# Patient Record
Sex: Female | Born: 1965 | Race: Black or African American | Hispanic: No | Marital: Married | Smoking: Current every day smoker
Health system: Southern US, Community
[De-identification: ages and names within clinical notes are randomized; demographics above are authoritative.]

## PROBLEM LIST (undated history)

## (undated) DIAGNOSIS — N189 Chronic kidney disease, unspecified: Secondary | ICD-10-CM

## (undated) DIAGNOSIS — I639 Cerebral infarction, unspecified: Secondary | ICD-10-CM

## (undated) DIAGNOSIS — E7800 Pure hypercholesterolemia, unspecified: Secondary | ICD-10-CM

## (undated) DIAGNOSIS — D219 Benign neoplasm of connective and other soft tissue, unspecified: Secondary | ICD-10-CM

## (undated) DIAGNOSIS — K219 Gastro-esophageal reflux disease without esophagitis: Secondary | ICD-10-CM

## (undated) DIAGNOSIS — Z87442 Personal history of urinary calculi: Secondary | ICD-10-CM

## (undated) DIAGNOSIS — R569 Unspecified convulsions: Secondary | ICD-10-CM

## (undated) DIAGNOSIS — D649 Anemia, unspecified: Secondary | ICD-10-CM

## (undated) DIAGNOSIS — R112 Nausea with vomiting, unspecified: Secondary | ICD-10-CM

## (undated) DIAGNOSIS — R011 Cardiac murmur, unspecified: Secondary | ICD-10-CM

## (undated) DIAGNOSIS — I82409 Acute embolism and thrombosis of unspecified deep veins of unspecified lower extremity: Secondary | ICD-10-CM

## (undated) DIAGNOSIS — Z9889 Other specified postprocedural states: Secondary | ICD-10-CM

## (undated) DIAGNOSIS — G709 Myoneural disorder, unspecified: Secondary | ICD-10-CM

## (undated) DIAGNOSIS — M1990 Unspecified osteoarthritis, unspecified site: Secondary | ICD-10-CM

## (undated) DIAGNOSIS — F419 Anxiety disorder, unspecified: Secondary | ICD-10-CM

## (undated) DIAGNOSIS — Z9289 Personal history of other medical treatment: Secondary | ICD-10-CM

## (undated) DIAGNOSIS — M549 Dorsalgia, unspecified: Secondary | ICD-10-CM

## (undated) DIAGNOSIS — G4730 Sleep apnea, unspecified: Secondary | ICD-10-CM

## (undated) DIAGNOSIS — I10 Essential (primary) hypertension: Secondary | ICD-10-CM

## (undated) HISTORY — PX: TUBAL LIGATION: SHX77

## (undated) HISTORY — PX: CARDIAC CATHETERIZATION: SHX172

## (undated) HISTORY — PX: INTERVENTIONAL RADIOLOGY PROCEDURE: SHX1837

## (undated) HISTORY — PX: CHOLECYSTECTOMY: SHX55

## (undated) HISTORY — PX: APPENDECTOMY: SHX54

---

## 2001-07-22 ENCOUNTER — Ambulatory Visit (HOSPITAL_COMMUNITY): Admission: RE | Admit: 2001-07-22 | Discharge: 2001-07-22 | Payer: Self-pay | Admitting: Internal Medicine

## 2001-07-22 ENCOUNTER — Encounter: Payer: Self-pay | Admitting: Internal Medicine

## 2003-11-01 ENCOUNTER — Emergency Department (HOSPITAL_COMMUNITY): Admission: EM | Admit: 2003-11-01 | Discharge: 2003-11-01 | Payer: Self-pay | Admitting: Emergency Medicine

## 2004-07-21 ENCOUNTER — Ambulatory Visit (HOSPITAL_COMMUNITY): Admission: RE | Admit: 2004-07-21 | Discharge: 2004-07-21 | Payer: Self-pay | Admitting: Obstetrics and Gynecology

## 2006-02-06 ENCOUNTER — Other Ambulatory Visit: Admission: RE | Admit: 2006-02-06 | Discharge: 2006-02-06 | Payer: Self-pay | Admitting: Obstetrics and Gynecology

## 2006-02-22 ENCOUNTER — Ambulatory Visit (HOSPITAL_COMMUNITY): Admission: RE | Admit: 2006-02-22 | Discharge: 2006-02-22 | Payer: Self-pay | Admitting: Obstetrics and Gynecology

## 2008-04-30 ENCOUNTER — Emergency Department (HOSPITAL_COMMUNITY): Admission: EM | Admit: 2008-04-30 | Discharge: 2008-04-30 | Payer: Self-pay | Admitting: Emergency Medicine

## 2008-05-21 ENCOUNTER — Inpatient Hospital Stay (HOSPITAL_COMMUNITY): Admission: EM | Admit: 2008-05-21 | Discharge: 2008-05-28 | Payer: Self-pay | Admitting: Internal Medicine

## 2008-05-21 ENCOUNTER — Encounter (INDEPENDENT_AMBULATORY_CARE_PROVIDER_SITE_OTHER): Payer: Self-pay | Admitting: Internal Medicine

## 2008-05-22 ENCOUNTER — Ambulatory Visit: Payer: Self-pay | Admitting: Cardiology

## 2008-05-22 ENCOUNTER — Encounter (INDEPENDENT_AMBULATORY_CARE_PROVIDER_SITE_OTHER): Payer: Self-pay | Admitting: Internal Medicine

## 2008-05-25 ENCOUNTER — Encounter (INDEPENDENT_AMBULATORY_CARE_PROVIDER_SITE_OTHER): Payer: Self-pay | Admitting: Internal Medicine

## 2008-05-26 ENCOUNTER — Encounter (INDEPENDENT_AMBULATORY_CARE_PROVIDER_SITE_OTHER): Payer: Self-pay | Admitting: Neurology

## 2008-07-30 ENCOUNTER — Ambulatory Visit (HOSPITAL_COMMUNITY): Admission: RE | Admit: 2008-07-30 | Discharge: 2008-07-30 | Payer: Self-pay | Admitting: Family Medicine

## 2008-08-17 ENCOUNTER — Ambulatory Visit (HOSPITAL_COMMUNITY): Admission: RE | Admit: 2008-08-17 | Discharge: 2008-08-18 | Payer: Self-pay | Admitting: Cardiology

## 2009-05-18 ENCOUNTER — Emergency Department (HOSPITAL_COMMUNITY): Admission: EM | Admit: 2009-05-18 | Discharge: 2009-05-18 | Payer: Self-pay | Admitting: Emergency Medicine

## 2009-05-27 ENCOUNTER — Inpatient Hospital Stay (HOSPITAL_COMMUNITY): Admission: AD | Admit: 2009-05-27 | Discharge: 2009-05-28 | Payer: Self-pay | Admitting: Psychiatry

## 2009-05-27 ENCOUNTER — Ambulatory Visit: Payer: Self-pay | Admitting: Psychiatry

## 2009-05-27 ENCOUNTER — Emergency Department (HOSPITAL_COMMUNITY): Admission: EM | Admit: 2009-05-27 | Discharge: 2009-05-27 | Payer: Self-pay | Admitting: Emergency Medicine

## 2009-05-31 ENCOUNTER — Other Ambulatory Visit (HOSPITAL_COMMUNITY): Admission: RE | Admit: 2009-05-31 | Discharge: 2009-06-15 | Payer: Self-pay | Admitting: Psychiatry

## 2009-06-01 ENCOUNTER — Ambulatory Visit: Payer: Self-pay | Admitting: Psychiatry

## 2009-07-29 ENCOUNTER — Ambulatory Visit: Payer: Self-pay | Admitting: Internal Medicine

## 2009-07-29 ENCOUNTER — Ambulatory Visit: Payer: Self-pay | Admitting: Vascular Surgery

## 2009-07-29 ENCOUNTER — Inpatient Hospital Stay (HOSPITAL_COMMUNITY): Admission: EM | Admit: 2009-07-29 | Discharge: 2009-07-30 | Payer: Self-pay | Admitting: Emergency Medicine

## 2009-07-29 ENCOUNTER — Encounter (INDEPENDENT_AMBULATORY_CARE_PROVIDER_SITE_OTHER): Payer: Self-pay | Admitting: Internal Medicine

## 2009-08-17 ENCOUNTER — Ambulatory Visit (HOSPITAL_COMMUNITY): Admission: RE | Admit: 2009-08-17 | Discharge: 2009-08-17 | Payer: Self-pay | Admitting: Family Medicine

## 2009-10-07 ENCOUNTER — Encounter (HOSPITAL_COMMUNITY): Admission: RE | Admit: 2009-10-07 | Discharge: 2009-11-06 | Payer: Self-pay | Admitting: Family Medicine

## 2009-11-09 ENCOUNTER — Encounter (HOSPITAL_COMMUNITY): Admission: RE | Admit: 2009-11-09 | Discharge: 2009-12-09 | Payer: Self-pay | Admitting: Family Medicine

## 2009-11-11 ENCOUNTER — Ambulatory Visit (HOSPITAL_COMMUNITY): Admission: RE | Admit: 2009-11-11 | Discharge: 2009-11-11 | Payer: Self-pay | Admitting: Family Medicine

## 2009-12-10 ENCOUNTER — Encounter (HOSPITAL_COMMUNITY)
Admission: RE | Admit: 2009-12-10 | Discharge: 2010-01-09 | Payer: Self-pay | Source: Home / Self Care | Admitting: Family Medicine

## 2010-08-14 ENCOUNTER — Encounter: Payer: Self-pay | Admitting: Neurology

## 2010-08-14 ENCOUNTER — Encounter: Payer: Self-pay | Admitting: Internal Medicine

## 2010-08-14 ENCOUNTER — Encounter: Payer: Self-pay | Admitting: Emergency Medicine

## 2010-10-09 LAB — HEMOGLOBIN A1C: Hgb A1c MFr Bld: 7.4 % — ABNORMAL HIGH (ref 4.6–6.1)

## 2010-10-09 LAB — CK TOTAL AND CKMB (NOT AT ARMC)
CK, MB: 1.2 ng/mL (ref 0.3–4.0)
Relative Index: INVALID (ref 0.0–2.5)
Total CK: 45 U/L (ref 7–177)

## 2010-10-09 LAB — DIFFERENTIAL
Basophils Absolute: 0 10*3/uL (ref 0.0–0.1)
Eosinophils Relative: 3 % (ref 0–5)
Lymphs Abs: 1.6 10*3/uL (ref 0.7–4.0)
Monocytes Relative: 7 % (ref 3–12)

## 2010-10-09 LAB — HOMOCYSTEINE: Homocysteine: 6.1 umol/L (ref 4.0–15.4)

## 2010-10-09 LAB — COMPREHENSIVE METABOLIC PANEL
Calcium: 9.3 mg/dL (ref 8.4–10.5)
Creatinine, Ser: 0.55 mg/dL (ref 0.4–1.2)
GFR calc non Af Amer: >60 mL/min (ref >60)
Glucose, Bld: 239 mg/dL — ABNORMAL HIGH (ref 70–99)
Potassium: 3.9 mEq/L (ref 3.5–5.1)
Sodium: 136 mEq/L (ref 135–145)
Total Bilirubin: 0.3 mg/dL (ref 0.3–1.2)
Total Protein: 6.9 g/dL (ref 6.0–8.3)

## 2010-10-09 LAB — TROPONIN I: Troponin I: 0.02 ng/mL (ref 0.00–0.06)

## 2010-10-09 LAB — PROTIME-INR: INR: 1.05 (ref 0.00–1.49)

## 2010-10-09 LAB — GLUCOSE, CAPILLARY
Glucose-Capillary: 125 mg/dL — ABNORMAL HIGH (ref 70–99)
Glucose-Capillary: 136 mg/dL — ABNORMAL HIGH (ref 70–99)
Glucose-Capillary: 178 mg/dL — ABNORMAL HIGH (ref 70–99)

## 2010-10-09 LAB — CBC
Hemoglobin: 11.1 g/dL — ABNORMAL LOW (ref 12.0–15.0)
Platelets: 244 10*3/uL (ref 150–400)

## 2010-10-09 LAB — LIPID PANEL
Triglycerides: 104 mg/dL (ref <150)
VLDL: 21 mg/dL (ref 0–40)

## 2010-10-26 LAB — URINE MICROSCOPIC-ADD ON

## 2010-10-26 LAB — DIFFERENTIAL
Basophils Absolute: 0 10*3/uL (ref 0.0–0.1)
Basophils Relative: 0 % (ref 0–1)
Eosinophils Absolute: 0.2 10*3/uL (ref 0.0–0.7)
Eosinophils Relative: 2 % (ref 0–5)
Lymphs Abs: 1.9 10*3/uL (ref 0.7–4.0)

## 2010-10-26 LAB — BASIC METABOLIC PANEL
BUN: 11 mg/dL (ref 6–23)
CO2: 26 mEq/L (ref 19–32)
Chloride: 108 mEq/L (ref 96–112)
Creatinine, Ser: 0.46 mg/dL (ref 0.4–1.2)
Glucose, Bld: 170 mg/dL — ABNORMAL HIGH (ref 70–99)
Potassium: 4 mEq/L (ref 3.5–5.1)

## 2010-10-26 LAB — CBC
HCT: 33.2 % — ABNORMAL LOW (ref 36.0–46.0)
MCHC: 32.6 g/dL (ref 30.0–36.0)
MCV: 81.3 fL (ref 78.0–100.0)
Platelets: 274 10*3/uL (ref 150–400)
RDW: 15.9 % — ABNORMAL HIGH (ref 11.5–15.5)

## 2010-10-26 LAB — RAPID URINE DRUG SCREEN, HOSP PERFORMED
Amphetamines: NOT DETECTED
Barbiturates: NOT DETECTED
Opiates: NOT DETECTED
Tetrahydrocannabinol: NOT DETECTED

## 2010-10-26 LAB — URINALYSIS, ROUTINE W REFLEX MICROSCOPIC
Nitrite: NEGATIVE
Specific Gravity, Urine: 1.025 (ref 1.005–1.030)
Urobilinogen, UA: 1 mg/dL (ref 0.0–1.0)
pH: 6 (ref 5.0–8.0)

## 2010-10-26 LAB — POCT PREGNANCY, URINE: Preg Test, Ur: NEGATIVE

## 2010-10-26 LAB — GLUCOSE, CAPILLARY

## 2010-10-27 LAB — CBC
HCT: 33.5 % — ABNORMAL LOW (ref 36.0–46.0)
Hemoglobin: 11 g/dL — ABNORMAL LOW (ref 12.0–15.0)
MCHC: 32.9 g/dL (ref 30.0–36.0)
RBC: 4.12 MIL/uL (ref 3.87–5.11)

## 2010-10-27 LAB — COMPREHENSIVE METABOLIC PANEL
ALT: 18 U/L (ref 0–35)
Alkaline Phosphatase: 63 U/L (ref 39–117)
CO2: 24 mEq/L (ref 19–32)
Calcium: 9 mg/dL (ref 8.4–10.5)
GFR calc non Af Amer: >60 mL/min (ref >60)
Glucose, Bld: 142 mg/dL — ABNORMAL HIGH (ref 70–99)
Sodium: 136 mEq/L (ref 135–145)

## 2010-10-27 LAB — DIFFERENTIAL
Basophils Relative: 0 % (ref 0–1)
Eosinophils Absolute: 0.3 10*3/uL (ref 0.0–0.7)
Lymphs Abs: 1.7 10*3/uL (ref 0.7–4.0)
Neutrophils Relative %: 72 % (ref 43–77)

## 2010-10-27 LAB — GLUCOSE, CAPILLARY: Glucose-Capillary: 152 mg/dL — ABNORMAL HIGH (ref 70–99)

## 2010-11-07 LAB — URINALYSIS, ROUTINE W REFLEX MICROSCOPIC
Glucose, UA: NEGATIVE mg/dL
Specific Gravity, Urine: >1.046 — ABNORMAL HIGH (ref 1.005–1.030)
pH: 6.5 (ref 5.0–8.0)

## 2010-11-07 LAB — COMPREHENSIVE METABOLIC PANEL
AST: 19 U/L (ref 0–37)
Albumin: 3.5 g/dL (ref 3.5–5.2)
Calcium: 9.6 mg/dL (ref 8.4–10.5)
Creatinine, Ser: 0.47 mg/dL (ref 0.4–1.2)
GFR calc Af Amer: >60 mL/min (ref >60)
Total Protein: 7 g/dL (ref 6.0–8.3)

## 2010-11-07 LAB — CBC
MCHC: 32.9 g/dL (ref 30.0–36.0)
RBC: 3.46 MIL/uL — ABNORMAL LOW (ref 3.87–5.11)
WBC: 7.1 10*3/uL (ref 4.0–10.5)

## 2010-11-07 LAB — GLUCOSE, CAPILLARY
Glucose-Capillary: 124 mg/dL — ABNORMAL HIGH (ref 70–99)
Glucose-Capillary: 127 mg/dL — ABNORMAL HIGH (ref 70–99)
Glucose-Capillary: 70 mg/dL (ref 70–99)
Glucose-Capillary: 87 mg/dL (ref 70–99)

## 2010-11-07 LAB — URINE CULTURE

## 2010-11-07 LAB — URINE MICROSCOPIC-ADD ON

## 2010-11-07 LAB — BASIC METABOLIC PANEL
CO2: 23 mEq/L (ref 19–32)
Calcium: 8.9 mg/dL (ref 8.4–10.5)
Creatinine, Ser: 0.62 mg/dL (ref 0.4–1.2)
GFR calc Af Amer: >60 mL/min (ref >60)
GFR calc non Af Amer: >60 mL/min (ref >60)
Sodium: 136 mEq/L (ref 135–145)

## 2010-12-06 NOTE — Discharge Summary (Signed)
Jill [name], CASTELLANOS NO.:  [id number]   MEDICAL RECORD NO.:  [id number]          PATIENT TYPE:  INP   LOCATION:  3712                         FACILITY:  [facility]   PHYSICIAN:  Lonia Blood, M.D.      DATE OF BIRTH:  02-May-1966   DATE OF ADMISSION:  05/21/2008  DATE OF DISCHARGE:  05/28/2008                               DISCHARGE SUMMARY   PRIMARY CARE PHYSICIAN:  [name], MD   DISCHARGE DIAGNOSES:  1. Acute cerebrovascular accident with a shower cerebrovascular      accident involving the right middle cerebral artery.  2. Bilateral internal carotid stenosis, right more than left.  The      patient had 85% stenosis on the right and 50-60% on the left.  3. Right to left shunt most likely patent foramen ovale.  4. Uncontrolled diabetes.  5. Hypertension.  6. Dyslipidemia.  7. Obesity.  8. Tobacco abuse.  9. Transient bronchitis.   DISCHARGE MEDICATIONS:  1. Lantus insulin 32 units subcutaneously nightly.  2. NovoLog insulin 5 units subcutaneously t.i.d. at mealtimes.  3. Zocor 40 mg p.o. nightly.  4. Hydrochlorothiazide 25 mg p.o. daily.  5. Benicar 40 mg daily.  6. Metformin is discontinued.  7. Aspirin 325 mg daily.  8. Actos 45 mg daily.  9. Nitroglycerin sublingual as needed.   DISPOSITION:  The patient will be discharged today.  She will have a  followup with Dr. Corliss Skains and will likely participate in study  comparing stenting versus medical treatment for her carotid stenosis.  The patient agrees to participate in the study.  She will also follow  with Dr. [name] for continued adjustment of her insulin.  She has  had extensive counseling on diabetic control as well as tobacco control.   Procedures performed this admission include:  A head CT without contrast on May 21, 2008, that showed newly  scattered foci of abnormal low density in the hemispheric white matter.  They are more numerous on the right than the left.  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  A followup venous Doppler of the lower extremity  showed no evidence of DVT in the left lower extremity.  MRI and MRA of  the brain on May 22, 2008, showed chain of discrete acute  infarctions affecting the right hemisphere in a watershed territory  running from the front to the back.  Acute infarction was also affecting  the midportion of the corpus callosum, but no acute infarction involving  the brainstem, cerebellum, for left cerebral hemisphere.  There was few  old small vessel insults affecting the left hemisphere.  A chest x-ray  on May 24, 2008, showed no acute disease.  CT angiogram of the head  as well as the neck showed severe bilateral supraglenoid ICA stenosis,  75-90% on the right and 50-75% on the left, but no extension to the  right.  A TEE performed by Dr. 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  In addition, it also  showed trace TR, but no clots in the  left atrium or left ventricle.  There was some Valsalva strain also associated with the right to left  shunting, but strongly positive.   Interventional Radiology:  Four-vessel cerebral arteriogram performed by  Dr. Corliss Skains on May 27, 2008, showed 85% stenosis to the right ICA  distal to cavernosus at supraglenoid segment.  There was 50% stenosis of  the left ACA A1 segment.  There was 50-60% stenosis of the left ICA  distal to the cavernosus at supraglenoid segment, but no acute  complications after the procedure.   CONSULTATIONS:  1. Stroke team, Dr. Delia Heady.  2. Dr. Cristy Hilts. Jacinto Halim, [facility].  3. Dr. Corliss Skains, Interventional Radiology.   BRIEF HISTORY AND PHYSICAL:  Please refer to dictated history and  physical on admission by Dr. Karren Burly.  In short however, the  patient is a 45 year old African American female with history of  uncontrolled diabetes, hypertension, and tobacco abuse  who presented  with 2-week history of lower extremity weakness and mild left upper  extremity spasm.  The patient has been declining ability to walk and was  in need of increasing effort.  She has had some tripping, but not  actually falling.  She has had one episode of diarrhea prior to arrival.  Her exam then was unremarkable, but her CT scan showed the findings  suggestive of possible MS, hence she was admitted.   HOSPITAL COURSE:  1. Acute watershed infarct.  The patient was admitted for neurologic      evaluation.  Initial thinking was this may be MS however, after the      MRI confirmed that she had a watershed infarct, this was worked      effectively.  At this point, the patient will have PT/OT.  She has      received counseling and the findings above indicate that she must      have had this shower infarct from her carotid stenosis.  She is      going to have that taken care through the study either medical      therapy or stenting.  2. Diabetes.  The patient has been noncompliant reportedly with her      medications.  In the hospital, however, she received a combination      of long-acting and short-acting insulin.  She is currently more      acutely aware and willing to comply with all treatment and we will      discharge her on both long-acting and short-acting insulin as well      as Actos.  3. Dyslipidemia.  The patient's blood fasting lipid panel in the      hospital showed dyslipidemia.  Her total cholesterol was 225,      triglyceride 211, HDL is only 23, LDL 160.  In the setting of her      CVA and carotid stenosis, she was started on statin.  She is      currently on Zocor 40 mg.  4. Tobacco abuse.  She was counseled in the hospital as well as given      nicotine patch.  The patient is willing to quit.  Otherwise, the      patient is currently stable and ready to be discharged.      Lonia Blood, M.D.  Electronically Signed     LG/MEDQ  D:  05/28/2008  T:   05/28/2008  Job:  [id number]   cc:   [name], M.D.

## 2010-12-06 NOTE — Cardiovascular Report (Signed)
NAMETABATHIA, KNOCHE NO.:  [id number]   MEDICAL RECORD NO.:  [id number]          PATIENT TYPE:  INP   LOCATION:  2507                         FACILITY:  [facility]   PHYSICIAN:  Cristy Hilts. Jacinto Halim, MD       DATE OF BIRTH:  [birth year]   DATE OF PROCEDURE:  DATE OF DISCHARGE:                            CARDIAC CATHETERIZATION   PROCEDURES PERFORMED:  1. Abdominal aortogram.  2. Crossover from the right into the left common iliac artery and      placement of the catheter tip into the left common iliac artery.  3. Crossover from the right femoral artery into the left and placement      of the catheter tip in the left external iliac artery.  4. Access of the left femoral artery.  5. Crossover into the right iliac artery and placement of the catheter      tip into the right common femoral artery.  6. Percutaneous transluminal angioplasty and balloon angioplasty and      stenting of the right superficial femoral artery.  7. Balloon angioplasty and stenting of the right common iliac artery.   INDICATION:  Ms. 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  Given this, she had  undergone outpatient lower extremity arterial Dopplers, which had  revealed high-grade stenosis of the right common iliac artery.  Given  that, she is now brought to the catheterization lab to reevaluate her  peripheral anatomy.   Abdominal aortogram done.  Aortogram revealed presence of 2 renal  arteries, one on either side.  They are widely patent.  The aortoiliac  bifurcation was widely patent.   The right iliac artery with femoral runoff:  Right iliac artery with  femoral runoff revealed an 80% focal stenosis in the right common iliac  artery.   The right superficial femoral artery:  Right superficial femoral  artery  just below the right hip also had a 90% stenosis followed by a tandem 40-  50% stenosis.  There was 3-vessel runoff noted below the right knee.   Left femoral arteriogram with distal runoff:  Left femoral arteriogram  with distal runoff revealed minimal disease in the left superficial  femoral artery followed by 3-vessel runoff.   INTERVENTION DATA:  Successful PTA and stenting of the right superficial  femoral artery with implantation of a 7.0 x 14 mm SMART self-expanding  stent.  Stenosis was reduced from 90% to 0% with brisk flow.   Successful PTA and stenting of the right common iliac artery with  implantation of a 10.0 x 40 mm self-expanding SMART stent.  The stenosis  was reduced from 80% to 0% with brisk flow.   RECOMMENDATIONS:  The patient will need continued aggressive risk  modification.  I expect significant improvement in her symptoms of  claudication.  She will follow up on an outpatient basis.   A total of 250 mL of contrast was utilized for diagnostic and  interventional procedure.   TECHNIQUE OF THE PROCEDURE:  Under usual sterile precautions, using a 5-  French right femoral arterial access, a 5-French OMNI flush catheter was  advanced into abdominal aorta, and abdominal aortogram was performed.  Abdominal aortogram with bifemoral runoff was also performed.   Because of difficulty in accessing, the left femoral artery, as there  was a right iliac artery stenosis and also right superficial femoral  artery stenosis, which needed contralateral approach, I engaged the OMNI  flush catheter into the left external iliac artery placed over the  Versacore wire, and angiography of the left iliac artery and femoral  artery was performed.  Under fluoroscopic guidance, I was able to obtain  the access of the left femoral artery.  A Terumo crossover sheath was  advanced into the left femoral artery over the Versacore wire.  Having  performed this, using the same OMNI  flush catheter coming from the left  groin and entering into the right groin, I was able to crossover into  the right iliac artery and the Terumo catheter was advanced into the  right common iliac artery and right iliac arteriogram with distal runoff  was performed.  Then intervention was performed using heparin for  anticoagulation.  Since the ACT never went up beyond 200 in spite of  giving a total of 8000 units of heparin.  However, I was able to quickly  perform the angioplasty.  A 6.0 x 20 mm Power balloon was utilized and  right common iliac artery angioplasty was performed at 10 atmospheric  pressure and the right superficial femoral artery angioplasty was  performed at 4 atmospheric pressure.  There was dissection noted in the  right superficial femoral artery and residual stenosis in the right  common iliac artery.  Hence, we decided to stent this.  The superficial  femoral artery was stented with a 7.0 x 14 mm SMART stent followed by  postdilatation with the same 6.0 x 20 mm PowerSail balloon was utilized  and angioplasty at 10 atmospheric pressure of right superficial femoral  artery was performed.  Excellent results were noted and then attention  was directed towards the right common iliac artery, in which a 10.0 x 40  mm SMART self-expanding stent was introduced and placed into the  position avoiding the internal artery and deployed.  This stent was  postdilated with a 10.0 x 20 mm power balloon at 10 atmospheric pressure  x2 and angiography was repeated.  The wire was withdrawn, and the sheath  was pulled back into the abdominal aorta and a OMNI flush catheter was  re-advanced back into the distal abdominal aorta and abdominal aortogram  was performed to evaluate the iliac stenosis.  Excellent result with  brisk flow was noted.  Having performed this, the wire was withdrawn  after after exchanging the long sheath to a short left 7-French sheath.  The patient tolerated the  procedure well.  No immediate complications  were noted.      Cristy Hilts. Jacinto Halim, MD  Electronically Signed     JRG/MEDQ  D:  08/17/2008  T:  08/18/2008  Job:  [id number]   cc:   [name], M.D.

## 2010-12-06 NOTE — Consult Note (Signed)
NAMEJOETTA, [name] NO.:  [id number]   MEDICAL RECORD NO.:  [id number]          PATIENT TYPE:  OBV   LOCATION:  3712                         FACILITY:  [facility]   PHYSICIAN:  Noel Christmas, MD    DATE OF BIRTH:  28-Jan-1966   DATE OF CONSULTATION:  05/24/2008  DATE OF DISCHARGE:                                 CONSULTATION   REFERRING PHYSICIAN:  Demetria Pore. Belford, MD   REASON FOR CONSULTATION:  Multiple watershed cerebral infarctions.   HISTORY OF PRESENT ILLNESS:  This 45 year old lady who presented with a  history of progressive lower extremity weakness for about 2 weeks  involving left lower extremity more so than the right.  Symptoms were  somewhat stepwise in progression.  Her most recent exacerbation was on  May 18, 2008.  She has noticed difficultly with walking and has had  to make a concerted effort to look at her legs and feet when she walks  to avoid falling.  Others around her including family members noticed  changes in her walking.  She has had no associated symptoms involving  upper extremities, although she has had some intermittent feeling of  weakness in left upper extremity occasionally, but not simultaneously  with lower extremity symptoms.  MRI of her brain on May 22, 2008,  showed watershed distribution of a chain or small infarctions of the  junction of the right middle and anterior cerebral vascular territories  in addition to small acute infarction involving the middle of the corpus  callosum.  Older subcortical small infarcts were noted involving the  left hemisphere.  The patient had a 2-D echocardiogram on May 22, 2008, which was unremarkable.  She was on aspirin prior to  hospitalization.  Lovenox has been added.  There has been no progression  of symptoms since admission.   PAST MEDICAL HISTORY:  Remarkable for hypertension, hyperlipidemia, and  diabetes mellitus.   CURRENT MEDICATIONS:  1. Actos.  2. Glucophage.  3. Benicar.  4. Hydrochlorothiazide.  5. Aspirin.  6. Lovenox subcu.  7. Protonix 40 mg per day.  8. Benicar 20 mg per day.  9. Senokot-S 1 per day.  10.Insulin per sliding scale.  11.Nicotine patch 7 mg per day.  12.Insulin Lantus 10 units per day.   FAMILY HISTORY:  Positive for heart disease involving the father as well  as stroke involving paternal grandmother.  Her mother has had problems  with kidney stones and has renal failure as a result.   PHYSICAL EXAMINATION:  GENERAL:  She is an obese, middle-aged appearing  lady who is alert and cooperative and in no acute distress.  She is well  oriented to time as well as place.  Short-term and long-term memory were  normal.  Affect was appropriate.  HEENT:  Pupils were equal and reactive normally to light.  Extraocular  movements were full and conjugate.  Visual fields were intact and  normal.  There was no facial numbness and no facial weakness.  Hearing  was normal.  Speech and mouth movement were normal.  Coordination of  extremities was normal.  Motor exam showed moderate hip flexor weakness  on the left.  Strength in hamstrings and quadriceps was normal.  Distal  left lower extremity strength was normal as well.  Strength of the right  lower extremity and both upper extremities was normal.  Muscle tone was  normal throughout.  Deep tendon reflexes were 1+ in the upper  extremities and absent at the knees and ankles.  Plantar responses were  flexor.  Sensory examination was normal.  Carotid auscultation was  normal.   CLINICAL IMPRESSION:  1. Multiple watershed (middle cerebral artery/anterior cerebral      artery) small acute cerebral infarctions as well as a small corpus      callosum and acute cerebral infarction.  Embolus source is      suspected.  As well, a hemodynamically significant proximal carotid      artery lesion will also need to be ruled out.  2. Older small left subcortical cerebral infarctions (most likely       secondary to small vessel disease from diabetes and hypertension).      The embolic phenomenon cannot be ruled out as well, however.  3. Rule out patent foramen ovale.   RECOMMENDATIONS:  1. Carotid duplex ultrasound study, if not done on May 22, 2008,      (report unavailable).  2. TEE as planned.  3. Continue Lovenox and aspirin for now.  4. Physical therapy consult for evaluation of the patient's gait and      recommendations for ambulatory safety.   Thank you for asking me to evaluate [name].      Noel Christmas, MD  Electronically Signed     CS/MEDQ  D:  05/24/2008  T:  05/25/2008  Job:  [id number]

## 2010-12-06 NOTE — H&P (Signed)
NAMEZAYLAH, BLECHA NO.:  [id number]   MEDICAL RECORD NO.:  [id number]          PATIENT TYPE:  OBV   LOCATION:  3712                         FACILITY:  [facility]   PHYSICIAN:  Vinnie Level, MD    DATE OF BIRTH:  Apr 09, 1966   DATE OF ADMISSION:  05/21/2008  DATE OF DISCHARGE:                              HISTORY & PHYSICAL   CHIEF COMPLAINT:  Extremity weakness.   HISTORY OF PRESENT ILLNESS:  A 45 year old African American female with  history of diabetes, hypertension, tobacco use presents with  approximately 2-week history of lower extremity weakness and mild left  upper extremity spasm.  She notes she has been able to walk, but has  required increased effort and has occasionally tripped, but not actually  fallen.  She notes that it is worse in her left greater the right knee,  primarily started weakness with a sense that her knees are about to give  out.  She first noticed this about 2 weeks ago which correlates with her  husband and her daughter who were present at the bedside and notes it  slightly improved and then recurred again yesterday.  She notes it is  not necessarily in association of her hand symptoms with her lower  extremity symptoms.  She does note she has had an onset of diarrhea  since early September, but is otherwise without complaint, has been in  her usual state of health.  She denies any chest pain, shortness of  breath, or diaphoresis.   PAST MEDICAL HISTORY:  Notable for hypertension, diabetes, cesarean  section, remote cholecystectomy, and endometriosis.   MEDICATIONS:  She is unclear for doses of her home meds, but they  include:  1. Actos.  2. Glucophage.  3. Benicar.  4. Hydrochlorothiazide.  5. Aspirin.   ALLERGIES:  No known drug allergies.   FAMILY HISTORY:  She notes both her mother and father had heart attacks  in their 11s.   SOCIAL HISTORY:  She notes she smokes at least one pack per day.  Denies  alcohol or drug  use.  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  She is  asking to leave at the present time to go smoke.   REVIEW OF SYSTEMS:  As per HPI.  Full 14-point review of systems was  completed, otherwise within normal limits.  Notably, she notes no recent weight loss, weight gain, and no other  neurologic symptoms.   PHYSICAL EXAMINATION:  VITAL SIGNS: Temperature 98.3, pulse 97, blood  pressure 137/81, and respiratory rate 18.  GENERAL:  No acute distress, alert and oriented.  HEENT:  Normocephalic and atraumatic.  Pupils are equal, round, and  reactive to light and  accommodation.  Extraocular muscles intact.  Mucous membranes moist.  NECK:  No JVD, no bruit, no thyromegaly.  LYMPHATIC:  No cervical lymphadenopathy.  CARDIOVASCULAR:  Regular rate rhythm.  Dorsalis pedis 2+ posterior  tibial pulses.  Radial pulses were 2+ bilaterally.  CHEST:  Clear to auscultation bilaterally.  ABDOMEN:  Markedly obese, nontender, and nondistended.  Normoactive  bowel sounds.  EXTREMITIES:  No clubbing, cyanosis, or edema.  MUSCULOSKELETAL:  Full range of motion.  No deformities.  Normal rapid  alternating movements.  NEUROLOGIC:  She is alert and oriented x4.  Cranial nerves II through  XII intact. She has got 5/5 strength x4.  SKIN:  Clean, dry, and intact.  No rash.   EKG reveals normal sinus rhythm at a rate of 82, no ST or T wave  changes.   LABORATORY DATA:  White count 10.0, hemoglobin 13.1, and platelet count  332.  Sodium is 136, potassium 3.9, chloride 99, bicarb 26, BUN 14,  creatinine 0.5, and glucose 261.  Urinalysis reveals greater than a gram  of glucose in her urine.  Head CT reveals multiple low-density foci in  the right greater than left, white matter most concerning for a mass.   ASSESSMENT AND PLAN:  A 45 year old white female with diabetes,  hypertension, tobacco use presents with lower extremity weakness, worse  on the left than her right and changes on head CT concerning  for  multiple sclerosis.  Differential includes embolic for his metastatic  phenomena.  1. Weakness.  Concern for multiple sclerosis.  Neurologic consult in      the morning.  MRI with and without contrast.  We will rule out      other causes of this including embolic phenomenon with the      aforementioned MRI as well as a surface echocardiogram and carotid      transcranial Dopplers of her neck.  2. We will continue her on the aspirin that she is on.  3. Diabetes.  Actos and sliding scale.  We will hold her metformin.      We will check hemoglobin A1c.  4. Hypertension.  Continue with her Benicar and hydrochlorothiazide.  5. Check a fasting lipid profile.  6. Tobacco use.  Encouraged cessation.  I talked with her at length      about this.  7. Prophylaxis.  Lovenox.      Vinnie Level, MD  Electronically Signed     PMB/MEDQ  D:  05/22/2008  T:  05/22/2008  Job:  [id number]

## 2010-12-09 NOTE — Discharge Summary (Signed)
Jill [name], Jill [name] NO.:  [id number]   MEDICAL RECORD NO.:  [id number]          PATIENT TYPE:  INP   LOCATION:  2507                         FACILITY:  [facility]   PHYSICIAN:  Cristy Hilts. Jacinto Halim, MD       DATE OF BIRTH:  June 16, 1966   DATE OF ADMISSION:  08/17/2008  DATE OF DISCHARGE:  08/18/2008                               DISCHARGE SUMMARY   DISCHARGE DIAGNOSES:  1. Right hip claudication.  2. Peripheral vascular disease status post pulmonary vein angiogram      revealing 80% iliac artery stenosis on the right and right      superficial femoral artery stenosis with subsequent stenting of      both lesions by Dr.  Yates Decamp on August 17, 2008.  3. Carotid artery disease status post four-vessel cerebral angiography      showing a 85% stenosis of the right petrous part of the internal      carotid artery, stenosis 50-60% on the left.  4. History of cerebrovascular accident with shower of cerebral emboli      involving the right middle cerebral artery and also the left      hemisphere.  5. Patent foramen ovale with a positive right to left shunting by      contrast injections by transesophageal echocardiography.  6. Insulin-dependent diabetes mellitus.  7. Hyperlipidemia.  8. Obesity.   HOSPITAL COURSE:  Mr. Coralyn Roselli was seen in the office by Dr. Jacinto Halim  where she was complaining about claudication.  She was sent for Dopplers  which showed a marked reduction in the ABI on the right, thus he  recommended for her to undergo PV angiogram.  She came in on August 17, 2008, was found to have high-grade stenosis in the right internal iliac  and the right SFA.  She underwent stenting to both these lesions.  The  following day, August 18, 2008, postprocedure, she was doing well.  Her  blood pressure was 101/37.  Labs were all normal.  Hemoglobin 10.2,  hematocrit 31.1, WBC 7.1, and platelets 203.  Her sodium was 136,  potassium 3.6, BUN 17, creatinine 0.62, glucose  143.  She was seen by  Dr. Jacinto Halim, considered stable for discharge home.   DISCHARGE MEDICATIONS:  1. Hydrochlorothiazide 25 mg a day.  2. Benicar 40 mg a day.  3. Aspirin 325 mg a day.  4. Actos 45 mg a day.  5. Crestor 20 mg in the evening.  6. Lantus 50 units in the evening.  7. Plavix 75 mg a day.  8. Lasix 20 mg a day.  9. Insulin sliding scale as prior to hospitalization.   DISCHARGE INSTRUCTIONS:  She will follow up in the office for lower  extremity Dopplers and then followup with Dr. Jacinto Halim.      Lezlie Octave, N.P.      Cristy Hilts. Jacinto Halim, MD  Electronically Signed    BB/MEDQ  D:  09/16/2008  T:  09/17/2008  Job:  [id number]   cc:   Pramod P. Pearlean Brownie, MD  [name], M.D.

## 2010-12-09 NOTE — H&P (Signed)
Jill [name], Jill [name] NO.:  [id number]   MEDICAL RECORD NO.:  [id number]          PATIENT TYPE:  AMB   LOCATION:  DAY                           FACILITY:  [facility]   PHYSICIAN:  Tilda Burrow, M.D. DATE OF BIRTH:  1966-01-02   DATE OF ADMISSION:  DATE OF DISCHARGE:  LH                                HISTORY & PHYSICAL   ADMITTING DIAGNOSES:  1.  Menorrhagia.  2.  Uterine fibroid, 14-16 weeks size, requiring endometrial ablation.  3.  Type 2 diabetes mellitus, suboptimal control.   HISTORY OF PRESENT ILLNESS:  This 45 year old obese female is admitted at  this time for hysteroscopy, D&C, endometrial ablation.  Jill [name] has been  a patient of ours and has been seen several times over the past 2 years for  gyn concerns, primarily addressing uterine fibroids.  She has described her  periods as very heavy, requiring both pads and tampons with sometimes hourly  changes during her days of peak flow.   Menses are described as very heavy with great size clots, lasting 4-5 days  per cycle.  Her LMP was July 10, 2004.  She is requesting endometrial  ablation at this time.  We recently did a vaginal ultrasound to assess the  endometrial stripe.  She has an enlarged uterus, 13.8 cm in length, 9.8 cm  EP depth with 9.8 cm transverse uterine width, estimating weight of  approximately 614 g based on ultrasound calculations today.  Previous  estimate was 472 g last year.  I have made it clear to Jill [name] that the  excess uterine size reduces the potential for successful endometrial  ablation; however, Jill [name] is strongly desirous of attempting  endometrial ablation before considering heading toward hysterectomy as a  solution for heavy periods.  We have discussed this on multiple episodes,  have gone over considerations of hysterectomy, and she is aware that if  there is a failure within the year, we will realize it and need to proceed  toward hysterectomy.   Technical complications of the procedure including  uterine perforation, inadequate successful endometrial ablation, and usual  surgical risks of someone her weight, age, and diabetic tendencies have been  discussed with the patient.  After a thorough discussion of risks and  benefits, she desires to proceed with endometrial ablation to attempt to  control menses.   PAST MEDICAL HISTORY:  1.  Type 2 diabetes mellitus, under the care of Dr. [name] and Dr.      Reather Littler, endocrinologists in [place].  Most recently she has lost      some weight and taken herself off all her antidiabetic agents.  Over the      Christmas holiday, she has done a poor job of self-discipline and has      noted her blood sugars going up.  Hemoglobin A1c was 7.3 when checked      during preop labs.  Glucose was 177.  Additional labs include BUN 5,      creatinine 0.4.  Prior evaluation by Dr. Lucianne Muss in 2004, showed  microalbumin in the urine, though there was some question of menstrual      contamination in the sample.  2.  Positive for hypercholesterolemia, previously treated, though not      currently being taken.  3.  History of dependent edema, not currently active.   SURGICAL HISTORY:  1.  Tubal ligation years ago.  2.  C-sections in '86 and '88.  3.  Cholecystectomy in 1977.  4.  Endometrioma removed in 1997.   MEDICATIONS DAY SURGERY LIST:  She has recently been treated for inguinal  folliculitis and had incision and drainage of an inguinal abscess.  She has  discontinued her anticholesterol and diabetes medications.   PHYSICAL EXAMINATION:  VITAL SIGNS:  Weight 254, blood pressure 126/78,  pulse 78 and regular.  GENERAL EXAM:  Healthy, overweight, African-American female, alert and  oriented x 3.  HEENT:  Pupils equal, round, and reactive.  NECK:  Supple.  Trachea midline.  CHEST:  Clear to auscultation.  ABDOMEN:  Nontender.  EXTERNAL GENITALIA:  Multiparous.  Menstrual flow  present.  Uterus anterior,  upper limits of normal size, Pap smear due to be repeated AICUS, last Pap.  The uterus is well supported.  EXTREMITIES:  Grossly normal without cyanosis, clubbing, or edema at this  time.   IMPRESSION:  1.  Menorrhagia.  2.  Uterine fibroids.  3.  Mild anemia secondary to menometrorrhagia.  4.  Type 2 diabetes, suboptimal control.   PLAN:  Hysteroscopy, D&C, endometrial ablation on July 21, 2004.     John   JVF/MEDQ  D:  07/20/2004  T:  07/20/2004  Job:  [id number]   cc:   [name], M.D.  [address]  [place]  [place] [address]  Fax: [phone]   Reather Littler, M.D.  [address], [address]  [place]  [place] [address]  Fax: [phone]

## 2010-12-09 NOTE — Op Note (Signed)
Jill [name], ARKIN NO.:  [id number]   MEDICAL RECORD NO.:  [id number]          PATIENT TYPE:  AMB   LOCATION:  DAY                           FACILITY:  [facility]   PHYSICIAN:  Tilda Burrow, M.D. DATE OF BIRTH:  [birth year]   DATE OF PROCEDURE:  07/21/2004  DATE OF DISCHARGE:                                 OPERATIVE REPORT   PREOPERATIVE DIAGNOSES:  1.  Menorrhagia.  2.  Uterine fibroids.   POSTOPERATIVE DIAGNOSES:  1.  Menorrhagia.  2.  Uterine fibroids.   PROCEDURE:  Hysteroscopy, dilation and curettage, endometrial ablation.   SURGEON:  Tilda Burrow, M.D.   ASSISTANTAmie Critchley, C.S.T.   ANESTHESIA:  General.   COMPLICATIONS:  None.   FINDINGS:  Uterus sounds to 12 cm.   DETAILS OF PROCEDURE:  The patient was taken to the operating room and  prepped and draped for a vaginal procedure.  With cervix grasped with a  single-tooth tenaculum, the uterus sounded to 12 cm.  Dilated to 61 [place],  allowing introduction of the HTA direct rigid hysteroscope.  The uterus was  visualized and a generous amount of endometrial tissue was present.  Curettage was then performed, obtaining generous endometrial tissue and  blood.  The patient then had reintroduction of the HTA endometrial  hysteroscope and we visualized the uterine fundus, which had a fundal  fibroid on the patient's left side near the top of the uterus.  We were able  to visualize the endometrial cavity and saw no abnormalities.  The heat  cycle was initiated and the 10-minute HTA endometrial ablation thermal  sequence initiated.  Fluid pressures remained stable.  There was no evidence  of fluid loss.  The procedure was completed with good visual thermal changes  of the interior surfaces of the uterine cavity.  The procedure was  considered a success.  Paracervical block with Marcaine with epinephrine 10  mL total dose was applied and the patient allowed to go to the recovery room  in good  condition.   ADDENDUM:  The patient's blood sugar was 222 fasting this morning.  Hemoglobin A1C was 7.9 in preop laboratory work.  The patient will be  referred to [name], M.D., to resume her prior diabetic medications  and begin the weight loss process.     John   JVF/MEDQ  D:  07/21/2004  T:  07/21/2004  Job:  [id number]   cc:   [name], M.D.  [address]  [place]  [place] [address]  Fax: [phone]

## 2011-04-25 LAB — CBC
HCT: 39.3
HCT: 37.5
Hemoglobin: 13.1
Hemoglobin: 12.1
MCHC: 33.2
MCHC: 32.4
MCV: 83.7
MCV: 84.5
Platelets: 332
RBC: 4.7
RBC: 4.43
RDW: 13.5
RDW: 14.4
WBC: 10

## 2011-04-25 LAB — COMPREHENSIVE METABOLIC PANEL
ALT: 9
AST: 22
Albumin: 4
Alkaline Phosphatase: 88
BUN: 14
CO2: 26
Calcium: 9.8
Chloride: 99
Creatinine, Ser: 0.5
GFR calc Af Amer: >60
GFR calc non Af Amer: >60
Glucose, Bld: 261 — ABNORMAL HIGH
Potassium: 3.9
Sodium: 136
Total Bilirubin: 0.4
Total Protein: 7.4

## 2011-04-25 LAB — GLUCOSE, CAPILLARY
Glucose-Capillary: 253 — ABNORMAL HIGH
Glucose-Capillary: 284 — ABNORMAL HIGH
Glucose-Capillary: 297 — ABNORMAL HIGH
Glucose-Capillary: 361 — ABNORMAL HIGH
Glucose-Capillary: 178 — ABNORMAL HIGH
Glucose-Capillary: 196 — ABNORMAL HIGH
Glucose-Capillary: 196 — ABNORMAL HIGH
Glucose-Capillary: 198 — ABNORMAL HIGH
Glucose-Capillary: 233 — ABNORMAL HIGH
Glucose-Capillary: 242 — ABNORMAL HIGH
Glucose-Capillary: 274 — ABNORMAL HIGH
Glucose-Capillary: 277 — ABNORMAL HIGH
Glucose-Capillary: 321 — ABNORMAL HIGH

## 2011-04-25 LAB — CARDIAC PANEL(CRET KIN+CKTOT+MB+TROPI)
CK, MB: 0.3
CK, MB: 0.5
Relative Index: INVALID
Relative Index: INVALID
Total CK: 33
Total CK: 39
Troponin I: <0.01
Troponin I: <0.01

## 2011-04-25 LAB — URINALYSIS, ROUTINE W REFLEX MICROSCOPIC
Bilirubin Urine: NEGATIVE
Glucose, UA: >1000 — AB
Hgb urine dipstick: NEGATIVE
Ketones, ur: NEGATIVE
Leukocytes, UA: NEGATIVE
Nitrite: NEGATIVE
Protein, ur: 30 — AB
Specific Gravity, Urine: 1.038 — ABNORMAL HIGH
Urobilinogen, UA: 0.2
pH: 5.5

## 2011-04-25 LAB — HEMOGLOBIN A1C: Hgb A1c MFr Bld: 9.7 — ABNORMAL HIGH

## 2011-04-25 LAB — DIFFERENTIAL
Basophils Absolute: 0.1
Basophils Relative: 1
Eosinophils Relative: 4
Monocytes Absolute: 1

## 2011-04-25 LAB — TSH: TSH: 1.352

## 2011-04-25 LAB — PROTIME-INR
INR: 1
Prothrombin Time: 13.1

## 2011-04-25 LAB — LIPID PANEL
Cholesterol: 225 — ABNORMAL HIGH
Total CHOL/HDL Ratio: 9.8

## 2011-04-25 LAB — HOMOCYSTEINE: Homocysteine: 9.1

## 2011-04-25 LAB — APTT: aPTT: 40 — ABNORMAL HIGH

## 2011-05-19 ENCOUNTER — Inpatient Hospital Stay (HOSPITAL_COMMUNITY)
Admission: EM | Admit: 2011-05-19 | Discharge: 2011-05-21 | DRG: 065 | Disposition: A | Discharge status: Home / Self Care | Payer: Medicare Other | Source: Ambulatory Visit | Attending: Internal Medicine | Admitting: Internal Medicine

## 2011-05-19 ENCOUNTER — Encounter (HOSPITAL_COMMUNITY): Payer: Self-pay | Admitting: Radiology

## 2011-05-19 ENCOUNTER — Emergency Department (HOSPITAL_COMMUNITY): Payer: Medicare Other

## 2011-05-19 DIAGNOSIS — I10 Essential (primary) hypertension: Secondary | ICD-10-CM | POA: Diagnosis present

## 2011-05-19 DIAGNOSIS — N390 Urinary tract infection, site not specified: Secondary | ICD-10-CM | POA: Diagnosis not present

## 2011-05-19 DIAGNOSIS — IMO0001 Reserved for inherently not codable concepts without codable children: Secondary | ICD-10-CM | POA: Diagnosis present

## 2011-05-19 DIAGNOSIS — I63239 Cerebral infarction due to unspecified occlusion or stenosis of unspecified carotid arteries: Principal | ICD-10-CM | POA: Diagnosis present

## 2011-05-19 DIAGNOSIS — F17200 Nicotine dependence, unspecified, uncomplicated: Secondary | ICD-10-CM | POA: Diagnosis present

## 2011-05-19 DIAGNOSIS — I658 Occlusion and stenosis of other precerebral arteries: Secondary | ICD-10-CM | POA: Diagnosis present

## 2011-05-19 DIAGNOSIS — Z8673 Personal history of transient ischemic attack (TIA), and cerebral infarction without residual deficits: Secondary | ICD-10-CM

## 2011-05-19 DIAGNOSIS — E876 Hypokalemia: Secondary | ICD-10-CM | POA: Diagnosis present

## 2011-05-19 DIAGNOSIS — E785 Hyperlipidemia, unspecified: Secondary | ICD-10-CM | POA: Diagnosis present

## 2011-05-19 DIAGNOSIS — G40909 Epilepsy, unspecified, not intractable, without status epilepticus: Secondary | ICD-10-CM | POA: Diagnosis present

## 2011-05-19 DIAGNOSIS — I739 Peripheral vascular disease, unspecified: Secondary | ICD-10-CM | POA: Diagnosis present

## 2011-05-19 DIAGNOSIS — Z79899 Other long term (current) drug therapy: Secondary | ICD-10-CM

## 2011-05-19 HISTORY — DX: Essential (primary) hypertension: I10

## 2011-05-19 LAB — DIFFERENTIAL
Basophils Relative: 1 % (ref 0–1)
Eosinophils Absolute: 0.6 10*3/uL (ref 0.0–0.7)
Lymphs Abs: 3.1 10*3/uL (ref 0.7–4.0)
Monocytes Relative: 7 % (ref 3–12)
Neutro Abs: 8.4 10*3/uL — ABNORMAL HIGH (ref 1.7–7.7)
Neutrophils Relative %: 65 % (ref 43–77)

## 2011-05-19 LAB — POCT I-STAT, CHEM 8
BUN: 9 mg/dL (ref 6–23)
Chloride: 104 mEq/L (ref 96–112)
Creatinine, Ser: 0.4 mg/dL — ABNORMAL LOW (ref 0.50–1.10)
Sodium: 138 mEq/L (ref 135–145)

## 2011-05-19 LAB — GLUCOSE, CAPILLARY: Glucose-Capillary: 209 mg/dL — ABNORMAL HIGH (ref 70–99)

## 2011-05-19 LAB — PROTIME-INR
INR: 1.01 (ref 0.00–1.49)
Prothrombin Time: 13.5 seconds (ref 11.6–15.2)

## 2011-05-19 LAB — CBC
Hemoglobin: 11.6 g/dL — ABNORMAL LOW (ref 12.0–15.0)
Platelets: 301 10*3/uL (ref 150–400)
RBC: 4.33 MIL/uL (ref 3.87–5.11)
WBC: 12.9 10*3/uL — ABNORMAL HIGH (ref 4.0–10.5)

## 2011-05-19 LAB — APTT: aPTT: 31 seconds (ref 24–37)

## 2011-05-20 ENCOUNTER — Inpatient Hospital Stay (HOSPITAL_COMMUNITY): Payer: Medicare Other

## 2011-05-20 LAB — URINALYSIS, ROUTINE W REFLEX MICROSCOPIC
Bilirubin Urine: NEGATIVE
Leukocytes, UA: NEGATIVE
Nitrite: NEGATIVE
Specific Gravity, Urine: 1.027 (ref 1.005–1.030)
Urobilinogen, UA: 1 mg/dL (ref 0.0–1.0)

## 2011-05-20 LAB — COMPREHENSIVE METABOLIC PANEL
Albumin: 3.4 g/dL — ABNORMAL LOW (ref 3.5–5.2)
BUN: 10 mg/dL (ref 6–23)
Calcium: 9.2 mg/dL (ref 8.4–10.5)
Creatinine, Ser: 0.4 mg/dL — ABNORMAL LOW (ref 0.50–1.10)
Total Bilirubin: 0.3 mg/dL (ref 0.3–1.2)
Total Protein: 6.9 g/dL (ref 6.0–8.3)

## 2011-05-20 LAB — PREGNANCY, URINE: Preg Test, Ur: NEGATIVE

## 2011-05-20 LAB — TROPONIN I
Troponin I: <0.3 ng/mL (ref <0.30)
Troponin I: <0.3 ng/mL (ref <0.30)

## 2011-05-20 LAB — CK TOTAL AND CKMB (NOT AT ARMC)
CK, MB: 2 ng/mL (ref 0.3–4.0)
Relative Index: INVALID (ref 0.0–2.5)
Relative Index: INVALID (ref 0.0–2.5)
Total CK: 48 U/L (ref 7–177)

## 2011-05-20 LAB — CARDIAC PANEL(CRET KIN+CKTOT+MB+TROPI): Relative Index: INVALID (ref 0.0–2.5)

## 2011-05-20 LAB — URINE MICROSCOPIC-ADD ON

## 2011-05-20 LAB — LIPID PANEL
Cholesterol: 129 mg/dL (ref 0–200)
HDL: 40 mg/dL (ref >39)
Total CHOL/HDL Ratio: 3.2 RATIO

## 2011-05-20 LAB — GLUCOSE, CAPILLARY

## 2011-05-21 LAB — GLUCOSE, CAPILLARY
Glucose-Capillary: 233 mg/dL — ABNORMAL HIGH (ref 70–99)
Glucose-Capillary: 164 mg/dL — ABNORMAL HIGH (ref 70–99)

## 2011-05-21 LAB — BASIC METABOLIC PANEL
BUN: 9 mg/dL (ref 6–23)
CO2: 21 mEq/L (ref 19–32)
Chloride: 104 mEq/L (ref 96–112)
Creatinine, Ser: 0.38 mg/dL — ABNORMAL LOW (ref 0.50–1.10)

## 2011-05-21 NOTE — Consult Note (Signed)
  Jill [name], CAVENDISH NO.:  [id number]  MEDICAL RECORD NO.:  [id number]  LOCATION:  3038                         FACILITY:  [facility]  PHYSICIAN:  Levert Feinstein, MD          DATE OF BIRTH:  Jul 15, 1966  DATE OF CONSULTATION: DATE OF DISCHARGE:                                CONSULTATION   CHIEF COMPLAINT:  New onset seizure, and subacute stroke on CAT scan.  HISTORY OF PRESENT ILLNESS:  The patient is a 45 year old right-handed African American female presenting with sudden onset seizure yesterday, generalized tonic-clonic activity, preceding by not feeling well, hungry sensation, presenting to the emergency room, as code stroke CAT scan showed subacute stroke involving the right frontal region.  History reviewed, previous right frontal stroke, angiogram in 2009 showed 85% stenosis in distal cavenous segment, proximal right IC were patent. MRA in Jan, 2011, probable occlusion of right IC supclinoid segement.  PAST MEDICAL HISTORY:  Obesity, hypertension and hyperlipidemia, type 2 diabetes, peripheral vascular disease, previous stroke, scattered bilateral internal carotid artery stenosis.  REVIEW OF SYSTEMS:  Pertinent as above.  SURGICAL HISTORY:  C-section x2, cholecystectomy, peripheral vascular disease.  ALLERGIES:  No known drug allergy.  OUTPATIENT MEDICATION:  Including Crestor, Benicar, Lyrica, potassium, Lasix, Actos, metformin and Plavix.  FAMILY HISTORY:  Mother has chronic kidney disease, been on dialysis for 26 years.  Father died at age 29 from heart failure.  SOCIAL HISTORY:  She is married.  Lives with her spouse and daughter. She is an occupational therapist.  Smokes 10 cigarettes a day.  No history of alcohol or drug abuse.  PHYSICAL EXAMINATION:  VITAL SIGNS:  Temperature 99, blood pressure 152/87, heart rate of 85, respirations of 20. CARDIAC:  Regular rate and rhythm. NEUROLOGIC:  She is awake, alert, follow commands.  Cranial nerves  II through XII are normal.  Pupils equal, round, reactive to light. Extraocular movements were full.  Facial sensation strength was normal. Uvula tongue midline.  Head turning, shoulder shrug in normal. Symmetric tongue protrusion into cheek strength was normal. Sensory was intact to light touch.  No extinction.  Deep tendon reflex present, symmetric.  Plantar responses were flexor.  There was no dysmetria.  ASSESSMENT:  A 45 year old female with multiple vascular risk factor of hypertension, diabetes, hyperlipidemia, obesity, peripheral vascular disease, presenting with new onset seizures, subacute stroke on the right frontal region.  PLAN: 1. Load with Keppra 500 t.i.d. 2. Complete evaluation MRI, MRA of the brain, echocardiogram and     ultrasound of carotid artery. 3. Plavix 75 mg+ asa 81mg  po qday     Levert Feinstein, MD     YY/MEDQ  D:  05/20/2011  T:  05/20/2011  Job:  [id number]  Electronically Signed by Levert Feinstein MD on 05/21/2011 12:20:34 PM

## 2011-05-22 ENCOUNTER — Ambulatory Visit (HOSPITAL_COMMUNITY): Payer: Medicare Other

## 2011-05-22 NOTE — Discharge Summary (Signed)
Jill [name], [name] NO.:  [id number]  MEDICAL RECORD NO.:  [id number]  LOCATION:  3038                         FACILITY:  [facility]  PHYSICIAN:  Isidor Holts, M.D.  DATE OF BIRTH:  June 19, 1966  DATE OF ADMISSION:  05/19/2011 DATE OF DISCHARGE:  05/21/2011                              DISCHARGE SUMMARY   PRIMARY PHYSICIAN:  Annia Friendly. Hill, MD  DISCHARGE DIAGNOSES: 1. 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 2. Chronic/progressive right internal carotid artery occlusion causing     acute right middle cerebral artery cerebrovascular accident. 3. Seizure disorder secondary to acute right middle cerebral artery     cerebrovascular accident. 4. Morbid obesity. 5. Type 2 diabetes mellitus. 6. Dyslipidemia. 7. Hypertension. 8. History of bilateral carotid artery stenosis. 9. Peripheral vascular disease status post right lower extremity     stent. 10.Smoking history. 11.Urinary tract infection.  DISCHARGE MEDICATIONS: 1. Aspirin enteric coated 81 mg p.o. daily OTC. 2. Ciprofloxacin 500 mg p.o. b.i.d. for 5 days from May 22, 2011. 3. Plavix 75 mg p.o. daily with meal. 4. Keppra 500 mg p.o. b.i.d. 5. Actos 45 mg half a tablet p.o. daily. 6. Benicar 40 mg p.o. daily. 7. Crestor 20 mg p.o. daily. 8. Furosemide 20 mg p.o. daily. 9. Lyrica 75 mg p.o. daily. 10.Metformin 1000 mg p.o. daily. 11.Potassium chloride oral liquid (40 mg/15 mL), 40 mg p.o. daily.  PROCEDURES: 1. Head CT scan on May 19, 2011.  This showed old strokes     involving the deep white matter of the right frontal lobe and the     inferior right basal ganglia.  These were present on prior     examinations from January 2011.  There is a new cortical stroke     involving the right frontal lobe, age indeterminate,     nonhemorrhagic.  No acute hemorrhage or hematoma. 2. Brain MRI on May 20, 2011.  This showed small acute right MCA     white matter infarct.  No  mass effect or hemorrhage.  There is     underlying chronic right MCA territory ischemia with     encephalomalacia corresponding to recent CT findings.  Chronic poor     flow or occlusion of the right ICA. 3. Brain MRA on May 20, 2011.  This showed flow in the right ICA     has further declined since 2011, not extremely poor, subsequent     little antegrade flow in the right ACA and MCA territories     superimposed hemodynamically significant chronic left ICA     supraclinoid stenosis.  This is stable.  There is negative     posterior circulation. 4. A 2-D echocardiogram done on May 21, 2011.  Report is still     pending at the time of this dictation. 5. Bilateral carotid artery duplex scan on May 20, 2011.  This     showed right ICA, cannot exclude a distal occlusion of the left ICA     stenosis.  Vertebral artery flow is antegrade bilaterally.  CONSULTATIONS:  Levert Feinstein, MD, neurologist.  ADMISSION HISTORY:  As in H and P notes of May 19, 2011, dictated  by Dr. Marcellus Scott.  However, in brief, this is a 45 year old female, with known history of type 2 diabetes mellitus, morbid obesity, hypertension, dyslipidemia, history of bilateral internal carotid artery stenosis, peripheral vascular disease status post stent, right lower extremity, history of cesarean section x2 status post cholecystectomy, status post previous CVAs, presenting following a seizure episode which occurred while she was sitting at a video game, watching her friend play. She had no antecedent history of seizure disorder.  She was brought via EMS to the emergency department and was subsequently admitted for further evaluation, investigation, and management.  CLINICAL COURSE: 1. Acute CVA.  The patient presented as described above.  Imaging     studies confirmed acute CVA in the right MCA territory.  She was     managed with antiplatelet medication, which was escalated by adding     low-dose  aspirin to her preexistent Plavix treatment.  Risk factor     modification was pursued.  Neurology consultation was kindly     provided by Dr. Levert Feinstein, who has concurred with above-mentioned     management measures.  The patient was evaluated by PT/OT, and no     acute PT/OT needs identified.  2. Progressive right MCA territory ischemia.  Brain MRA confirmed     progression of chronic poor flow/occlusion of right ICA and     incidentally, showed superimposed hemodynamically significant left     ICA supraclinoid stenosis.  These findings have been discussed with     Dr. Levert Feinstein and she has opined, that given the location of the     lesions, no intervention is feasible at this time.  This had been     communicated to the patient.  3. Seizure disorder.  This was likely secondary to the patient's acute     CVA.  She was managed on recommendation of the neurologist, with     Keppra and had no further episodes recorded during this     hospitalization.  4. Type 2 diabetes mellitus.  This was managed with carbohydrate     modified diet, oral hypoglycemics, and CBGs remained suboptimal to     reasonable, during the course of this hospitalization.  We shall     defer further titration of the patient's diabetic medication to her     primary MD on followup.  5. Dyslipidemia.  The patient's lipid profile was as follows:  Total     cholesterol 129, triglyceride 97, HDL 40, LDL 70.  This is     reasonable for a patient with known macrovascular disease.  The     patient will continue on preadmission dose of statin.  6. Hypertension.  The patient was normotensive during the course of     this hospitalization, on preadmission antihypertensive medications.  7. History of peripheral vascular disease, status post right lower     extremity stent.  The patient had no symptoms of claudication or     indeed rest pain, during this hospitalization.  8. UTI.  This was confirmed by positive urinary  sediment.  The patient     has been placed on a 7-day course of ciprofloxacin.  9. Smoking history.  The patient unfortunately, continues to smoke     rather heavily.  She was counseled appropriately during the course     of this hospitalization and managed with Nicoderm CQ patch.  She     appears inclined to consider quitting.  DISPOSITION:  The patient was on  May 21, 2011, clinically stable. There were no new focal neurologic deficits and no further seizure episodes were recorded.  She was seen by the neurologist and cleared for discharge home.  She was therefore, discharged accordingly.  ACTIVITY:  As tolerated.  Recommended to increase activity slowly.  DIET:  Heart healthy/carbohydrate modified.  SPECIAL INSTRUCTIONS: Patient may return to regular duties on May 29, 2011.  FOLLOWUP INSTRUCTIONS:  The patient is instructed to follow up with her primary MD, Dr. Mirna Mires in the coming week.  In addition, she is to follow up with Dr. Delia Heady, stroke neurologist in 1-2 weeks.  She has been supplied with appropriate information, i.e., telephone [phone] and instructed to call for an appointment and has verbalized understanding.     Isidor Holts, M.D.     CO/MEDQ  D:  05/21/2011  T:  05/21/2011  Job:  [id number]  Electronically Signed by Isidor Holts M.D. on 05/22/2011 06:33:59 PM

## 2011-05-23 NOTE — H&P (Signed)
[name], [name] NO.:  [id number]  MEDICAL RECORD NO.:  [id number]  LOCATION:  [facility]                         FACILITY:  [facility]  PHYSICIAN:  Marcellus Scott, MD     DATE OF BIRTH:  Apr 12, 1966  DATE OF ADMISSION:  05/19/2011 DATE OF DISCHARGE:                             HISTORY & PHYSICAL   PRIMARY CARE PHYSICIAN:  Annia Friendly. Hill, MD  CHIEF COMPLAINT:  Altered mental status and seizure-like activity.  HISTORY OF PRESENTING ILLNESS:  Jill [name] is a 45 year old African American female patient with history of type 2 diabetes mellitus, hypertension, hyperlipidemia, prior CVAs, carotid artery stenosis, peripheral vascular disease, who is an occupational therapist.  She was in her usual state of health until this evening.  She went to a video game store and was playing video games and says that she was not doing too well at the games.  She decided to watch her friend play.  At approximately 10 p.m., she felt as though she needed something to eat, suggesting to her that her blood sugars may be low.  The next thing she remembers is sitting on a chair near the door of the video game store and EMS coming to pick her up to bring her to the hospital.  She has no recollection of events in between.  Per discussion with the emergency room physician, the EMS told him that bystanders noted seizure-like activity during the event.  The patient denies any personal history of seizures or family history of seizures.  Currently, she indicates that she feels fine with no complaints.  She denies headache or dizziness or slurred speech or facial asymmetry or asymmetric weakness or sensory problems of her limbs.  Her blood sugars were apparently 176 mg/dL. According to the ED physician, the neurologists have consulted and do not think that she has a stroke although the CT of the head is reported as new cortical stroke involving the right frontal lobe of indeterminate age.  The  patient has been loaded with 1 g of Keppra intravenously.  [facility] have been requested to admit her for further evaluation and management.  PAST MEDICAL HISTORY: 1. Type 2 diabetes mellitus. 2. Hypertension. 3. Hyperlipidemia. 4. Scattered bilateral internal carotid artery stenosis. 5. Peripheral vascular disease status post stent in the right lower     extremity.  PAST SURGICAL HISTORY: 1. Cesarean sections x2. 2. Cholecystectomy. 3. Peripheral artery disease status post stent in the right lower     extremity.  ALLERGIES:  No known drug allergies.  MEDICATIONS: 1. Crestor 20 mg p.o. daily. 2. Benicar 40 mg p.o. daily. 3. Lyrica 75 mg p.o. daily. 4. Potassium chloride 40 mEq p.o. daily. 5. Furosemide 20 mg p.o. daily. 6. Actos 45 mg tablet, half tablet p.o. daily. 7. Metformin 1000 mg p.o. daily.  FAMILY HISTORY:  The patient's mother has chronic kidney disease and has been on dialysis for 26 years.  The patient's father died at age 89 from heart failure.  SOCIAL HISTORY:  The patient is married and lives with her spouse and daughter.  She is independent of activities of daily living.  She is an occupational therapist.  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  There is no history of alcohol or drug abuse.  ADVANCED DIRECTIVES: Full code.  REVIEW OF SYSTEMS:  All systems reviewed and apart from history of presenting illness is negative.  PHYSICAL EXAMINATION:  GENERAL:  Jill [name] is a moderately built and obese female patient who is in no obvious distress. VITAL SIGNS:  Temperature 98.6 degrees Fahrenheit, blood pressure 138/68 mmHg, pulse 87 per minute and regular, respirations 18 per minute, and saturating at 100% on room air.  HEAD, EYE, ENT:  Nontraumatic, normocephalic.  Pupils equally reacting to light and accommodation. There are bite marks on the right lateral aspect of the tongue.  Oral mucosa is moist. NECK:  Supple.  No JVD or carotid  bruit. LYMPHATICS:  No lymphadenopathy. RESPIRATORY SYSTEM:  Clear to auscultation and no increased work of breathing. CARDIOVASCULAR SYSTEM:  First and second heart sounds heard.  Regular. No JVD or murmurs. ABDOMEN:  Nondistended, soft, and normal bowel sounds heard.  No organomegaly or mass appreciated. CENTRAL NERVOUS SYSTEM:  The patient is awake, alert, oriented x3 with no focal neurological deficits. EXTREMITIES:  With grade 5/5 power. MUSCULOSKELETAL SYSTEM:  Negative.  LABORATORY DATA:  Stroke panel significant for white blood cell count of 12.9, hemoglobin 11.6, potassium 3.3, glucose 221, albumin 3.4.  CT of the head without contrast.  Impression: A.  Old strokes involving the deep white matter of the right frontal lobe and the inferior right basal ganglia.  These were present on prior exams from January 2011. B.  New cortical stroke involving the right frontal lobe, age indeterminate, nonhemorrhagic. C.  No acute hemorrhage or hematoma.  EKG shows a normal sinus rhythm at 95 beats per minute with normal axis. No ST-T changes.  ASSESSMENT AND PLAN: 1. Right brain stroke.  Question new versus old complicating previous     cerebrovascular accidents.  The patient will be admitted to     telemetry.  Neurology has apparently consulted and do not believe     that she has a new stroke.  We will obtain MRI, MRA of the head and     other stroke workup including 2-D echocardiogram and carotid     Dopplers.  The patient will be placed on full-dose aspirin.  The     patient has passed bedside swallow screen.  We will get physical     therapy and occupational therapy to evaluate. 2. Possible seizure x1.  We will place on seizure precautions and     obtain EEG.  The patient has been loaded with intravenous Keppra     and will continue with p.o. Keppra.  Will await Neurology     recommendations regarding long-term antiepileptic medications. 3. Hypokalemia.  We will replete and  follow BMET. 4. Uncontrolled type 2 diabetes mellitus.  We will check hemoglobin     A1c and place on sliding scale insulin and hold oral medications. 5. Hypertension.  Continue home medications. 6. Tobacco abuse.  Cessation counseling done.  Time taken in coordinating this history and physical note is 45 minutes.     Marcellus Scott, MD     AH/MEDQ  D:  05/20/2011  T:  05/20/2011  Job:  [id number]  cc:   Annia Friendly. Loleta Chance, MD  Electronically Signed by Marcellus Scott MD on 05/23/2011 11:30:12 AM

## 2011-06-30 ENCOUNTER — Other Ambulatory Visit: Payer: Self-pay | Admitting: Neurology

## 2011-06-30 DIAGNOSIS — I639 Cerebral infarction, unspecified: Secondary | ICD-10-CM

## 2011-07-04 ENCOUNTER — Ambulatory Visit (HOSPITAL_COMMUNITY): Payer: Self-pay

## 2011-08-14 ENCOUNTER — Emergency Department (HOSPITAL_COMMUNITY)
Admission: EM | Admit: 2011-08-14 | Discharge: 2011-08-14 | Disposition: A | Discharge status: Home / Self Care | Payer: 59 | Attending: Emergency Medicine | Admitting: Emergency Medicine

## 2011-08-14 ENCOUNTER — Emergency Department (HOSPITAL_COMMUNITY): Payer: 59

## 2011-08-14 ENCOUNTER — Encounter (HOSPITAL_COMMUNITY): Payer: Self-pay

## 2011-08-14 ENCOUNTER — Other Ambulatory Visit: Payer: Self-pay

## 2011-08-14 DIAGNOSIS — I10 Essential (primary) hypertension: Secondary | ICD-10-CM | POA: Insufficient documentation

## 2011-08-14 DIAGNOSIS — G319 Degenerative disease of nervous system, unspecified: Secondary | ICD-10-CM | POA: Insufficient documentation

## 2011-08-14 DIAGNOSIS — F29 Unspecified psychosis not due to a substance or known physiological condition: Secondary | ICD-10-CM | POA: Insufficient documentation

## 2011-08-14 DIAGNOSIS — R569 Unspecified convulsions: Secondary | ICD-10-CM | POA: Insufficient documentation

## 2011-08-14 DIAGNOSIS — E119 Type 2 diabetes mellitus without complications: Secondary | ICD-10-CM | POA: Insufficient documentation

## 2011-08-14 DIAGNOSIS — Z136 Encounter for screening for cardiovascular disorders: Secondary | ICD-10-CM | POA: Insufficient documentation

## 2011-08-14 DIAGNOSIS — Z87891 Personal history of nicotine dependence: Secondary | ICD-10-CM | POA: Insufficient documentation

## 2011-08-14 DIAGNOSIS — R5381 Other malaise: Secondary | ICD-10-CM | POA: Diagnosis not present

## 2011-08-14 DIAGNOSIS — R531 Weakness: Secondary | ICD-10-CM

## 2011-08-14 DIAGNOSIS — I6789 Other cerebrovascular disease: Secondary | ICD-10-CM | POA: Diagnosis not present

## 2011-08-14 DIAGNOSIS — R5383 Other fatigue: Secondary | ICD-10-CM | POA: Diagnosis not present

## 2011-08-14 DIAGNOSIS — R279 Unspecified lack of coordination: Secondary | ICD-10-CM | POA: Insufficient documentation

## 2011-08-14 DIAGNOSIS — Z8673 Personal history of transient ischemic attack (TIA), and cerebral infarction without residual deficits: Secondary | ICD-10-CM | POA: Diagnosis not present

## 2011-08-14 HISTORY — DX: Unspecified convulsions: R56.9

## 2011-08-14 HISTORY — DX: Cerebral infarction, unspecified: I63.9

## 2011-08-14 LAB — CBC
Hemoglobin: 11 g/dL — ABNORMAL LOW (ref 12.0–15.0)
MCH: 26.1 pg (ref 26.0–34.0)
MCHC: 32.7 g/dL (ref 30.0–36.0)
MCV: 79.8 fL (ref 78.0–100.0)

## 2011-08-14 LAB — COMPREHENSIVE METABOLIC PANEL
ALT: 16 U/L (ref 0–35)
AST: 15 U/L (ref 0–37)
Albumin: 3.5 g/dL (ref 3.5–5.2)
CO2: 24 mEq/L (ref 19–32)
Calcium: 9.6 mg/dL (ref 8.4–10.5)
Chloride: 105 mEq/L (ref 96–112)
GFR calc non Af Amer: >90 mL/min (ref >90)
Sodium: 136 mEq/L (ref 135–145)

## 2011-08-14 LAB — DIFFERENTIAL
Basophils Relative: 1 % (ref 0–1)
Eosinophils Absolute: 0.4 10*3/uL (ref 0.0–0.7)
Eosinophils Relative: 5 % (ref 0–5)
Monocytes Absolute: 0.6 10*3/uL (ref 0.1–1.0)
Monocytes Relative: 7 % (ref 3–12)
Neutrophils Relative %: 65 % (ref 43–77)

## 2011-08-14 LAB — URINALYSIS, ROUTINE W REFLEX MICROSCOPIC
Bilirubin Urine: NEGATIVE
Glucose, UA: 500 mg/dL — AB
Hgb urine dipstick: NEGATIVE
Protein, ur: 100 mg/dL — AB
Urobilinogen, UA: 1 mg/dL (ref 0.0–1.0)

## 2011-08-14 LAB — BLOOD GAS, VENOUS
Acid-base deficit: 3.2 mmol/L — ABNORMAL HIGH (ref 0.0–2.0)
Bicarbonate: 21.4 mEq/L (ref 20.0–24.0)
O2 Saturation: 94.1 %
TCO2: 20 mmol/L (ref 0–100)
pO2, Ven: 63.7 mmHg — ABNORMAL HIGH (ref 30.0–45.0)

## 2011-08-14 MED ORDER — SODIUM CHLORIDE 0.9 % IV SOLN
Freq: Once | INTRAVENOUS | Status: AC
  Administered 2011-08-14: 1000 mL via INTRAVENOUS

## 2011-08-14 NOTE — ED Provider Notes (Signed)
This chart was scribed for Gerhard Munch, MD by Williemae Natter. The patient was seen in room APA17/AP[facility]17 at 1:49 PM.  CSN: [id number]  Arrival date & time 08/14/11  1311   First MD Initiated Contact with Patient 08/14/11 1339      Chief Complaint  Patient presents with  . Stroke Symptoms     HPI Jill [name] is a 46 y.o. female with a history of stroke who presents to the Emergency Department complaining of gradual onset of symptoms that are similar to prior strokes. Pt reports having a stroke last October. Pt reported having constant left sided dragging, poor coordination, drooling, some confusion and weakness that had become more pronounced within the last week. Pt denies any facial asymmetry, vomiting, diarrhea, disorientation, chest pain or abdominal pain.  Past Medical History  Diagnosis Date  . Hypertension   . Stroke   . Diabetes mellitus   . Seizures     Past Surgical History  Procedure Date  . Cholecystectomy   . Cesarean section     No family history on file.  History  Substance Use Topics  . Smoking status: Former Games developer  . Smokeless tobacco: Not on file  . Alcohol Use: No    OB History    Grav Para Term Preterm Abortions TAB SAB Ect Mult Living                  Review of Systems  Constitutional: Positive for fatigue.  HENT: Positive for drooling.   Gastrointestinal: Negative for nausea, vomiting and diarrhea.  Neurological: Positive for weakness (left side).  Psychiatric/Behavioral: Positive for confusion.  All other systems reviewed and are negative.    Allergies  Review of patient's allergies indicates no known allergies.  Home Medications  No current outpatient prescriptions on file. Cardiac monitor   Rate: 78 bpm   Rhythm:sr -normal  Pulse oximetry on room air is 96%. Normal by my interpretation.   BP 149/78  Pulse 78  Temp(Src) 98.7 F (37.1 C) (Oral)  Resp 18  Ht 5\' 6"  (1.676 m)  Wt 315 lb (142.883 kg)  BMI 50.84 kg/m2   SpO2 96%  LMP 07/31/2011  Physical Exam  Nursing note and vitals reviewed. Constitutional: She is oriented to person, place, and time. She appears well-developed and well-nourished.  HENT:  Head: Normocephalic and atraumatic.  Eyes: Conjunctivae are normal. Pupils are equal, round, and reactive to light.  Neck: Normal range of motion. Neck supple.  Cardiovascular: Normal rate and normal heart sounds.   Pulmonary/Chest: Effort normal and breath sounds normal.  Neurological: She is alert and oriented to person, place, and time. She displays no atrophy and no tremor. No cranial nerve deficit or sensory deficit. She exhibits normal muscle tone. She displays no seizure activity. Coordination and gait normal.       No focal deficits on exam  Skin: Skin is warm and dry.  Psychiatric: She has a normal mood and affect. Her behavior is normal.    ED Course  Procedures (including critical care time)  Labs Reviewed - No data to display No results found.   No diagnosis found.  CT (reviewed by me) no acute changes  MDM  I personally performed the services described in this documentation, which was scribed in my presence. The recorded information has been reviewed and considered.   This well-appearing 46 year old female with prior strokes now presents with concerns over gradually increasing symptoms that are similar to prior episodes.  On exam the  patient is in no distress, with no discernible neurologic deficits.  The patient's vital signs are stable, and her labs are essentially reassuring.  She does have mild glucosuria, given the gradual progression of points.  The patient's diabetes and findings consistent with hyperglycemia.  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  The absence of acute findings in the patient's CAT scan are additionally reassuring.  The patient was d/c in stable condition (to continue home meds) to F/U w neuro.  Gerhard Munch, MD 08/14/11 [phone]

## 2011-08-14 NOTE — ED Notes (Signed)
Pt reports has been dragging her left leg since last Wednesday, drooling from left side of mouth, both upper extremities aching, fatigue, and mixed emotions.  Says called Dr. Ronal Fear office for appt today and was sent here.  Pt reports history of CVA in the past.

## 2011-08-14 NOTE — ED Notes (Signed)
Patient with no complaints at this time. Respirations even and unlabored. Skin warm/dry. Discharge instructions reviewed with patient at this time. Patient given opportunity to voice concerns/ask questions. IV removed per policy and band-aid applied to site. Patient discharged at this time and left Emergency Department with steady gait.  

## 2011-08-16 ENCOUNTER — Other Ambulatory Visit: Payer: Self-pay | Admitting: Neurology

## 2011-08-16 ENCOUNTER — Other Ambulatory Visit (HOSPITAL_BASED_OUTPATIENT_CLINIC_OR_DEPARTMENT_OTHER): Payer: Self-pay | Admitting: Internal Medicine

## 2011-08-16 DIAGNOSIS — R531 Weakness: Secondary | ICD-10-CM

## 2011-08-17 ENCOUNTER — Other Ambulatory Visit: Payer: Self-pay | Admitting: Neurology

## 2011-08-17 DIAGNOSIS — R531 Weakness: Secondary | ICD-10-CM

## 2011-08-24 ENCOUNTER — Other Ambulatory Visit: Payer: Self-pay | Admitting: Neurology

## 2011-08-24 DIAGNOSIS — Z139 Encounter for screening, unspecified: Secondary | ICD-10-CM

## 2011-08-25 ENCOUNTER — Ambulatory Visit: Payer: Medicare Other

## 2011-08-25 ENCOUNTER — Ambulatory Visit
Admission: RE | Admit: 2011-08-25 | Discharge: 2011-08-25 | Disposition: A | Discharge status: Home / Self Care | Payer: 59 | Source: Ambulatory Visit | Attending: Neurology | Admitting: Neurology

## 2011-08-25 DIAGNOSIS — R531 Weakness: Secondary | ICD-10-CM

## 2012-05-14 ENCOUNTER — Other Ambulatory Visit (HOSPITAL_COMMUNITY): Payer: Self-pay | Admitting: Family Medicine

## 2012-05-14 DIAGNOSIS — Z139 Encounter for screening, unspecified: Secondary | ICD-10-CM

## 2012-05-20 ENCOUNTER — Ambulatory Visit (HOSPITAL_COMMUNITY): Payer: 59

## 2012-05-27 ENCOUNTER — Ambulatory Visit (HOSPITAL_COMMUNITY)
Admission: RE | Admit: 2012-05-27 | Discharge: 2012-05-27 | Disposition: A | Discharge status: Home / Self Care | Payer: 59 | Source: Ambulatory Visit | Attending: Family Medicine | Admitting: Family Medicine

## 2012-05-27 DIAGNOSIS — Z1231 Encounter for screening mammogram for malignant neoplasm of breast: Secondary | ICD-10-CM | POA: Diagnosis not present

## 2012-05-27 DIAGNOSIS — Z139 Encounter for screening, unspecified: Secondary | ICD-10-CM

## 2012-06-24 ENCOUNTER — Emergency Department (HOSPITAL_COMMUNITY)
Admission: EM | Admit: 2012-06-24 | Discharge: 2012-06-24 | Disposition: A | Discharge status: Home / Self Care | Payer: 59 | Attending: Emergency Medicine | Admitting: Emergency Medicine

## 2012-06-24 ENCOUNTER — Encounter (HOSPITAL_COMMUNITY): Payer: Self-pay | Admitting: *Deleted

## 2012-06-24 DIAGNOSIS — Y939 Activity, unspecified: Secondary | ICD-10-CM | POA: Insufficient documentation

## 2012-06-24 DIAGNOSIS — T38801A Poisoning by unspecified hormones and synthetic substitutes, accidental (unintentional), initial encounter: Secondary | ICD-10-CM | POA: Insufficient documentation

## 2012-06-24 DIAGNOSIS — Z87891 Personal history of nicotine dependence: Secondary | ICD-10-CM | POA: Insufficient documentation

## 2012-06-24 DIAGNOSIS — E162 Hypoglycemia, unspecified: Secondary | ICD-10-CM | POA: Diagnosis not present

## 2012-06-24 DIAGNOSIS — I10 Essential (primary) hypertension: Secondary | ICD-10-CM | POA: Diagnosis not present

## 2012-06-24 DIAGNOSIS — Z794 Long term (current) use of insulin: Secondary | ICD-10-CM | POA: Diagnosis not present

## 2012-06-24 DIAGNOSIS — Z79899 Other long term (current) drug therapy: Secondary | ICD-10-CM | POA: Diagnosis not present

## 2012-06-24 DIAGNOSIS — Z8673 Personal history of transient ischemic attack (TIA), and cerebral infarction without residual deficits: Secondary | ICD-10-CM | POA: Diagnosis not present

## 2012-06-24 DIAGNOSIS — E119 Type 2 diabetes mellitus without complications: Secondary | ICD-10-CM | POA: Diagnosis not present

## 2012-06-24 DIAGNOSIS — Y929 Unspecified place or not applicable: Secondary | ICD-10-CM | POA: Insufficient documentation

## 2012-06-24 LAB — GLUCOSE, CAPILLARY: Glucose-Capillary: 78 mg/dL (ref 70–99)

## 2012-06-24 NOTE — ED Notes (Signed)
Gave patient a diet ginger ale.

## 2012-06-24 NOTE — ED Notes (Signed)
Patient sitting up in bed.  Continues to deny any complaints at this time.  Remains A&O; skin w/d. Respirations even and unlabored.

## 2012-06-24 NOTE — ED Provider Notes (Signed)
History   This chart was scribed for Donnetta Hutching, MD by Sofie Rower, ED Scribe. The patient was seen in room APA19/APA19 and the patient's care was started at 8:27PM.     CSN: [id number]  Arrival date & time 06/24/12  2001   First MD Initiated Contact with Patient 06/24/12 2027      Chief Complaint  Patient presents with  . Drug Overdose    (Consider location/radiation/quality/duration/timing/severity/associated sxs/prior treatment) The history is provided by the patient. No language interpreter was used.    Jill [name] is a 46 y.o. female , with a hx of diabetes mellitus, who presents to the Emergency Department complaining of sudden, progressively worsening, drug overdose, onset today (06/24/12). The pt reports she is feeling well at present, however, she did take the wrong insulin earlier this evening.The pt informs she took 10 units of novalog, followed by mistakenly taking a second dosage of 50 units of novalog, as opposed to her regularly scheduled 50 units of lantis. The pt has additional hx of hypertension, seizures, stroke, cesarean section, and cholecystectomy.  The pt does not smoke or drink alcohol.   PCP is Dr. Loleta Chance.    Past Medical History  Diagnosis Date  . Hypertension   . Diabetes mellitus   . Seizures   . 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 Cholecystectomy   . Cesarean section     History reviewed. No pertinent family history.  History  Substance Use Topics  . Smoking status: Former Games developer  . Smokeless tobacco: Not on file  . Alcohol Use: No    OB History    Grav Para Term Preterm Abortions TAB SAB Ect Mult Living                  Review of Systems  All other systems reviewed and are negative.    Allergies  Review of patient's allergies indicates no known allergies.  Home Medications   Current Outpatient Rx  Name  Route  Sig  Dispense  Refill  . ACETAMINOPHEN 500 MG PO  TABS   Oral   Take 1,000 mg by mouth every 6 (six) hours as needed. For pain         . ASPIRIN 81 MG PO CHEW   Oral   Chew 81 mg by mouth daily.         . FUROSEMIDE 20 MG PO TABS   Oral   Take 20 mg by mouth daily.         Marland Kitchen LAMOTRIGINE 100 MG PO TABS   Oral   Take 100 mg by mouth daily.         Marland Kitchen LEVETIRACETAM 500 MG PO TABS   Oral   Take 250 mg by mouth every 12 (twelve) hours.         Marland Kitchen METFORMIN HCL 500 MG PO TABS   Oral   Take 500 mg by mouth 2 (two) times daily.         . ADULT MULTIVITAMIN W/MINERALS CH   Oral   Take 1 tablet by mouth daily.         Marland Kitchen OLMESARTAN MEDOXOMIL 40 MG PO TABS   Oral   Take 40 mg by mouth daily.         Marland Kitchen PIOGLITAZONE HCL 15 MG PO TABS   Oral   Take 15 mg by mouth daily.         Marland Kitchen  POTASSIUM CHLORIDE CRYS ER 20 MEQ PO TBCR   Oral   Take 20 mEq by mouth daily.         Marland Kitchen PREGABALIN 75 MG PO CAPS   Oral   Take 75 mg by mouth daily.         Marland Kitchen ROSUVASTATIN CALCIUM 20 MG PO TABS   Oral   Take 20 mg by mouth at bedtime.           BP 157/74  Pulse 99  Temp 98.8 F (37.1 C) (Oral)  Resp 18  Ht 5' 6.5" (1.689 m)  Wt 317 lb (143.79 kg)  BMI 50.40 kg/m2  SpO2 99%  LMP 05/27/2012  Physical Exam  Nursing note and vitals reviewed. Constitutional: She is oriented to person, place, and time. She appears well-developed and well-nourished.  HENT:  Head: Normocephalic and atraumatic.  Eyes: Conjunctivae normal and EOM are normal. Pupils are equal, round, and reactive to light.  Neck: Normal range of motion. Neck supple.  Cardiovascular: Normal rate, regular rhythm and normal heart sounds.   Pulmonary/Chest: Effort normal and breath sounds normal.  Abdominal: Soft. Bowel sounds are normal.  Musculoskeletal: Normal range of motion.  Neurological: She is alert and oriented to person, place, and time.  Skin: Skin is warm and dry.  Psychiatric: She has a normal mood and affect.    ED Course  Procedures  (including critical care time)  DIAGNOSTIC STUDIES: Oxygen Saturation is 99% on room air, normal by my interpretation.    COORDINATION OF CARE:  8:40 PM- Treatment plan concerning continued evaluation within [facility] discussed with patient. Pt agrees with treatment.      Labs Reviewed  GLUCOSE, CAPILLARY - Abnormal; Notable for the following:    Glucose-Capillary 147 (*)     All other components within normal limits   No results found.   No diagnosis found.    MDM  Physical exam normal. Patient instructed to eat excessive calories tonight and check glucose regularly      I personally performed the services described in this documentation, which was scribed in my presence. The recorded information has been reviewed and is accurate.    Donnetta Hutching, MD 06/24/12 2211

## 2012-06-24 NOTE — ED Notes (Signed)
Discharge instructions given and reviewed with patient.  Patient instructed to eat a lot of calories tonight before she goes to bed to keep her blood sugar from dropping further.  Patient verbalized understanding to eat a lot tonight and to test her blood sugar regularly.  Patient ambulatory; discharged home in good condition.

## 2012-06-24 NOTE — ED Notes (Signed)
Patient A&O; skin w/d. Respirations even and unlabored; able to speak in complete sentences without difficulty.  Patient denies any pain at all.  Patient states she wasn't paying attention and took 60 units of Novolog instead of 10 units.  Patient states she ate some candy at home and has no complaints at this time.

## 2012-06-24 NOTE — ED Notes (Addendum)
Took 60 units of novalog , and was supposed 10 of novalog and 50 of lantus.  Alert, skin warm and dry.

## 2012-09-11 DIAGNOSIS — Z01419 Encounter for gynecological examination (general) (routine) without abnormal findings: Secondary | ICD-10-CM | POA: Diagnosis not present

## 2012-09-11 DIAGNOSIS — D259 Leiomyoma of uterus, unspecified: Secondary | ICD-10-CM | POA: Diagnosis not present

## 2012-09-11 DIAGNOSIS — I10 Essential (primary) hypertension: Secondary | ICD-10-CM | POA: Diagnosis not present

## 2012-09-11 DIAGNOSIS — E119 Type 2 diabetes mellitus without complications: Secondary | ICD-10-CM | POA: Diagnosis not present

## 2012-09-11 DIAGNOSIS — I6789 Other cerebrovascular disease: Secondary | ICD-10-CM | POA: Diagnosis not present

## 2012-09-26 ENCOUNTER — Other Ambulatory Visit: Payer: Self-pay | Admitting: Obstetrics and Gynecology

## 2012-09-26 DIAGNOSIS — N920 Excessive and frequent menstruation with regular cycle: Secondary | ICD-10-CM | POA: Diagnosis not present

## 2012-09-26 DIAGNOSIS — D259 Leiomyoma of uterus, unspecified: Secondary | ICD-10-CM

## 2012-09-30 ENCOUNTER — Other Ambulatory Visit: Payer: Self-pay | Admitting: Obstetrics and Gynecology

## 2012-09-30 DIAGNOSIS — N920 Excessive and frequent menstruation with regular cycle: Secondary | ICD-10-CM

## 2012-09-30 DIAGNOSIS — D259 Leiomyoma of uterus, unspecified: Secondary | ICD-10-CM

## 2012-10-03 ENCOUNTER — Ambulatory Visit
Admission: RE | Admit: 2012-10-03 | Discharge: 2012-10-03 | Disposition: A | Discharge status: Home / Self Care | Payer: 59 | Source: Ambulatory Visit | Attending: Obstetrics and Gynecology | Admitting: Obstetrics and Gynecology

## 2012-10-03 DIAGNOSIS — N920 Excessive and frequent menstruation with regular cycle: Secondary | ICD-10-CM

## 2012-10-03 DIAGNOSIS — D259 Leiomyoma of uterus, unspecified: Secondary | ICD-10-CM

## 2012-10-03 HISTORY — DX: Cardiac murmur, unspecified: R01.1

## 2012-10-03 HISTORY — DX: Benign neoplasm of connective and other soft tissue, unspecified: D21.9

## 2012-10-03 HISTORY — DX: Pure hypercholesterolemia, unspecified: E78.00

## 2012-10-14 ENCOUNTER — Ambulatory Visit
Admission: RE | Admit: 2012-10-14 | Discharge: 2012-10-14 | Disposition: A | Discharge status: Home / Self Care | Payer: 59 | Source: Ambulatory Visit | Attending: Obstetrics and Gynecology | Admitting: Obstetrics and Gynecology

## 2012-10-14 DIAGNOSIS — D250 Submucous leiomyoma of uterus: Secondary | ICD-10-CM | POA: Diagnosis not present

## 2012-10-14 DIAGNOSIS — D259 Leiomyoma of uterus, unspecified: Secondary | ICD-10-CM

## 2012-10-14 DIAGNOSIS — N920 Excessive and frequent menstruation with regular cycle: Secondary | ICD-10-CM

## 2012-10-14 DIAGNOSIS — D252 Subserosal leiomyoma of uterus: Secondary | ICD-10-CM | POA: Diagnosis not present

## 2012-10-14 MED ORDER — GADOBENATE DIMEGLUMINE 529 MG/ML IV SOLN
20.0000 mL | Freq: Once | INTRAVENOUS | Status: AC | PRN
  Administered 2012-10-14: 20 mL via INTRAVENOUS

## 2012-10-16 ENCOUNTER — Other Ambulatory Visit (HOSPITAL_COMMUNITY): Payer: Self-pay | Admitting: Interventional Radiology

## 2012-10-16 DIAGNOSIS — D259 Leiomyoma of uterus, unspecified: Secondary | ICD-10-CM

## 2012-10-22 ENCOUNTER — Telehealth: Payer: Self-pay | Admitting: Emergency Medicine

## 2012-10-22 NOTE — Telephone Encounter (Signed)
CALLED PT TO MAKE HER AWARE THAT DR MCCULLOUGH HAS REVIEWED HER MRI AND STILL LOOKS GOOD FOR PROCEDURE. ALSO HE WANTS TO SEE HER AS SCHEDULED IN THE OFFICE ON 10-29-12 AT 1245/100PM.  PT AGREES.

## 2012-10-25 ENCOUNTER — Other Ambulatory Visit: Payer: Self-pay | Admitting: Interventional Radiology

## 2012-10-25 DIAGNOSIS — D259 Leiomyoma of uterus, unspecified: Secondary | ICD-10-CM

## 2012-10-29 ENCOUNTER — Ambulatory Visit
Admission: RE | Admit: 2012-10-29 | Discharge: 2012-10-29 | Disposition: A | Discharge status: Home / Self Care | Payer: 59 | Source: Ambulatory Visit | Attending: Interventional Radiology | Admitting: Interventional Radiology

## 2012-10-29 DIAGNOSIS — D259 Leiomyoma of uterus, unspecified: Secondary | ICD-10-CM

## 2012-10-29 NOTE — Consult Note (Signed)
Interventional Radiology New Patient Consultation  Date: 10/03/12 Referring Physician: Silas Flood, MD  Chief Complaint: Uterine fibroids  History:  Jill [name] is a 47 year old female with an extensive and complicated past medical history including patent foramen ovale, multiple prior cerebrovascular accidents, insulin dependent diabetes and peripheral arterial disease status post stenting of her common iliac artery. Additionally, she has a long history of dysmenorrhea and menorrhagia related to her uterine fibroids. Regarding her fibroid related symptoms, she has very heavy menstruation during the first 2-3 days of her period. She wears both tampons and pads and often has to change them every 2-3 hours.   Occasionally, she has accidental spillage while traveling to and from work which is very frustrating and embarrassing for her. Her periods tend to last 7-9 days and are very regular occurring every 28 days. In addition to her menorrhagia symptoms, she also describes excessive cramping with her periods as well as some general bulk related symptoms of lower back pain, urinary frequency and urgency and feelings of bloating/heaviness. She states that she has been suffering with these symptoms since at least 2008 and her bleeding and cramping have been slowing getting progressively worse. Additionally, she complains of foul odor with her menstrual cycle. She states that ideally she would prefer to never have another period. She initially desired a complete hysterectomy, however, due to her underlying medical comorbidities and multiple prior strokes, she is a very poor surgical candidate. She comes in  to discuss the possibility of uterine artery embolization for her symptomatic uterine fibroids.   Past Medical History: Patent foramen ovale, multiple prior cerebrovascular accidents between October of 2009 and January of 2010, likely from paradoxical embolization through her PFO, seizures related to  prior CVA, diabetes, hypertension, hypercholesterolemia, peripheral arterial disease.  Prior Surgeries: C-Section x 2, cholecystectomy, endometrial ablation, right common iliac artery stenting and angioplasty in 2009.   Medications:  Actoplus, aspirin, Crestor, Benicar, Plavix, Keppra, Lamictal, Lasix, felodipine, NovoLog and Lantus.   Allergies: No known drug allergies.   Social History: G2 P2. She is married. 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 She is a smoker and smokes not quite a pack of cigarettes a day. She denies use of alcohol or recreational drugs.   Family History: Fairly extensive family history including cancer, diabetes, heart problems, hypertension, kidney disease and stroke.   Review of Symptoms: Pertinent positives listed in the history of present illness. Otherwise, the fourteen point review of systems was negative.   Prior Pap smear: 09/16/12, negative.   Imaging: In office ultrasound on 09/26/2012 documenting multiple enlarging uterine fibroids.   Physical Exam:  Vital Signs: Temperature 98.5 degrees Fahrenheit, blood pressure 145/78, heart rate 92, respiratory rate 14, oxygen saturation 99 percent on room air.  General: Obese African American female in no acute distress.  Cardiac: Regular rate and rhythm.  Pulmonary: Grossly clear to auscultation bilaterally. Breath sounds are slightly difficult to auscultate secondary to body  habitus.  Abdomen: Obese, soft and nontender. Uterus is not palpable.  Extremities: Obese, difficult to palpate the femoral arteries.  Her left radial artery is patent. A Barbeau test was performed. She has a type B wave form suggesting sufficient ulnar collateral flow through the palmar arch.  Neuro: Alert and oriented x 3, appropriate affect.   Assessment and Plan:  Charene Mccallister is a 47 year old female with an extensive and complex past medical history including symptomatic uterine  fibroids. She is not a surgical candidate  for hysterectomy and is very interested in pursuing uterine artery embolization as she finds her current menorrhagia and dysmenorrhea very bothersome and negatively impacting her activities of daily living. I discussed at length with her the relatively increased risk she has related to her underlying medical problems. Given her extensive prior stroke history, she is at some risk for recurrent stroke through her PFO.   I will need to fully evaluate her images from her interventional cardiology procedure of common iliac arterial stenting. If the stent extends over the origin of the internal iliac artery, then catheterization of that uterine artery may not be possible. If there is a decreased risk of technical success, the overall risks of the procedure would likely outweigh any benefit. I will get her imaging from the cardiovascular lab. Additionally, we will proceed with an MRI scan of the pelvis to get a better feel for the anatomic distribution and configuration of her fibroids as well as to assess for any underlying adenomyosis. Once we have fully evaluated these factors I will be better able to evaluate the overall risk of this procedure for Mrs. Kathreen Cosier.   Signed,  Sterling Big, M.D.  Vascular and Interventional Radiologist  [facility]

## 2012-10-29 NOTE — Addendum Note (Signed)
Encounter addended by: Malachy Moan, MD on: 10/29/2012 11:50 AM<BR>     Documentation filed: Clinical Notes

## 2012-12-05 ENCOUNTER — Encounter (HOSPITAL_COMMUNITY): Payer: Self-pay | Admitting: Pharmacy Technician

## 2012-12-09 ENCOUNTER — Other Ambulatory Visit: Payer: Self-pay | Admitting: Radiology

## 2012-12-12 ENCOUNTER — Encounter (HOSPITAL_COMMUNITY): Payer: Self-pay

## 2012-12-12 ENCOUNTER — Other Ambulatory Visit: Payer: Self-pay | Admitting: Interventional Radiology

## 2012-12-12 ENCOUNTER — Ambulatory Visit (HOSPITAL_COMMUNITY)
Admission: RE | Admit: 2012-12-12 | Discharge: 2012-12-12 | Disposition: A | Discharge status: Home / Self Care | Payer: 59 | Source: Ambulatory Visit | Attending: Interventional Radiology | Admitting: Interventional Radiology

## 2012-12-12 ENCOUNTER — Observation Stay (HOSPITAL_COMMUNITY)
Admission: AD | Admit: 2012-12-12 | Discharge: 2012-12-13 | Disposition: A | Discharge status: Home / Self Care | Payer: 59 | Source: Ambulatory Visit | Attending: Interventional Radiology | Admitting: Interventional Radiology

## 2012-12-12 VITALS — BP 171/75 | HR 81 | Temp 97.9°F | Resp 18

## 2012-12-12 DIAGNOSIS — Z23 Encounter for immunization: Secondary | ICD-10-CM | POA: Diagnosis not present

## 2012-12-12 DIAGNOSIS — D259 Leiomyoma of uterus, unspecified: Secondary | ICD-10-CM

## 2012-12-12 DIAGNOSIS — R112 Nausea with vomiting, unspecified: Secondary | ICD-10-CM | POA: Insufficient documentation

## 2012-12-12 DIAGNOSIS — F17200 Nicotine dependence, unspecified, uncomplicated: Secondary | ICD-10-CM | POA: Diagnosis not present

## 2012-12-12 DIAGNOSIS — E7800 Pure hypercholesterolemia, unspecified: Secondary | ICD-10-CM | POA: Insufficient documentation

## 2012-12-12 DIAGNOSIS — I10 Essential (primary) hypertension: Secondary | ICD-10-CM | POA: Insufficient documentation

## 2012-12-12 DIAGNOSIS — Z8673 Personal history of transient ischemic attack (TIA), and cerebral infarction without residual deficits: Secondary | ICD-10-CM | POA: Insufficient documentation

## 2012-12-12 DIAGNOSIS — G40909 Epilepsy, unspecified, not intractable, without status epilepticus: Secondary | ICD-10-CM | POA: Diagnosis not present

## 2012-12-12 DIAGNOSIS — E119 Type 2 diabetes mellitus without complications: Secondary | ICD-10-CM | POA: Insufficient documentation

## 2012-12-12 DIAGNOSIS — R259 Unspecified abnormal involuntary movements: Secondary | ICD-10-CM | POA: Diagnosis not present

## 2012-12-12 LAB — BASIC METABOLIC PANEL
BUN: 9 mg/dL (ref 6–23)
CO2: 22 mEq/L (ref 19–32)
Chloride: 104 mEq/L (ref 96–112)
Creatinine, Ser: 0.4 mg/dL — ABNORMAL LOW (ref 0.50–1.10)
GFR calc Af Amer: >90 mL/min (ref >90)
Potassium: 3.7 mEq/L (ref 3.5–5.1)

## 2012-12-12 LAB — CBC
HCT: 37.6 % (ref 36.0–46.0)
Hemoglobin: 11.8 g/dL — ABNORMAL LOW (ref 12.0–15.0)
MCH: 23.6 pg — ABNORMAL LOW (ref 26.0–34.0)
MCV: 75 fL — ABNORMAL LOW (ref 78.0–100.0)
Platelets: 279 10*3/uL (ref 150–400)
RBC: 5.01 MIL/uL (ref 3.87–5.11)
WBC: 9.8 10*3/uL (ref 4.0–10.5)

## 2012-12-12 LAB — APTT: aPTT: 29 seconds (ref 24–37)

## 2012-12-12 LAB — GLUCOSE, CAPILLARY: Glucose-Capillary: 221 mg/dL — ABNORMAL HIGH (ref 70–99)

## 2012-12-12 MED ORDER — DEXTROSE 5 % IV SOLN
2.0000 g | INTRAVENOUS | Status: AC
  Administered 2012-12-12: 2 g via INTRAVENOUS
  Filled 2012-12-12: qty 2

## 2012-12-12 MED ORDER — KETOROLAC TROMETHAMINE 30 MG/ML IJ SOLN
30.0000 mg | Freq: Four times a day (QID) | INTRAMUSCULAR | Status: DC
  Administered 2012-12-12 – 2012-12-13 (×3): 30 mg via INTRAVENOUS
  Filled 2012-12-12 (×7): qty 1

## 2012-12-12 MED ORDER — ONDANSETRON 8 MG/NS 50 ML IVPB
8.0000 mg | INTRAVENOUS | Status: AC
  Administered 2012-12-12: 8 mg via INTRAVENOUS
  Filled 2012-12-12: qty 8

## 2012-12-12 MED ORDER — HEPARIN SODIUM (PORCINE) 1000 UNIT/ML IJ SOLN: 3000.0000 [IU] | INTRAMUSCULAR | Status: DC

## 2012-12-12 MED ORDER — ASPIRIN 81 MG PO CHEW
81.0000 mg | CHEWABLE_TABLET | Freq: Every morning | ORAL | Status: DC
  Administered 2012-12-13: 81 mg via ORAL
  Filled 2012-12-12: qty 1

## 2012-12-12 MED ORDER — NITROGLYCERIN 1 MG/10 ML FOR IR/CATH LAB: 200.0000 ug | INTRA_ARTERIAL | Status: DC

## 2012-12-12 MED ORDER — PROMETHAZINE HCL 25 MG PO TABS: 25.0000 mg | ORAL_TABLET | Freq: Three times a day (TID) | ORAL | Status: DC | PRN

## 2012-12-12 MED ORDER — SODIUM CHLORIDE 0.9 % IV SOLN
INTRAVENOUS | Status: DC
  Administered 2012-12-12: 09:00:00 via INTRAVENOUS

## 2012-12-12 MED ORDER — CLOPIDOGREL BISULFATE 75 MG PO TABS
75.0000 mg | ORAL_TABLET | Freq: Every morning | ORAL | Status: DC
  Administered 2012-12-13: 75 mg via ORAL
  Filled 2012-12-12: qty 1

## 2012-12-12 MED ORDER — HYDROMORPHONE 0.3 MG/ML IV SOLN
INTRAVENOUS | Status: DC
  Administered 2012-12-12: 12:00:00 via INTRAVENOUS
  Administered 2012-12-12: 11 mL via INTRAVENOUS
  Administered 2012-12-12: 3.3 mg via INTRAVENOUS
  Administered 2012-12-13: 1.8 mg via INTRAVENOUS
  Filled 2012-12-12: qty 25

## 2012-12-12 MED ORDER — DIPHENHYDRAMINE HCL 50 MG/ML IJ SOLN: 12.5000 mg | Freq: Four times a day (QID) | INTRAMUSCULAR | Status: DC | PRN

## 2012-12-12 MED ORDER — ENOXAPARIN SODIUM 40 MG/0.4ML ~~LOC~~ SOLN
40.0000 mg | Freq: Once | SUBCUTANEOUS | Status: AC
  Administered 2012-12-12: 40 mg via SUBCUTANEOUS
  Filled 2012-12-12: qty 0.4

## 2012-12-12 MED ORDER — SODIUM CHLORIDE 0.9 % IJ SOLN: 9.0000 mL | INTRAMUSCULAR | Status: DC | PRN

## 2012-12-12 MED ORDER — HEPARIN SODIUM (PORCINE) 1000 UNIT/ML IJ SOLN
INTRAMUSCULAR | Status: AC
  Filled 2012-12-12: qty 1

## 2012-12-12 MED ORDER — PANTOPRAZOLE SODIUM 40 MG IV SOLR
40.0000 mg | Freq: Every day | INTRAVENOUS | Status: DC
  Administered 2012-12-12: 40 mg via INTRAVENOUS
  Filled 2012-12-12 (×2): qty 40

## 2012-12-12 MED ORDER — PNEUMOCOCCAL VAC POLYVALENT 25 MCG/0.5ML IJ INJ
0.5000 mL | INJECTION | INTRAMUSCULAR | Status: AC
  Administered 2012-12-13: 0.5 mL via INTRAMUSCULAR
  Filled 2012-12-12 (×2): qty 0.5

## 2012-12-12 MED ORDER — HEPARIN BOLUS VIA INFUSION: 3000.0000 [IU] | INTRAVENOUS | Status: DC

## 2012-12-12 MED ORDER — ONDANSETRON HCL 4 MG/2ML IJ SOLN
4.0000 mg | Freq: Four times a day (QID) | INTRAMUSCULAR | Status: DC | PRN
  Administered 2012-12-12: 4 mg via INTRAVENOUS
  Filled 2012-12-12: qty 2

## 2012-12-12 MED ORDER — SODIUM CHLORIDE 0.9 % IV SOLN
INTRAVENOUS | Status: DC
  Administered 2012-12-12 (×2): via INTRAVENOUS

## 2012-12-12 MED ORDER — ONDANSETRON HCL 4 MG/2ML IJ SOLN: 4.0000 mg | Freq: Four times a day (QID) | INTRAMUSCULAR | Status: DC | PRN

## 2012-12-12 MED ORDER — KETOROLAC TROMETHAMINE 30 MG/ML IJ SOLN
INTRAMUSCULAR | Status: AC
  Filled 2012-12-12: qty 1

## 2012-12-12 MED ORDER — VERAPAMIL HCL 2.5 MG/ML IV SOLN: 2.5000 mg | INTRAVENOUS | Status: DC

## 2012-12-12 MED ORDER — INSULIN ASPART 100 UNIT/ML ~~LOC~~ SOLN: 0.0000 [IU] | Freq: Three times a day (TID) | SUBCUTANEOUS | Status: DC

## 2012-12-12 MED ORDER — DIPHENHYDRAMINE HCL 12.5 MG/5ML PO ELIX
12.5000 mg | ORAL_SOLUTION | Freq: Four times a day (QID) | ORAL | Status: DC | PRN
  Filled 2012-12-12: qty 5

## 2012-12-12 MED ORDER — ATORVASTATIN CALCIUM 40 MG PO TABS
40.0000 mg | ORAL_TABLET | Freq: Every day | ORAL | Status: DC
  Administered 2012-12-12: 40 mg via ORAL
  Filled 2012-12-12 (×2): qty 1

## 2012-12-12 MED ORDER — FUROSEMIDE 20 MG PO TABS
20.0000 mg | ORAL_TABLET | Freq: Every morning | ORAL | Status: DC
  Administered 2012-12-13: 20 mg via ORAL
  Filled 2012-12-12: qty 1

## 2012-12-12 MED ORDER — NALOXONE HCL 0.4 MG/ML IJ SOLN: 0.4000 mg | INTRAMUSCULAR | Status: DC | PRN

## 2012-12-12 MED ORDER — HYDROCODONE-ACETAMINOPHEN 5-325 MG PO TABS
1.0000 | ORAL_TABLET | ORAL | Status: DC | PRN
  Administered 2012-12-13: 2 via ORAL
  Filled 2012-12-12 (×2): qty 2

## 2012-12-12 MED ORDER — SODIUM CHLORIDE 0.9 % IJ SOLN: 3.0000 mL | Freq: Two times a day (BID) | INTRAMUSCULAR | Status: DC

## 2012-12-12 MED ORDER — LEVETIRACETAM 250 MG PO TABS
250.0000 mg | ORAL_TABLET | Freq: Two times a day (BID) | ORAL | Status: DC
  Administered 2012-12-12 – 2012-12-13 (×2): 250 mg via ORAL
  Filled 2012-12-12 (×4): qty 1

## 2012-12-12 MED ORDER — MAGNESIUM HYDROXIDE 400 MG/5ML PO SUSP
30.0000 mL | Freq: Every day | ORAL | Status: DC
  Filled 2012-12-12: qty 30

## 2012-12-12 MED ORDER — HYDROMORPHONE HCL PF 2 MG/ML IJ SOLN
INTRAMUSCULAR | Status: AC
  Filled 2012-12-12: qty 3

## 2012-12-12 MED ORDER — VERAPAMIL HCL 2.5 MG/ML IV SOLN
INTRAVENOUS | Status: AC
  Filled 2012-12-12: qty 2

## 2012-12-12 MED ORDER — KETOROLAC TROMETHAMINE 30 MG/ML IJ SOLN
30.0000 mg | INTRAMUSCULAR | Status: AC
  Administered 2012-12-12: 30 mg via INTRAVENOUS

## 2012-12-12 MED ORDER — MIDAZOLAM HCL 2 MG/2ML IJ SOLN
INTRAMUSCULAR | Status: AC
  Filled 2012-12-12: qty 6

## 2012-12-12 MED ORDER — MIDAZOLAM HCL 2 MG/2ML IJ SOLN
INTRAMUSCULAR | Status: AC | PRN
  Administered 2012-12-12: 1 mg via INTRAVENOUS
  Administered 2012-12-12: 2 mg via INTRAVENOUS
  Administered 2012-12-12 (×2): 1 mg via INTRAVENOUS

## 2012-12-12 MED ORDER — SODIUM CHLORIDE 0.9 % IJ SOLN: 3.0000 mL | INTRAMUSCULAR | Status: DC | PRN

## 2012-12-12 MED ORDER — SODIUM CHLORIDE 0.9 % IV SOLN: 250.0000 mL | INTRAVENOUS | Status: DC | PRN

## 2012-12-12 MED ORDER — HYDROMORPHONE HCL PF 2 MG/ML IJ SOLN
0.5000 mg | INTRAMUSCULAR | Status: AC
  Administered 2012-12-12: 0.5 mg via INTRAVENOUS

## 2012-12-12 MED ORDER — HYDROMORPHONE HCL PF 1 MG/ML IJ SOLN
INTRAMUSCULAR | Status: AC | PRN
  Administered 2012-12-12: 0.5 mg via INTRAVENOUS
  Administered 2012-12-12 (×3): 1 mg via INTRAVENOUS
  Administered 2012-12-12: 0.5 mg via INTRAVENOUS

## 2012-12-12 MED ORDER — ADULT MULTIVITAMIN W/MINERALS CH
1.0000 | ORAL_TABLET | Freq: Every morning | ORAL | Status: DC
  Administered 2012-12-13: 1 via ORAL
  Filled 2012-12-12: qty 1

## 2012-12-12 MED ORDER — ONDANSETRON HCL 4 MG/2ML IJ SOLN: 8.0000 mg | INTRAMUSCULAR | Status: DC

## 2012-12-12 MED ORDER — LAMOTRIGINE 100 MG PO TABS
100.0000 mg | ORAL_TABLET | Freq: Every morning | ORAL | Status: DC
  Administered 2012-12-12 – 2012-12-13 (×2): 100 mg via ORAL
  Filled 2012-12-12 (×3): qty 1

## 2012-12-12 MED ORDER — LIDOCAINE HCL 1 % IJ SOLN
INTRAMUSCULAR | Status: AC
  Filled 2012-12-12: qty 20

## 2012-12-12 MED ORDER — POTASSIUM CHLORIDE 20 MEQ/15ML (10%) PO LIQD
20.0000 meq | Freq: Every morning | ORAL | Status: DC
  Administered 2012-12-13: 20 meq via ORAL
  Filled 2012-12-12: qty 15

## 2012-12-12 MED ORDER — DOCUSATE SODIUM 100 MG PO CAPS
100.0000 mg | ORAL_CAPSULE | Freq: Two times a day (BID) | ORAL | Status: DC
  Administered 2012-12-12 – 2012-12-13 (×2): 100 mg via ORAL
  Filled 2012-12-12 (×3): qty 1

## 2012-12-12 MED ORDER — DEXTROSE 5 % IV SOLN: 3.0000 g | INTRAVENOUS | Status: DC

## 2012-12-12 MED ORDER — LORCASERIN HCL 10 MG PO TABS: 10.0000 mg | ORAL_TABLET | Freq: Every morning | ORAL | Status: DC

## 2012-12-12 MED ORDER — IOHEXOL 300 MG/ML  SOLN
80.0000 mL | Freq: Once | INTRAMUSCULAR | Status: AC | PRN
  Administered 2012-12-12: 1 mL via INTRA_ARTERIAL

## 2012-12-12 MED ORDER — INSULIN GLARGINE 100 UNIT/ML ~~LOC~~ SOLN
60.0000 [IU] | Freq: Every day | SUBCUTANEOUS | Status: DC
  Administered 2012-12-12: 60 [IU] via SUBCUTANEOUS
  Filled 2012-12-12: qty 0.6

## 2012-12-12 MED ORDER — NICOTINE 21 MG/24HR TD PT24
21.0000 mg | MEDICATED_PATCH | Freq: Every day | TRANSDERMAL | Status: DC
  Administered 2012-12-12 – 2012-12-13 (×2): 21 mg via TRANSDERMAL
  Filled 2012-12-12 (×2): qty 1

## 2012-12-12 MED ORDER — INSULIN ASPART 100 UNIT/ML ~~LOC~~ SOLN
0.0000 [IU] | Freq: Three times a day (TID) | SUBCUTANEOUS | Status: DC
  Administered 2012-12-12 – 2012-12-13 (×2): 4 [IU] via SUBCUTANEOUS

## 2012-12-12 MED ORDER — PROMETHAZINE HCL 25 MG RE SUPP
25.0000 mg | Freq: Three times a day (TID) | RECTAL | Status: DC | PRN
  Administered 2012-12-12: 25 mg via RECTAL
  Filled 2012-12-12: qty 1

## 2012-12-12 MED ORDER — IRBESARTAN 300 MG PO TABS
300.0000 mg | ORAL_TABLET | Freq: Every day | ORAL | Status: DC
  Administered 2012-12-13: 300 mg via ORAL
  Filled 2012-12-12: qty 1

## 2012-12-12 NOTE — Progress Notes (Signed)
Interventional Radiology Post Op Check  Date: 12/12/12  HPI: 47 yo female with multiple medical comorbidities POD#0 s/p uterine artery embolization for symptomatic uterine fibroids.  She is currently awake and alert.  She reports expected abdominal pain and cramping.  She has been tolerating her clear liquid diet.  Complains of some twitching around her eyes and requests her home anti-seizure medications which are ordered.    Objective: Filed Vitals:   12/12/12 1449  BP: 166/16  Pulse: 66  Temp: 97.6 F (36.4 C)  Resp: 18   Gen: Obese female in NAD Abd: Obese, soft, NT Groin: Soft, NT, no hematoma  A/P: Doing well POD#0 s/p [place].  Will double check with nursing to make sure she gets her lamictal and keppra now.   - May now AAT - ADAT - DC foley    Signed,  Sterling Big, MD Vascular & Interventional Radiologist [facility]

## 2012-12-12 NOTE — Care Management Note (Signed)
CARE MANAGEMENT NOTE 12/12/2012  Patient:  Jill [name], Jill [name]   Account Number:  [id number]  Date Initiated:  12/12/2012  Documentation initiated by:  Davanna He  Subjective/Objective Assessment:   47 yo female admitted s/p Uterine artery embolization.     Action/Plan:   Home when stable   Anticipated DC Date:     Anticipated DC Plan:  HOME/SELF CARE      DC Planning Services  CM consult      PAC Choice  NA   Choice offered to / List presented to:  NA      DME agency  NA     HH arranged  NA      HH agency  NA   Status of service:  Completed, signed off Medicare Important Message given?   (If response is "NO", the following Medicare IM given date fields will be blank) Date Medicare IM given:   Date Additional Medicare IM given:    Discharge Disposition:    Per UR Regulation:  Reviewed for med. necessity/level of care/duration of stay  If discussed at Long Length of Stay Meetings, dates discussed:    Comments:  12/12/12 1522 Ammie Warrick,RN,BSN [phone] Cm to sign off. Pt oOBS status. Insured, no needs identified.

## 2012-12-12 NOTE — H&P (Signed)
Chief Complaint: "I'm here for my fibroids" HPI: Jill [name] is an 47 y.o. female with symptomatic uterine fibroids. She was seen by Dr. Archer Asa in office to discuss poss [place] as treatment. She had MRI and and is now scheduled to have uterine artery embo. It was discussed to attempt this procedure via radial artery approach. See PACS IR rad eval for details. PMHx and meds reviewed. Pt feels well otherwise, no recent illness or fevers.  Past Medical History:  Past Medical History  Diagnosis Date  . Hypertension   . Diabetes mellitus   . Seizures   . 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 Hypercholesterolemia   . Fibroids   . Heart murmur     Past Surgical History:  Past Surgical History  Procedure Laterality Date  . Cholecystectomy    . Cesarean section      Family History: History reviewed. No pertinent family history.  Social History:  reports that she has been smoking Cigarettes.  She has been smoking about 0.25 packs per day. She does not have any smokeless tobacco history on file. She reports that she does not drink alcohol or use illicit drugs.  Allergies: No Known Allergies  Medications: aspirin 81 MG chewable tablet (Taking) Sig - Route: Chew 81 mg by mouth every morning. - Oral Class: Historical Med Number of times this order has been changed since signing: 2 [address] furosemide (LASIX) 20 MG tablet (Taking) Sig - Route: Take 20 mg by mouth every morning. - Oral Class: Historical Med Number of times this order has been changed since signing: 2 [address] lamoTRIgine (LAMICTAL) 100 MG tablet (Taking) Sig - Route: Take 100 mg by mouth every morning. - Oral Class: Historical Med Number of times this order has been changed since signing: 2 [address] levETIRAcetam (KEPPRA) 500 MG tablet (Taking) Sig - Route: Take 250 mg by mouth every 12 (twelve) hours. - Oral Class: Historical Med Multiple Vitamin (MULITIVITAMIN WITH MINERALS) TABS  (Taking) Sig - Route: Take 1 tablet by mouth every morning. - Oral Class: Historical Med Number of times this order has been changed since signing: 2 Order [address] olmesartan (BENICAR) 40 MG tablet (Taking) Sig - Route: Take 40 mg by mouth at bedtime. - Oral Class: Historical Med Number of times this order has been changed since signing: 2 Order [address] potassium chloride 20 MEQ/15ML (10%) solution (Taking) Sig - Route: Take 20 mEq by mouth every morning. - Oral Class: Historical Med rosuvastatin (CRESTOR) 20 MG tablet (Taking) Sig - Route: Take 20 mg by mouth every morning. - Oral Class: Historical Med Number of times this order has been changed since signing: 2 Order [address] acetaminophen (TYLENOL) 500 MG tablet Sig - Route: Take 1,000 mg by mouth every 6 (six) hours as needed. For pain     Please HPI for pertinent positives, otherwise complete 10 system ROS negative.  Physical Exam: BP 135/77  Pulse 89  Temp(Src) 98.4 F (36.9 C) (Oral)  Resp 16  SpO2 100%  LMP 11/21/2012 There is no weight on file to calculate BMI.   General Appearance:  Alert, cooperative, no distress, appears stated age  Head:  Normocephalic, without obvious abnormality, atraumatic  ENT: Unremarkable  Neck: Supple, symmetrical, trachea midline  Lungs:   Clear to auscultation bilaterally, no w/r/r, respirations unlabored without use of accessory muscles.  Heart:  Regular rate and rhythm, S1, S2 normal, no murmur,  rub or gallop.  Abdomen:   Soft, non-tender, non distended.  Extremities: Extremities normal, atraumatic, no cyanosis or edema  Pulses: 2+ and symmetric radial. Barbeau test done shows prob Type B waveform with excellent O2 sats >97%  Neurologic: Normal affect, no gross deficits.   Results for orders placed during the hospital encounter of 12/12/12 (from the past 48 hour(s))  CBC     Status: Abnormal   Collection Time    12/12/12  8:00 AM      Result Value Range   WBC 9.8  4.0 - 10.5 K/uL    RBC 5.01  3.87 - 5.11 MIL/uL   Hemoglobin 11.8 (*) 12.0 - 15.0 g/dL   HCT 16.1  09.6 - 04.5 %   MCV 75.0 (*) 78.0 - 100.0 fL   MCH 23.6 (*) 26.0 - 34.0 pg   MCHC 31.4  30.0 - 36.0 g/dL   RDW 40.9 (*) 81.1 - 91.4 %   Platelets 279  150 - 400 K/uL  HCG, SERUM, QUALITATIVE     Status: None   Collection Time    12/12/12  8:00 AM      Result Value Range   Preg, Serum NEGATIVE  NEGATIVE   Comment:            THE SENSITIVITY OF THIS     METHODOLOGY IS >10 mIU/mL.   No results found.  Assessment/Plan Symptomatic uterine fibroids Discussed [place] procedure, risks, complications, use of sedation.  Discussed expected post-op course and plan for overnight admission for observation. Labs pending Consent signed in chart  Brayton El PA-C 12/12/2012, 9:06 AM

## 2012-12-12 NOTE — Procedures (Signed)
Interventional Radiology Procedure Note  Procedure: Uterine artery embolization Complications: None Recommendations: - Bedrest x 4 hrs, first 2 hours flat - Hydrate until tolerating good PO intake - ADAT after able to ambulate - DC foley after able to ambulate  Signed,  Sterling Big, MD Vascular & Interventional Radiologist [facility]

## 2012-12-12 NOTE — Progress Notes (Signed)
Patient nauseous and vomitted 2x..antiemetic given, seizure meds dose repeated due to vomiting.right groin had small amount of serous sanguinous drainage but no further drainage noted,no hematoma on right groin , right pedal pulses strong.patient complied with bedrest as ordered.

## 2012-12-12 NOTE — H&P (Signed)
Agree with PA note.    Signed,  Cliffard Hair K. Octa Uplinger, MD Vascular & Interventional Radiologist [facility]  

## 2012-12-13 ENCOUNTER — Other Ambulatory Visit: Payer: Self-pay | Admitting: Radiology

## 2012-12-13 DIAGNOSIS — D259 Leiomyoma of uterus, unspecified: Secondary | ICD-10-CM

## 2012-12-13 DIAGNOSIS — E119 Type 2 diabetes mellitus without complications: Secondary | ICD-10-CM | POA: Diagnosis not present

## 2012-12-13 DIAGNOSIS — E7800 Pure hypercholesterolemia, unspecified: Secondary | ICD-10-CM | POA: Diagnosis not present

## 2012-12-13 DIAGNOSIS — R259 Unspecified abnormal involuntary movements: Secondary | ICD-10-CM | POA: Diagnosis not present

## 2012-12-13 DIAGNOSIS — G40909 Epilepsy, unspecified, not intractable, without status epilepticus: Secondary | ICD-10-CM | POA: Diagnosis not present

## 2012-12-13 DIAGNOSIS — I10 Essential (primary) hypertension: Secondary | ICD-10-CM | POA: Diagnosis not present

## 2012-12-13 MED ORDER — PANTOPRAZOLE SODIUM 40 MG PO TBEC
40.0000 mg | DELAYED_RELEASE_TABLET | Freq: Every day | ORAL | Status: DC
  Administered 2012-12-13: 40 mg via ORAL

## 2012-12-13 MED ORDER — ALUM & MAG HYDROXIDE-SIMETH 200-200-20 MG/5ML PO SUSP
30.0000 mL | Freq: Once | ORAL | Status: AC
  Administered 2012-12-13: 30 mL via ORAL

## 2012-12-13 MED ORDER — ALUM & MAG HYDROXIDE-SIMETH 200-200-20 MG/5ML PO SUSP
30.0000 mL | ORAL | Status: DC | PRN
  Administered 2012-12-13: 30 mL via ORAL
  Filled 2012-12-13 (×2): qty 30

## 2012-12-13 NOTE — Progress Notes (Signed)
This patient is receiving IV Protonix. Based on criteria approved by the Pharmacy and Therapeutics Committee, this medication is being converted to the equivalent oral dose form. These criteria include:   . The patient is eating (either orally or per tube) and/or has been taking other orally administered medications for at least 24 hours.  . This patient has no evidence of active gastrointestinal bleeding or impaired GI absorption (gastrectomy, short bowel, patient on TNA or NPO).   If you have questions about this conversion, please contact the pharmacy department.  Berkley Harvey, [facility] 12/13/2012 8:53 AM

## 2012-12-13 NOTE — Progress Notes (Signed)
12/13/12 1015 Reviewed discharge instructions with patient. Patient verbalized understanding of discharge instructions. Pnuemo Vaccine 23 given prior to discharge. Patient called PCP there was no record of a prior Pnuemo Vaccine. Copy of discharge instructions given to patient.

## 2012-12-13 NOTE — Discharge Summary (Signed)
Agree with PA discharge note.  Pt did exceptionally well and is going home in good condition.  Signed,  Sterling Big, MD Vascular & Interventional Radiologist [facility]

## 2012-12-13 NOTE — Progress Notes (Signed)
Interventional Radiology Progress Note  Date: 12/13/12  HPI: 47 yo female doing very well POD#1 s/p uterine artery embolzation.  She has progressed nicely overnight and her symptoms are improved this morning.  She has already converted from IV to oral analgesia.  Had 1 episode of emesis last night around 7 pm.  Nausea improved subsequently and she is tolerating PO.  Feeling of facial ticks from last evening have now subsided.  She states she is ready to go home.  Objective: Filed Vitals:   12/13/12 0000  BP:   Pulse:   Temp:   Resp: 18   Gen: Obese female in NAD.  Neuro: A&O x 3, no focal deficits Abd: Obese, soft, NT Right groin: Soft, NT, no hematoma.  Dressing C/D/I  A/P:  Ready for DC home.  - F/U in IR clinic in 4-6 weeks - Home with colace, milk of magnesia, prilosec OTC, pain control and zofran for nausea  Signed,  Sterling Big, MD Vascular & Interventional Radiologist [facility]

## 2012-12-13 NOTE — Progress Notes (Signed)
Pt stated she did not like the i.v. diluadid for pain and would rather do the p.o. Pain med.

## 2012-12-13 NOTE — Discharge Summary (Signed)
Physician Discharge Summary  Patient ID: Jill [name] MRN: [id number] DOB/AGE: [birth year] 47 y.o.  Admit date: 12/12/2012 Discharge date: 12/13/2012  Admission Diagnoses: Symptomatic uterine fibroids  Discharge Diagnoses: Symptomatic uterine fibroids, status post successful bilateral uterine artery embolization on 12/12/2012 Past Medical History  Diagnosis Date  . Hypertension   . Diabetes mellitus   . Seizures   . 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 Hypercholesterolemia   . Fibroids   . Heart murmur      Discharged Condition: good  Hospital Course: Jill [name] is a 47 year old black female, patient of Dr. Silas Flood, who was referred to the interventional radiology service for further evaluation and treatment of symptomatic uterine fibroids . On 12/12/2012 the patient underwent successful bilateral uterine artery embolization by Dr. Malachy Moan via IV conscious sedation . The patient tolerated the procedure well and was admitted for overnight observation post procedure. During her hospital stay the patient did complain of intermittent pelvic cramping, nausea and mild vomiting. She was stable neurologically with the exception of some mild facial ticks which resolved on the day of discharge. The patient was placed on a dilaudid PCA pump and subsequently transitioned to oral narcotic medication. She was given anti-emetics for nausea and vomiting and Mylanta for reflux symptoms. On the morning of discharge the patient was stable with exception of some mild intermittent pelvic cramps and intermittent heartburn. She was able to void , ambulate and tolerate her diet without difficulty. She denied hematuria or dysuria. She was seen and evaluated by Dr. Archer Asa and deemed stable for discharge. The patient will followup with Dr. Archer Asa in the interventional radiology clinic in approximately 4-6 weeks. She was told to contact our service in the interim  with any  further questions. She will continue current gynecologic care with Dr. Donzetta Matters and resume her usual home medications. She was given prescriptions for Phenergan 25 mg 1 tablet every 6 hours as needed for nausea (#12), ibuprofen 600 mg one tablet every 6 hours for the next 48 hours, and oxycodone 5 mg 1-2 tablets every 4 hours as needed for pain( #30).  Consults: none  Significant Diagnostic Studies:  Results for orders placed during the hospital encounter of 12/12/12  CBC      Result Value Range   WBC 9.8  4.0 - 10.5 K/uL   RBC 5.01  3.87 - 5.11 MIL/uL   Hemoglobin 11.8 (*) 12.0 - 15.0 g/dL   HCT 56.2  13.0 - 86.5 %   MCV 75.0 (*) 78.0 - 100.0 fL   MCH 23.6 (*) 26.0 - 34.0 pg   MCHC 31.4  30.0 - 36.0 g/dL   RDW 78.4 (*) 69.6 - 29.5 %   Platelets 279  150 - 400 K/uL  HCG, SERUM, QUALITATIVE      Result Value Range   Preg, Serum NEGATIVE  NEGATIVE  BASIC METABOLIC PANEL      Result Value Range   Sodium 136  135 - 145 mEq/L   Potassium 3.7  3.5 - 5.1 mEq/L   Chloride 104  96 - 112 mEq/L   CO2 22  19 - 32 mEq/L   Glucose, Bld 122 (*) 70 - 99 mg/dL   BUN 9  6 - 23 mg/dL   Creatinine, Ser 2.84 (*) 0.50 - 1.10 mg/dL   Calcium 9.7  8.4 - 13.2 mg/dL   GFR calc non Af Amer >90  >90 mL/min   GFR calc Af  Amer >90  >90 mL/min  PROTIME-INR      Result Value Range   Prothrombin Time 12.1  11.6 - 15.2 seconds   INR 0.90  0.00 - 1.49  APTT      Result Value Range   aPTT 29  24 - 37 seconds  GLUCOSE, CAPILLARY      Result Value Range   Glucose-Capillary 187 (*) 70 - 99 mg/dL   Comment 1 Notify RN    GLUCOSE, CAPILLARY      Result Value Range   Glucose-Capillary 221 (*) 70 - 99 mg/dL   Comment 1 Notify RN       Treatments: Ir Angiogram Pelvis Selective Or Supraselective  12/12/2012   *RADIOLOGY REPORT*  UTERINE ARTERY EMBOLIZATION  Date: 12/12/2012  Clinical History: 47 year old female with symptomatic uterine fibroids and multiple additional medical problems.  She presents today for  uterine artery embolization.  Procedures Performed: 1. Ultrasound interrogation of the left radial artery 2.  Ultrasound-guided puncture of the right common femoral artery 3.  Right external and common iliac arteriogram 4.  Catheterization of the left common iliac artery with arteriogram 4.  Catheterization of the left internal iliac artery with arteriogram 5.  Superselective catheterization of the horizontal segment of the left uterine artery with arteriogram 6.  Particle embolization of the left uterine artery to near stasis 7.  Catheterization of the right common iliac artery with arteriogram 8.  Catheterization of the right internal artery iliac artery with arteriogram 9.  Catheterization of the horizontal segment of the right uterine artery with arteriogram 10.  Particle embolization of the right uterine artery to near stasis 11.  Arterial closure with Mynx device  Interventional Radiologist:  Sterling Big, MD  Sedation: Moderate (conscious) sedation was used.  Five mg Versed, 3 mg Dilaudid were administered intravenously.  The patient's vital signs were monitored continuously by radiology nursing throughout the procedure.  Sedation Time: 132 minutes  Fluoroscopy time: 29 minutes 36 seconds  Contrast volume: 80 ml Omnipaque-300  Antibiotics:  2 grams Rocephin was administered intravenously within 1 hour of skin incision.  PROCEDURE/FINDINGS:   Informed consent was obtained from the patient following explanation of the procedure, risks, benefits and alternatives. The patient understands, agrees and consents for the procedure. All questions were addressed. A time out was performed.  The left radial artery was interrogated with ultrasound.  The artery varies in size from 0.22 mm to 0.28 mm.  Overall, the artery is of borderline caliber for catheterization.  The right groin was interrogated with ultrasound.  The common femoral artery is widely patent and free from significant atherosclerotic disease.  The  decision was made to proceed with a right femoral catheterization.  Maximal barrier sterile technique utilized including caps, mask, sterile gowns, sterile gloves, large sterile drape, hand hygiene, and betadine skin prep.  The right groin was again interrogated with ultrasound and the common femoral artery found to be patent and free from disease. Local anesthesia was achieved by infiltration of 1% lidocaine. Under direct sonographic guidance, the right common femoral artery was punctured with a 21-gauge micropuncture needle.  A micro wire was advanced into the common iliac artery and a 5-French transitional micro sheath advanced over the wire.  A gentle hand injection of contrast material through the micro sheath confirmed placement within the mid common femoral artery.  A Benson wire was advanced into the abdominal aorta and the transitional sheath exchanged for a 5-French working sheath.  The Cobra catheter was  then advanced opened over the bifurcation into the left common iliac artery.  An oblique arteriogram was performed to demonstrate the origin of the internal iliac artery. The internal iliac artery was then catheterized and an arteriogram performed demonstrating the origin of the uterine artery.  The uterine artery was then selectively catheterized with a micro catheter and the catheter superselective we advanced the horizontal segment of the uterine artery.  A final contrast injection confirmed placement in the uterine artery with multiple hypertrophied fibroid branches.  Particle embolization was then performed with the 1.2 vials of 500 - 700 micron embospheres and one vial of 700 - 900 micron embospheres. Embolization was carried to near stasis.  Post embolization arteriogram demonstrated successful pruning of the vasculature with reflux of contrast material.  The micro catheter was then removed. The Cobra catheter was then exchanged over a wire for a RIM catheter.  The RIM catheter was used to select  the right internal iliac artery.  An arteriogram was performed demonstrating origin the uterine artery.  The micro catheter was reintroduced and used to select the right uterine artery.  The initial arteriogram demonstrates a prominent vesicovaginal branch.  The micro catheter was carefully advanced beyond the origin of this branch.  Embolization was then performed using a 0.8 vials of 500 - 700 micron embospheres and one vial of 700 - 900 micron embospheres.  Embolization was carried to near stasis.  Post embolization arteriogram demonstrated successful pruning of the vasculature with reflux of contrast material. The catheters were then removed.  The arterial access site was secured using the Mynx Ace closure device. The patient tolerated the procedure well.  There is no immediate complication.  IMPRESSION:  Technically successful uterine artery embolization.  Signed,  Sterling Big, MD Vascular & Interventional Radiologist [facility]   Original Report Authenticated By: Malachy Moan, M.D.   Ir Angiogram Pelvis Selective Or Supraselective  12/12/2012   *RADIOLOGY REPORT*  UTERINE ARTERY EMBOLIZATION  Date: 12/12/2012  Clinical History: 47 year old female with symptomatic uterine fibroids and multiple additional medical problems.  She presents today for uterine artery embolization.  Procedures Performed: 1. Ultrasound interrogation of the left radial artery 2.  Ultrasound-guided puncture of the right common femoral artery 3.  Right external and common iliac arteriogram 4.  Catheterization of the left common iliac artery with arteriogram 4.  Catheterization of the left internal iliac artery with arteriogram 5.  Superselective catheterization of the horizontal segment of the left uterine artery with arteriogram 6.  Particle embolization of the left uterine artery to near stasis 7.  Catheterization of the right common iliac artery with arteriogram 8.  Catheterization of the right internal artery  iliac artery with arteriogram 9.  Catheterization of the horizontal segment of the right uterine artery with arteriogram 10.  Particle embolization of the right uterine artery to near stasis 11.  Arterial closure with Mynx device  Interventional Radiologist:  Sterling Big, MD  Sedation: Moderate (conscious) sedation was used.  Five mg Versed, 3 mg Dilaudid were administered intravenously.  The patient's vital signs were monitored continuously by radiology nursing throughout the procedure.  Sedation Time: 132 minutes  Fluoroscopy time: 29 minutes 36 seconds  Contrast volume: 80 ml Omnipaque-300  Antibiotics:  2 grams Rocephin was administered intravenously within 1 hour of skin incision.  PROCEDURE/FINDINGS:   Informed consent was obtained from the patient following explanation of the procedure, risks, benefits and alternatives. The patient understands, agrees and consents for the procedure. All questions were  addressed. A time out was performed.  The left radial artery was interrogated with ultrasound.  The artery varies in size from 0.22 mm to 0.28 mm.  Overall, the artery is of borderline caliber for catheterization.  The right groin was interrogated with ultrasound.  The common femoral artery is widely patent and free from significant atherosclerotic disease.  The decision was made to proceed with a right femoral catheterization.  Maximal barrier sterile technique utilized including caps, mask, sterile gowns, sterile gloves, large sterile drape, hand hygiene, and betadine skin prep.  The right groin was again interrogated with ultrasound and the common femoral artery found to be patent and free from disease. Local anesthesia was achieved by infiltration of 1% lidocaine. Under direct sonographic guidance, the right common femoral artery was punctured with a 21-gauge micropuncture needle.  A micro wire was advanced into the common iliac artery and a 5-French transitional micro sheath advanced over the wire.   A gentle hand injection of contrast material through the micro sheath confirmed placement within the mid common femoral artery.  A Benson wire was advanced into the abdominal aorta and the transitional sheath exchanged for a 5-French working sheath.  The Cobra catheter was then advanced opened over the bifurcation into the left common iliac artery.  An oblique arteriogram was performed to demonstrate the origin of the internal iliac artery. The internal iliac artery was then catheterized and an arteriogram performed demonstrating the origin of the uterine artery.  The uterine artery was then selectively catheterized with a micro catheter and the catheter superselective we advanced the horizontal segment of the uterine artery.  A final contrast injection confirmed placement in the uterine artery with multiple hypertrophied fibroid branches.  Particle embolization was then performed with the 1.2 vials of 500 - 700 micron embospheres and one vial of 700 - 900 micron embospheres. Embolization was carried to near stasis.  Post embolization arteriogram demonstrated successful pruning of the vasculature with reflux of contrast material.  The micro catheter was then removed. The Cobra catheter was then exchanged over a wire for a RIM catheter.  The RIM catheter was used to select the right internal iliac artery.  An arteriogram was performed demonstrating origin the uterine artery.  The micro catheter was reintroduced and used to select the right uterine artery.  The initial arteriogram demonstrates a prominent vesicovaginal branch.  The micro catheter was carefully advanced beyond the origin of this branch.  Embolization was then performed using a 0.8 vials of 500 - 700 micron embospheres and one vial of 700 - 900 micron embospheres.  Embolization was carried to near stasis.  Post embolization arteriogram demonstrated successful pruning of the vasculature with reflux of contrast material. The catheters were then removed.   The arterial access site was secured using the Mynx Ace closure device. The patient tolerated the procedure well.  There is no immediate complication.  IMPRESSION:  Technically successful uterine artery embolization.  Signed,  Sterling Big, MD Vascular & Interventional Radiologist [facility]   Original Report Authenticated By: Malachy Moan, M.D.   Ir Angiogram Selective Each Additional Vessel  12/12/2012   *RADIOLOGY REPORT*  UTERINE ARTERY EMBOLIZATION  Date: 12/12/2012  Clinical History: 47 year old female with symptomatic uterine fibroids and multiple additional medical problems.  She presents today for uterine artery embolization.  Procedures Performed: 1. Ultrasound interrogation of the left radial artery 2.  Ultrasound-guided puncture of the right common femoral artery 3.  Right external and common iliac arteriogram 4.  Catheterization  of the left common iliac artery with arteriogram 4.  Catheterization of the left internal iliac artery with arteriogram 5.  Superselective catheterization of the horizontal segment of the left uterine artery with arteriogram 6.  Particle embolization of the left uterine artery to near stasis 7.  Catheterization of the right common iliac artery with arteriogram 8.  Catheterization of the right internal artery iliac artery with arteriogram 9.  Catheterization of the horizontal segment of the right uterine artery with arteriogram 10.  Particle embolization of the right uterine artery to near stasis 11.  Arterial closure with Mynx device  Interventional Radiologist:  Sterling Big, MD  Sedation: Moderate (conscious) sedation was used.  Five mg Versed, 3 mg Dilaudid were administered intravenously.  The patient's vital signs were monitored continuously by radiology nursing throughout the procedure.  Sedation Time: 132 minutes  Fluoroscopy time: 29 minutes 36 seconds  Contrast volume: 80 ml Omnipaque-300  Antibiotics:  2 grams Rocephin was administered  intravenously within 1 hour of skin incision.  PROCEDURE/FINDINGS:   Informed consent was obtained from the patient following explanation of the procedure, risks, benefits and alternatives. The patient understands, agrees and consents for the procedure. All questions were addressed. A time out was performed.  The left radial artery was interrogated with ultrasound.  The artery varies in size from 0.22 mm to 0.28 mm.  Overall, the artery is of borderline caliber for catheterization.  The right groin was interrogated with ultrasound.  The common femoral artery is widely patent and free from significant atherosclerotic disease.  The decision was made to proceed with a right femoral catheterization.  Maximal barrier sterile technique utilized including caps, mask, sterile gowns, sterile gloves, large sterile drape, hand hygiene, and betadine skin prep.  The right groin was again interrogated with ultrasound and the common femoral artery found to be patent and free from disease. Local anesthesia was achieved by infiltration of 1% lidocaine. Under direct sonographic guidance, the right common femoral artery was punctured with a 21-gauge micropuncture needle.  A micro wire was advanced into the common iliac artery and a 5-French transitional micro sheath advanced over the wire.  A gentle hand injection of contrast material through the micro sheath confirmed placement within the mid common femoral artery.  A Benson wire was advanced into the abdominal aorta and the transitional sheath exchanged for a 5-French working sheath.  The Cobra catheter was then advanced opened over the bifurcation into the left common iliac artery.  An oblique arteriogram was performed to demonstrate the origin of the internal iliac artery. The internal iliac artery was then catheterized and an arteriogram performed demonstrating the origin of the uterine artery.  The uterine artery was then selectively catheterized with a micro catheter and the  catheter superselective we advanced the horizontal segment of the uterine artery.  A final contrast injection confirmed placement in the uterine artery with multiple hypertrophied fibroid branches.  Particle embolization was then performed with the 1.2 vials of 500 - 700 micron embospheres and one vial of 700 - 900 micron embospheres. Embolization was carried to near stasis.  Post embolization arteriogram demonstrated successful pruning of the vasculature with reflux of contrast material.  The micro catheter was then removed. The Cobra catheter was then exchanged over a wire for a RIM catheter.  The RIM catheter was used to select the right internal iliac artery.  An arteriogram was performed demonstrating origin the uterine artery.  The micro catheter was reintroduced and used to select the  right uterine artery.  The initial arteriogram demonstrates a prominent vesicovaginal branch.  The micro catheter was carefully advanced beyond the origin of this branch.  Embolization was then performed using a 0.8 vials of 500 - 700 micron embospheres and one vial of 700 - 900 micron embospheres.  Embolization was carried to near stasis.  Post embolization arteriogram demonstrated successful pruning of the vasculature with reflux of contrast material. The catheters were then removed.  The arterial access site was secured using the Mynx Ace closure device. The patient tolerated the procedure well.  There is no immediate complication.  IMPRESSION:  Technically successful uterine artery embolization.  Signed,  Sterling Big, MD Vascular & Interventional Radiologist [facility]   Original Report Authenticated By: Malachy Moan, M.D.   Ir Angiogram Selective Each Additional Vessel  12/12/2012   *RADIOLOGY REPORT*  UTERINE ARTERY EMBOLIZATION  Date: 12/12/2012  Clinical History: 47 year old female with symptomatic uterine fibroids and multiple additional medical problems.  She presents today for uterine artery  embolization.  Procedures Performed: 1. Ultrasound interrogation of the left radial artery 2.  Ultrasound-guided puncture of the right common femoral artery 3.  Right external and common iliac arteriogram 4.  Catheterization of the left common iliac artery with arteriogram 4.  Catheterization of the left internal iliac artery with arteriogram 5.  Superselective catheterization of the horizontal segment of the left uterine artery with arteriogram 6.  Particle embolization of the left uterine artery to near stasis 7.  Catheterization of the right common iliac artery with arteriogram 8.  Catheterization of the right internal artery iliac artery with arteriogram 9.  Catheterization of the horizontal segment of the right uterine artery with arteriogram 10.  Particle embolization of the right uterine artery to near stasis 11.  Arterial closure with Mynx device  Interventional Radiologist:  Sterling Big, MD  Sedation: Moderate (conscious) sedation was used.  Five mg Versed, 3 mg Dilaudid were administered intravenously.  The patient's vital signs were monitored continuously by radiology nursing throughout the procedure.  Sedation Time: 132 minutes  Fluoroscopy time: 29 minutes 36 seconds  Contrast volume: 80 ml Omnipaque-300  Antibiotics:  2 grams Rocephin was administered intravenously within 1 hour of skin incision.  PROCEDURE/FINDINGS:   Informed consent was obtained from the patient following explanation of the procedure, risks, benefits and alternatives. The patient understands, agrees and consents for the procedure. All questions were addressed. A time out was performed.  The left radial artery was interrogated with ultrasound.  The artery varies in size from 0.22 mm to 0.28 mm.  Overall, the artery is of borderline caliber for catheterization.  The right groin was interrogated with ultrasound.  The common femoral artery is widely patent and free from significant atherosclerotic disease.  The decision was made  to proceed with a right femoral catheterization.  Maximal barrier sterile technique utilized including caps, mask, sterile gowns, sterile gloves, large sterile drape, hand hygiene, and betadine skin prep.  The right groin was again interrogated with ultrasound and the common femoral artery found to be patent and free from disease. Local anesthesia was achieved by infiltration of 1% lidocaine. Under direct sonographic guidance, the right common femoral artery was punctured with a 21-gauge micropuncture needle.  A micro wire was advanced into the common iliac artery and a 5-French transitional micro sheath advanced over the wire.  A gentle hand injection of contrast material through the micro sheath confirmed placement within the mid common femoral artery.  A Benson wire was  advanced into the abdominal aorta and the transitional sheath exchanged for a 5-French working sheath.  The Cobra catheter was then advanced opened over the bifurcation into the left common iliac artery.  An oblique arteriogram was performed to demonstrate the origin of the internal iliac artery. The internal iliac artery was then catheterized and an arteriogram performed demonstrating the origin of the uterine artery.  The uterine artery was then selectively catheterized with a micro catheter and the catheter superselective we advanced the horizontal segment of the uterine artery.  A final contrast injection confirmed placement in the uterine artery with multiple hypertrophied fibroid branches.  Particle embolization was then performed with the 1.2 vials of 500 - 700 micron embospheres and one vial of 700 - 900 micron embospheres. Embolization was carried to near stasis.  Post embolization arteriogram demonstrated successful pruning of the vasculature with reflux of contrast material.  The micro catheter was then removed. The Cobra catheter was then exchanged over a wire for a RIM catheter.  The RIM catheter was used to select the right internal  iliac artery.  An arteriogram was performed demonstrating origin the uterine artery.  The micro catheter was reintroduced and used to select the right uterine artery.  The initial arteriogram demonstrates a prominent vesicovaginal branch.  The micro catheter was carefully advanced beyond the origin of this branch.  Embolization was then performed using a 0.8 vials of 500 - 700 micron embospheres and one vial of 700 - 900 micron embospheres.  Embolization was carried to near stasis.  Post embolization arteriogram demonstrated successful pruning of the vasculature with reflux of contrast material. The catheters were then removed.  The arterial access site was secured using the Mynx Ace closure device. The patient tolerated the procedure well.  There is no immediate complication.  IMPRESSION:  Technically successful uterine artery embolization.  Signed,  Sterling Big, MD Vascular & Interventional Radiologist [facility]iology   Original Report Authenticated By: Malachy Moan, M.D.   Ir US Guide Vasc Access Right  12/12/2012   *RADIOLOGY REPORT*  UTERINE ARTERY EMBOLIZATION  Date: 12/12/2012  Clinical History: 47 year old female with symptomatic uterine fibroids and multiple additional medical problems.  She presents today for uterine artery embolization.  Procedures Performed: 1. Ultrasound interrogation of the left radial artery 2.  Ultrasound-guided puncture of the right common femoral artery 3.  Right external and common iliac arteriogram 4.  Catheterization of the left common iliac artery with arteriogram 4.  Catheterization of the left internal iliac artery with arteriogram 5.  Superselective catheterization of the horizontal segment of the left uterine artery with arteriogram 6.  Particle embolization of the left uterine artery to near stasis 7.  Catheterization of the right common iliac artery with arteriogram 8.  Catheterization of the right internal artery iliac artery with arteriogram 9.   Catheterization of the horizontal segment of the right uterine artery with arteriogram 10.  Particle embolization of the right uterine artery to near stasis 11.  Arterial closure with Mynx device  Interventional Radiologist:  Sterling Big, MD  Sedation: Moderate (conscious) sedation was used.  Five mg Versed, 3 mg Dilaudid were administered intravenously.  The patient's vital signs were monitored continuously by radiology nursing throughout the procedure.  Sedation Time: 132 minutes  Fluoroscopy time: 29 minutes 36 seconds  Contrast volume: 80 ml Omnipaque-300  Antibiotics:  2 grams Rocephin was administered intravenously within 1 hour of skin incision.  PROCEDURE/FINDINGS:   Informed consent was obtained from the patient following  explanation of the procedure, risks, benefits and alternatives. The patient understands, agrees and consents for the procedure. All questions were addressed. A time out was performed.  The left radial artery was interrogated with ultrasound.  The artery varies in size from 0.22 mm to 0.28 mm.  Overall, the artery is of borderline caliber for catheterization.  The right groin was interrogated with ultrasound.  The common femoral artery is widely patent and free from significant atherosclerotic disease.  The decision was made to proceed with a right femoral catheterization.  Maximal barrier sterile technique utilized including caps, mask, sterile gowns, sterile gloves, large sterile drape, hand hygiene, and betadine skin prep.  The right groin was again interrogated with ultrasound and the common femoral artery found to be patent and free from disease. Local anesthesia was achieved by infiltration of 1% lidocaine. Under direct sonographic guidance, the right common femoral artery was punctured with a 21-gauge micropuncture needle.  A micro wire was advanced into the common iliac artery and a 5-French transitional micro sheath advanced over the wire.  A gentle hand injection of  contrast material through the micro sheath confirmed placement within the mid common femoral artery.  A Benson wire was advanced into the abdominal aorta and the transitional sheath exchanged for a 5-French working sheath.  The Cobra catheter was then advanced opened over the bifurcation into the left common iliac artery.  An oblique arteriogram was performed to demonstrate the origin of the internal iliac artery. The internal iliac artery was then catheterized and an arteriogram performed demonstrating the origin of the uterine artery.  The uterine artery was then selectively catheterized with a micro catheter and the catheter superselective we advanced the horizontal segment of the uterine artery.  A final contrast injection confirmed placement in the uterine artery with multiple hypertrophied fibroid branches.  Particle embolization was then performed with the 1.2 vials of 500 - 700 micron embospheres and one vial of 700 - 900 micron embospheres. Embolization was carried to near stasis.  Post embolization arteriogram demonstrated successful pruning of the vasculature with reflux of contrast material.  The micro catheter was then removed. The Cobra catheter was then exchanged over a wire for a RIM catheter.  The RIM catheter was used to select the right internal iliac artery.  An arteriogram was performed demonstrating origin the uterine artery.  The micro catheter was reintroduced and used to select the right uterine artery.  The initial arteriogram demonstrates a prominent vesicovaginal branch.  The micro catheter was carefully advanced beyond the origin of this branch.  Embolization was then performed using a 0.8 vials of 500 - 700 micron embospheres and one vial of 700 - 900 micron embospheres.  Embolization was carried to near stasis.  Post embolization arteriogram demonstrated successful pruning of the vasculature with reflux of contrast material. The catheters were then removed.  The arterial access site  was secured using the Mynx Ace closure device. The patient tolerated the procedure well.  There is no immediate complication.  IMPRESSION:  Technically successful uterine artery embolization.  Signed,  Sterling Big, MD Vascular & Interventional Radiologist [facility] Radiology   Original Report Authenticated By: Malachy Moan, M.D.   Ir Embo Tumor Organ Ischemia Infarct Inc Guide Roadmapping  12/12/2012   *RADIOLOGY REPORT*  UTERINE ARTERY EMBOLIZATION  Date: 12/12/2012  Clinical History: 47 year old female with symptomatic uterine fibroids and multiple additional medical problems.  She presents today for uterine artery embolization.  Procedures Performed: 1. Ultrasound interrogation of the left  radial artery 2.  Ultrasound-guided puncture of the right common femoral artery 3.  Right external and common iliac arteriogram 4.  Catheterization of the left common iliac artery with arteriogram 4.  Catheterization of the left internal iliac artery with arteriogram 5.  Superselective catheterization of the horizontal segment of the left uterine artery with arteriogram 6.  Particle embolization of the left uterine artery to near stasis 7.  Catheterization of the right common iliac artery with arteriogram 8.  Catheterization of the right internal artery iliac artery with arteriogram 9.  Catheterization of the horizontal segment of the right uterine artery with arteriogram 10.  Particle embolization of the right uterine artery to near stasis 11.  Arterial closure with Mynx device  Interventional Radiologist:  Sterling Big, MD  Sedation: Moderate (conscious) sedation was used.  Five mg Versed, 3 mg Dilaudid were administered intravenously.  The patient's vital signs were monitored continuously by radiology nursing throughout the procedure.  Sedation Time: 132 minutes  Fluoroscopy time: 29 minutes 36 seconds  Contrast volume: 80 ml Omnipaque-300  Antibiotics:  2 grams Rocephin was administered intravenously  within 1 hour of skin incision.  PROCEDURE/FINDINGS:   Informed consent was obtained from the patient following explanation of the procedure, risks, benefits and alternatives. The patient understands, agrees and consents for the procedure. All questions were addressed. A time out was performed.  The left radial artery was interrogated with ultrasound.  The artery varies in size from 0.22 mm to 0.28 mm.  Overall, the artery is of borderline caliber for catheterization.  The right groin was interrogated with ultrasound.  The common femoral artery is widely patent and free from significant atherosclerotic disease.  The decision was made to proceed with a right femoral catheterization.  Maximal barrier sterile technique utilized including caps, mask, sterile gowns, sterile gloves, large sterile drape, hand hygiene, and betadine skin prep.  The right groin was again interrogated with ultrasound and the common femoral artery found to be patent and free from disease. Local anesthesia was achieved by infiltration of 1% lidocaine. Under direct sonographic guidance, the right common femoral artery was punctured with a 21-gauge micropuncture needle.  A micro wire was advanced into the common iliac artery and a 5-French transitional micro sheath advanced over the wire.  A gentle hand injection of contrast material through the micro sheath confirmed placement within the mid common femoral artery.  A Benson wire was advanced into the abdominal aorta and the transitional sheath exchanged for a 5-French working sheath.  The Cobra catheter was then advanced opened over the bifurcation into the left common iliac artery.  An oblique arteriogram was performed to demonstrate the origin of the internal iliac artery. The internal iliac artery was then catheterized and an arteriogram performed demonstrating the origin of the uterine artery.  The uterine artery was then selectively catheterized with a micro catheter and the catheter  superselective we advanced the horizontal segment of the uterine artery.  A final contrast injection confirmed placement in the uterine artery with multiple hypertrophied fibroid branches.  Particle embolization was then performed with the 1.2 vials of 500 - 700 micron embospheres and one vial of 700 - 900 micron embospheres. Embolization was carried to near stasis.  Post embolization arteriogram demonstrated successful pruning of the vasculature with reflux of contrast material.  The micro catheter was then removed. The Cobra catheter was then exchanged over a wire for a RIM catheter.  The RIM catheter was used to select the right internal  iliac artery.  An arteriogram was performed demonstrating origin the uterine artery.  The micro catheter was reintroduced and used to select the right uterine artery.  The initial arteriogram demonstrates a prominent vesicovaginal branch.  The micro catheter was carefully advanced beyond the origin of this branch.  Embolization was then performed using a 0.8 vials of 500 - 700 micron embospheres and one vial of 700 - 900 micron embospheres.  Embolization was carried to near stasis.  Post embolization arteriogram demonstrated successful pruning of the vasculature with reflux of contrast material. The catheters were then removed.  The arterial access site was secured using the Mynx Ace closure device. The patient tolerated the procedure well.  There is no immediate complication.  IMPRESSION:  Technically successful uterine artery embolization.  Signed,  Sterling Big, MD Vascular & Interventional Radiologist [facility] Radiology   Original Report Authenticated By: Malachy Moan, M.D.    Discharge Exam: Blood pressure 171/75, pulse 81, temperature 97.9 F (36.6 C), temperature source Oral, resp. rate 18, last menstrual period 11/21/2012, SpO2 100.00%. Patient is awake, alert and oriented x3. Chest is clear to auscultation bilaterally. Heart with regular rate and  rhythm. Abdomen soft, obese, mild pelvic tenderness to palpation, positive bowel sounds. Extremities with full range of motion and no significant edema. Strength and sensation in all 4 extremities equal and normal. Puncture site right common femoral artery clean, dry, nontender, no hematoma. Intact distal pulses.  Disposition: Home  Discharge Orders   Future Orders Complete By Expires     Call MD for:  difficulty breathing, headache or visual disturbances  As directed     Call MD for:  extreme fatigue  As directed     Call MD for:  hives  As directed     Call MD for:  persistant dizziness or light-headedness  As directed     Call MD for:  persistant nausea and vomiting  As directed     Call MD for:  redness, tenderness, or signs of infection (pain, swelling, redness, odor or green/yellow discharge around incision site)  As directed     Call MD for:  severe uncontrolled pain  As directed     Call MD for:  temperature >100.4  As directed     Diet - low sodium heart healthy  As directed     Scheduling Instructions:      Diabetic diet    Discharge instructions  As directed     Comments:      Radiology dept will call you with follow up appt with Dr. Archer Asa in 4-6 weeks; resume usual home medications; may take over the counter prilosec for heartburn and milk of magnesia for constipation as needed.    Discharge patient  As directed     Discharge wound care:  As directed     Comments:      May wash puncture site rt groin ; may change bandaid daily for next 2-3 days    Driving Restrictions  As directed     Comments:      No driving for 24 hours    Increase activity slowly  As directed     Lifting restrictions  As directed     Comments:      No heavy lifting for 3-4  days    May shower / Bathe  As directed     May walk up steps  As directed     Sexual Activity Restrictions  As directed  Comments:      No sexual intercourse for 1 week        Medication List    TAKE these  medications       acetaminophen 500 MG tablet  Commonly known as:  TYLENOL  Take 1,000 mg by mouth every 6 (six) hours as needed. For pain     aspirin 81 MG chewable tablet  Chew 81 mg by mouth every morning.     BELVIQ 10 MG Tabs  Generic drug:  Lorcaserin HCl  Take 10 mg by mouth every morning.     clopidogrel 75 MG tablet  Commonly known as:  PLAVIX  Take 75 mg by mouth every morning.     docusate sodium 100 MG capsule  Commonly known as:  COLACE  Take 100 mg by mouth daily.     furosemide 20 MG tablet  Commonly known as:  LASIX  Take 20 mg by mouth every morning.     insulin aspart 100 UNIT/ML injection  Commonly known as:  novoLOG  Inject 5-10 Units into the skin See admin instructions. Take 5 units after each meal and 10 units at night as needed.     insulin glargine 100 UNIT/ML injection  Commonly known as:  LANTUS  Inject 60 Units into the skin at bedtime.     lamoTRIgine 100 MG tablet  Commonly known as:  LAMICTAL  Take 100 mg by mouth every morning.     levETIRAcetam 500 MG tablet  Commonly known as:  KEPPRA  Take 250 mg by mouth every 12 (twelve) hours.     multivitamin with minerals Tabs  Take 1 tablet by mouth every morning.     nicotine 21 mg/24hr patch  Commonly known as:  NICODERM CQ - dosed in mg/24 hours  Place 1 patch onto the skin daily as needed.     olmesartan 40 MG tablet  Commonly known as:  BENICAR  Take 40 mg by mouth at bedtime.     potassium chloride 20 MEQ/15ML (10%) solution  Take 20 mEq by mouth every morning.     rosuvastatin 20 MG tablet  Commonly known as:  CRESTOR  Take 20 mg by mouth every morning.           Follow-up Information   Follow up with [facility], Kandis Cocking, MD. (radiology dept will call you with follow up appt with Dr. Archer Asa in 4-6 weeks; call [phone] or [phone] with any questions)    Contact information:   [address] [facility] [place]  [address] [phone]       Signed: Chinita Pester 12/13/2012, 9:01 AM

## 2012-12-17 ENCOUNTER — Telehealth: Payer: Self-pay | Admitting: Radiology

## 2012-12-17 ENCOUNTER — Other Ambulatory Visit: Payer: Self-pay | Admitting: Radiology

## 2012-12-17 DIAGNOSIS — D219 Benign neoplasm of connective and other soft tissue, unspecified: Secondary | ICD-10-CM

## 2012-12-17 LAB — GLUCOSE, CAPILLARY: Glucose-Capillary: 192 mg/dL — ABNORMAL HIGH (ref 70–99)

## 2012-12-17 MED ORDER — HYDROCODONE-ACETAMINOPHEN 5-325 MG PO TABS: 2.0000 | ORAL_TABLET | Freq: Four times a day (QID) | ORAL | Status: DC | PRN

## 2012-12-17 MED ORDER — IBUPROFEN 800 MG PO TABS: 800.0000 mg | ORAL_TABLET | Freq: Four times a day (QID) | ORAL | Status: DC | PRN

## 2012-12-17 NOTE — Telephone Encounter (Signed)
Called to schedule 2-4 wk follow up [place].    Patient c/o intense cramping & nausea since Saturday.  Having menstrual flow/spotting since Sunday.  No foul odor. Moderate relief using Phenergan supp.   Oxycontin has not helped to manage pain.   Afebrile.   Appetite:  Fair.  Dr Archer Asa.  Rx called to patient's pharmacy of choice.  Patient received instructions.

## 2012-12-19 ENCOUNTER — Telehealth: Payer: Self-pay | Admitting: Radiology

## 2012-12-19 ENCOUNTER — Telehealth: Payer: Self-pay | Admitting: Emergency Medicine

## 2012-12-19 NOTE — Progress Notes (Signed)
Left message requesting patient call with status update.  Vernella Niznik Carmell [place], RN 12/19/2012 2:22 PM

## 2012-12-19 NOTE — Telephone Encounter (Signed)
Pt returned  Call , just checking status of her symptoms.  Nausea is better, cycle stopped in the middle of the night, but still having sever cramping. Pt denies odor, fever, or abn discharge.   instructed pt to keep 01-01-13 appt, continue IBU for the cramping, and to call if it becomes worse.

## 2012-12-23 DIAGNOSIS — I6789 Other cerebrovascular disease: Secondary | ICD-10-CM | POA: Diagnosis not present

## 2012-12-23 DIAGNOSIS — E669 Obesity, unspecified: Secondary | ICD-10-CM | POA: Diagnosis not present

## 2012-12-23 DIAGNOSIS — E109 Type 1 diabetes mellitus without complications: Secondary | ICD-10-CM | POA: Diagnosis not present

## 2012-12-23 DIAGNOSIS — G4730 Sleep apnea, unspecified: Secondary | ICD-10-CM | POA: Diagnosis not present

## 2013-01-01 ENCOUNTER — Ambulatory Visit
Admission: RE | Admit: 2013-01-01 | Discharge: 2013-01-01 | Disposition: A | Discharge status: Home / Self Care | Payer: 59 | Source: Ambulatory Visit | Attending: Radiology | Admitting: Radiology

## 2013-01-01 DIAGNOSIS — Z09 Encounter for follow-up examination after completed treatment for conditions other than malignant neoplasm: Secondary | ICD-10-CM | POA: Diagnosis not present

## 2013-01-01 DIAGNOSIS — D259 Leiomyoma of uterus, unspecified: Secondary | ICD-10-CM | POA: Diagnosis not present

## 2013-01-01 NOTE — Progress Notes (Signed)
LMP:  12/15/2012 x 5 days.  No foul odor.  Afebrile.    Cramping x 1-2 weeks post [place] which has resolved. Amiri Riechers Carmell [place], RN 01/01/2013 3:06 PM  Returned to part time work on Saturday, December 28, 2012.

## 2013-02-04 DIAGNOSIS — E119 Type 2 diabetes mellitus without complications: Secondary | ICD-10-CM | POA: Diagnosis not present

## 2013-02-04 DIAGNOSIS — G44209 Tension-type headache, unspecified, not intractable: Secondary | ICD-10-CM | POA: Diagnosis not present

## 2013-03-18 ENCOUNTER — Telehealth: Payer: Self-pay | Admitting: Radiology

## 2013-03-18 NOTE — Telephone Encounter (Signed)
Called patient for 3 month followup [place].  LMP:  03/01/2013.  Patient states that menstrual flow has decreased, normal flow x 4 days, spotting x 3 days.  Minimal to moderate cramping with menses.    States that overall she is doing well.  Will schedule 6 month followup approximately late November 2014.  Ayelet Gruenewald Carmell [place], RN 03/18/2013 11:52 AM

## 2013-05-07 ENCOUNTER — Other Ambulatory Visit (HOSPITAL_COMMUNITY): Payer: Self-pay | Admitting: Interventional Radiology

## 2013-05-07 ENCOUNTER — Telehealth: Payer: Self-pay | Admitting: Radiology

## 2013-05-07 DIAGNOSIS — D219 Benign neoplasm of connective and other soft tissue, unspecified: Secondary | ICD-10-CM

## 2013-05-07 NOTE — Telephone Encounter (Signed)
Patient prefers to schedule 6 mo f/u [place] during December 2014.  Will call patient to schedule when radiologists December schedule is available.   Ladajah Soltys Carmell [place], RN 05/07/2013 2:24 PM

## 2013-05-26 ENCOUNTER — Other Ambulatory Visit (HOSPITAL_COMMUNITY): Payer: Self-pay | Admitting: Family Medicine

## 2013-05-26 DIAGNOSIS — Z139 Encounter for screening, unspecified: Secondary | ICD-10-CM

## 2013-05-29 ENCOUNTER — Ambulatory Visit (HOSPITAL_COMMUNITY)
Admission: RE | Admit: 2013-05-29 | Discharge: 2013-05-29 | Disposition: A | Discharge status: Home / Self Care | Payer: 59 | Source: Ambulatory Visit | Attending: Family Medicine | Admitting: Family Medicine

## 2013-05-29 DIAGNOSIS — Z1231 Encounter for screening mammogram for malignant neoplasm of breast: Secondary | ICD-10-CM | POA: Diagnosis not present

## 2013-05-29 DIAGNOSIS — Z139 Encounter for screening, unspecified: Secondary | ICD-10-CM

## 2013-06-12 ENCOUNTER — Encounter: Payer: Self-pay | Admitting: Cardiology

## 2013-06-12 ENCOUNTER — Ambulatory Visit (INDEPENDENT_AMBULATORY_CARE_PROVIDER_SITE_OTHER): Payer: 59 | Admitting: Cardiology

## 2013-06-12 VITALS — BP 126/84 | HR 94 | Ht 66.5 in | Wt 321.0 lb

## 2013-06-12 DIAGNOSIS — E781 Pure hyperglyceridemia: Secondary | ICD-10-CM | POA: Insufficient documentation

## 2013-06-12 DIAGNOSIS — Q211 Atrial septal defect: Secondary | ICD-10-CM

## 2013-06-12 DIAGNOSIS — I10 Essential (primary) hypertension: Secondary | ICD-10-CM | POA: Insufficient documentation

## 2013-06-12 DIAGNOSIS — I6350 Cerebral infarction due to unspecified occlusion or stenosis of unspecified cerebral artery: Secondary | ICD-10-CM

## 2013-06-12 DIAGNOSIS — I639 Cerebral infarction, unspecified: Secondary | ICD-10-CM | POA: Insufficient documentation

## 2013-06-12 DIAGNOSIS — E785 Hyperlipidemia, unspecified: Secondary | ICD-10-CM

## 2013-06-12 DIAGNOSIS — Q2112 Patent foramen ovale: Secondary | ICD-10-CM | POA: Insufficient documentation

## 2013-06-12 NOTE — Progress Notes (Addendum)
Clinical Summary Ms. Vannest is a 47 y.o.female seen today as a new patient for the following problems. She reports she asked for referral because she was told she had a "hole in her heart" and wanted to follow up if it should be closed or not.    1. 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 Reports history of multiple strokes, up to 5 episodes. Last stroke she reports was Feb 2013 where she had left leg weakness but did seek evaluation. - prior evaluation and management mainly at [facility], including significant bilateral carotid artery stenosis from prior angiogram and evidence of a PFO by TEE in 2009, repeat echoes have not show evidence of PFO.  - had been followed previously by [facility] cardiology and a neurologist in [place], those records are not currently available. She is current on ASA, plavix, and statin for secondary prevention - she has a complex history, but appears they have attributed her CVAs in the past to right internal carotid artery disease as her CVAs have involved the right MCA territory. Per notes given the location of the right internal carotid disease it is not amenable to intervention   2. Hyperlipidemia - followed by PCP, on crestor 20mg  daily.  3. HTN - compliant with meds - checks twice a week, typically 120s/70s Past Medical History  Diagnosis Date  . Hypertension   . Diabetes mellitus   . Seizures   . 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 Hypercholesterolemia   . Fibroids   . Heart murmur      Allergies  Allergen Reactions  . Crab [Shellfish Allergy] Swelling     Current Outpatient Prescriptions  Medication Sig Dispense Refill  . acetaminophen (TYLENOL) 500 MG tablet Take 1,000 mg by mouth every 6 (six) hours as needed. For pain      . aspirin 81 MG chewable tablet Chew 81 mg by mouth every morning.       . clopidogrel (PLAVIX) 75 MG tablet Take 75 mg by mouth every morning.      . docusate sodium (COLACE) 100 MG capsule  Take 100 mg by mouth daily.      . furosemide (LASIX) 20 MG tablet Take 20 mg by mouth every morning.       Marland Kitchen ibuprofen (ADVIL,MOTRIN) 800 MG tablet Take 1 tablet (800 mg total) by mouth every 6 (six) hours as needed for pain.  20 tablet  0  . insulin aspart (NOVOLOG) 100 UNIT/ML injection Inject 5-10 Units into the skin See admin instructions. Take 5 units after each meal and 10 units at night as needed.      . insulin glargine (LANTUS) 100 UNIT/ML injection Inject 60 Units into the skin at bedtime.      . lamoTRIgine (LAMICTAL) 100 MG tablet Take 100 mg by mouth every morning.       . levETIRAcetam (KEPPRA) 500 MG tablet Take 250 mg by mouth every 12 (twelve) hours.      . Lorcaserin HCl (BELVIQ) 10 MG TABS Take 10 mg by mouth every morning.      . Multiple Vitamin (MULITIVITAMIN WITH MINERALS) TABS Take 1 tablet by mouth every morning.       . nicotine (NICODERM CQ - DOSED IN MG/24 HOURS) 21 mg/24hr patch Place 1 patch onto the skin daily as needed.      Marland Kitchen olmesartan (BENICAR) 40 MG tablet Take 40 mg by mouth at bedtime.       Marland Kitchen  potassium chloride 20 MEQ/15ML (10%) solution Take 20 mEq by mouth every morning.      . rosuvastatin (CRESTOR) 20 MG tablet Take 20 mg by mouth every morning.        No current facility-administered medications for this visit.     Past Surgical History  Procedure Laterality Date  . Cholecystectomy    . Cesarean section       Allergies  Allergen Reactions  . Crab [Shellfish Allergy] Swelling      No family history on file.   Social History Ms. Barber reports that she has been smoking Cigarettes.  She has been smoking about 0.25 packs per day. She has never used smokeless tobacco. Ms. Duran reports that she does not drink alcohol.   Review of Systems CONSTITUTIONAL: No weight loss, fever, chills, weakness or fatigue.  HEENT: Eyes: No visual loss, blurred vision, double vision or yellow sclerae.No hearing loss, sneezing, congestion, runny nose or  sore throat.  SKIN: No rash or itching.  CARDIOVASCULAR: no chest pain, no palpitations RESPIRATORY: No shortness of breath, cough or sputum.  GASTROINTESTINAL: No anorexia, nausea, vomiting or diarrhea. No abdominal pain or blood.  GENITOURINARY: No burning on urination, no polyuria MUSCULOSKELETAL: No muscle, back pain, joint pain or stiffness.  LYMPHATICS: No enlarged nodes. No history of splenectomy.  PSYCHIATRIC: No history of depression or anxiety.  ENDOCRINOLOGIC: No reports of sweating, cold or heat intolerance. No polyuria or polydipsia.  Marland Kitchen   Physical Examination p 94 bp 126/84 Wt 321 lbs BMI 51 Gen: resting comfortably, no acute distress HEENT: no scleral icterus, pupils equal round and reactive, no palptable cervical adenopathy,  CV: RRR, no m/r/g, no JVD, no carotid bruits Resp: Clear to auscultation bilaterally GI: abdomen is soft, non-tender, non-distended, normal bowel sounds, no hepatosplenomegaly MSK: extremities are warm, no edema.  Skin: warm, no rash Neuro:  no focal deficits Psych: appropriate affect   Diagnostic Studies 04/2011 Echo: mild LVH, LVEF 60-65%, no WMA,   06/2011 Carotid:  Summary: No significant extracranial carotid artery stenosis demonstrated. Vertebrals are patent with antegrade flow.  April 2009 A TEE performed by Dr. 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 In addition, it also  showed trace TR, but no clots in the left atrium or left ventricle.  There was some Valsalva strain also associated with the right to left  shunting, but strongly positive.   06/12/13 Clinic EKG: sinus rhythm, normal axis, no ischemic changes  Assessment and Plan  1. CVA - patient presents today to follow up on a "hole in her heart" she was told about several years ago. Her medical history is difficult to track through [facility], but appears she had a TEE in 2009 with evidence of a PFO. Follow up TTEs did not show any  evidence.  - from the limited information I have, it appears her strokes have been attributed to severe carotid disease that has been deemed not a candidate for revasculatization because of location. She is on ASA, plavix, and statin. We do not have access to her outpatient neurology notes at this time, nor her outpatient cardiology notes - at this point there is no indication to consider closing her PFO, she has a clear alternative source for her CVAs. - patient will follow up with her new neurologist next month, will follow up on there evaluation of her.  2. Hyperlipidemia - continue management by her PCP  3. HTN: at goal, continued management  by her PCP   F/u 6 months   Antoine Poche, M.D., F.A.C.C.

## 2013-06-12 NOTE — Patient Instructions (Signed)
Continue all current medications. Your physician wants you to follow up in: 6 months.  You will receive a reminder letter in the mail one-two months in advance.  If you don't receive a letter, please call our office to schedule the follow up appointment   

## 2013-06-16 ENCOUNTER — Ambulatory Visit: Payer: 59 | Admitting: Cardiology

## 2013-06-16 ENCOUNTER — Encounter: Payer: Self-pay | Admitting: Cardiology

## 2013-07-22 ENCOUNTER — Other Ambulatory Visit (HOSPITAL_COMMUNITY): Payer: Self-pay | Admitting: Neurology

## 2013-07-22 DIAGNOSIS — I6789 Other cerebrovascular disease: Secondary | ICD-10-CM | POA: Diagnosis not present

## 2013-07-22 DIAGNOSIS — R569 Unspecified convulsions: Secondary | ICD-10-CM | POA: Diagnosis not present

## 2013-07-30 DIAGNOSIS — M6281 Muscle weakness (generalized): Secondary | ICD-10-CM | POA: Diagnosis not present

## 2013-07-30 DIAGNOSIS — R42 Dizziness and giddiness: Secondary | ICD-10-CM | POA: Diagnosis not present

## 2013-07-30 DIAGNOSIS — R269 Unspecified abnormalities of gait and mobility: Secondary | ICD-10-CM | POA: Diagnosis not present

## 2013-08-06 ENCOUNTER — Inpatient Hospital Stay (HOSPITAL_COMMUNITY): Admission: RE | Admit: 2013-08-06 | Payer: 59 | Source: Ambulatory Visit

## 2013-08-13 ENCOUNTER — Ambulatory Visit (HOSPITAL_COMMUNITY)
Admission: RE | Admit: 2013-08-13 | Discharge: 2013-08-13 | Disposition: A | Discharge status: Home / Self Care | Payer: 59 | Source: Ambulatory Visit | Attending: Neurology | Admitting: Neurology

## 2013-08-13 DIAGNOSIS — Z7982 Long term (current) use of aspirin: Secondary | ICD-10-CM | POA: Insufficient documentation

## 2013-08-13 DIAGNOSIS — R109 Unspecified abdominal pain: Secondary | ICD-10-CM | POA: Diagnosis not present

## 2013-08-13 DIAGNOSIS — Z8673 Personal history of transient ischemic attack (TIA), and cerebral infarction without residual deficits: Secondary | ICD-10-CM | POA: Insufficient documentation

## 2013-08-13 DIAGNOSIS — R209 Unspecified disturbances of skin sensation: Secondary | ICD-10-CM | POA: Insufficient documentation

## 2013-08-13 DIAGNOSIS — Z7902 Long term (current) use of antithrombotics/antiplatelets: Secondary | ICD-10-CM | POA: Diagnosis not present

## 2013-08-13 DIAGNOSIS — R000 Tachycardia, unspecified: Secondary | ICD-10-CM | POA: Insufficient documentation

## 2013-08-13 DIAGNOSIS — R569 Unspecified convulsions: Secondary | ICD-10-CM | POA: Diagnosis not present

## 2013-08-13 DIAGNOSIS — Z794 Long term (current) use of insulin: Secondary | ICD-10-CM | POA: Insufficient documentation

## 2013-08-13 DIAGNOSIS — Z79899 Other long term (current) drug therapy: Secondary | ICD-10-CM | POA: Insufficient documentation

## 2013-08-13 NOTE — Progress Notes (Signed)
EEG Completed; Results Pending  

## 2013-09-01 DIAGNOSIS — R269 Unspecified abnormalities of gait and mobility: Secondary | ICD-10-CM | POA: Diagnosis not present

## 2013-09-01 DIAGNOSIS — R42 Dizziness and giddiness: Secondary | ICD-10-CM | POA: Diagnosis not present

## 2013-09-01 DIAGNOSIS — M6281 Muscle weakness (generalized): Secondary | ICD-10-CM | POA: Diagnosis not present

## 2013-09-03 NOTE — Procedures (Signed)
[place] A. Merlene Laughter, MD     [email or web address]          NAMEMARIN, [name]               ACCOUNT NO.:  [id number]  MEDICAL RECORD NO.:  [id number]  LOCATION:                                 FACILITY:  PHYSICIAN:  Kedra Mcglade A. Merlene Laughter, M.D. DATE OF BIRTH:  Oct 08, 1965  DATE OF PROCEDURE: DATE OF DISCHARGE:                             EEG INTERPRETATION   INDICATION:  This is a 48 year old lady who presents with recurrent spells suspicious for seizure activity characterized by abdominal discomfort, raised in heart rate, and tingling.  There is a history of strokes in the past.  MEDICATIONS:  Aspirin, Tylenol, Plavix, Colace, felodipine, Lasix, ibuprofen, insulin, Lamictal, Keppra, metformin, multivitamin, nicotine, Benicar, Actos, Crestor.  ANALYSIS:  A 16-channel recording using standard 10/20 measurement is carried out for 20 minutes.  There is a well-formed posterior dominant rhythm of 10 hertz, which attenuates with eye opening.  There is beta activity observed in the frontal areas.  Awake and drowsy activities are recorded.  Photic stimulation is carried out without abnormal changes in the background activity.  The patient has occasional frontal intermittent rhythmic delta activity.  There is no focal or lateralized slowing.  There is no epileptiform discharges observed.  IMPRESSION:  Occasional frontal intermittent rhythmic delta activity and typically seen in mild metabolic encephalopathies.  Otherwise, recording is unremarkable.  There is no epileptiform discharges observed.     Layton Tappan A. Merlene Laughter, M.D.     KAD/MEDQ  D:  08/15/2013  T:  08/16/2013  Job:  [id number]

## 2013-09-15 DIAGNOSIS — E119 Type 2 diabetes mellitus without complications: Secondary | ICD-10-CM | POA: Diagnosis not present

## 2013-09-19 DIAGNOSIS — R413 Other amnesia: Secondary | ICD-10-CM

## 2013-09-19 DIAGNOSIS — I6350 Cerebral infarction due to unspecified occlusion or stenosis of unspecified cerebral artery: Secondary | ICD-10-CM

## 2013-09-24 DIAGNOSIS — I6350 Cerebral infarction due to unspecified occlusion or stenosis of unspecified cerebral artery: Secondary | ICD-10-CM

## 2013-09-24 DIAGNOSIS — R413 Other amnesia: Secondary | ICD-10-CM

## 2013-11-24 ENCOUNTER — Ambulatory Visit (INDEPENDENT_AMBULATORY_CARE_PROVIDER_SITE_OTHER): Payer: 59 | Admitting: Cardiology

## 2013-11-24 ENCOUNTER — Encounter: Payer: Self-pay | Admitting: Cardiology

## 2013-11-24 VITALS — BP 133/81 | HR 82 | Ht 66.0 in | Wt 321.8 lb

## 2013-11-24 DIAGNOSIS — I6350 Cerebral infarction due to unspecified occlusion or stenosis of unspecified cerebral artery: Secondary | ICD-10-CM

## 2013-11-24 DIAGNOSIS — I639 Cerebral infarction, unspecified: Secondary | ICD-10-CM

## 2013-11-24 DIAGNOSIS — E785 Hyperlipidemia, unspecified: Secondary | ICD-10-CM

## 2013-11-24 DIAGNOSIS — I10 Essential (primary) hypertension: Secondary | ICD-10-CM | POA: Diagnosis not present

## 2013-11-24 MED ORDER — RANITIDINE HCL 150 MG PO TABS: 150.0000 mg | ORAL_TABLET | Freq: Two times a day (BID) | ORAL | Status: DC

## 2013-11-24 NOTE — Patient Instructions (Signed)
   Begin Zantac 150mg  twice a day  - OTC Continue all other medications.   Your physician wants you to follow up in:  1 year.  You will receive a reminder letter in the mail one-two months in advance.  If you don't receive a letter, please call our office to schedule the follow up appointment

## 2013-11-24 NOTE — Progress Notes (Signed)
Clinical Summary Jill [name] is a 48 y.o.female seen today for follow up of the following medical problems.   1. CVA  - first stoke 04/2008. Reports history of multiple strokes, up to 5 episodes. Last stroke she reports was Feb 2013 where she had left leg weakness but did not seek evaluation.  - prior evaluation and management mainly at [facility], including significant bilateral carotid artery stenosis from prior angiogram and evidence of a PFO by TEE in 2009, repeat echoes have not show evidence of PFO.  - had been followed previously by [facility] cardiology and a neurologist in [place]. She is currently on ASA, plavix, and statin for secondary prevention  - she has a complex history, but appears they have attributed her CVAs in the past to right internal carotid artery disease as her CVAs have involved the right MCA territory. Per notes given the location of the right internal carotid disease it is not amenable to intervention   - no recent symptoms or events since last visit.   2. Hyperlipidemia  - followed by PCP, on crestor 20mg  daily.  - reports recent panel with pcp a few weeks ago  3. HTN  - compliant with meds  - checks twice a week, typically 120s/80s  4. PAD - prior right lower intervention with PTA and stenting of right common iliac and right SFA in Jan 2010 - denies any claudication  5. OSA - compliant with CPAP  6. Chest pain - burning feeling midchest, 7/10. No other symptoms. Nothing makes worst. Lasts until she takes alka-seltzer and helps instantly. Occurs 4 times a week. Often after meals, spicy foods.   7. Tobacco - <1 ppd. Has patches but has not used them.  - she is not ready to quit   Past Medical History  Diagnosis Date  . Hypertension   . Diabetes mellitus   . Seizures   . 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 Hypercholesterolemia   . Fibroids   . Heart murmur      Allergies  Allergen Reactions  . Crab  [Shellfish Allergy] Swelling     Current Outpatient Prescriptions  Medication Sig Dispense Refill  . acetaminophen (TYLENOL) 500 MG tablet Take 1,000 mg by mouth every 6 (six) hours as needed. For pain      . aspirin 81 MG chewable tablet Chew 81 mg by mouth every morning.       . clopidogrel (PLAVIX) 75 MG tablet Take 75 mg by mouth every morning.      . docusate sodium (COLACE) 100 MG capsule Take 100 mg by mouth daily.      . felodipine (PLENDIL) 5 MG 24 hr tablet Take 5 mg by mouth daily.      . furosemide (LASIX) 20 MG tablet Take 20 mg by mouth every morning.       Marland Kitchen ibuprofen (ADVIL,MOTRIN) 800 MG tablet Take 1 tablet (800 mg total) by mouth every 6 (six) hours as needed for pain.  20 tablet  0  . insulin aspart (NOVOLOG) 100 UNIT/ML injection Inject 10-15 Units into the skin See admin instructions.       . insulin detemir (LEVEMIR) 100 UNIT/ML injection Inject 60 Units into the skin at bedtime.      . lamoTRIgine (LAMICTAL) 100 MG tablet Take 100 mg by mouth every morning.       . levETIRAcetam (KEPPRA) 500 MG tablet Take 250 mg by mouth  every 12 (twelve) hours.      . metFORMIN (GLUCOPHAGE) 1000 MG tablet Take 1,000 mg by mouth 2 (two) times daily with a meal.      . Multiple Vitamin (MULITIVITAMIN WITH MINERALS) TABS Take 1 tablet by mouth every morning.       . nicotine (NICODERM CQ - DOSED IN MG/24 HOURS) 21 mg/24hr patch Place 1 patch onto the skin daily as needed.      Marland Kitchen olmesartan (BENICAR) 40 MG tablet Take 40 mg by mouth at bedtime.       . pioglitazone (ACTOS) 15 MG tablet Take 15 mg by mouth daily.      . potassium chloride SA (K-DUR,KLOR-CON) 20 MEQ tablet Take 20 mEq by mouth daily.      . rosuvastatin (CRESTOR) 20 MG tablet Take 20 mg by mouth every morning.        No current facility-administered medications for this visit.     Past Surgical History  Procedure Laterality Date  . Cholecystectomy    . Cesarean section       Allergies  Allergen Reactions  .  Crab [Shellfish Allergy] Swelling      No family history on file.   Social History [name] reports that she has been smoking Cigarettes.  She started smoking about 38 years ago. She has a 5 pack-year smoking history. She has never used smokeless tobacco. [name] reports that she does not drink alcohol.   Review of Systems CONSTITUTIONAL: No weight loss, fever, chills, weakness or fatigue.  HEENT: Eyes: No visual loss, blurred vision, double vision or yellow sclerae.No hearing loss, sneezing, congestion, runny nose or sore throat.  SKIN: No rash or itching.  CARDIOVASCULAR: per HPI RESPIRATORY: No shortness of breath, cough or sputum.  GASTROINTESTINAL: No anorexia, nausea, vomiting or diarrhea. No abdominal pain or blood.  GENITOURINARY: No burning on urination, no polyuria NEUROLOGICAL: No headache, dizziness, syncope, paralysis, ataxia, numbness or tingling in the extremities. No change in bowel or bladder control.  MUSCULOSKELETAL: No muscle, back pain, joint pain or stiffness.  LYMPHATICS: No enlarged nodes. No history of splenectomy.  PSYCHIATRIC: No history of depression or anxiety.  ENDOCRINOLOGIC: No reports of sweating, cold or heat intolerance. No polyuria or polydipsia.  Marland Kitchen   Physical Examination p 82 bp 133/81 Wt 321 lbs BMI 52 Gen: resting comfortably, no acute distress HEENT: no scleral icterus, pupils equal round and reactive, no palptable cervical adenopathy,  CV: RRR, no m/r/g, no JVD, no carotid bruits Resp: Clear to auscultation bilaterally GI: abdomen is soft, non-tender, non-distended, normal bowel sounds, no hepatosplenomegaly MSK: extremities are warm, no edema.  Skin: warm, no rash Neuro:  no focal deficits Psych: appropriate affect   Diagnostic Studies 04/2011 Echo: mild LVH, LVEF 60-65%, no WMA,   06/2011 Carotid:  Summary:  No significant extracranial carotid artery stenosis  demonstrated. Vertebrals are patent with antegrade flow.    April 2009  A TEE performed by Dr. 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 In addition, it also  showed trace TR, but no clots in the left atrium or left ventricle.  There was some Valsalva strain also associated with the right to left  shunting, but strongly positive.   06/12/13 Clinic EKG: sinus rhythm, normal axis, no ischemic changes    Assessment and Plan  1. CVA  - patient presents today to follow up on a "hole in her heart" she was told about several  years ago. Her medical history is difficult to track through [facility], but appears she had a TEE in 2009 with evidence of a PFO. Follow up TTEs did not show any evidence.  - from the limited information I have, it appears her strokes have been attributed to severe carotid disease that has been deemed not a candidate for revasculatization because of location. She is on ASA, plavix, and statin with no recent recurrence - at this point there is no indication to consider closing her PFO, she has a clear alternative source for her CVAs. Continue current medical therapy.    2. Hyperlipidemia  - continue management by her PCP   3. HTN: at goal, continued management by her PCP   F/u 1 year  [name], M.D., F.A.C.C.

## 2013-12-29 DIAGNOSIS — E119 Type 2 diabetes mellitus without complications: Secondary | ICD-10-CM | POA: Diagnosis not present

## 2013-12-29 DIAGNOSIS — G44209 Tension-type headache, unspecified, not intractable: Secondary | ICD-10-CM | POA: Diagnosis not present

## 2014-01-15 DIAGNOSIS — E669 Obesity, unspecified: Secondary | ICD-10-CM | POA: Diagnosis not present

## 2014-01-15 DIAGNOSIS — R569 Unspecified convulsions: Secondary | ICD-10-CM | POA: Diagnosis not present

## 2014-01-15 DIAGNOSIS — I6789 Other cerebrovascular disease: Secondary | ICD-10-CM | POA: Diagnosis not present

## 2014-04-07 DIAGNOSIS — E119 Type 2 diabetes mellitus without complications: Secondary | ICD-10-CM | POA: Diagnosis not present

## 2014-04-22 DIAGNOSIS — F09 Unspecified mental disorder due to known physiological condition: Secondary | ICD-10-CM | POA: Diagnosis not present

## 2014-04-22 DIAGNOSIS — R569 Unspecified convulsions: Secondary | ICD-10-CM | POA: Diagnosis not present

## 2014-04-22 DIAGNOSIS — R269 Unspecified abnormalities of gait and mobility: Secondary | ICD-10-CM | POA: Diagnosis not present

## 2014-04-24 ENCOUNTER — Other Ambulatory Visit (HOSPITAL_COMMUNITY): Payer: Self-pay | Admitting: Family Medicine

## 2014-04-24 DIAGNOSIS — Z1231 Encounter for screening mammogram for malignant neoplasm of breast: Secondary | ICD-10-CM

## 2014-04-26 DIAGNOSIS — Z23 Encounter for immunization: Secondary | ICD-10-CM | POA: Diagnosis not present

## 2014-06-01 ENCOUNTER — Ambulatory Visit (HOSPITAL_COMMUNITY)
Admission: RE | Admit: 2014-06-01 | Discharge: 2014-06-01 | Disposition: A | Discharge status: Home / Self Care | Payer: BC Managed Care – PPO | Source: Ambulatory Visit | Attending: Family Medicine | Admitting: Family Medicine

## 2014-06-01 DIAGNOSIS — Z1231 Encounter for screening mammogram for malignant neoplasm of breast: Secondary | ICD-10-CM | POA: Insufficient documentation

## 2014-08-03 ENCOUNTER — Other Ambulatory Visit (HOSPITAL_COMMUNITY): Payer: Self-pay | Admitting: Family Medicine

## 2014-08-03 DIAGNOSIS — R809 Proteinuria, unspecified: Secondary | ICD-10-CM

## 2014-08-05 ENCOUNTER — Ambulatory Visit (HOSPITAL_COMMUNITY)
Admission: RE | Admit: 2014-08-05 | Discharge: 2014-08-05 | Disposition: A | Discharge status: Home / Self Care | Payer: BLUE CROSS/BLUE SHIELD | Source: Ambulatory Visit | Attending: Family Medicine | Admitting: Family Medicine

## 2014-08-05 DIAGNOSIS — I129 Hypertensive chronic kidney disease with stage 1 through stage 4 chronic kidney disease, or unspecified chronic kidney disease: Secondary | ICD-10-CM | POA: Diagnosis not present

## 2014-08-05 DIAGNOSIS — I10 Essential (primary) hypertension: Secondary | ICD-10-CM | POA: Diagnosis not present

## 2014-08-05 DIAGNOSIS — E1122 Type 2 diabetes mellitus with diabetic chronic kidney disease: Secondary | ICD-10-CM | POA: Diagnosis not present

## 2014-08-05 DIAGNOSIS — R809 Proteinuria, unspecified: Secondary | ICD-10-CM | POA: Insufficient documentation

## 2014-08-05 DIAGNOSIS — E119 Type 2 diabetes mellitus without complications: Secondary | ICD-10-CM | POA: Diagnosis not present

## 2014-08-24 DIAGNOSIS — K219 Gastro-esophageal reflux disease without esophagitis: Secondary | ICD-10-CM | POA: Diagnosis not present

## 2014-08-24 DIAGNOSIS — Z6841 Body Mass Index (BMI) 40.0 and over, adult: Secondary | ICD-10-CM | POA: Diagnosis not present

## 2014-08-24 DIAGNOSIS — I679 Cerebrovascular disease, unspecified: Secondary | ICD-10-CM | POA: Diagnosis not present

## 2014-08-24 DIAGNOSIS — M2550 Pain in unspecified joint: Secondary | ICD-10-CM | POA: Diagnosis not present

## 2014-08-24 DIAGNOSIS — Z720 Tobacco use: Secondary | ICD-10-CM | POA: Diagnosis not present

## 2014-08-24 DIAGNOSIS — Z8673 Personal history of transient ischemic attack (TIA), and cerebral infarction without residual deficits: Secondary | ICD-10-CM | POA: Diagnosis not present

## 2014-08-24 DIAGNOSIS — E119 Type 2 diabetes mellitus without complications: Secondary | ICD-10-CM | POA: Diagnosis not present

## 2014-08-24 DIAGNOSIS — I10 Essential (primary) hypertension: Secondary | ICD-10-CM | POA: Diagnosis not present

## 2014-08-24 DIAGNOSIS — E785 Hyperlipidemia, unspecified: Secondary | ICD-10-CM | POA: Diagnosis not present

## 2014-08-24 DIAGNOSIS — I6789 Other cerebrovascular disease: Secondary | ICD-10-CM | POA: Diagnosis not present

## 2014-08-27 ENCOUNTER — Telehealth: Payer: Self-pay

## 2014-08-27 ENCOUNTER — Other Ambulatory Visit: Payer: Self-pay | Admitting: Cardiology

## 2014-08-27 ENCOUNTER — Telehealth: Payer: Self-pay | Admitting: Cardiology

## 2014-08-27 DIAGNOSIS — Z01818 Encounter for other preprocedural examination: Secondary | ICD-10-CM

## 2014-08-27 DIAGNOSIS — R072 Precordial pain: Secondary | ICD-10-CM

## 2014-08-27 NOTE — Telephone Encounter (Signed)
Per Dr branch at request of [facility], schedule 2 day Lexiscan for pre-op eval Pt to HOLD Felodipine day of test Kerin Ransom to schedule test and inform patient Mailed instructions to patient

## 2014-08-27 NOTE — Telephone Encounter (Signed)
Patient being worked for possible gastric bypass, as part of preop protocol they ordered a 2 day Lexiscan MPI. Patient unable to get to [facility] for study, more convenient to have study done here. Will order Lexiscan MPI 2 day protocol.     Zandra Abts MD

## 2014-09-03 ENCOUNTER — Encounter: Payer: Self-pay | Admitting: *Deleted

## 2014-09-03 DIAGNOSIS — Z6841 Body Mass Index (BMI) 40.0 and over, adult: Secondary | ICD-10-CM | POA: Diagnosis not present

## 2014-09-07 ENCOUNTER — Inpatient Hospital Stay (HOSPITAL_COMMUNITY): Admission: RE | Admit: 2014-09-07 | Payer: BLUE CROSS/BLUE SHIELD | Source: Ambulatory Visit

## 2014-09-07 ENCOUNTER — Encounter (HOSPITAL_COMMUNITY): Payer: BLUE CROSS/BLUE SHIELD

## 2014-09-08 ENCOUNTER — Encounter (HOSPITAL_COMMUNITY): Payer: BLUE CROSS/BLUE SHIELD

## 2014-09-08 ENCOUNTER — Ambulatory Visit: Payer: BLUE CROSS/BLUE SHIELD | Admitting: Urology

## 2014-09-08 ENCOUNTER — Other Ambulatory Visit: Payer: Self-pay | Admitting: General Surgery

## 2014-09-08 DIAGNOSIS — I679 Cerebrovascular disease, unspecified: Secondary | ICD-10-CM

## 2014-09-08 DIAGNOSIS — Z8673 Personal history of transient ischemic attack (TIA), and cerebral infarction without residual deficits: Secondary | ICD-10-CM

## 2014-09-08 DIAGNOSIS — E785 Hyperlipidemia, unspecified: Secondary | ICD-10-CM

## 2014-09-11 ENCOUNTER — Encounter (INDEPENDENT_AMBULATORY_CARE_PROVIDER_SITE_OTHER): Payer: Self-pay

## 2014-09-11 ENCOUNTER — Ambulatory Visit
Admission: RE | Admit: 2014-09-11 | Discharge: 2014-09-11 | Disposition: A | Discharge status: Home / Self Care | Payer: BLUE CROSS/BLUE SHIELD | Source: Ambulatory Visit | Attending: General Surgery | Admitting: General Surgery

## 2014-09-11 DIAGNOSIS — I6521 Occlusion and stenosis of right carotid artery: Secondary | ICD-10-CM | POA: Diagnosis not present

## 2014-09-11 DIAGNOSIS — Z8673 Personal history of transient ischemic attack (TIA), and cerebral infarction without residual deficits: Secondary | ICD-10-CM

## 2014-09-11 DIAGNOSIS — E785 Hyperlipidemia, unspecified: Secondary | ICD-10-CM

## 2014-09-11 DIAGNOSIS — I679 Cerebrovascular disease, unspecified: Secondary | ICD-10-CM

## 2014-09-14 ENCOUNTER — Telehealth (HOSPITAL_COMMUNITY): Payer: Self-pay | Admitting: Interventional Radiology

## 2014-09-14 NOTE — Telephone Encounter (Signed)
Called pt, left VM for her to call me back to schedule cath angio JM

## 2014-09-16 ENCOUNTER — Encounter (HOSPITAL_COMMUNITY)
Admission: RE | Admit: 2014-09-16 | Discharge: 2014-09-16 | Disposition: A | Discharge status: Home / Self Care | Payer: BLUE CROSS/BLUE SHIELD | Source: Ambulatory Visit | Attending: Cardiology | Admitting: Cardiology

## 2014-09-16 ENCOUNTER — Encounter (HOSPITAL_COMMUNITY): Payer: Self-pay

## 2014-09-16 ENCOUNTER — Ambulatory Visit (HOSPITAL_COMMUNITY)
Admission: RE | Admit: 2014-09-16 | Discharge: 2014-09-16 | Disposition: A | Discharge status: Home / Self Care | Payer: BLUE CROSS/BLUE SHIELD | Source: Ambulatory Visit | Attending: Cardiology | Admitting: Cardiology

## 2014-09-16 DIAGNOSIS — Z01818 Encounter for other preprocedural examination: Secondary | ICD-10-CM

## 2014-09-16 DIAGNOSIS — R079 Chest pain, unspecified: Secondary | ICD-10-CM | POA: Insufficient documentation

## 2014-09-16 DIAGNOSIS — R072 Precordial pain: Secondary | ICD-10-CM

## 2014-09-16 DIAGNOSIS — I259 Chronic ischemic heart disease, unspecified: Secondary | ICD-10-CM | POA: Insufficient documentation

## 2014-09-16 MED ORDER — REGADENOSON 0.4 MG/5ML IV SOLN
INTRAVENOUS | Status: AC
  Administered 2014-09-16: 0.4 mg via INTRAVENOUS
  Filled 2014-09-16: qty 5

## 2014-09-16 MED ORDER — TECHNETIUM TC 99M SESTAMIBI - CARDIOLITE
30.0000 | Freq: Once | INTRAVENOUS | Status: AC | PRN
  Administered 2014-09-16: 12:00:00 30 via INTRAVENOUS

## 2014-09-16 MED ORDER — SODIUM CHLORIDE 0.9 % IJ SOLN
10.0000 mL | INTRAMUSCULAR | Status: DC | PRN
  Administered 2014-09-16: 10 mL via INTRAVENOUS
  Filled 2014-09-16: qty 10

## 2014-09-16 MED ORDER — REGADENOSON 0.4 MG/5ML IV SOLN
0.4000 mg | Freq: Once | INTRAVENOUS | Status: AC | PRN
  Administered 2014-09-16: 0.4 mg via INTRAVENOUS

## 2014-09-16 MED ORDER — SODIUM CHLORIDE 0.9 % IJ SOLN
INTRAMUSCULAR | Status: AC
  Administered 2014-09-16: 10 mL via INTRAVENOUS
  Filled 2014-09-16: qty 3

## 2014-09-16 NOTE — Progress Notes (Signed)
Stress Lab Nurses Notes - Jill [name]  Jill [name] 09/16/2014 Reason for doing test: Surgical Clearance Type of test: Jill [name] Nurse performing test: Gerrit Halls, RN Nuclear Medicine Tech: Dyanne Carrel Echo Tech: Not Applicable MD performing test: Branch/M.Lenze PA Family MD: Dr. Hill/ Dr. Florene Glen Test explained and consent signed: Yes.   IV started: Saline lock flushed, No redness or edema and Saline lock started in radiology Symptoms: Flushed "Feeling a rush" nt/Intervention: None Reason test stopped: protocol completed After recovery IV was: Discontinued via X-ray tech and No redness or edema Patient to return to Nuc. Med at : 12:45 Patient discharged: Home Patient's Condition upon discharge was: stable Comments: During test peak BP 124/51 & HR 88.  Recovery BP 125/58 & HR 73.  Symptoms resolved in recovery. [name] T

## 2014-09-17 ENCOUNTER — Encounter (HOSPITAL_COMMUNITY)
Admission: RE | Admit: 2014-09-17 | Discharge: 2014-09-17 | Disposition: A | Discharge status: Home / Self Care | Payer: BLUE CROSS/BLUE SHIELD | Source: Ambulatory Visit | Attending: Cardiology | Admitting: Cardiology

## 2014-09-17 ENCOUNTER — Encounter (HOSPITAL_COMMUNITY): Payer: Self-pay

## 2014-09-17 DIAGNOSIS — I259 Chronic ischemic heart disease, unspecified: Secondary | ICD-10-CM | POA: Diagnosis not present

## 2014-09-17 DIAGNOSIS — R079 Chest pain, unspecified: Secondary | ICD-10-CM | POA: Diagnosis present

## 2014-09-17 MED ORDER — TECHNETIUM TC 99M SESTAMIBI GENERIC - CARDIOLITE
25.0000 | Freq: Once | INTRAVENOUS | Status: AC | PRN
  Administered 2014-09-17: 25 via INTRAVENOUS

## 2014-09-18 ENCOUNTER — Encounter: Payer: Self-pay | Admitting: *Deleted

## 2014-09-18 ENCOUNTER — Telehealth: Payer: Self-pay

## 2014-09-18 NOTE — Telephone Encounter (Signed)
Notes Recorded by [name], MD on 09/18/2014 at 3:02 PM Stress test suggests there is a blockage likely in one of the arteries of her heart. The test was done as part of workup for possible gastric bypass. She likely will need a cath. We have not seen her since 11/2013, can she come see me in [place] next week or Curt Bears to discuss in further detail and evaluate any potential symptoms further.  Zandra Abts MD    I spoke with patient,she will see Dr Harl Bowie next week in [place] office, Lavella Lemons to schedule apt

## 2014-09-21 ENCOUNTER — Encounter: Payer: Self-pay | Admitting: Cardiology

## 2014-09-21 ENCOUNTER — Other Ambulatory Visit (HOSPITAL_COMMUNITY): Payer: Self-pay | Admitting: Interventional Radiology

## 2014-09-21 ENCOUNTER — Encounter: Payer: Self-pay | Admitting: *Deleted

## 2014-09-21 ENCOUNTER — Telehealth: Payer: Self-pay | Admitting: Cardiology

## 2014-09-21 ENCOUNTER — Ambulatory Visit (INDEPENDENT_AMBULATORY_CARE_PROVIDER_SITE_OTHER): Payer: BLUE CROSS/BLUE SHIELD | Admitting: Cardiology

## 2014-09-21 VITALS — BP 137/81 | HR 80 | Ht 66.0 in | Wt 313.0 lb

## 2014-09-21 DIAGNOSIS — Z01818 Encounter for other preprocedural examination: Secondary | ICD-10-CM

## 2014-09-21 DIAGNOSIS — E785 Hyperlipidemia, unspecified: Secondary | ICD-10-CM | POA: Diagnosis not present

## 2014-09-21 DIAGNOSIS — R9439 Abnormal result of other cardiovascular function study: Secondary | ICD-10-CM

## 2014-09-21 DIAGNOSIS — I10 Essential (primary) hypertension: Secondary | ICD-10-CM

## 2014-09-21 DIAGNOSIS — I771 Stricture of artery: Secondary | ICD-10-CM

## 2014-09-21 NOTE — Progress Notes (Addendum)
Clinical Summary Ms. Romick is a 49 y.o.female  Seen today for follow up of the following medical problems.  1. Abnormal stress test/Chest pain - last visit described burning feeling midchest, 7/10. No other symptoms. Nothing makes worst. Lasts until she takes alka-seltzer and helps instantly. Occurs 4 times a week. Often after meals, spicy foods.  - symptoms were thought to be GI related and were followed clinically - she is being worked up for bariatric surgery at [facility], as part of preop eval was sent for nuclear stress test.  - Lexiscan MPI which came back high risk with large area of ischemia in the anteroseptal wall. - despite her age strong history of vascular disease including cerebral vascular disease and PAD.  - denies any recentchest pain, seems to have improved since starting zantac. No SOB or DOE. Can walk up flight of stairs without stopping. Building up endurance on treadmill, up to 8 minutes.    2. Hyperlipidemia  - followed by PCP, on crestor 20mg  daily.    3. HTN  - compliant with meds   4. PAD - prior right lower intervention with PTA and stenting of right common iliac and right SFA in Jan 2010 - denies any claudication  5. OSA - compliant with CPAP  6. Tobacco - continues to smoke      Past Medical History  Diagnosis Date  . Hypertension   . Diabetes mellitus   . Seizures   . 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 Hypercholesterolemia   . Fibroids   . Heart murmur      Allergies  Allergen Reactions  . Crab [Shellfish Allergy] Swelling     Current Outpatient Prescriptions  Medication Sig Dispense Refill  . acetaminophen (TYLENOL) 500 MG tablet Take 1,000 mg by mouth every 6 (six) hours as needed. For pain    . aspirin 81 MG tablet Take 81 mg by mouth daily.    . clopidogrel (PLAVIX) 75 MG tablet Take 75 mg by mouth every morning.    . docusate sodium (COLACE) 100 MG capsule Take 100 mg by mouth daily.      . felodipine (PLENDIL) 5 MG 24 hr tablet Take 5 mg by mouth daily.    . furosemide (LASIX) 80 MG tablet Take 40 mg by mouth daily.    Marland Kitchen ibuprofen (ADVIL,MOTRIN) 800 MG tablet Take 1 tablet (800 mg total) by mouth every 6 (six) hours as needed for pain. 20 tablet 0  . insulin aspart (NOVOLOG) 100 UNIT/ML injection Inject 10-15 Units into the skin at bedtime.     . insulin detemir (LEVEMIR) 100 UNIT/ML injection Inject 60 Units into the skin at bedtime.    . lamoTRIgine (LAMICTAL) 100 MG tablet Take 100 mg by mouth every morning.     . levETIRAcetam (KEPPRA) 500 MG tablet Take 250 mg by mouth every 12 (twelve) hours.    . metFORMIN (GLUCOPHAGE) 1000 MG tablet Take 1,000 mg by mouth 2 (two) times daily with a meal.    . Multiple Vitamin (MULITIVITAMIN WITH MINERALS) TABS Take 1 tablet by mouth every morning.     . nicotine (NICODERM CQ - DOSED IN MG/24 HOURS) 21 mg/24hr patch Place 1 patch onto the skin daily as needed.    Marland Kitchen olmesartan (BENICAR) 40 MG tablet Take 40 mg by mouth every morning.     . pioglitazone (ACTOS) 15 MG tablet Take 15 mg by mouth every morning.     Marland Kitchen  potassium chloride SA (K-DUR,KLOR-CON) 20 MEQ tablet Take 40 mEq by mouth daily.     . ranitidine (ZANTAC) 150 MG tablet Take 1 tablet (150 mg total) by mouth 2 (two) times daily.    . rosuvastatin (CRESTOR) 20 MG tablet Take 20 mg by mouth every morning.     . Sodium Bicarbonate-Citric Acid (ALKA-SELTZER HEARTBURN PO) Take 1 tablet by mouth as needed (2-3 x per day).     No current facility-administered medications for this visit.     Past Surgical History  Procedure Laterality Date  . Cholecystectomy    . Cesarean section       Allergies  Allergen Reactions  . Crab [Shellfish Allergy] Swelling      No family history on file.   Social History Ms. Kimberley reports that she has been smoking Cigarettes.  She started smoking about 39 years ago. She has a 5 pack-year smoking history. She has never used smokeless  tobacco. Ms. Dettloff reports that she does not drink alcohol.   Review of Systems CONSTITUTIONAL: No weight loss, fever, chills, weakness or fatigue.  HEENT: Eyes: No visual loss, blurred vision, double vision or yellow sclerae.No hearing loss, sneezing, congestion, runny nose or sore throat.  SKIN: No rash or itching.  CARDIOVASCULAR: per HPI RESPIRATORY: No shortness of breath, cough or sputum.  GASTROINTESTINAL: No anorexia, nausea, vomiting or diarrhea. No abdominal pain or blood.  GENITOURINARY: No burning on urination, no polyuria NEUROLOGICAL: No headache, dizziness, syncope, paralysis, ataxia, numbness or tingling in the extremities. No change in bowel or bladder control.  MUSCULOSKELETAL: No muscle, back pain, joint pain or stiffness.  LYMPHATICS: No enlarged nodes. No history of splenectomy.  PSYCHIATRIC: No history of depression or anxiety.  ENDOCRINOLOGIC: No reports of sweating, cold or heat intolerance. No polyuria or polydipsia.  Marland Kitchen   Physical Examination p 80 bp 137/81 Wt 313 lbs BMI 51 Gen: resting comfortably, no acute distress HEENT: no scleral icterus, pupils equal round and reactive, no palptable cervical adenopathy,  CV: RRR, no m/r/g, no JVD, no carotid bruits Resp: Clear to auscultation bilaterally GI: abdomen is soft, non-tender, non-distended, normal bowel sounds, no hepatosplenomegaly MSK: extremities are warm, no edema.  Skin: warm, no rash Neuro:  no focal deficits Psych: appropriate affect   Diagnostic Studies 08/2014 Lexiscan MPI IMPRESSION: 1. Large area of ischemia extending from the basal to distal anteroseptal wall.  2. Normal left ventricular wall motion.  3. Left ventricular ejection fraction 60 for%  4. High-risk stress test findings*.    Assessment and Plan  1. Abnormal stress test/Chest pain - chest pain symptoms most consistent with GI source, better with H2 blocker. No other chest pain symptoms - stress test as part of  bariatric surgery workup with large area of anteroseptal ischemia - will refer for cath to better evaluate coronary anatomy  2. Hyperlipidemia - continue high dose statin  3. HTN - at goal, continue current meds  4. PAD - prior intervention, denies any recent symptoms  5. OSA - continue CPAP  6. Tobacco abuse - advised to quit. She states she has been advised against wellbutin and chantix due to being on seizure meds - advised compliance with nicotine patches  7. Hx of CVA - known cerebral vascular disease, she has an upcoming cerebral angiogram. Followed by neuro     [name], M.D.   09/29/14 Addendum As part of bariatric surgery workup patient referred for stress test that came back abnormal. Follow up cath shows  patent coronaries, stress test is a false positive. From cardiac standpoint no contraindication for the planned surgery.   Zandra Abts MD

## 2014-09-21 NOTE — Patient Instructions (Signed)
Your physician has requested that you have a cardiac catheterization. Cardiac catheterization is used to diagnose and/or treat various heart conditions. Doctors may recommend this procedure for a number of different reasons. The most common reason is to evaluate chest pain. Chest pain can be a symptom of coronary artery disease (CAD), and cardiac catheterization can show whether plaque is narrowing or blocking your heart's arteries. This procedure is also used to evaluate the valves, as well as measure the blood flow and oxygen levels in different parts of your heart. For further information please visit www.cardiosmart.org. Please follow instruction sheet, as given. Continue all current medications. Follow up will be given at time of discharge from procedure above.   

## 2014-09-21 NOTE — Telephone Encounter (Signed)
Left heart cath - Monday, 3/7 - 9:00 - Irish Lack

## 2014-09-22 ENCOUNTER — Other Ambulatory Visit: Payer: Self-pay | Admitting: Cardiology

## 2014-09-22 DIAGNOSIS — R9439 Abnormal result of other cardiovascular function study: Secondary | ICD-10-CM

## 2014-09-23 ENCOUNTER — Telehealth (HOSPITAL_COMMUNITY): Payer: Self-pay | Admitting: Interventional Radiology

## 2014-09-23 ENCOUNTER — Other Ambulatory Visit: Payer: Self-pay | Admitting: Radiology

## 2014-09-23 NOTE — Telephone Encounter (Signed)
Called pt, left VM for her to call me to let me know if she has a true contrast allergy so that we can get her pre-meds called in prior to her procedure scheduled for 09/25/14. JM

## 2014-09-24 ENCOUNTER — Other Ambulatory Visit: Payer: Self-pay | Admitting: Radiology

## 2014-09-25 ENCOUNTER — Other Ambulatory Visit (HOSPITAL_COMMUNITY): Payer: Self-pay | Admitting: Interventional Radiology

## 2014-09-25 ENCOUNTER — Ambulatory Visit (HOSPITAL_COMMUNITY)
Admission: RE | Admit: 2014-09-25 | Discharge: 2014-09-25 | Disposition: A | Discharge status: Home / Self Care | Payer: BLUE CROSS/BLUE SHIELD | Source: Ambulatory Visit | Attending: Interventional Radiology | Admitting: Interventional Radiology

## 2014-09-25 ENCOUNTER — Encounter (HOSPITAL_COMMUNITY): Payer: Self-pay

## 2014-09-25 DIAGNOSIS — E119 Type 2 diabetes mellitus without complications: Secondary | ICD-10-CM | POA: Diagnosis not present

## 2014-09-25 DIAGNOSIS — I771 Stricture of artery: Secondary | ICD-10-CM | POA: Insufficient documentation

## 2014-09-25 DIAGNOSIS — Z8673 Personal history of transient ischemic attack (TIA), and cerebral infarction without residual deficits: Secondary | ICD-10-CM | POA: Insufficient documentation

## 2014-09-25 DIAGNOSIS — I6523 Occlusion and stenosis of bilateral carotid arteries: Secondary | ICD-10-CM | POA: Diagnosis not present

## 2014-09-25 LAB — CBC
HCT: 41.1 % (ref 36.0–46.0)
HEMOGLOBIN: 13.8 g/dL (ref 12.0–15.0)
MCH: 28.6 pg (ref 26.0–34.0)
MCHC: 33.6 g/dL (ref 30.0–36.0)
MCV: 85.3 fL (ref 78.0–100.0)
Platelets: 209 10*3/uL (ref 150–400)
RBC: 4.82 MIL/uL (ref 3.87–5.11)
RDW: 15.1 % (ref 11.5–15.5)
WBC: 7.6 10*3/uL (ref 4.0–10.5)

## 2014-09-25 LAB — BASIC METABOLIC PANEL WITH GFR
Anion gap: 8 (ref 5–15)
BUN: 10 mg/dL (ref 6–23)
CO2: 22 mmol/L (ref 19–32)
Calcium: 9.8 mg/dL (ref 8.4–10.5)
Chloride: 105 mmol/L (ref 96–112)
Creatinine, Ser: 0.54 mg/dL (ref 0.50–1.10)
GFR calc Af Amer: >90 mL/min
GFR calc non Af Amer: >90 mL/min
Glucose, Bld: 251 mg/dL — ABNORMAL HIGH (ref 70–99)
Potassium: 4 mmol/L (ref 3.5–5.1)
Sodium: 135 mmol/L (ref 135–145)

## 2014-09-25 LAB — GLUCOSE, CAPILLARY: Glucose-Capillary: 234 mg/dL — ABNORMAL HIGH (ref 70–99)

## 2014-09-25 LAB — PROTIME-INR
INR: 0.97 (ref 0.00–1.49)
Prothrombin Time: 13 seconds (ref 11.6–15.2)

## 2014-09-25 MED ORDER — MIDAZOLAM HCL 2 MG/2ML IJ SOLN
INTRAMUSCULAR | Status: AC | PRN
  Administered 2014-09-25 (×2): 1 mg via INTRAVENOUS

## 2014-09-25 MED ORDER — LIDOCAINE HCL 1 % IJ SOLN
INTRAMUSCULAR | Status: AC
  Filled 2014-09-25: qty 20

## 2014-09-25 MED ORDER — FENTANYL CITRATE 0.05 MG/ML IJ SOLN
INTRAMUSCULAR | Status: AC
  Filled 2014-09-25: qty 6

## 2014-09-25 MED ORDER — HEPARIN SODIUM (PORCINE) 1000 UNIT/ML IJ SOLN
INTRAMUSCULAR | Status: AC | PRN
  Administered 2014-09-25: 1000 [IU] via INTRAVENOUS

## 2014-09-25 MED ORDER — MIDAZOLAM HCL 2 MG/2ML IJ SOLN
INTRAMUSCULAR | Status: AC
  Filled 2014-09-25: qty 6

## 2014-09-25 MED ORDER — FENTANYL CITRATE 0.05 MG/ML IJ SOLN
INTRAMUSCULAR | Status: AC | PRN
  Administered 2014-09-25 (×3): 25 ug via INTRAVENOUS

## 2014-09-25 MED ORDER — HYDRALAZINE HCL 20 MG/ML IJ SOLN
5.0000 mg | INTRAMUSCULAR | Status: DC
  Administered 2014-09-25: 5 mg via INTRAVENOUS

## 2014-09-25 MED ORDER — HEPARIN SOD (PORK) LOCK FLUSH 100 UNIT/ML IV SOLN
INTRAVENOUS | Status: AC
  Filled 2014-09-25: qty 20

## 2014-09-25 MED ORDER — SODIUM CHLORIDE 0.9 % IV SOLN
INTRAVENOUS | Status: DC
  Administered 2014-09-25: 10:00:00 via INTRAVENOUS

## 2014-09-25 MED ORDER — SODIUM CHLORIDE 0.9 % IV SOLN
INTRAVENOUS | Status: AC
Start: 2014-09-25 — End: 2014-09-25

## 2014-09-25 MED ORDER — HYDRALAZINE HCL 20 MG/ML IJ SOLN
INTRAMUSCULAR | Status: AC
Start: 2014-09-25 — End: 2014-09-25
  Filled 2014-09-25: qty 1

## 2014-09-25 MED ORDER — DIPHENHYDRAMINE HCL 50 MG/ML IJ SOLN
50.0000 mg | Freq: Once | INTRAMUSCULAR | Status: AC
  Administered 2014-09-25: 50 mg via INTRAVENOUS

## 2014-09-25 MED ORDER — IOHEXOL 300 MG/ML  SOLN
150.0000 mL | Freq: Once | INTRAMUSCULAR | Status: AC | PRN
  Administered 2014-09-25: 75 mL via INTRAVENOUS

## 2014-09-25 MED ORDER — DIPHENHYDRAMINE HCL 50 MG/ML IJ SOLN
INTRAMUSCULAR | Status: AC
  Administered 2014-09-25: 50 mg via INTRAVENOUS
  Filled 2014-09-25: qty 1

## 2014-09-25 NOTE — H&P (Signed)
Chief Complaint: History of strokes with abnormal recent carotid US and need to have W/U prior to gastric bypass  Referring Physician(s): Dr. Cathlean Sauer  History of Present Illness: Jill [name] is a 49 y.o. female with history of CVA, recent carotid US on 09/11/14 revealed high resistant waveform of RICA. Her last cerebral arteriogram was on 05/2008, last MRI 08/25/11 with acute infarction in the right corona radiata. She has been scheduled today for cerebral arteriogram. She denies any new neurological changes with extremity weakness, speech or vision. She denies any headaches. She does admit to intermittent LLE weakness which she states is secondary to old stroke in 2013. She denies any chest pain, shortness of breath or palpitations. She denies any active signs of bleeding or excessive bruising. She denies any recent fever or chills. The patient does have OSA and uses a CPAP. She has previously tolerated sedation without complications.     Past Medical History  Diagnosis Date  . Hypertension   . Diabetes mellitus   . Seizures   . 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 Hypercholesterolemia   . Fibroids   . Heart murmur     Past Surgical History  Procedure Laterality Date  . Cholecystectomy    . Cesarean section      Allergies: Contrast media; Crab; and Other  Medications: Prior to Admission medications   Medication Sig Start Date End Date Taking? Authorizing Provider  acetaminophen (TYLENOL) 500 MG tablet Take 1,000 mg by mouth every 8 (eight) hours as needed. For pain   Yes Historical Provider, MD  aspirin 81 MG tablet Take 81 mg by mouth daily.   Yes Historical Provider, MD  clopidogrel (PLAVIX) 75 MG tablet Take 75 mg by mouth every morning.   Yes Historical Provider, MD  docusate sodium (COLACE) 100 MG capsule Take 100 mg by mouth daily.   Yes Historical Provider, MD  felodipine (PLENDIL) 5 MG 24 hr tablet Take 5 mg by mouth daily.   Yes  Historical Provider, MD  furosemide (LASIX) 80 MG tablet Take 40 mg by mouth daily.   Yes Historical Provider, MD  insulin aspart (NOVOLOG) 100 UNIT/ML injection Inject 20 Units into the skin at bedtime.    Yes Historical Provider, MD  insulin detemir (LEVEMIR) 100 UNIT/ML injection Inject 60 Units into the skin at bedtime.   Yes Historical Provider, MD  lamoTRIgine (LAMICTAL) 100 MG tablet Take 100 mg by mouth every morning.    Yes Historical Provider, MD  levETIRAcetam (KEPPRA) 500 MG tablet Take 250 mg by mouth every 12 (twelve) hours.   Yes Historical Provider, MD  metFORMIN (GLUCOPHAGE) 1000 MG tablet Take 1,000 mg by mouth 2 (two) times daily with a meal.   Yes Historical Provider, MD  Multiple Vitamin (MULITIVITAMIN WITH MINERALS) TABS Take 1 tablet by mouth every morning.    Yes Historical Provider, MD  nicotine (NICODERM CQ - DOSED IN MG/24 HOURS) 21 mg/24hr patch Place 21 mg onto the skin daily.   Yes Historical Provider, MD  olmesartan (BENICAR) 40 MG tablet Take 40 mg by mouth every morning.    Yes Historical Provider, MD  pioglitazone (ACTOS) 15 MG tablet Take 15 mg by mouth every morning.    Yes Historical Provider, MD  potassium chloride SA (K-DUR,KLOR-CON) 20 MEQ tablet Take 40 mEq by mouth daily.    Yes Historical Provider, MD  ranitidine (ZANTAC) 150 MG tablet Take 1 tablet (150  mg total) by mouth 2 (two) times daily. 11/24/13  Yes [name], MD  rosuvastatin (CRESTOR) 20 MG tablet Take 20 mg by mouth every morning.    Yes Historical Provider, MD  ibuprofen (ADVIL,MOTRIN) 800 MG tablet Take 1 tablet (800 mg total) by mouth every 6 (six) hours as needed for pain. Patient not taking: Reported on 09/23/2014 12/17/12   Jacqulynn Cadet, MD     History reviewed. No pertinent family history.  History   Social History  . Marital Status: Married    Spouse Name: N/A  . Number of Children: N/A  . Years of Education: N/A   Social History Main Topics  . Smoking status: Current  Some Day Smoker -- 0.25 packs/day for 20 years    Types: Cigarettes    Start date: 07/25/1975  . Smokeless tobacco: Never Used  . Alcohol Use: No  . Drug Use: No  . Sexual Activity: Yes    Birth Control/ Protection: Other-see comments     Comment: Tubal Ligation   Other Topics Concern  . None   Social History Narrative   Review of Systems: A 12 point ROS discussed and pertinent positives are indicated in the HPI above.  All other systems are negative.  Review of Systems  Vital Signs: BP 157/82 mmHg  Pulse 66  Temp(Src) 98.2 F (36.8 C)  Resp 18  Ht 5\' 6"  (1.676 m)  SpO2 98%  LMP 09/14/2014  Physical Exam  Constitutional: She is oriented to person, place, and time. No distress.  HENT:  Head: Normocephalic and atraumatic.  Neck: No tracheal deviation present.  Cardiovascular: Normal rate and regular rhythm.  Exam reveals no friction rub.   No murmur heard. Pulmonary/Chest: Effort normal and breath sounds normal. No respiratory distress. She has no wheezes. She has no rales.  Abdominal: Soft. Bowel sounds are normal. She exhibits no distension. There is no tenderness.  Neurological: She is alert and oriented to person, place, and time.  Speech clear, strength equal upper and lower extremities, smile symmetrical   Skin: She is not diaphoretic.   Mallampati Score:  MD Evaluation Airway: WNL Heart: WNL Abdomen: WNL Chest/ Lungs: WNL ASA  Classification: 3 Mallampati/Airway Score: Two  Imaging: US Carotid Bilateral  09/11/2014   CLINICAL DATA:  49 year old female with a history of hyperlipidemia and history of stroke.  Cardiovascular risk factors include hypertension, prior stroke/ TIA, hyperlipidemia, diabetes, tobacco use, history of prior vascular surgery/ arterial disease.  EXAM: BILATERAL CAROTID DUPLEX ULTRASOUND  TECHNIQUE: Pearline Cables scale imaging, color Doppler and duplex ultrasound were performed of bilateral carotid and vertebral arteries in the neck.  COMPARISON:   No prior duplex  FINDINGS: Criteria: Quantification of carotid stenosis is based on velocity parameters that correlate the residual internal carotid diameter with NASCET-based stenosis levels, using the diameter of the distal internal carotid lumen as the denominator for stenosis measurement.  The following velocity measurements were obtained:  RIGHT  ICA:  Systolic 50 cm/sec, Diastolic 5 cm/sec  CCA:  097 cm/sec  SYSTOLIC ICA/CCA RATIO:  0.4  ECA:  84 cm/sec  LEFT  ICA:  Systolic 79 cm/sec, Diastolic 31 cm/sec  CCA:  353 cm/sec  SYSTOLIC ICA/CCA RATIO:  0.5  ECA:  93 cm/sec  Right Brachial SBP: 111  Left Brachial SBP: 112  RIGHT CAROTID ARTERY: No significant atherosclerotic disease of the right common carotid artery. Intermediate waveform maintained. Mild heterogeneous plaque at the right carotid bifurcation into the ICA.  High resistance waveform of the  right ICA.  RIGHT VERTEBRAL ARTERY: Low resistance antegrade waveform of the right vertebral artery with high diastolic flow.  LEFT CAROTID ARTERY: Minimal atherosclerotic disease of the left common carotid artery without significant calcifications. Intermediate waveform maintained. Low resistance waveform of the left ICA. No significant atherosclerotic calcified plaque at the left bifurcation with minimal heterogeneous plaque present.  LEFT VERTEBRAL ARTERY: Antegrade flow within the left vertebral artery with low resistance waveform.  IMPRESSION: No significant atherosclerotic disease of the left or right carotid systems, with no significant stenosis at the left or right carotid bifurcation by SRU duplex criteria.  High resistant waveform of the right ICA is suggestive of intracranial stenosis or possibly occlusion. A stenosis was previously demonstrated on the angiogram dated 05/27/2008, potentially having progressed. Repeat formal angiogram or CT angiogram may be useful.  High diastolic flow of the right vertebral artery is suggestive of flow compensation for  the probable right intracranial ICA stenosis/occlusion.  Signed,  Dulcy Fanny. Earleen Newport, DO  [facility]  [facility]   Electronically Signed   By: Corrie Mckusick D.O.   On: 09/11/2014 17:11   Nm Myocar Multi W/spect W/wall Motion / Ef  09/18/2014   CLINICAL DATA:  49 year old female with no known history of coronary artery disease referred for chest pain.  EXAM: MYOCARDIAL IMAGING WITH SPECT (REST AND PHARMACOLOGIC-STRESS - 2 DAY PROTOCOL)  GATED LEFT VENTRICULAR WALL MOTION STUDY  LEFT VENTRICULAR EJECTION FRACTION  TECHNIQUE: Standard myocardial SPECT imaging was performed after resting intravenous injection of 10 mCi Tc-68m sestamibi. Subsequently, on a second day, intravenous infusion of Lexiscan was performed under the supervision of the Cardiology staff. At peak effect of the drug, 30 mCi Tc-47m sestamibi was injected intravenously and standard myocardial SPECT imaging was performed. Quantitative gated imaging was also performed to evaluate left ventricular wall motion, and estimate left ventricular ejection fraction.  COMPARISON:  None.  FINDINGS: Pharmacological stress  Baseline EKG showed normal sinus rhythm. After injection heart rate increased from 65 beats per min up to 88 beats per min, and blood pressure increased from 116/71 up to 125/58. The test was stopped after injection was complete, the patient did not experience any chest pain. Post-injection EKG showed no specific ischemic changes and no significant arrhythmias.  Perfusion: There is a large in size moderate intensity partially reversible anteroseptal wall defect.  Wall Motion: Normal left ventricular wall motion. No left ventricular dilation.  Left Ventricular Ejection Fraction: 64 %  End diastolic volume 130 ml  End systolic volume 36 ml  IMPRESSION: 1. Large area of ischemia extending from the basal to distal anteroseptal wall.  2. Normal left ventricular wall motion.  3. Left ventricular ejection  fraction 60 for%  4. High-risk stress test findings*.  *2012 Appropriate Use Criteria for Coronary Revascularization Focused Update: J Am Coll Cardiol. 8657;84(6):962-952. http://content.airportbarriers.com.aspx?articleid=[email or web address]   Electronically Signed   By: Carlyle Dolly   On: 09/18/2014 14:53   Labs:  CBC:  Recent Labs  09/25/14 0949  WBC 7.6  HGB 13.8  HCT 41.1  PLT 209    COAGS:  Recent Labs  09/25/14 0949  INR 0.97    BMP:  Recent Labs  09/25/14 0949  NA 135  K 4.0  CL 105  CO2 22  GLUCOSE 251*  BUN 10  CALCIUM 9.8  CREATININE 0.54  GFRNONAA >90  GFRAA >90    Assessment and Plan: History of CVA Carotid US 09/11/14 with high resistant waveform of RICA Scheduled today  for cerebral arteriogram with moderate sedation Last cerebral arteriogram 05/2008, last MRI 08/25/11 with acute infarction in the right corona radiata  The patient has been NPO, on plavix, labs and vitals have been reviewed. Risks and Benefits discussed with the patient including, but not limited to bleeding, infection, vascular injury or stroke.  All of the patient's questions were answered, patient is agreeable to proceed. Consent signed and in chart. DM HTN HLP History of PAD s/p PTA and stenting of right common iliac and right SFA 07/2008 OSA, uses CPAP. Abnormal cardiac stress test, workup prior to gastric bypass will need heart catheterization 3/7-Dr. Harl Bowie.    Thank you for this interesting consult.  I greatly enjoyed meeting Jill [name] and look forward to participating in their care.  SignedHedy Jacob 09/25/2014, 11:06 AM   I spent a total of 20 Minutes in face to face in clinical consultation, greater than 50% of which was counseling/coordinating care for stenosis and abnormal carotid ultrasound.

## 2014-09-25 NOTE — Discharge Instructions (Signed)
Angiogram, Care After Refer to this sheet in the next few weeks. These instructions provide you with information on caring for yourself after your procedure. Your health care provider may also give you more specific instructions. Your treatment has been planned according to current medical practices, but problems sometimes occur. Call your health care provider if you have any problems or questions after your procedure.  WHAT TO EXPECT AFTER THE PROCEDURE After your procedure, it is typical to have the following sensations:  Minor discomfort or tenderness and a small bump at the catheter insertion site. The bump should usually decrease in size and tenderness within 1 to 2 weeks.  Any bruising will usually fade within 2 to 4 weeks. HOME CARE INSTRUCTIONS   You may need to keep taking blood thinners if they were prescribed for you. Take medicines only as directed by your health care provider.  Do not apply powder or lotion to the site.  Do not take baths, swim, or use a hot tub until your health care provider approves.  You may shower 24 hours after the procedure. Remove the bandage (dressing) and gently wash the site with plain soap and water. Gently pat the site dry.  Inspect the site at least twice daily.  Limit your activity for the first 48 hours. Do not bend, squat, or lift anything over 20 lb (9 kg) or as directed by your health care provider.  Plan to have someone take you home after the procedure. Follow instructions about when you can drive or return to work. SEEK MEDICAL CARE IF:  You get light-headed when standing up.  You have drainage (other than a small amount of blood on the dressing).  You have chills.  You have a fever.  You have redness, warmth, swelling, or pain at the insertion site. SEEK IMMEDIATE MEDICAL CARE IF:   You develop chest pain or shortness of breath, feel faint, or pass out.  You have bleeding, swelling larger than a walnut, or drainage from the  catheter insertion site.  You develop pain, discoloration, coldness, or severe bruising in the leg or arm that held the catheter.  You develop bleeding from any other place, such as the bowels. You may see bright red blood in your urine or stools, or your stools may appear black and tarry.  You have heavy bleeding from the site. If this happens, hold pressure on the site. MAKE SURE YOU:  Understand these instructions.  Will watch your condition.  Will get help right away if you are not doing well or get worse. Document Released: 01/26/2005 Document Revised: 11/24/2013 Document Reviewed: 12/02/2012 [facility] Patient Information 2015 [place], [place]. This information is not intended to replace advice given to you by your health care provider. Make sure you discuss any questions you have with your health care provider. Angiogram, Care After Refer to this sheet in the next few weeks. These instructions provide you with information on caring for yourself after your procedure. Your health care provider may also give you more specific instructions. Your treatment has been planned according to current medical practices, but problems sometimes occur. Call your health care provider if you have any problems or questions after your procedure.  WHAT TO EXPECT AFTER THE PROCEDURE After your procedure, it is typical to have the following sensations:  Minor discomfort or tenderness and a small bump at the catheter insertion site. The bump should usually decrease in size and tenderness within 1 to 2 weeks.  Any bruising will usually  fade within 2 to 4 weeks. HOME CARE INSTRUCTIONS   You may need to keep taking blood thinners if they were prescribed for you. Take medicines only as directed by your health care provider.  Do not apply powder or lotion to the site.  Do not take baths, swim, or use a hot tub until your health care provider approves.  You may shower 24 hours after the procedure. Remove the  bandage (dressing) and gently wash the site with plain soap and water. Gently pat the site dry.  Inspect the site at least twice daily.  Limit your activity for the first 48 hours. Do not bend, squat, or lift anything over 20 lb (9 kg) or as directed by your health care provider.  Plan to have someone take you home after the procedure. Follow instructions about when you can drive or return to work. SEEK MEDICAL CARE IF:  You get light-headed when standing up.  You have drainage (other than a small amount of blood on the dressing).  You have chills.  You have a fever.  You have redness, warmth, swelling, or pain at the insertion site. SEEK IMMEDIATE MEDICAL CARE IF:   You develop chest pain or shortness of breath, feel faint, or pass out.  You have bleeding, swelling larger than a walnut, or drainage from the catheter insertion site.  You develop pain, discoloration, coldness, or severe bruising in the leg or arm that held the catheter.  You develop bleeding from any other place, such as the bowels. You may see bright red blood in your urine or stools, or your stools may appear black and tarry.  You have heavy bleeding from the site. If this happens, hold pressure on the site. MAKE SURE YOU:  Understand these instructions.  Will watch your condition.  Will get help right away if you are not doing well or get worse. Document Released: 01/26/2005 Document Revised: 11/24/2013 Document Reviewed: 12/02/2012 [facility] Patient Information 2015 [place], [place]. This information is not intended to replace advice given to you by your health care provider. Make sure you discuss any questions you have with your health care provider.

## 2014-09-25 NOTE — Procedures (Signed)
S/P 4 vessel cerebral arteriogram. RT CFA approach. Findings. 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

## 2014-09-28 ENCOUNTER — Ambulatory Visit (HOSPITAL_COMMUNITY)
Admission: RE | Admit: 2014-09-28 | Discharge: 2014-09-28 | Disposition: A | Discharge status: Home / Self Care | Payer: BLUE CROSS/BLUE SHIELD | Source: Ambulatory Visit | Attending: Interventional Cardiology | Admitting: Interventional Cardiology

## 2014-09-28 ENCOUNTER — Encounter (HOSPITAL_COMMUNITY): Payer: Self-pay | Admitting: Interventional Cardiology

## 2014-09-28 ENCOUNTER — Encounter (HOSPITAL_COMMUNITY)
Admission: RE | Disposition: A | Discharge status: Home / Self Care | Payer: Self-pay | Source: Ambulatory Visit | Attending: Interventional Cardiology

## 2014-09-28 DIAGNOSIS — R9439 Abnormal result of other cardiovascular function study: Secondary | ICD-10-CM

## 2014-09-28 DIAGNOSIS — E780 Pure hypercholesterolemia: Secondary | ICD-10-CM | POA: Diagnosis not present

## 2014-09-28 DIAGNOSIS — I2510 Atherosclerotic heart disease of native coronary artery without angina pectoris: Secondary | ICD-10-CM | POA: Insufficient documentation

## 2014-09-28 DIAGNOSIS — Z91041 Radiographic dye allergy status: Secondary | ICD-10-CM | POA: Insufficient documentation

## 2014-09-28 DIAGNOSIS — I739 Peripheral vascular disease, unspecified: Secondary | ICD-10-CM | POA: Insufficient documentation

## 2014-09-28 DIAGNOSIS — D259 Leiomyoma of uterus, unspecified: Secondary | ICD-10-CM | POA: Diagnosis not present

## 2014-09-28 DIAGNOSIS — I771 Stricture of artery: Secondary | ICD-10-CM

## 2014-09-28 DIAGNOSIS — Z794 Long term (current) use of insulin: Secondary | ICD-10-CM | POA: Insufficient documentation

## 2014-09-28 DIAGNOSIS — R569 Unspecified convulsions: Secondary | ICD-10-CM | POA: Diagnosis not present

## 2014-09-28 DIAGNOSIS — E785 Hyperlipidemia, unspecified: Secondary | ICD-10-CM | POA: Diagnosis not present

## 2014-09-28 DIAGNOSIS — Z8673 Personal history of transient ischemic attack (TIA), and cerebral infarction without residual deficits: Secondary | ICD-10-CM | POA: Insufficient documentation

## 2014-09-28 DIAGNOSIS — Z7982 Long term (current) use of aspirin: Secondary | ICD-10-CM | POA: Insufficient documentation

## 2014-09-28 DIAGNOSIS — Z91013 Allergy to seafood: Secondary | ICD-10-CM | POA: Diagnosis not present

## 2014-09-28 DIAGNOSIS — I10 Essential (primary) hypertension: Secondary | ICD-10-CM | POA: Insufficient documentation

## 2014-09-28 DIAGNOSIS — G4733 Obstructive sleep apnea (adult) (pediatric): Secondary | ICD-10-CM | POA: Insufficient documentation

## 2014-09-28 DIAGNOSIS — Z9049 Acquired absence of other specified parts of digestive tract: Secondary | ICD-10-CM | POA: Diagnosis not present

## 2014-09-28 DIAGNOSIS — E119 Type 2 diabetes mellitus without complications: Secondary | ICD-10-CM | POA: Diagnosis not present

## 2014-09-28 DIAGNOSIS — F17210 Nicotine dependence, cigarettes, uncomplicated: Secondary | ICD-10-CM | POA: Diagnosis not present

## 2014-09-28 DIAGNOSIS — R079 Chest pain, unspecified: Secondary | ICD-10-CM | POA: Diagnosis present

## 2014-09-28 HISTORY — PX: LEFT HEART CATHETERIZATION WITH CORONARY ANGIOGRAM: SHX5451

## 2014-09-28 LAB — GLUCOSE, CAPILLARY
Glucose-Capillary: 298 mg/dL — ABNORMAL HIGH (ref 70–99)
Glucose-Capillary: 240 mg/dL — ABNORMAL HIGH (ref 70–99)

## 2014-09-28 SURGERY — LEFT HEART CATHETERIZATION WITH CORONARY ANGIOGRAM

## 2014-09-28 MED ORDER — HEPARIN SODIUM (PORCINE) 1000 UNIT/ML IJ SOLN
INTRAMUSCULAR | Status: AC
  Filled 2014-09-28: qty 1

## 2014-09-28 MED ORDER — SODIUM CHLORIDE 0.9 % IV SOLN
INTRAVENOUS | Status: DC
  Administered 2014-09-28: 08:00:00 via INTRAVENOUS

## 2014-09-28 MED ORDER — ASPIRIN 81 MG PO CHEW
81.0000 mg | CHEWABLE_TABLET | ORAL | Status: AC
  Administered 2014-09-28: 81 mg via ORAL

## 2014-09-28 MED ORDER — FAMOTIDINE IN NACL 20-0.9 MG/50ML-% IV SOLN
20.0000 mg | INTRAVENOUS | Status: AC
  Administered 2014-09-28: 20 mg via INTRAVENOUS

## 2014-09-28 MED ORDER — FAMOTIDINE IN NACL 20-0.9 MG/50ML-% IV SOLN
INTRAVENOUS | Status: AC
Start: 2014-09-28 — End: 2014-09-28
  Administered 2014-09-28: 20 mg via INTRAVENOUS
  Filled 2014-09-28: qty 50

## 2014-09-28 MED ORDER — ASPIRIN 81 MG PO CHEW
CHEWABLE_TABLET | ORAL | Status: AC
  Administered 2014-09-28: 81 mg via ORAL
  Filled 2014-09-28: qty 1

## 2014-09-28 MED ORDER — DIPHENHYDRAMINE HCL 50 MG/ML IJ SOLN
25.0000 mg | INTRAMUSCULAR | Status: AC
  Administered 2014-09-28: 25 mg via INTRAVENOUS

## 2014-09-28 MED ORDER — NITROGLYCERIN 1 MG/10 ML FOR IR/CATH LAB
INTRA_ARTERIAL | Status: AC
  Filled 2014-09-28: qty 10

## 2014-09-28 MED ORDER — SODIUM CHLORIDE 0.9 % IJ SOLN: 3.0000 mL | Freq: Two times a day (BID) | INTRAMUSCULAR | Status: DC

## 2014-09-28 MED ORDER — METFORMIN HCL 1000 MG PO TABS: 1000.0000 mg | ORAL_TABLET | Freq: Two times a day (BID) | ORAL | Status: DC

## 2014-09-28 MED ORDER — MIDAZOLAM HCL 2 MG/2ML IJ SOLN
INTRAMUSCULAR | Status: AC
  Filled 2014-09-28: qty 2

## 2014-09-28 MED ORDER — FENTANYL CITRATE 0.05 MG/ML IJ SOLN
INTRAMUSCULAR | Status: AC
  Filled 2014-09-28: qty 2

## 2014-09-28 MED ORDER — SODIUM CHLORIDE 0.9 % IV SOLN: 250.0000 mL | INTRAVENOUS | Status: DC | PRN

## 2014-09-28 MED ORDER — SODIUM CHLORIDE 0.9 % IV SOLN: 1.0000 mL/kg/h | INTRAVENOUS | Status: DC

## 2014-09-28 MED ORDER — SODIUM CHLORIDE 0.9 % IJ SOLN: 3.0000 mL | INTRAMUSCULAR | Status: DC | PRN

## 2014-09-28 MED ORDER — HEPARIN (PORCINE) IN NACL 2-0.9 UNIT/ML-% IJ SOLN
INTRAMUSCULAR | Status: AC
  Filled 2014-09-28: qty 500

## 2014-09-28 MED ORDER — DIPHENHYDRAMINE HCL 50 MG/ML IJ SOLN
INTRAMUSCULAR | Status: AC
  Administered 2014-09-28: 25 mg via INTRAVENOUS
  Filled 2014-09-28: qty 1

## 2014-09-28 MED ORDER — LIDOCAINE HCL (PF) 1 % IJ SOLN
INTRAMUSCULAR | Status: AC
  Filled 2014-09-28: qty 30

## 2014-09-28 MED ORDER — FUROSEMIDE 80 MG PO TABS: 40.0000 mg | ORAL_TABLET | Freq: Every day | ORAL | Status: DC

## 2014-09-28 MED ORDER — ASPIRIN 81 MG PO CHEW
CHEWABLE_TABLET | ORAL | Status: AC
  Filled 2014-09-28: qty 1

## 2014-09-28 MED ORDER — HEPARIN (PORCINE) IN NACL 2-0.9 UNIT/ML-% IJ SOLN
INTRAMUSCULAR | Status: AC
  Filled 2014-09-28: qty 1000

## 2014-09-28 MED ORDER — VERAPAMIL HCL 2.5 MG/ML IV SOLN
INTRAVENOUS | Status: AC
  Filled 2014-09-28: qty 2

## 2014-09-28 NOTE — CV Procedure (Addendum)
       PROCEDURE:  Left heart catheterization with selective coronary angiography.  INDICATIONS:  Abnormal stress test  The risks, benefits, and details of the procedure were explained to the patient.  The patient verbalized understanding and wanted to proceed.  Informed written consent was obtained.  PROCEDURE TECHNIQUE:  After Xylocaine anesthesia a 25F slender sheath was placed in the right radial artery with a single anterior needle wall stick.   IV Heparin was given.  Right coronary angiography was done using a Judkins R4 guide catheter.  Left coronary angiography was done using a Judkins L3.5 guide catheter.  Left heart cath was done using the pigtail catheter.  A TR band was used for hemostasis.   CONTRAST:  Total of 50 cc.  COMPLICATIONS:  None.    HEMODYNAMICS:  Aortic pressure was 142/66; LV pressure was 144/7; LVEDP 18.  There was no gradient between the left ventricle and aorta.    ANGIOGRAPHIC DATA:   The left main coronary artery is widely patent.  The left anterior descending artery is a large vessel which wraps around the apex. There is mild atherosclerosis in the mid vessel. There is a large diagonal vessel which arises proximally. There are several small diagonals which are patent.  The left circumflex artery is a large dominant vessel. There is a ramus vessel which is large and widely patent. There is mild disease in the circumflex system. The first obtuse marginal is small but patent.  There is a small left PDA which is patent.  The right coronary artery is a nondominant vessel. In the mid vessel, there is moderate disease.  LEFT VENTRICULOGRAM:  Left ventricular angiogram was not done due to the question of a dye allergy.  LVEDP was 18 mmHg.  IMPRESSIONS:  1. Normal left main coronary artery. 2. Mild disease in the left anterior descending artery and its branches. 3. Mild disease in the dominant left circumflex artery and its branches. 4. Moderate disease in the  nondominant right coronary artery. 5. Left ventricular systolic function not assessed.  LVEDP 18 mmHg.   6.   False positive nuclear stress test.  RECOMMENDATION:  Continue aggressive preventive therapy given her history of peripheral vascular disease and multiple risk factors for coronary atherosclerosis. She will follow-up with Dr. Harl Bowie.

## 2014-09-28 NOTE — Discharge Instructions (Signed)
°  NO METFORMIN FOR 2 DAYS  Radial Site Care Refer to this sheet in the next few weeks. These instructions provide you with information on caring for yourself after your procedure. Your caregiver may also give you more specific instructions. Your treatment has been planned according to current medical practices, but problems sometimes occur. Call your caregiver if you have any problems or questions after your procedure. HOME CARE INSTRUCTIONS  You may shower the day after the procedure.Remove the bandage (dressing) and gently wash the site with plain soap and water.Gently pat the site dry.  Do not apply powder or lotion to the site.  Do not submerge the affected site in water for 3 to 5 days.  Inspect the site at least twice daily.  Do not flex or bend the affected arm for 24 hours.  No lifting over 5 pounds (2.3 kg) for 5 days after your procedure.  Do not drive home if you are discharged the same day of the procedure. Have someone else drive you.  You may drive 24 hours after the procedure unless otherwise instructed by your caregiver.  Do not operate machinery or power tools for 24 hours.  A responsible adult should be with you for the first 24 hours after you arrive home. What to expect:  Any bruising will usually fade within 1 to 2 weeks.  Blood that collects in the tissue (hematoma) may be painful to the touch. It should usually decrease in size and tenderness within 1 to 2 weeks. SEEK IMMEDIATE MEDICAL CARE IF:  You have unusual pain at the radial site.  You have redness, warmth, swelling, or pain at the radial site.  You have drainage (other than a small amount of blood on the dressing).  You have chills.  You have a fever or persistent symptoms for more than 72 hours.  You have a fever and your symptoms suddenly get worse.  Your arm becomes pale, cool, tingly, or numb.  You have heavy bleeding from the site. Hold pressure on the site. Document Released:  08/12/2010 Document Revised: 10/02/2011 Document Reviewed: 08/12/2010 [facility] Patient Information 2015 [place], [place]. This information is not intended to replace advice given to you by your health care provider. Make sure you discuss any questions you have with your health care provider.

## 2014-09-28 NOTE — Interval H&P Note (Signed)
Cath Lab Visit (complete for each Cath Lab visit)  Clinical Evaluation Leading to the Procedure:   ACS: No.  Non-ACS:    Anginal Classification: CCS II  Anti-ischemic medical therapy: Minimal Therapy (1 class of medications)  Non-Invasive Test Results: High-risk stress test findings: cardiac mortality >3%/year  Prior CABG: No previous CABG   Ischemic Symptoms? CCS II (Slight limitation of ordinary activity) Anti-ischemic Medical Therapy? Minimal Therapy (1 class of medications) Non-invasive Test Results? High-risk stress test findings: cardiac mortality >3%/yr Prior CABG? No Previous CABG   Patient Information:   1-2V CAD, no prox LAD  A (7)  Indication: 18; Score: 7   Patient Information:   CTO of 1 vessel, no other CAD  U (5)  Indication: 28; Score: 5   Patient Information:   1V CAD with prox LAD  A (8)  Indication: 34; Score: 8   Patient Information:   2V-CAD with prox LAD  A (8)  Indication: 40; Score: 8   Patient Information:   3V-CAD without LMCA  A (8)  Indication: 46; Score: 8   Patient Information:   3V-CAD without LMCA With Abnormal LV systolic function  A (9)  Indication: 48; Score: 9   Patient Information:   LMCA-CAD  A (9)  Indication: 49; Score: 9   Patient Information:   2V-CAD with prox LAD PCI  A (7)  Indication: 62; Score: 7   Patient Information:   2V-CAD with prox LAD CABG  A (8)  Indication: 62; Score: 8   Patient Information:   3V-CAD without LMCA With Low CAD burden(i.e., 3 focal stenoses, low SYNTAX score) PCI  A (7)  Indication: 63; Score: 7   Patient Information:   3V-CAD without LMCA With Low CAD burden(i.e., 3 focal stenoses, low SYNTAX score) CABG  A (9)  Indication: 63; Score: 9   Patient Information:   3V-CAD without LMCA E06c - Intermediate-high CAD burden (i.e., multiple diffuse lesions, presence of CTO, or high SYNTAX score) PCI  U (4)  Indication: 64; Score:  4   Patient Information:   3V-CAD without LMCA E06c - Intermediate-high CAD burden (i.e., multiple diffuse lesions, presence of CTO, or high SYNTAX score) CABG  A (9)  Indication: 64; Score: 9   Patient Information:   LMCA-CAD With Isolated LMCA stenosis  PCI  U (6)  Indication: 65; Score: 6   Patient Information:   LMCA-CAD With Isolated LMCA stenosis  CABG  A (9)  Indication: 65; Score: 9   Patient Information:   LMCA-CAD Additional CAD, low CAD burden (i.e., 1- to 2-vessel additional involvement, low SYNTAX score) PCI  U (5)  Indication: 66; Score: 5   Patient Information:   LMCA-CAD Additional CAD, low CAD burden (i.e., 1- to 2-vessel additional involvement, low SYNTAX score) CABG  A (9)  Indication: 66; Score: 9   Patient Information:   LMCA-CAD Additional CAD, intermediate-high CAD burden (i.e., 3-vessel involvement, presence of CTO, or high SYNTAX score) PCI  I (3)  Indication: 67; Score: 3   Patient Information:   LMCA-CAD Additional CAD, intermediate-high CAD burden (i.e., 3-vessel involvement, presence of CTO, or high SYNTAX score) CABG  A (9)  Indication: 67; Score: 9    History and Physical Interval Note:  09/28/2014 9:11 AM  Jill [name] Signs  has presented today for surgery, with the diagnosis of abnormal stress  The various methods of treatment have been discussed with the patient and family. After consideration of risks, benefits and other options for  treatment, the patient has consented to  Procedure(s): LEFT HEART CATHETERIZATION WITH CORONARY ANGIOGRAM (N/A) as a surgical intervention .  The patient's history has been reviewed, patient examined, no change in status, stable for surgery.  I have reviewed the patient's chart and labs.  Questions were answered to the patient's satisfaction.     Jill Perl S.

## 2014-09-28 NOTE — H&P (View-Only) (Signed)
Clinical Summary Jill [name] is a 49 y.o.female  Seen today for follow up of the following medical problems.  1. Abnormal stress test/Chest pain - last visit described burning feeling midchest, 7/10. No other symptoms. Nothing makes worst. Lasts until she takes alka-seltzer and helps instantly. Occurs 4 times a week. Often after meals, spicy foods.  - symptoms were thought to be GI related and were followed clinically - she is being worked up for bariatric surgery at [facility], as part of preop eval was sent for nuclear stress test.  - Lexiscan MPI which came back high risk with large area of ischemia in the anteroseptal wall. - despite her age strong history of vascular disease including cerebral vascular disease and PAD.  - denies any recentchest pain, seems to have improved since starting zantac. No SOB or DOE. Can walk up flight of stairs without stopping. Building up endurance on treadmill, up to 8 minutes.    2. Hyperlipidemia  - followed by PCP, on crestor 20mg  daily.    3. HTN  - compliant with meds   4. PAD - prior right lower intervention with PTA and stenting of right common iliac and right SFA in Jan 2010 - denies any claudication  5. OSA - compliant with CPAP  6. Tobacco - continues to smoke      Past Medical History  Diagnosis Date  . Hypertension   . Diabetes mellitus   . Seizures   . 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 Hypercholesterolemia   . Fibroids   . Heart murmur      Allergies  Allergen Reactions  . Crab [Shellfish Allergy] Swelling     Current Outpatient Prescriptions  Medication Sig Dispense Refill  . acetaminophen (TYLENOL) 500 MG tablet Take 1,000 mg by mouth every 6 (six) hours as needed. For pain    . aspirin 81 MG tablet Take 81 mg by mouth daily.    . clopidogrel (PLAVIX) 75 MG tablet Take 75 mg by mouth every morning.    . docusate sodium (COLACE) 100 MG capsule Take 100 mg by mouth daily.      . felodipine (PLENDIL) 5 MG 24 hr tablet Take 5 mg by mouth daily.    . furosemide (LASIX) 80 MG tablet Take 40 mg by mouth daily.    Marland Kitchen ibuprofen (ADVIL,MOTRIN) 800 MG tablet Take 1 tablet (800 mg total) by mouth every 6 (six) hours as needed for pain. 20 tablet 0  . insulin aspart (NOVOLOG) 100 UNIT/ML injection Inject 10-15 Units into the skin at bedtime.     . insulin detemir (LEVEMIR) 100 UNIT/ML injection Inject 60 Units into the skin at bedtime.    . lamoTRIgine (LAMICTAL) 100 MG tablet Take 100 mg by mouth every morning.     . levETIRAcetam (KEPPRA) 500 MG tablet Take 250 mg by mouth every 12 (twelve) hours.    . metFORMIN (GLUCOPHAGE) 1000 MG tablet Take 1,000 mg by mouth 2 (two) times daily with a meal.    . Multiple Vitamin (MULITIVITAMIN WITH MINERALS) TABS Take 1 tablet by mouth every morning.     . nicotine (NICODERM CQ - DOSED IN MG/24 HOURS) 21 mg/24hr patch Place 1 patch onto the skin daily as needed.    Marland Kitchen olmesartan (BENICAR) 40 MG tablet Take 40 mg by mouth every morning.     . pioglitazone (ACTOS) 15 MG tablet Take 15 mg by mouth every morning.     Marland Kitchen  potassium chloride SA (K-DUR,KLOR-CON) 20 MEQ tablet Take 40 mEq by mouth daily.     . ranitidine (ZANTAC) 150 MG tablet Take 1 tablet (150 mg total) by mouth 2 (two) times daily.    . rosuvastatin (CRESTOR) 20 MG tablet Take 20 mg by mouth every morning.     . Sodium Bicarbonate-Citric Acid (ALKA-SELTZER HEARTBURN PO) Take 1 tablet by mouth as needed (2-3 x per day).     No current facility-administered medications for this visit.     Past Surgical History  Procedure Laterality Date  . Cholecystectomy    . Cesarean section       Allergies  Allergen Reactions  . Crab [Shellfish Allergy] Swelling      No family history on file.   Social History Ms. Roskos reports that she has been smoking Cigarettes.  She started smoking about 39 years ago. She has a 5 pack-year smoking history. She has never used smokeless  tobacco. Ms. Epler reports that she does not drink alcohol.   Review of Systems CONSTITUTIONAL: No weight loss, fever, chills, weakness or fatigue.  HEENT: Eyes: No visual loss, blurred vision, double vision or yellow sclerae.No hearing loss, sneezing, congestion, runny nose or sore throat.  SKIN: No rash or itching.  CARDIOVASCULAR: per HPI RESPIRATORY: No shortness of breath, cough or sputum.  GASTROINTESTINAL: No anorexia, nausea, vomiting or diarrhea. No abdominal pain or blood.  GENITOURINARY: No burning on urination, no polyuria NEUROLOGICAL: No headache, dizziness, syncope, paralysis, ataxia, numbness or tingling in the extremities. No change in bowel or bladder control.  MUSCULOSKELETAL: No muscle, back pain, joint pain or stiffness.  LYMPHATICS: No enlarged nodes. No history of splenectomy.  PSYCHIATRIC: No history of depression or anxiety.  ENDOCRINOLOGIC: No reports of sweating, cold or heat intolerance. No polyuria or polydipsia.  Marland Kitchen   Physical Examination p 80 bp 137/81 Wt 313 lbs BMI 51 Gen: resting comfortably, no acute distress HEENT: no scleral icterus, pupils equal round and reactive, no palptable cervical adenopathy,  CV: RRR, no m/r/g, no JVD, no carotid bruits Resp: Clear to auscultation bilaterally GI: abdomen is soft, non-tender, non-distended, normal bowel sounds, no hepatosplenomegaly MSK: extremities are warm, no edema.  Skin: warm, no rash Neuro:  no focal deficits Psych: appropriate affect   Diagnostic Studies 08/2014 Lexiscan MPI IMPRESSION: 1. Large area of ischemia extending from the basal to distal anteroseptal wall.  2. Normal left ventricular wall motion.  3. Left ventricular ejection fraction 60 for%  4. High-risk stress test findings*.    Assessment and Plan  1. Abnormal stress test/Chest pain - chest pain symptoms most consistent with GI source, better with H2 blocker. No other chest pain symptoms - stress test as part of  bariatric surgery workup with large area of anteroseptal ischemia - will refer for cath to better evaluate coronary anatomy  2. Hyperlipidemia - continue high dose statin  3. HTN - at goal, continue current meds  4. PAD - prior intervention, denies any recent symptoms  5. OSA - continue CPAP  6. Tobacco abuse - advised to quit. She states she has been advised against wellbutin and chantix due to being on seizure meds - advised compliance with nicotine patches  7. Hx of CVA - known cerebral vascular disease, she has an upcoming cerebral angiogram. Followed by neuro       [name], M.D.

## 2014-09-29 ENCOUNTER — Telehealth: Payer: Self-pay | Admitting: *Deleted

## 2014-09-29 ENCOUNTER — Other Ambulatory Visit (HOSPITAL_COMMUNITY): Payer: Self-pay | Admitting: Interventional Radiology

## 2014-09-29 DIAGNOSIS — I771 Stricture of artery: Secondary | ICD-10-CM

## 2014-09-29 NOTE — Telephone Encounter (Signed)
Pt made aware, forwarded LO note to Dr. Florene Glen

## 2014-09-29 NOTE — Telephone Encounter (Signed)
-----   Message from [name], MD sent at 09/29/2014  4:26 PM EST ----- Please let patient know that I reviewed her heart cath results and they look good. From our standpoint there is no limitation on having her bariatric surgery. Please send my addended last clinic note to her surgeon  Zandra Abts MD

## 2014-09-30 ENCOUNTER — Other Ambulatory Visit (HOSPITAL_COMMUNITY): Payer: Self-pay | Admitting: Interventional Radiology

## 2014-09-30 ENCOUNTER — Ambulatory Visit (HOSPITAL_COMMUNITY)
Admission: RE | Admit: 2014-09-30 | Discharge: 2014-09-30 | Disposition: A | Discharge status: Home / Self Care | Payer: BLUE CROSS/BLUE SHIELD | Source: Ambulatory Visit | Attending: Interventional Radiology | Admitting: Interventional Radiology

## 2014-09-30 DIAGNOSIS — I771 Stricture of artery: Secondary | ICD-10-CM

## 2014-10-05 DIAGNOSIS — E1159 Type 2 diabetes mellitus with other circulatory complications: Secondary | ICD-10-CM | POA: Diagnosis not present

## 2014-10-05 DIAGNOSIS — E785 Hyperlipidemia, unspecified: Secondary | ICD-10-CM | POA: Diagnosis not present

## 2014-10-05 DIAGNOSIS — K219 Gastro-esophageal reflux disease without esophagitis: Secondary | ICD-10-CM | POA: Diagnosis not present

## 2014-10-05 DIAGNOSIS — I679 Cerebrovascular disease, unspecified: Secondary | ICD-10-CM | POA: Diagnosis not present

## 2014-10-05 DIAGNOSIS — Z6841 Body Mass Index (BMI) 40.0 and over, adult: Secondary | ICD-10-CM | POA: Diagnosis not present

## 2014-10-05 DIAGNOSIS — Z720 Tobacco use: Secondary | ICD-10-CM | POA: Diagnosis not present

## 2014-10-05 DIAGNOSIS — I10 Essential (primary) hypertension: Secondary | ICD-10-CM | POA: Diagnosis not present

## 2014-10-05 DIAGNOSIS — Z8673 Personal history of transient ischemic attack (TIA), and cerebral infarction without residual deficits: Secondary | ICD-10-CM | POA: Diagnosis not present

## 2014-10-08 ENCOUNTER — Other Ambulatory Visit: Payer: Self-pay | Admitting: Radiology

## 2014-10-09 ENCOUNTER — Encounter (HOSPITAL_COMMUNITY): Payer: Self-pay

## 2014-10-09 ENCOUNTER — Encounter (HOSPITAL_COMMUNITY)
Admission: RE | Admit: 2014-10-09 | Discharge: 2014-10-09 | Disposition: A | Discharge status: Home / Self Care | Payer: BLUE CROSS/BLUE SHIELD | Source: Ambulatory Visit | Attending: Interventional Radiology | Admitting: Interventional Radiology

## 2014-10-09 HISTORY — DX: Dorsalgia, unspecified: M54.9

## 2014-10-09 HISTORY — DX: Anxiety disorder, unspecified: F41.9

## 2014-10-09 HISTORY — DX: Chronic kidney disease, unspecified: N18.9

## 2014-10-09 HISTORY — DX: Gastro-esophageal reflux disease without esophagitis: K21.9

## 2014-10-09 HISTORY — DX: Personal history of other medical treatment: Z92.89

## 2014-10-09 HISTORY — DX: Sleep apnea, unspecified: G47.30

## 2014-10-09 LAB — CBC WITH DIFFERENTIAL/PLATELET
BASOS PCT: 1 % (ref 0–1)
Basophils Absolute: 0.1 10*3/uL (ref 0.0–0.1)
Eosinophils Absolute: 0.4 10*3/uL (ref 0.0–0.7)
Eosinophils Relative: 5 % (ref 0–5)
HCT: 40.9 % (ref 36.0–46.0)
Hemoglobin: 13.7 g/dL (ref 12.0–15.0)
Lymphocytes Relative: 23 % (ref 12–46)
Lymphs Abs: 2.2 10*3/uL (ref 0.7–4.0)
MCH: 28.5 pg (ref 26.0–34.0)
MCHC: 33.5 g/dL (ref 30.0–36.0)
MCV: 85 fL (ref 78.0–100.0)
MONO ABS: 0.8 10*3/uL (ref 0.1–1.0)
Monocytes Relative: 8 % (ref 3–12)
NEUTROS PCT: 63 % (ref 43–77)
Neutro Abs: 6.1 10*3/uL (ref 1.7–7.7)
Platelets: 263 10*3/uL (ref 150–400)
RBC: 4.81 MIL/uL (ref 3.87–5.11)
RDW: 14.3 % (ref 11.5–15.5)
WBC: 9.6 10*3/uL (ref 4.0–10.5)

## 2014-10-09 LAB — PLATELET INHIBITION P2Y12: Platelet Function  P2Y12: 171 [PRU] — ABNORMAL LOW (ref 194–418)

## 2014-10-09 LAB — HCG, SERUM, QUALITATIVE: PREG SERUM: NEGATIVE

## 2014-10-09 LAB — COMPREHENSIVE METABOLIC PANEL
ALBUMIN: 3.9 g/dL (ref 3.5–5.2)
ALT: 23 U/L (ref 0–35)
AST: 23 U/L (ref 0–37)
Alkaline Phosphatase: 73 U/L (ref 39–117)
Anion gap: 12 (ref 5–15)
BUN: 10 mg/dL (ref 6–23)
CALCIUM: 10.1 mg/dL (ref 8.4–10.5)
CO2: 24 mmol/L (ref 19–32)
CREATININE: 0.6 mg/dL (ref 0.50–1.10)
Chloride: 103 mmol/L (ref 96–112)
GFR calc Af Amer: >90 mL/min (ref >90)
Glucose, Bld: 230 mg/dL — ABNORMAL HIGH (ref 70–99)
Potassium: 4 mmol/L (ref 3.5–5.1)
SODIUM: 139 mmol/L (ref 135–145)
Total Bilirubin: 0.8 mg/dL (ref 0.3–1.2)
Total Protein: 7.4 g/dL (ref 6.0–8.3)

## 2014-10-09 LAB — PROTIME-INR
INR: 0.98 (ref 0.00–1.49)
Prothrombin Time: 13.1 seconds (ref 11.6–15.2)

## 2014-10-09 LAB — APTT: aPTT: 31 seconds (ref 24–37)

## 2014-10-09 NOTE — Pre-Procedure Instructions (Signed)
Jill [name]  10/09/2014   Your procedure is scheduled on:  10/14/2014  Report to [facility] Admitting at 6:30 AM.  Call this number if you have problems the morning of surgery: [phone]   Remember:   Do not eat food or drink liquids after midnight.  On Tuesday NIGHT   Take these medicines the morning of surgery with A SIP OF WATER: Felodipine, aspirin, plavix, lamictal, keppra, ativan, zantac      Do not wear jewelry, make-up or nail polish.   Do not wear lotions, powders, or perfumes. You may wear deodorant.   Do not shave 48 hours prior to surgery.    Do not bring valuables to the hospital.  [facility] is not responsible                  for any belongings or valuables.               Contacts, dentures or bridgework may not be worn into surgery.   Leave suitcase in the car. After surgery it may be brought to your room.   For patients admitted to the hospital, discharge time is determined by your                treatment team.                Patients discharged the day of surgery will not be allowed to drive  home.  Name and phone number of your driver: with family  Special Instructions: Special Instructions: [name] - Preparing for Surgery  Before surgery, you can play an important role.  Because skin is not sterile, your skin needs to be as free of germs as possible.  You can reduce the number of germs on you skin by washing with CHG (chlorahexidine gluconate) soap before surgery.  CHG is an antiseptic cleaner which kills germs and bonds with the skin to continue killing germs even after washing.  Please DO NOT use if you have an allergy to CHG or antibacterial soaps.  If your skin becomes reddened/irritated stop using the CHG and inform your nurse when you arrive at Short Stay.  Do not shave (including legs and underarms) for at least 48 hours prior to the first CHG shower.  You may shave your face.  Please follow these instructions carefully:   1.   Shower with CHG Soap the night before surgery and the  morning of Surgery.  2.  If you choose to wash your hair, wash your hair first as usual with your  normal shampoo.  3.  After you shampoo, rinse your hair and body thoroughly to remove the  Shampoo.  4.  Use CHG as you would any other liquid soap.  You can apply chg directly to the skin and wash gently with scrungie or a clean washcloth.  5.  Apply the CHG Soap to your body ONLY FROM THE NECK DOWN.    Do not use on open wounds or open sores.  Avoid contact with your eyes, ears, mouth and genitals (private parts).  Wash genitals (private parts)   with your normal soap.  6.  Wash thoroughly, paying special attention to the area where your surgery will be performed.  7.  Thoroughly rinse your body with warm water from the neck down.  8.  DO NOT shower/wash with your normal soap after using and rinsing off   the CHG Soap.  9.  Pat yourself dry with  a clean towel.            10.  Wear clean pajamas.            11.  Place clean sheets on your bed the night of your first shower and do not sleep with pets.  Day of Surgery  Do not apply any lotions/deodorants the morning of surgery.  Please wear clean clothes to the hospital/surgery center.   Please read over the following fact sheets that you were given: Pain Booklet, Coughing and Deep Breathing and Surgical Site Infection Prevention

## 2014-10-12 ENCOUNTER — Other Ambulatory Visit: Payer: Self-pay | Admitting: Radiology

## 2014-10-14 ENCOUNTER — Inpatient Hospital Stay (HOSPITAL_COMMUNITY)
Admission: RE | Admit: 2014-10-14 | Discharge: 2014-10-15 | DRG: 038 | Disposition: A | Discharge status: Home / Self Care | Payer: BLUE CROSS/BLUE SHIELD | Source: Ambulatory Visit | Attending: Interventional Radiology | Admitting: Interventional Radiology

## 2014-10-14 ENCOUNTER — Encounter (HOSPITAL_COMMUNITY)
Admission: RE | Disposition: A | Discharge status: Home / Self Care | Payer: Self-pay | Source: Ambulatory Visit | Attending: Interventional Radiology

## 2014-10-14 ENCOUNTER — Ambulatory Visit (HOSPITAL_COMMUNITY): Payer: BLUE CROSS/BLUE SHIELD | Admitting: Anesthesiology

## 2014-10-14 ENCOUNTER — Ambulatory Visit (HOSPITAL_COMMUNITY)
Admission: RE | Admit: 2014-10-14 | Discharge: 2014-10-14 | Disposition: A | Discharge status: Home / Self Care | Payer: BLUE CROSS/BLUE SHIELD | Source: Ambulatory Visit | Attending: Interventional Radiology | Admitting: Interventional Radiology

## 2014-10-14 ENCOUNTER — Encounter (HOSPITAL_COMMUNITY): Payer: Self-pay | Admitting: *Deleted

## 2014-10-14 DIAGNOSIS — I6523 Occlusion and stenosis of bilateral carotid arteries: Secondary | ICD-10-CM | POA: Insufficient documentation

## 2014-10-14 DIAGNOSIS — Z8673 Personal history of transient ischemic attack (TIA), and cerebral infarction without residual deficits: Secondary | ICD-10-CM

## 2014-10-14 DIAGNOSIS — Z79899 Other long term (current) drug therapy: Secondary | ICD-10-CM

## 2014-10-14 DIAGNOSIS — I6522 Occlusion and stenosis of left carotid artery: Secondary | ICD-10-CM | POA: Diagnosis not present

## 2014-10-14 DIAGNOSIS — Z91041 Radiographic dye allergy status: Secondary | ICD-10-CM | POA: Diagnosis not present

## 2014-10-14 DIAGNOSIS — G4730 Sleep apnea, unspecified: Secondary | ICD-10-CM | POA: Diagnosis present

## 2014-10-14 DIAGNOSIS — R011 Cardiac murmur, unspecified: Secondary | ICD-10-CM | POA: Insufficient documentation

## 2014-10-14 DIAGNOSIS — I129 Hypertensive chronic kidney disease with stage 1 through stage 4 chronic kidney disease, or unspecified chronic kidney disease: Secondary | ICD-10-CM | POA: Insufficient documentation

## 2014-10-14 DIAGNOSIS — F17210 Nicotine dependence, cigarettes, uncomplicated: Secondary | ICD-10-CM | POA: Insufficient documentation

## 2014-10-14 DIAGNOSIS — Z9049 Acquired absence of other specified parts of digestive tract: Secondary | ICD-10-CM | POA: Insufficient documentation

## 2014-10-14 DIAGNOSIS — K219 Gastro-esophageal reflux disease without esophagitis: Secondary | ICD-10-CM | POA: Diagnosis present

## 2014-10-14 DIAGNOSIS — Z7902 Long term (current) use of antithrombotics/antiplatelets: Secondary | ICD-10-CM

## 2014-10-14 DIAGNOSIS — I771 Stricture of artery: Secondary | ICD-10-CM

## 2014-10-14 DIAGNOSIS — Z6841 Body Mass Index (BMI) 40.0 and over, adult: Secondary | ICD-10-CM

## 2014-10-14 DIAGNOSIS — Z7982 Long term (current) use of aspirin: Secondary | ICD-10-CM

## 2014-10-14 DIAGNOSIS — F419 Anxiety disorder, unspecified: Secondary | ICD-10-CM | POA: Insufficient documentation

## 2014-10-14 DIAGNOSIS — E780 Pure hypercholesterolemia: Secondary | ICD-10-CM | POA: Insufficient documentation

## 2014-10-14 DIAGNOSIS — N189 Chronic kidney disease, unspecified: Secondary | ICD-10-CM | POA: Insufficient documentation

## 2014-10-14 DIAGNOSIS — Z794 Long term (current) use of insulin: Secondary | ICD-10-CM | POA: Insufficient documentation

## 2014-10-14 DIAGNOSIS — I6529 Occlusion and stenosis of unspecified carotid artery: Secondary | ICD-10-CM | POA: Diagnosis present

## 2014-10-14 DIAGNOSIS — I10 Essential (primary) hypertension: Secondary | ICD-10-CM | POA: Diagnosis present

## 2014-10-14 DIAGNOSIS — F17200 Nicotine dependence, unspecified, uncomplicated: Secondary | ICD-10-CM | POA: Diagnosis present

## 2014-10-14 DIAGNOSIS — E119 Type 2 diabetes mellitus without complications: Secondary | ICD-10-CM | POA: Insufficient documentation

## 2014-10-14 DIAGNOSIS — G4733 Obstructive sleep apnea (adult) (pediatric): Secondary | ICD-10-CM | POA: Insufficient documentation

## 2014-10-14 DIAGNOSIS — M549 Dorsalgia, unspecified: Secondary | ICD-10-CM | POA: Diagnosis not present

## 2014-10-14 DIAGNOSIS — I639 Cerebral infarction, unspecified: Secondary | ICD-10-CM

## 2014-10-14 DIAGNOSIS — E785 Hyperlipidemia, unspecified: Secondary | ICD-10-CM | POA: Diagnosis present

## 2014-10-14 DIAGNOSIS — Q2112 Patent foramen ovale: Secondary | ICD-10-CM

## 2014-10-14 DIAGNOSIS — Q211 Atrial septal defect: Secondary | ICD-10-CM | POA: Diagnosis not present

## 2014-10-14 DIAGNOSIS — R9439 Abnormal result of other cardiovascular function study: Secondary | ICD-10-CM

## 2014-10-14 HISTORY — PX: RADIOLOGY WITH ANESTHESIA: SHX6223

## 2014-10-14 LAB — POCT I-STAT GLUCOSE
GLUCOSE: 203 mg/dL — AB (ref 70–99)
Glucose, Bld: 177 mg/dL — ABNORMAL HIGH (ref 70–99)
OPERATOR ID: 190282
Operator id: 190282

## 2014-10-14 LAB — GLUCOSE, CAPILLARY
Glucose-Capillary: 141 mg/dL — ABNORMAL HIGH (ref 70–99)
Glucose-Capillary: 212 mg/dL — ABNORMAL HIGH (ref 70–99)

## 2014-10-14 LAB — POCT ACTIVATED CLOTTING TIME
Activated Clotting Time: 171 seconds
Activated Clotting Time: 177 seconds
Activated Clotting Time: 171 seconds

## 2014-10-14 LAB — HEPARIN LEVEL (UNFRACTIONATED): HEPARIN UNFRACTIONATED: 0.17 [IU]/mL — AB (ref 0.30–0.70)

## 2014-10-14 SURGERY — RADIOLOGY WITH ANESTHESIA
Anesthesia: General

## 2014-10-14 MED ORDER — CLOPIDOGREL BISULFATE 75 MG PO TABS: 75.0000 mg | ORAL_TABLET | Freq: Once | ORAL | Status: DC

## 2014-10-14 MED ORDER — ASPIRIN 81 MG PO CHEW
243.0000 mg | CHEWABLE_TABLET | Freq: Once | ORAL | Status: AC
  Administered 2014-10-14: 243 mg via ORAL

## 2014-10-14 MED ORDER — ASPIRIN 81 MG PO CHEW
CHEWABLE_TABLET | ORAL | Status: AC
  Filled 2014-10-14: qty 3

## 2014-10-14 MED ORDER — INSULIN DETEMIR 100 UNIT/ML ~~LOC~~ SOLN
60.0000 [IU] | Freq: Every day | SUBCUTANEOUS | Status: DC
  Administered 2014-10-14: 60 [IU] via SUBCUTANEOUS
  Filled 2014-10-14 (×2): qty 0.6

## 2014-10-14 MED ORDER — PHENYLEPHRINE HCL 10 MG/ML IJ SOLN
20.0000 mg | INTRAMUSCULAR | Status: DC | PRN
  Administered 2014-10-14: 10 ug/min via INTRAVENOUS

## 2014-10-14 MED ORDER — PROPOFOL 10 MG/ML IV BOLUS
INTRAVENOUS | Status: DC | PRN
  Administered 2014-10-14: 150 mg via INTRAVENOUS

## 2014-10-14 MED ORDER — ASPIRIN 325 MG PO TABS
325.0000 mg | ORAL_TABLET | Freq: Every day | ORAL | Status: DC
  Administered 2014-10-15: 325 mg via ORAL
  Filled 2014-10-14 (×2): qty 1

## 2014-10-14 MED ORDER — ARTIFICIAL TEARS OP OINT
TOPICAL_OINTMENT | OPHTHALMIC | Status: DC | PRN
Start: 2014-10-14 — End: 2014-10-14
  Administered 2014-10-14: 1 via OPHTHALMIC

## 2014-10-14 MED ORDER — EPHEDRINE SULFATE 50 MG/ML IJ SOLN
INTRAMUSCULAR | Status: DC | PRN
  Administered 2014-10-14: 10 mg via INTRAVENOUS

## 2014-10-14 MED ORDER — FENTANYL CITRATE 0.05 MG/ML IJ SOLN: 25.0000 ug | INTRAMUSCULAR | Status: DC | PRN

## 2014-10-14 MED ORDER — FAMOTIDINE 20 MG PO TABS
20.0000 mg | ORAL_TABLET | Freq: Two times a day (BID) | ORAL | Status: DC
  Administered 2014-10-14 – 2014-10-15 (×2): 20 mg via ORAL
  Filled 2014-10-14 (×3): qty 1

## 2014-10-14 MED ORDER — NIMODIPINE 30 MG PO CAPS
0.0000 mg | ORAL_CAPSULE | ORAL | Status: AC
  Administered 2014-10-14: 30 mg via ORAL
  Filled 2014-10-14: qty 1

## 2014-10-14 MED ORDER — IRBESARTAN 300 MG PO TABS
300.0000 mg | ORAL_TABLET | Freq: Every day | ORAL | Status: DC
  Administered 2014-10-14 – 2014-10-15 (×2): 300 mg via ORAL
  Filled 2014-10-14 (×2): qty 1

## 2014-10-14 MED ORDER — LEVETIRACETAM 250 MG PO TABS
250.0000 mg | ORAL_TABLET | Freq: Two times a day (BID) | ORAL | Status: DC
  Administered 2014-10-14 – 2014-10-15 (×2): 250 mg via ORAL
  Filled 2014-10-14 (×3): qty 1

## 2014-10-14 MED ORDER — ACETAMINOPHEN 650 MG RE SUPP: 650.0000 mg | Freq: Four times a day (QID) | RECTAL | Status: DC | PRN

## 2014-10-14 MED ORDER — KETOROLAC TROMETHAMINE 30 MG/ML IJ SOLN
30.0000 mg | Freq: Once | INTRAMUSCULAR | Status: AC
  Administered 2014-10-14: 30 mg via INTRAVENOUS
  Filled 2014-10-14: qty 1

## 2014-10-14 MED ORDER — POTASSIUM CHLORIDE CRYS ER 20 MEQ PO TBCR
40.0000 meq | EXTENDED_RELEASE_TABLET | Freq: Every day | ORAL | Status: DC
  Administered 2014-10-14 – 2014-10-15 (×2): 40 meq via ORAL
  Filled 2014-10-14 (×2): qty 2

## 2014-10-14 MED ORDER — NICARDIPINE HCL IN NACL 20-0.86 MG/200ML-% IV SOLN: 5.0000 mg/h | INTRAVENOUS | Status: DC

## 2014-10-14 MED ORDER — SUCCINYLCHOLINE CHLORIDE 20 MG/ML IJ SOLN
INTRAMUSCULAR | Status: DC | PRN
  Administered 2014-10-14: 120 mg via INTRAVENOUS

## 2014-10-14 MED ORDER — PIOGLITAZONE HCL 15 MG PO TABS
15.0000 mg | ORAL_TABLET | Freq: Every morning | ORAL | Status: DC
  Administered 2014-10-15: 15 mg via ORAL
  Filled 2014-10-14: qty 1

## 2014-10-14 MED ORDER — HEPARIN (PORCINE) IN NACL 100-0.45 UNIT/ML-% IJ SOLN
900.0000 [IU]/h | INTRAMUSCULAR | Status: AC
  Filled 2014-10-14: qty 250

## 2014-10-14 MED ORDER — ASPIRIN EC 325 MG PO TBEC: 325.0000 mg | DELAYED_RELEASE_TABLET | ORAL | Status: DC

## 2014-10-14 MED ORDER — DEXTROSE 5 % IV SOLN
3.0000 g | Freq: Once | INTRAVENOUS | Status: AC
  Administered 2014-10-14: 3 g via INTRAVENOUS
  Filled 2014-10-14: qty 3000

## 2014-10-14 MED ORDER — GLYCOPYRROLATE 0.2 MG/ML IJ SOLN
INTRAMUSCULAR | Status: DC | PRN
  Administered 2014-10-14: 0.2 mg via INTRAVENOUS
  Administered 2014-10-14: 0.1 mg via INTRAVENOUS

## 2014-10-14 MED ORDER — LIDOCAINE HCL (CARDIAC) 20 MG/ML IV SOLN
INTRAVENOUS | Status: DC | PRN
  Administered 2014-10-14: 50 mg via INTRAVENOUS

## 2014-10-14 MED ORDER — HEPARIN (PORCINE) IN NACL 100-0.45 UNIT/ML-% IJ SOLN
INTRAMUSCULAR | Status: AC
Start: 2014-10-14 — End: 2014-10-14
  Administered 2014-10-14: 500 [IU]/h via INTRAVENOUS
  Filled 2014-10-14: qty 250

## 2014-10-14 MED ORDER — INSULIN ASPART 100 UNIT/ML ~~LOC~~ SOLN
20.0000 [IU] | Freq: Every day | SUBCUTANEOUS | Status: DC
Start: 2014-10-14 — End: 2014-10-15

## 2014-10-14 MED ORDER — ONDANSETRON HCL 4 MG/2ML IJ SOLN
INTRAMUSCULAR | Status: DC | PRN
Start: 2014-10-14 — End: 2014-10-14
  Administered 2014-10-14: 4 mg via INTRAVENOUS

## 2014-10-14 MED ORDER — ADULT MULTIVITAMIN W/MINERALS CH
1.0000 | ORAL_TABLET | Freq: Every morning | ORAL | Status: DC
  Administered 2014-10-14 – 2014-10-15 (×2): 1 via ORAL
  Filled 2014-10-14 (×2): qty 1

## 2014-10-14 MED ORDER — ACETAMINOPHEN 500 MG PO TABS: 1000.0000 mg | ORAL_TABLET | Freq: Three times a day (TID) | ORAL | Status: DC | PRN

## 2014-10-14 MED ORDER — SODIUM CHLORIDE 0.9 % IV SOLN: Freq: Once | INTRAVENOUS | Status: DC

## 2014-10-14 MED ORDER — DIPHENHYDRAMINE HCL 50 MG/ML IJ SOLN: 50.0000 mg | Freq: Once | INTRAMUSCULAR | Status: DC

## 2014-10-14 MED ORDER — HEPARIN (PORCINE) IN NACL 100-0.45 UNIT/ML-% IJ SOLN
500.0000 [IU]/h | INTRAMUSCULAR | Status: DC
  Administered 2014-10-14: 500 [IU]/h via INTRAVENOUS
  Filled 2014-10-14: qty 250

## 2014-10-14 MED ORDER — MIDAZOLAM HCL 5 MG/5ML IJ SOLN
INTRAMUSCULAR | Status: DC | PRN
  Administered 2014-10-14: 1 mg via INTRAVENOUS

## 2014-10-14 MED ORDER — INSULIN ASPART 100 UNIT/ML ~~LOC~~ SOLN: 0.0000 [IU] | Freq: Three times a day (TID) | SUBCUTANEOUS | Status: DC

## 2014-10-14 MED ORDER — LORAZEPAM 1 MG PO TABS: 1.0000 mg | ORAL_TABLET | Freq: Every day | ORAL | Status: DC | PRN

## 2014-10-14 MED ORDER — SODIUM CHLORIDE 0.9 % IV SOLN
INTRAVENOUS | Status: DC
  Administered 2014-10-14: 17:00:00 via INTRAVENOUS

## 2014-10-14 MED ORDER — DIPHENHYDRAMINE HCL 50 MG/ML IJ SOLN
INTRAMUSCULAR | Status: DC | PRN
  Administered 2014-10-14: 50 mg via INTRAVENOUS

## 2014-10-14 MED ORDER — IOHEXOL 300 MG/ML  SOLN
150.0000 mL | Freq: Once | INTRAMUSCULAR | Status: AC | PRN
  Administered 2014-10-14: 70 mL via INTRAVENOUS

## 2014-10-14 MED ORDER — HEPARIN SODIUM (PORCINE) 1000 UNIT/ML IJ SOLN
INTRAMUSCULAR | Status: DC | PRN
  Administered 2014-10-14 (×3): 500 [IU] via INTRAVENOUS
  Administered 2014-10-14: 3000 [IU] via INTRAVENOUS

## 2014-10-14 MED ORDER — FENTANYL CITRATE 0.05 MG/ML IJ SOLN
INTRAMUSCULAR | Status: DC | PRN
  Administered 2014-10-14 (×2): 50 ug via INTRAVENOUS

## 2014-10-14 MED ORDER — CLOPIDOGREL BISULFATE 75 MG PO TABS
75.0000 mg | ORAL_TABLET | Freq: Every day | ORAL | Status: DC
  Administered 2014-10-15: 75 mg via ORAL
  Filled 2014-10-14 (×2): qty 1

## 2014-10-14 MED ORDER — NITROGLYCERIN 1 MG/10 ML FOR IR/CATH LAB
INTRA_ARTERIAL | Status: AC
  Filled 2014-10-14: qty 10

## 2014-10-14 MED ORDER — FELODIPINE ER 5 MG PO TB24
5.0000 mg | ORAL_TABLET | Freq: Every day | ORAL | Status: DC
  Administered 2014-10-15: 5 mg via ORAL
  Filled 2014-10-14 (×2): qty 1

## 2014-10-14 MED ORDER — ROSUVASTATIN CALCIUM 20 MG PO TABS
20.0000 mg | ORAL_TABLET | Freq: Every day | ORAL | Status: DC
  Administered 2014-10-14: 20 mg via ORAL
  Filled 2014-10-14 (×2): qty 1

## 2014-10-14 MED ORDER — ONDANSETRON HCL 4 MG/2ML IJ SOLN: 4.0000 mg | Freq: Four times a day (QID) | INTRAMUSCULAR | Status: DC | PRN

## 2014-10-14 MED ORDER — ROCURONIUM BROMIDE 100 MG/10ML IV SOLN
INTRAVENOUS | Status: DC | PRN
  Administered 2014-10-14: 30 mg via INTRAVENOUS

## 2014-10-14 MED ORDER — ONDANSETRON HCL 4 MG/2ML IJ SOLN: 4.0000 mg | Freq: Once | INTRAMUSCULAR | Status: DC

## 2014-10-14 MED ORDER — LIDOCAINE HCL 4 % MT SOLN
OROMUCOSAL | Status: DC | PRN
  Administered 2014-10-14: 4 mL via TOPICAL

## 2014-10-14 MED ORDER — LACTATED RINGERS IV SOLN
INTRAVENOUS | Status: DC | PRN
Start: 2014-10-14 — End: 2014-10-14
  Administered 2014-10-14 (×2): via INTRAVENOUS

## 2014-10-14 MED ORDER — LAMOTRIGINE 100 MG PO TABS
100.0000 mg | ORAL_TABLET | Freq: Every morning | ORAL | Status: DC
  Administered 2014-10-15: 100 mg via ORAL
  Filled 2014-10-14: qty 1

## 2014-10-14 MED ORDER — ACETAMINOPHEN 500 MG PO TABS
1000.0000 mg | ORAL_TABLET | Freq: Four times a day (QID) | ORAL | Status: DC | PRN
  Administered 2014-10-14: 1000 mg via ORAL
  Filled 2014-10-14: qty 2

## 2014-10-14 MED ORDER — DEXAMETHASONE SODIUM PHOSPHATE 10 MG/ML IJ SOLN
INTRAMUSCULAR | Status: DC | PRN
  Administered 2014-10-14: 4 mg via INTRAVENOUS

## 2014-10-14 MED ORDER — DOCUSATE SODIUM 100 MG PO CAPS
100.0000 mg | ORAL_CAPSULE | Freq: Every day | ORAL | Status: DC
  Administered 2014-10-14 – 2014-10-15 (×2): 100 mg via ORAL
  Filled 2014-10-14 (×2): qty 1

## 2014-10-14 MED ORDER — FUROSEMIDE 40 MG PO TABS
40.0000 mg | ORAL_TABLET | Freq: Every day | ORAL | Status: DC
  Administered 2014-10-14 – 2014-10-15 (×2): 40 mg via ORAL
  Filled 2014-10-14 (×2): qty 1

## 2014-10-14 NOTE — Transfer of Care (Signed)
Immediate Anesthesia Transfer of Care Note  Patient: Jill [name]  Procedure(s) Performed: Procedure(s): ANGIOPLASTY (N/A)  Patient Location: PACU  Anesthesia Type:General  Level of Consciousness: awake and alert   Airway & Oxygen Therapy: Patient Spontanous Breathing and Patient connected to nasal cannula oxygen  Post-op Assessment: Report given to RN, Post -op Vital signs reviewed and stable and Patient moving all extremities  Post vital signs: Reviewed and stable  Last Vitals:  Filed Vitals:   10/14/14 1215  BP:   Pulse: 72  Temp:   Resp: 24    Complications: No apparent anesthesia complications

## 2014-10-14 NOTE — Anesthesia Preprocedure Evaluation (Signed)
Anesthesia Evaluation  Patient identified by MRN, date of birth, ID band Patient awake    Reviewed: Allergy & Precautions, H&P , NPO status , Patient's Chart, lab work & pertinent test results  Airway Mallampati: III  TM Distance: >3 FB Neck ROM: Full    Dental no notable dental hx. (+) Teeth Intact, Dental Advisory Given   Pulmonary sleep apnea and Continuous Positive Airway Pressure Ventilation , Current Smoker,  breath sounds clear to auscultation  Pulmonary exam normal       Cardiovascular hypertension, Pt. on medications Rhythm:Regular Rate:Normal     Neuro/Psych Seizures -, Well Controlled,  Anxiety CVA, No Residual Symptoms negative psych ROS   GI/Hepatic Neg liver ROS, GERD-  Medicated and Controlled,  Endo/Other  diabetes, Type 2, Oral Hypoglycemic AgentsMorbid obesity  Renal/GU negative Renal ROS  negative genitourinary   Musculoskeletal   Abdominal   Peds  Hematology negative hematology ROS (+)   Anesthesia Other Findings   Reproductive/Obstetrics negative OB ROS                             Anesthesia Physical Anesthesia Plan  ASA: III  Anesthesia Plan: General   Post-op Pain Management:    Induction: Intravenous  Airway Management Planned: Oral ETT  Additional Equipment: Arterial line  Intra-op Plan:   Post-operative Plan: Extubation in OR  Informed Consent: I have reviewed the patients History and Physical, chart, labs and discussed the procedure including the risks, benefits and alternatives for the proposed anesthesia with the patient or authorized representative who has indicated his/her understanding and acceptance.   Dental advisory given  Plan Discussed with: CRNA  Anesthesia Plan Comments:         Anesthesia Quick Evaluation

## 2014-10-14 NOTE — Procedures (Signed)
S/P Lt ICA angiogram followed by balloon angioplasty of supraclinoid LT ICA with less than 20 % residual stenosis

## 2014-10-14 NOTE — Progress Notes (Signed)
Referring Physician(s): Dr Merlene Laughter  Subjective:  L ICA stenosis Angioplasty with Dr Estanislado Pandy 10/14/14 Has done well Complaining of severe back pain Otherwise comfortable  Allergies: Dilaudid; Contrast media; Crab; and Other  Medications: Prior to Admission medications   Medication Sig Start Date End Date Taking? Authorizing Provider  aspirin 81 MG tablet Take 81 mg by mouth daily.   Yes Historical Provider, MD  clopidogrel (PLAVIX) 75 MG tablet Take 75 mg by mouth every morning.   Yes Historical Provider, MD  docusate sodium (COLACE) 100 MG capsule Take 100 mg by mouth daily.   Yes Historical Provider, MD  felodipine (PLENDIL) 5 MG 24 hr tablet Take 5 mg by mouth daily before breakfast.    Yes Historical Provider, MD  furosemide (LASIX) 80 MG tablet Take 0.5 tablets (40 mg total) by mouth daily. 09/29/14  Yes Jettie Booze, MD  insulin aspart (NOVOLOG) 100 UNIT/ML injection Inject 20 Units into the skin at bedtime.    Yes Historical Provider, MD  insulin detemir (LEVEMIR) 100 UNIT/ML injection Inject 60 Units into the skin at bedtime.   Yes Historical Provider, MD  lamoTRIgine (LAMICTAL) 100 MG tablet Take 100 mg by mouth every morning.    Yes Historical Provider, MD  levETIRAcetam (KEPPRA) 500 MG tablet Take 250 mg by mouth every 12 (twelve) hours.   Yes Historical Provider, MD  LORazepam (ATIVAN) 1 MG tablet Take 1 mg by mouth daily as needed for anxiety.   Yes Historical Provider, MD  metFORMIN (GLUCOPHAGE) 1000 MG tablet Take 1 tablet (1,000 mg total) by mouth 2 (two) times daily with a meal. 09/30/14  Yes Jettie Booze, MD  Multiple Vitamin (MULITIVITAMIN WITH MINERALS) TABS Take 1 tablet by mouth every morning.    Yes Historical Provider, MD  nicotine (NICODERM CQ - DOSED IN MG/24 HOURS) 21 mg/24hr patch Place 21 mg onto the skin daily.   Yes Historical Provider, MD  olmesartan (BENICAR) 40 MG tablet Take 40 mg by mouth every morning.    Yes Historical Provider, MD    pioglitazone (ACTOS) 15 MG tablet Take 15 mg by mouth every morning.    Yes Historical Provider, MD  potassium chloride SA (K-DUR,KLOR-CON) 20 MEQ tablet Take 40 mEq by mouth daily.    Yes Historical Provider, MD  ranitidine (ZANTAC) 150 MG tablet Take 1 tablet (150 mg total) by mouth 2 (two) times daily. 11/24/13  Yes [name], MD  rosuvastatin (CRESTOR) 20 MG tablet Take 20 mg by mouth every morning.    Yes Historical Provider, MD  acetaminophen (TYLENOL) 500 MG tablet Take 1,000 mg by mouth every 8 (eight) hours as needed. For pain    Historical Provider, MD     Vital Signs: BP 133/59 mmHg  Pulse 81  Temp(Src) 98.3 F (36.8 C) (Oral)  Resp 15  Ht 5\' 6"  (1.676 m)  Wt 140.161 kg (309 lb)  BMI 49.90 kg/m2  SpO2 98%  LMP 09/30/2014  Physical Exam  Constitutional: She is oriented to person, place, and time. She appears well-developed.  HENT:  Face symmetrical Puffs cheeks =  Eyes: EOM are normal.  Neck: Normal range of motion.  Cardiovascular: Normal rate and regular rhythm.   Pulmonary/Chest: Effort normal and breath sounds normal.  Abdominal: Soft. Bowel sounds are normal. There is no tenderness.  Rt groin NT; no bleeding No hematoma  Musculoskeletal: Normal range of motion.  Moving all 4s Good sensation Good strength Rt foot 2+ pulses  Neurological: She  is alert and oriented to person, place, and time.  Skin: Skin is warm and dry.  Psychiatric: She has a normal mood and affect. Her behavior is normal. Judgment and thought content normal.  Nursing note and vitals reviewed.   Imaging: No results found.  Labs:  CBC:  Recent Labs  09/25/14 0949 10/09/14 1433  WBC 7.6 9.6  HGB 13.8 13.7  HCT 41.1 40.9  PLT 209 263    COAGS:  Recent Labs  09/25/14 0949 10/09/14 1433  INR 0.97 0.98  APTT  --  31    BMP:  Recent Labs  09/25/14 0949 10/09/14 1433 10/14/14 0947 10/14/14 1111  NA 135 139  --   --   K 4.0 4.0  --   --   CL 105 103  --   --    CO2 22 24  --   --   GLUCOSE 251* 230* 177* 203*  BUN 10 10  --   --   CALCIUM 9.8 10.1  --   --   CREATININE 0.54 0.60  --   --   GFRNONAA >90 >90  --   --   GFRAA >90 >90  --   --     LIVER FUNCTION TESTS:  Recent Labs  10/09/14 1433  BILITOT 0.8  AST 23  ALT 23  ALKPHOS 73  PROT 7.4  ALBUMIN 3.9    Assessment and Plan:  L ICA stenosis pta in IR today Doing well Back pain---Toradol 30 mg IV now; 1 x in 6 hrs if needed All home meds renewed Dr Estanislado Pandy will see pt asap  Signed: Abbygale Lapid A 10/14/2014, 3:40 PM   I spent a total of 15 Minutes in face to face in clinical consultation/evaluation, greater than 50% of which was counseling/coordinating care for L ICA pta

## 2014-10-14 NOTE — Anesthesia Procedure Notes (Signed)
Procedure Name: Intubation Date/Time: 10/14/2014 9:00 AM Performed by: Suzy Bouchard Pre-anesthesia Checklist: Patient identified, Emergency Drugs available, Suction available, Patient being monitored and Timeout performed Patient Re-evaluated:Patient Re-evaluated prior to inductionOxygen Delivery Method: Circle system utilized Preoxygenation: Pre-oxygenation with 100% oxygen Intubation Type: IV induction Ventilation: Mask ventilation without difficulty Laryngoscope Size: Miller and 2 Grade View: Grade I Tube type: Oral Number of attempts: 1 Airway Equipment and Method: Stylet and LTA kit utilized Placement Confirmation: positive ETCO2,  ETT inserted through vocal cords under direct vision and CO2 detector Secured at: 22 cm Tube secured with: Tape Dental Injury: Teeth and Oropharynx as per pre-operative assessment

## 2014-10-14 NOTE — H&P (Signed)
Chief Complaint: History of strokes with abnormal recent carotid US and need to have W/U prior to gastric bypass  Referring Physician(s): Dr. Cathlean Sauer  History of Present Illness: Jill [name] is a 49 y.o. female with history of CVA, recent carotid US on 09/11/14 revealed high resistant waveform of RICA. Her last cerebral arteriogram was on 05/2008, last MRI 08/25/11 with acute infarction in the right corona radiata. She denies any new neurological changes with extremity weakness, speech or vision. She denies any headaches. She does admit to intermittent LLE weakness which she states is secondary to old stroke in 2013. She denies any chest pain, shortness of breath or palpitations. She denies any active signs of bleeding or excessive bruising. She denies any recent fever or chills. The patient does have OSA and uses a CPAP. 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 She has now nee scheduled for balloon/stent angioplasty with general anesthesia. Has been NPO this am. Taking ASA 81mg  and Plavix 75mg  daily. Questionable hx of contrast allergy causing N/V only. Pt will be given IV Zofran and Benadryl.    Past Medical History  Diagnosis Date  . Hypertension   . Diabetes mellitus   . 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 Hypercholesterolemia   . Fibroids   . Heart murmur   . Sleep apnea     CPAP q night , last study > 3 yrs. ago  . Seizures     04/2010  . Anxiety   . Chronic kidney disease     appt. /w urology 11/10/2014- for a cyst seen on [place]  . GERD (gastroesophageal reflux disease)   . Back pain     complains when laying on hard flat surface  . History of stress test     referred for cardiac cath- done here at [facility]- 09/2014    Past Surgical History  Procedure Laterality Date  . Cholecystectomy    . Cesarean section  [place]    x2  . Left heart catheterization with  coronary angiogram N/A 09/28/2014    Procedure: LEFT HEART CATHETERIZATION WITH CORONARY ANGIOGRAM;  Surgeon: Jettie Booze, MD;  Location: [facility] CATH LAB;  Service: Cardiovascular;  Laterality: N/A;  . Cardiac catheterization    . Appendectomy    . Tubal ligation      Allergies: Dilaudid; Contrast media; Crab; and Other  Medications: Prior to Admission medications   Medication Sig Start Date End Date Taking? Authorizing Provider  acetaminophen (TYLENOL) 500 MG tablet Take 1,000 mg by mouth every 8 (eight) hours as needed. For pain   Yes Historical Provider, MD  aspirin 81 MG tablet Take 81 mg by mouth daily.   Yes Historical Provider, MD  clopidogrel (PLAVIX) 75 MG tablet Take 75 mg by mouth every morning.   Yes Historical Provider, MD  docusate sodium (COLACE) 100 MG capsule Take 100 mg by mouth daily.   Yes Historical Provider, MD  felodipine (PLENDIL) 5 MG 24 hr tablet Take 5 mg by mouth daily.   Yes Historical Provider, MD  furosemide (LASIX) 80 MG tablet Take 40 mg by mouth daily.   Yes Historical Provider, MD  insulin aspart (NOVOLOG) 100 UNIT/ML injection Inject 20 Units into the skin at bedtime.    Yes Historical Provider, MD  insulin detemir (LEVEMIR) 100 UNIT/ML injection Inject 60 Units into the skin at bedtime.  Yes Historical Provider, MD  lamoTRIgine (LAMICTAL) 100 MG tablet Take 100 mg by mouth every morning.    Yes Historical Provider, MD  levETIRAcetam (KEPPRA) 500 MG tablet Take 250 mg by mouth every 12 (twelve) hours.   Yes Historical Provider, MD  metFORMIN (GLUCOPHAGE) 1000 MG tablet Take 1,000 mg by mouth 2 (two) times daily with a meal.   Yes Historical Provider, MD  Multiple Vitamin (MULITIVITAMIN WITH MINERALS) TABS Take 1 tablet by mouth every morning.    Yes Historical Provider, MD  nicotine (NICODERM CQ - DOSED IN MG/24 HOURS) 21 mg/24hr patch Place 21 mg onto the skin daily.   Yes Historical Provider, MD  olmesartan (BENICAR) 40 MG tablet Take 40 mg by mouth  every morning.    Yes Historical Provider, MD  pioglitazone (ACTOS) 15 MG tablet Take 15 mg by mouth every morning.    Yes Historical Provider, MD  potassium chloride SA (K-DUR,KLOR-CON) 20 MEQ tablet Take 40 mEq by mouth daily.    Yes Historical Provider, MD  ranitidine (ZANTAC) 150 MG tablet Take 1 tablet (150 mg total) by mouth 2 (two) times daily. 11/24/13  Yes [name], MD  rosuvastatin (CRESTOR) 20 MG tablet Take 20 mg by mouth every morning.    Yes Historical Provider, MD  ibuprofen (ADVIL,MOTRIN) 800 MG tablet Take 1 tablet (800 mg total) by mouth every 6 (six) hours as needed for pain. Patient not taking: Reported on 09/23/2014 12/17/12   Jacqulynn Cadet, MD     No family history on file.  History   Social History  . Marital Status: Married    Spouse Name: N/A  . Number of Children: N/A  . Years of Education: N/A   Social History Main Topics  . Smoking status: Current Some Day Smoker -- 0.25 packs/day for 20 years    Types: Cigarettes    Start date: 07/25/1975  . Smokeless tobacco: Never Used  . Alcohol Use: No     Comment: rare use   . Drug Use: No  . Sexual Activity: Yes    Birth Control/ Protection: Other-see comments     Comment: Tubal Ligation   Other Topics Concern  . Not on file   Social History Narrative   Review of Systems: A 12 point ROS discussed and pertinent positives are indicated in the HPI above.  All other systems are negative.  Review of Systems  Vital Signs: LMP 09/29/2014  Physical Exam  Constitutional: She is oriented to person, place, and time. No distress.  HENT:  Head: Normocephalic and atraumatic.  Neck: No tracheal deviation present.  Cardiovascular: Normal rate and regular rhythm.  Exam reveals no friction rub.   No murmur heard. Pulmonary/Chest: Effort normal and breath sounds normal. No respiratory distress. She has no wheezes. She has no rales.  Abdominal: Soft. Bowel sounds are normal. She exhibits no distension. There is  no tenderness.  Neurological: She is alert and oriented to person, place, and time.  Speech clear, strength equal upper and lower extremities, smile symmetrical   Skin: She is not diaphoretic.     Imaging: Ir Angiogram Extremity Left  09/28/2014   CLINICAL DATA:  Progressive bilateral carotid artery stenoses. Previous history of a right hemispheric ischemic stroke.  EXAM: BILATERAL COMMON CAROTID AND INNOMINATE ANGIOGRAPHY AND BILATERAL VERTEBRAL ARTERY ANGIOGRAMS  PROCEDURE: Contrast:  60 mL OMNIPAQUE IOHEXOL 300 MG/ML  SOLN  Anesthesia/Sedation:  Conscious sedation.  Medications: Versed 2 mg IV.  Fentanyl 75 mcg IV.  Following  a full explanation of the procedure along with the potential associated complications, an informed witnessed consent was obtained.  The right groin was prepped and draped in the usual sterile fashion. Thereafter using modified Seldinger technique, transfemoral access into the right common femoral artery was obtained without difficulty. Over a 0.035 inch guidewire, a 5 French Pinnacle sheath was inserted. Through this, and also over 0.035 inch guidewire, a 5 French JB1 catheter was advanced to the aortic arch region and selectively positioned in the right common carotid artery, the right vertebral artery, the left common carotid artery and the left vertebral artery, and left subclavian artery.  There were no acute complications. The patient tolerated the procedure well.  FINDINGS: The right common carotid arteriogram demonstrates the right external carotid artery and its major branches to be widely patent.  The right internal carotid artery just distal to the bulb demonstrates significantly decreased caliber to the cranial skull base. There is a tight focal stenosis at the cervical petrous junction. Slow advancement of contrast is seen to the right internal carotid artery proximal supraclinoid segment where there is complete occlusion, just distal to the origin of the right ophthalmic  artery. There is no intracranial opacification of vasculature noted.  The right vertebral artery origin is normal.  The vessel seen to opacify normally to the cranial skull base. Normal opacification is seen of the right vertebrobasilar junction and the right posterior inferior cerebellar artery.  The basilar artery, the posterior cerebral arteries, the superior cerebellar arteries and the anterior-inferior cerebellar arteries opacify normally into the capillary and venous phases.  There is prompt retrograde opacification via the posterior communicating arteries bilaterally of the right middle cerebral artery and the anterior cerebral artery distributions.  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  Retrograde opacification of the right middle cerebral artery M1 segment is also noted with subsequent opacification into the right MCA distribution. A focal area of hypoperfusion is seen involving the right subcortical frontal parietal region. The left common carotid arteriogram demonstrates the left external carotid artery and its major branches to be widely patent.  The left internal carotid artery at the bulb to the cranial skull base opacifies normally.  The petrous and the proximal cavernous segment are widely patent.  There is severe focal circumferential stenosis of the proximal supraclinoid segment of 75-80%. Mild post stenotic dilatation is seen of the supraclinoid segment. The left middle cerebral artery and the left anterior cerebral artery opacify normally into the capillary and venous phases. Cross filling via the anterior communicating artery of the right anterior cerebral artery A2 segment is also seen.  Prominent vein of Labbe is seen draining into the distal transverse sinus and subsequently drainage into the left sigmoid sinus and the left internal jugular vein.  The left vertebral artery origin is from the aortic arch between the origins of the left common carotid artery and the left subclavian artery.  The vessel is seen to opacify normally to the cranial skull base. Normal opacification is seen of the left posterior inferior cerebellar artery and the left vertebrobasilar junction.  The basilar artery, the posterior cerebral artery, the superior cerebellar arteries and the visualized anterior inferior cerebral arteries are seen to opacify normally into the capillary and venous phases. Non-opacified blood is seen in the basilar artery from the contralateral vertebral artery. The left subclavian arteriogram demonstrates a prominent internal mammary artery projecting  inferiorly and anteriorly.  The thyrocervical trunk branch appears widely patent.  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  Retrograde opacification of the right middle and the right anterior cerebral artery distribution from the vertebral artery injection retrogradely via the posterior communicating artery.  The angiographic findings were reviewed with the patient and the patient's husband. Compared to the arteriograms of 2009 there has been significant progression of the atherosclerotic disease involving the carotid arteries with occlusion on the right side and tight 75-80% narrowing on the left. She was asked to continue taking her aspirin and Plavix. She was also advised not to take her metformin for 2 days. She can resume this on Sunday.  The patient would like to discuss her options regarding further management in view of the above findings. A consultation will be arranged for next week.   Electronically Signed   By: Luanne Bras M.D.   On: 09/25/2014 12:43     Labs:  CBC:  Recent Labs  09/25/14 0949 10/09/14 1433  WBC 7.6  9.6  HGB 13.8 13.7  HCT 41.1 40.9  PLT 209 263    COAGS:  Recent Labs  09/25/14 0949 10/09/14 1433  INR 0.97 0.98  APTT  --  31    BMP:  Recent Labs  09/25/14 0949 10/09/14 1433  NA 135 139  K 4.0 4.0  CL 105 103  CO2 22 24  GLUCOSE 251* 230*  BUN 10 10  CALCIUM 9.8 10.1  CREATININE 0.54 0.60  GFRNONAA >90 >90  GFRAA >90 >90    Assessment and Plan: History of CVA Carotid US 09/11/14 with high resistant waveform of LICA Scheduled today for cerebral arteriogram with planned angioplasty of (L)ICA stenosis The patient has been NPO, on plavix, labs and vitals have been reviewed. Risks and Benefits discussed with the patient including, but not limited to bleeding, infection, vascular injury or stroke.  The patient will admitted for observation post procedure All of the patient's questions were answered, patient is agreeable to proceed.  SignedAscencion Dike 10/14/2014, 8:08 AM   I spent a total of 20 Minutes in face to face in clinical consultation, greater than 50% of which was counseling/coordinating care for stenosis and abnormal carotid ultrasound.

## 2014-10-14 NOTE — Anesthesia Postprocedure Evaluation (Signed)
  Anesthesia Post-op Note  Patient: Jill [name]  Procedure(s) Performed: Procedure(s): ANGIOPLASTY (N/A)  Patient Location: PACU  Anesthesia Type:General  Level of Consciousness: awake and alert   Airway and Oxygen Therapy: Patient Spontanous Breathing  Post-op Pain: none  Post-op Assessment: Post-op Vital signs reviewed, Patient's Cardiovascular Status Stable and Respiratory Function Stable  Post-op Vital Signs: Reviewed  Filed Vitals:   10/14/14 1330  BP: 105/41  Pulse: 81  Temp:   Resp: 23    Complications: No apparent anesthesia complications

## 2014-10-14 NOTE — Progress Notes (Addendum)
ANTICOAGULATION CONSULT NOTE - Initial Consult  Pharmacy Consult for heparin Indication: post intracranial angioplasty  Allergies  Allergen Reactions  . Dilaudid [Hydromorphone Hcl] Nausea And Vomiting  . Contrast Media [Iodinated Diagnostic Agents] Nausea And Vomiting    "contrast dye vs dilaudid gave pt N&V with last procedure"  . Crab [Shellfish Allergy] Swelling  . Other Other (See Comments)    "pt is a difficult IV stick, prefers IV team paged for IV starts"    Patient Measurements: Height: 5\' 6"  (167.6 cm) Weight: (!) 309 lb (140.161 kg) IBW/kg (Calculated) : 59.3 Heparin Dosing Weight: ~ 92 kg   Vital Signs: Temp: 98.1 F (36.7 C) (03/23 1213) Temp Source: Oral (03/23 0700) BP: 105/41 mmHg (03/23 1330) Pulse Rate: 81 (03/23 1330)  Labs: No results for input(s): HGB, HCT, PLT, APTT, LABPROT, INR, HEPARINUNFRC, CREATININE, CKTOTAL, CKMB, TROPONINI in the last 72 hours.  Estimated Creatinine Clearance: 123.1 mL/min (by C-G formula based on Cr of 0.6).   Medical History: Past Medical History  Diagnosis Date  . Hypertension   . Diabetes mellitus   . 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 Hypercholesterolemia   . Fibroids   . Heart murmur   . Sleep apnea     CPAP q night , last study > 3 yrs. ago  . Seizures     04/2010  . Anxiety   . Chronic kidney disease     appt. /w urology 11/10/2014- for a cyst seen on [place]  . GERD (gastroesophageal reflux disease)   . Back pain     complains when laying on hard flat surface  . History of stress test     referred for cardiac cath- done [facility] at [facility]- 09/2014    Medications:  Prescriptions prior to admission  Medication Sig Dispense Refill Last Dose  . aspirin 81 MG tablet Take 81 mg by mouth daily.   10/14/2014 at 0500  . clopidogrel (PLAVIX) 75 MG tablet Take 75 mg by mouth every morning.   10/14/2014 at 0500  . docusate sodium (COLACE) 100 MG capsule Take 100 mg by mouth daily.   10/13/2014 at Unknown  time  . felodipine (PLENDIL) 5 MG 24 hr tablet Take 5 mg by mouth daily before breakfast.    10/14/2014 at 0500  . furosemide (LASIX) 80 MG tablet Take 0.5 tablets (40 mg total) by mouth daily.   10/13/2014 at Unknown time  . insulin aspart (NOVOLOG) 100 UNIT/ML injection Inject 20 Units into the skin at bedtime.    10/13/2014 at Unknown time  . insulin detemir (LEVEMIR) 100 UNIT/ML injection Inject 60 Units into the skin at bedtime.   10/13/2014 at Unknown time  . lamoTRIgine (LAMICTAL) 100 MG tablet Take 100 mg by mouth every morning.    10/14/2014 at 0500  . levETIRAcetam (KEPPRA) 500 MG tablet Take 250 mg by mouth every 12 (twelve) hours.   10/14/2014 at 0500  . LORazepam (ATIVAN) 1 MG tablet Take 1 mg by mouth daily as needed for anxiety.   10/14/2014 at 0500  . metFORMIN (GLUCOPHAGE) 1000 MG tablet Take 1 tablet (1,000 mg total) by mouth 2 (two) times daily with a meal.   10/13/2014 at Unknown time  . Multiple Vitamin (MULITIVITAMIN WITH MINERALS) TABS Take 1 tablet by mouth every morning.    10/13/2014 at Unknown time  . nicotine (NICODERM CQ - DOSED IN MG/24 HOURS) 21 mg/24hr patch Place 21 mg onto the skin daily.  10/14/2014 at Unknown time  . olmesartan (BENICAR) 40 MG tablet Take 40 mg by mouth every morning.    10/13/2014 at Unknown time  . pioglitazone (ACTOS) 15 MG tablet Take 15 mg by mouth every morning.    10/13/2014 at Unknown time  . potassium chloride SA (K-DUR,KLOR-CON) 20 MEQ tablet Take 40 mEq by mouth daily.    10/13/2014 at Unknown time  . ranitidine (ZANTAC) 150 MG tablet Take 1 tablet (150 mg total) by mouth 2 (two) times daily.   10/14/2014 at 0500  . rosuvastatin (CRESTOR) 20 MG tablet Take 20 mg by mouth every morning.    10/13/2014 at Unknown time  . acetaminophen (TYLENOL) 500 MG tablet Take 1,000 mg by mouth every 8 (eight) hours as needed. For pain   More than a month at Unknown time    Assessment: 49 yo F s/p Lt ICA angiogram followed by balloon angioplasty of supraclinoid L  ICA.  Pharmacy consulted to dose heparin for a goal of aPTT of 50-60 seconds which correlates to HL of 0.1 - 0.25 units/ml. A total of 4500 units of heparin was given during the procedure and a drip was started at 500 units/hr in the NPACU.  Wt  140 kg.  Baseline coags WNL, baseline CBC WNL.  Goal of Therapy:  Heparin level 0.1-0.25 units/ml Monitor platelets by anticoagulation protocol: Yes   Plan:  -heparin drip started in NPACU at 500 units/hr at 1222 -heparin drip increased to 900 units/hr at 1345 -check HL 6 hrs after rate increased to 900 units/hr at 2000 -heparin to be turned off tomorrow morning at 0700 for sheath removal  Eudelia Bunch, Pharm.D. [phone] 10/14/2014 1:57 PM   *   *   *   *   *   ADDENDUM:    ASSESSMENT/GOALS:  Heparin infusing at 900 units/hr.  Heparin level 0.17 units/ml, within therapeutic goal range  Goal of therapy is Heparin Level 0.1 - 0.25 units/ml  PLAN:   Continue Heparin at 900 units/hr.  Heparin to be discontinued at 0700 AM tomorrow AM.  Marthenia Rolling,  Pharm.D   10/14/2014,  8:16 PM

## 2014-10-15 LAB — CBC WITH DIFFERENTIAL/PLATELET
Basophils Absolute: 0 10*3/uL (ref 0.0–0.1)
Basophils Relative: 0 % (ref 0–1)
Eosinophils Absolute: 0.2 10*3/uL (ref 0.0–0.7)
Eosinophils Relative: 2 % (ref 0–5)
HEMATOCRIT: 39.4 % (ref 36.0–46.0)
HEMOGLOBIN: 12.8 g/dL (ref 12.0–15.0)
LYMPHS PCT: 25 % (ref 12–46)
Lymphs Abs: 2.9 10*3/uL (ref 0.7–4.0)
MCH: 28.4 pg (ref 26.0–34.0)
MCHC: 32.5 g/dL (ref 30.0–36.0)
MCV: 87.6 fL (ref 78.0–100.0)
MONO ABS: 0.8 10*3/uL (ref 0.1–1.0)
Monocytes Relative: 7 % (ref 3–12)
NEUTROS PCT: 66 % (ref 43–77)
Neutro Abs: 7.9 10*3/uL — ABNORMAL HIGH (ref 1.7–7.7)
Platelets: 232 10*3/uL (ref 150–400)
RBC: 4.5 MIL/uL (ref 3.87–5.11)
RDW: 14.7 % (ref 11.5–15.5)
WBC: 11.9 10*3/uL — AB (ref 4.0–10.5)

## 2014-10-15 LAB — BASIC METABOLIC PANEL
Anion gap: 8 (ref 5–15)
BUN: 12 mg/dL (ref 6–23)
CO2: 21 mmol/L (ref 19–32)
Calcium: 9.4 mg/dL (ref 8.4–10.5)
Chloride: 107 mmol/L (ref 96–112)
Creatinine, Ser: 0.6 mg/dL (ref 0.50–1.10)
GFR calc non Af Amer: >90 mL/min (ref >90)
Glucose, Bld: 281 mg/dL — ABNORMAL HIGH (ref 70–99)
Potassium: 4 mmol/L (ref 3.5–5.1)
SODIUM: 136 mmol/L (ref 135–145)

## 2014-10-15 NOTE — Discharge Summary (Signed)
Patient ID: JAYDE MCALLISTER MRN: [id number] DOB/AGE: [birth year] 50 y.o.  Admit date: 10/14/2014 Discharge date: 10/15/2014  Admission Diagnoses: Left Internal Carotid Artery stenosis  Discharge Diagnoses: L ICA angioplasty  Active Problems:   Carotid stenosis, symptomatic w/o infarct HTN; HLD; PFO  Discharged Condition: stable; improved  Hospital Course: Pt with hx CVA. L internal carotid artery stenosis. Angioplasty performed in IR with Dr Estanislado Pandy 10/14/14. Pt tolerated procedure well. Overnight stay in Neuro ICU without issue. Eating well; No N/V. Slept well Ambulating Dr Estanislado Pandy has seen and examined pt. For discharge to home now. Continue all home meds; especially ASA 81 and Plavix 75 mg daily   Consults: None  Significant Diagnostic Studies: Cerebral arteriogram  Treatments: L internal carotid artery stenosis angioplasty  Discharge Exam: Blood pressure 142/66, pulse 62, temperature 98.3 F (36.8 C), temperature source Oral, resp. rate 17, height 5\' 6"  (1.676 m), weight 140.161 kg (309 lb), last menstrual period 09/30/2014, SpO2 97 %.  PE:  Afeb; vss A/O Face symmetrical; puffs cheeks = [place]: RRR Lungs: CTA Abd: soft; =BS; NT Extr: FROM; ambulating without assistance Rt groin NT; no bleeding; no hematoma Rt foot: 2+ pulses UOP good- yellow Labs wnl  Results for orders placed or performed during the hospital encounter of 10/14/14  Glucose, capillary  Result Value Ref Range   Glucose-Capillary 141 (H) 70 - 99 mg/dL  Heparin level (unfractionated)  Result Value Ref Range   Heparin Unfractionated 0.17 (L) 0.30 - 0.70 IU/mL  Basic metabolic panel  Result Value Ref Range   Sodium 136 135 - 145 mmol/L   Potassium 4.0 3.5 - 5.1 mmol/L   Chloride 107 96 - 112 mmol/L   CO2 21 19 - 32 mmol/L   Glucose, Bld 281 (H) 70 - 99 mg/dL   BUN 12 6 - 23 mg/dL   Creatinine, Ser 0.60 0.50 - 1.10 mg/dL   Calcium 9.4 8.4 - 10.5 mg/dL   GFR calc non Af Amer  >90 >90 mL/min   GFR calc Af Amer >90 >90 mL/min   Anion gap 8 5 - 15  CBC WITH DIFFERENTIAL  Result Value Ref Range   WBC 11.9 (H) 4.0 - 10.5 K/uL   RBC 4.50 3.87 - 5.11 MIL/uL   Hemoglobin 12.8 12.0 - 15.0 g/dL   HCT 39.4 36.0 - 46.0 %   MCV 87.6 78.0 - 100.0 fL   MCH 28.4 26.0 - 34.0 pg   MCHC 32.5 30.0 - 36.0 g/dL   RDW 14.7 11.5 - 15.5 %   Platelets 232 150 - 400 K/uL   Neutrophils Relative % 66 43 - 77 %   Neutro Abs 7.9 (H) 1.7 - 7.7 K/uL   Lymphocytes Relative 25 12 - 46 %   Lymphs Abs 2.9 0.7 - 4.0 K/uL   Monocytes Relative 7 3 - 12 %   Monocytes Absolute 0.8 0.1 - 1.0 K/uL   Eosinophils Relative 2 0 - 5 %   Eosinophils Absolute 0.2 0.0 - 0.7 K/uL   Basophils Relative 0 0 - 1 %   Basophils Absolute 0.0 0.0 - 0.1 K/uL    Disposition: L ICA stenosis angioplasty 3/23 Doing well; tolerated procedure well No complications For discharge to home now Cont all meds---ASA 81 mg and Plavix 75 mg daily Return in 2 weeks for follow up with Dr Estanislado Pandy Pt has good understanding of dc instructions   Discharge Instructions    Call MD for:  difficulty breathing, headache or visual disturbances  Complete by:  As directed      Call MD for:  extreme fatigue    Complete by:  As directed      Call MD for:  hives    Complete by:  As directed      Call MD for:  persistant dizziness or light-headedness    Complete by:  As directed      Call MD for:  persistant nausea and vomiting    Complete by:  As directed      Call MD for:  redness, tenderness, or signs of infection (pain, swelling, redness, odor or green/yellow discharge around incision site)    Complete by:  As directed      Call MD for:  severe uncontrolled pain    Complete by:  As directed      Call MD for:  temperature >100.4    Complete by:  As directed      Diet - low sodium heart healthy    Complete by:  As directed      Discharge instructions    Complete by:  As directed   Continue Asa 81 ans Plavix 75 mg daily;  cont all home meds; 2 week follow up with Dr Estanislado Pandy- pt will hear from scheduler for time and date; call [phone] if need anything     Discharge wound care:    Complete by:  As directed   May shower today; replace Rt groin band aid daily with clean band aid x 1 week     Driving Restrictions    Complete by:  As directed   No driving x 2weeks     Increase activity slowly    Complete by:  As directed      Lifting restrictions    Complete by:  As directed   No lifting over 10 lbs x 2 weeks            Medication List    TAKE these medications        acetaminophen 500 MG tablet  Commonly known as:  TYLENOL  Take 1,000 mg by mouth every 8 (eight) hours as needed. For pain     aspirin 81 MG tablet  Take 81 mg by mouth daily.     clopidogrel 75 MG tablet  Commonly known as:  PLAVIX  Take 75 mg by mouth every morning.     docusate sodium 100 MG capsule  Commonly known as:  COLACE  Take 100 mg by mouth daily.     felodipine 5 MG 24 hr tablet  Commonly known as:  PLENDIL  Take 5 mg by mouth daily before breakfast.     furosemide 80 MG tablet  Commonly known as:  LASIX  Take 0.5 tablets (40 mg total) by mouth daily.     insulin aspart 100 UNIT/ML injection  Commonly known as:  novoLOG  Inject 20 Units into the skin at bedtime.     insulin detemir 100 UNIT/ML injection  Commonly known as:  LEVEMIR  Inject 60 Units into the skin at bedtime.     lamoTRIgine 100 MG tablet  Commonly known as:  LAMICTAL  Take 100 mg by mouth every morning.     levETIRAcetam 500 MG tablet  Commonly known as:  KEPPRA  Take 250 mg by mouth every 12 (twelve) hours.     LORazepam 1 MG tablet  Commonly known as:  ATIVAN  Take 1 mg by mouth daily as needed for anxiety.  metFORMIN 1000 MG tablet  Commonly known as:  GLUCOPHAGE  Take 1 tablet (1,000 mg total) by mouth 2 (two) times daily with a meal.     multivitamin with minerals Tabs tablet  Take 1 tablet by mouth every morning.      nicotine 21 mg/24hr patch  Commonly known as:  NICODERM CQ - dosed in mg/24 hours  Place 21 mg onto the skin daily.     olmesartan 40 MG tablet  Commonly known as:  BENICAR  Take 40 mg by mouth every morning.     pioglitazone 15 MG tablet  Commonly known as:  ACTOS  Take 15 mg by mouth every morning.     potassium chloride SA 20 MEQ tablet  Commonly known as:  K-DUR,KLOR-CON  Take 40 mEq by mouth daily.     ranitidine 150 MG tablet  Commonly known as:  ZANTAC  Take 1 tablet (150 mg total) by mouth 2 (two) times daily.     rosuvastatin 20 MG tablet  Commonly known as:  CRESTOR  Take 20 mg by mouth every morning.          Signed: Sulo Janczak A 10/15/2014, 10:20 AM   I have spent Less Than 30 Minutes discharging Alexiana M Chalk.

## 2014-10-15 NOTE — Progress Notes (Signed)
Referring Physician(s): Dr Iona Beard  Subjective:  L Internal Carotid Artery stenosis Angioplasty 3/23 in IR with Dr Estanislado Pandy Has done well overnight Plan for dc this am after seen bt MD  Allergies: Dilaudid; Contrast media; Crab; and Other  Medications: Prior to Admission medications   Medication Sig Start Date End Date Taking? Authorizing Provider  aspirin 81 MG tablet Take 81 mg by mouth daily.   Yes Historical Provider, MD  clopidogrel (PLAVIX) 75 MG tablet Take 75 mg by mouth every morning.   Yes Historical Provider, MD  docusate sodium (COLACE) 100 MG capsule Take 100 mg by mouth daily.   Yes Historical Provider, MD  felodipine (PLENDIL) 5 MG 24 hr tablet Take 5 mg by mouth daily before breakfast.    Yes Historical Provider, MD  furosemide (LASIX) 80 MG tablet Take 0.5 tablets (40 mg total) by mouth daily. 09/29/14  Yes Jettie Booze, MD  insulin aspart (NOVOLOG) 100 UNIT/ML injection Inject 20 Units into the skin at bedtime.    Yes Historical Provider, MD  insulin detemir (LEVEMIR) 100 UNIT/ML injection Inject 60 Units into the skin at bedtime.   Yes Historical Provider, MD  lamoTRIgine (LAMICTAL) 100 MG tablet Take 100 mg by mouth every morning.    Yes Historical Provider, MD  levETIRAcetam (KEPPRA) 500 MG tablet Take 250 mg by mouth every 12 (twelve) hours.   Yes Historical Provider, MD  LORazepam (ATIVAN) 1 MG tablet Take 1 mg by mouth daily as needed for anxiety.   Yes Historical Provider, MD  metFORMIN (GLUCOPHAGE) 1000 MG tablet Take 1 tablet (1,000 mg total) by mouth 2 (two) times daily with a meal. 09/30/14  Yes Jettie Booze, MD  Multiple Vitamin (MULITIVITAMIN WITH MINERALS) TABS Take 1 tablet by mouth every morning.    Yes Historical Provider, MD  nicotine (NICODERM CQ - DOSED IN MG/24 HOURS) 21 mg/24hr patch Place 21 mg onto the skin daily.   Yes Historical Provider, MD  olmesartan (BENICAR) 40 MG tablet Take 40 mg by mouth every morning.    Yes  Historical Provider, MD  pioglitazone (ACTOS) 15 MG tablet Take 15 mg by mouth every morning.    Yes Historical Provider, MD  potassium chloride SA (K-DUR,KLOR-CON) 20 MEQ tablet Take 40 mEq by mouth daily.    Yes Historical Provider, MD  ranitidine (ZANTAC) 150 MG tablet Take 1 tablet (150 mg total) by mouth 2 (two) times daily. 11/24/13  Yes [name], MD  rosuvastatin (CRESTOR) 20 MG tablet Take 20 mg by mouth every morning.    Yes Historical Provider, MD  acetaminophen (TYLENOL) 500 MG tablet Take 1,000 mg by mouth every 8 (eight) hours as needed. For pain    Historical Provider, MD     Vital Signs: BP 142/66 mmHg  Pulse 62  Temp(Src) 98.3 F (36.8 C) (Oral)  Resp 24  Ht 5\' 6"  (1.676 m)  Wt 140.161 kg (309 lb)  BMI 49.90 kg/m2  SpO2 97%  LMP 09/30/2014  Physical Exam  Abdominal:  Rt groin NT Clean and dry/ NT No bleeding No hematoma Rt foot 2+ pulses    Imaging: No results found.  Labs:  CBC:  Recent Labs  09/25/14 0949 10/09/14 1433  WBC 7.6 9.6  HGB 13.8 13.7  HCT 41.1 40.9  PLT 209 263    COAGS:  Recent Labs  09/25/14 0949 10/09/14 1433  INR 0.97 0.98  APTT  --  31    BMP:  Recent  Labs  09/25/14 0949 10/09/14 1433 10/14/14 0947 10/14/14 1111  NA 135 139  --   --   K 4.0 4.0  --   --   CL 105 103  --   --   CO2 22 24  --   --   GLUCOSE 251* 230* 177* 203*  BUN 10 10  --   --   CALCIUM 9.8 10.1  --   --   CREATININE 0.54 0.60  --   --   GFRNONAA >90 >90  --   --   GFRAA >90 >90  --   --     LIVER FUNCTION TESTS:  Recent Labs  10/09/14 1433  BILITOT 0.8  AST 23  ALT 23  ALKPHOS 73  PROT 7.4  ALBUMIN 3.9    Assessment and Plan:  L ICA angioplasty 3/23 Doing well Plan for dc soon Report to Dr Estanislado Pandy  Signed: Monia Sabal A 10/15/2014, 8:00 AM   I spent a total of 15 Minutes in face to face in clinical consultation/evaluation, greater than 50% of which was counseling/coordinating care for L ICA  pta

## 2014-10-15 NOTE — Progress Notes (Signed)
RT Note:  Arterial Line will not draw back blood for a.m. Labs.  Unable to pick up waveform.  Discontinued.  Per RN, do not reinsert at this time.

## 2014-10-19 ENCOUNTER — Encounter (HOSPITAL_COMMUNITY): Payer: Self-pay | Admitting: Interventional Radiology

## 2014-10-19 NOTE — Progress Notes (Addendum)
Patient ID: Jill [name], female   DOB: 09/24/1965, 49 y.o.   MRN: [id number]   Procedure of L ICA angioplasty was performed in IR 10/14/14 with Dr Estanislado Pandy Overnight hospital stay was without event Discharged to home on ASA 81 mg and Plavix 75 mg daily 10/15/14  Pt returns today complaining of Rt upper/outer thigh numbness.  Pt walks into Rad RN station without problem Able to bear weight without pain Rt groin site soft; NT Clean and dry No sign of redness or infection No hematoma Rt foot 2+ pulses Rt upper outer thigh is NT No signs of trauma; no bruising No swelling No redness Pt states she feels numb at this same area.  Decreased sensation may be from nerve irritation secondary to procedure She did have back pain post procedure from laying on hard table Resolved with Toradol 30 mg IV (x2 doses) post procedure Decreased sensation at upper outer Rt thigh could be related to this previous back pain  Reassurance May take Ibuprofen 200 mg 2 po every 8-12 hrs for few days. If continues to be painful or worsen  She is to call IR PA desk  She leaves here with good understanding and agreement of plan

## 2014-10-29 ENCOUNTER — Ambulatory Visit (HOSPITAL_COMMUNITY)
Admission: RE | Admit: 2014-10-29 | Discharge: 2014-10-29 | Disposition: A | Discharge status: Home / Self Care | Payer: BLUE CROSS/BLUE SHIELD | Source: Ambulatory Visit | Attending: Radiology | Admitting: Radiology

## 2014-10-29 DIAGNOSIS — I639 Cerebral infarction, unspecified: Secondary | ICD-10-CM

## 2014-10-29 DIAGNOSIS — Q211 Atrial septal defect: Secondary | ICD-10-CM

## 2014-10-29 DIAGNOSIS — I10 Essential (primary) hypertension: Secondary | ICD-10-CM

## 2014-10-29 DIAGNOSIS — Q2112 Patent foramen ovale: Secondary | ICD-10-CM

## 2014-10-29 DIAGNOSIS — E785 Hyperlipidemia, unspecified: Secondary | ICD-10-CM

## 2014-10-29 DIAGNOSIS — I6522 Occlusion and stenosis of left carotid artery: Secondary | ICD-10-CM

## 2014-10-29 DIAGNOSIS — I771 Stricture of artery: Secondary | ICD-10-CM

## 2014-10-29 DIAGNOSIS — R9439 Abnormal result of other cardiovascular function study: Secondary | ICD-10-CM

## 2014-10-30 ENCOUNTER — Encounter (HOSPITAL_COMMUNITY): Payer: Self-pay | Admitting: *Deleted

## 2014-10-30 ENCOUNTER — Emergency Department (HOSPITAL_COMMUNITY): Payer: BLUE CROSS/BLUE SHIELD

## 2014-10-30 ENCOUNTER — Emergency Department (HOSPITAL_COMMUNITY)
Admission: EM | Admit: 2014-10-30 | Discharge: 2014-10-30 | Disposition: A | Discharge status: Home / Self Care | Payer: BLUE CROSS/BLUE SHIELD | Attending: Emergency Medicine | Admitting: Emergency Medicine

## 2014-10-30 DIAGNOSIS — Z720 Tobacco use: Secondary | ICD-10-CM | POA: Insufficient documentation

## 2014-10-30 DIAGNOSIS — Z8673 Personal history of transient ischemic attack (TIA), and cerebral infarction without residual deficits: Secondary | ICD-10-CM | POA: Diagnosis not present

## 2014-10-30 DIAGNOSIS — N189 Chronic kidney disease, unspecified: Secondary | ICD-10-CM | POA: Diagnosis not present

## 2014-10-30 DIAGNOSIS — Z794 Long term (current) use of insulin: Secondary | ICD-10-CM | POA: Insufficient documentation

## 2014-10-30 DIAGNOSIS — E119 Type 2 diabetes mellitus without complications: Secondary | ICD-10-CM | POA: Diagnosis not present

## 2014-10-30 DIAGNOSIS — I129 Hypertensive chronic kidney disease with stage 1 through stage 4 chronic kidney disease, or unspecified chronic kidney disease: Secondary | ICD-10-CM | POA: Diagnosis not present

## 2014-10-30 DIAGNOSIS — Z9981 Dependence on supplemental oxygen: Secondary | ICD-10-CM | POA: Insufficient documentation

## 2014-10-30 DIAGNOSIS — Z9889 Other specified postprocedural states: Secondary | ICD-10-CM | POA: Insufficient documentation

## 2014-10-30 DIAGNOSIS — M79604 Pain in right leg: Secondary | ICD-10-CM | POA: Diagnosis present

## 2014-10-30 DIAGNOSIS — Z8742 Personal history of other diseases of the female genital tract: Secondary | ICD-10-CM | POA: Insufficient documentation

## 2014-10-30 DIAGNOSIS — Z7982 Long term (current) use of aspirin: Secondary | ICD-10-CM | POA: Diagnosis not present

## 2014-10-30 DIAGNOSIS — R011 Cardiac murmur, unspecified: Secondary | ICD-10-CM | POA: Diagnosis not present

## 2014-10-30 DIAGNOSIS — E780 Pure hypercholesterolemia: Secondary | ICD-10-CM | POA: Insufficient documentation

## 2014-10-30 DIAGNOSIS — F419 Anxiety disorder, unspecified: Secondary | ICD-10-CM | POA: Insufficient documentation

## 2014-10-30 DIAGNOSIS — Z8719 Personal history of other diseases of the digestive system: Secondary | ICD-10-CM | POA: Insufficient documentation

## 2014-10-30 DIAGNOSIS — Z7902 Long term (current) use of antithrombotics/antiplatelets: Secondary | ICD-10-CM | POA: Insufficient documentation

## 2014-10-30 DIAGNOSIS — Z79899 Other long term (current) drug therapy: Secondary | ICD-10-CM | POA: Insufficient documentation

## 2014-10-30 DIAGNOSIS — G40909 Epilepsy, unspecified, not intractable, without status epilepticus: Secondary | ICD-10-CM | POA: Insufficient documentation

## 2014-10-30 LAB — BASIC METABOLIC PANEL
ANION GAP: 10 (ref 5–15)
BUN: 18 mg/dL (ref 6–23)
CALCIUM: 10.3 mg/dL (ref 8.4–10.5)
CHLORIDE: 104 mmol/L (ref 96–112)
CO2: 24 mmol/L (ref 19–32)
CREATININE: 0.68 mg/dL (ref 0.50–1.10)
GFR calc Af Amer: >90 mL/min (ref >90)
GFR calc non Af Amer: >90 mL/min (ref >90)
Glucose, Bld: 171 mg/dL — ABNORMAL HIGH (ref 70–99)
Potassium: 3.8 mmol/L (ref 3.5–5.1)
SODIUM: 138 mmol/L (ref 135–145)

## 2014-10-30 LAB — CBC WITH DIFFERENTIAL/PLATELET
BASOS ABS: 0.1 10*3/uL (ref 0.0–0.1)
Basophils Relative: 1 % (ref 0–1)
Eosinophils Absolute: 0.5 10*3/uL (ref 0.0–0.7)
Eosinophils Relative: 5 % (ref 0–5)
HCT: 39.8 % (ref 36.0–46.0)
Hemoglobin: 13.2 g/dL (ref 12.0–15.0)
Lymphocytes Relative: 24 % (ref 12–46)
Lymphs Abs: 2 10*3/uL (ref 0.7–4.0)
MCH: 29.2 pg (ref 26.0–34.0)
MCHC: 33.2 g/dL (ref 30.0–36.0)
MCV: 88.1 fL (ref 78.0–100.0)
Monocytes Absolute: 0.6 10*3/uL (ref 0.1–1.0)
Monocytes Relative: 7 % (ref 3–12)
NEUTROS ABS: 5.4 10*3/uL (ref 1.7–7.7)
NEUTROS PCT: 63 % (ref 43–77)
Platelets: 232 10*3/uL (ref 150–400)
RBC: 4.52 MIL/uL (ref 3.87–5.11)
RDW: 14.3 % (ref 11.5–15.5)
WBC: 8.5 10*3/uL (ref 4.0–10.5)

## 2014-10-30 MED ORDER — LORAZEPAM 1 MG PO TABS
2.0000 mg | ORAL_TABLET | Freq: Once | ORAL | Status: AC
  Administered 2014-10-30: 2 mg via ORAL
  Filled 2014-10-30: qty 2

## 2014-10-30 NOTE — ED Notes (Signed)
Pt states she was unable to tolerate having the MRI. EDP aware

## 2014-10-30 NOTE — ED Notes (Signed)
Pt had angioplasty 3/23  Since then has tingling and swelling rt leg.  Lt ankle swelling

## 2014-10-30 NOTE — ED Notes (Signed)
Patient with no complaints at this time. Respirations even and unlabored. Skin warm/dry. Discharge instructions reviewed with patient at this time. Patient given opportunity to voice concerns/ask questions. Patient discharged at this time and left Emergency Department with steady gait.   

## 2014-10-30 NOTE — ED Provider Notes (Signed)
CSN: [id number]     Arrival date & time 10/30/14  1400 History   First MD Initiated Contact with Patient 10/30/14 1422     Chief Complaint  Patient presents with  . Leg Pain     (Consider location/radiation/quality/duration/timing/severity/associated sxs/prior Treatment) HPI  49 year old female presents with right lateral thigh pain as well as associated swelling and numbness at that area for the past couple weeks. On 3/23 she had a cerebral angiogram. She states that the groin site has not hurt or been swollen. This is lateral to this site. Now that she thinks of it, she thinks she may of actually had the pain she is presenting for the day or 2 before her procedure. She was talking to her doctor who said she should come to the ER to get a ultrasound to rule out DVT. There is no weakness or numbness in the rest of her leg. She is able to ambulate. She has a high bed and when getting in to the bed occasionally this exacerbates the pain. She's been taking ibuprofen and Tylenol with good relief. No fevers. The patient has not noticed any redness. No known injury to this area. Rates her pain as mild at this time.  Past Medical History  Diagnosis Date  . Hypertension   . Diabetes mellitus   . 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 Hypercholesterolemia   . Fibroids   . Heart murmur   . Sleep apnea     CPAP q night , last study > 3 yrs. ago  . Seizures     04/2010  . Anxiety   . Chronic kidney disease     appt. /w urology 11/10/2014- for a cyst seen on [place]  . GERD (gastroesophageal reflux disease)   . Back pain     complains when laying on hard flat surface  . History of stress test     referred for cardiac cath- done here at [facility]- 09/2014   Past Surgical History  Procedure Laterality Date  . Cholecystectomy    . Cesarean section  [place]    x2  . Left heart catheterization with coronary angiogram N/A 09/28/2014    Procedure: LEFT HEART CATHETERIZATION WITH CORONARY  ANGIOGRAM;  Surgeon: Jettie Booze, MD;  Location: [facility] CATH LAB;  Service: Cardiovascular;  Laterality: N/A;  . Cardiac catheterization    . Appendectomy    . Tubal ligation    . Radiology with anesthesia N/A 10/14/2014    Procedure: ANGIOPLASTY;  Surgeon: Luanne Bras, MD;  Location: [place];  Service: Radiology;  Laterality: N/A;   History reviewed. No pertinent family history. History  Substance Use Topics  . Smoking status: Current Some Day Smoker -- 0.25 packs/day for 20 years    Types: Cigarettes    Start date: 07/25/1975  . Smokeless tobacco: Never Used  . Alcohol Use: No     Comment: rare use    OB History    No data available     Review of Systems  Constitutional: Negative for fever.  Musculoskeletal: Positive for arthralgias. Negative for joint swelling.  Neurological: Positive for numbness. Negative for weakness and headaches.  All other systems reviewed and are negative.     Allergies  Dilaudid; Contrast media; Crab; and Other  Home Medications   Prior to Admission medications   Medication Sig Start Date End Date Taking? Authorizing Provider  acetaminophen (TYLENOL) 500 MG tablet Take 1,000 mg by  mouth every 8 (eight) hours as needed. For pain    Historical Provider, MD  aspirin 81 MG tablet Take 81 mg by mouth daily.    Historical Provider, MD  clopidogrel (PLAVIX) 75 MG tablet Take 75 mg by mouth every morning.    Historical Provider, MD  docusate sodium (COLACE) 100 MG capsule Take 100 mg by mouth daily.    Historical Provider, MD  felodipine (PLENDIL) 5 MG 24 hr tablet Take 5 mg by mouth daily before breakfast.     Historical Provider, MD  furosemide (LASIX) 80 MG tablet Take 0.5 tablets (40 mg total) by mouth daily. 09/29/14   Jettie Booze, MD  insulin aspart (NOVOLOG) 100 UNIT/ML injection Inject 20 Units into the skin at bedtime.     Historical Provider, MD  insulin detemir (LEVEMIR) 100 UNIT/ML injection Inject 60 Units into the skin at  bedtime.    Historical Provider, MD  lamoTRIgine (LAMICTAL) 100 MG tablet Take 100 mg by mouth every morning.     Historical Provider, MD  levETIRAcetam (KEPPRA) 500 MG tablet Take 250 mg by mouth every 12 (twelve) hours.    Historical Provider, MD  LORazepam (ATIVAN) 1 MG tablet Take 1 mg by mouth daily as needed for anxiety.    Historical Provider, MD  metFORMIN (GLUCOPHAGE) 1000 MG tablet Take 1 tablet (1,000 mg total) by mouth 2 (two) times daily with a meal. 09/30/14   Jettie Booze, MD  Multiple Vitamin (MULITIVITAMIN WITH MINERALS) TABS Take 1 tablet by mouth every morning.     Historical Provider, MD  nicotine (NICODERM CQ - DOSED IN MG/24 HOURS) 21 mg/24hr patch Place 21 mg onto the skin daily.    Historical Provider, MD  olmesartan (BENICAR) 40 MG tablet Take 40 mg by mouth every morning.     Historical Provider, MD  pioglitazone (ACTOS) 15 MG tablet Take 15 mg by mouth every morning.     Historical Provider, MD  potassium chloride SA (K-DUR,KLOR-CON) 20 MEQ tablet Take 40 mEq by mouth daily.     Historical Provider, MD  ranitidine (ZANTAC) 150 MG tablet Take 1 tablet (150 mg total) by mouth 2 (two) times daily. 11/24/13   [name], MD  rosuvastatin (CRESTOR) 20 MG tablet Take 20 mg by mouth every morning.     Historical Provider, MD   BP 137/77 mmHg  Pulse 95  Temp(Src) 98.1 F (36.7 C) (Oral)  Resp 18  Ht 5\' 6"  (1.676 m)  Wt 310 lb (140.615 kg)  BMI 50.06 kg/m2  SpO2 100%  LMP 09/29/2014 Physical Exam  Constitutional: She is oriented to person, place, and time. She appears well-developed and well-nourished.  HENT:  Head: Normocephalic and atraumatic.  Right Ear: External ear normal.  Left Ear: External ear normal.  Nose: Nose normal.  Eyes: Right eye exhibits no discharge. Left eye exhibits no discharge.  Cardiovascular: Normal rate and intact distal pulses.   Pulses:      Dorsalis pedis pulses are 2+ on the right side, and 2+ on the left side.    Pulmonary/Chest: Effort normal.  Abdominal: Soft.  Musculoskeletal:       Right hip: She exhibits tenderness.       Right knee: No tenderness found.       Right upper leg: She exhibits no swelling.       Right lower leg: She exhibits no tenderness and no swelling.       Legs: Neurological: She is alert and oriented  to person, place, and time.  Skin: Skin is warm and dry.  Nursing note and vitals reviewed.   ED Course  Procedures (including critical care time) Labs Review Labs Reviewed  BASIC METABOLIC PANEL - Abnormal; Notable for the following:    Glucose, Bld 171 (*)    All other components within normal limits  CBC WITH DIFFERENTIAL/PLATELET    Imaging Review US Venous Img Lower Unilateral Right  10/30/2014   CLINICAL DATA:  Right leg pain and swelling.  EXAM: Right LOWER EXTREMITY VENOUS DOPPLER ULTRASOUND  TECHNIQUE: Gray-scale sonography with graded compression, as well as color Doppler and duplex ultrasound were performed to evaluate the lower extremity deep venous systems from the level of the common femoral vein and including the common femoral, femoral, profunda femoral, popliteal and calf veins including the posterior tibial, peroneal and gastrocnemius veins when visible. The superficial great saphenous vein was also interrogated. Spectral Doppler was utilized to evaluate flow at rest and with distal augmentation maneuvers in the common femoral, femoral and popliteal veins.  COMPARISON:  None.  FINDINGS: Contralateral Common Femoral Vein: Respiratory phasicity is normal and symmetric with the symptomatic side. No evidence of thrombus. Normal compressibility.  Common Femoral Vein: No evidence of thrombus. Normal compressibility, respiratory phasicity and response to augmentation.  Saphenofemoral Junction: No evidence of thrombus. Normal compressibility and flow on color Doppler imaging.  Profunda Femoral Vein: No evidence of thrombus. Normal compressibility and flow on color  Doppler imaging.  Femoral Vein: No evidence of thrombus. Normal compressibility, respiratory phasicity and response to augmentation.  Popliteal Vein: No evidence of thrombus. Normal compressibility, respiratory phasicity and response to augmentation.  Calf Veins: No evidence of thrombus. Normal compressibility and flow on color Doppler imaging.  Superficial Great Saphenous Vein: No evidence of thrombus. Normal compressibility and flow on color Doppler imaging.  IMPRESSION: No evidence of deep venous thrombosis.   Electronically Signed   By: Suzy Bouchard M.D.   On: 10/30/2014 16:25   Dg Hip Unilat With Pelvis 2-3 Views Right  10/30/2014   CLINICAL DATA:  Right hip pain.  Pelvic pain.  Recent angioplasty.  EXAM: RIGHT HIP (WITH PELVIS) 2-3 VIEWS  COMPARISON:  None.  FINDINGS: Right iliac artery stent. There is a mildly asymmetric sclerosis laterally in the right femoral neck compared to the contralateral side.  Articular space in both hips is preserved. No cortical discontinuity in either hip assumes identified. Vascular stent noted proximally in the right thigh.  IMPRESSION: 1. Asymmetric sclerosis along the right lateral femoral neck compared to the left. Although this is an atypical location for a stress fracture, which more commonly medial, the possibility of unusual stress reaction as a cause for this difference is raised as a possibility. Dedicated MRI of the hip could be utilized to further assess. Given the small area of involvement, entities such as Paget's disease are considered less likely.   Electronically Signed   By: Van Clines M.D.   On: 10/30/2014 15:40     EKG Interpretation None      MDM   Final diagnoses:  Right leg pain    Patient has no evidence of DVT. Patient is neurovascularly intact. Mild pain at the site of right greater trochanter. X-ray shows asymmetric sclerosis that could possibly be from a stress fracture. Patient does have some pain with ambulation but does  not run or do other strenuous activity. Tried to do an MRI after giving oral Ativan but patient could not tolerate as her head  had to be too close to the machine while leg was in the MRI. Discussed giving her more medicines but she declines MRI. Given her symptoms been going on for almost 3 weeks and she has some signs on the left on her x-ray I doubt she has a stress fracture. We discussed options and she is okay with following up with her PCP as soon as possible and getting an outpatient MRI where they can possibly do sedation. Discussed with radiology who feels a CT would not be beneficial at all.    Sherwood Gambler, MD 10/30/14 [phone]

## 2014-11-10 ENCOUNTER — Ambulatory Visit: Payer: BLUE CROSS/BLUE SHIELD | Admitting: Urology

## 2014-11-19 DIAGNOSIS — F54 Psychological and behavioral factors associated with disorders or diseases classified elsewhere: Secondary | ICD-10-CM | POA: Diagnosis not present

## 2014-11-19 DIAGNOSIS — F4310 Post-traumatic stress disorder, unspecified: Secondary | ICD-10-CM | POA: Diagnosis not present

## 2014-11-25 DIAGNOSIS — F17200 Nicotine dependence, unspecified, uncomplicated: Secondary | ICD-10-CM | POA: Diagnosis not present

## 2014-11-25 DIAGNOSIS — Z7902 Long term (current) use of antithrombotics/antiplatelets: Secondary | ICD-10-CM | POA: Diagnosis not present

## 2014-11-25 DIAGNOSIS — S93402A Sprain of unspecified ligament of left ankle, initial encounter: Secondary | ICD-10-CM | POA: Diagnosis not present

## 2014-11-25 DIAGNOSIS — M25572 Pain in left ankle and joints of left foot: Secondary | ICD-10-CM | POA: Diagnosis not present

## 2014-11-25 DIAGNOSIS — Z79899 Other long term (current) drug therapy: Secondary | ICD-10-CM | POA: Diagnosis not present

## 2014-11-25 DIAGNOSIS — Z794 Long term (current) use of insulin: Secondary | ICD-10-CM | POA: Diagnosis not present

## 2014-12-03 DIAGNOSIS — F54 Psychological and behavioral factors associated with disorders or diseases classified elsewhere: Secondary | ICD-10-CM | POA: Diagnosis not present

## 2014-12-03 DIAGNOSIS — F4310 Post-traumatic stress disorder, unspecified: Secondary | ICD-10-CM | POA: Diagnosis not present

## 2014-12-28 DIAGNOSIS — Z713 Dietary counseling and surveillance: Secondary | ICD-10-CM | POA: Diagnosis not present

## 2015-01-12 ENCOUNTER — Encounter: Payer: Self-pay | Admitting: *Deleted

## 2015-01-14 ENCOUNTER — Encounter: Payer: Self-pay | Admitting: *Deleted

## 2015-01-14 ENCOUNTER — Encounter: Payer: Self-pay | Admitting: Cardiology

## 2015-01-14 ENCOUNTER — Ambulatory Visit (INDEPENDENT_AMBULATORY_CARE_PROVIDER_SITE_OTHER): Payer: BLUE CROSS/BLUE SHIELD | Admitting: Cardiology

## 2015-01-14 VITALS — BP 136/81 | HR 73 | Ht 66.0 in | Wt 310.8 lb

## 2015-01-14 DIAGNOSIS — I639 Cerebral infarction, unspecified: Secondary | ICD-10-CM

## 2015-01-14 DIAGNOSIS — E785 Hyperlipidemia, unspecified: Secondary | ICD-10-CM

## 2015-01-14 DIAGNOSIS — R9439 Abnormal result of other cardiovascular function study: Secondary | ICD-10-CM

## 2015-01-14 DIAGNOSIS — I10 Essential (primary) hypertension: Secondary | ICD-10-CM

## 2015-01-14 NOTE — Patient Instructions (Signed)
Your physician wants you to follow-up in: [place] DR. BRANCH You will receive a reminder letter in the mail two months in advance. If you don't receive a letter, please call our office to schedule the follow-up appointment.   Your physician recommends that you continue on your current medications as directed. Please refer to the Current Medication list given to you today.  WE WILL REQUEST LABS FROM DR. HILL  Thank you for choosing [facility]!!

## 2015-01-14 NOTE — Progress Notes (Signed)
Clinical Summary Ms. Redler is a 49 y.o.female seen today for follow up of the following medical problems.   1. Abnormal stress test/Chest pain - symptoms have historically been most consistent with GI etiology. Often coming on after meals, especially after spicy foods. Better with alka seltzer, improved with zantac. - as part of a bariatric surgery preop workup she had a nuclear stress that was abnormal - follow up cath 09/2014 showed mild to moderate non-obstructive disease.   - no recent symptoms, remains symptom free since starting zantac   2. Obesity - undergoing evaluation for bariatric surgery - awaiting clearance regarding her recent carotid procedure   3. Hyperlipidemia  - followed by PCP, on crestor 20mg  daily.  - no recent panel in our system  4. HTN  - compliant with meds  - checks at home occasionally, typically around 120/60s.   5. PAD - prior right lower intervention with PTA and stenting of right common iliac and right SFA in Jan 2010 - denies any claudication  6. OSA - compliant with CPAP  7. Tobacco - continues to smoke, working to stop  8. Carotid stenosis - followed by vascular. History of left ICA angioplasty 09/2014  Past Medical History  Diagnosis Date  . Hypertension   . Diabetes mellitus   . 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 Hypercholesterolemia   . Fibroids   . Heart murmur   . Sleep apnea     CPAP q night , last study > 3 yrs. ago  . Seizures     04/2010  . Anxiety   . Chronic kidney disease     appt. /w urology 11/10/2014- for a cyst seen on [place]  . GERD (gastroesophageal reflux disease)   . Back pain     complains when laying on hard flat surface  . History of stress test     referred for cardiac cath- done here at [facility]- 09/2014     Allergies  Allergen Reactions  . Dilaudid [Hydromorphone Hcl] Nausea And Vomiting  . Contrast Media [Iodinated Diagnostic Agents] Nausea And Vomiting    "contrast  dye vs dilaudid gave pt N&V with last procedure"  . Crab [Shellfish Allergy] Swelling  . Other Other (See Comments)    "pt is a difficult IV stick, prefers IV team paged for IV starts"     Current Outpatient Prescriptions  Medication Sig Dispense Refill  . acetaminophen (TYLENOL) 500 MG tablet Take 1,000 mg by mouth every 8 (eight) hours as needed. For pain    . aspirin EC 81 MG tablet Take 81 mg by mouth every morning.    . clopidogrel (PLAVIX) 75 MG tablet Take 75 mg by mouth every morning.    . docusate sodium (COLACE) 100 MG capsule Take 100 mg by mouth daily.    . felodipine (PLENDIL) 5 MG 24 hr tablet Take 5 mg by mouth daily before breakfast.     . furosemide (LASIX) 80 MG tablet Take 0.5 tablets (40 mg total) by mouth daily.    . insulin aspart (NOVOLOG) 100 UNIT/ML injection Inject 20 Units into the skin at bedtime.     . lamoTRIgine (LAMICTAL) 100 MG tablet Take 100 mg by mouth every morning.     . levETIRAcetam (KEPPRA) 500 MG tablet Take 250 mg by mouth every 12 (twelve) hours.    Marland Kitchen LORazepam (ATIVAN) 1 MG tablet Take 1 mg by mouth daily as needed for  anxiety.    . metFORMIN (GLUCOPHAGE) 1000 MG tablet Take 1 tablet (1,000 mg total) by mouth 2 (two) times daily with a meal.    . Multiple Vitamin (MULITIVITAMIN WITH MINERALS) TABS Take 1 tablet by mouth every morning.     . nicotine (NICODERM CQ - DOSED IN MG/24 HOURS) 21 mg/24hr patch Place 21 mg onto the skin daily.    Marland Kitchen olmesartan (BENICAR) 40 MG tablet Take 40 mg by mouth every morning.     . pioglitazone (ACTOS) 15 MG tablet Take 15 mg by mouth every morning.     . potassium chloride SA (K-DUR,KLOR-CON) 20 MEQ tablet Take 40 mEq by mouth daily.     . ranitidine (ZANTAC) 150 MG tablet Take 1 tablet (150 mg total) by mouth 2 (two) times daily.    . rosuvastatin (CRESTOR) 20 MG tablet Take 20 mg by mouth every morning.     Marland Kitchen TOUJEO SOLOSTAR 300 UNIT/ML SOPN Take 60 Units by mouth at bedtime.   0   No current  facility-administered medications for this visit.     Past Surgical History  Procedure Laterality Date  . Cholecystectomy    . Cesarean section  [place]    x2  . Left heart catheterization with coronary angiogram N/A 09/28/2014    Procedure: LEFT HEART CATHETERIZATION WITH CORONARY ANGIOGRAM;  Surgeon: Jettie Booze, MD;  Location: [facility] CATH LAB;  Service: Cardiovascular;  Laterality: N/A;  . Cardiac catheterization    . Appendectomy    . Tubal ligation    . Radiology with anesthesia N/A 10/14/2014    Procedure: ANGIOPLASTY;  Surgeon: Luanne Bras, MD;  Location: [place];  Service: Radiology;  Laterality: N/A;     Allergies  Allergen Reactions  . Dilaudid [Hydromorphone Hcl] Nausea And Vomiting  . Contrast Media [Iodinated Diagnostic Agents] Nausea And Vomiting    "contrast dye vs dilaudid gave pt N&V with last procedure"  . Crab [Shellfish Allergy] Swelling  . Other Other (See Comments)    "pt is a difficult IV stick, prefers IV team paged for IV starts"      Family History  Problem Relation Age of Onset  . Kidney disease Mother     dialysis for 26 years  . Heart failure Father   . Stroke Paternal Grandmother   . Heart disease Father   . Heart attack Mother     33's  . Heart attack Father     69's     Social History Ms. Disbrow reports that she has been smoking Cigarettes.  She started smoking about 39 years ago. She has a 5 pack-year smoking history. She has never used smokeless tobacco. Ms. Baby reports that she does not drink alcohol.   Review of Systems CONSTITUTIONAL: No weight loss, fever, chills, weakness or fatigue.  HEENT: Eyes: No visual loss, blurred vision, double vision or yellow sclerae.No hearing loss, sneezing, congestion, runny nose or sore throat.  SKIN: No rash or itching.  CARDIOVASCULAR: no chest pain, no palpitations RESPIRATORY: No shortness of breath, cough or sputum.  GASTROINTESTINAL: No anorexia, nausea, vomiting or  diarrhea. No abdominal pain or blood.  GENITOURINARY: No burning on urination, no polyuria NEUROLOGICAL: No headache, dizziness, syncope, paralysis, ataxia, numbness or tingling in the extremities. No change in bowel or bladder control.  MUSCULOSKELETAL: No muscle, back pain, joint pain or stiffness.  LYMPHATICS: No enlarged nodes. No history of splenectomy.  PSYCHIATRIC: No history of depression or anxiety.  ENDOCRINOLOGIC: No reports of  sweating, cold or heat intolerance. No polyuria or polydipsia.  Marland Kitchen   Physical Examination Filed Vitals:   01/14/15 1352  BP: 136/81  Pulse: 73   Filed Vitals:   01/14/15 1352  Height: 5\' 6"  (1.676 m)  Weight: 310 lb 12.8 oz (140.978 kg)    Gen: resting comfortably, no acute distress HEENT: no scleral icterus, pupils equal round and reactive, no palptable cervical adenopathy,  CV: RRR, no m/r/g, no JVD Resp: Clear to auscultation bilaterally GI: abdomen is soft, non-tender, non-distended, normal bowel sounds, no hepatosplenomegaly MSK: extremities are warm, no edema.  Skin: warm, no rash Neuro:  no focal deficits Psych: appropriate affect   Diagnostic Studies 08/2014 Lexiscan MPI IMPRESSION: 1. Large area of ischemia extending from the basal to distal anteroseptal wall.  2. Normal left ventricular wall motion.  3. Left ventricular ejection fraction 60 for%  4. High-risk stress test findings*.  09/2014 Cath HEMODYNAMICS: Aortic pressure was 142/66; LV pressure was 144/7; LVEDP 18. There was no gradient between the left ventricle and aorta.   ANGIOGRAPHIC DATA: The left main coronary artery is widely patent.  The left anterior descending artery is a large vessel which wraps around the apex. There is mild atherosclerosis in the mid vessel. There is a large diagonal vessel which arises proximally. There are several small diagonals which are patent.  The left circumflex artery is a large dominant vessel. There is a ramus vessel  which is large and widely patent. There is mild disease in the circumflex system. The first obtuse marginal is small but patent. There is a small left PDA which is patent.  The right coronary artery is a nondominant vessel. In the mid vessel, there is moderate disease.  LEFT VENTRICULOGRAM: Left ventricular angiogram was not done due to the question of a dye allergy. LVEDP was 18 mmHg.  IMPRESSIONS:  1. Normal left main coronary artery. 2. Mild disease in the left anterior descending artery and its branches. 3. Mild disease in the dominant left circumflex artery and its branches. 4. Moderate disease in the nondominant right coronary artery. 5. Left ventricular systolic function not assessed. LVEDP 18 mmHg. 6. False positive nuclear stress test.  RECOMMENDATION: Continue aggressive preventive therapy given her history of peripheral vascular disease and multiple risk factors for coronary atherosclerosis. She will follow-up with Dr. Harl Bowie.  Assessment and Plan  1. Abnormal stress test/Chest pain - chest pain symptoms most consistent with GI source, better with H2 blocker. No other chest pain symptoms since starting zantac - recent negative cath - continue risk factor modification  2. Hyperlipidemia - continue high dose statin - request labs from pcp  3. HTN - at goal, continue current meds  4. PAD - prior intervention, denies any recent symptoms  5. OSA - continue CPAP  6. Tobacco abuse - advised to quit. She states she has been advised against wellbutin and chantix due to being on seizure meds - advised compliance with nicotine patches  7. Hx of CVA - known cerebral vascular disease, s/p carotid angiogplasty. Continue to follow with vascular and IR, she reports she is due for a reevlauation of her carotid soon.    F/u 67m onths   [name], M.D.

## 2015-01-15 ENCOUNTER — Other Ambulatory Visit (HOSPITAL_COMMUNITY): Payer: Self-pay | Admitting: Interventional Radiology

## 2015-01-15 DIAGNOSIS — I771 Stricture of artery: Secondary | ICD-10-CM

## 2015-02-06 ENCOUNTER — Other Ambulatory Visit: Payer: Self-pay | Admitting: Radiology

## 2015-02-08 ENCOUNTER — Other Ambulatory Visit: Payer: Self-pay | Admitting: Radiology

## 2015-02-09 ENCOUNTER — Other Ambulatory Visit (HOSPITAL_COMMUNITY): Payer: Self-pay | Admitting: Interventional Radiology

## 2015-02-09 ENCOUNTER — Ambulatory Visit (HOSPITAL_COMMUNITY)
Admission: RE | Admit: 2015-02-09 | Discharge: 2015-02-09 | Disposition: A | Discharge status: Home / Self Care | Payer: BLUE CROSS/BLUE SHIELD | Source: Ambulatory Visit | Attending: Interventional Radiology | Admitting: Interventional Radiology

## 2015-02-09 ENCOUNTER — Encounter (HOSPITAL_COMMUNITY): Payer: Self-pay

## 2015-02-09 DIAGNOSIS — F17210 Nicotine dependence, cigarettes, uncomplicated: Secondary | ICD-10-CM | POA: Insufficient documentation

## 2015-02-09 DIAGNOSIS — N189 Chronic kidney disease, unspecified: Secondary | ICD-10-CM | POA: Diagnosis not present

## 2015-02-09 DIAGNOSIS — E1122 Type 2 diabetes mellitus with diabetic chronic kidney disease: Secondary | ICD-10-CM | POA: Insufficient documentation

## 2015-02-09 DIAGNOSIS — Z7982 Long term (current) use of aspirin: Secondary | ICD-10-CM | POA: Diagnosis not present

## 2015-02-09 DIAGNOSIS — I69354 Hemiplegia and hemiparesis following cerebral infarction affecting left non-dominant side: Secondary | ICD-10-CM | POA: Diagnosis not present

## 2015-02-09 DIAGNOSIS — Z794 Long term (current) use of insulin: Secondary | ICD-10-CM | POA: Diagnosis not present

## 2015-02-09 DIAGNOSIS — F419 Anxiety disorder, unspecified: Secondary | ICD-10-CM | POA: Diagnosis not present

## 2015-02-09 DIAGNOSIS — Z7902 Long term (current) use of antithrombotics/antiplatelets: Secondary | ICD-10-CM | POA: Diagnosis not present

## 2015-02-09 DIAGNOSIS — R011 Cardiac murmur, unspecified: Secondary | ICD-10-CM | POA: Diagnosis not present

## 2015-02-09 DIAGNOSIS — I129 Hypertensive chronic kidney disease with stage 1 through stage 4 chronic kidney disease, or unspecified chronic kidney disease: Secondary | ICD-10-CM | POA: Insufficient documentation

## 2015-02-09 DIAGNOSIS — G4730 Sleep apnea, unspecified: Secondary | ICD-10-CM | POA: Insufficient documentation

## 2015-02-09 DIAGNOSIS — K219 Gastro-esophageal reflux disease without esophagitis: Secondary | ICD-10-CM | POA: Diagnosis not present

## 2015-02-09 DIAGNOSIS — I6523 Occlusion and stenosis of bilateral carotid arteries: Secondary | ICD-10-CM | POA: Insufficient documentation

## 2015-02-09 DIAGNOSIS — E780 Pure hypercholesterolemia: Secondary | ICD-10-CM | POA: Diagnosis not present

## 2015-02-09 DIAGNOSIS — E1159 Type 2 diabetes mellitus with other circulatory complications: Secondary | ICD-10-CM | POA: Insufficient documentation

## 2015-02-09 DIAGNOSIS — I771 Stricture of artery: Secondary | ICD-10-CM

## 2015-02-09 LAB — CBC
HCT: 40.1 % (ref 36.0–46.0)
HEMOGLOBIN: 13.7 g/dL (ref 12.0–15.0)
MCH: 29.9 pg (ref 26.0–34.0)
MCHC: 34.2 g/dL (ref 30.0–36.0)
MCV: 87.6 fL (ref 78.0–100.0)
Platelets: 228 10*3/uL (ref 150–400)
RBC: 4.58 MIL/uL (ref 3.87–5.11)
RDW: 13.7 % (ref 11.5–15.5)
WBC: 8.7 10*3/uL (ref 4.0–10.5)

## 2015-02-09 LAB — BASIC METABOLIC PANEL
Anion gap: 8 (ref 5–15)
BUN: 10 mg/dL (ref 6–20)
CO2: 25 mmol/L (ref 22–32)
CREATININE: 0.55 mg/dL (ref 0.44–1.00)
Calcium: 9.7 mg/dL (ref 8.9–10.3)
Chloride: 103 mmol/L (ref 101–111)
GFR calc non Af Amer: >60 mL/min (ref >60)
Glucose, Bld: 120 mg/dL — ABNORMAL HIGH (ref 65–99)
POTASSIUM: 3.5 mmol/L (ref 3.5–5.1)
SODIUM: 136 mmol/L (ref 135–145)

## 2015-02-09 LAB — APTT: APTT: 30 s (ref 24–37)

## 2015-02-09 LAB — GLUCOSE, CAPILLARY: Glucose-Capillary: 130 mg/dL — ABNORMAL HIGH (ref 65–99)

## 2015-02-09 LAB — PROTIME-INR
INR: 0.96 (ref 0.00–1.49)
PROTHROMBIN TIME: 13 s (ref 11.6–15.2)

## 2015-02-09 MED ORDER — FENTANYL CITRATE (PF) 100 MCG/2ML IJ SOLN
INTRAMUSCULAR | Status: AC | PRN
  Administered 2015-02-09: 25 ug via INTRAVENOUS

## 2015-02-09 MED ORDER — LIDOCAINE HCL 1 % IJ SOLN
INTRAMUSCULAR | Status: AC
  Filled 2015-02-09: qty 20

## 2015-02-09 MED ORDER — DIPHENHYDRAMINE HCL 50 MG/ML IJ SOLN
50.0000 mg | Freq: Once | INTRAMUSCULAR | Status: AC
  Administered 2015-02-09: 50 mg via INTRAVENOUS

## 2015-02-09 MED ORDER — SODIUM CHLORIDE 0.9 % IV SOLN: INTRAVENOUS | Status: AC

## 2015-02-09 MED ORDER — HEPARIN SOD (PORK) LOCK FLUSH 100 UNIT/ML IV SOLN
INTRAVENOUS | Status: AC
  Filled 2015-02-09: qty 30

## 2015-02-09 MED ORDER — IOHEXOL 300 MG/ML  SOLN
150.0000 mL | Freq: Once | INTRAMUSCULAR | Status: AC | PRN
  Administered 2015-02-09: 60 mL via INTRA_ARTERIAL

## 2015-02-09 MED ORDER — MIDAZOLAM HCL 2 MG/2ML IJ SOLN
INTRAMUSCULAR | Status: AC
  Filled 2015-02-09: qty 2

## 2015-02-09 MED ORDER — SODIUM CHLORIDE 0.9 % IV SOLN
Freq: Once | INTRAVENOUS | Status: AC
  Administered 2015-02-09: 08:00:00 via INTRAVENOUS

## 2015-02-09 MED ORDER — DIPHENHYDRAMINE HCL 50 MG/ML IJ SOLN
INTRAMUSCULAR | Status: AC
  Administered 2015-02-09: 50 mg via INTRAVENOUS
  Filled 2015-02-09: qty 1

## 2015-02-09 MED ORDER — OXYCODONE-ACETAMINOPHEN 5-325 MG PO TABS
ORAL_TABLET | ORAL | Status: DC
Start: 2015-02-09 — End: 2015-02-10
  Filled 2015-02-09: qty 2

## 2015-02-09 MED ORDER — OXYCODONE-ACETAMINOPHEN 5-325 MG PO TABS
2.0000 | ORAL_TABLET | ORAL | Status: DC | PRN
  Administered 2015-02-09: 2 via ORAL

## 2015-02-09 MED ORDER — FENTANYL CITRATE (PF) 100 MCG/2ML IJ SOLN
INTRAMUSCULAR | Status: AC
  Filled 2015-02-09: qty 2

## 2015-02-09 MED ORDER — HEPARIN SOD (PORK) LOCK FLUSH 100 UNIT/ML IV SOLN
INTRAVENOUS | Status: AC | PRN
  Administered 2015-02-09: 1000 [IU] via INTRAVENOUS

## 2015-02-09 MED ORDER — METHYLPREDNISOLONE SODIUM SUCC 125 MG IJ SOLR
INTRAMUSCULAR | Status: AC
  Filled 2015-02-09: qty 2

## 2015-02-09 NOTE — Procedures (Signed)
S/p Bilateral common carotid artery arteriograms,rt vert arteriogram. Rt CFA approach. Findings . 1.Approx 50 to 60 % stenosis at site of angioplasty of  Lt ICa supraclinoid seg. 2.Occluded Rt ICA supraclinoid seg

## 2015-02-09 NOTE — Sedation Documentation (Signed)
Patient denies pain and is resting comfortably.  

## 2015-02-09 NOTE — H&P (Signed)
Chief Complaint: Patient was seen in consultation today for L ICA stenosis at the request of Deveshwar,Sanjeev  Referring Physician(s): Nat Math MD  History of Present Illness: Jill [name] is a 49 y.o. female   L Internal carotid artery angioplasty 10/14/14 Doing well Pt has no complaints Rt thigh numbness has resolved Denies visual or speech difficulties Denies headaches Scheduled today for follow up arteriogram  Denies dye allergy Last arteriogram pt was pre treated with Benadryl---had no reactions Feels might really be an intolerance to Dilaudid  Past Medical History  Diagnosis Date  . Hypertension   . Diabetes mellitus   . 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 Hypercholesterolemia   . Fibroids   . Heart murmur   . Sleep apnea     CPAP q night , last study > 3 yrs. ago  . Seizures     04/2010  . Anxiety   . Chronic kidney disease     appt. /w urology 11/10/2014- for a cyst seen on [place]  . GERD (gastroesophageal reflux disease)   . Back pain     complains when laying on hard flat surface  . History of stress test     referred for cardiac cath- done here at [facility]- 09/2014    Past Surgical History  Procedure Laterality Date  . Cholecystectomy    . Cesarean section  [place]    x2  . Left heart catheterization with coronary angiogram N/A 09/28/2014    Procedure: LEFT HEART CATHETERIZATION WITH CORONARY ANGIOGRAM;  Surgeon: Jettie Booze, MD;  Location: [facility] CATH LAB;  Service: Cardiovascular;  Laterality: N/A;  . Cardiac catheterization    . Appendectomy    . Tubal ligation    . Radiology with anesthesia N/A 10/14/2014    Procedure: ANGIOPLASTY;  Surgeon: Luanne Bras, MD;  Location: [place];  Service: Radiology;  Laterality: N/A;    Allergies: Dilaudid; Contrast media; Crab; and Other  Medications: Prior to Admission medications   Medication Sig Start Date End Date Taking? Authorizing Provider  acetaminophen (TYLENOL)  500 MG tablet Take 1,000 mg by mouth every 8 (eight) hours as needed. For pain   Yes Historical Provider, MD  aspirin EC 81 MG tablet Take 81 mg by mouth every morning.   Yes Historical Provider, MD  clopidogrel (PLAVIX) 75 MG tablet Take 75 mg by mouth every morning.   Yes Historical Provider, MD  docusate sodium (COLACE) 100 MG capsule Take 100 mg by mouth daily.   Yes Historical Provider, MD  felodipine (PLENDIL) 5 MG 24 hr tablet Take 5 mg by mouth daily before breakfast.    Yes Historical Provider, MD  furosemide (LASIX) 80 MG tablet Take 0.5 tablets (40 mg total) by mouth daily. 09/29/14  Yes Jettie Booze, MD  insulin aspart (NOVOLOG) 100 UNIT/ML injection Inject 20 Units into the skin at bedtime.    Yes Historical Provider, MD  lamoTRIgine (LAMICTAL) 100 MG tablet Take 100 mg by mouth every morning.    Yes Historical Provider, MD  levETIRAcetam (KEPPRA) 500 MG tablet Take 250 mg by mouth every 12 (twelve) hours.   Yes Historical Provider, MD  LORazepam (ATIVAN) 1 MG tablet Take 1 mg by mouth daily as needed for anxiety.   Yes Historical Provider, MD  metFORMIN (GLUCOPHAGE) 1000 MG tablet Take 1 tablet (1,000 mg total) by mouth 2 (two) times daily with a meal. 09/30/14  Yes Charlann Lange  Irish Lack, MD  Multiple Vitamin (MULITIVITAMIN WITH MINERALS) TABS Take 1 tablet by mouth every morning.    Yes Historical Provider, MD  nicotine (NICODERM CQ - DOSED IN MG/24 HOURS) 21 mg/24hr patch Place 21 mg onto the skin daily.   Yes Historical Provider, MD  olmesartan (BENICAR) 40 MG tablet Take 40 mg by mouth every morning.    Yes Historical Provider, MD  pioglitazone (ACTOS) 15 MG tablet Take 15 mg by mouth every morning.    Yes Historical Provider, MD  potassium chloride SA (K-DUR,KLOR-CON) 20 MEQ tablet Take 40 mEq by mouth daily.    Yes Historical Provider, MD  ranitidine (ZANTAC) 150 MG tablet Take 1 tablet (150 mg total) by mouth 2 (two) times daily. 11/24/13  Yes [name], MD  rosuvastatin  (CRESTOR) 20 MG tablet Take 20 mg by mouth every morning.    Yes Historical Provider, MD  TOUJEO SOLOSTAR 300 UNIT/ML SOPN Take 60 Units by mouth at bedtime.  10/22/14  Yes Historical Provider, MD     Family History  Problem Relation Age of Onset  . Kidney disease Mother     dialysis for 26 years  . Heart failure Father   . Stroke Paternal Grandmother   . Heart disease Father   . Heart attack Mother     10's  . Heart attack Father     74's    History   Social History  . Marital Status: Married    Spouse Name: N/A  . Number of Children: N/A  . Years of Education: N/A   Social History Main Topics  . Smoking status: Current Some Day Smoker -- 0.25 packs/day for 20 years    Types: Cigarettes    Start date: 07/25/1975  . Smokeless tobacco: Never Used  . Alcohol Use: No     Comment: rare use   . Drug Use: No  . Sexual Activity: Yes    Birth Control/ Protection: Other-see comments, Surgical     Comment: Tubal Ligation   Other Topics Concern  . None   Social History Narrative     Review of Systems: A 12 point ROS discussed and pertinent positives are indicated in the HPI above.  All other systems are negative.  Review of Systems  Constitutional: Negative for fever, activity change and fatigue.  HENT: Negative for hearing loss and tinnitus.   Respiratory: Negative for cough and shortness of breath.   Cardiovascular: Negative for chest pain.  Gastrointestinal: Negative for abdominal pain.  Neurological: Negative for dizziness, tremors, seizures, syncope, facial asymmetry, speech difficulty, weakness, light-headedness, numbness and headaches.  Psychiatric/Behavioral: Negative for behavioral problems and confusion.    Vital Signs: BP 169/72 mmHg  Pulse 73  Temp(Src) 97.8 F (36.6 C)  Resp 18  Ht 5\' 6"  (1.676 m)  Wt 305 lb (138.347 kg)  BMI 49.25 kg/m2  SpO2 100%  Physical Exam  Constitutional: She is oriented to person, place, and time. She appears  well-nourished.  Eyes: EOM are normal.  Neck: Neck supple.  Cardiovascular: Normal rate, regular rhythm and normal heart sounds.   No murmur heard. Pulmonary/Chest: Effort normal and breath sounds normal. She has no wheezes.  Abdominal: Soft. Bowel sounds are normal. There is no tenderness.  Musculoskeletal: Normal range of motion.  Neurological: She is alert and oriented to person, place, and time.  Skin: Skin is warm and dry.  Psychiatric: She has a normal mood and affect. Her behavior is normal. Judgment and thought content normal.  Nursing note and vitals reviewed.   Mallampati Score:  MD Evaluation Airway: WNL Heart: WNL Abdomen: WNL Chest/ Lungs: WNL ASA  Classification: 2 Mallampati/Airway Score: Two  Imaging: No results found.  Labs:  CBC:  Recent Labs  10/09/14 1433 10/15/14 0930 10/30/14 1454 02/09/15 0739  WBC 9.6 11.9* 8.5 8.7  HGB 13.7 12.8 13.2 13.7  HCT 40.9 39.4 39.8 40.1  PLT 263 232 232 228    COAGS:  Recent Labs  09/25/14 0949 10/09/14 1433 02/09/15 0739  INR 0.97 0.98 0.96  APTT  --  31 30    BMP:  Recent Labs  10/09/14 1433  10/14/14 1111 10/15/14 0930 10/30/14 1454 02/09/15 0739  NA 139  --   --  136 138 136  K 4.0  --   --  4.0 3.8 3.5  CL 103  --   --  107 104 103  CO2 24  --   --  21 24 25   GLUCOSE 230*  < > 203* 281* 171* 120*  BUN 10  --   --  12 18 10   CALCIUM 10.1  --   --  9.4 10.3 9.7  CREATININE 0.60  --   --  0.60 0.68 0.55  GFRNONAA >90  --   --  >90 >90 >60  GFRAA >90  --   --  >90 >90 >60  < > = values in this interval not displayed.  LIVER FUNCTION TESTS:  Recent Labs  10/09/14 1433  BILITOT 0.8  AST 23  ALT 23  ALKPHOS 73  PROT 7.4  ALBUMIN 3.9    TUMOR MARKERS: No results for input(s): AFPTM, CEA, CA199, CHROMGRNA in the last 8760 hours.  Assessment and Plan:  L ICA stenosis; angioplasty 10/14/14 Doing well No complaints For follow up arteriogram today Risks and Benefits discussed  with the patient including, but not limited to bleeding, infection, vascular injury, contrast induced renal failure, stroke or even death. All of the patient's questions were answered, patient is agreeable to proceed. Consent signed and in chart.  Thank you for this interesting consult.  I greatly enjoyed meeting Jill [name] and look forward to participating in their care.  A copy of this report was sent to the requesting provider on this date.  Signed: Kerrie Latour A 02/09/2015, 8:16 AM   I spent a total of  30 Minutes   in face to face in clinical consultation, greater than 50% of which was counseling/coordinating care for cerebral arteriogram

## 2015-02-09 NOTE — Sedation Documentation (Signed)
O2 nasal cannula removed temporarily per MD for further, clearer images. Pt is sating 99% on RA. Does not appear to be in any distress at this time.

## 2015-02-09 NOTE — Progress Notes (Signed)
In and out cath with #59f catheter; 800cc clear yellow urine and tolerated well

## 2015-02-09 NOTE — Discharge Instructions (Signed)
Arteriogram Care After These instructions give you information on caring for yourself after your procedure. Your doctor may also give you more specific instructions. Call your doctor if you have any problems or questions after your procedure.  HOME CARE  Do not bathe, swim, or use a hot tub until directed by your doctor. You can shower.  Do not lift anything heavier than 10 pounds (about a gallon of milk) for 2 days.  Do not walk a lot, run, or drive for 2 days.  Return to normal activities in 2 days or as told by your doctor. Finding out the results of your test Ask when your test results will be ready. Make sure you get your test results. GET HELP RIGHT AWAY IF:   You have fever.  You have more pain in your leg.  The leg that was cut is:  Bleeding.  Puffy (swollen) or red.  Cold.  Pale or changes color.  Weak.  Tingly or numb. If you go to the Emergency Room, tell your nurse that you have had an arteriogram. Take this paper with you to show the nurse. MAKE SURE YOU:  Understand these instructions.  Will watch your condition.  Will get help right away if you are not doing well or get worse. Document Released: 10/06/2008 Document Revised: 07/15/2013 Document Reviewed: 10/06/2008 [facility] Patient Information 2015 [place], [place]. This information is not intended to replace advice given to you by your health care provider. Make sure you discuss any questions you have with your health care provider.  NO METFORMIN FOR 2 DAYS

## 2015-02-09 NOTE — Progress Notes (Signed)
Per Gretchen Short notified of client dye allergy and no new orders noted

## 2015-03-01 DIAGNOSIS — Z713 Dietary counseling and surveillance: Secondary | ICD-10-CM | POA: Diagnosis not present

## 2015-04-05 DIAGNOSIS — Z23 Encounter for immunization: Secondary | ICD-10-CM | POA: Diagnosis not present

## 2015-04-09 DIAGNOSIS — Z23 Encounter for immunization: Secondary | ICD-10-CM | POA: Diagnosis not present

## 2015-04-30 ENCOUNTER — Ambulatory Visit: Payer: BLUE CROSS/BLUE SHIELD | Admitting: Cardiology

## 2015-05-21 ENCOUNTER — Ambulatory Visit (INDEPENDENT_AMBULATORY_CARE_PROVIDER_SITE_OTHER): Payer: BLUE CROSS/BLUE SHIELD | Admitting: Cardiology

## 2015-05-21 ENCOUNTER — Encounter: Payer: Self-pay | Admitting: Cardiology

## 2015-05-21 VITALS — BP 128/79 | HR 82 | Ht 66.0 in | Wt 314.0 lb

## 2015-05-21 DIAGNOSIS — R9439 Abnormal result of other cardiovascular function study: Secondary | ICD-10-CM

## 2015-05-21 DIAGNOSIS — R072 Precordial pain: Secondary | ICD-10-CM

## 2015-05-21 DIAGNOSIS — I10 Essential (primary) hypertension: Secondary | ICD-10-CM

## 2015-05-21 DIAGNOSIS — E785 Hyperlipidemia, unspecified: Secondary | ICD-10-CM

## 2015-05-21 DIAGNOSIS — I639 Cerebral infarction, unspecified: Secondary | ICD-10-CM

## 2015-05-21 NOTE — Patient Instructions (Signed)
Continue all current medications. Your physician wants you to follow up in: 6 months.  You will receive a reminder letter in the mail one-two months in advance.  If you don't receive a letter, please call our office to schedule the follow up appointment   

## 2015-05-21 NOTE — Progress Notes (Signed)
Patient ID: Jill [name], female   DOB: Dec 17, 1965, 49 y.o.   MRN: [id number]     Clinical Summary Ms. Cina is a 49 y.o.female seen today for follow up of the following medical problems.   1. Abnormal stress test/Chest pain - symptoms have historically been most consistent with GI etiology. Often coming on after meals, especially after spicy foods. Better with alka seltzer, improved with zantac. - as part of a bariatric surgery preop workup she had a nuclear stress that was abnormal - follow up cath 09/2014 showed mild to moderate non-obstructive disease.   - no recent symptoms, remains symptom free since starting antacid   2. Obesity - undergoing evaluation for bariatric surgery   3. Hyperlipidemia  - followed by PCP, on crestor 20mg  daily.  - no recent panel in our system  4. HTN  - compliant with meds  - checks at home occasionally, typically around 120/70s.   5. PAD - prior right lower intervention with PTA and stenting of right common iliac and right SFA in Jan 2010 - denies any claudication  6. OSA - compliant with CPAP  7. Tobacco - continues to smoke, working to stop  8. Carotid stenosis - followed by vascular. History of left ICA angioplasty 09/2014 for which she remains on ASA and plavix for.    Past Medical History  Diagnosis Date  . Hypertension   . Diabetes mellitus   . 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 Hypercholesterolemia   . Fibroids   . Heart murmur   . Sleep apnea     CPAP q night , last study > 3 yrs. ago  . Seizures     04/2010  . Anxiety   . Chronic kidney disease     appt. /w urology 11/10/2014- for a cyst seen on [place]  . GERD (gastroesophageal reflux disease)   . Back pain     complains when laying on hard flat surface  . History of stress test     referred for cardiac cath- done here at [facility]- 09/2014     Allergies  Allergen Reactions  . Dilaudid [Hydromorphone Hcl] Nausea And Vomiting  . Contrast  Media [Iodinated Diagnostic Agents] Nausea And Vomiting    "contrast dye vs dilaudid gave pt N&V with last procedure"  . Crab [Shellfish Allergy] Swelling  . Other Other (See Comments)    "pt is a difficult IV stick, prefers IV team paged for IV starts"     Current Outpatient Prescriptions  Medication Sig Dispense Refill  . acetaminophen (TYLENOL) 500 MG tablet Take 1,000 mg by mouth every 8 (eight) hours as needed. For pain    . aspirin EC 81 MG tablet Take 81 mg by mouth every morning.    . clopidogrel (PLAVIX) 75 MG tablet Take 75 mg by mouth every morning.    . docusate sodium (COLACE) 100 MG capsule Take 100 mg by mouth daily.    . felodipine (PLENDIL) 5 MG 24 hr tablet Take 5 mg by mouth daily before breakfast.     . furosemide (LASIX) 80 MG tablet Take 0.5 tablets (40 mg total) by mouth daily.    . insulin aspart (NOVOLOG) 100 UNIT/ML injection Inject 20 Units into the skin at bedtime.     . lamoTRIgine (LAMICTAL) 100 MG tablet Take 100 mg by mouth every morning.     . levETIRAcetam (KEPPRA) 500 MG tablet Take 250 mg by mouth every 12 (  twelve) hours.    Marland Kitchen LORazepam (ATIVAN) 1 MG tablet Take 1 mg by mouth daily as needed for anxiety.    . metFORMIN (GLUCOPHAGE) 1000 MG tablet Take 1 tablet (1,000 mg total) by mouth 2 (two) times daily with a meal.    . Multiple Vitamin (MULITIVITAMIN WITH MINERALS) TABS Take 1 tablet by mouth every morning.     . nicotine (NICODERM CQ - DOSED IN MG/24 HOURS) 21 mg/24hr patch Place 21 mg onto the skin daily.    Marland Kitchen olmesartan (BENICAR) 40 MG tablet Take 40 mg by mouth every morning.     . pioglitazone (ACTOS) 15 MG tablet Take 15 mg by mouth every morning.     . potassium chloride SA (K-DUR,KLOR-CON) 20 MEQ tablet Take 40 mEq by mouth daily.     . ranitidine (ZANTAC) 150 MG tablet Take 1 tablet (150 mg total) by mouth 2 (two) times daily.    . rosuvastatin (CRESTOR) 20 MG tablet Take 20 mg by mouth every morning.     Marland Kitchen TOUJEO SOLOSTAR 300 UNIT/ML SOPN  Take 60 Units by mouth at bedtime.   0   No current facility-administered medications for this visit.     Past Surgical History  Procedure Laterality Date  . Cholecystectomy    . Cesarean section  [place]    x2  . Left heart catheterization with coronary angiogram N/A 09/28/2014    Procedure: LEFT HEART CATHETERIZATION WITH CORONARY ANGIOGRAM;  Surgeon: Jettie Booze, MD;  Location: [facility] CATH LAB;  Service: Cardiovascular;  Laterality: N/A;  . Cardiac catheterization    . Appendectomy    . Tubal ligation    . Radiology with anesthesia N/A 10/14/2014    Procedure: ANGIOPLASTY;  Surgeon: Luanne Bras, MD;  Location: [place];  Service: Radiology;  Laterality: N/A;     Allergies  Allergen Reactions  . Dilaudid [Hydromorphone Hcl] Nausea And Vomiting  . Contrast Media [Iodinated Diagnostic Agents] Nausea And Vomiting    "contrast dye vs dilaudid gave pt N&V with last procedure"  . Crab [Shellfish Allergy] Swelling  . Other Other (See Comments)    "pt is a difficult IV stick, prefers IV team paged for IV starts"      Family History  Problem Relation Age of Onset  . Kidney disease Mother     dialysis for 26 years  . Heart failure Father   . Stroke Paternal Grandmother   . Heart disease Father   . Heart attack Mother     61's  . Heart attack Father     51's     Social History Ms. Corporan reports that she has been smoking Cigarettes.  She started smoking about 39 years ago. She has a 5 pack-year smoking history. She has never used smokeless tobacco. Ms. Paulsen reports that she does not drink alcohol.   Review of Systems CONSTITUTIONAL: No weight loss, fever, chills, weakness or fatigue.  HEENT: Eyes: No visual loss, blurred vision, double vision or yellow sclerae.No hearing loss, sneezing, congestion, runny nose or sore throat.  SKIN: No rash or itching.  CARDIOVASCULAR: per hpi RESPIRATORY: No shortness of breath, cough or sputum.  GASTROINTESTINAL: No  anorexia, nausea, vomiting or diarrhea. No abdominal pain or blood.  GENITOURINARY: No burning on urination, no polyuria NEUROLOGICAL: No headache, dizziness, syncope, paralysis, ataxia, numbness or tingling in the extremities. No change in bowel or bladder control.  MUSCULOSKELETAL: No muscle, back pain, joint pain or stiffness.  LYMPHATICS: No enlarged nodes.  No history of splenectomy.  PSYCHIATRIC: No history of depression or anxiety.  ENDOCRINOLOGIC: No reports of sweating, cold or heat intolerance. No polyuria or polydipsia.  Marland Kitchen   Physical Examination Filed Vitals:   05/21/15 1536  BP: 128/79  Pulse: 82   Filed Vitals:   05/21/15 1536  Height: 5\' 6"  (1.676 m)  Weight: 314 lb (142.429 kg)    Gen: resting comfortably, no acute distress HEENT: no scleral icterus, pupils equal round and reactive, no palptable cervical adenopathy,  CV: RRR, no m/r/g, no jvd Resp: Clear to auscultation bilaterally GI: abdomen is soft, non-tender, non-distended, normal bowel sounds, no hepatosplenomegaly MSK: extremities are warm, no edema.  Skin: warm, no rash Neuro:  no focal deficits Psych: appropriate affect   Diagnostic Studies 08/2014 Lexiscan MPI IMPRESSION: 1. Large area of ischemia extending from the basal to distal anteroseptal wall.  2. Normal left ventricular wall motion.  3. Left ventricular ejection fraction 60 for%  4. High-risk stress test findings*.  09/2014 Cath HEMODYNAMICS: Aortic pressure was 142/66; LV pressure was 144/7; LVEDP 18. There was no gradient between the left ventricle and aorta.   ANGIOGRAPHIC DATA: The left main coronary artery is widely patent.  The left anterior descending artery is a large vessel which wraps around the apex. There is mild atherosclerosis in the mid vessel. There is a large diagonal vessel which arises proximally. There are several small diagonals which are patent.  The left circumflex artery is a large dominant vessel. There  is a ramus vessel which is large and widely patent. There is mild disease in the circumflex system. The first obtuse marginal is small but patent. There is a small left PDA which is patent.  The right coronary artery is a nondominant vessel. In the mid vessel, there is moderate disease.  LEFT VENTRICULOGRAM: Left ventricular angiogram was not done due to the question of a dye allergy. LVEDP was 18 mmHg.  IMPRESSIONS:  1. Normal left main coronary artery. 2. Mild disease in the left anterior descending artery and its branches. 3. Mild disease in the dominant left circumflex artery and its branches. 4. Moderate disease in the nondominant right coronary artery. 5. Left ventricular systolic function not assessed. LVEDP 18 mmHg. 6. False positive nuclear stress test.  RECOMMENDATION: Continue aggressive preventive therapy given her history of peripheral vascular disease and multiple risk factors for coronary atherosclerosis. She will follow-up with Dr. Harl Bowie.    Assessment and Plan   1. Abnormal stress test/Chest pain - chest pain symptoms most consistent with GI source, better with H2 blocker. No other chest pain symptoms since starting zantac - negative cath 09/2014 - continue risk factor modification  2. Hyperlipidemia - continue high dose statin - will repeat lipid panel  3. HTN - at goal, continue current meds  4. PAD - prior intervention, denies any recent symptoms  5. OSA - continue CPAP  6. Tobacco abuse - advised to quit.   7. Hx of CVA - known cerebral vascular disease, s/p carotid angiogplasty.  - if considered for bariatric surgery decision on ASA and plavix would be up to interventional neuro.    F/u 6 months  [name], M.D.

## 2015-07-21 ENCOUNTER — Other Ambulatory Visit (HOSPITAL_COMMUNITY): Payer: Self-pay | Admitting: Family Medicine

## 2015-07-21 DIAGNOSIS — Z1231 Encounter for screening mammogram for malignant neoplasm of breast: Secondary | ICD-10-CM

## 2015-07-28 ENCOUNTER — Other Ambulatory Visit (HOSPITAL_COMMUNITY): Payer: Self-pay | Admitting: Interventional Radiology

## 2015-07-28 DIAGNOSIS — I771 Stricture of artery: Secondary | ICD-10-CM

## 2015-07-29 IMAGING — XA IR PTA INTRACRANIAL
1 series · 10 of 24 positions shown · IV contrast (IODINE)
Comparison: none

CLINICAL DATA: Progressively occlusive left internal carotid artery
supraclinoid segment 75-80% stenosis compared to the previous
arteriogram. Interval occlusion of the right internal carotid
artery, with right cerebral hemispheric ischemic stroke.

[Series 300: sp pta intracranial · 10 of 220 slices shown]
[im 10/220]
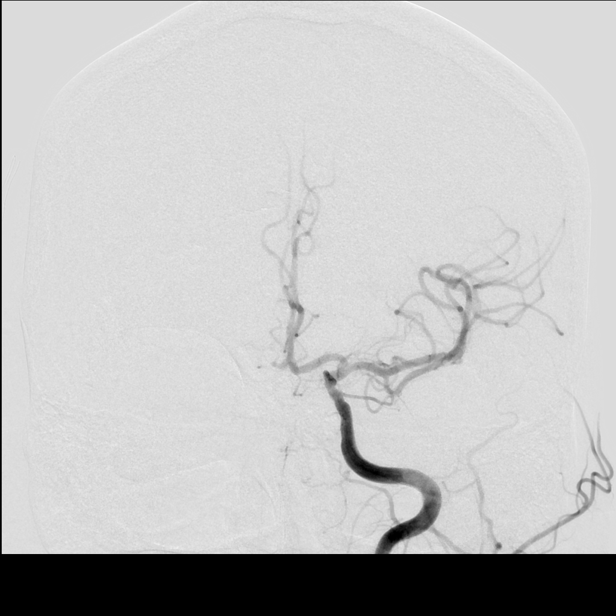
[im 29/220]
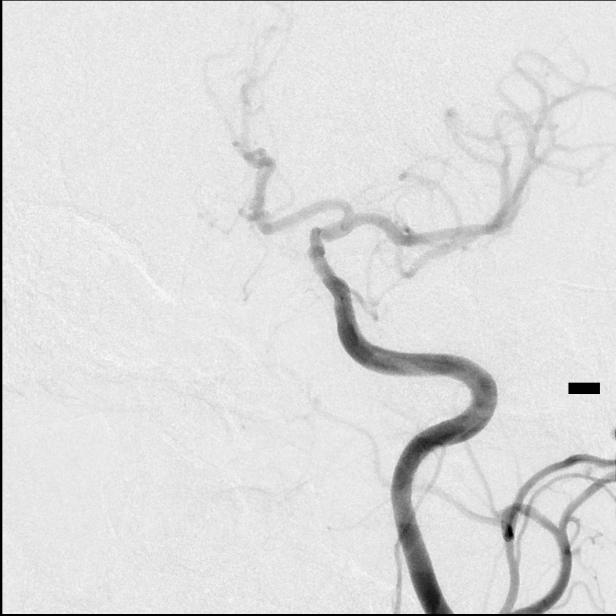
[im 58/220]
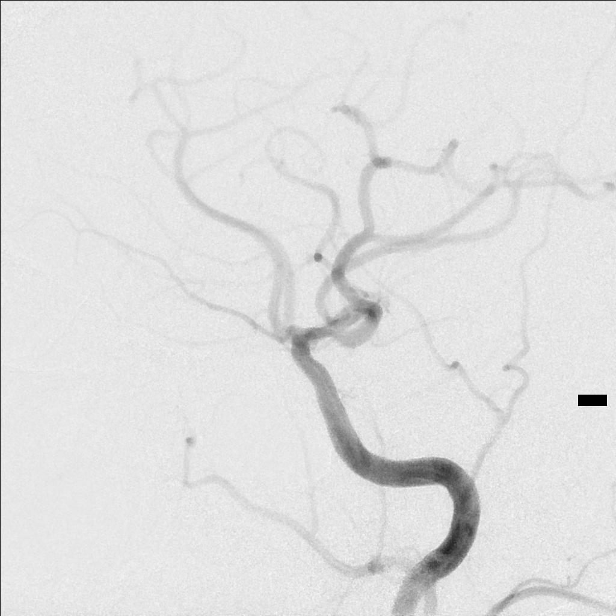
[im 77/220]
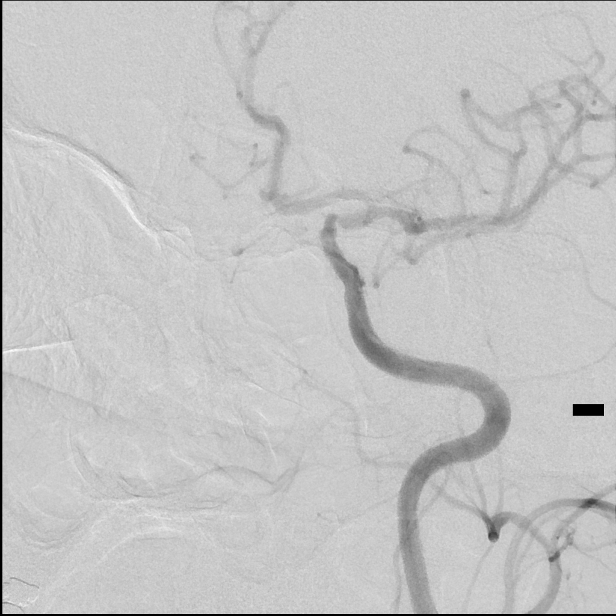
[im 96/220]
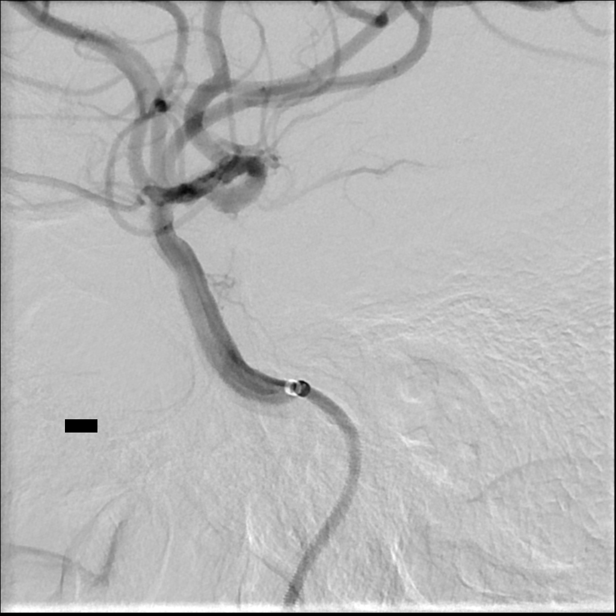
[im 124/220]
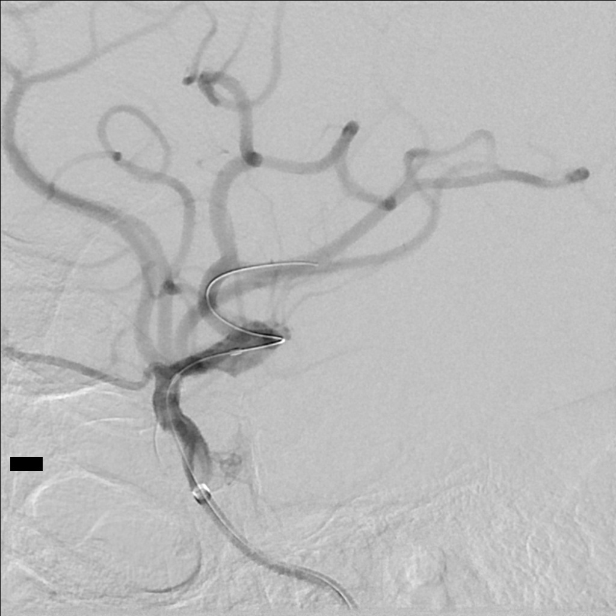
[im 143/220]
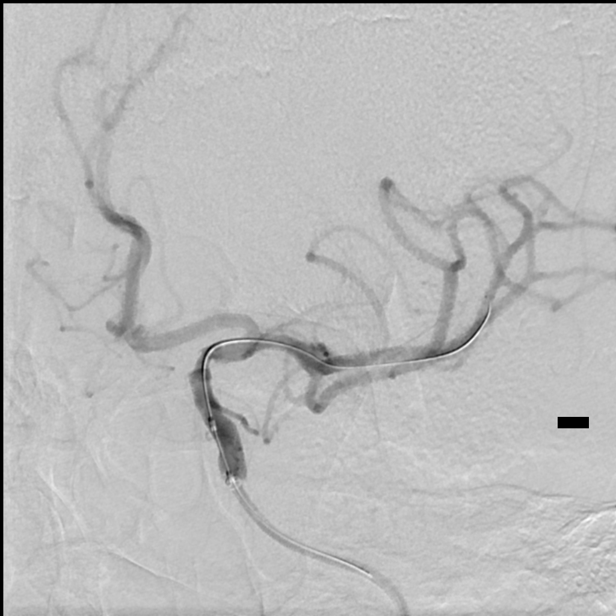
[im 172/220]
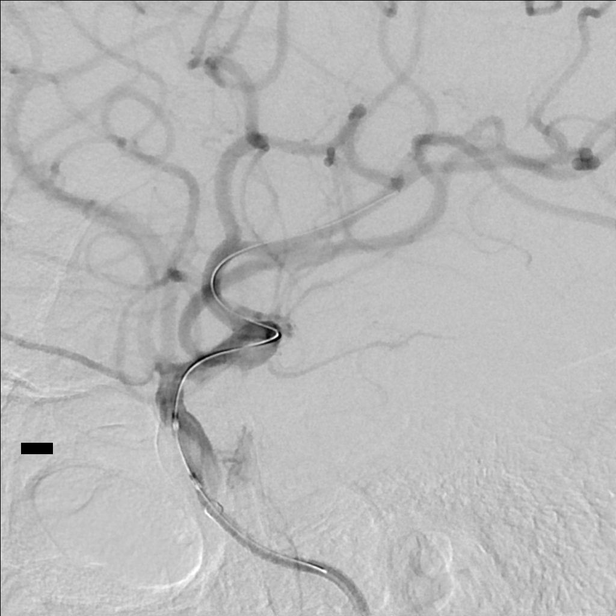
[im 191/220]
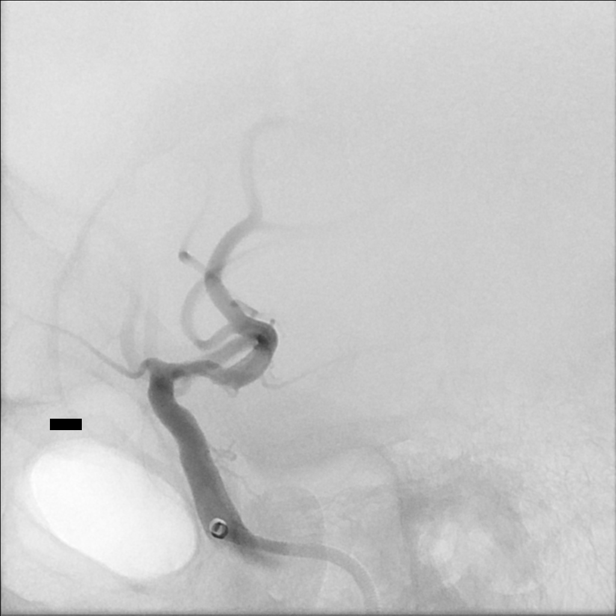
[im 210/220]
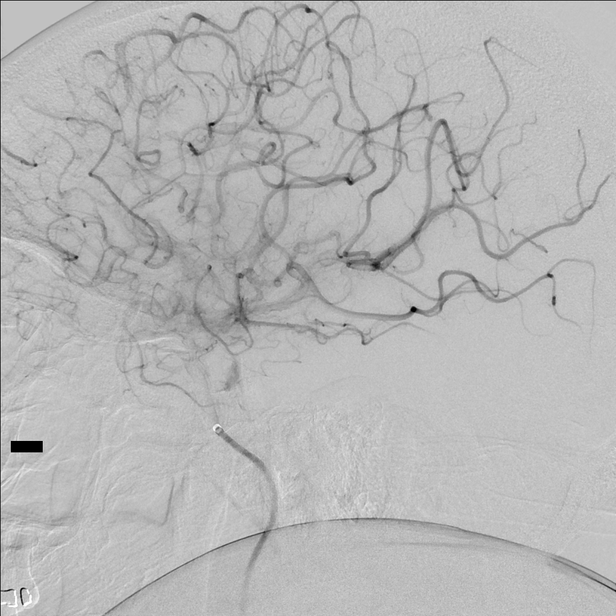

[10 of 24 positions shown; findings below may reference images not displayed]

EXAM:
PTA INTRACRANIAL

ANESTHESIA/SEDATION:
General anesthesia.

MEDICATIONS:
As per general anesthesia.

CONTRAST:  60 OMNIPAQUE IOHEXOL 300 MG/ML  SOLN

PROCEDURE:
Right internal carotid arteriogram followed by endovascular balloon
angioplasty of left internal carotid artery supraclinoid segment of
75% to 80%.

Following a full explanation of the procedure along with the
potential associated complications, an informed witnessed consent
was obtained.

The patient was put under general anesthesia by the [REDACTED] at [HOSPITAL]. The right groin was prepped
and draped in the usual sterile fashion. Thereafter using modified
Seldinger technique, transfemoral access into the right common
femoral artery was obtained without difficulty. Over a 0.035 inch
guidewire, a 5 French Pinnacle sheath was inserted. Through this,
and also over a 0.035 inch guidewire, a 5 French JB1 catheter was
advanced to the aortic arch region and selectively positioned in the
left common carotid artery.

An arteriogram was then perfor[REDACTED]ed over the carotid
bifurcation and intracranially.

COMPLICATIONS:
None immediate.
FINDINGS: The left common carotid arteriogram demonstrates the left external
carotid artery and its major branches to be normal.

The left internal carotid artery at the bulb to the cranial skull
base opacifies normally.

The petrous and the proximal cavernous segments are widely patent.

The distal cavernous segment at the supraclinoid segment
demonstrates a severe focal stenosis secondary to a circumferential
arteriosclerotic plaque.

Distal to this the supraclinoid segment is widely patent.

The left middle and the left anterior cerebral arteries opacify
normally into the capillary and venous phases.

Prompt cross opacification via the anterior communicating artery of
the right anterior cerebral artery A2 segment is seen.

ENDOVASCULAR PERCUTANEOUS TRANSLUMINAL ANGIOPLASTY OF INTRACRANIAL
LEFT ICA IN THE SUPRACLINOID SEGMENT.

The diagnostic JB1 catheter in the left common carotid artery was
exchanged over a 0.035 inch 300 cm Rosen exchange guidewire for a 80
cm 6 Martinas Nojus using biplane roadmap technique and
constant fluoroscopic guidance. Good aspiration was obtained from
the side port of the [place] at the hub of the [place] Tiger
Leo. A gentle constant injection demonstrated no evidence of
spasms, dissections or of intraluminal filling defects.

This was then connected to continuous heparinized saline infusion.

Over the Rosen exchange guidewire, a 5 French 125 cm Catalyst guide
catheter was then advanced and positioned just proximal to the left
common carotid bifurcation. The guidewire was removed. Good
aspiration was obtained from the hub of the 5 French guide catheter.
Gentle constant injection demonstrated no evidence of spasms,
dissections or of intraluminal filling defects.

Over a 0.035 inch Roadrunner guidewire, using biplane roadmap
technique and constant fluoroscopic guidance, the 5 French guide
catheter was then advanced to the petrous segment of the left
internal carotid artery. The guidewire was removed. Good aspiration
obtained from the hub of the guide catheter. Again a gentle constant
injection demonstrated no evidence of spasms, dissections or of
intraluminal filling defects.

A control arteriogram performed through the 5 French guide catheter
demonstrated no change in the intracranial circulation.

Again demonstrated was the high-grade stenosis of the supraclinoid
left ICA. Measurements were then performed of the blood vessel
distal and proximal to the tight narrowing.

It was elected to use a 2.5 mm x 9 mm angioplasty balloon catheter.
At this time in a coaxial manner and with constant heparinized
saline infusion using biplane roadmap technique and constant
fluoroscopic guidance, [REDACTED] 2.5 mm x 9 mm balloon angioplasty
micro catheter which had been prepped with 75% contrast and 25%
heparinized saline infusion was then advanced over 0.014 and Softip
Synchro micro guidewire to the distal end of the 5 French guide
catheter in the left internal carotid artery. With the micro
guidewire leading with a J-tip configuration to avoid dissections or
inducing spasm, the combination was navigated without difficulty to
the tight narrowing in the supraclinoid segment. The micro guidewire
was then gently manipulated using a torque device to advance the
combination of the micro guidewire followed by the micro catheter
balloon angioplasty micro catheter. The angioplastied balloon micro
catheter was positioned optimally proximal and just distal to the
tight focal stenosis.

This was then gradually inflated to 2.4 mm over a period of
approximately a minute where it was maintained for about 90 seconds,
using micro inflation syringe device via micro tubing. The balloon
was then deflated and retrieved proximally. A control arteriogram
performed through the 5 French guide catheter and the left internal
carotid artery demonstrated significantly improved caliber and flow
through the angioplastied segment.

A second inflation was performed again as described above with the
balloon diameter being 2.5 atmospheres. Subsequently a third
angioplasty was performed because of mild recoil whereby the balloon
was expanded to 8 atmospheres achieving 2.6 mm in diameter.

Once there it was maintained for approximately 30 seconds. Upon
deflation, and proximal retrieval of the balloon, a control
angiogram performed through the 5 French guide catheter demonstrated
significantly improved caliber and flow through the angioplastied
segment.

Significantly improved flow of contrast was seen in the left middle
and the left anterior cerebral artery distributions.

Also noted was a posterior pituitary blush, a normal variation.

Control arteriograms were then performed at [DATE] and 40 minutes
post balloon angioplasty. These continued to demonstrate excellent
flow through the angioplastied segment, with a residual narrowing of
about less than 20%. The micro guidewire was then gently retrieved
with a balloon micro catheter under constant observation and
removed. A final control arteriogram performed through the 5 French
guide catheter demonstrated excellent flow through the angioplastied
segment and intracranially. No evidence of filling defects,
occlusions, or of intimal flaps was noted.

The 5 French guide catheter and the 6 Martinas Nojus
were then retrieved into the abdominal aorta and exchanged over a
J-tip guidewire for a 6 French Pinnacle sheath. This was then
successfully removed with the application of an external closure
device.

Throughout the procedure the patient's hemodynamic and neurological
status remained stable. The patient's ACT was maintained in the
region of approximately 180 seconds.

The patient's general anesthesia was then reversed and the patient
was extubated. Upon recovery the patient demonstrated no new
neurological signs or symptoms. She did not complain of any
headaches, nausea, vomiting or worsening of her left-sided mild
weakness related to the previous ischemic stroke involving the right
cerebral hemisphere.

The patient was then transported to the neuro ICU to continue on IV
heparin overnight, and also to maintain her blood pressure between
120 and 140 mmHg with Cardene IV.

By late evening the patient was fully alert, awake and eating.
Neurologically she remained stable.

The following day the IV heparin was stopped and the patient was
switched to aspirin 81 mg a day and Plavix 75 mg a day. She was
eating independently without difficulty. She then was ambulated with
which she had no difficulty. She was then discharged under the care
of her family. She was advised to continue taking her aspirin and
Plavix and to refrain from any heavy lifting for 2 weeks. She was
also advised not to drive.

She was advised to maintain hydration. She will return for follow-up
in the clinic in 2 weeks time.
IMPRESSION: Status post percutaneous transluminal balloon angioplasty for
relatively severe stenosis due to atherosclerotic disease of the
left internal carotid artery supraclinoid segment for narrowing of
75-80%, with the right internal carotid artery being occluded.

## 2015-07-30 ENCOUNTER — Other Ambulatory Visit (HOSPITAL_COMMUNITY): Payer: Self-pay | Admitting: Interventional Radiology

## 2015-08-02 ENCOUNTER — Ambulatory Visit (HOSPITAL_COMMUNITY)
Admission: RE | Admit: 2015-08-02 | Discharge: 2015-08-02 | Disposition: A | Discharge status: Home / Self Care | Payer: BLUE CROSS/BLUE SHIELD | Source: Ambulatory Visit | Attending: Family Medicine | Admitting: Family Medicine

## 2015-08-02 ENCOUNTER — Ambulatory Visit (HOSPITAL_COMMUNITY)
Admission: RE | Admit: 2015-08-02 | Discharge: 2015-08-02 | Disposition: A | Discharge status: Home / Self Care | Payer: BLUE CROSS/BLUE SHIELD | Source: Ambulatory Visit | Attending: Interventional Radiology | Admitting: Interventional Radiology

## 2015-08-02 ENCOUNTER — Ambulatory Visit (HOSPITAL_COMMUNITY): Payer: BLUE CROSS/BLUE SHIELD

## 2015-08-02 DIAGNOSIS — I6521 Occlusion and stenosis of right carotid artery: Secondary | ICD-10-CM | POA: Diagnosis not present

## 2015-08-02 DIAGNOSIS — I6782 Cerebral ischemia: Secondary | ICD-10-CM | POA: Diagnosis not present

## 2015-08-02 DIAGNOSIS — Z1231 Encounter for screening mammogram for malignant neoplasm of breast: Secondary | ICD-10-CM | POA: Insufficient documentation

## 2015-08-02 DIAGNOSIS — I6522 Occlusion and stenosis of left carotid artery: Secondary | ICD-10-CM | POA: Insufficient documentation

## 2015-08-02 DIAGNOSIS — I771 Stricture of artery: Secondary | ICD-10-CM | POA: Diagnosis not present

## 2015-08-02 DIAGNOSIS — Z09 Encounter for follow-up examination after completed treatment for conditions other than malignant neoplasm: Secondary | ICD-10-CM | POA: Diagnosis not present

## 2015-08-02 DIAGNOSIS — I6523 Occlusion and stenosis of bilateral carotid arteries: Secondary | ICD-10-CM | POA: Diagnosis not present

## 2015-08-02 LAB — POCT I-STAT CREATININE: CREATININE: 0.5 mg/dL (ref 0.44–1.00)

## 2015-08-02 MED ORDER — IOHEXOL 350 MG/ML SOLN
100.0000 mL | Freq: Once | INTRAVENOUS | Status: AC | PRN
  Administered 2015-08-02: 100 mL via INTRAVENOUS

## 2015-08-05 ENCOUNTER — Encounter (HOSPITAL_COMMUNITY): Payer: Self-pay | Admitting: *Deleted

## 2015-08-05 ENCOUNTER — Emergency Department (HOSPITAL_COMMUNITY): Payer: BLUE CROSS/BLUE SHIELD

## 2015-08-05 ENCOUNTER — Emergency Department (HOSPITAL_COMMUNITY)
Admission: EM | Admit: 2015-08-05 | Discharge: 2015-08-05 | Disposition: A | Discharge status: Home / Self Care | Payer: BLUE CROSS/BLUE SHIELD | Attending: Emergency Medicine | Admitting: Emergency Medicine

## 2015-08-05 DIAGNOSIS — F17210 Nicotine dependence, cigarettes, uncomplicated: Secondary | ICD-10-CM | POA: Insufficient documentation

## 2015-08-05 DIAGNOSIS — Z7902 Long term (current) use of antithrombotics/antiplatelets: Secondary | ICD-10-CM | POA: Diagnosis not present

## 2015-08-05 DIAGNOSIS — I129 Hypertensive chronic kidney disease with stage 1 through stage 4 chronic kidney disease, or unspecified chronic kidney disease: Secondary | ICD-10-CM | POA: Insufficient documentation

## 2015-08-05 DIAGNOSIS — F419 Anxiety disorder, unspecified: Secondary | ICD-10-CM | POA: Insufficient documentation

## 2015-08-05 DIAGNOSIS — Z8673 Personal history of transient ischemic attack (TIA), and cerebral infarction without residual deficits: Secondary | ICD-10-CM | POA: Diagnosis not present

## 2015-08-05 DIAGNOSIS — Z794 Long term (current) use of insulin: Secondary | ICD-10-CM | POA: Insufficient documentation

## 2015-08-05 DIAGNOSIS — Z9981 Dependence on supplemental oxygen: Secondary | ICD-10-CM | POA: Diagnosis not present

## 2015-08-05 DIAGNOSIS — S92352A Displaced fracture of fifth metatarsal bone, left foot, initial encounter for closed fracture: Secondary | ICD-10-CM | POA: Diagnosis not present

## 2015-08-05 DIAGNOSIS — X501XXA Overexertion from prolonged static or awkward postures, initial encounter: Secondary | ICD-10-CM | POA: Diagnosis not present

## 2015-08-05 DIAGNOSIS — S92902A Unspecified fracture of left foot, initial encounter for closed fracture: Secondary | ICD-10-CM

## 2015-08-05 DIAGNOSIS — E7800 Pure hypercholesterolemia, unspecified: Secondary | ICD-10-CM | POA: Insufficient documentation

## 2015-08-05 DIAGNOSIS — M25572 Pain in left ankle and joints of left foot: Secondary | ICD-10-CM | POA: Diagnosis not present

## 2015-08-05 DIAGNOSIS — E119 Type 2 diabetes mellitus without complications: Secondary | ICD-10-CM | POA: Insufficient documentation

## 2015-08-05 DIAGNOSIS — Z86718 Personal history of other venous thrombosis and embolism: Secondary | ICD-10-CM | POA: Insufficient documentation

## 2015-08-05 DIAGNOSIS — Y9301 Activity, walking, marching and hiking: Secondary | ICD-10-CM | POA: Diagnosis not present

## 2015-08-05 DIAGNOSIS — Z86018 Personal history of other benign neoplasm: Secondary | ICD-10-CM | POA: Diagnosis not present

## 2015-08-05 DIAGNOSIS — Z9889 Other specified postprocedural states: Secondary | ICD-10-CM | POA: Diagnosis not present

## 2015-08-05 DIAGNOSIS — Z7982 Long term (current) use of aspirin: Secondary | ICD-10-CM | POA: Insufficient documentation

## 2015-08-05 DIAGNOSIS — N189 Chronic kidney disease, unspecified: Secondary | ICD-10-CM | POA: Insufficient documentation

## 2015-08-05 DIAGNOSIS — Z79899 Other long term (current) drug therapy: Secondary | ICD-10-CM | POA: Diagnosis not present

## 2015-08-05 DIAGNOSIS — G4730 Sleep apnea, unspecified: Secondary | ICD-10-CM | POA: Insufficient documentation

## 2015-08-05 DIAGNOSIS — Y998 Other external cause status: Secondary | ICD-10-CM | POA: Diagnosis not present

## 2015-08-05 DIAGNOSIS — K219 Gastro-esophageal reflux disease without esophagitis: Secondary | ICD-10-CM | POA: Insufficient documentation

## 2015-08-05 DIAGNOSIS — R011 Cardiac murmur, unspecified: Secondary | ICD-10-CM | POA: Insufficient documentation

## 2015-08-05 DIAGNOSIS — S99922A Unspecified injury of left foot, initial encounter: Secondary | ICD-10-CM | POA: Diagnosis present

## 2015-08-05 DIAGNOSIS — Y9289 Other specified places as the place of occurrence of the external cause: Secondary | ICD-10-CM | POA: Diagnosis not present

## 2015-08-05 HISTORY — DX: Acute embolism and thrombosis of unspecified deep veins of unspecified lower extremity: I82.409

## 2015-08-05 MED ORDER — HYDROCODONE-ACETAMINOPHEN 5-325 MG PO TABS: 1.0000 | ORAL_TABLET | ORAL | Status: DC | PRN

## 2015-08-05 NOTE — ED Provider Notes (Signed)
CSN: [id number]     Arrival date & time 08/05/15  1549 History   First MD Initiated Contact with Patient 08/05/15 1608     Chief Complaint  Patient presents with  . Foot Pain     (Consider location/radiation/quality/duration/timing/severity/associated sxs/prior Treatment) The history is provided by the patient.   Jill [name] is a 50 y.o. female presenting with left foot pain and swelling.  She was walking up steps yesterday, caught her left foot on the lip of the top step and came down on her foot in inversion fashion.  She had immediate pain yesterday but has used ice and elevation with improvement last night, but then worsened again today with weight bearing.  She denies weakness or numbness distal to the injury site.  She denies ankle pain but does have swelling laterally.  She had an acute episode of sharp onset of pain to her left mid lower leg with weight bearing today which has since resolved.    Past Medical History  Diagnosis Date  . Hypertension   . Diabetes mellitus   . 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 Hypercholesterolemia   . Fibroids   . Heart murmur   . Sleep apnea     CPAP q night , last study > 3 yrs. ago  . Seizures ([place])     04/2010  . Anxiety   . Chronic kidney disease     appt. /w urology 11/10/2014- for a cyst seen on [place]  . GERD (gastroesophageal reflux disease)   . Back pain     complains when laying on hard flat surface  . History of stress test     referred for cardiac cath- done here at [facility]- 09/2014  . DVT (deep venous thrombosis) [facility])    Past Surgical History  Procedure Laterality Date  . Cholecystectomy    . Cesarean section  [place]    x2  . Left heart catheterization with coronary angiogram N/A 09/28/2014    Procedure: LEFT HEART CATHETERIZATION WITH CORONARY ANGIOGRAM;  Surgeon: Jettie Booze, MD;  Location: [facility] CATH LAB;  Service: Cardiovascular;  Laterality: N/A;  . Cardiac catheterization    .  Appendectomy    . Tubal ligation    . Radiology with anesthesia N/A 10/14/2014    Procedure: ANGIOPLASTY;  Surgeon: Luanne Bras, MD;  Location: [place];  Service: Radiology;  Laterality: N/A;   Family History  Problem Relation Age of Onset  . Kidney disease Mother     dialysis for 26 years  . Heart failure Father   . Stroke Paternal Grandmother   . Heart disease Father   . Heart attack Mother     52's  . Heart attack Father     57's   Social History  Substance Use Topics  . Smoking status: Current Some Day Smoker -- 0.25 packs/day for 20 years    Types: Cigarettes    Start date: 07/25/1975  . Smokeless tobacco: Never Used  . Alcohol Use: No     Comment: rare use    OB History    No data available     Review of Systems  Musculoskeletal: Positive for joint swelling and arthralgias.  Skin: Negative for wound.  Neurological: Negative for weakness and numbness.      Allergies  Dilaudid; Contrast media; Crab; and Other  Home Medications   Prior to Admission medications   Medication Sig Start Date End  Date Taking? Authorizing Provider  acetaminophen (TYLENOL) 500 MG tablet Take 1,000 mg by mouth every 8 (eight) hours as needed. For pain    Historical Provider, MD  aspirin EC 81 MG tablet Take 81 mg by mouth every morning.    Historical Provider, MD  clopidogrel (PLAVIX) 75 MG tablet Take 75 mg by mouth every morning.    Historical Provider, MD  docusate sodium (COLACE) 100 MG capsule Take 100 mg by mouth daily.    Historical Provider, MD  felodipine (PLENDIL) 5 MG 24 hr tablet Take 5 mg by mouth daily before breakfast.     Historical Provider, MD  furosemide (LASIX) 80 MG tablet Take 0.5 tablets (40 mg total) by mouth daily. 09/29/14   Jettie Booze, MD  HYDROcodone-acetaminophen (NORCO/VICODIN) 5-325 MG tablet Take 1 tablet by mouth every 4 (four) hours as needed. 08/05/15   Evalee Jefferson, PA-C  insulin aspart (NOVOLOG) 100 UNIT/ML injection Inject 20 Units into the  skin at bedtime.     Historical Provider, MD  lamoTRIgine (LAMICTAL) 100 MG tablet Take 100 mg by mouth every morning.     Historical Provider, MD  levETIRAcetam (KEPPRA) 500 MG tablet Take 250 mg by mouth every 12 (twelve) hours.    Historical Provider, MD  LORazepam (ATIVAN) 1 MG tablet Take 1 mg by mouth daily as needed for anxiety.    Historical Provider, MD  metFORMIN (GLUCOPHAGE) 1000 MG tablet Take 1 tablet (1,000 mg total) by mouth 2 (two) times daily with a meal. 09/30/14   Jettie Booze, MD  Multiple Vitamin (MULITIVITAMIN WITH MINERALS) TABS Take 1 tablet by mouth every morning.     Historical Provider, MD  nicotine (NICODERM CQ - DOSED IN MG/24 HOURS) 21 mg/24hr patch Place 21 mg onto the skin daily.    Historical Provider, MD  olmesartan (BENICAR) 40 MG tablet Take 40 mg by mouth every morning.     Historical Provider, MD  pioglitazone (ACTOS) 15 MG tablet Take 15 mg by mouth every morning.     Historical Provider, MD  potassium chloride SA (K-DUR,KLOR-CON) 20 MEQ tablet Take 40 mEq by mouth daily.     Historical Provider, MD  ranitidine (ZANTAC) 150 MG tablet Take 1 tablet (150 mg total) by mouth 2 (two) times daily. 11/24/13   [name], MD  rosuvastatin (CRESTOR) 20 MG tablet Take 20 mg by mouth every morning.     Historical Provider, MD  TOUJEO SOLOSTAR 300 UNIT/ML SOPN Take 60 Units by mouth at bedtime.  10/22/14   Historical Provider, MD   BP 159/82 mmHg  Temp(Src) 98.8 F (37.1 C) (Tympanic)  Resp 18  Ht 5\' 6"  (1.676 m)  Wt 143.79 kg  BMI 51.19 kg/m2  SpO2 98%  LMP 12/10/2014 Physical Exam  Constitutional: She appears well-developed and well-nourished.  HENT:  Head: Normocephalic.  Cardiovascular: Normal rate and intact distal pulses.  Exam reveals no decreased pulses.   Pulses:      Dorsalis pedis pulses are 2+ on the right side, and 2+ on the left side.  Musculoskeletal: She exhibits edema and tenderness.  Edema left lateral malleolus without pain.   Tender left lateral midfoot, mild erythema and edema, no bruising,  Distal sensation intact,  Skin intact.  Neurological: She is alert. No sensory deficit.  Skin: Skin is warm, dry and intact.  Nursing note and vitals reviewed.   ED Course  Procedures (including critical care time) Labs Review Labs Reviewed - No data to display  Imaging Review Dg Ankle Complete Left  08/05/2015  CLINICAL DATA:  Twisting injury left foot and ankle yesterday. Pain. Initial encounter. EXAM: LEFT ANKLE COMPLETE - 3+ VIEW COMPARISON:  Plain films left ankle 11/25/2014. FINDINGS: No acute bony or joint abnormality is seen. Small plantar calcaneal spur is noted. Pes planus deformity is seen. Soft tissues are unremarkable. IMPRESSION: No acute abnormality. Electronically Signed   By: Inge Rise M.D.   On: 08/05/2015 16:47   Dg Foot Complete Left  08/05/2015  CLINICAL DATA:  Left foot pain, twisted left foot yesterday outside steps at home EXAM: LEFT FOOT - COMPLETE 3+ VIEW COMPARISON:  None. FINDINGS: Three views of the left foot submitted. There is minimal displaced oblique fracture distal aspect of fifth metatarsal. Plantar spur of calcaneus is noted. Question subtle nondisplaced fracture at the base of fourth metatarsal. IMPRESSION: Minimal displaced oblique fracture distal aspect fifth metatarsal. Plantar spur of calcaneus. Question subtle nondisplaced fracture at the base of fourth metatarsal. Electronically Signed   By: Lahoma Crocker M.D.   On: 08/05/2015 16:48   I have personally reviewed and evaluated these images and lab results as part of my medical decision-making.   EKG Interpretation None      MDM   Final diagnoses:  Foot fracture, left, closed, initial encounter    Pt with left 5th metatarsal fx, closed.  Placed in cam walker, prescription for walker given as pt with h/o cva and left sided weakness, not good candidate for crutches. Advised RICE, hydrocodone prescribed.  Referral to Dr Aline Brochure  for f/u care.  Pt to call for appt.    Evalee Jefferson, PA-C 08/05/15 [place], DO 08/09/15 2036

## 2015-08-05 NOTE — Discharge Instructions (Signed)
You may take the hydrocodone prescribed for pain relief.  This will make you drowsy - do not drive within 4 hours of taking this medication. ° °

## 2015-08-05 NOTE — ED Notes (Signed)
Pt states left foot pain after twisting the foot yesterday. Denies ankle pain. States pain is to the lateral side of left foot. States pain began radiation up her leg today.

## 2015-08-09 ENCOUNTER — Encounter: Payer: Self-pay | Admitting: Orthopedic Surgery

## 2015-08-09 ENCOUNTER — Ambulatory Visit (INDEPENDENT_AMBULATORY_CARE_PROVIDER_SITE_OTHER): Payer: BLUE CROSS/BLUE SHIELD | Admitting: Orthopedic Surgery

## 2015-08-09 VITALS — BP 140/72 | Ht 66.0 in | Wt 317.0 lb

## 2015-08-09 DIAGNOSIS — S92302A Fracture of unspecified metatarsal bone(s), left foot, initial encounter for closed fracture: Secondary | ICD-10-CM

## 2015-08-09 NOTE — Progress Notes (Signed)
Patient ID: Jill [name], female   DOB: Jun 17, 1966, 50 y.o.   MRN: [id number]  Chief Complaint  Patient presents with  . Foot Injury    er follow up Left foot fracture, DOI 08/05/15    HPI Jill [name] is a 50 y.o. female.  She tripped going up some steps and fractured her left foot  Injury location left foot. Also location of pain. Quality dull ache. Severity mild. Duration 4 days. Timing constant context as stated modified factors cane and Cam Walker seem to be controlling pain  Review of Systems Review of Systems  Constitutional: Negative for fever.  Neurological: Negative for numbness.    Past Medical History  Diagnosis Date  . Hypertension   . Diabetes mellitus   . Stroke 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 Hypercholesterolemia   . Fibroids   . Heart murmur   . Sleep apnea     CPAP q night , last study > 3 yrs. ago  . Seizures ([place])     04/2010  . Anxiety   . Chronic kidney disease     appt. /w urology 11/10/2014- for a cyst seen on [place]  . GERD (gastroesophageal reflux disease)   . Back pain     complains when laying on hard flat surface  . History of stress test     referred for cardiac cath- done here at [facility]- 09/2014  . DVT (deep venous thrombosis) [facility])     Past Surgical History  Procedure Laterality Date  . Cholecystectomy    . Cesarean section  [place]    x2  . Left heart catheterization with coronary angiogram N/A 09/28/2014    Procedure: LEFT HEART CATHETERIZATION WITH CORONARY ANGIOGRAM;  Surgeon: Jettie Booze, MD;  Location: [facility] CATH LAB;  Service: Cardiovascular;  Laterality: N/A;  . Cardiac catheterization    . Appendectomy    . Tubal ligation    . Radiology with anesthesia N/A 10/14/2014    Procedure: ANGIOPLASTY;  Surgeon: Luanne Bras, MD;  Location: [place];  Service: Radiology;  Laterality: N/A;    Family History  Problem Relation Age of Onset  . Kidney disease Mother     dialysis for 26 years  . Heart  failure Father   . Stroke Paternal Grandmother   . Heart disease Father   . Heart attack Mother     40's  . Heart attack Father     46's    Social History Social History  Substance Use Topics  . Smoking status: Current Some Day Smoker -- 0.25 packs/day for 20 years    Types: Cigarettes    Start date: 07/25/1975  . Smokeless tobacco: Never Used  . Alcohol Use: No     Comment: rare use     Allergies  Allergen Reactions  . Dilaudid [Hydromorphone Hcl] Nausea And Vomiting  . Contrast Media [Iodinated Diagnostic Agents] Nausea And Vomiting    "contrast dye vs dilaudid gave pt N&V with last procedure" PT WAS GIVEN IV CONTRAST ON 08/02/2015 WITHOUT ANY REACTION TO CONTRAST.  Otho Darner Allergy] Swelling  . Other Other (See Comments)    "pt is a difficult IV stick, prefers IV team paged for IV starts"    Current Outpatient Prescriptions  Medication Sig Dispense Refill  . acetaminophen (TYLENOL) 500 MG tablet Take 1,000 mg by mouth every 8 (eight) hours as needed. For pain    . aspirin EC 81  MG tablet Take 81 mg by mouth every morning.    . clopidogrel (PLAVIX) 75 MG tablet Take 75 mg by mouth every morning.    . docusate sodium (COLACE) 100 MG capsule Take 100 mg by mouth daily.    . felodipine (PLENDIL) 5 MG 24 hr tablet Take 5 mg by mouth daily before breakfast.     . furosemide (LASIX) 80 MG tablet Take 0.5 tablets (40 mg total) by mouth daily.    Marland Kitchen HYDROcodone-acetaminophen (NORCO/VICODIN) 5-325 MG tablet Take 1 tablet by mouth every 4 (four) hours as needed. 30 tablet 0  . insulin aspart (NOVOLOG) 100 UNIT/ML injection Inject 20 Units into the skin at bedtime.     . lamoTRIgine (LAMICTAL) 100 MG tablet Take 100 mg by mouth every morning.     . levETIRAcetam (KEPPRA) 500 MG tablet Take 250 mg by mouth every 12 (twelve) hours.    Marland Kitchen LORazepam (ATIVAN) 1 MG tablet Take 1 mg by mouth daily as needed for anxiety.    . metFORMIN (GLUCOPHAGE) 1000 MG tablet Take 1 tablet (1,000  mg total) by mouth 2 (two) times daily with a meal.    . Multiple Vitamin (MULITIVITAMIN WITH MINERALS) TABS Take 1 tablet by mouth every morning.     . nicotine (NICODERM CQ - DOSED IN MG/24 HOURS) 21 mg/24hr patch Place 21 mg onto the skin daily.    Marland Kitchen olmesartan (BENICAR) 40 MG tablet Take 40 mg by mouth every morning.     . pioglitazone (ACTOS) 15 MG tablet Take 15 mg by mouth every morning.     . potassium chloride SA (K-DUR,KLOR-CON) 20 MEQ tablet Take 40 mEq by mouth daily.     . ranitidine (ZANTAC) 150 MG tablet Take 1 tablet (150 mg total) by mouth 2 (two) times daily.    . rosuvastatin (CRESTOR) 20 MG tablet Take 20 mg by mouth every morning.     Marland Kitchen TOUJEO SOLOSTAR 300 UNIT/ML SOPN Take 60 Units by mouth at bedtime.   0   No current facility-administered medications for this visit.       Physical Exam Physical Exam  1. Blood pressure 140/72, height 5\' 6"  (1.676 m), weight 317 lb (143.79 kg), last menstrual period 12/10/2014. 2. Appearance, there are no abnormalities in terms of appearance the patient was well-developed and well-nourished. The grooming and hygiene were normal.  3. Mental status orientation, there was normal alertness and orientation 4. Mood pleasant 5. Ambulatory status ambulatory with Cam Walker and a cane  Examination of the left and right foot 6. Inspection no swelling in either foot no tenderness on the right foot but tenderness over the fifth metatarsal shaft 7. Range of motion at the ankle is normal bilaterally 8. Tests for stability ankle stability normal bilaterally 9. Motor strength  muscle tone normal both feet  10. Skin warm dry and intact without laceration or ulceration or erythema 11. Neurologic examination normal sensation 12. Vascular examination normal pulses with warm extremity and normal capillary refill     Data Reviewed I reviewed the x-rays I interpreted the x-rays as a fracture of the fifth metatarsal shaft minimal displacement no  angulation  Assessment  Optimal treatment is nonweightbearing but the patient had a stroke affected her left leg so she and I both decided to use a walker weight bear as tolerated with a Cam Walker understanding that fracture healing may take longer   Plan  X-rays in 6 weeks

## 2015-08-09 NOTE — Patient Instructions (Addendum)
Walker with weight bearing as tolerated with boot

## 2015-08-10 ENCOUNTER — Telehealth (HOSPITAL_COMMUNITY): Payer: Self-pay

## 2015-08-10 NOTE — Telephone Encounter (Signed)
Pt agreed to f/u in 3 months with a CTA per Dr. Estanislado Pandy. AW

## 2015-08-14 IMAGING — DX DG HIP (WITH OR WITHOUT PELVIS) 2-3V*R*
3 series · 3 of 3 positions shown · non-contrast
Comparison: None.

CLINICAL DATA: Right hip pain.  Pelvic pain.  Recent angioplasty.

EXAM:
RIGHT HIP (WITH PELVIS) 2-3 VIEWS

[pelvis ap]
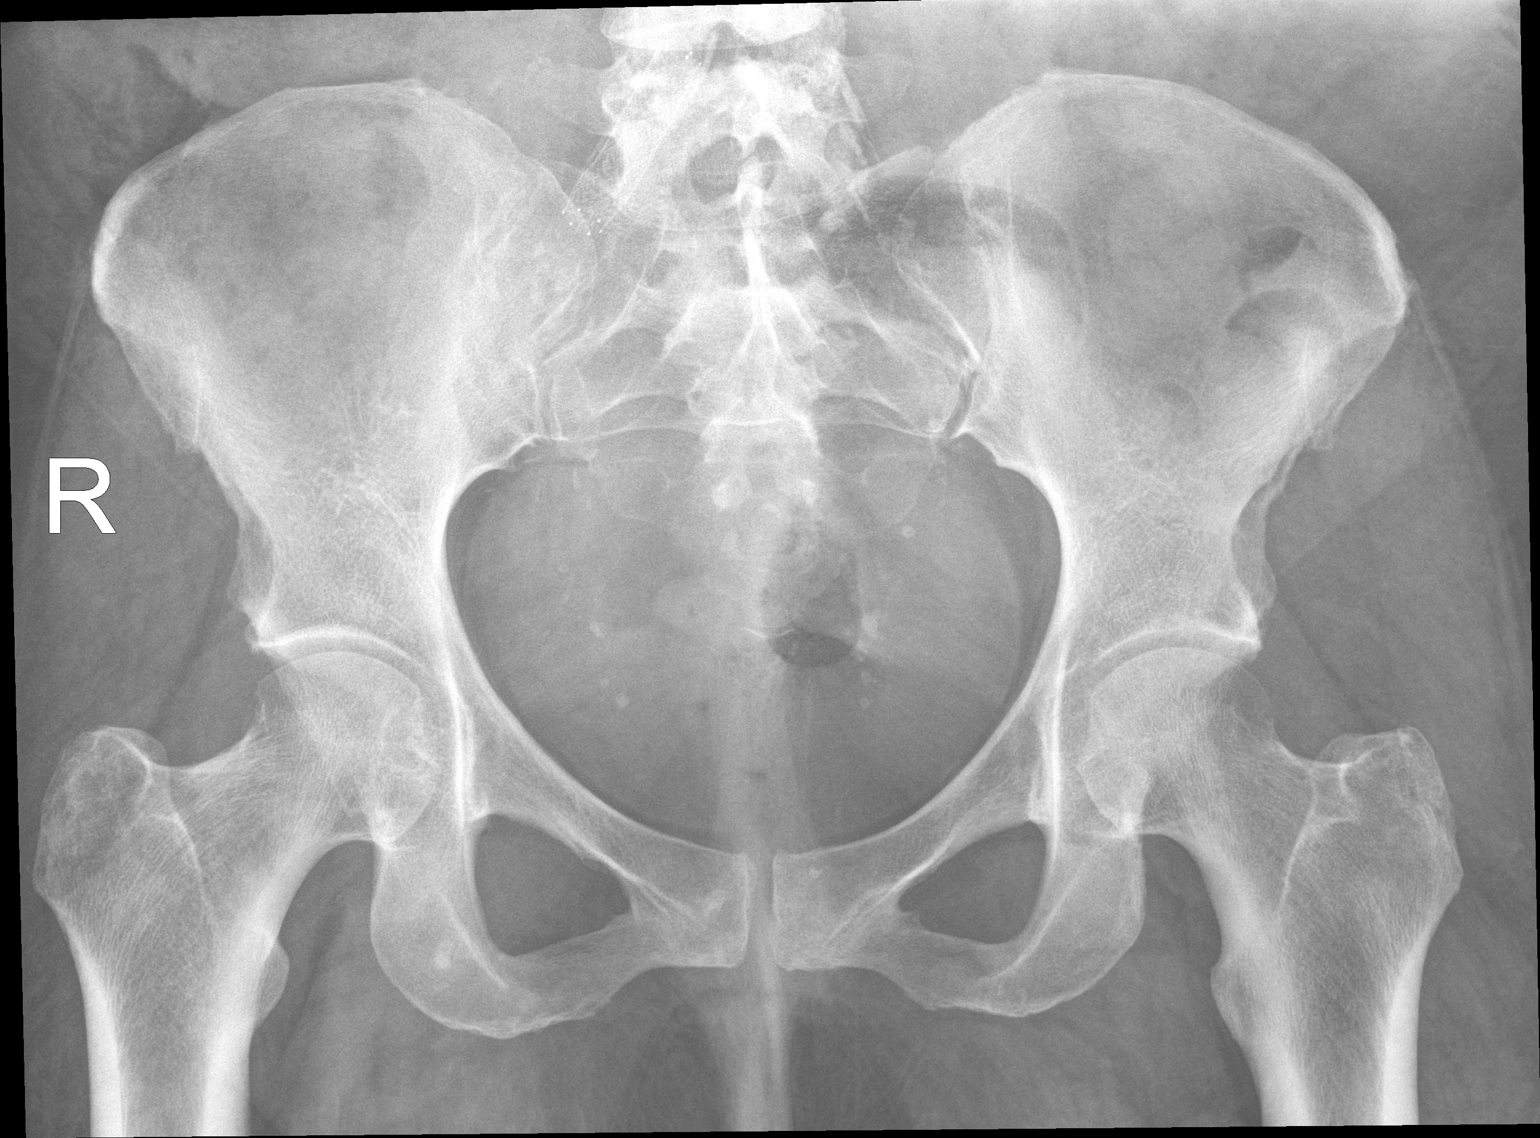

[hip ap]
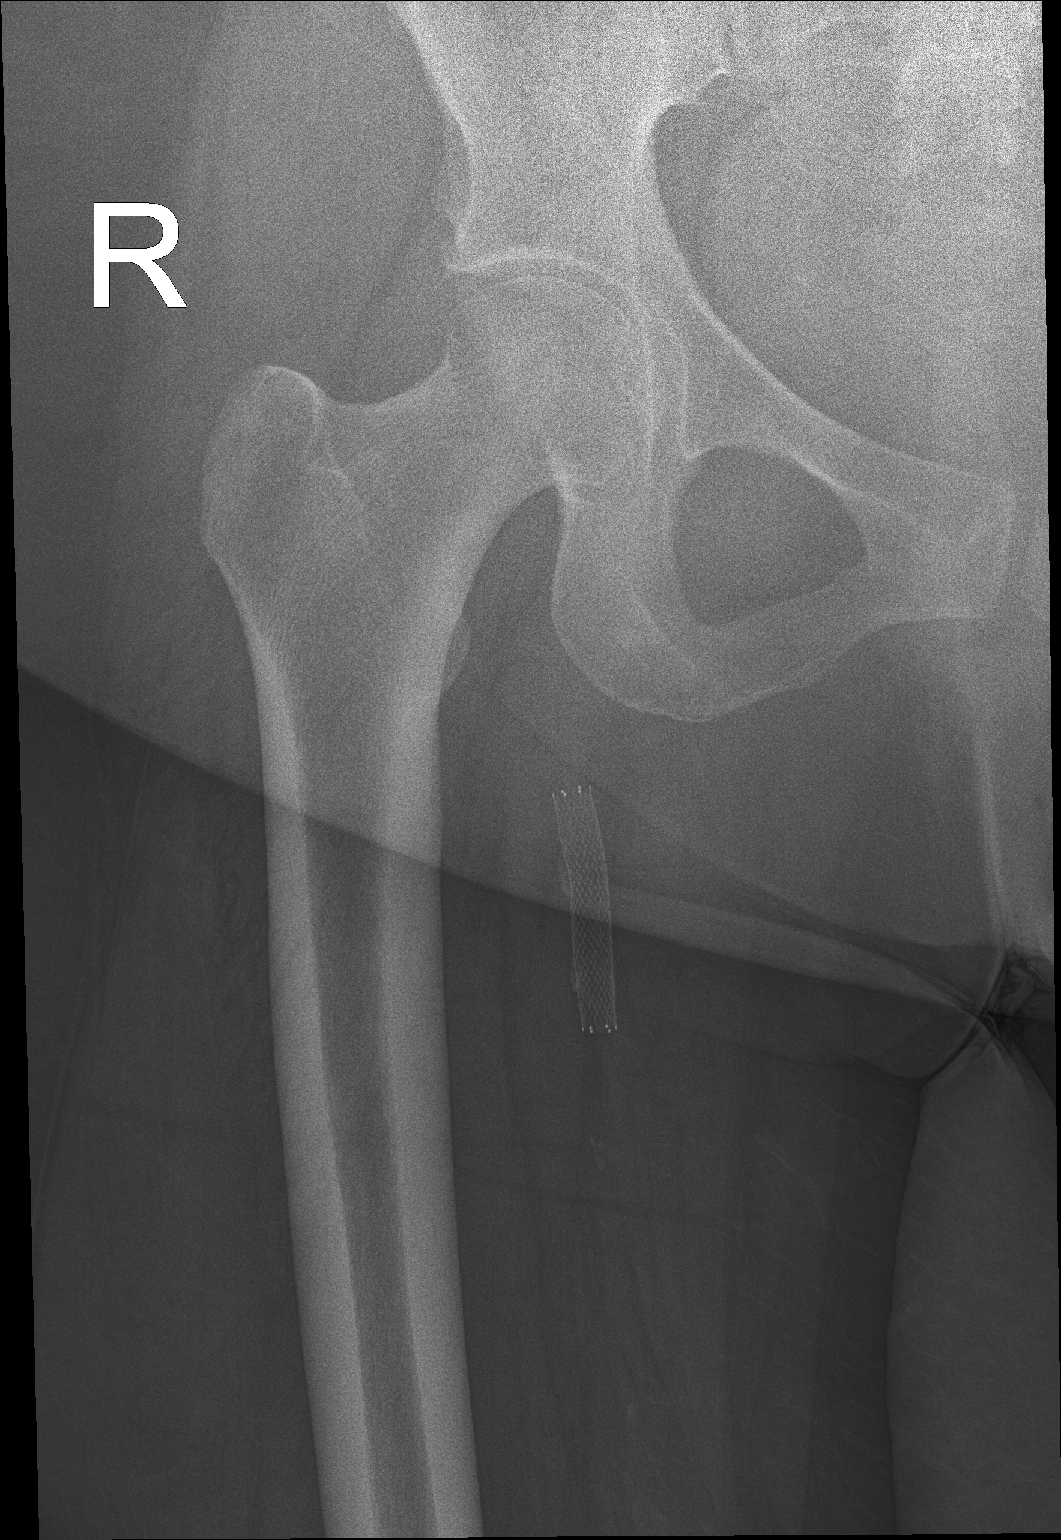

[hip lat]
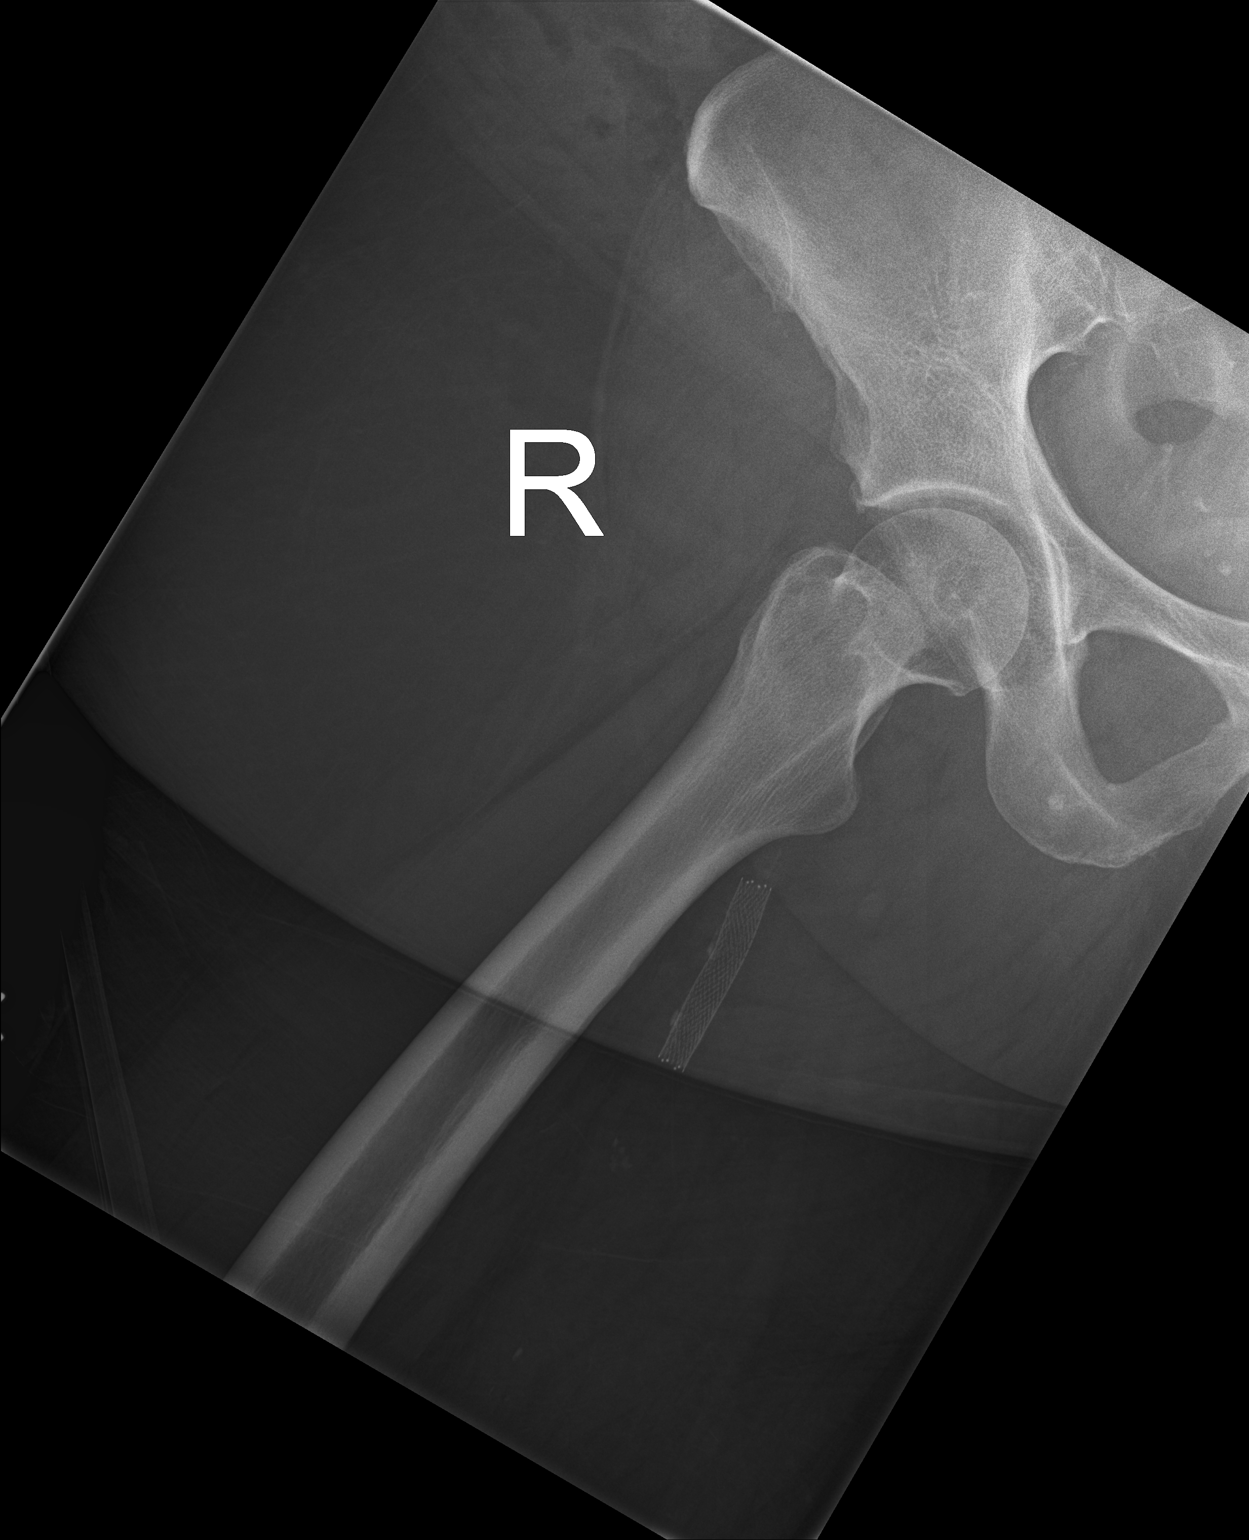

[3 of 3 positions shown; findings below may reference images not displayed]

FINDINGS: Right iliac artery stent. There is a mildly asymmetric sclerosis
laterally in the right femoral neck compared to the contralateral
side.

Articular space in both hips is preserved. No cortical discontinuity
in either hip assumes identified. Vascular stent noted proximally in
the right thigh.
IMPRESSION: 1. Asymmetric sclerosis along the right lateral femoral neck
compared to the left. Although this is an atypical location for a
stress fracture, which more commonly medial, the possibility of
unusual stress reaction as a cause for this difference is raised as
a possibility. Dedicated MRI of the hip could be utilized to further
assess. Given the small area of involvement, entities such as
Paget's disease are considered less likely.

## 2015-09-08 DIAGNOSIS — E118 Type 2 diabetes mellitus with unspecified complications: Secondary | ICD-10-CM | POA: Diagnosis not present

## 2015-09-08 DIAGNOSIS — I10 Essential (primary) hypertension: Secondary | ICD-10-CM | POA: Diagnosis not present

## 2015-09-08 DIAGNOSIS — E785 Hyperlipidemia, unspecified: Secondary | ICD-10-CM | POA: Diagnosis not present

## 2015-09-16 ENCOUNTER — Ambulatory Visit (INDEPENDENT_AMBULATORY_CARE_PROVIDER_SITE_OTHER): Payer: BLUE CROSS/BLUE SHIELD | Admitting: Psychiatry

## 2015-09-20 ENCOUNTER — Ambulatory Visit (INDEPENDENT_AMBULATORY_CARE_PROVIDER_SITE_OTHER): Payer: BLUE CROSS/BLUE SHIELD

## 2015-09-20 ENCOUNTER — Encounter: Payer: Self-pay | Admitting: Orthopedic Surgery

## 2015-09-20 ENCOUNTER — Ambulatory Visit (INDEPENDENT_AMBULATORY_CARE_PROVIDER_SITE_OTHER): Payer: Self-pay | Admitting: Orthopedic Surgery

## 2015-09-20 VITALS — BP 156/67 | Ht 66.0 in | Wt 317.0 lb

## 2015-09-20 DIAGNOSIS — S92902D Unspecified fracture of left foot, subsequent encounter for fracture with routine healing: Secondary | ICD-10-CM

## 2015-09-20 MED ORDER — GABAPENTIN 100 MG PO CAPS: 100.0000 mg | ORAL_CAPSULE | Freq: Three times a day (TID) | ORAL | Status: DC

## 2015-09-20 NOTE — Progress Notes (Signed)
Chief Complaint  Patient presents with  . Follow-up    FOLLOW UP + XRAY LEFT FOOT FX, DOI 08/05/15    xrays fracture still visible but clinically no pain to palpation so I am releasing her

## 2015-10-13 ENCOUNTER — Encounter: Payer: BLUE CROSS/BLUE SHIELD | Attending: General Surgery | Admitting: Dietician

## 2015-10-13 ENCOUNTER — Encounter: Payer: Self-pay | Admitting: Dietician

## 2015-10-13 ENCOUNTER — Ambulatory Visit: Payer: BLUE CROSS/BLUE SHIELD | Admitting: Dietician

## 2015-10-13 NOTE — Patient Instructions (Signed)
Follow Pre-Op Goals Try Protein Shakes Call [facility] at [phone] when surgery is scheduled to enroll in Pre-Op Class  Things to remember:  Please always be honest with us. We want to support you!  If you have any questions or concerns in between appointments, please call or email Liz, Leslie, or Laurie.  The diet after surgery will be high protein and low in carbohydrate.  Vitamins and calcium need to be taken for the rest of your life.  Feel free to include support people in any classes or appointments.   Supplement recommendations:  Complete" Multivitamin: Sleeve Gastrectomy and RYGB patients take a double dose of MVI. LAGB patients take single dose as it is written on the package. Vitamin must be liquid or chewable but not gummy. Examples of these include Flintstones Complete and Centrum Complete. If the vitamin is bariatric-specific, take 1 dose as it is already formulated for bariatric surgery patients. Examples of these are Bariatric Advantage, Celebrate, and Wellesse. These can be found at the [facility] and/or online.     Calcium citrate: 1500 mg/day of Calcium citrate (also chewable or liquid) is recommended for all procedures. The body is only able to absorb 500-600 mg of Calcium at one time so 3 daily doses of 500 mg are recommended. Calcium doses must be taken a minimum of 2 hours apart. Additionally, Calcium must be taken 2 hours apart from iron-containing MVI. Examples of brands include Celebrate, Bariatric Advantage, and Wellesse. These brands must be purchased online or at the [facility]. Citracal Petites is the only Calcium citrate supplement found in general grocery stores and pharmacies. This is in tablet form and may be recommended for patients who do not tolerate chewable Calcium.  Continued or added Vitamin D supplementation based on individual needs.    Vitamin B12: 300-500 mcg/day for Sleeve Gastrectomy and RYGB. Optional for  LAGB patients as stomach remains fully intact. Must be taken intramuscularly, sublingually, or inhaled nasally. Oral route is not recommended. 

## 2015-10-13 NOTE — Progress Notes (Signed)
  Pre-Op Assessment Visit:  Pre-Operative LAGB Surgery  Medical Nutrition Therapy:  Appt start time: 210   End time:  [id number]  Patient was seen on 10/13/2015 for Pre-Operative Nutrition Assessment. Assessment and letter of approval faxed to [facility] Surgery Bariatric Surgery Program coordinator on 10/13/2015.   Preferred Learning Style:   No preference indicated   Learning Readiness:   Ready  Handouts given during visit include:  Pre-Op Goals Bariatric Surgery Protein Shakes   During the appointment today the following Pre-Op Goals were reviewed with the patient: Maintain or lose weight as instructed by your surgeon Make healthy food choices Begin to limit portion sizes Limited concentrated sugars and fried foods Keep fat/sugar in the single digits per serving on   food labels Practice CHEWING your food  (aim for 30 chews per bite or until applesauce consistency) Practice not drinking 15 minutes before, during, and 30 minutes after each meal/snack Avoid all carbonated beverages  Avoid/limit caffeinated beverages  Avoid all sugar-sweetened beverages Consume 3 meals per day; eat every 3-5 hours Make a list of non-food related activities Aim for 64-100 ounces of FLUID daily  Aim for at least 60-80 grams of PROTEIN daily Look for a liquid protein source that contain ?15 g protein and ?5 g carbohydrate  (ex: shakes, drinks, shots)  Demonstrated degree of understanding via:  Teach Back  Teaching Method Utilized:  Visual Auditory Hands on  Barriers to learning/adherence to lifestyle change: none  [facility] to call the [facility] to enroll in Pre-Op and Post-Op Nutrition Education when surgery date is scheduled.

## 2015-10-22 ENCOUNTER — Other Ambulatory Visit (HOSPITAL_COMMUNITY): Payer: Self-pay | Admitting: General Surgery

## 2015-11-08 ENCOUNTER — Other Ambulatory Visit: Payer: Self-pay | Admitting: General Surgery

## 2015-11-09 ENCOUNTER — Telehealth (HOSPITAL_COMMUNITY): Payer: Self-pay

## 2015-11-09 NOTE — Telephone Encounter (Signed)
Called to schedule, left vm for pt to return call. AW

## 2015-11-16 ENCOUNTER — Other Ambulatory Visit (HOSPITAL_COMMUNITY): Payer: Self-pay | Admitting: Interventional Radiology

## 2015-11-16 DIAGNOSIS — I771 Stricture of artery: Secondary | ICD-10-CM

## 2015-11-19 ENCOUNTER — Other Ambulatory Visit (HOSPITAL_COMMUNITY): Payer: Self-pay | Admitting: General Surgery

## 2015-11-22 DIAGNOSIS — E118 Type 2 diabetes mellitus with unspecified complications: Secondary | ICD-10-CM | POA: Diagnosis not present

## 2015-11-22 DIAGNOSIS — I10 Essential (primary) hypertension: Secondary | ICD-10-CM | POA: Diagnosis not present

## 2015-11-22 DIAGNOSIS — Z6841 Body Mass Index (BMI) 40.0 and over, adult: Secondary | ICD-10-CM | POA: Diagnosis not present

## 2015-11-22 DIAGNOSIS — E669 Obesity, unspecified: Secondary | ICD-10-CM | POA: Diagnosis not present

## 2015-11-26 ENCOUNTER — Ambulatory Visit (HOSPITAL_COMMUNITY)
Admission: RE | Admit: 2015-11-26 | Discharge: 2015-11-26 | Disposition: A | Discharge status: Home / Self Care | Payer: BLUE CROSS/BLUE SHIELD | Source: Ambulatory Visit | Attending: General Surgery | Admitting: General Surgery

## 2015-11-26 ENCOUNTER — Ambulatory Visit (HOSPITAL_COMMUNITY): Payer: BLUE CROSS/BLUE SHIELD

## 2015-11-26 ENCOUNTER — Ambulatory Visit (HOSPITAL_COMMUNITY)
Admission: RE | Admit: 2015-11-26 | Discharge: 2015-11-26 | Disposition: A | Discharge status: Home / Self Care | Payer: BLUE CROSS/BLUE SHIELD | Source: Ambulatory Visit | Attending: Family Medicine | Admitting: Family Medicine

## 2015-11-26 ENCOUNTER — Other Ambulatory Visit: Payer: Self-pay

## 2015-11-26 ENCOUNTER — Ambulatory Visit (HOSPITAL_COMMUNITY)
Admission: RE | Admit: 2015-11-26 | Discharge: 2015-11-26 | Disposition: A | Discharge status: Home / Self Care | Payer: BLUE CROSS/BLUE SHIELD | Source: Ambulatory Visit | Attending: Interventional Radiology | Admitting: Interventional Radiology

## 2015-11-26 ENCOUNTER — Other Ambulatory Visit (HOSPITAL_COMMUNITY): Payer: Self-pay | Admitting: General Surgery

## 2015-11-26 DIAGNOSIS — I771 Stricture of artery: Secondary | ICD-10-CM | POA: Insufficient documentation

## 2015-11-26 DIAGNOSIS — I6521 Occlusion and stenosis of right carotid artery: Secondary | ICD-10-CM | POA: Diagnosis not present

## 2015-11-26 DIAGNOSIS — Z01818 Encounter for other preprocedural examination: Secondary | ICD-10-CM | POA: Diagnosis not present

## 2015-11-26 DIAGNOSIS — I6529 Occlusion and stenosis of unspecified carotid artery: Secondary | ICD-10-CM | POA: Insufficient documentation

## 2015-11-26 DIAGNOSIS — I6611 Occlusion and stenosis of right anterior cerebral artery: Secondary | ICD-10-CM | POA: Diagnosis not present

## 2015-11-26 DIAGNOSIS — K449 Diaphragmatic hernia without obstruction or gangrene: Secondary | ICD-10-CM | POA: Insufficient documentation

## 2015-11-26 DIAGNOSIS — J984 Other disorders of lung: Secondary | ICD-10-CM | POA: Diagnosis not present

## 2015-11-26 DIAGNOSIS — I6601 Occlusion and stenosis of right middle cerebral artery: Secondary | ICD-10-CM | POA: Diagnosis not present

## 2015-11-26 DIAGNOSIS — I6621 Occlusion and stenosis of right posterior cerebral artery: Secondary | ICD-10-CM | POA: Diagnosis not present

## 2015-11-26 MED ORDER — IOPAMIDOL (ISOVUE-370) INJECTION 76%
100.0000 mL | Freq: Once | INTRAVENOUS | Status: AC | PRN
  Administered 2015-11-26: 75 mL via INTRAVENOUS

## 2015-12-14 ENCOUNTER — Telehealth (HOSPITAL_COMMUNITY): Payer: Self-pay

## 2015-12-14 NOTE — Telephone Encounter (Signed)
Pt agreed to f/u in 6 months with a CT, not having any symptoms at this time. AW

## 2016-01-14 ENCOUNTER — Other Ambulatory Visit (HOSPITAL_COMMUNITY): Payer: Self-pay | Admitting: General Surgery

## 2016-01-14 ENCOUNTER — Ambulatory Visit (HOSPITAL_COMMUNITY)
Admission: RE | Admit: 2016-01-14 | Discharge: 2016-01-14 | Disposition: A | Discharge status: Home / Self Care | Payer: BLUE CROSS/BLUE SHIELD | Source: Ambulatory Visit | Attending: General Surgery | Admitting: General Surgery

## 2016-01-14 DIAGNOSIS — E662 Morbid (severe) obesity with alveolar hypoventilation: Secondary | ICD-10-CM | POA: Diagnosis not present

## 2016-01-31 ENCOUNTER — Encounter: Payer: BLUE CROSS/BLUE SHIELD | Attending: General Surgery

## 2016-01-31 DIAGNOSIS — Z720 Tobacco use: Secondary | ICD-10-CM | POA: Insufficient documentation

## 2016-01-31 DIAGNOSIS — G4733 Obstructive sleep apnea (adult) (pediatric): Secondary | ICD-10-CM | POA: Diagnosis not present

## 2016-01-31 DIAGNOSIS — I10 Essential (primary) hypertension: Secondary | ICD-10-CM | POA: Insufficient documentation

## 2016-01-31 DIAGNOSIS — E7800 Pure hypercholesterolemia, unspecified: Secondary | ICD-10-CM | POA: Diagnosis not present

## 2016-01-31 DIAGNOSIS — K219 Gastro-esophageal reflux disease without esophagitis: Secondary | ICD-10-CM | POA: Insufficient documentation

## 2016-01-31 DIAGNOSIS — Z8673 Personal history of transient ischemic attack (TIA), and cerebral infarction without residual deficits: Secondary | ICD-10-CM | POA: Insufficient documentation

## 2016-01-31 DIAGNOSIS — E119 Type 2 diabetes mellitus without complications: Secondary | ICD-10-CM | POA: Diagnosis not present

## 2016-02-02 NOTE — Progress Notes (Signed)
  Pre-Operative Nutrition Class:  Appt start time: 4665   End time:  1830.  Patient was seen on 01/31/2016 for Pre-Operative Bariatric Surgery Education at the [facility].   Surgery date: 02/14/2016 Surgery type: sleeve gastrectomy Start weight at [facility]: 320.5 lbs on 10/13/2015 Weight today: 319.8 lbs  TANITA  BODY COMP RESULTS  01/31/16   BMI (kg/m^2) N/A   Fat Mass (lbs)    Fat Free Mass (lbs)    Total Body Water (lbs)     Samples given per MNT protocol. Patient educated on appropriate usage: Bariatric Advantage Calcium Citrate chew (chocolate - qty 1) Lot #: [id number] Exp: 05/2016  Unjury Protein Powder (strawberry - qty 1) Lot #: [id number] Exp: 01/2017  Premier protein shake (vanilla - qty 1) Lot#: [id number] Exp: 09/2016  The following the learning objectives were met by the patient during this course:  Identify Pre-Op Dietary Goals and will begin 2 weeks pre-operatively  Identify appropriate sources of fluids and proteins   State protein recommendations and appropriate sources pre and post-operatively  Identify Post-Operative Dietary Goals and will follow for 2 weeks post-operatively  Identify appropriate multivitamin and calcium sources  Describe the need for physical activity post-operatively and will follow MD recommendations  State when to call healthcare provider regarding medication questions or post-operative complications  Handouts given during class include:  Pre-Op Bariatric Surgery Diet Handout  Protein Shake Handout  Post-Op Bariatric Surgery Nutrition Handout  BELT Program Information Flyer  Support Group Information Flyer  [facility] Bariatric Supplements Price List  Follow-Up Plan: Patient will follow-up at [facility] 2 weeks post operatively for diet advancement per MD.

## 2016-02-09 ENCOUNTER — Inpatient Hospital Stay (HOSPITAL_COMMUNITY)
Admission: RE | Admit: 2016-02-09 | Discharge: 2016-02-09 | Disposition: A | Discharge status: Home / Self Care | Payer: Self-pay | Source: Ambulatory Visit

## 2016-02-14 ENCOUNTER — Inpatient Hospital Stay (HOSPITAL_COMMUNITY)
Admission: RE | Admit: 2016-02-14 | Payer: BLUE CROSS/BLUE SHIELD | Source: Ambulatory Visit | Admitting: General Surgery

## 2016-02-14 ENCOUNTER — Encounter (HOSPITAL_COMMUNITY): Admission: RE | Payer: Self-pay | Source: Ambulatory Visit

## 2016-02-14 SURGERY — GASTRECTOMY, SLEEVE, LAPAROSCOPIC
Anesthesia: General

## 2016-02-22 ENCOUNTER — Ambulatory Visit (INDEPENDENT_AMBULATORY_CARE_PROVIDER_SITE_OTHER): Payer: BLUE CROSS/BLUE SHIELD | Admitting: Cardiology

## 2016-02-22 ENCOUNTER — Encounter: Payer: Self-pay | Admitting: Cardiology

## 2016-02-22 VITALS — BP 161/84 | HR 77 | Ht 67.0 in | Wt 321.0 lb

## 2016-02-22 DIAGNOSIS — E785 Hyperlipidemia, unspecified: Secondary | ICD-10-CM | POA: Diagnosis not present

## 2016-02-22 DIAGNOSIS — R0789 Other chest pain: Secondary | ICD-10-CM | POA: Diagnosis not present

## 2016-02-22 DIAGNOSIS — Z01810 Encounter for preprocedural cardiovascular examination: Secondary | ICD-10-CM

## 2016-02-22 DIAGNOSIS — I10 Essential (primary) hypertension: Secondary | ICD-10-CM | POA: Diagnosis not present

## 2016-02-22 NOTE — Patient Instructions (Signed)
Medication Instructions:  Continue all current medications.  Labwork: None.  Testing/Procedures: None.  Follow-Up: Your physician wants you to follow up in: 6 months.  You will receive a reminder letter in the mail one-two months in advance.  If you don't receive a letter, please call our office to schedule the follow up appointment   Any Other Special Instructions Will Be Listed Below (If Applicable).  If you need a refill on your cardiac medications before your next appointment, please call your pharmacy.  

## 2016-02-22 NOTE — Progress Notes (Signed)
Clinical Summary Jill [name] is a 50 y.o.female seen today for follow up of the following medical problems.   1. Abnormal stress test/Chest pain - as part of a bariatric surgery preop workup she had a nuclear stress that was abnormal - follow up cath 09/2014 showed mild to moderate non-obstructive disease.   - no recent symptoms, remains symptom free since starting antacid  2. Obesity - undergoing evaluation for bariatric surgery. Potentially to have procedure later this month.    3. Hyperlipidemia  - followed by PCP, on crestor 20mg  daily.   4. HTN  - checks at home occasionally, typically around 120-140s/60-70s.   5. PAD - prior right lower intervention with PTA and stenting of right common iliac and right SFA in Jan 2010 - denies any claudication since last visit  6. OSA - compliant with CPAP  7. Tobacco - continues to smoke, working to stop - wearing nicotine patches.  8. Carotid stenosis - followed by vascular. History of left ICA angioplasty 09/2014 for which she remains on ASA and plavix for.      Past Medical History:  Diagnosis Date  . Anxiety   . Back pain    complains when laying on hard flat surface  . Chronic kidney disease    appt. /w urology 11/10/2014- for a cyst seen on [place]  . Diabetes mellitus   . DVT (deep venous thrombosis) ([place])   . Fibroids   . GERD (gastroesophageal reflux disease)   . Heart murmur   . History of stress test    referred for cardiac cath- done here at [facility]- 09/2014  . Hypercholesterolemia   . Hypertension   . Seizures ([place])    04/2010  . Sleep apnea    CPAP q night , last study > 3 yrs. ago  . 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 Dilaudid [Hydromorphone Hcl] Nausea And Vomiting  . Contrast Media [Iodinated Diagnostic Agents] Nausea And Vomiting    "contrast dye vs dilaudid gave pt N&V with last procedure" PT WAS GIVEN IV CONTRAST ON 08/02/2015  WITHOUT ANY REACTION TO CONTRAST.  Otho Darner Allergy] Swelling  . Other Other (See Comments)    "pt is a difficult IV stick, prefers IV team paged for IV starts"     Current Outpatient Prescriptions  Medication Sig Dispense Refill  . acetaminophen (TYLENOL) 500 MG tablet Take 1,000 mg by mouth every 8 (eight) hours as needed. For pain    . aspirin EC 81 MG tablet Take 81 mg by mouth every morning.    . clopidogrel (PLAVIX) 75 MG tablet Take 75 mg by mouth every morning.    . docusate sodium (COLACE) 100 MG capsule Take 100 mg by mouth daily.    . felodipine (PLENDIL) 5 MG 24 hr tablet Take 5 mg by mouth daily before breakfast.     . furosemide (LASIX) 80 MG tablet Take 0.5 tablets (40 mg total) by mouth daily.    Marland Kitchen gabapentin (NEURONTIN) 300 MG capsule Take 1 capsule by mouth daily.  0  . HYDROcodone-acetaminophen (NORCO/VICODIN) 5-325 MG tablet Take 1 tablet by mouth every 4 (four) hours as needed. (Patient not taking: Reported on 01/31/2016) 30 tablet 0  . insulin aspart (NOVOLOG) 100 UNIT/ML injection Inject 20 Units into the skin at bedtime.     . lamoTRIgine (LAMICTAL) 100 MG tablet Take 100 mg by mouth  every morning.     . levETIRAcetam (KEPPRA) 500 MG tablet Take 250 mg by mouth every 12 (twelve) hours.    Marland Kitchen LORazepam (ATIVAN) 1 MG tablet Take 1 mg by mouth daily as needed for anxiety.    . metFORMIN (GLUCOPHAGE) 1000 MG tablet Take 1 tablet (1,000 mg total) by mouth 2 (two) times daily with a meal.    . Multiple Vitamin (MULITIVITAMIN WITH MINERALS) TABS Take 1 tablet by mouth every morning.     . nicotine (NICODERM CQ - DOSED IN MG/24 HOURS) 21 mg/24hr patch Place 21 mg onto the skin daily.    Marland Kitchen olmesartan (BENICAR) 40 MG tablet Take 40 mg by mouth every morning.     . pioglitazone (ACTOS) 15 MG tablet Take 15 mg by mouth every morning.     . potassium chloride (K-DUR) 10 MEQ tablet Take 20 mEq by mouth daily.  0  . ranitidine (ZANTAC) 150 MG tablet Take 1 tablet (150 mg  total) by mouth 2 (two) times daily.    . rosuvastatin (CRESTOR) 20 MG tablet Take 20 mg by mouth every morning.     Marland Kitchen TOUJEO SOLOSTAR 300 UNIT/ML SOPN Take 60 Units by mouth at bedtime.   0   No current facility-administered medications for this visit.      Past Surgical History:  Procedure Laterality Date  . APPENDECTOMY    . CARDIAC CATHETERIZATION    . Oso   x2  . CHOLECYSTECTOMY    . LEFT HEART CATHETERIZATION WITH CORONARY ANGIOGRAM N/A 09/28/2014   Procedure: LEFT HEART CATHETERIZATION WITH CORONARY ANGIOGRAM;  Surgeon: Jettie Booze, MD;  Location: [facility] CATH LAB;  Service: Cardiovascular;  Laterality: N/A;  . RADIOLOGY WITH ANESTHESIA N/A 10/14/2014   Procedure: ANGIOPLASTY;  Surgeon: Luanne Bras, MD;  Location: [place];  Service: Radiology;  Laterality: N/A;  . TUBAL LIGATION       Allergies  Allergen Reactions  . Dilaudid [Hydromorphone Hcl] Nausea And Vomiting  . Contrast Media [Iodinated Diagnostic Agents] Nausea And Vomiting    "contrast dye vs dilaudid gave pt N&V with last procedure" PT WAS GIVEN IV CONTRAST ON 08/02/2015 WITHOUT ANY REACTION TO CONTRAST.  Otho Darner Allergy] Swelling  . Other Other (See Comments)    "pt is a difficult IV stick, prefers IV team paged for IV starts"      Family History  Problem Relation Age of Onset  . Kidney disease Mother     dialysis for 26 years  . Heart failure Father   . Stroke Paternal Grandmother   . Heart disease Father   . Heart attack Mother     75's  . Heart attack Father     68's     Social History Ms. Vanhoosier reports that she has been smoking Cigarettes.  She started smoking about 40 years ago. She has a 5.00 pack-year smoking history. She has never used smokeless tobacco. Ms. Working reports that she does not drink alcohol.   Review of Systems CONSTITUTIONAL: No weight loss, fever, chills, weakness or fatigue.  HEENT: Eyes: No visual loss, blurred vision, double  vision or yellow sclerae.No hearing loss, sneezing, congestion, runny nose or sore throat.  SKIN: No rash or itching.  CARDIOVASCULAR: per HPI RESPIRATORY: No shortness of breath, cough or sputum.  GASTROINTESTINAL: No anorexia, nausea, vomiting or diarrhea. No abdominal pain or blood.  GENITOURINARY: No burning on urination, no polyuria NEUROLOGICAL: No headache, dizziness, syncope, paralysis, ataxia, numbness  or tingling in the extremities. No change in bowel or bladder control.  MUSCULOSKELETAL: No muscle, back pain, joint pain or stiffness.  LYMPHATICS: No enlarged nodes. No history of splenectomy.  PSYCHIATRIC: No history of depression or anxiety.  ENDOCRINOLOGIC: No reports of sweating, cold or heat intolerance. No polyuria or polydipsia.  Marland Kitchen   Physical Examination Vitals:   02/22/16 1603  BP: (!) 161/84  Pulse: 77   Vitals:   02/22/16 1603  Weight: (!) 321 lb (145.6 kg)  Height: 5\' 7"  (1.702 m)    Gen: resting comfortably, no acute distress HEENT: no scleral icterus, pupils equal round and reactive, no palptable cervical adenopathy,  CV: RRR, no m/r/g, no jvd Resp: Clear to auscultation bilaterally GI: abdomen is soft, non-tender, non-distended, normal bowel sounds, no hepatosplenomegaly MSK: extremities are warm, no edema.  Skin: warm, no rash Neuro:  no focal deficits Psych: appropriate affect   Diagnostic Studies 08/2014 Lexiscan MPI IMPRESSION: 1. Large area of ischemia extending from the basal to distal anteroseptal wall.  2. Normal left ventricular wall motion.  3. Left ventricular ejection fraction 60 for%  4. High-risk stress test findings*.  09/2014 Cath HEMODYNAMICS: Aortic pressure was 142/66; LV pressure was 144/7; LVEDP 18. There was no gradient between the left ventricle and aorta.   ANGIOGRAPHIC DATA: The left main coronary artery is widely patent.  The left anterior descending artery is a large vessel which wraps around the apex.  There is mild atherosclerosis in the mid vessel. There is a large diagonal vessel which arises proximally. There are several small diagonals which are patent.  The left circumflex artery is a large dominant vessel. There is a ramus vessel which is large and widely patent. There is mild disease in the circumflex system. The first obtuse marginal is small but patent. There is a small left PDA which is patent.  The right coronary artery is a nondominant vessel. In the mid vessel, there is moderate disease.  LEFT VENTRICULOGRAM: Left ventricular angiogram was not done due to the question of a dye allergy. LVEDP was 18 mmHg.  IMPRESSIONS:  1. Normal left main coronary artery. 2. Mild disease in the left anterior descending artery and its branches. 3. Mild disease in the dominant left circumflex artery and its branches. 4. Moderate disease in the nondominant right coronary artery. 5. Left ventricular systolic function not assessed. LVEDP 18 mmHg. 6. False positive nuclear stress test.  RECOMMENDATION: Continue aggressive preventive therapy given her history of peripheral vascular disease and multiple risk factors for coronary atherosclerosis. She will follow-up with Dr. Harl Bowie.      Assessment and Plan  1. Abnormal stress test/Chest pain - negative cath 09/2014, symptoms resolved with antacids - continue to monitor  2. Hyperlipidemia - continue high dose statin - request pcp labs  3. HTN - home numbers at goal, she will continue current meds  4. PAD - prior intervention, denies any recent symptoms  5. OSA - continue CPAP  6. Tobacco abuse - advised to quit, she is currently using nicotine patches  7. Hx of CVA - known cerebral vascular disease, s/p carotid angiogplasty.  - if considered for bariatric surgery decision on ASA and plavix would be up to interventional neuro.   8. Preoperative evaluvation - from cardiac standpoint ok to proceed with  planned bariatric surgery. As mentioned above I defer decisions on aspirin and plavix to neurology, she is not on these for a cardiac indication.   F/u 6 months  [name], M.D.

## 2016-02-23 ENCOUNTER — Encounter: Payer: Self-pay | Admitting: *Deleted

## 2016-03-10 DIAGNOSIS — Z23 Encounter for immunization: Secondary | ICD-10-CM | POA: Diagnosis not present

## 2016-03-23 DIAGNOSIS — Z8673 Personal history of transient ischemic attack (TIA), and cerebral infarction without residual deficits: Secondary | ICD-10-CM | POA: Diagnosis not present

## 2016-03-23 DIAGNOSIS — Z720 Tobacco use: Secondary | ICD-10-CM | POA: Diagnosis not present

## 2016-03-28 ENCOUNTER — Encounter: Payer: BLUE CROSS/BLUE SHIELD | Attending: General Surgery

## 2016-03-28 ENCOUNTER — Ambulatory Visit: Payer: Self-pay | Admitting: General Surgery

## 2016-03-28 DIAGNOSIS — Z8673 Personal history of transient ischemic attack (TIA), and cerebral infarction without residual deficits: Secondary | ICD-10-CM | POA: Insufficient documentation

## 2016-03-28 DIAGNOSIS — G4733 Obstructive sleep apnea (adult) (pediatric): Secondary | ICD-10-CM | POA: Insufficient documentation

## 2016-03-28 DIAGNOSIS — Z720 Tobacco use: Secondary | ICD-10-CM | POA: Insufficient documentation

## 2016-03-28 DIAGNOSIS — K219 Gastro-esophageal reflux disease without esophagitis: Secondary | ICD-10-CM | POA: Insufficient documentation

## 2016-03-28 DIAGNOSIS — I10 Essential (primary) hypertension: Secondary | ICD-10-CM | POA: Insufficient documentation

## 2016-03-28 DIAGNOSIS — E7800 Pure hypercholesterolemia, unspecified: Secondary | ICD-10-CM | POA: Insufficient documentation

## 2016-03-28 DIAGNOSIS — E119 Type 2 diabetes mellitus without complications: Secondary | ICD-10-CM | POA: Insufficient documentation

## 2016-03-28 NOTE — Progress Notes (Signed)
Please placed surgical orders in epic. Thanks. Pt has preop appt scheduled for 03/30/2016.

## 2016-03-29 DIAGNOSIS — Z6841 Body Mass Index (BMI) 40.0 and over, adult: Secondary | ICD-10-CM | POA: Diagnosis not present

## 2016-03-29 DIAGNOSIS — I10 Essential (primary) hypertension: Secondary | ICD-10-CM | POA: Diagnosis not present

## 2016-03-29 DIAGNOSIS — E118 Type 2 diabetes mellitus with unspecified complications: Secondary | ICD-10-CM | POA: Diagnosis not present

## 2016-03-29 DIAGNOSIS — E669 Obesity, unspecified: Secondary | ICD-10-CM | POA: Diagnosis not present

## 2016-03-30 ENCOUNTER — Encounter (HOSPITAL_COMMUNITY): Payer: Self-pay

## 2016-03-30 ENCOUNTER — Encounter (HOSPITAL_COMMUNITY)
Admission: RE | Admit: 2016-03-30 | Discharge: 2016-03-30 | Disposition: A | Discharge status: Home / Self Care | Payer: BLUE CROSS/BLUE SHIELD | Source: Ambulatory Visit | Attending: General Surgery | Admitting: General Surgery

## 2016-03-30 DIAGNOSIS — Z01818 Encounter for other preprocedural examination: Secondary | ICD-10-CM | POA: Insufficient documentation

## 2016-03-30 LAB — CBC WITH DIFFERENTIAL/PLATELET
BASOS ABS: 0.1 10*3/uL (ref 0.0–0.1)
Basophils Relative: 1 %
EOS ABS: 0.5 10*3/uL (ref 0.0–0.7)
Eosinophils Relative: 4 %
HCT: 47.9 % — ABNORMAL HIGH (ref 36.0–46.0)
HEMOGLOBIN: 16.2 g/dL — AB (ref 12.0–15.0)
LYMPHS ABS: 2.7 10*3/uL (ref 0.7–4.0)
Lymphocytes Relative: 24 %
MCH: 30.6 pg (ref 26.0–34.0)
MCHC: 33.8 g/dL (ref 30.0–36.0)
MCV: 90.4 fL (ref 78.0–100.0)
Monocytes Absolute: 1 10*3/uL (ref 0.1–1.0)
Monocytes Relative: 9 %
NEUTROS PCT: 62 %
Neutro Abs: 6.8 10*3/uL (ref 1.7–7.7)
Platelets: 243 10*3/uL (ref 150–400)
RBC: 5.3 MIL/uL — AB (ref 3.87–5.11)
RDW: 12.4 % (ref 11.5–15.5)
WBC: 10.9 10*3/uL — AB (ref 4.0–10.5)

## 2016-03-30 NOTE — Pre-Procedure Instructions (Signed)
EKG 5'17/ CXR 5'17, CT angio/ neck 5'17 Epic. Labs CMP, LP, Hgb A1C = 7.7 on 03-29-16 -results with chart.

## 2016-03-30 NOTE — Pre-Procedure Instructions (Signed)
Bari Bed requested with portable.

## 2016-03-30 NOTE — Patient Instructions (Addendum)
Jill [name]  03/30/2016   Your procedure is scheduled on: 04-10-16  Report to [facility] 2 Main  Entrance take [facility]  elevators to 3rd floor to  [place] at   [place] AM.  Call this number if you have problems the morning of surgery [phone] Bring cpap mask and tubing.  Remember: ONLY 1 PERSON MAY GO WITH YOU TO SHORT STAY TO GET  READY MORNING OF YOUR SURGERY.  Do not eat food or drink liquids :After Midnight.     Take these medicines the morning of surgery with A SIP OF WATER: Felodipine, Neurontin. Lamictal . Keppra. Ranitidine.Crestor. 1/2 usual dose of Novolog/ Toujeo night before -none AM of. ( For symptoms of low blood sugar may have 2 ounces Clear drink with honey or sugar, or glucose tablets AM of -if needed). Check blood sugar 15 minutes after. DO NOT TAKE ANY DIABETIC MEDICATIONS DAY OF YOUR SURGERY                               You may not have any metal on your body including hair pins and              piercings  Do not wear jewelry, make-up, lotions, powders or perfumes, deodorant             Do not wear nail polish.  Do not shave  48 hours prior to surgery.              Men may shave face and neck.   Do not bring valuables to the hospital. [place].  Contacts, dentures or bridgework may not be worn into surgery.  Leave suitcase in the car. After surgery it may be brought to your room.     Patients discharged the day of surgery will not be allowed to drive home.  Name and phone number of your driver:Carl -[phone] cell / Eldridge Scot, daughter [phone] cell  Special Instructions: N/A              Please read over the following fact sheets you were given: _____________________________________________________________________             [facility] - Preparing for Surgery Before surgery, you can play an important role.  Because skin is not sterile, your skin needs to be as  free of germs as possible.  You can reduce the number of germs on your skin by washing with CHG (chlorahexidine gluconate) soap before surgery.  CHG is an antiseptic cleaner which kills germs and bonds with the skin to continue killing germs even after washing. Please DO NOT use if you have an allergy to CHG or antibacterial soaps.  If your skin becomes reddened/irritated stop using the CHG and inform your nurse when you arrive at Short Stay. Do not shave (including legs and underarms) for at least 48 hours prior to the first CHG shower.  You may shave your face/neck. Please follow these instructions carefully:  1.  Shower with CHG Soap the night before surgery and the  morning of Surgery.  2.  If you choose to wash your hair, wash your hair first as usual with your  normal  shampoo.  3.  After you shampoo,  rinse your hair and body thoroughly to remove the  shampoo.                           4.  Use CHG as you would any other liquid soap.  You can apply chg directly  to the skin and wash                       Gently with a scrungie or clean washcloth.  5.  Apply the CHG Soap to your body ONLY FROM THE NECK DOWN.   Do not use on face/ open                           Wound or open sores. Avoid contact with eyes, ears mouth and genitals (private parts).                       Wash face,  Genitals (private parts) with your normal soap.             6.  Wash thoroughly, paying special attention to the area where your surgery  will be performed.  7.  Thoroughly rinse your body with warm water from the neck down.  8.  DO NOT shower/wash with your normal soap after using and rinsing off  the CHG Soap.                9.  Pat yourself dry with a clean towel.            10.  Wear clean pajamas.            11.  Place clean sheets on your bed the night of your first shower and do not  sleep with pets. Day of Surgery : Do not apply any lotions/deodorants the morning of surgery.  Please wear clean clothes to the  hospital/surgery center.  FAILURE TO FOLLOW THESE INSTRUCTIONS MAY RESULT IN THE CANCELLATION OF YOUR SURGERY PATIENT SIGNATURE_________________________________  NURSE SIGNATURE__________________________________  ________________________________________________________________________

## 2016-03-31 DIAGNOSIS — E1169 Type 2 diabetes mellitus with other specified complication: Secondary | ICD-10-CM | POA: Diagnosis not present

## 2016-03-31 DIAGNOSIS — Z6841 Body Mass Index (BMI) 40.0 and over, adult: Secondary | ICD-10-CM | POA: Diagnosis not present

## 2016-03-31 DIAGNOSIS — I10 Essential (primary) hypertension: Secondary | ICD-10-CM | POA: Diagnosis not present

## 2016-03-31 DIAGNOSIS — E7800 Pure hypercholesterolemia, unspecified: Secondary | ICD-10-CM | POA: Diagnosis not present

## 2016-03-31 DIAGNOSIS — Z9989 Dependence on other enabling machines and devices: Secondary | ICD-10-CM | POA: Diagnosis not present

## 2016-03-31 DIAGNOSIS — E669 Obesity, unspecified: Secondary | ICD-10-CM | POA: Diagnosis not present

## 2016-03-31 DIAGNOSIS — K449 Diaphragmatic hernia without obstruction or gangrene: Secondary | ICD-10-CM | POA: Diagnosis not present

## 2016-03-31 DIAGNOSIS — G4733 Obstructive sleep apnea (adult) (pediatric): Secondary | ICD-10-CM | POA: Diagnosis not present

## 2016-03-31 DIAGNOSIS — K219 Gastro-esophageal reflux disease without esophagitis: Secondary | ICD-10-CM | POA: Diagnosis not present

## 2016-04-03 DIAGNOSIS — E7800 Pure hypercholesterolemia, unspecified: Secondary | ICD-10-CM | POA: Diagnosis not present

## 2016-04-03 DIAGNOSIS — K219 Gastro-esophageal reflux disease without esophagitis: Secondary | ICD-10-CM | POA: Diagnosis not present

## 2016-04-03 NOTE — Pre-Procedure Instructions (Signed)
03-31-16 0830 Lab results- done 03-29-16 Dr. Berdine Addison -CMP, LP, Hgb A1C = 7.7, faxed to Dr. Ok Anis in basket

## 2016-04-04 DIAGNOSIS — I10 Essential (primary) hypertension: Secondary | ICD-10-CM | POA: Diagnosis not present

## 2016-04-10 ENCOUNTER — Inpatient Hospital Stay (HOSPITAL_COMMUNITY): Payer: BLUE CROSS/BLUE SHIELD | Admitting: Certified Registered Nurse Anesthetist

## 2016-04-10 ENCOUNTER — Encounter (HOSPITAL_COMMUNITY)
Admission: RE | Disposition: A | Discharge status: Home / Self Care | Payer: Self-pay | Source: Ambulatory Visit | Attending: General Surgery

## 2016-04-10 ENCOUNTER — Encounter (HOSPITAL_COMMUNITY): Payer: Self-pay | Admitting: *Deleted

## 2016-04-10 ENCOUNTER — Inpatient Hospital Stay (HOSPITAL_COMMUNITY)
Admission: RE | Admit: 2016-04-10 | Discharge: 2016-04-13 | DRG: 621 | Disposition: A | Discharge status: Home / Self Care | Payer: BLUE CROSS/BLUE SHIELD | Source: Ambulatory Visit | Attending: General Surgery | Admitting: General Surgery

## 2016-04-10 DIAGNOSIS — Z8673 Personal history of transient ischemic attack (TIA), and cerebral infarction without residual deficits: Secondary | ICD-10-CM | POA: Diagnosis not present

## 2016-04-10 DIAGNOSIS — Z8261 Family history of arthritis: Secondary | ICD-10-CM | POA: Diagnosis not present

## 2016-04-10 DIAGNOSIS — E7800 Pure hypercholesterolemia, unspecified: Secondary | ICD-10-CM | POA: Diagnosis present

## 2016-04-10 DIAGNOSIS — Z803 Family history of malignant neoplasm of breast: Secondary | ICD-10-CM

## 2016-04-10 DIAGNOSIS — K219 Gastro-esophageal reflux disease without esophagitis: Secondary | ICD-10-CM | POA: Diagnosis present

## 2016-04-10 DIAGNOSIS — Z7982 Long term (current) use of aspirin: Secondary | ICD-10-CM

## 2016-04-10 DIAGNOSIS — Z79899 Other long term (current) drug therapy: Secondary | ICD-10-CM

## 2016-04-10 DIAGNOSIS — F329 Major depressive disorder, single episode, unspecified: Secondary | ICD-10-CM | POA: Diagnosis present

## 2016-04-10 DIAGNOSIS — I739 Peripheral vascular disease, unspecified: Secondary | ICD-10-CM | POA: Diagnosis present

## 2016-04-10 DIAGNOSIS — Z8249 Family history of ischemic heart disease and other diseases of the circulatory system: Secondary | ICD-10-CM

## 2016-04-10 DIAGNOSIS — E119 Type 2 diabetes mellitus without complications: Secondary | ICD-10-CM | POA: Diagnosis present

## 2016-04-10 DIAGNOSIS — K449 Diaphragmatic hernia without obstruction or gangrene: Secondary | ICD-10-CM | POA: Diagnosis present

## 2016-04-10 DIAGNOSIS — E781 Pure hyperglyceridemia: Secondary | ICD-10-CM | POA: Diagnosis present

## 2016-04-10 DIAGNOSIS — K210 Gastro-esophageal reflux disease with esophagitis: Secondary | ICD-10-CM | POA: Diagnosis present

## 2016-04-10 DIAGNOSIS — Z6841 Body Mass Index (BMI) 40.0 and over, adult: Secondary | ICD-10-CM | POA: Diagnosis not present

## 2016-04-10 DIAGNOSIS — Z87891 Personal history of nicotine dependence: Secondary | ICD-10-CM

## 2016-04-10 DIAGNOSIS — I10 Essential (primary) hypertension: Secondary | ICD-10-CM | POA: Diagnosis present

## 2016-04-10 DIAGNOSIS — Z9884 Bariatric surgery status: Secondary | ICD-10-CM

## 2016-04-10 DIAGNOSIS — Z7902 Long term (current) use of antithrombotics/antiplatelets: Secondary | ICD-10-CM | POA: Diagnosis not present

## 2016-04-10 DIAGNOSIS — Z7984 Long term (current) use of oral hypoglycemic drugs: Secondary | ICD-10-CM

## 2016-04-10 DIAGNOSIS — F419 Anxiety disorder, unspecified: Secondary | ICD-10-CM | POA: Diagnosis present

## 2016-04-10 DIAGNOSIS — E1169 Type 2 diabetes mellitus with other specified complication: Secondary | ICD-10-CM

## 2016-04-10 DIAGNOSIS — Z9989 Dependence on other enabling machines and devices: Secondary | ICD-10-CM

## 2016-04-10 DIAGNOSIS — G4733 Obstructive sleep apnea (adult) (pediatric): Secondary | ICD-10-CM | POA: Diagnosis present

## 2016-04-10 DIAGNOSIS — E669 Obesity, unspecified: Secondary | ICD-10-CM

## 2016-04-10 HISTORY — PX: LAPAROSCOPIC GASTRIC SLEEVE RESECTION: SHX5895

## 2016-04-10 LAB — COMPREHENSIVE METABOLIC PANEL
ALK PHOS: 71 U/L (ref 38–126)
ALT: 20 U/L (ref 14–54)
AST: 21 U/L (ref 15–41)
Albumin: 3.7 g/dL (ref 3.5–5.0)
Anion gap: 7 (ref 5–15)
BILIRUBIN TOTAL: 0.5 mg/dL (ref 0.3–1.2)
BUN: 19 mg/dL (ref 6–20)
CALCIUM: 9.9 mg/dL (ref 8.9–10.3)
CO2: 22 mmol/L (ref 22–32)
CREATININE: 0.57 mg/dL (ref 0.44–1.00)
Chloride: 107 mmol/L (ref 101–111)
GFR calc Af Amer: >60 mL/min (ref >60)
Glucose, Bld: 213 mg/dL — ABNORMAL HIGH (ref 65–99)
Potassium: 4 mmol/L (ref 3.5–5.1)
Sodium: 136 mmol/L (ref 135–145)
TOTAL PROTEIN: 7 g/dL (ref 6.5–8.1)

## 2016-04-10 LAB — GLUCOSE, CAPILLARY
GLUCOSE-CAPILLARY: 219 mg/dL — AB (ref 65–99)
GLUCOSE-CAPILLARY: 236 mg/dL — AB (ref 65–99)
GLUCOSE-CAPILLARY: 161 mg/dL — AB (ref 65–99)
Glucose-Capillary: 234 mg/dL — ABNORMAL HIGH (ref 65–99)
Glucose-Capillary: 259 mg/dL — ABNORMAL HIGH (ref 65–99)
Glucose-Capillary: 218 mg/dL — ABNORMAL HIGH (ref 65–99)

## 2016-04-10 LAB — HEMOGLOBIN AND HEMATOCRIT, BLOOD
HCT: 37.3 % (ref 36.0–46.0)
HEMOGLOBIN: 13 g/dL (ref 12.0–15.0)

## 2016-04-10 SURGERY — GASTRECTOMY, SLEEVE, LAPAROSCOPIC
Anesthesia: General | Site: Abdomen

## 2016-04-10 MED ORDER — ROCURONIUM BROMIDE 100 MG/10ML IV SOLN
INTRAVENOUS | Status: AC
  Filled 2016-04-10: qty 1

## 2016-04-10 MED ORDER — INSULIN ASPART 100 UNIT/ML ~~LOC~~ SOLN
SUBCUTANEOUS | Status: AC
  Filled 2016-04-10: qty 1

## 2016-04-10 MED ORDER — ACETAMINOPHEN 10 MG/ML IV SOLN
1000.0000 mg | Freq: Once | INTRAVENOUS | Status: AC
  Administered 2016-04-10: 1000 mg via INTRAVENOUS

## 2016-04-10 MED ORDER — SUGAMMADEX SODIUM 200 MG/2ML IV SOLN
INTRAVENOUS | Status: DC | PRN
  Administered 2016-04-10: 300 mg via INTRAVENOUS

## 2016-04-10 MED ORDER — FENTANYL CITRATE (PF) 100 MCG/2ML IJ SOLN
25.0000 ug | INTRAMUSCULAR | Status: DC | PRN
  Administered 2016-04-10 (×3): 50 ug via INTRAVENOUS

## 2016-04-10 MED ORDER — SUCCINYLCHOLINE CHLORIDE 200 MG/10ML IV SOSY
PREFILLED_SYRINGE | INTRAVENOUS | Status: DC | PRN
  Administered 2016-04-10: 160 mg via INTRAVENOUS

## 2016-04-10 MED ORDER — PROMETHAZINE HCL 25 MG/ML IJ SOLN: 12.5000 mg | Freq: Four times a day (QID) | INTRAMUSCULAR | Status: DC | PRN

## 2016-04-10 MED ORDER — FENTANYL CITRATE (PF) 250 MCG/5ML IJ SOLN
INTRAMUSCULAR | Status: AC
  Filled 2016-04-10: qty 5

## 2016-04-10 MED ORDER — BUPIVACAINE LIPOSOME 1.3 % IJ SUSP
20.0000 mL | Freq: Once | INTRAMUSCULAR | Status: AC
  Administered 2016-04-10: 20 mL
  Filled 2016-04-10: qty 20

## 2016-04-10 MED ORDER — ONDANSETRON HCL 4 MG/2ML IJ SOLN: 4.0000 mg | Freq: Four times a day (QID) | INTRAMUSCULAR | Status: DC | PRN

## 2016-04-10 MED ORDER — ACETAMINOPHEN 10 MG/ML IV SOLN
1000.0000 mg | Freq: Four times a day (QID) | INTRAVENOUS | Status: AC
  Administered 2016-04-10 – 2016-04-11 (×4): 1000 mg via INTRAVENOUS
  Filled 2016-04-10 (×4): qty 100

## 2016-04-10 MED ORDER — ASPIRIN EC 81 MG PO TBEC
81.0000 mg | DELAYED_RELEASE_TABLET | Freq: Every morning | ORAL | Status: DC
  Administered 2016-04-11 – 2016-04-13 (×3): 81 mg via ORAL
  Filled 2016-04-10 (×3): qty 1

## 2016-04-10 MED ORDER — CEFOTETAN DISODIUM-DEXTROSE 2-2.08 GM-% IV SOLR
2.0000 g | INTRAVENOUS | Status: AC
  Administered 2016-04-10: 2 g via INTRAVENOUS

## 2016-04-10 MED ORDER — ONDANSETRON HCL 4 MG/2ML IJ SOLN
4.0000 mg | INTRAMUSCULAR | Status: DC | PRN
  Administered 2016-04-10 – 2016-04-11 (×3): 4 mg via INTRAVENOUS
  Filled 2016-04-10 (×3): qty 2

## 2016-04-10 MED ORDER — ROCURONIUM BROMIDE 100 MG/10ML IV SOLN
INTRAVENOUS | Status: DC | PRN
  Administered 2016-04-10: 60 mg via INTRAVENOUS

## 2016-04-10 MED ORDER — CEFOTETAN DISODIUM-DEXTROSE 2-2.08 GM-% IV SOLR
INTRAVENOUS | Status: AC
  Filled 2016-04-10: qty 50

## 2016-04-10 MED ORDER — OXYCODONE HCL 5 MG/5ML PO SOLN
5.0000 mg | ORAL | Status: DC | PRN
  Administered 2016-04-11 – 2016-04-13 (×7): 5 mg via ORAL
  Filled 2016-04-10 (×8): qty 5

## 2016-04-10 MED ORDER — SODIUM CHLORIDE 0.9 % IJ SOLN
INTRAMUSCULAR | Status: DC | PRN
Start: 2016-04-10 — End: 2016-04-10
  Administered 2016-04-10: 50 mL

## 2016-04-10 MED ORDER — POTASSIUM CHLORIDE IN NACL 20-0.45 MEQ/L-% IV SOLN
INTRAVENOUS | Status: DC
  Administered 2016-04-10: 12:00:00 via INTRAVENOUS
  Administered 2016-04-10: 1000 mL via INTRAVENOUS
  Administered 2016-04-11 – 2016-04-12 (×2): via INTRAVENOUS
  Filled 2016-04-10 (×7): qty 1000

## 2016-04-10 MED ORDER — ACETAMINOPHEN 10 MG/ML IV SOLN
INTRAVENOUS | Status: AC
  Filled 2016-04-10: qty 100

## 2016-04-10 MED ORDER — FENTANYL CITRATE (PF) 100 MCG/2ML IJ SOLN
INTRAMUSCULAR | Status: AC
  Filled 2016-04-10: qty 2

## 2016-04-10 MED ORDER — PROPOFOL 10 MG/ML IV BOLUS
INTRAVENOUS | Status: AC
  Filled 2016-04-10: qty 40

## 2016-04-10 MED ORDER — FENTANYL CITRATE (PF) 100 MCG/2ML IJ SOLN
25.0000 ug | INTRAMUSCULAR | Status: DC | PRN
  Administered 2016-04-10 (×6): 100 ug via INTRAVENOUS
  Administered 2016-04-11: 50 ug via INTRAVENOUS
  Administered 2016-04-11 – 2016-04-12 (×4): 100 ug via INTRAVENOUS
  Filled 2016-04-10 (×11): qty 2

## 2016-04-10 MED ORDER — ACETAMINOPHEN 160 MG/5ML PO SOLN
650.0000 mg | ORAL | Status: DC | PRN
  Administered 2016-04-11: 650 mg via ORAL
  Filled 2016-04-10: qty 20.3

## 2016-04-10 MED ORDER — LACTATED RINGERS IR SOLN
Status: DC | PRN
  Administered 2016-04-10: 1000 mL

## 2016-04-10 MED ORDER — ACETAMINOPHEN 160 MG/5ML PO SOLN
325.0000 mg | ORAL | Status: DC | PRN
  Filled 2016-04-10: qty 20.3

## 2016-04-10 MED ORDER — DIPHENHYDRAMINE HCL 50 MG/ML IJ SOLN: 12.5000 mg | Freq: Three times a day (TID) | INTRAMUSCULAR | Status: DC | PRN

## 2016-04-10 MED ORDER — OXYCODONE HCL 5 MG/5ML PO SOLN: 5.0000 mg | Freq: Once | ORAL | Status: DC | PRN

## 2016-04-10 MED ORDER — MIDAZOLAM HCL 2 MG/2ML IJ SOLN
INTRAMUSCULAR | Status: AC
  Filled 2016-04-10: qty 2

## 2016-04-10 MED ORDER — SUGAMMADEX SODIUM 500 MG/5ML IV SOLN
INTRAVENOUS | Status: AC
  Filled 2016-04-10: qty 5

## 2016-04-10 MED ORDER — PHENYLEPHRINE 40 MCG/ML (10ML) SYRINGE FOR IV PUSH (FOR BLOOD PRESSURE SUPPORT)
PREFILLED_SYRINGE | INTRAVENOUS | Status: DC | PRN
  Administered 2016-04-10 (×7): 80 ug via INTRAVENOUS

## 2016-04-10 MED ORDER — LIDOCAINE 2% (20 MG/ML) 5 ML SYRINGE
INTRAMUSCULAR | Status: DC | PRN
  Administered 2016-04-10: 100 mg via INTRAVENOUS

## 2016-04-10 MED ORDER — LORAZEPAM 2 MG/ML IJ SOLN: 0.5000 mg | Freq: Four times a day (QID) | INTRAMUSCULAR | Status: DC | PRN

## 2016-04-10 MED ORDER — 0.9 % SODIUM CHLORIDE (POUR BTL) OPTIME
TOPICAL | Status: DC | PRN
  Administered 2016-04-10: 1000 mL

## 2016-04-10 MED ORDER — LACTATED RINGERS IV SOLN
INTRAVENOUS | Status: DC | PRN
  Administered 2016-04-10 (×2): via INTRAVENOUS

## 2016-04-10 MED ORDER — PROPOFOL 10 MG/ML IV BOLUS
INTRAVENOUS | Status: DC | PRN
  Administered 2016-04-10: 200 mg via INTRAVENOUS

## 2016-04-10 MED ORDER — ONDANSETRON HCL 4 MG/2ML IJ SOLN
INTRAMUSCULAR | Status: AC
  Filled 2016-04-10: qty 2

## 2016-04-10 MED ORDER — HEPARIN SODIUM (PORCINE) 5000 UNIT/ML IJ SOLN
5000.0000 [IU] | Freq: Three times a day (TID) | INTRAMUSCULAR | Status: AC
  Administered 2016-04-10: 5000 [IU] via SUBCUTANEOUS
  Filled 2016-04-10: qty 1

## 2016-04-10 MED ORDER — LIDOCAINE 2% (20 MG/ML) 5 ML SYRINGE
INTRAMUSCULAR | Status: AC
  Filled 2016-04-10: qty 5

## 2016-04-10 MED ORDER — ENALAPRILAT 1.25 MG/ML IV SOLN
1.2500 mg | Freq: Four times a day (QID) | INTRAVENOUS | Status: DC | PRN
  Filled 2016-04-10: qty 1

## 2016-04-10 MED ORDER — LACTATED RINGERS IV SOLN: INTRAVENOUS | Status: DC

## 2016-04-10 MED ORDER — FENTANYL CITRATE (PF) 100 MCG/2ML IJ SOLN
INTRAMUSCULAR | Status: DC | PRN
  Administered 2016-04-10 (×4): 50 ug via INTRAVENOUS

## 2016-04-10 MED ORDER — APREPITANT 40 MG PO CAPS
40.0000 mg | ORAL_CAPSULE | ORAL | Status: AC
  Administered 2016-04-10: 40 mg via ORAL
  Filled 2016-04-10: qty 1

## 2016-04-10 MED ORDER — CHLORHEXIDINE GLUCONATE 4 % EX LIQD: 60.0000 mL | Freq: Once | CUTANEOUS | Status: DC

## 2016-04-10 MED ORDER — INSULIN ASPART 100 UNIT/ML ~~LOC~~ SOLN
0.0000 [IU] | SUBCUTANEOUS | Status: DC
  Administered 2016-04-10: 7 [IU] via SUBCUTANEOUS
  Administered 2016-04-10: 4 [IU] via SUBCUTANEOUS
  Administered 2016-04-10: 11 [IU] via SUBCUTANEOUS
  Administered 2016-04-11 (×4): 4 [IU] via SUBCUTANEOUS
  Administered 2016-04-11 – 2016-04-12 (×2): 3 [IU] via SUBCUTANEOUS
  Administered 2016-04-12 (×3): 4 [IU] via SUBCUTANEOUS
  Administered 2016-04-12: 7 [IU] via SUBCUTANEOUS
  Administered 2016-04-13: 4 [IU] via SUBCUTANEOUS
  Administered 2016-04-13: 3 [IU] via SUBCUTANEOUS
  Administered 2016-04-13: 4 [IU] via SUBCUTANEOUS
  Administered 2016-04-13: 3 [IU] via SUBCUTANEOUS
  Filled 2016-04-10 (×4): qty 1

## 2016-04-10 MED ORDER — ENOXAPARIN SODIUM 30 MG/0.3ML ~~LOC~~ SOLN
30.0000 mg | Freq: Two times a day (BID) | SUBCUTANEOUS | Status: DC
  Administered 2016-04-11 – 2016-04-13 (×5): 30 mg via SUBCUTANEOUS
  Filled 2016-04-10 (×4): qty 0.3

## 2016-04-10 MED ORDER — SODIUM CHLORIDE 0.9 % IJ SOLN
INTRAMUSCULAR | Status: AC
  Filled 2016-04-10: qty 50

## 2016-04-10 MED ORDER — PREMIER PROTEIN SHAKE
2.0000 [oz_av] | ORAL | Status: DC
  Administered 2016-04-12 – 2016-04-13 (×13): 2 [oz_av] via ORAL
  Filled 2016-04-10: qty 325.31

## 2016-04-10 MED ORDER — HEPARIN SODIUM (PORCINE) 5000 UNIT/ML IJ SOLN
5000.0000 [IU] | INTRAMUSCULAR | Status: AC
  Administered 2016-04-10: 5000 [IU] via SUBCUTANEOUS
  Filled 2016-04-10: qty 1

## 2016-04-10 MED ORDER — DIPHENHYDRAMINE HCL 50 MG/ML IJ SOLN
INTRAMUSCULAR | Status: DC | PRN
  Administered 2016-04-10: 12.5 mg via INTRAVENOUS

## 2016-04-10 MED ORDER — FAMOTIDINE IN NACL 20-0.9 MG/50ML-% IV SOLN
20.0000 mg | Freq: Two times a day (BID) | INTRAVENOUS | Status: DC
  Administered 2016-04-10 – 2016-04-13 (×6): 20 mg via INTRAVENOUS
  Filled 2016-04-10 (×8): qty 50

## 2016-04-10 MED ORDER — EPHEDRINE SULFATE-NACL 50-0.9 MG/10ML-% IV SOSY
PREFILLED_SYRINGE | INTRAVENOUS | Status: DC | PRN
  Administered 2016-04-10: 10 mg via INTRAVENOUS

## 2016-04-10 MED ORDER — PHENYLEPHRINE 40 MCG/ML (10ML) SYRINGE FOR IV PUSH (FOR BLOOD PRESSURE SUPPORT)
PREFILLED_SYRINGE | INTRAVENOUS | Status: AC
  Filled 2016-04-10: qty 20

## 2016-04-10 MED ORDER — INSULIN ASPART 100 UNIT/ML ~~LOC~~ SOLN
SUBCUTANEOUS | Status: DC | PRN
  Administered 2016-04-10: 6 [IU] via INTRAVENOUS

## 2016-04-10 MED ORDER — STERILE WATER FOR IRRIGATION IR SOLN
Status: DC | PRN
  Administered 2016-04-10: 2000 mL

## 2016-04-10 MED ORDER — MIDAZOLAM HCL 5 MG/5ML IJ SOLN
INTRAMUSCULAR | Status: DC | PRN
  Administered 2016-04-10: 2 mg via INTRAVENOUS

## 2016-04-10 MED ORDER — ONDANSETRON HCL 4 MG/2ML IJ SOLN
INTRAMUSCULAR | Status: DC | PRN
  Administered 2016-04-10: 4 mg via INTRAVENOUS

## 2016-04-10 MED ORDER — OXYCODONE HCL 5 MG PO TABS: 5.0000 mg | ORAL_TABLET | Freq: Once | ORAL | Status: DC | PRN

## 2016-04-10 SURGICAL SUPPLY — 64 items
APPLICATOR COTTON TIP 6IN STRL (MISCELLANEOUS) IMPLANT
APPLIER CLIP ROT 10 11.4 M/L (STAPLE)
BLADE SURG SZ11 CARB STEEL (BLADE) ×3 IMPLANT
CABLE HIGH FREQUENCY MONO STRZ (ELECTRODE) IMPLANT
CHLORAPREP W/TINT 26ML (MISCELLANEOUS) ×6 IMPLANT
CLIP APPLIE ROT 10 11.4 M/L (STAPLE) IMPLANT
COVER SURGICAL LIGHT HANDLE (MISCELLANEOUS) IMPLANT
DERMABOND ADVANCED (GAUZE/BANDAGES/DRESSINGS) ×2
DERMABOND ADVANCED .7 DNX12 (GAUZE/BANDAGES/DRESSINGS) ×1 IMPLANT
DEVICE SUT QUICK LOAD TK 5 (STAPLE) IMPLANT
DEVICE SUT TI-KNOT TK 5X26 (MISCELLANEOUS) IMPLANT
DEVICE SUTURE ENDOST 10MM (ENDOMECHANICALS) IMPLANT
DEVICE TI KNOT TK5 (MISCELLANEOUS)
DEVICE TROCAR PUNCTURE CLOSURE (ENDOMECHANICALS) IMPLANT
DRAPE UTILITY XL STRL (DRAPES) ×6 IMPLANT
ELECT L-HOOK LAP 45CM DISP (ELECTROSURGICAL)
ELECT PENCIL ROCKER SW 15FT (MISCELLANEOUS) IMPLANT
ELECT REM PT RETURN 9FT ADLT (ELECTROSURGICAL) ×3
ELECTRODE L-HOOK LAP 45CM DISP (ELECTROSURGICAL) IMPLANT
ELECTRODE REM PT RTRN 9FT ADLT (ELECTROSURGICAL) ×1 IMPLANT
GAUZE SPONGE 4X4 12PLY STRL (GAUZE/BANDAGES/DRESSINGS) IMPLANT
GLOVE BIO SURGEON STRL SZ7.5 (GLOVE) ×3 IMPLANT
GLOVE INDICATOR 8.0 STRL GRN (GLOVE) ×3 IMPLANT
GOWN STRL REUS W/TWL XL LVL3 (GOWN DISPOSABLE) ×12 IMPLANT
HOVERMATT SINGLE USE (MISCELLANEOUS) ×3 IMPLANT
IRRIG SUCT STRYKERFLOW 2 WTIP (MISCELLANEOUS) ×3
IRRIGATION SUCT STRKRFLW 2 WTP (MISCELLANEOUS) ×1 IMPLANT
KIT BASIN OR (CUSTOM PROCEDURE TRAY) ×3 IMPLANT
MARKER SKIN DUAL TIP RULER LAB (MISCELLANEOUS) ×3 IMPLANT
NEEDLE SPNL 22GX3.5 QUINCKE BK (NEEDLE) ×3 IMPLANT
PACK UNIVERSAL I (CUSTOM PROCEDURE TRAY) ×3 IMPLANT
QUICK LOAD TK 5 (STAPLE)
RELOAD STAPLER BLUE 60MM (STAPLE) ×3 IMPLANT
RELOAD STAPLER GOLD 60MM (STAPLE) ×1 IMPLANT
RELOAD STAPLER GREEN 60MM (STAPLE) ×2 IMPLANT
SCISSORS LAP 5X45 EPIX DISP (ENDOMECHANICALS) ×3 IMPLANT
SEALANT SURGICAL APPL DUAL CAN (MISCELLANEOUS) IMPLANT
SHEARS HARMONIC ACE PLUS 45CM (MISCELLANEOUS) ×3 IMPLANT
SLEEVE ADV FIXATION 5X100MM (TROCAR) ×6 IMPLANT
SLEEVE GASTRECTOMY 40FR VISIGI (MISCELLANEOUS) ×3 IMPLANT
SOLUTION ANTI FOG 6CC (MISCELLANEOUS) ×3 IMPLANT
SPONGE LAP 18X18 X RAY DECT (DISPOSABLE) ×3 IMPLANT
STAPLER ECHELON BIOABSB 60 FLE (MISCELLANEOUS) ×15 IMPLANT
STAPLER ECHELON LONG 60 440 (INSTRUMENTS) ×3 IMPLANT
STAPLER RELOAD BLUE 60MM (STAPLE) ×9
STAPLER RELOAD GOLD 60MM (STAPLE) ×3
STAPLER RELOAD GREEN 60MM (STAPLE) ×6
SUT MNCRL AB 4-0 PS2 18 (SUTURE) ×3 IMPLANT
SUT SURGIDAC NAB ES-9 0 48 120 (SUTURE) IMPLANT
SUT VICRYL 0 TIES 12 18 (SUTURE) ×3 IMPLANT
SYR 10ML ECCENTRIC (SYRINGE) ×3 IMPLANT
SYR 20CC LL (SYRINGE) ×3 IMPLANT
SYR 50ML LL SCALE MARK (SYRINGE) ×3 IMPLANT
TOWEL OR 17X26 10 PK STRL BLUE (TOWEL DISPOSABLE) ×3 IMPLANT
TOWEL OR NON WOVEN STRL DISP B (DISPOSABLE) ×3 IMPLANT
TRAY FOLEY W/METER SILVER 16FR (SET/KITS/TRAYS/PACK) IMPLANT
TROCAR ADV FIXATION 5X100MM (TROCAR) ×3 IMPLANT
TROCAR BLADELESS 15MM (ENDOMECHANICALS) ×3 IMPLANT
TROCAR BLADELESS OPT 5 100 (ENDOMECHANICALS) ×3 IMPLANT
TUBE CALIBRATION LAPBAND (TUBING) ×3 IMPLANT
TUBING CONNECTING 10 (TUBING) ×2 IMPLANT
TUBING CONNECTING 10' (TUBING) ×1
TUBING ENDO SMARTCAP (MISCELLANEOUS) ×3 IMPLANT
TUBING INSUF HEATED (TUBING) ×3 IMPLANT

## 2016-04-10 NOTE — Anesthesia Preprocedure Evaluation (Signed)
Anesthesia Evaluation  Patient identified by MRN, date of birth, ID band Patient awake    Reviewed: Allergy & Precautions, H&P , NPO status , Patient's Chart, lab work & pertinent test results  Airway Mallampati: II   Neck ROM: full    Dental   Pulmonary sleep apnea , Current Smoker,    breath sounds clear to auscultation       Cardiovascular hypertension, + Peripheral Vascular Disease   Rhythm:regular Rate:Normal     Neuro/Psych Seizures -,  Anxiety CVA    GI/Hepatic GERD  ,  Endo/Other  diabetes, Type 2Morbid obesity  Renal/GU      Musculoskeletal   Abdominal   Peds  Hematology   Anesthesia Other Findings   Reproductive/Obstetrics                             Anesthesia Physical Anesthesia Plan  ASA: III  Anesthesia Plan: General   Post-op Pain Management:    Induction: Intravenous  Airway Management Planned: Oral ETT  Additional Equipment:   Intra-op Plan:   Post-operative Plan: Extubation in OR  Informed Consent: I have reviewed the patients History and Physical, chart, labs and discussed the procedure including the risks, benefits and alternatives for the proposed anesthesia with the patient or authorized representative who has indicated his/her understanding and acceptance.     Plan Discussed with: CRNA, Anesthesiologist and Surgeon  Anesthesia Plan Comments:         Anesthesia Quick Evaluation

## 2016-04-10 NOTE — Anesthesia Postprocedure Evaluation (Signed)
Anesthesia Post Note  Patient: Jill [name]  Procedure(s) Performed: Procedure(s) (LRB): LAPAROSCOPIC GASTRIC SLEEVE RESECTION WITH UPPER ENDO (N/A)  Patient location during evaluation: PACU Anesthesia Type: General Level of consciousness: awake and alert Pain management: pain level controlled Vital Signs Assessment: post-procedure vital signs reviewed and stable Respiratory status: spontaneous breathing, nonlabored ventilation, respiratory function stable and patient connected to nasal cannula oxygen Cardiovascular status: blood pressure returned to baseline and stable Postop Assessment: no signs of nausea or vomiting Anesthetic complications: no    Last Vitals:  Vitals:   04/10/16 1015 04/10/16 1030  BP: 135/62 (!) 126/58  Pulse: 72 69  Resp: (!) 21 (!) 22  Temp:      Last Pain:  Vitals:   04/10/16 1030  TempSrc:   PainSc: [place]

## 2016-04-10 NOTE — Discharge Instructions (Addendum)
[facility] Discharge Instructions  °On the day of surgery you were given the medication aprepitant. This medication interacts with hormonal forms of birth control (oral contraceptives and injected or implanted birth control) and may make them ineffective. °IF YOU USE ANY HORMONAL FORM OF BIRTH CONTROL, YOU MUST USE AN ADDITIONAL BARRIER BIRTH CONTROL METHOD FOR ONE MONTH after receiving aprepitant or there is a chance you could become pregnant. ° ° ° ° °GASTRIC BYPASS/SLEEVE ° Home Care Instructions ° ° These instructions are to help you care for yourself when you go home. ° °Call: If you have any problems. °• Call [phone] and ask for the surgeon on call °• If you need immediate assistance come to the ER at [facility]. Tell the ER staff you are a new post-op gastric bypass or gastric sleeve patient  °Signs and symptoms to report: • Severe  vomiting or nausea °o If you cannot handle clear liquids for longer than 1 day, call your surgeon °• Abdominal pain which does not get better after taking your pain medication °• Fever greater than 100.4°  F and chills °• Heart rate over 100 beats a minute °• Trouble breathing °• Chest pain °• Redness,  swelling, drainage, or foul odor at incision (surgical) sites °• If your incisions open or pull apart °• Swelling or pain in calf (lower leg) °• Diarrhea (Loose bowel movements that happen often), frequent watery, uncontrolled bowel movements °• Constipation, (no bowel movements for 3 days) if this happens: °o Take Milk of Magnesia, 2 tablespoons by mouth, 3 times a day for 2 days if needed °o Stop taking Milk of Magnesia once you have had a bowel movement °o Call your doctor if constipation continues °Or °o Take Miralax  (instead of Milk of Magnesia) following the label instructions °o Stop taking Miralax once you have had a bowel movement °o Call your doctor if constipation continues °• Anything you think is “abnormal for you” °  °Normal side effects after surgery: • Unable  to sleep at night or unable to concentrate °• Irritability °• Being tearful (crying) or depressed ° °These are common complaints, possibly related to your anesthesia, stress of surgery, and change in lifestyle, that usually go away a few weeks after surgery. If these feelings continue, call your medical doctor.  °Wound Care: You may have surgical glue, steri-strips, or staples over your incisions after surgery °• Surgical glue: Looks like clear film over your incisions and will wear off a little at a time °• Steri-strips: Adhesive strips of tape over your incisions. You may notice a yellowish color on skin under the steri-strips. This is used to make the steri-strips stick better. Do not pull the steri-strips off - let them fall off °• Staples: Staples may be removed before you leave the hospital °o If you go home with staples, call [facility] for an appointment with your surgeon’s nurse to have staples removed 10 days after surgery, [phone] °• Showering: You may shower two (2) days after your surgery unless your surgeon tells you differently °o Wash gently around incisions with warm soapy water, rinse well, and gently pat dry °o If you have a drain (tube from your incision), you may need someone to hold this while you shower °o No tub baths until staples are removed and incisions are healed °  °Medications: • Medications should be liquid or crushed if larger than the size of a dime °• Extended release pills (medication that releases a little bit at   a time through the  day) should not be crushed °• Depending on the size and number of medications you take, you may need to space (take a few throughout the day)/change the time you take your medications so that you do not over-fill your pouch (smaller stomach) °• Make sure you follow-up with you primary care physician to make medication changes needed during rapid weight loss and life -style changes °• If you have diabetes, follow up with your  doctor that orders your diabetes medication(s) within one week after surgery and check your blood sugar regularly ° °• Do not drive while taking narcotics (pain medications) ° °• Do not take acetaminophen (Tylenol) and Roxicet or Lortab Elixir at the same time since these pain medications contain acetaminophen °  °Diet:  °First 2 Weeks You will see the nutritionist about two (2) weeks after your surgery. The nutritionist will increase the types of foods you can eat if you are handling liquids well: °• If you have severe vomiting or nausea and cannot handle clear liquids lasting longer than 1 day call your surgeon °Protein Shake °• Drink at least 2 ounces of shake 5-6 times per day °• Each serving of protein shakes (usually 8-12 ounces) should have a minimum of: °o 15 grams of protein °o And no more than 5 grams of carbohydrate °• Goal for protein each day: °o Men = 80 grams per day °o Women = 60 grams per day °  ° • Protein powder may be added to fluids such as non-fat milk or Lactaid milk or Soy milk (limit to 35 grams added protein powder per serving) ° °Hydration °• Slowly increase the amount of water and other clear liquids as tolerated (See Acceptable Fluids) °• Slowly increase the amount of protein shake as tolerated °• Sip fluids slowly and throughout the day °• May use sugar substitutes in small amounts (no more than 6-8 packets per day; i.e. Splenda) ° °Fluid Goal °• The first goal is to drink at least 8 ounces of protein shake/drink per day (or as directed by the nutritionist); some examples of protein shakes are Syntrax Nectar, Adkins Advantage, EAS Edge HP, and Unjury. - See handout from pre-op Bariatric Education Class: °o Slowly increase the amount of protein shake you drink as tolerated °o You may find it easier to slowly sip shakes throughout the day °o It is important to get your proteins in first °• Your fluid goal is to drink 64-100 ounces of fluid daily °o It may take a few weeks to build up to  this  °• 32 oz. (or more) should be clear liquids °And °• 32 oz. (or more) should be full liquids (see below for examples) °• Liquids should not contain sugar, caffeine, or carbonation ° °Clear Liquids: °• Water of Sugar-free flavored water (i.e. Fruit H²O, Propel) °• Decaffeinated coffee or tea (sugar-free) °• Crystal lite, Wyler’s Lite, Minute Maid Lite °• Sugar-free Jell-O °• Bouillon or broth °• Sugar-free Popsicle:    - Less than 20 calories each; Limit 1 per day ° °Full Liquids: °                  Protein Shakes/Drinks + 2 choices per day of other full liquids °• Full liquids must be: °o No More Than 12 grams of Carbs per serving °o No More Than 3 grams of Fat per serving °• Strained low-fat cream soup °• Non-Fat milk °• Fat-free Lactaid Milk °• Sugar-free yogurt (Dannon Lite & Fit, Greek   yogurt) ° °  °Vitamins and Minerals • Start 1 day after surgery unless otherwise directed by your surgeon °• 2 Chewable Multivitamin / Multimineral Supplement with iron (i.e. Centrum for Adults) °• Vitamin B-12, 350-500 micrograms sub-lingual (place tablet under the tongue) each day °• Chewable Calcium Citrate with Vitamin D-3 °(Example: 3 Chewable Calcium  Plus 600 with Vitamin D-3) °o Take 500 mg three (3) times a day for a total of 1500 mg each day °o Do not take all 3 doses of calcium at one time as it may cause constipation, and you can only absorb 500 mg at a time °o Do not mix multivitamins containing iron with calcium supplements;  take 2 hours apart °o Do not substitute Tums (calcium carbonate) for your calcium °• Menstruating women and those at risk for anemia ( a blood disease that causes weakness) may need extra iron °o Talk to your doctor to see if you need more iron °• If you need extra iron: Total daily Iron recommendation (including Vitamins) is 50 to 100 mg Iron/day °• Do not stop taking or change any vitamins or minerals until you talk to your nutritionist or surgeon °• Your nutritionist and/or surgeon must  approve all vitamin and mineral supplements °  °Activity and Exercise: It is important to continue walking at home. Limit your physical activity as instructed by your doctor. During this time, use these guidelines: °• Do not lift anything greater than ten  (10) pounds for at least two (2) weeks °• Do not go back to work or drive until your surgeon says you can °• You may have sex when you feel comfortable °o It is VERY important for female patients to use a reliable birth control method; fertility often increase after surgery °o Do not get pregnant for at least 18 months °• Start exercising as soon as your doctor tells you that you can °o Make sure your doctor approves any physical activity °• Start with a simple walking program °• Walk 5-15 minutes each day, 7 days per week °• Slowly increase until you are walking 30-45 minutes per day °• Consider joining our BELT program. [phone] or email [email or web address] °  °Special Instructions Things to remember: °• Free counseling is available for you and your family through collaboration between [facility] and [employer]. Please call [phone] and leave a message °• Use your CPAP when sleeping if this applies to you °• Consider buying a medical alert bracelet that says you had lap-band surgery °  °  You will likely have your first fill (fluid added to your band) 6 - 8 weeks after surgery °• [facility] has a free Bariatric Surgery Support Group that meets monthly, the 3rd Thursday, 6pm. [facility] Class[facility]. You can see classes online at [email or web address] °• It is very important to keep all follow up appointments with your surgeon, nutritionist, primary care physician, and behavioral health practitioner °o After the first year, please follow up with your bariatric surgeon and nutritionist at least once a year in order to maintain best weight loss results °      °             [facility]:  [phone] ° °             [facility]: [phone] ° °             Bariatric Nurse Coordinator: [phone]  °Gastric Bypass/Sleeve Home Care   Instructions  Rev. 08/2012    ° °                                                    Reviewed and Endorsed °                                                   by [facility] Patient Education Committee, Jan, 2014 ° ° ° ° ° ° ° ° ° °

## 2016-04-10 NOTE — Anesthesia Procedure Notes (Signed)
Procedure Name: Intubation Date/Time: 04/10/2016 7:44 AM Performed by: Montel Clock Pre-anesthesia Checklist: Patient identified, Emergency Drugs available, Suction available, Patient being monitored and Timeout performed Patient Re-evaluated:Patient Re-evaluated prior to inductionOxygen Delivery Method: Circle system utilized Preoxygenation: Pre-oxygenation with 100% oxygen Intubation Type: IV induction Ventilation: Mask ventilation without difficulty and Oral airway inserted - appropriate to patient size Laryngoscope Size: Mac and 3 Grade View: Grade I Tube type: Oral Tube size: 7.5 mm Number of attempts: 1 Airway Equipment and Method: Stylet Placement Confirmation: ETT inserted through vocal cords under direct vision,  positive ETCO2 and breath sounds checked- equal and bilateral Secured at: 23 cm Tube secured with: Tape Dental Injury: Teeth and Oropharynx as per pre-operative assessment

## 2016-04-10 NOTE — Op Note (Signed)
04/10/2016 Jill [name] Jill [name] September 26, 1965 [id number]   PRE-OPERATIVE DIAGNOSIS:     Morbid obesity with BMI of 51   HTN (hypertension)   Hypertriglyceridemia   GERD (gastroesophageal reflux disease)   History of CVA (cerebrovascular accident) without residual deficits   Diabetes mellitus type 2 in obese (HCC)   OSA on CPAP  POST-OPERATIVE DIAGNOSIS:  same  PROCEDURE:  Procedure(s): LAPAROSCOPIC SLEEVE GASTRECTOMY UPPER GI ENDOSCOPY  SURGEON:  Surgeon(s): [name], MD FACS FASMBS  ASSISTANTS: Johnathan Hausen MD FACS  ANESTHESIA:   general  DRAINS: none   BOUGIE: 71 fr ViSiGi  LOCAL MEDICATIONS USED:  MARCAINE + Exparel  SPECIMEN:  Source of Specimen:  Greater curvature of stomach  DISPOSITION OF SPECIMEN:  PATHOLOGY  COUNTS:  YES  INDICATION FOR PROCEDURE: This is a very pleasant 50 y.o.-year-old morbidly obese female who has had unsuccessful attempts for sustained weight loss. The patient presents today for a planned laparoscopic sleeve gastrectomy with upper endoscopy. We have discussed the risk and benefits of the procedure extensively preoperatively. Please see my separate notes.  PROCEDURE: After obtaining informed consent and receiving 5000 units of subcutaneous heparin, the patient was brought to the operating room at [facility] and placed supine on the operating room table. General endotracheal anesthesia was established. Sequential compression devices were placed. A orogastric tube was placed. The patient's abdomen was prepped and draped in the usual standard surgical fashion. The patient received preoperative IV antibiotic. A surgical timeout was performed. She was maintained on her ASA perioperatively including taking a dose this am given her history of CVA.  Access to the abdomen was achieved using a 5 mm 0 laparoscope thru a 5 mm trocar In the left upper Quadrant 2 fingerbreadths below the left subcostal margin using the Optiview technique.  Pneumoperitoneum was smoothly established up to 15 mm of mercury. The laparoscope was advanced and the abdominal cavity was surveilled. The patient was then placed in reverse Trendelenburg. There were a few omental adhesions in lower midline and RUQ.   A 5 mm trocar was placed slightly above and to the left of the umbilicus under direct visualization.  The [facility] liver retractor was placed under the left lobe of the liver through a 5 mm trocar incision site in the subxiphoid position. A 5 mm trocar was placed in the lateral right upper quadrant along with a 15 mm trocar in the mid right abdomen. A final 5 mm trocar was placed in the lateral LUQ.  All under direct visualization after local had been infiltrated.  The stomach was inspected. It was completely decompressed and the orogastric tube was removed.  There was no anterior dimple that was obviously visible. The calibration tube was placed in the oropharynx and guided down into the stomach by the CRNA. 10 mL of air was insufflated into the calibration balloon. The calibration tubing was then gently pulled back by the CRNA and it did not slide past the GE junction. It did not seem like there was a clinically significant hiatal hernia. At this point the calibration tubing was desufflated and removed.  We identified the pylorus and measured 6 cm proximal to the pylorus and identified an area of where we would start taking down the short gastric vessels. Harmonic scalpel was used to take down the short gastric vessels along the greater curvature of the stomach. We were able to enter the lesser sac. We continued to march along the greater curvature of the stomach taking down the short  gastrics. As we approached the gastrosplenic ligament we took care in this area not to injure the spleen. We were able to take down the entire gastrosplenic ligament. We then mobilized the fundus away from the left crus of diaphragm. There were not any significant posterior  gastric avascular attachments. This left the stomach completely mobilized. No vessels had been taken down along the lesser curvature of the stomach.  We then reidentified the pylorus. A 40Fr ViSiGi was then placed in the oropharynx and advanced down into the stomach and placed in the distal antrum and positioned along the lesser curvature. It was placed under suction which secured the 40Fr ViSiGi in place along the lesser curve. Then using the Ethicon echelon 60 mm stapler with a green load with Seamguard, I placed a stapler along the antrum approximately 5 cm from the pylorus. The stapler was angled so that there is ample room at the angularis incisura. I then fired the first staple load after inspecting it posteriorly to ensure adequate space both anteriorly and posteriorly. At this point I still was not completely past the angularis so with another green load with Seamguard, I placed the stapler in position just inside the prior stapleline. We then rotated the stomach to insure that there was adequate anteriorly as well as posteriorly. The stapler was then fired. At this point I started using 60 mm gold load staple cartridge with Seamguard x 1. The echelon stapler was then repositioned with a 60 mm gold load with Seamguard and we continued to march up along the [place]. My assistant was holding traction along the greater curvature stomach along the cauterized short gastric vessels ensuring that the stomach was symmetrically retracted. Prior to each firing of the staple, we rotated the stomach to ensure that there is adequate stomach left.  As we approached the fundus, I used 60 mm blue cartridge with Seamguard aiming slightly lateral to the esophageal fat pad. Although the staples on this fire had completely gone thru the last part of the stomach it had not completely cut it. Therefore 1 additional 60 blue load was used to free the remaining stomach. The sleeve was inspected. There is no evidence of cork screw.  The staple line appeared hemostatic. The CRNA inflated the ViSiGi to the green zone and the upper abdomen was flooded with saline. There were no bubbles. The sleeve was decompressed and the ViSiGi removed. My assistant scrubbed out and performed an upper endoscopy. The sleeve easily distended with air and the scope was easily advanced to the pylorus. There is no evidence of internal bleeding or cork screwing. There was no narrowing at the angularis. There is no evidence of bubbles. Please see his operative note for further details. The gastric sleeve was decompressed and the endoscope was removed.  The greater curvature the stomach was grasped with a laparoscopic grasper and removed from the 15 mm trocar site.  The liver retractor was removed. I then closed the 15 mm trocar site with 1 interrupted 0 Vicryl sutures through the fascia using the endoclose. The closure was viewed laparoscopically and it was airtight. 70 cc of Exparel was then infiltrated in the preperitoneal spaces around the trocar sites. Pneumoperitoneum was released. All trocar sites were closed with a 4-0 Monocryl in a subcuticular fashion followed by the application of Dermabond. The patient was extubated and taken to the recovery room in stable condition. All needle, instrument, and sponge counts were correct x2. There are no immediate complications  (2) 60 mm  green with Seamguard (1) 60 mm gold with seamguard (3) 60 mm blue with 2 seamguard  PLAN OF CARE: Admit to inpatient   PATIENT DISPOSITION:  PACU - hemodynamically stable.   Delay start of Pharmacological VTE agent (>24hrs) due to surgical blood loss or risk of bleeding:  no  Leighton Ruff. Redmond Pulling, MD, FACS FASMBS General, Bariatric, & Minimally Invasive Surgery [facility], [place]

## 2016-04-10 NOTE — Transfer of Care (Signed)
Immediate Anesthesia Transfer of Care Note  Patient: Jill [name]  Procedure(s) Performed: Procedure(s): LAPAROSCOPIC GASTRIC SLEEVE RESECTION WITH UPPER ENDO (N/A)  Patient Location: PACU  Anesthesia Type:General  Level of Consciousness:  sedated, patient cooperative and responds to stimulation  Airway & Oxygen Therapy:Patient Spontanous Breathing and Patient connected to face mask oxgen  Post-op Assessment:  Report given to PACU RN and Post -op Vital signs reviewed and stable  Post vital signs:  Reviewed and stable  Last Vitals:  Vitals:   04/10/16 0517 04/10/16 0923  BP: (!) 159/93 (!) 143/54  Pulse: 79 76  Resp: 18 16  Temp: [id number] C     Complications: No apparent anesthesia complications

## 2016-04-10 NOTE — H&P (Signed)
Jill [name] 03/31/2016 4:15 PM Location: [facility] Patient #: [id number] DOB: Aug 31, 1965 Married / Language: English / Race: Black or African American Female   History of Present Illness Jill [name]; 04/03/2016 12:17 AM) The patient is a 50 year old female who presents for a preoperative evaluation. She comes in today for her preoperative appointment. She has changed her mind back and wants to proceed with sleeve gastrectomy. She states that she quit smoking August 12. She denies any medical changes otherwise. She denies any chest pain, chest pressure, shortness of breath, TIAs or amaurosis fugax. She does have some GERD. She denies any abdominal pain, nausea or vomiting. She denies any melena or hematochezia. She denies any orthopnea or dyspnea on exertion.  She has comprehensive metabolic panel done on September 6 at her primary care physician's office which showed a normal basic metabolic panel. BUN 10, creatinine 0.61, liver function tests normal, albumin 3.7. Total cholesterol level 163, triglyceride level 211, HDL level 42, LDL level 79. Hemoglobin A1c level 7.7.  She had a urine nicotine test performed on September 6. Her urine nicotine level was 124 which is less than 200 for smokers. Urine cotinine level was 56 (smokers >300).  She saw cardiology in August and was cleared for surgery. Neurology has cleared her for surgery with the expectation that her Plavix can be stopped but she is to continue her aspirin perioperatively  Review of systems-comprehensive 12 point review of systems was performed and all systems are negative except for what is mentioned in the HPI   Problem List/Past Medical Jill [name]; 04/03/2016 12:34 AM) Virgina Evener OBESITY WITH BMI OF 50.0-59.9, ADULT (E66.01) I do think she is a candidate for weight loss surgery as long as she stops smoking. She will be at a little bit higher risk than typical patient and this has been shared  with her on 2 separate occasions now. We discussed the increased risk of leak and infection was smoking if she chooses to proceed with a sleeve gastrectomy. We also discussed how smoking increases her risk of cardiovascular disease. We had a very prolonged conversation about laparoscopic adjustable gastric band. We discussed its risks and benefits such as slippage, erosion, port complications, esophageal dilatation, reflux. We we discussed the typical aftercare with the lap band. We discussed the typical expected weight loss with the average patient, we discussed how that can differ for an African-American female. HIATAL HERNIA (K44.9) HYPERCHOLESTEROLEMIA (E78.00) TOBACCO USE (Z72.0) ANXIETY DISORDER (F41.9)  Other Problems Jill [name]; 04/03/2016 12:34 AM) HYPERTENSION, ESSENTIAL (I10) GASTROESOPHAGEAL REFLUX DISEASE, ESOPHAGITIS PRESENCE NOT SPECIFIED (K21.9) Anxiety Disorder Seizure Disorder Cholelithiasis Depression HISTORY OF STROKE (Z86.73) OBSTRUCTIVE SLEEP APNEA ON CPAP (G47.33) DIABETES MELLITUS TYPE 2 IN OBESE (E11.69)  Past Surgical History Jill [name]; 04/03/2016 12:34 AM) Gallbladder Surgery - Open Cesarean Section - Multiple Appendectomy  Diagnostic Studies History Jill [name]; 04/03/2016 12:34 AM) Pap Smear 1-5 years ago Mammogram 1-3 years ago Colonoscopy never  Allergies [name], [name]; 03/31/2016 3:55 PM) No Known Drug Allergies12/07/2014  Medication History Jill [name]; 04/03/2016 12:34 AM) Rosuvastatin Calcium (20MG  Tablet, Oral) Active. Benicar (40MG  Tablet, Oral) Active. Felodipine ER (5MG  Tablet ER 24HR, Oral) Active. Potassium Chloride Crys ER (10MEQ Tablet ER, Oral) Active. Actos (30MG  Tablet, Oral) Active. Lasix (40MG  Tablet, Oral) Active. MetFORMIN HCl (500MG  Tablet, Oral) Active. Medications Reconciled OxyCODONE HCl (5MG /5ML Solution, 5-10 Milliliter Oral every four hours, as needed, Taken  starting 03/31/2016) Active.  Zofran ODT (4MG  Tablet Disint, 1 (one) Tablet Disperse Oral every six hours, as needed, Taken starting 03/31/2016) Active.  Social History Jill [name]; 04/03/2016 12:34 AM) No alcohol use No caffeine use No drug use Tobacco use Current some day smoker.  Family History Jill [name]; 04/03/2016 12:34 AM) Respiratory Condition Father. Arthritis Mother. Hypertension Father, Mother. Breast Cancer Mother.  Pregnancy / Birth History Jill [name]; 04/03/2016 12:34 AM) Age at menarche 35 years. Irregular periods Gravida 2 Para 2 Maternal age 85-20 Contraceptive History Oral contraceptives.  Vitals ([name] CMA; 03/31/2016 3:55 PM) 03/31/2016 3:52 PM Weight: 323 lb Height: 67in Body Surface Area: 2.48 m Body Mass Index: 50.59 kg/m  Temp.: 61F(Temporal)  Pulse: 76 (Regular)  BP: 132/80 (Sitting, Left Arm, Standard)       Physical Exam Jill Hiss M. Deone Omahoney [name]; 04/03/2016 12:11 AM) General Mental Status-Alert. General Appearance-Consistent with stated age. Hydration-Well hydrated. Voice-Normal. Note: morbidly obese   Head and Neck Head-normocephalic, atraumatic with no lesions or palpable masses. Trachea-midline. Thyroid Gland Characteristics - normal size and consistency.  Eye Eyeball - Bilateral-Extraocular movements intact. Sclera/Conjunctiva - Bilateral-No scleral icterus.  Chest and Lung Exam Chest and lung exam reveals -quiet, even and easy respiratory effort with no use of accessory muscles and on auscultation, normal breath sounds, no adventitious sounds and normal vocal resonance. Inspection Chest Wall - Normal. Back - normal.  Breast - Did not examine.  Cardiovascular Cardiovascular examination reveals -normal heart sounds, regular rate and rhythm with no murmurs and normal pedal pulses bilaterally.  Abdomen Inspection Inspection of the abdomen reveals - No Hernias.  Skin - Scar - Note: right subcostal incision. Palpation/Percussion Palpation and Percussion of the abdomen reveal - Soft, Non Tender, No Rebound tenderness, No Rigidity (guarding) and No hepatosplenomegaly. Auscultation Auscultation of the abdomen reveals - Bowel sounds normal.  Peripheral Vascular Upper Extremity Palpation - Pulses bilaterally normal.  Neurologic Neurologic evaluation reveals -alert and oriented x 3 with no impairment of recent or remote memory. Mental Status-Normal.  Neuropsychiatric The patient's mood and affect are described as -normal. Judgment and Insight-insight is appropriate concerning matters relevant to self.  Musculoskeletal Normal Exam - Left-Upper Extremity Strength Normal and Lower Extremity Strength Normal(some decreased strength in LLE). Normal Exam - Right-Upper Extremity Strength Normal and Lower Extremity Strength Normal.  Lymphatic Head & Neck  General Head & Neck Lymphatics: Bilateral - Description - Normal. Axillary - Did not examine. Femoral & Inguinal - Did not examine.    Assessment & Plan Jill Hiss M. Briggette Najarian [name]; 04/03/2016 12:33 AM) MORBID OBESITY WITH BMI OF 50.0-59.9, ADULT (E66.01) Story: I do think she is a candidate for weight loss surgery as long as she stops smoking. She will be at a little bit higher risk than typical patient and this has been shared with her on 2 separate occasions now. We discussed the increased risk of leak and infection was smoking if she chooses to proceed with a sleeve gastrectomy. We also discussed how smoking increases her risk of cardiovascular disease. We had a very prolonged conversation about laparoscopic adjustable gastric band. We discussed its risks and benefits such as slippage, erosion, port complications, esophageal dilatation, reflux. We we discussed the typical aftercare with the lap band. We discussed the typical expected weight loss with the average patient, we discussed how that can  differ for an African-American female. Impression: More than 90% of the consult time today was spent in counseling the patient.  We discussed her preoperative workup  again. I congratulated her for stopping smoking. We discussed the results of the upper GI. We discussed that intraoperatively we would evaluate her for hiatal hernia and if present go ahead and repair it since she does have some reflux and a hiatal hernia on ugi.  We discussed the importance of her continuing to refrain from smoking. We discussed the importance of the preoperative diet plan. She was given her postoperative pain and nausea medicine prescriptions today. We rediscussed typical postoperative course. We rediscussed that she is slightly higher risk for intraoperative and perioperative complications. She states that she understands and wishes to proceed. Her CBC was abnormal on her preoperative labs the hospital. The results are suggestive of some dehydration. I will to repeat her CBC today just to make sure it is normal. If it is abnormal then obviously that will potentially affect surgery. I also want to repeat her urine nicotine and cotinine next week. if her urine is positive for tobacco surgery will be cancelled. she voiced understanding. i advised her to take her aspirin up to and including the morning of surgery. She was told to stop her plavix on Thursday (last dose on wednesday) Current Plans Pt Education - EMW_preopbariatric CBC, PLATELETS & AUT DIFF (85025) OSA ON CPAP (G47.33) DIABETES MELLITUS TYPE 2 IN OBESE (E11.69) HYPERCHOLESTEROLEMIA (E78.00) GASTROESOPHAGEAL REFLUX DISEASE, ESOPHAGITIS PRESENCE NOT SPECIFIED (K21.9) HYPERTENSION, ESSENTIAL (I10) Impression: pt has been cleared by cardiologist HIATAL HERNIA (K44.9)  Leighton Ruff. Redmond Pulling, [name], FACS General, Bariatric, & Minimally Invasive Surgery [facility], [place]

## 2016-04-10 NOTE — Interval H&P Note (Signed)
History and Physical Interval Note:  04/10/2016 7:13 AM  Jill [name]  has presented today for surgery, with the diagnosis of MORBID OBESITY  The various methods of treatment have been discussed with the patient and family. After consideration of risks, benefits and other options for treatment, the patient has consented to  Procedure(s): LAPAROSCOPIC GASTRIC SLEEVE RESECTION WITH UPPER ENDO (N/A) as a surgical intervention .  The patient's history has been reviewed, patient examined, no change in status, stable for surgery.  I have reviewed the patient's chart and labs.  Questions were answered to the patient's satisfaction.    Leighton Ruff. Redmond Pulling, MD, [place], Bariatric, & Minimally Invasive Surgery [facility], [place]   [facility] M

## 2016-04-10 NOTE — Progress Notes (Signed)
Lt nasal trumpet removed upon arrival to pacu

## 2016-04-10 NOTE — Op Note (Signed)
Jill [name] [id number] 05-30-1966 04/10/2016  Preoperative diagnosis: sleeve gastrectomy in progress  Postoperative diagnosis: Same   Procedure: Upper endoscopy   Surgeon: Catalina Antigua B. Hassell Done  M.D., FACS   Anesthesia: Gen.   Indications for procedure: This patient was undergoing a sleeve gastrectomy by Dr. Redmond Pulling and endoscopy was performed to rule out leaks and bleeding.    Description of procedure: The endoscopy was placed in the mouth and into the oropharynx and under endoscopic vision it was advanced to the esophagogastric junction.  The sleeve was insufflated and the tube passed easily into the antrum.  No bubbles were seen with insufflation.  No hiatal hernia was seen.  .   No bleeding or leaks were detected.  The scope was withdrawn without difficulty.     Matt B. Hassell Done, MD, FACS General, Bariatric, & Minimally Invasive Surgery [facility], [place]

## 2016-04-11 ENCOUNTER — Ambulatory Visit: Payer: Self-pay

## 2016-04-11 LAB — COMPREHENSIVE METABOLIC PANEL
ALBUMIN: 3.8 g/dL (ref 3.5–5.0)
ALT: 41 U/L (ref 14–54)
AST: 42 U/L — AB (ref 15–41)
Alkaline Phosphatase: 78 U/L (ref 38–126)
Anion gap: 7 (ref 5–15)
BUN: 8 mg/dL (ref 6–20)
CHLORIDE: 106 mmol/L (ref 101–111)
CO2: 24 mmol/L (ref 22–32)
Calcium: 9.6 mg/dL (ref 8.9–10.3)
Creatinine, Ser: 0.48 mg/dL (ref 0.44–1.00)
GFR calc Af Amer: >60 mL/min (ref >60)
GFR calc non Af Amer: >60 mL/min (ref >60)
GLUCOSE: 164 mg/dL — AB (ref 65–99)
POTASSIUM: 4.6 mmol/L (ref 3.5–5.1)
Sodium: 137 mmol/L (ref 135–145)
Total Bilirubin: 0.8 mg/dL (ref 0.3–1.2)
Total Protein: 7.3 g/dL (ref 6.5–8.1)

## 2016-04-11 LAB — GLUCOSE, CAPILLARY
GLUCOSE-CAPILLARY: 171 mg/dL — AB (ref 65–99)
Glucose-Capillary: 150 mg/dL — ABNORMAL HIGH (ref 65–99)
Glucose-Capillary: 153 mg/dL — ABNORMAL HIGH (ref 65–99)
Glucose-Capillary: 153 mg/dL — ABNORMAL HIGH (ref 65–99)
Glucose-Capillary: 164 mg/dL — ABNORMAL HIGH (ref 65–99)
Glucose-Capillary: 184 mg/dL — ABNORMAL HIGH (ref 65–99)

## 2016-04-11 LAB — CBC WITH DIFFERENTIAL/PLATELET
BASOS ABS: 0 10*3/uL (ref 0.0–0.1)
BASOS PCT: 0 %
EOS ABS: 0.3 10*3/uL (ref 0.0–0.7)
EOS PCT: 3 %
HCT: 41.1 % (ref 36.0–46.0)
Hemoglobin: 14.3 g/dL (ref 12.0–15.0)
Lymphocytes Relative: 16 %
Lymphs Abs: 2 10*3/uL (ref 0.7–4.0)
MCH: 30.9 pg (ref 26.0–34.0)
MCHC: 34.8 g/dL (ref 30.0–36.0)
MCV: 88.8 fL (ref 78.0–100.0)
MONO ABS: 1.1 10*3/uL — AB (ref 0.1–1.0)
Monocytes Relative: 9 %
NEUTROS ABS: 9.2 10*3/uL — AB (ref 1.7–7.7)
Neutrophils Relative %: 72 %
PLATELETS: 208 10*3/uL (ref 150–400)
RBC: 4.63 MIL/uL (ref 3.87–5.11)
RDW: 12.2 % (ref 11.5–15.5)
WBC: 12.6 10*3/uL — ABNORMAL HIGH (ref 4.0–10.5)

## 2016-04-11 LAB — HEMOGLOBIN AND HEMATOCRIT, BLOOD
HCT: 43 % (ref 36.0–46.0)
HEMOGLOBIN: 14.8 g/dL (ref 12.0–15.0)

## 2016-04-11 MED ORDER — IRBESARTAN 150 MG PO TABS
300.0000 mg | ORAL_TABLET | Freq: Every day | ORAL | Status: DC
  Administered 2016-04-11 – 2016-04-13 (×3): 300 mg via ORAL
  Filled 2016-04-11 (×3): qty 2

## 2016-04-11 MED ORDER — LEVETIRACETAM 250 MG PO TABS
250.0000 mg | ORAL_TABLET | Freq: Two times a day (BID) | ORAL | Status: DC
  Administered 2016-04-11 – 2016-04-13 (×5): 250 mg via ORAL
  Filled 2016-04-11 (×5): qty 1

## 2016-04-11 MED ORDER — SIMETHICONE 40 MG/0.6ML PO SUSP
40.0000 mg | Freq: Four times a day (QID) | ORAL | Status: DC | PRN
  Administered 2016-04-11: 40 mg via ORAL
  Filled 2016-04-11 (×3): qty 0.6

## 2016-04-11 MED ORDER — LEVETIRACETAM 100 MG/ML PO SOLN: 250.0000 mg | Freq: Two times a day (BID) | ORAL | Status: DC

## 2016-04-11 MED ORDER — LAMOTRIGINE 100 MG PO TABS
100.0000 mg | ORAL_TABLET | Freq: Every morning | ORAL | Status: DC
  Administered 2016-04-11 – 2016-04-13 (×3): 100 mg via ORAL
  Filled 2016-04-11 (×3): qty 1

## 2016-04-11 NOTE — Progress Notes (Addendum)
Patient ID: Jill [name], female   DOB: Jan 12, 1966, 50 y.o.   MRN: [id number]  Progress Note: Metabolic and Bariatric Surgery Service   Subjective: Water went well but pain meds doesn't last long enough. Not a lot of nausea but a fair amount of burping. ambulated  Objective: Vital signs in last 24 hours: Temp:  [97.9 F (36.6 C)-98.9 F (37.2 C)] 98.4 F (36.9 C) (09/19 0554) Pulse Rate:  [69-80] 74 (09/19 0554) Resp:  [15-24] 16 (09/19 0554) BP: (108-166)/(52-80) 147/64 (09/19 0554) SpO2:  [94 %-100 %] 98 % (09/19 0554) Weight:  [144.7 kg (319 lb 1.6 oz)] 144.7 kg (319 lb 1.6 oz) (09/19 0740) Last BM Date: 04/09/16  Intake/Output from previous day: 09/18 0701 - 09/19 0700 In: 4477.9 [P.O.:180; I.V.:3947.9; IV Piggyback:350] Out: 1960 [id number]; Blood:10] Intake/Output this shift: No intake/output data recorded.  Lungs: cta  Cardiovascular: reg  Abd: soft, nd, incisions c/d/i  Extremities: no edema, +scds  Neuro: oriented; smiling, sitting in chair  Lab Results: CBC   Recent Labs  04/10/16 0928 04/11/16 0456  WBC  --  12.6*  HGB 13.0 14.3  HCT 37.3 41.1  PLT  --  208   BMET  Recent Labs  04/10/16 0543 04/11/16 0456  NA 136 137  K 4.0 4.6  CL 107 106  CO2 22 24  GLUCOSE 213* 164*  BUN 19 8  CREATININE 0.57 0.48  CALCIUM 9.9 9.6   PT/INR No results for input(s): LABPROT, INR in the last 72 hours. ABG No results for input(s): PHART, HCO3 in the last 72 hours.  Invalid input(s): PCO2, PO2  Studies/Results:  Anti-infectives: Anti-infectives    Start     Dose/Rate Route Frequency Ordered Stop   04/10/16 0510  cefoTEtan in Dextrose 5% (CEFOTAN) IVPB 2 g     2 g Intravenous On call to O.R. 04/10/16 0511 04/10/16 0748      Medications: Scheduled Meds: . acetaminophen  1,000 mg Intravenous Q6H  . aspirin EC  81 mg Oral q morning - 10a  . enoxaparin (LOVENOX) injection  30 mg Subcutaneous Q12H  . famotidine (PEPCID) IV  20 mg Intravenous  Q12H  . insulin aspart  0-20 Units Subcutaneous Q4H  . irbesartan  300 mg Oral Daily  . lamoTRIgine  100 mg Oral q morning - 10a  . levETIRAcetam  250 mg Oral BID  . [START ON 04/12/2016] protein supplement shake  2 oz Oral Q2H   Continuous Infusions: . 0.45 % NaCl with KCl 20 mEq / L 125 mL/hr at 04/11/16 0554   PRN Meds:.oxyCODONE **AND** acetaminophen, acetaminophen (TYLENOL) oral liquid 160 mg/5 mL, diphenhydrAMINE, enalaprilat, fentaNYL (SUBLIMAZE) injection, LORazepam, ondansetron (ZOFRAN) IV, promethazine, simethicone  Assessment/Plan: Patient Active Problem List   Diagnosis Date Noted  . Morbid obesity with BMI of 50.0-59.9, adult ([place]) 04/10/2016  . GERD (gastroesophageal reflux disease) 04/10/2016  . History of CVA (cerebrovascular accident) without residual deficits 04/10/2016  . Diabetes mellitus type 2 in obese ([place]) 04/10/2016  . OSA on CPAP 04/10/2016  . 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 Carotid stenosis, symptomatic w/o infarct 10/14/2014  . Abnormal stress test   . Stenosis of artery ([place])   . CVA (cerebral vascular accident) ([place]) 06/12/2013  . HTN (hypertension) 06/12/2013  . Hypertriglyceridemia 06/12/2013  . PFO (patent foramen ovale) 06/12/2013   s/p Procedure(s): LAPAROSCOPIC GASTRIC SLEEVE RESECTION WITH UPPER ENDO 04/10/2016  Looks good. No fever. No tachycardia.  Adv to pod 2 diet Resume oral bp and  anti sz meds Cont chemical vte prophylaxis Cont ASA given h/o CVA per neurologist  Disposition:  LOS: 1 day  The patient does not meet criteria for discharge because she did not meet oral intake goals and is at high risk of dehydration.    Leighton Ruff. Redmond Pulling, MD, FACS General, Bariatric, & Minimally Invasive Surgery [facility], [place]   [name], MD [phone] [facility]

## 2016-04-11 NOTE — Plan of Care (Signed)
Problem: Food- and Nutrition-Related Knowledge Deficit (NB-1.1) Goal: Nutrition education Formal process to instruct or train a patient/client in a skill or to impart knowledge to help patients/clients voluntarily manage or modify food choices and eating behavior to maintain or improve health. Outcome: Completed/Met Date Met: 04/11/16 Nutrition Education Note  Received consult for diet education per DROP protocol.   Discussed 2 week post op diet with pt. Emphasized that liquids must be non carbonated, non caffeinated, and sugar free. Fluid goals discussed. Reviewed progression of diet to include soft proteins at 7-10 days post-op. Pt to follow up with outpatient bariatric RD for further diet progression after 2 weeks. Multivitamins and minerals also reviewed. Teach back method used, pt expressed understanding, expect good compliance.   Diet: First 2 Weeks  You will see the dietitian about two (2) weeks after your surgery. The dietitian will increase the types of foods you can eat if you are handling liquids well:  If you have severe vomiting or nausea and cannot handle clear liquids lasting longer than 1 day, call your surgeon  Protein Shake  Drink at least 2 ounces of shake 5-6 times per day  Each serving of protein shakes (usually 8 - 12 ounces) should have a minimum of:  15 grams of protein  And no more than 5 grams of carbohydrate  Goal for protein each day:  Men = 80 grams per day  Women = 60 grams per day  Protein powder may be added to fluids such as non-fat milk or Lactaid milk or Soy milk (limit to 35 grams added protein powder per serving)   Hydration  Slowly increase the amount of water and other clear liquids as tolerated (See Acceptable Fluids)  Slowly increase the amount of protein shake as tolerated  Sip fluids slowly and throughout the day  May use sugar substitutes in small amounts (no more than 6 - 8 packets per day; i.e. Splenda)   Fluid Goal  The first goal is to  drink at least 8 ounces of protein shake/drink per day (or as directed by the nutritionist); some examples of protein shakes are [employer], [employer], EAS Edge HP, and Unjury. See handout from pre-op Bariatric Education Class:  Slowly increase the amount of protein shake you drink as tolerated  You may find it easier to slowly sip shakes throughout the day  It is important to get your proteins in first  Your fluid goal is to drink 64 - 100 ounces of fluid daily  It may take a few weeks to build up to this  32 oz (or more) should be clear liquids  And  32 oz (or more) should be full liquids (see below for examples)  Liquids should not contain sugar, caffeine, or carbonation   Clear Liquids:  Water or Sugar-free flavored water (i.e. Fruit H2O, Propel)  Decaffeinated coffee or tea (sugar-free)  Crystal Lite, Wyler's Lite, Minute Maid Lite  Sugar-free Jell-O  Bouillon or broth  Sugar-free Popsicle: *Less than 20 calories each; Limit 1 per day   Full Liquids:  Protein Shakes/Drinks + 2 choices per day of other full liquids  Full liquids must be:  No More Than 12 grams of Carbs per serving  No More Than 3 grams of Fat per serving  Strained low-fat cream soup  Non-Fat milk  Fat-free Lactaid Milk  Sugar-free yogurt (Dannon Lite & Fit, Greek yogurt)     Clayton Bibles, MS, RD, LDN Pager: [phone] After Hours Pager: [phone]

## 2016-04-11 NOTE — Progress Notes (Signed)
RT placed patient on CPAP. Patient setting is auto titrate 5-20 cmH2O. Sterile water added to water chamber for humidification. Patient seems to be tolerating well. RT will continue to monitor.

## 2016-04-11 NOTE — Progress Notes (Signed)
Patient alert and oriented, Post op day 1.  Provided support and encouragement.  Encouraged pulmonary toilet, ambulation and small sips of liquids.  All questions answered.  Will continue to monitor. 

## 2016-04-12 LAB — CBC WITH DIFFERENTIAL/PLATELET
Basophils Absolute: 0.1 10*3/uL (ref 0.0–0.1)
Basophils Relative: 1 %
EOS ABS: 0.4 10*3/uL (ref 0.0–0.7)
EOS PCT: 4 %
HCT: 41.9 % (ref 36.0–46.0)
HEMOGLOBIN: 14.8 g/dL (ref 12.0–15.0)
LYMPHS PCT: 19 %
Lymphs Abs: 2 10*3/uL (ref 0.7–4.0)
MCH: 31.6 pg (ref 26.0–34.0)
MCHC: 35.3 g/dL (ref 30.0–36.0)
MCV: 89.3 fL (ref 78.0–100.0)
MONOS PCT: 12 %
Monocytes Absolute: 1.3 10*3/uL — ABNORMAL HIGH (ref 0.1–1.0)
Neutro Abs: 6.6 10*3/uL (ref 1.7–7.7)
Neutrophils Relative %: 64 %
PLATELETS: 188 10*3/uL (ref 150–400)
RBC: 4.69 MIL/uL (ref 3.87–5.11)
RDW: 12.5 % (ref 11.5–15.5)
WBC: 10.3 10*3/uL (ref 4.0–10.5)

## 2016-04-12 LAB — GLUCOSE, CAPILLARY
GLUCOSE-CAPILLARY: 138 mg/dL — AB (ref 65–99)
GLUCOSE-CAPILLARY: 165 mg/dL — AB (ref 65–99)
Glucose-Capillary: 186 mg/dL — ABNORMAL HIGH (ref 65–99)
Glucose-Capillary: 187 mg/dL — ABNORMAL HIGH (ref 65–99)
Glucose-Capillary: 205 mg/dL — ABNORMAL HIGH (ref 65–99)

## 2016-04-12 MED ORDER — FELODIPINE ER 5 MG PO TB24
5.0000 mg | ORAL_TABLET | Freq: Every day | ORAL | Status: DC
  Administered 2016-04-12 – 2016-04-13 (×2): 5 mg via ORAL
  Filled 2016-04-12 (×2): qty 1

## 2016-04-12 MED ORDER — CLOPIDOGREL BISULFATE 75 MG PO TABS
75.0000 mg | ORAL_TABLET | Freq: Every morning | ORAL | Status: DC
  Administered 2016-04-12 – 2016-04-13 (×2): 75 mg via ORAL
  Filled 2016-04-12 (×2): qty 1

## 2016-04-12 NOTE — Progress Notes (Addendum)
Patient ID: PHOEBEE AUTREY, female   DOB: July 19, 1966, 50 y.o.   MRN: [id number]  Progress Note: Metabolic and Bariatric Surgery Service   Subjective: Much less nausea. Last night was a lot better. Walked. Took in some chicken soup shake. Less burping  Objective: Vital signs in last 24 hours: Temp:  [98.7 F (37.1 C)-99.4 F (37.4 C)] 98.7 F (37.1 C) (09/20 0452) Pulse Rate:  [76-87] 76 (09/20 0452) Resp:  [16-19] 16 (09/20 0452) BP: (136-180)/(64-88) 136/73 (09/20 0452) SpO2:  [96 %-100 %] 96 % (09/20 0452) Last BM Date: 04/09/16  Intake/Output from previous day: 09/19 0701 - 09/20 0700 In: 2958.8 [P.O.:65; I.V.:2793.8; IV Piggyback:100] Out: 1900 [Urine:1900] Intake/Output this shift: No intake/output data recorded.  Lungs: cta  Cardiovascular: reg  Abd: obese, soft, incisions c/d/i, min TTP  Extremities: no edema  Neuro: alert, not ill appearing; sitting on bedside bench  Lab Results: CBC   Recent Labs  04/11/16 0456 04/11/16 1622 04/12/16 0502  WBC 12.6*  --  10.3  HGB 14.3 14.8 14.8  HCT 41.1 43.0 41.9  PLT 208  --  188   BMET  Recent Labs  04/10/16 0543 04/11/16 0456  NA 136 137  K 4.0 4.6  CL 107 106  CO2 22 24  GLUCOSE 213* 164*  BUN 19 8  CREATININE 0.57 0.48  CALCIUM 9.9 9.6   PT/INR No results for input(s): LABPROT, INR in the last 72 hours. ABG No results for input(s): PHART, HCO3 in the last 72 hours.  Invalid input(s): PCO2, PO2  Studies/Results:  Anti-infectives: Anti-infectives    Start     Dose/Rate Route Frequency Ordered Stop   04/10/16 0510  cefoTEtan in Dextrose 5% (CEFOTAN) IVPB 2 g     2 g Intravenous On call to O.R. 04/10/16 [id number] 04/10/16 0748      Medications: Scheduled Meds: . aspirin EC  81 mg Oral q morning - 10a  . clopidogrel  75 mg Oral q morning - 10a  . enoxaparin (LOVENOX) injection  30 mg Subcutaneous Q12H  . famotidine (PEPCID) IV  20 mg Intravenous Q12H  . felodipine  5 mg Oral QAC breakfast  .  insulin aspart  0-20 Units Subcutaneous Q4H  . irbesartan  300 mg Oral Daily  . lamoTRIgine  100 mg Oral q morning - 10a  . levETIRAcetam  250 mg Oral BID  . protein supplement shake  2 oz Oral Q2H   Continuous Infusions: . 0.45 % NaCl with KCl 20 mEq / L 125 mL/hr at 04/12/16 0143   PRN Meds:.oxyCODONE **AND** acetaminophen, acetaminophen (TYLENOL) oral liquid 160 mg/5 mL, diphenhydrAMINE, enalaprilat, fentaNYL (SUBLIMAZE) injection, LORazepam, ondansetron (ZOFRAN) IV, promethazine, simethicone  Assessment/Plan: Patient Active Problem List   Diagnosis Date Noted  . Morbid obesity with BMI of 50.0-59.9, adult ([place]) 04/10/2016  . GERD (gastroesophageal reflux disease) 04/10/2016  . History of CVA (cerebrovascular accident) without residual deficits 04/10/2016  . Diabetes mellitus type 2 in obese ([place]) 04/10/2016  . OSA on CPAP 04/10/2016  . 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 Carotid stenosis, symptomatic w/o infarct 10/14/2014  . Abnormal stress test   . Stenosis of artery ([place])   . CVA (cerebral vascular accident) ([place]) 06/12/2013  . HTN (hypertension) 06/12/2013  . Hypertriglyceridemia 06/12/2013  . PFO (patent foramen ovale) 06/12/2013   s/p Procedure(s): LAPAROSCOPIC GASTRIC SLEEVE RESECTION WITH UPPER ENDO 04/10/2016  HTN - restart felodipine, cont ARB H/o cva, intracranial atherosclerosis - cont ASA, restart plavix, hgb stable dvt  prophylaxis - cont lovenox, scds, ambulate  Will see how does with oral intake this am. Still needs to improve before safe for dc Discussed importance of ambulation at home, avoiding smoking Disposition:  LOS: 2 days  The patient does not meet criteria for discharge because she did not meet oral intake goals and is at high risk of dehydration.    [name], MD [phone] [facility]

## 2016-04-13 LAB — CBC
HCT: 37.9 % (ref 36.0–46.0)
HEMOGLOBIN: 13.1 g/dL (ref 12.0–15.0)
MCH: 30.3 pg (ref 26.0–34.0)
MCHC: 34.6 g/dL (ref 30.0–36.0)
MCV: 87.7 fL (ref 78.0–100.0)
PLATELETS: 207 10*3/uL (ref 150–400)
RBC: 4.32 MIL/uL (ref 3.87–5.11)
RDW: 12.2 % (ref 11.5–15.5)
WBC: 7.7 10*3/uL (ref 4.0–10.5)

## 2016-04-13 LAB — GLUCOSE, CAPILLARY
GLUCOSE-CAPILLARY: 135 mg/dL — AB (ref 65–99)
GLUCOSE-CAPILLARY: 178 mg/dL — AB (ref 65–99)
GLUCOSE-CAPILLARY: 188 mg/dL — AB (ref 65–99)
Glucose-Capillary: 150 mg/dL — ABNORMAL HIGH (ref 65–99)

## 2016-04-13 MED ORDER — OXYCODONE HCL 5 MG/5ML PO SOLN: 5.0000 mg | ORAL | 0 refills | Status: DC | PRN

## 2016-04-13 NOTE — Progress Notes (Signed)
Patient alert and oriented, pain is controlled. Patient is tolerating fluids, advanced to protein shake today, patient is tolerating well.  Reviewed Gastric sleeve discharge instructions with patient and patient is able to articulate understanding.  Provided information on BELT program, Support Group and [facility] outpatient pharmacy. All questions answered, will continue to monitor.  

## 2016-04-13 NOTE — Progress Notes (Signed)
Discharge instructions discussed with patient and daughter, verbalized understanding and agreement

## 2016-04-13 NOTE — Progress Notes (Signed)
Patient drinking Chicken Soup Unjury shake in place of Premier Protein shake

## 2016-04-14 NOTE — Discharge Summary (Signed)
Physician Discharge Summary  Jill [name] [id number] DOB: February 26, 1966 DOA: 04/10/2016  PCP: Maggie Font, MD  Admit date: 04/10/2016 Discharge date: 04/13/2016  Recommendations for Outpatient Follow-up:   Follow-up Information    [name], MD. Go on 04/25/2016.   Specialty:  General Surgery Why:  at 4:00 PM for post-op check Contact information: [address] [place] [address] [phone]          Discharge Diagnoses:  Principal Problem:   Morbid obesity with BMI of 50.0-59.9, adult ([place]) Active Problems:   HTN (hypertension)   Hypertriglyceridemia   GERD (gastroesophageal reflux disease)   History of CVA (cerebrovascular accident) without residual deficits   Diabetes mellitus type 2 in obese (HCC)   OSA on CPAP   S/P laparoscopic sleeve gastrectomy   Surgical Procedure: Laparoscopic Sleeve Gastrectomy, upper endoscopy  Discharge Condition: Good Disposition: Home  Diet recommendation: Postoperative sleeve gastrectomy diet (liquids only)  Filed Weights   04/10/16 0517 04/11/16 0740  Weight: (!) 143.4 kg (316 lb 2 oz) (!) 144.7 kg (319 lb 1.6 oz)     Hospital Course:  The patient was admitted for a planned laparoscopic sleeve gastrectomy. Please see operative note. Preoperatively the patient was given 5000 units of subcutaneous heparin for DVT prophylaxis. She received 1 dose of 5000 units of subcutaneous heparin on the evening of postoperative day 0. Postoperative prophylactic Lovenox dosing was started on the morning of postoperative day 1. The patient was started on ice chips and water which she had trouble tolerating.. On postoperative day 2 The patient's diet was advanced to protein shakes however her volume of oral intake was not sufficient or safe for discharge so she was kept an additional night. On postoperative day 3 her liquid intake was doing better. She had much less belching. The patient was ambulating without difficulty. Their vital  signs are stable without fever or tachycardia. Their hemoglobin had remained stable. The patient was maintained on their home settings for CPAP therapy. The patient had received discharge instructions and counseling. They were deemed stable for discharge. She was counseled extensively about not smoking upon returning home. We discussed at length the potential risk and complications. She was maintained on her aspirin throughout her hospitalization including the day of surgery. Plavix was restarted on postoperative day 2. Her hemoglobin remained stable.  BP (!) 157/65 (BP Location: Right Arm)   Pulse 71   Temp 98.7 F (37.1 C) (Oral)   Resp 16   Ht 5\' 6"  (1.676 m)   Wt (!) 144.7 kg (319 lb 1.6 oz)   LMP 12/10/2014 Comment: pt states that there is no chance or pregnancy  SpO2 100%   BMI 51.50 kg/m   Gen: alert, NAD, non-toxic appearing Pupils: equal, no scleral icterus Pulm: Lungs clear to auscultation, symmetric chest rise CV: regular rate and rhythm Abd: soft, nontender, nondistended.  No cellulitis. No incisional hernia Ext: no edema, no calf tenderness Skin: no rash, no jaundice    Discharge Instructions  Discharge Instructions    Ambulate hourly while awake    Complete by:  As directed    Call MD for:  difficulty breathing, headache or visual disturbances    Complete by:  As directed    Call MD for:  persistant dizziness or light-headedness    Complete by:  As directed    Call MD for:  persistant nausea and vomiting    Complete by:  As directed    Call MD for:  redness, tenderness, or signs of infection (pain, swelling, redness, odor or green/yellow discharge around incision site)    Complete by:  As directed    Call MD for:  severe uncontrolled pain    Complete by:  As directed    Call MD for:  temperature >101 F    Complete by:  As directed    Diet bariatric full liquid    Complete by:  As directed    Discharge instructions    Complete by:  As directed    See  bariatric discharge instructions   Incentive spirometry    Complete by:  As directed    Perform hourly while awake       Medication List    STOP taking these medications   metFORMIN 1000 MG tablet Commonly known as:  GLUCOPHAGE     TAKE these medications   acetaminophen 500 MG tablet Commonly known as:  TYLENOL Take 1,000 mg by mouth every 8 (eight) hours as needed for moderate pain. For pain   aspirin EC 81 MG tablet Take 81 mg by mouth every morning.   clopidogrel 75 MG tablet Commonly known as:  PLAVIX Take 75 mg by mouth every morning.   docusate sodium 100 MG capsule Commonly known as:  COLACE Take 100 mg by mouth daily.   felodipine 5 MG 24 hr tablet Commonly known as:  PLENDIL Take 5 mg by mouth daily before breakfast. Notes to patient:  Monitor Blood Pressure Daily and keep a log for primary care physician.  You may need to make changes to your medications with rapid weight loss.     furosemide 80 MG tablet Commonly known as:  LASIX Take 0.5 tablets (40 mg total) by mouth daily. Notes to patient:  Monitor Blood Pressure Daily and keep a log for primary care physician.  Monitor for symptoms of dehydration.  You may need to make changes to your medications with rapid weight loss.     gabapentin 300 MG capsule Commonly known as:  NEURONTIN Take 300 mg by mouth daily.   insulin aspart 100 UNIT/ML injection Commonly known as:  novoLOG Inject 20 Units into the skin at bedtime. Notes to patient:  Monitor Blood Sugar Frequently and keep a log for primary care physician, you may need to adjust medication dosage with rapid weight loss.     lamoTRIgine 100 MG tablet Commonly known as:  LAMICTAL Take 100 mg by mouth every morning.   levETIRAcetam 500 MG tablet Commonly known as:  KEPPRA Take 250 mg by mouth every 12 (twelve) hours.   LORazepam 1 MG tablet Commonly known as:  ATIVAN Take 1 mg by mouth daily as needed for anxiety.   multivitamin with minerals  Tabs tablet Take 1 tablet by mouth every morning.   olmesartan 40 MG tablet Commonly known as:  BENICAR Take 40 mg by mouth every morning. Notes to patient:  Monitor Blood Pressure Daily and keep a log for primary care physician.  You may need to make changes to your medications with rapid weight loss.     oxyCODONE 5 MG/5ML solution Commonly known as:  ROXICODONE Take 5-10 mLs (5-10 mg total) by mouth every 4 (four) hours as needed for moderate pain or severe pain.   pioglitazone 15 MG tablet Commonly known as:  ACTOS Take 15 mg by mouth every morning. Notes to patient:  Monitor Blood Sugar Frequently and keep a log for primary care physician, you may need to adjust medication dosage with rapid  weight loss.     potassium chloride 10 MEQ tablet Commonly known as:  K-DUR Take 20 mEq by mouth daily.   ranitidine 150 MG tablet Commonly known as:  ZANTAC Take 1 tablet (150 mg total) by mouth 2 (two) times daily.   rosuvastatin 20 MG tablet Commonly known as:  CRESTOR Take 20 mg by mouth every morning.   TOUJEO SOLOSTAR 300 UNIT/ML Sopn Generic drug:  Insulin Glargine Take 60 Units by mouth at bedtime. Notes to patient:  Per patient PCP discontinued at surgery      Follow-up Pierce, MD. Go on 04/25/2016.   Specialty:  General Surgery Why:  at 4:00 PM for post-op check Contact information: [place]  [address] [phone]            The results of significant diagnostics from this hospitalization (including imaging, microbiology, ancillary and laboratory) are listed below for reference.    Significant Diagnostic Studies: No results found.  Labs: Basic Metabolic Panel:  Recent Labs Lab 04/10/16 0543 04/11/16 0456  NA 136 137  K 4.0 4.6  CL 107 106  CO2 22 24  GLUCOSE 213* 164*  BUN 19 8  CREATININE 0.57 0.48  CALCIUM 9.9 9.6   Liver Function Tests:  Recent Labs Lab 04/10/16 0543 04/11/16 0456  AST  21 42*  ALT 20 41  ALKPHOS 71 78  BILITOT 0.5 0.8  PROT 7.0 7.3  ALBUMIN 3.7 3.8    CBC:  Recent Labs Lab 04/10/16 0928 04/11/16 0456 04/11/16 1622 04/12/16 0502 04/13/16 0502  WBC  --  12.6*  --  10.3 7.7  NEUTROABS  --  9.2*  --  6.6  --   HGB 13.0 14.3 14.8 14.8 13.1  HCT 37.3 41.1 43.0 41.9 37.9  MCV  --  88.8  --  89.3 87.7  PLT  --  208  --  188 207    CBG:  Recent Labs Lab 04/12/16 2000 04/13/16 0013 04/13/16 0703 04/13/16 0753 04/13/16 1132  GLUCAP 165* 135* 178* 188* 150*    Principal Problem:   Morbid obesity with BMI of 50.0-59.9, adult ([place]) Active Problems:   HTN (hypertension)   Hypertriglyceridemia   GERD (gastroesophageal reflux disease)   History of CVA (cerebrovascular accident) without residual deficits   Diabetes mellitus type 2 in obese (HCC)   OSA on CPAP   S/P laparoscopic sleeve gastrectomy   Time coordinating discharge: 20 minutes   Signed:  [name], MD [facility], [place] [phone] 04/14/2016, 8:53 AM

## 2016-04-25 ENCOUNTER — Ambulatory Visit: Payer: Self-pay

## 2016-04-28 ENCOUNTER — Ambulatory Visit (INDEPENDENT_AMBULATORY_CARE_PROVIDER_SITE_OTHER): Payer: BLUE CROSS/BLUE SHIELD | Admitting: *Deleted

## 2016-04-28 VITALS — BP 110/71 | HR 87

## 2016-04-28 DIAGNOSIS — R002 Palpitations: Secondary | ICD-10-CM | POA: Diagnosis not present

## 2016-04-28 NOTE — Progress Notes (Signed)
Patient walked into office with c/o lightheadedness, dizziness x last 3 days.  Nausea & palpitations most recently at [facility]  No chest pain.  Stated that she has lost about 25 pounds since last OV with Dr. Harl Bowie due to having gastric sleeve done recently.  Patient is diabetic, but did not feel like this was related to her sugar dropping.  EKG was done - showed NSR with HR of 83.  (will be scanned into EPIC).  Vitals also good.  Advised her to keep log of BP readings till she comes in for OV on 05/08/2016.  Also, suggested that she could take them lying, sitting, & standing if she feels this with changing positions.

## 2016-05-02 ENCOUNTER — Encounter: Payer: BLUE CROSS/BLUE SHIELD | Attending: General Surgery | Admitting: Dietician

## 2016-05-02 DIAGNOSIS — E119 Type 2 diabetes mellitus without complications: Secondary | ICD-10-CM | POA: Insufficient documentation

## 2016-05-02 DIAGNOSIS — E7800 Pure hypercholesterolemia, unspecified: Secondary | ICD-10-CM | POA: Insufficient documentation

## 2016-05-02 DIAGNOSIS — G4733 Obstructive sleep apnea (adult) (pediatric): Secondary | ICD-10-CM | POA: Insufficient documentation

## 2016-05-02 DIAGNOSIS — I10 Essential (primary) hypertension: Secondary | ICD-10-CM | POA: Diagnosis not present

## 2016-05-02 DIAGNOSIS — Z720 Tobacco use: Secondary | ICD-10-CM | POA: Diagnosis not present

## 2016-05-02 DIAGNOSIS — Z8673 Personal history of transient ischemic attack (TIA), and cerebral infarction without residual deficits: Secondary | ICD-10-CM | POA: Diagnosis not present

## 2016-05-02 DIAGNOSIS — K219 Gastro-esophageal reflux disease without esophagitis: Secondary | ICD-10-CM | POA: Insufficient documentation

## 2016-05-02 DIAGNOSIS — Z6841 Body Mass Index (BMI) 40.0 and over, adult: Secondary | ICD-10-CM

## 2016-05-03 NOTE — Progress Notes (Signed)
Bariatric Class:  Appt start time: 1530 end time:  1630.  2 Week Post-Operative Nutrition Class  Patient was seen on 05/02/2016 for Post-Operative Nutrition education at [facility].   Surgery date: 02/14/2016 Surgery type: sleeve gastrectomy Start weight at [facility]: 320.5 lbs on 10/13/2015 Weight today: 295 lbs  Weight change: 24.8 lbs  TANITA  BODY COMP RESULTS  01/31/16 05/02/16   BMI (kg/m^2) N/A 46.2   Fat Mass (lbs)  163.4   Fat Free Mass (lbs)  131.6   Total Body Water (lbs)  98.0    The following the learning objectives were met by the patient during this course:  Identifies Phase 3A (Soft, High Proteins) Dietary Goals and will begin from 2 weeks post-operatively to 2 months post-operatively  Identifies appropriate sources of fluids and proteins   States protein recommendations and appropriate sources post-operatively  Identifies the need for appropriate texture modifications, mastication, and bite sizes when consuming solids  Identifies appropriate multivitamin and calcium sources post-operatively  Describes the need for physical activity post-operatively and will follow MD recommendations  States when to call healthcare provider regarding medication questions or post-operative complications  Handouts given during class include:  Phase 3A: Soft, High Protein Diet Handout  Follow-Up Plan: Patient will follow-up at [facility] in 6 weeks for 2 month post-op nutrition visit for diet advancement per MD.

## 2016-05-03 NOTE — Progress Notes (Signed)
Left message to return call 

## 2016-05-03 NOTE — Progress Notes (Signed)
[name], MD sent to Laurine Blazer, LPN    Can we see how she did over the weekend?   Zandra Abts MD

## 2016-05-03 NOTE — Progress Notes (Signed)
Patient returned call - stated she is about the same, no worse.  Does already have OV scheduled for 05/08/2016 with Dr. Harl Bowie.

## 2016-05-08 ENCOUNTER — Ambulatory Visit (INDEPENDENT_AMBULATORY_CARE_PROVIDER_SITE_OTHER): Payer: BLUE CROSS/BLUE SHIELD | Admitting: Cardiology

## 2016-05-08 VITALS — BP 168/84 | HR 133 | Ht 67.0 in | Wt 290.0 lb

## 2016-05-08 DIAGNOSIS — R42 Dizziness and giddiness: Secondary | ICD-10-CM | POA: Diagnosis not present

## 2016-05-08 MED ORDER — FUROSEMIDE 40 MG PO TABS: 40.0000 mg | ORAL_TABLET | Freq: Every day | ORAL | 2 refills | Status: DC | PRN

## 2016-05-08 NOTE — Progress Notes (Signed)
Clinical Summary Jill [name] is a 50 y.o.female see today for follow up of the following medical problems. This is a focused visit on recent symptoms of dizziness.    1. Dizziness - only occurs with standing. Feeling of heart racing, lightheaded, dizzy. Better with sitting down or laying down. - started about 2.5 weeks ago.  - drinks 1 quart of fluids a day. Takes her lasix daily.                                                                                                                        2. Obesity - recent bariatric surgery, over 30 lbs weight loss since that time.                                                                                                                                                                                                                                                                                                                                       Past Medical History:  Diagnosis Date  . Anxiety   . Back pain    complains when laying on hard flat surface  . Chronic kidney disease    appt. /w urology 11/10/2014- for a cyst seen on [place]. evaluated was told it was nothing.  . Diabetes mellitus    Diabetes II, oral and insulin.  Marland Kitchen DVT (deep venous thrombosis) ([place])  right leg '09 or '10  . Fibroids   . GERD (gastroesophageal reflux disease)   . Heart murmur   . History of stress test    referred for cardiac cath- done here at [facility]- 09/2014  . Hypercholesterolemia   . Hypertension   . Seizures ([place])    04/2010 x1- Dr. Colan Neptune x2 yearly.  . Sleep apnea    CPAP q night , last study > 3 yrs. ago  . 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 strangle, occ left mouth droop"mild"." may have a tendency to smile or cry with no reason"     Allergies  Allergen Reactions  . Dilaudid [Hydromorphone Hcl] Nausea And Vomiting  . Contrast Media [Iodinated Diagnostic Agents]  Nausea And Vomiting    "contrast dye vs dilaudid gave pt N&V with last procedure" PT WAS GIVEN IV CONTRAST ON 08/02/2015 WITHOUT ANY REACTION TO CONTRAST.  Otho Darner Allergy] Swelling  . Other Other (See Comments)    "pt is a difficult IV stick, prefers IV team paged for IV starts"     Current Outpatient Prescriptions  Medication Sig Dispense Refill  . acetaminophen (TYLENOL) 500 MG tablet Take 1,000 mg by mouth every 8 (eight) hours as needed for moderate pain. For pain     . aspirin EC 81 MG tablet Take 81 mg by mouth every morning.    . clopidogrel (PLAVIX) 75 MG tablet Take 75 mg by mouth every morning.    . docusate sodium (COLACE) 100 MG capsule Take 100 mg by mouth daily.    . felodipine (PLENDIL) 5 MG 24 hr tablet Take 5 mg by mouth daily before breakfast.     . furosemide (LASIX) 80 MG tablet Take 0.5 tablets (40 mg total) by mouth daily.    Marland Kitchen gabapentin (NEURONTIN) 300 MG capsule Take 300 mg by mouth daily.   0  . insulin aspart (NOVOLOG) 100 UNIT/ML injection Inject 20 Units into the skin at bedtime.     . lamoTRIgine (LAMICTAL) 100 MG tablet Take 100 mg by mouth every morning.     . levETIRAcetam (KEPPRA) 500 MG tablet Take 250 mg by mouth every 12 (twelve) hours.    Marland Kitchen LORazepam (ATIVAN) 1 MG tablet Take 1 mg by mouth daily as needed for anxiety.    . Multiple Vitamin (MULITIVITAMIN WITH MINERALS) TABS Take 1 tablet by mouth every morning.     . olmesartan (BENICAR) 40 MG tablet Take 40 mg by mouth every morning.     Marland Kitchen oxyCODONE (ROXICODONE) 5 MG/5ML solution Take 5-10 mLs (5-10 mg total) by mouth every 4 (four) hours as needed for moderate pain or severe pain.  0  . pioglitazone (ACTOS) 15 MG tablet Take 15 mg by mouth every morning.     . potassium chloride (K-DUR) 10 MEQ tablet Take 20 mEq by mouth daily.  0  . ranitidine (ZANTAC) 150 MG tablet Take 1 tablet (150 mg total) by mouth 2 (two) times daily.    . rosuvastatin (CRESTOR) 20 MG tablet Take 20 mg by mouth every  morning.     Marland Kitchen TOUJEO SOLOSTAR 300 UNIT/ML SOPN Take 60 Units by mouth at bedtime.   0   No current facility-administered medications for this visit.      Past Surgical History:  Procedure Laterality Date  . APPENDECTOMY    . CARDIAC CATHETERIZATION    . [place]  x2  . CHOLECYSTECTOMY     open"gallstones"  . INTERVENTIONAL RADIOLOGY PROCEDURE     CT angiography /neck -Dr. Estanislado Pandy.  Marland Kitchen LAPAROSCOPIC GASTRIC SLEEVE RESECTION N/A 04/10/2016   Procedure: LAPAROSCOPIC GASTRIC SLEEVE RESECTION WITH UPPER ENDO;  Surgeon: Greer Pickerel, MD;  Location: [facility] ORS;  Service: General;  Laterality: N/A;  . LEFT HEART CATHETERIZATION WITH CORONARY ANGIOGRAM N/A 09/28/2014   Procedure: LEFT HEART CATHETERIZATION WITH CORONARY ANGIOGRAM;  Surgeon: Jettie Booze, MD;  Location: [facility] CATH LAB;  Service: Cardiovascular;  Laterality: N/A;  . RADIOLOGY WITH ANESTHESIA N/A 10/14/2014   Procedure: ANGIOPLASTY;  Surgeon: Luanne Bras, MD;  Location: [place];  Service: Radiology;  Laterality: N/A;  . TUBAL LIGATION       Allergies  Allergen Reactions  . Dilaudid [Hydromorphone Hcl] Nausea And Vomiting  . Contrast Media [Iodinated Diagnostic Agents] Nausea And Vomiting    "contrast dye vs dilaudid gave pt N&V with last procedure" PT WAS GIVEN IV CONTRAST ON 08/02/2015 WITHOUT ANY REACTION TO CONTRAST.  Otho Darner Allergy] Swelling  . Other Other (See Comments)    "pt is a difficult IV stick, prefers IV team paged for IV starts"      Family History  Problem Relation Age of Onset  . Heart failure Father   . Heart disease Father   . Heart attack Father     30's  . Kidney disease Mother     dialysis for 26 years  . Heart attack Mother     62's  . Stroke Paternal Grandmother      Social History Jill [name] reports that she has been smoking Cigarettes.  She started smoking about 40 years ago. She has a 5.00 pack-year smoking history. She has never used smokeless  tobacco. Jill [name] reports that she does not drink alcohol.   Review of Systems CONSTITUTIONAL: No weight loss, fever, chills, weakness or fatigue.  HEENT: Eyes: No visual loss, blurred vision, double vision or yellow sclerae.No hearing loss, sneezing, congestion, runny nose or sore throat.  SKIN: No rash or itching.  CARDIOVASCULAR: per HPI RESPIRATORY: No shortness of breath, cough or sputum.  GASTROINTESTINAL: No anorexia, nausea, vomiting or diarrhea. No abdominal pain or blood.  GENITOURINARY: No burning on urination, no polyuria NEUROLOGICAL: No headache, dizziness, syncope, paralysis, ataxia, numbness or tingling in the extremities. No change in bowel or bladder control.  MUSCULOSKELETAL: No muscle, back pain, joint pain or stiffness.  LYMPHATICS: No enlarged nodes. No history of splenectomy.  PSYCHIATRIC: No history of depression or anxiety.  ENDOCRINOLOGIC: No reports of sweating, cold or heat intolerance. No polyuria or polydipsia.  Marland Kitchen   Physical Examination  Vitals:   05/08/16 1432  Weight: 290 lb (131.5 kg)  Height: 5\' 7"  (1.702 m)    Gen: resting comfortably, no acute distress HEENT: no scleral icterus, pupils equal round and reactive, no palptable cervical adenopathy,  CV:RRR, no m/r/g, no jvd Resp: Clear to auscultation bilaterally GI: abdomen is soft, non-tender, non-distended, normal bowel sounds, no hepatosplenomegaly MSK: extremities are warm, no edema.  Skin: warm, no rash Neuro:  no focal deficits Psych: appropriate affect   Diagnostic Studies 08/2014 Lexiscan MPI IMPRESSION: 1. Large area of ischemia extending from the basal to distal anteroseptal wall.  2. Normal left ventricular wall motion.  3. Left ventricular ejection fraction 60 for%  4. High-risk stress test findings*.  09/2014 Cath HEMODYNAMICS: Aortic pressure was 142/66; LV pressure was 144/7; LVEDP 18. There was no gradient between the left ventricle  and aorta.    ANGIOGRAPHIC DATA: The left main coronary artery is widely patent.  The left anterior descending artery is a large vessel which wraps around the apex. There is mild atherosclerosis in the mid vessel. There is a large diagonal vessel which arises proximally. There are several small diagonals which are patent.  The left circumflex artery is a large dominant vessel. There is a ramus vessel which is large and widely patent. There is mild disease in the circumflex system. The first obtuse marginal is small but patent. There is a small left PDA which is patent.  The right coronary artery is a nondominant vessel. In the mid vessel, there is moderate disease.  LEFT VENTRICULOGRAM: Left ventricular angiogram was not done due to the question of a dye allergy. LVEDP was 18 mmHg.  IMPRESSIONS:  1. Normal left main coronary artery. 2. Mild disease in the left anterior descending artery and its branches. 3. Mild disease in the dominant left circumflex artery and its branches. 4. Moderate disease in the nondominant right coronary artery. 5. Left ventricular systolic function not assessed. LVEDP 18 mmHg. 6. False positive nuclear stress test.  RECOMMENDATION: Continue aggressive preventive therapy given her history of peripheral vascular disease and multiple risk factors for coronary atherosclerosis. She will follow-up with Dr. Harl Bowie.       Assessment and Plan  1. Dizziness - severe dizziness with standing, she is severely orthostatic in clinic today by pulse. HR 89 lying up to 133 with standing and she is symptomatic - suspect with rapid weight loss and decreased fluid absorption from gastric surgery, her diuretic is too potent for her now. We will change lasix to prn only and monitor symptoms. Continue oral hydation   F/u 3 months      [name], M.D., F.A.C.C.

## 2016-05-08 NOTE — Patient Instructions (Signed)
Medication Instructions:   Decrease Lasix to 40mg  daily as needed swelling.  Continue all other medications.    Labwork: none  Testing/Procedures: none  Follow-Up: 3 months   Any Other Special Instructions Will Be Listed Below (If Applicable). Call the office in 1 week with update on symptoms.   If you need a refill on your cardiac medications before your next appointment, please call your pharmacy.

## 2016-05-09 ENCOUNTER — Telehealth: Payer: Self-pay | Admitting: Cardiology

## 2016-05-09 NOTE — Telephone Encounter (Signed)
Yes, only take potassium on days she takes her lasix  J Kabrina Christiano MD

## 2016-05-09 NOTE — Telephone Encounter (Signed)
Pt aware.

## 2016-05-09 NOTE — Telephone Encounter (Signed)
Mrs. Jill [name] called stating that since she has discontinued her Lasix does she need to continue taking Potassium?

## 2016-05-09 NOTE — Telephone Encounter (Signed)
Would assume since only taking lasix prn that would only take KCl when taking lasix. Will forward to provider to confirm

## 2016-05-10 ENCOUNTER — Encounter: Payer: Self-pay | Admitting: Cardiology

## 2016-06-13 ENCOUNTER — Encounter: Payer: Self-pay | Admitting: Dietician

## 2016-06-13 ENCOUNTER — Encounter: Payer: BLUE CROSS/BLUE SHIELD | Attending: General Surgery | Admitting: Dietician

## 2016-06-13 ENCOUNTER — Other Ambulatory Visit (HOSPITAL_COMMUNITY): Payer: Self-pay | Admitting: Interventional Radiology

## 2016-06-13 DIAGNOSIS — Z8673 Personal history of transient ischemic attack (TIA), and cerebral infarction without residual deficits: Secondary | ICD-10-CM | POA: Insufficient documentation

## 2016-06-13 DIAGNOSIS — I10 Essential (primary) hypertension: Secondary | ICD-10-CM | POA: Insufficient documentation

## 2016-06-13 DIAGNOSIS — Z720 Tobacco use: Secondary | ICD-10-CM | POA: Diagnosis not present

## 2016-06-13 DIAGNOSIS — I771 Stricture of artery: Secondary | ICD-10-CM

## 2016-06-13 DIAGNOSIS — K219 Gastro-esophageal reflux disease without esophagitis: Secondary | ICD-10-CM | POA: Diagnosis not present

## 2016-06-13 DIAGNOSIS — I639 Cerebral infarction, unspecified: Secondary | ICD-10-CM

## 2016-06-13 DIAGNOSIS — G4733 Obstructive sleep apnea (adult) (pediatric): Secondary | ICD-10-CM | POA: Diagnosis not present

## 2016-06-13 DIAGNOSIS — E119 Type 2 diabetes mellitus without complications: Secondary | ICD-10-CM | POA: Diagnosis not present

## 2016-06-13 DIAGNOSIS — E7800 Pure hypercholesterolemia, unspecified: Secondary | ICD-10-CM | POA: Insufficient documentation

## 2016-06-13 NOTE — Patient Instructions (Addendum)
Goals:  Follow Phase 3B: High Protein + Non-Starchy Vegetables  Eat 3-6 small meals/snacks, every 3-5 hrs  Increase lean protein foods to meet 60g goal  Increase fluid intake to 64oz +  Avoid drinking 15 minutes before, during and 30 minutes after eating  Aim for >30 min of physical activity daily  See if a small snack before bed helps reduce your blood sugar in the morning  Phase out protein shakes and bars when you're able to eat more protein  Surgery date: 04/10/2016 Surgery type: sleeve gastrectomy Start weight at [facility]: 320.5 lbs on 10/13/2015 Weight today: 279.6 lbs Weight change: 15.4 lbs Total weight loss: 41 lbs  TANITA  BODY COMP RESULTS  01/31/16 05/02/16 06/13/16   BMI (kg/m^2) N/A 46.2 43.8   Fat Mass (lbs)  163.4 147   Fat Free Mass (lbs)  131.6 132.6   Total Body Water (lbs)  98.0 98

## 2016-06-13 NOTE — Progress Notes (Signed)
  Follow-up visit:  8 Weeks Post-Operative Sleeve gastrectomy Surgery  Medical Nutrition Therapy:  Appt start time: 330 end time:  415  Primary concerns today: Post-operative Bariatric Surgery Nutrition Management.  Jill [name] returns having lost a total of 41 pounds. She has been off of Lasix for over a month and not having any swelling! Reduced Glucophage and Toujeo. Takes 5 units of insulin when her blood glucose is high in the mornings. Struggling with heartburn; Zantac was doubled but patient states this did not help. Patient called surgeon office today to report heartburn. Tolerating all recommended foods but notes severe heartburn and acid reflux, heartburn may occur at any time but it is especially bad after eating. Rustie states that she feels like she is chewing well enough; reports that Tums relieve discomfort eventually.     Surgery date: 04/10/2016 Surgery type: sleeve gastrectomy Start weight at [facility]: 320.5 lbs on 10/13/2015 (highest weight 326 lbs) Weight today: 279.6 lbs Weight change: 15.4 lbs Total weight loss: 41 lbs  TANITA  BODY COMP RESULTS  01/31/16 05/02/16 06/13/16   BMI (kg/m^2) N/A 46.2 43.8   Fat Mass (lbs)  163.4 147   Fat Free Mass (lbs)  131.6 132.6   Total Body Water (lbs)  98.0 98    Preferred Learning Style:   No preference indicated   Learning Readiness:   Ready  24-hr recall: B (9-10 AM): Dannon Light and Fit [place] yogurt (12g) Snk (AM):   L (1 PM): 1.5-2 oz chicken, sometimes with green beans (10-14g) Snk (1:30-5 PM): Atkins bar and Atkins shake (35g)  D (PM): sometimes more chicken (10-14g) Snk (PM): protein bar or shake (15-20g)  Goes to bed around 1-4 am  Fluid intake: 34 oz water, 11-22 oz shakes, sugar free popsicle Estimated total protein intake: 82-95 g/day  Medications: see list Supplementation: taking  CBG monitoring: 1x in the morning Average CBG per patient: 150-300 mg/dL Last patient reported A1c: unknown  Using straws:  no Drinking while eating: no Hair loss: unknown Carbonated beverages: took 1 sip to relieve heartburn N/V/D/C: vomiting 1x with medication, constipation  Dumping syndrome: no  Recent physical activity:  Limited due to orthostatic hypotension  Progress Towards Goal(s):  In progress.  Handouts given during visit include:  Phase 3B lean protein + non starchy vegetables   Nutritional Diagnosis:  [place]-3.3 Overweight/obesity related to past poor dietary habits and physical inactivity as evidenced by patient w/ recent sleeve gastrectomy surgery following dietary guidelines for continued weight loss.     Intervention:  Nutrition counseling provided.  Teaching Method Utilized:  Visual Auditory Hands on  Barriers to learning/adherence to lifestyle change: none  Demonstrated degree of understanding via:  Teach Back   Monitoring/Evaluation:  Dietary intake, exercise, and body weight. Follow up in 3 months for 5 month post-op visit.

## 2016-06-22 ENCOUNTER — Ambulatory Visit (HOSPITAL_COMMUNITY)
Admission: RE | Admit: 2016-06-22 | Discharge: 2016-06-22 | Disposition: A | Discharge status: Home / Self Care | Payer: BLUE CROSS/BLUE SHIELD | Source: Ambulatory Visit | Attending: Interventional Radiology | Admitting: Interventional Radiology

## 2016-06-22 ENCOUNTER — Encounter (HOSPITAL_COMMUNITY): Payer: Self-pay

## 2016-06-22 ENCOUNTER — Other Ambulatory Visit (HOSPITAL_COMMUNITY): Payer: Self-pay | Admitting: Family Medicine

## 2016-06-22 DIAGNOSIS — I771 Stricture of artery: Secondary | ICD-10-CM

## 2016-06-22 DIAGNOSIS — I639 Cerebral infarction, unspecified: Secondary | ICD-10-CM

## 2016-06-22 DIAGNOSIS — Z1231 Encounter for screening mammogram for malignant neoplasm of breast: Secondary | ICD-10-CM

## 2016-06-22 MED ORDER — IOPAMIDOL (ISOVUE-370) INJECTION 76%: 100.0000 mL | Freq: Once | INTRAVENOUS | Status: DC | PRN

## 2016-07-03 ENCOUNTER — Ambulatory Visit (HOSPITAL_COMMUNITY)
Admission: RE | Admit: 2016-07-03 | Discharge: 2016-07-03 | Disposition: A | Discharge status: Home / Self Care | Payer: BLUE CROSS/BLUE SHIELD | Source: Ambulatory Visit | Attending: Interventional Radiology | Admitting: Interventional Radiology

## 2016-07-03 ENCOUNTER — Ambulatory Visit (HOSPITAL_COMMUNITY): Admission: RE | Admit: 2016-07-03 | Payer: BLUE CROSS/BLUE SHIELD | Source: Ambulatory Visit

## 2016-07-03 DIAGNOSIS — I771 Stricture of artery: Secondary | ICD-10-CM | POA: Insufficient documentation

## 2016-07-03 DIAGNOSIS — G9389 Other specified disorders of brain: Secondary | ICD-10-CM | POA: Insufficient documentation

## 2016-07-03 DIAGNOSIS — I639 Cerebral infarction, unspecified: Secondary | ICD-10-CM | POA: Insufficient documentation

## 2016-07-03 LAB — POCT I-STAT CREATININE: Creatinine, Ser: 0.5 mg/dL (ref 0.44–1.00)

## 2016-07-03 MED ORDER — IOPAMIDOL (ISOVUE-370) INJECTION 76%
75.0000 mL | Freq: Once | INTRAVENOUS | Status: AC | PRN
  Administered 2016-07-03: 75 mL via INTRAVENOUS

## 2016-07-19 ENCOUNTER — Ambulatory Visit (HOSPITAL_COMMUNITY): Payer: Self-pay

## 2016-07-27 ENCOUNTER — Other Ambulatory Visit (HOSPITAL_COMMUNITY): Payer: Self-pay | Admitting: Interventional Radiology

## 2016-07-27 DIAGNOSIS — I771 Stricture of artery: Secondary | ICD-10-CM

## 2016-08-03 ENCOUNTER — Ambulatory Visit (HOSPITAL_COMMUNITY)
Admission: RE | Admit: 2016-08-03 | Discharge: 2016-08-03 | Disposition: A | Discharge status: Home / Self Care | Payer: BLUE CROSS/BLUE SHIELD | Source: Ambulatory Visit | Attending: Family Medicine | Admitting: Family Medicine

## 2016-08-03 ENCOUNTER — Encounter (HOSPITAL_COMMUNITY): Payer: Self-pay | Admitting: Radiology

## 2016-08-03 ENCOUNTER — Ambulatory Visit (HOSPITAL_COMMUNITY)
Admission: RE | Admit: 2016-08-03 | Discharge: 2016-08-03 | Disposition: A | Discharge status: Home / Self Care | Payer: BLUE CROSS/BLUE SHIELD | Source: Ambulatory Visit | Attending: Interventional Radiology | Admitting: Interventional Radiology

## 2016-08-03 ENCOUNTER — Ambulatory Visit (HOSPITAL_COMMUNITY): Payer: Self-pay

## 2016-08-03 DIAGNOSIS — I771 Stricture of artery: Secondary | ICD-10-CM

## 2016-08-03 DIAGNOSIS — Z1231 Encounter for screening mammogram for malignant neoplasm of breast: Secondary | ICD-10-CM | POA: Insufficient documentation

## 2016-08-03 DIAGNOSIS — I6523 Occlusion and stenosis of bilateral carotid arteries: Secondary | ICD-10-CM | POA: Diagnosis not present

## 2016-08-03 HISTORY — PX: IR GENERIC HISTORICAL: IMG1180011

## 2016-08-07 ENCOUNTER — Encounter (HOSPITAL_COMMUNITY): Payer: Self-pay | Admitting: Interventional Radiology

## 2016-08-21 ENCOUNTER — Encounter: Payer: Self-pay | Admitting: *Deleted

## 2016-08-21 ENCOUNTER — Encounter: Payer: Self-pay | Admitting: Cardiology

## 2016-08-21 ENCOUNTER — Ambulatory Visit (INDEPENDENT_AMBULATORY_CARE_PROVIDER_SITE_OTHER): Payer: BLUE CROSS/BLUE SHIELD | Admitting: Cardiology

## 2016-08-21 VITALS — BP 136/82 | HR 77 | Ht 67.0 in | Wt 268.0 lb

## 2016-08-21 DIAGNOSIS — R42 Dizziness and giddiness: Secondary | ICD-10-CM

## 2016-08-21 DIAGNOSIS — E782 Mixed hyperlipidemia: Secondary | ICD-10-CM | POA: Diagnosis not present

## 2016-08-21 DIAGNOSIS — R0789 Other chest pain: Secondary | ICD-10-CM | POA: Diagnosis not present

## 2016-08-21 DIAGNOSIS — I10 Essential (primary) hypertension: Secondary | ICD-10-CM
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
## 2016-08-21 NOTE — Patient Instructions (Signed)

## 2016-09-11 ENCOUNTER — Ambulatory Visit: Payer: Self-pay | Admitting: Skilled Nursing Facility1

## 2016-09-21 DIAGNOSIS — E7800 Pure hypercholesterolemia, unspecified: Secondary | ICD-10-CM | POA: Diagnosis not present

## 2016-09-21 DIAGNOSIS — E669 Obesity, unspecified: Secondary | ICD-10-CM | POA: Diagnosis not present

## 2016-09-21 DIAGNOSIS — E1169 Type 2 diabetes mellitus with other specified complication: Secondary | ICD-10-CM | POA: Diagnosis not present

## 2016-09-21 DIAGNOSIS — K912 Postsurgical malabsorption, not elsewhere classified: Secondary | ICD-10-CM | POA: Diagnosis not present

## 2016-09-21 DIAGNOSIS — I10 Essential (primary) hypertension: Secondary | ICD-10-CM | POA: Diagnosis not present

## 2016-09-21 DIAGNOSIS — K219 Gastro-esophageal reflux disease without esophagitis: Secondary | ICD-10-CM | POA: Diagnosis not present

## 2016-09-21 DIAGNOSIS — Z9884 Bariatric surgery status: Secondary | ICD-10-CM | POA: Diagnosis not present

## 2016-09-21 DIAGNOSIS — Z8673 Personal history of transient ischemic attack (TIA), and cerebral infarction without residual deficits: Secondary | ICD-10-CM | POA: Diagnosis not present

## 2016-09-21 DIAGNOSIS — F17209 Nicotine dependence, unspecified, with unspecified nicotine-induced disorders: Secondary | ICD-10-CM | POA: Diagnosis not present

## 2016-10-05 ENCOUNTER — Encounter: Payer: Self-pay | Admitting: Skilled Nursing Facility1

## 2016-10-05 ENCOUNTER — Encounter: Payer: BLUE CROSS/BLUE SHIELD | Attending: General Surgery | Admitting: Skilled Nursing Facility1

## 2016-10-05 DIAGNOSIS — Z713 Dietary counseling and surveillance: Secondary | ICD-10-CM | POA: Diagnosis not present

## 2016-10-05 DIAGNOSIS — E1169 Type 2 diabetes mellitus with other specified complication: Secondary | ICD-10-CM | POA: Diagnosis not present

## 2016-10-05 DIAGNOSIS — E119 Type 2 diabetes mellitus without complications: Secondary | ICD-10-CM

## 2016-10-05 NOTE — Progress Notes (Signed)
  Follow-up visit:  8 Weeks Post-Operative Sleeve gastrectomy Surgery  Medical Nutrition Therapy:  Appt start time: 330 end time:  415  Primary concerns today: Post-operative Bariatric Surgery Nutrition Management.  Pt staets her energy could be better but it is not terrible. Pt states she is now on protonix for GERD. Pt states she has only needed to take insulin 2 times. Pt states her highest was 326 pounds and her goal was to lose 75 pounds and currently she is at a loss of 74.4 pounds. Pt states she Does not want to be under 200 pounds, whants to get to swimming. Pt states she had her Period after surgery while still in the hospital after not having her period for a year, her appointment with her GYN April 3rd and states she may need to be rushed to surgery.   Surgery date: 04/10/2016 Surgery type: sleeve gastrectomy Start weight at [facility]: 320.5 lbs on 10/13/2015 (highest weight 326 lbs) Weight today: 253.6 lbs Weight change: 26 lbs  TANITA  BODY COMP RESULTS  01/31/16 05/02/16 06/13/16 10/05/2016   BMI (kg/m^2) N/A 46.2 43.8 39.7   Fat Mass (lbs)  163.4 147 131.2   Fat Free Mass (lbs)  131.6 132.6 122.4   Total Body Water (lbs)  98.0 98 89.8    Preferred Learning Style:   No preference indicated   Learning Readiness:   Ready  24-hr recall: B (9-10 AM): oatmeal and 2 bacon (8g) Snk (AM):   L (1 PM): half frozen meal (14 g) Snk (1:30-5 PM): Atkins bar and Atkins shake (35g)  D (PM): green beans,cabbage and baked pork chop (21g) Snk (PM): shake and crackers (15-20g)  Goes to bed around 1-4 am due to strokes: leads to snacking: protein shakes, fruit cup, protein bar, peanuts  Fluid intake: 34-50.7 oz water, 22 oz shakes, sugar free popsicle, 2 diet sun drops without issue: 64+ ounces  Estimated total protein intake: 82-95 g/day  Medications: see list Supplementation: taking calcium and multi and b12  CBG monitoring: 1x in the morning Average CBG per patient: 130  mg/dL Last patient reported A1c: unknown  Using straws: no Drinking while eating: occasionally: if she feels like it needs to get down Hair loss: no N/V/D/CGAS: no, no, yes: taking colace, no Dumping syndrome: no Having you been chewing well: yes Chewing/swallowing difficulties: no Changes in vision:  no Changes to mood/headaches: no Changes to skin/Changes to nails: no Any difficulty focusing or concentrating:  no Sweating: no Dizziness/Lightheaded: Occasionally with quick movements  Palpitations: no Abdominal Pain: no  Recent physical activity:  Limited due to orthostatic hypotension: biking 3 days a week for 20 minutes   Progress Towards Goal(s):  In progress.  Handouts given during visit include:  Phase 3B lean protein + non starchy vegetables   Nutritional Diagnosis:  [place]-3.3 Overweight/obesity related to past poor dietary habits and physical inactivity as evidenced by patient w/ recent sleeve gastrectomy surgery following dietary guidelines for continued weight loss.     Intervention:  Nutrition counseling provided. Goals: -Try alkaline Water: pH of 8.8 -Try Kefir in the yogurt aisle  Teaching Method Utilized:  Visual Auditory Hands on  Barriers to learning/adherence to lifestyle change: none  Demonstrated degree of understanding via:  Teach Back   Monitoring/Evaluation:  Dietary intake, exercise, and body weight. Follow up in 3 months for 5 month post-op visit.

## 2016-10-05 NOTE — Patient Instructions (Addendum)
-  Try alkaline Water: pH of 8.8  -Try Kefir in the yogurt aisle

## 2016-10-24 DIAGNOSIS — N950 Postmenopausal bleeding: Secondary | ICD-10-CM | POA: Diagnosis not present

## 2016-10-24 DIAGNOSIS — D250 Submucous leiomyoma of uterus: Secondary | ICD-10-CM | POA: Diagnosis not present

## 2017-01-11 ENCOUNTER — Ambulatory Visit: Payer: Self-pay | Admitting: Skilled Nursing Facility1

## 2017-01-15 ENCOUNTER — Encounter: Payer: BLUE CROSS/BLUE SHIELD | Attending: General Surgery | Admitting: Skilled Nursing Facility1

## 2017-01-15 ENCOUNTER — Encounter: Payer: Self-pay | Admitting: Skilled Nursing Facility1

## 2017-01-15 DIAGNOSIS — Z6837 Body mass index (BMI) 37.0-37.9, adult: Secondary | ICD-10-CM | POA: Diagnosis not present

## 2017-01-15 DIAGNOSIS — E669 Obesity, unspecified: Secondary | ICD-10-CM | POA: Diagnosis not present

## 2017-01-15 DIAGNOSIS — Z713 Dietary counseling and surveillance: Secondary | ICD-10-CM | POA: Diagnosis not present

## 2017-01-15 DIAGNOSIS — E1169 Type 2 diabetes mellitus with other specified complication: Secondary | ICD-10-CM
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
## 2017-01-15 NOTE — Patient Instructions (Signed)
Try Kefir in the yogurt aisle

## 2017-01-29 ENCOUNTER — Telehealth: Payer: Self-pay | Admitting: Skilled Nursing Facility1

## 2017-01-29 NOTE — Telephone Encounter (Signed)
Pt called stating her Cocix bone feels like a lot of pressure on it.  Dietitian advised she call her doctor and notify him/her of the pain.

## 2017-02-14 ENCOUNTER — Other Ambulatory Visit (HOSPITAL_COMMUNITY): Payer: Self-pay | Admitting: Interventional Radiology

## 2017-02-14 DIAGNOSIS — I771 Stricture of artery: Secondary | ICD-10-CM

## 2017-02-14 DIAGNOSIS — I639 Cerebral infarction, unspecified: Secondary | ICD-10-CM

## 2017-02-22 ENCOUNTER — Ambulatory Visit (HOSPITAL_COMMUNITY)
Admission: RE | Admit: 2017-02-22 | Discharge: 2017-02-22 | Disposition: A | Discharge status: Home / Self Care | Payer: BLUE CROSS/BLUE SHIELD | Source: Ambulatory Visit | Attending: Interventional Radiology | Admitting: Interventional Radiology

## 2017-02-22 ENCOUNTER — Encounter (HOSPITAL_COMMUNITY): Payer: Self-pay

## 2017-02-22 DIAGNOSIS — I6523 Occlusion and stenosis of bilateral carotid arteries: Secondary | ICD-10-CM | POA: Diagnosis not present

## 2017-02-22 DIAGNOSIS — I639 Cerebral infarction, unspecified: Secondary | ICD-10-CM | POA: Diagnosis not present

## 2017-02-22 DIAGNOSIS — I771 Stricture of artery: Secondary | ICD-10-CM | POA: Diagnosis not present

## 2017-02-22 DIAGNOSIS — I82891 Chronic embolism and thrombosis of other specified veins: Secondary | ICD-10-CM | POA: Diagnosis not present

## 2017-02-22 DIAGNOSIS — G9389 Other specified disorders of brain: Secondary | ICD-10-CM | POA: Diagnosis not present

## 2017-02-22 LAB — POCT I-STAT CREATININE: Creatinine, Ser: 0.6 mg/dL (ref 0.44–1.00)

## 2017-02-22 MED ORDER — IOPAMIDOL (ISOVUE-370) INJECTION 76%
75.0000 mL | Freq: Once | INTRAVENOUS | Status: AC | PRN
  Administered 2017-02-22: 150 mL via INTRAVENOUS

## 2017-02-27 ENCOUNTER — Telehealth (HOSPITAL_COMMUNITY): Payer: Self-pay

## 2017-02-27 NOTE — Telephone Encounter (Signed)
Pt agreed to have angio in 3 months. AW

## 2017-03-14 ENCOUNTER — Other Ambulatory Visit: Payer: Self-pay | Admitting: Obstetrics and Gynecology

## 2017-03-29 NOTE — Patient Instructions (Addendum)
Your procedure is scheduled on:  Friday, Sept. 14, 2018  Enter through the [employer] [facility] at:  9:00 AM  Pick up the phone at the desk and dial [phone].  Call this number if you have problems the morning of surgery: [phone].  Remember: Do NOT eat food or drink after:  Midnight Thursday  Take these medicines the morning of surgery with a SIP OF WATER:  Felodipine, Gabapentin, Lamotrigine, Keprra, Pantoprazole, Rosuvastatin, Lorazapem if needed  Stop taking Aspirin and Plavix per physicians directions  Stop ALL herbal medications at this time  Bring CPAP machine day of surgery  Do NOT smoke the day of surgery.  Do NOT wear jewelry (body piercing), metal hair clips/bobby pins, make-up, artifical eyelashes or nail polish. Do NOT wear lotions, powders, or perfumes.  You may wear deodorant. Do NOT shave for 48 hours prior to surgery. Do NOT bring valuables to the hospital. Contacts, dentures, or bridgework may not be worn into surgery.  Have a responsible adult drive you home and stay with you for 24 hours after your procedure  Bring a copy of your healthcare power of attorney and living will documents.

## 2017-03-30 ENCOUNTER — Encounter (HOSPITAL_COMMUNITY): Payer: Self-pay

## 2017-03-30 ENCOUNTER — Encounter (HOSPITAL_COMMUNITY)
Admission: RE | Admit: 2017-03-30 | Discharge: 2017-03-30 | Disposition: A | Discharge status: Home / Self Care | Payer: BLUE CROSS/BLUE SHIELD | Source: Ambulatory Visit | Attending: Obstetrics and Gynecology | Admitting: Obstetrics and Gynecology

## 2017-03-30 DIAGNOSIS — N920 Excessive and frequent menstruation with regular cycle: Secondary | ICD-10-CM | POA: Insufficient documentation

## 2017-03-30 HISTORY — DX: Other specified postprocedural states: Z98.890

## 2017-03-30 HISTORY — DX: Other specified postprocedural states: R11.2

## 2017-03-30 LAB — CBC
HCT: 45.5 % (ref 36.0–46.0)
HEMOGLOBIN: 16.4 g/dL — AB (ref 12.0–15.0)
MCH: 32.2 pg (ref 26.0–34.0)
MCHC: 36 g/dL (ref 30.0–36.0)
MCV: 89.2 fL (ref 78.0–100.0)
Platelets: 193 10*3/uL (ref 150–400)
RBC: 5.1 MIL/uL (ref 3.87–5.11)
RDW: 12 % (ref 11.5–15.5)
WBC: 8.5 10*3/uL (ref 4.0–10.5)

## 2017-03-30 LAB — BASIC METABOLIC PANEL
Anion gap: 6 (ref 5–15)
BUN: 14 mg/dL (ref 6–20)
CHLORIDE: 101 mmol/L (ref 101–111)
CO2: 29 mmol/L (ref 22–32)
Calcium: 10.4 mg/dL — ABNORMAL HIGH (ref 8.9–10.3)
Creatinine, Ser: 0.66 mg/dL (ref 0.44–1.00)
GFR calc Af Amer: >60 mL/min (ref >60)
GFR calc non Af Amer: >60 mL/min (ref >60)
Glucose, Bld: 365 mg/dL — ABNORMAL HIGH (ref 65–99)
POTASSIUM: 4.2 mmol/L (ref 3.5–5.1)
SODIUM: 136 mmol/L (ref 135–145)

## 2017-03-30 LAB — PROTIME-INR
INR: 0.91
PROTHROMBIN TIME: 12.2 s (ref 11.4–15.2)

## 2017-03-30 LAB — APTT: aPTT: 30 seconds (ref 24–36)

## 2017-03-30 NOTE — Pre-Procedure Instructions (Signed)
Spoke with Dr. Marcie Bal about elevated glucose.  [name] is aware if elevated prior to surgery she may get cancelled.  [name] was also concerned about her NPO status causing her blood glucose to drop as it relates to her gastric sleeve surgery and diabetes. Dr. Meredith Mody stated she she may due clear liquids ex. Sprite, apple juice or hard peppermint candy only up until 2 hours prior to surgery.  [name] verbalized understanding of all instructions.
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
## 2017-04-06 ENCOUNTER — Ambulatory Visit (HOSPITAL_COMMUNITY)
Admission: RE | Admit: 2017-04-06 | Payer: BLUE CROSS/BLUE SHIELD | Source: Ambulatory Visit | Admitting: Obstetrics and Gynecology

## 2017-04-06 ENCOUNTER — Encounter (HOSPITAL_COMMUNITY): Admission: RE | Payer: Self-pay | Source: Ambulatory Visit

## 2017-04-06 SURGERY — DILATATION & CURETTAGE/HYSTEROSCOPY WITH MYOSURE
Anesthesia: Choice

## 2017-05-10 ENCOUNTER — Encounter (HOSPITAL_COMMUNITY)
Admission: RE | Disposition: A | Discharge status: Home / Self Care | Payer: Self-pay | Source: Ambulatory Visit | Attending: Obstetrics and Gynecology

## 2017-05-10 ENCOUNTER — Ambulatory Visit (HOSPITAL_COMMUNITY)
Admission: RE | Admit: 2017-05-10 | Discharge: 2017-05-10 | Disposition: A | Discharge status: Home / Self Care | Payer: BLUE CROSS/BLUE SHIELD | Source: Ambulatory Visit | Attending: Obstetrics and Gynecology | Admitting: Obstetrics and Gynecology

## 2017-05-10 ENCOUNTER — Ambulatory Visit (HOSPITAL_COMMUNITY): Payer: BLUE CROSS/BLUE SHIELD | Admitting: Certified Registered Nurse Anesthetist

## 2017-05-10 ENCOUNTER — Encounter (HOSPITAL_COMMUNITY): Payer: Self-pay | Admitting: Emergency Medicine

## 2017-05-10 DIAGNOSIS — Z7902 Long term (current) use of antithrombotics/antiplatelets: Secondary | ICD-10-CM | POA: Insufficient documentation

## 2017-05-10 DIAGNOSIS — Z79899 Other long term (current) drug therapy: Secondary | ICD-10-CM | POA: Insufficient documentation

## 2017-05-10 DIAGNOSIS — I129 Hypertensive chronic kidney disease with stage 1 through stage 4 chronic kidney disease, or unspecified chronic kidney disease: Secondary | ICD-10-CM | POA: Insufficient documentation

## 2017-05-10 DIAGNOSIS — Z86718 Personal history of other venous thrombosis and embolism: Secondary | ICD-10-CM | POA: Insufficient documentation

## 2017-05-10 DIAGNOSIS — Z7982 Long term (current) use of aspirin: Secondary | ICD-10-CM | POA: Diagnosis not present

## 2017-05-10 DIAGNOSIS — Z888 Allergy status to other drugs, medicaments and biological substances status: Secondary | ICD-10-CM | POA: Insufficient documentation

## 2017-05-10 DIAGNOSIS — F419 Anxiety disorder, unspecified: Secondary | ICD-10-CM | POA: Diagnosis not present

## 2017-05-10 DIAGNOSIS — N950 Postmenopausal bleeding: Secondary | ICD-10-CM | POA: Diagnosis not present

## 2017-05-10 DIAGNOSIS — F17210 Nicotine dependence, cigarettes, uncomplicated: Secondary | ICD-10-CM | POA: Diagnosis not present

## 2017-05-10 DIAGNOSIS — E7800 Pure hypercholesterolemia, unspecified: Secondary | ICD-10-CM | POA: Insufficient documentation

## 2017-05-10 DIAGNOSIS — N189 Chronic kidney disease, unspecified: Secondary | ICD-10-CM | POA: Insufficient documentation

## 2017-05-10 DIAGNOSIS — K219 Gastro-esophageal reflux disease without esophagitis: Secondary | ICD-10-CM | POA: Insufficient documentation

## 2017-05-10 DIAGNOSIS — Z791 Long term (current) use of non-steroidal anti-inflammatories (NSAID): Secondary | ICD-10-CM | POA: Diagnosis not present

## 2017-05-10 DIAGNOSIS — E1122 Type 2 diabetes mellitus with diabetic chronic kidney disease: Secondary | ICD-10-CM | POA: Diagnosis not present

## 2017-05-10 DIAGNOSIS — N840 Polyp of corpus uteri: Secondary | ICD-10-CM | POA: Diagnosis not present

## 2017-05-10 DIAGNOSIS — Z794 Long term (current) use of insulin: Secondary | ICD-10-CM | POA: Insufficient documentation

## 2017-05-10 DIAGNOSIS — G4730 Sleep apnea, unspecified: Secondary | ICD-10-CM | POA: Diagnosis not present

## 2017-05-10 HISTORY — PX: DILATATION & CURETTAGE/HYSTEROSCOPY WITH MYOSURE: SHX6511

## 2017-05-10 LAB — BASIC METABOLIC PANEL
ANION GAP: 8 (ref 5–15)
BUN: 13 mg/dL (ref 6–20)
CALCIUM: 9.5 mg/dL (ref 8.9–10.3)
CO2: 25 mmol/L (ref 22–32)
Chloride: 102 mmol/L (ref 101–111)
Creatinine, Ser: 0.5 mg/dL (ref 0.44–1.00)
GFR calc Af Amer: >60 mL/min (ref >60)
GFR calc non Af Amer: >60 mL/min (ref >60)
GLUCOSE: 197 mg/dL — AB (ref 65–99)
POTASSIUM: 3.7 mmol/L (ref 3.5–5.1)
Sodium: 135 mmol/L (ref 135–145)

## 2017-05-10 LAB — CBC
HEMATOCRIT: 43.6 % (ref 36.0–46.0)
Hemoglobin: 15.2 g/dL — ABNORMAL HIGH (ref 12.0–15.0)
MCH: 32 pg (ref 26.0–34.0)
MCHC: 34.9 g/dL (ref 30.0–36.0)
MCV: 91.8 fL (ref 78.0–100.0)
Platelets: 199 10*3/uL (ref 150–400)
RBC: 4.75 MIL/uL (ref 3.87–5.11)
RDW: 12.2 % (ref 11.5–15.5)
WBC: 8.7 10*3/uL (ref 4.0–10.5)

## 2017-05-10 LAB — GLUCOSE, CAPILLARY: Glucose-Capillary: 153 mg/dL — ABNORMAL HIGH (ref 65–99)

## 2017-05-10 SURGERY — DILATATION & CURETTAGE/HYSTEROSCOPY WITH MYOSURE
Anesthesia: General | Site: Vagina

## 2017-05-10 MED ORDER — ONDANSETRON HCL 4 MG/2ML IJ SOLN
INTRAMUSCULAR | Status: AC
  Filled 2017-05-10: qty 2

## 2017-05-10 MED ORDER — EPHEDRINE 5 MG/ML INJ
INTRAVENOUS | Status: AC
  Filled 2017-05-10: qty 10

## 2017-05-10 MED ORDER — LACTATED RINGERS IV SOLN
INTRAVENOUS | Status: DC
  Administered 2017-05-10 (×2): via INTRAVENOUS

## 2017-05-10 MED ORDER — OXYCODONE HCL 5 MG PO TABS: 5.0000 mg | ORAL_TABLET | Freq: Once | ORAL | Status: DC | PRN

## 2017-05-10 MED ORDER — SCOPOLAMINE 1 MG/3DAYS TD PT72
MEDICATED_PATCH | TRANSDERMAL | Status: AC
  Administered 2017-05-10: 1.5 mg via TRANSDERMAL
  Filled 2017-05-10: qty 1

## 2017-05-10 MED ORDER — EPHEDRINE SULFATE 50 MG/ML IJ SOLN
INTRAMUSCULAR | Status: DC | PRN
  Administered 2017-05-10: 10 mg via INTRAVENOUS

## 2017-05-10 MED ORDER — FENTANYL CITRATE (PF) 100 MCG/2ML IJ SOLN
INTRAMUSCULAR | Status: AC
  Filled 2017-05-10: qty 2

## 2017-05-10 MED ORDER — DEXAMETHASONE SODIUM PHOSPHATE 4 MG/ML IJ SOLN
INTRAMUSCULAR | Status: AC
  Filled 2017-05-10: qty 1

## 2017-05-10 MED ORDER — SCOPOLAMINE 1 MG/3DAYS TD PT72
1.0000 | MEDICATED_PATCH | Freq: Once | TRANSDERMAL | Status: DC
  Administered 2017-05-10: 1.5 mg via TRANSDERMAL

## 2017-05-10 MED ORDER — ONDANSETRON HCL 4 MG/2ML IJ SOLN: 4.0000 mg | Freq: Four times a day (QID) | INTRAMUSCULAR | Status: DC | PRN

## 2017-05-10 MED ORDER — ONDANSETRON HCL 4 MG/2ML IJ SOLN
INTRAMUSCULAR | Status: DC | PRN
  Administered 2017-05-10: 4 mg via INTRAVENOUS

## 2017-05-10 MED ORDER — LIDOCAINE HCL (CARDIAC) 20 MG/ML IV SOLN
INTRAVENOUS | Status: DC | PRN
  Administered 2017-05-10: 100 mg via INTRAVENOUS

## 2017-05-10 MED ORDER — FENTANYL CITRATE (PF) 100 MCG/2ML IJ SOLN: 25.0000 ug | INTRAMUSCULAR | Status: DC | PRN

## 2017-05-10 MED ORDER — KETOROLAC TROMETHAMINE 30 MG/ML IJ SOLN
INTRAMUSCULAR | Status: DC | PRN
  Administered 2017-05-10: 30 mg via INTRAVENOUS

## 2017-05-10 MED ORDER — LIDOCAINE HCL (CARDIAC) 20 MG/ML IV SOLN
INTRAVENOUS | Status: AC
  Filled 2017-05-10: qty 5

## 2017-05-10 MED ORDER — IBUPROFEN 800 MG PO TABS: 800.0000 mg | ORAL_TABLET | Freq: Three times a day (TID) | ORAL | 0 refills | Status: DC | PRN

## 2017-05-10 MED ORDER — MIDAZOLAM HCL 2 MG/2ML IJ SOLN
INTRAMUSCULAR | Status: AC
  Filled 2017-05-10: qty 2

## 2017-05-10 MED ORDER — MIDAZOLAM HCL 2 MG/2ML IJ SOLN
INTRAMUSCULAR | Status: DC | PRN
  Administered 2017-05-10: 2 mg via INTRAVENOUS

## 2017-05-10 MED ORDER — SODIUM CHLORIDE 0.9 % IR SOLN
Status: DC | PRN
  Administered 2017-05-10: 3000 mL

## 2017-05-10 MED ORDER — OXYCODONE HCL 5 MG/5ML PO SOLN: 5.0000 mg | Freq: Once | ORAL | Status: DC | PRN

## 2017-05-10 MED ORDER — PROPOFOL 10 MG/ML IV BOLUS
INTRAVENOUS | Status: AC
  Filled 2017-05-10: qty 20

## 2017-05-10 MED ORDER — PROPOFOL 10 MG/ML IV BOLUS
INTRAVENOUS | Status: DC | PRN
  Administered 2017-05-10: 200 mg via INTRAVENOUS

## 2017-05-10 MED ORDER — FENTANYL CITRATE (PF) 100 MCG/2ML IJ SOLN
INTRAMUSCULAR | Status: DC | PRN
  Administered 2017-05-10 (×2): 50 ug via INTRAVENOUS

## 2017-05-10 SURGICAL SUPPLY — 16 items
CANISTER SUCT 3000ML PPV (MISCELLANEOUS) ×3 IMPLANT
CATH ROBINSON RED A/P 16FR (CATHETERS) ×3 IMPLANT
CONTAINER PREFILL 10% NBF 60ML (FORM) ×6 IMPLANT
DEVICE MYOSURE LITE (MISCELLANEOUS) ×3 IMPLANT
DEVICE MYOSURE REACH (MISCELLANEOUS) IMPLANT
FILTER ARTHROSCOPY CONVERTOR (FILTER) ×3 IMPLANT
GLOVE BIOGEL PI IND STRL 7.0 (GLOVE) ×2 IMPLANT
GLOVE BIOGEL PI INDICATOR 7.0 (GLOVE) ×4
GLOVE ECLIPSE 6.5 STRL STRAW (GLOVE) ×3 IMPLANT
GOWN STRL REUS W/TWL LRG LVL3 (GOWN DISPOSABLE) ×6 IMPLANT
PACK VAGINAL MINOR WOMEN LF (CUSTOM PROCEDURE TRAY) ×3 IMPLANT
PAD OB MATERNITY 4.3X12.25 (PERSONAL CARE ITEMS) ×3 IMPLANT
SEAL ROD LENS SCOPE MYOSURE (ABLATOR) ×3 IMPLANT
TOWEL OR 17X24 6PK STRL BLUE (TOWEL DISPOSABLE) ×6 IMPLANT
TUBING AQUILEX INFLOW (TUBING) ×3 IMPLANT
TUBING AQUILEX OUTFLOW (TUBING) ×3 IMPLANT

## 2017-05-10 NOTE — H&P (Signed)
Jill [name] is an 51 y.o. female. MBF PMP here for surgical evaluation of PMB with finding of possible endometrial polyp on sonohysterogram.   Pertinent Gynecological History: Menses: post-menopausal Bleeding: post menopausal bleeding Contraception: none DES exposure: denies Blood transfusions: none Sexually transmitted diseases: no past history Previous GYN Procedures: hx endom ablation, C/S, TL, [place]  Last mammogram: normal Date: 2016 Last pap: normal Date: 05/2016 OB History: G2P2   Menstrual History: Menarche age: n/a Patient's last menstrual period was 12/10/2014.    Past Medical History:  Diagnosis Date  . Anxiety   . Back pain    complains when laying on hard flat surface  . Chronic kidney disease    appt. /w urology 11/10/2014- for a cyst seen on [place]. evaluated was told it was nothing.  . Diabetes mellitus    Diabetes II, oral and insulin.  Marland Kitchen DVT (deep venous thrombosis) (HCC)    right leg '09 or '10  . Fibroids   . GERD (gastroesophageal reflux disease)   . Heart murmur   . History of stress test    referred for cardiac cath- done here at [facility]- 09/2014  . Hypercholesterolemia   . Hypertension   . PONV (postoperative nausea and vomiting)   . Seizures ([place])    04/2010 x1- Dr. Colan Neptune x2 yearly.  . Sleep apnea    CPAP q night , last study > 3 yrs. ago  . 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 APPENDECTOMY    . CARDIAC CATHETERIZATION    . [place]   x2  . CHOLECYSTECTOMY     open"gallstones"  . INTERVENTIONAL RADIOLOGY PROCEDURE     CT angiography /neck -Dr. Estanislado Pandy.  . IR GENERIC HISTORICAL  08/03/2016   IR RADIOLOGIST EVAL & MGMT 08/03/2016 [facility]-INTERV RAD  . LAPAROSCOPIC GASTRIC  SLEEVE RESECTION N/A 04/10/2016   Procedure: LAPAROSCOPIC GASTRIC SLEEVE RESECTION WITH UPPER ENDO;  Surgeon: Greer Pickerel, MD;  Location: [facility] ORS;  Service: General;  Laterality: N/A;  . LEFT HEART CATHETERIZATION WITH CORONARY ANGIOGRAM N/A 09/28/2014   Procedure: LEFT HEART CATHETERIZATION WITH CORONARY ANGIOGRAM;  Surgeon: Jettie Booze, MD;  Location: [facility] CATH LAB;  Service: Cardiovascular;  Laterality: N/A;  . RADIOLOGY WITH ANESTHESIA N/A 10/14/2014   Procedure: ANGIOPLASTY;  Surgeon: Luanne Bras, MD;  Location: [place];  Service: Radiology;  Laterality: N/A;  . TUBAL LIGATION      Family History  Problem Relation Age of Onset  . Heart failure Father   . Heart disease Father   . Heart attack Father        46's  . Kidney disease Mother        dialysis for 26 years  . Heart attack Mother        64's  . Stroke Paternal Grandmother     Social History:  reports that she has been smoking Cigarettes.  She started smoking about 41 years ago. She has a 20.50 pack-year smoking history. She has never used smokeless tobacco. She reports that she drinks alcohol. She reports that she does not use drugs.  Allergies:  Allergies  Allergen Reactions  . Dilaudid [Hydromorphone Hcl] Nausea And Vomiting  . Crab [Shellfish Allergy] Swelling  .  Other Other (See Comments)    "pt is a difficult IV stick, prefers IV team paged for IV starts"    Prescriptions Prior to Admission  Medication Sig Dispense Refill Last Dose  . aspirin EC 81 MG tablet Take 81 mg by mouth every morning.   Past Week at Unknown time  . calcium citrate-vitamin D 500-400 MG-UNIT chewable tablet Chew 2 tablets by mouth 2 (two) times daily.   05/09/2017 at Unknown time  . clopidogrel (PLAVIX) 75 MG tablet Take 75 mg by mouth every morning.   Past Week at Unknown time  . Cyanocobalamin (VITAMIN B-12) 5000 MCG SUBL Place 5,000 mcg under the tongue every Sunday.   Past Week at Unknown time  . docusate sodium (COLACE) 100 MG  capsule Take 100 mg by mouth daily.   05/09/2017 at Unknown time  . felodipine (PLENDIL) 5 MG 24 hr tablet Take 5 mg by mouth daily.    05/10/2017 at Unknown time  . gabapentin (NEURONTIN) 300 MG capsule Take 300 mg by mouth at bedtime.   0 05/10/2017 at Unknown time  . insulin aspart (NOVOLOG) 100 UNIT/ML injection Inject 6-16 Units into the skin 3 (three) times daily with meals.    05/09/2017 at Unknown time  . lamoTRIgine (LAMICTAL) 100 MG tablet Take 100 mg by mouth every morning.    05/10/2017 at Unknown time  . levETIRAcetam (KEPPRA) 500 MG tablet Take 250 mg by mouth every 12 (twelve) hours.   05/10/2017 at Unknown time  . LORazepam (ATIVAN) 1 MG tablet Take 1 mg by mouth daily as needed for anxiety.   05/10/2017 at Unknown time  . Multiple Vitamin (MULITIVITAMIN WITH MINERALS) TABS Take 1 tablet by mouth daily at 12 noon.    05/09/2017 at Unknown time  . olmesartan (BENICAR) 40 MG tablet Take 40 mg by mouth every morning.    05/10/2017 at Unknown time  . pantoprazole (PROTONIX) 40 MG tablet Take 40 mg by mouth daily.    05/10/2017 at Unknown time  . pioglitazone (ACTOS) 15 MG tablet Take 15 mg by mouth every morning.    05/09/2017 at Unknown time  . rosuvastatin (CRESTOR) 20 MG tablet Take 20 mg by mouth at bedtime.    05/10/2017 at Unknown time  . acetaminophen (TYLENOL) 500 MG tablet Take 500-1,000 mg by mouth every 8 (eight) hours as needed for moderate pain. For pain    Unknown at Unknown time    Review of Systems  All other systems reviewed and are negative.   Blood pressure (!) 158/94, pulse 83, temperature 98.5 F (36.9 C), temperature source Oral, resp. rate 16, height 5\' 6"  (1.676 m), weight 108.9 kg (240 lb), last menstrual period 12/10/2014, SpO2 (!) 16 %. Physical Exam  Constitutional: She is oriented to person, place, and time. She appears well-developed and well-nourished.  Eyes: EOM are normal.  Neck: Neck supple.  Cardiovascular: Regular rhythm.   Genitourinary:  Vagina normal and uterus normal.  Musculoskeletal: Normal range of motion.  Neurological: She is alert and oriented to person, place, and time.  Skin: Skin is warm and dry.  Psychiatric: She has a normal mood and affect.  cervix nl Adnexal nl  Results for orders placed or performed during the hospital encounter of 05/10/17 (from the past 24 hour(s))  CBC     Status: Abnormal   Collection Time: 05/10/17 11:48 AM  Result Value Ref Range   WBC 8.7 4.0 - 10.5 K/uL   RBC 4.75 3.87 - 5.11 MIL/uL  Hemoglobin 15.2 (H) 12.0 - 15.0 g/dL   HCT 43.6 36.0 - 46.0 %   MCV 91.8 78.0 - 100.0 fL   MCH 32.0 26.0 - 34.0 pg   MCHC 34.9 30.0 - 36.0 g/dL   RDW 12.2 11.5 - 15.5 %   Platelets 199 150 - 400 K/uL  Basic metabolic panel     Status: Abnormal   Collection Time: 05/10/17 11:48 AM  Result Value Ref Range   Sodium 135 135 - 145 mmol/L   Potassium 3.7 3.5 - 5.1 mmol/L   Chloride 102 101 - 111 mmol/L   CO2 25 22 - 32 mmol/L   Glucose, Bld 197 (H) 65 - 99 mg/dL   BUN 13 6 - 20 mg/dL   Creatinine, Ser 0.50 0.44 - 1.00 mg/dL   Calcium 9.5 8.9 - 10.3 mg/dL   GFR calc non Af Amer >60 >60 mL/min   GFR calc Af Amer >60 >60 mL/min   Anion gap 8 5 - 15    No results found.  Assessment/Plan:PMB Endometrial mass P) dx hysteroscopy, D&C, hysteroscopic resection of endometrial mass. Risk of surgery  Including infection, bleeding, injury to surrounding organ structures, uterine perforation and its risk, thermal injury. All ? answered  Johnney Scarlata A 05/10/2017, 12:55 PM

## 2017-05-10 NOTE — Anesthesia Preprocedure Evaluation (Signed)
Anesthesia Evaluation  Patient identified by MRN, date of birth, ID band Patient awake    Reviewed: Allergy & Precautions, H&P , NPO status , Patient's Chart, lab work & pertinent test results  History of Anesthesia Complications (+) PONV and history of anesthetic complications  Airway Mallampati: II   Neck ROM: full    Dental   Pulmonary sleep apnea , Current Smoker,    breath sounds clear to auscultation       Cardiovascular hypertension, + Peripheral Vascular Disease   Rhythm:regular Rate:Normal     Neuro/Psych Seizures -,  PSYCHIATRIC DISORDERS Anxiety CVA, Residual Symptoms    GI/Hepatic GERD  ,  Endo/Other  diabetes, Type 2  Renal/GU      Musculoskeletal   Abdominal   Peds  Hematology   Anesthesia Other Findings   Reproductive/Obstetrics Post-menopausal bleeding                             Anesthesia Physical Anesthesia Plan  ASA: III  Anesthesia Plan: General   Post-op Pain Management:    Induction: Intravenous  PONV Risk Score and Plan: 3 and Ondansetron, Dexamethasone, Midazolam and Treatment may vary due to age or medical condition  Airway Management Planned: LMA  Additional Equipment:   Intra-op Plan:   Post-operative Plan:   Informed Consent: I have reviewed the patients History and Physical, chart, labs and discussed the procedure including the risks, benefits and alternatives for the proposed anesthesia with the patient or authorized representative who has indicated his/her understanding and acceptance.     Plan Discussed with: CRNA, Anesthesiologist and Surgeon  Anesthesia Plan Comments:         Anesthesia Quick Evaluation

## 2017-05-10 NOTE — Anesthesia Postprocedure Evaluation (Signed)
Anesthesia Post Note  Patient: Jill [name]  Procedure(s) Performed: DILATATION & CURETTAGE/HYSTEROSCOPY WITH MYOSURE (N/A Vagina )     Patient location during evaluation: PACU Anesthesia Type: General Level of consciousness: awake and alert Pain management: pain level controlled Vital Signs Assessment: post-procedure vital signs reviewed and stable Respiratory status: spontaneous breathing, nonlabored ventilation, respiratory function stable and patient connected to nasal cannula oxygen Cardiovascular status: blood pressure returned to baseline and stable Postop Assessment: no apparent nausea or vomiting Anesthetic complications: no    Last Vitals:  Vitals:   05/10/17 1445 05/10/17 1515  BP: 136/65 (!) 150/86  Pulse: 70 82  Resp: (!) 21 16  Temp: 36.8 C 36.9 C  SpO2: 97% 96%    Last Pain:  Vitals:   05/10/17 1206  TempSrc: Oral   Pain Goal: Patients Stated Pain Goal: 2 (05/10/17 1343)               Enrique Manganaro S

## 2017-05-10 NOTE — Transfer of Care (Signed)
Immediate Anesthesia Transfer of Care Note  Patient: Jill [name]  Procedure(s) Performed: DILATATION & CURETTAGE/HYSTEROSCOPY WITH MYOSURE (N/A Vagina )  Patient Location: PACU  Anesthesia Type:General  Level of Consciousness: awake, alert  and oriented  Airway & Oxygen Therapy: Patient Spontanous Breathing and Patient connected to nasal cannula oxygen  Post-op Assessment: Report given to RN, Post -op Vital signs reviewed and stable and Patient moving all extremities  Post vital signs: Reviewed and stable  Last Vitals:  Vitals:   05/10/17 1206  BP: (!) 158/94  Pulse: 83  Resp: 16  Temp: 36.9 C  SpO2: (!) 16%    Last Pain:  Vitals:   05/10/17 1206  TempSrc: Oral      Patients Stated Pain Goal: 2 (00/71/21 9758)  Complications: No apparent anesthesia complications

## 2017-05-10 NOTE — Discharge Instructions (Signed)
Post Anesthesia Home Care Instructions  NO IBUPROFEN BEFORE 7:30 TONIGHT  Activity: Get plenty of rest for the remainder of the day. A responsible individual must stay with you for 24 hours following the procedure.  For the next 24 hours, DO NOT: -Drive a car -Paediatric nurse -Drink alcoholic beverages -Take any medication unless instructed by your physician -Make any legal decisions or sign important papers.  Meals: Start with liquid foods such as gelatin or soup. Progress to regular foods as tolerated. Avoid greasy, spicy, heavy foods. If nausea and/or vomiting occur, drink only clear liquids until the nausea and/or vomiting subsides. Call your physician if vomiting continues.  Special Instructions/Symptoms: Your throat may feel dry or sore from the anesthesia or the breathing tube placed in your throat during surgery. If this causes discomfort, gargle with warm salt water. The discomfort should disappear within 24 hours.  If you had a scopolamine patch placed behind your ear for the management of post- operative nausea and/or vomiting:  1. The medication in the patch is effective for 72 hours, after which it should be removed.  Wrap patch in a tissue and discard in the trash. Wash hands thoroughly with soap and water. 2. You may remove the patch earlier than 72 hours if you experience unpleasant side effects which may include dry mouth, dizziness or visual disturbances. 3. Avoid touching the patch. Wash your hands with soap and water after contact with the patch.   DISCHARGE INSTRUCTIONS: D&C / D&E The following instructions have been prepared to help you care for yourself upon your return home.   Personal hygiene:  Use sanitary pads for vaginal drainage, not tampons.  Shower the day after your procedure.  NO tub baths, pools or Jacuzzis for 2-3 weeks.  Wipe front to back after using the bathroom.  Activity and limitations:  Do NOT drive or operate any equipment for 24  hours. The effects of anesthesia are still present and drowsiness may result.  Do NOT rest in bed all day.  Walking is encouraged.  Walk up and down stairs slowly.  You may resume your normal activity in one to two days or as indicated by your physician.  Sexual activity: NO intercourse for at least 2 weeks after the procedure, or as indicated by your physician.  Diet: Eat a light meal as desired this evening. You may resume your usual diet tomorrow.  Return to work: You may resume your work activities in one to two days or as indicated by your doctor.  What to expect after your surgery: Expect to have vaginal bleeding/discharge for 2-3 days and spotting for up to 10 days. It is not unusual to have soreness for up to 1-2 weeks. You may have a slight burning sensation when you urinate for the first day. Mild cramps may continue for a couple of days. You may have a regular period in 2-6 weeks.  Call your doctor for any of the following:  Excessive vaginal bleeding, saturating and changing one pad every hour.  Inability to urinate 6 hours after discharge from hospital.  Pain not relieved by pain medication.  Fever of 100.4 F or greater.  Unusual vaginal discharge or odor.   Call for an appointment:    Patients signature: ______________________  Nurses signature ________________________  Support person's signature_______________________   DISCHARGE INSTRUCTIONS: HYSTEROSCOPY / ENDOMETRIAL ABLATION The following instructions have been prepared to help you care for yourself upon your return home.  May Remove Scop patch on or before  May take Ibuprofen after  May take stool softner while taking narcotic pain medication to prevent constipation.  Drink plenty of water.  Personal hygiene:  Use sanitary pads for vaginal drainage, not tampons.  Shower the day after your procedure.  NO tub baths, pools or Jacuzzis for 2-3 weeks.  Wipe front to back after using the  bathroom.  Activity and limitations:  Do NOT drive or operate any equipment for 24 hours. The effects of anesthesia are still present and drowsiness may result.  Do NOT rest in bed all day.  Walking is encouraged.  Walk up and down stairs slowly.  You may resume your normal activity in one to two days or as indicated by your physician. Sexual activity: NO intercourse for at least 2 weeks after the procedure, or as indicated by your Doctor.  Diet: Eat a light meal as desired this evening. You may resume your usual diet tomorrow.  Return to Work: You may resume your work activities in one to two days or as indicated by Marine scientist.  What to expect after your surgery: Expect to have vaginal bleeding/discharge for 2-3 days and spotting for up to 10 days. It is not unusual to have soreness for up to 1-2 weeks. You may have a slight burning sensation when you urinate for the first day. Mild cramps may continue for a couple of days. You may have a regular period in 2-6 weeks.  Call your doctor for any of the following:  Excessive vaginal bleeding or clotting, saturating and changing one pad every hour.  Inability to urinate 6 hours after discharge from hospital.  Pain not relieved by pain medication.  Fever of 100.4 F or greater.  Unusual vaginal discharge or odor.  Return to office _________________Call for an appointment ___________________ Patients signature: ______________________ Nurses signature ________________________  [place] [facility] [phone]

## 2017-05-10 NOTE — Anesthesia Procedure Notes (Signed)
Procedure Name: LMA Insertion Date/Time: 05/10/2017 1:07 PM Performed by: Hewitt Blade Pre-anesthesia Checklist: Patient identified, Emergency Drugs available, Suction available and Patient being monitored Patient Re-evaluated:Patient Re-evaluated prior to induction Oxygen Delivery Method: Circle system utilized Preoxygenation: Pre-oxygenation with 100% oxygen Induction Type: IV induction LMA: LMA inserted LMA Size: 4.0 Number of attempts: 1 Placement Confirmation: positive ETCO2 and breath sounds checked- equal and bilateral Tube secured with: Tape Dental Injury: Teeth and Oropharynx as per pre-operative assessment

## 2017-05-10 NOTE — Brief Op Note (Signed)
05/10/2017  1:55 PM  PATIENT:  Lucilla Edin  51 y.o. female  PRE-OPERATIVE DIAGNOSIS:  Postmenopausal Bleeding, endometrial polyp  POST-OPERATIVE DIAGNOSIS:  Postmenopausal Bleeding  PROCEDURE:  Diagnostic hysteroscopy, dilation and curettage  SURGEON:  Surgeon(s) and Role:    * Servando Salina, MD - Primary  PHYSICIAN ASSISTANT:   ASSISTANTS: none   ANESTHESIA:   general FINDINGS; nl endometrial cavity, ostia seen EBL:  2 mL   BLOOD ADMINISTERED:none  DRAINS: none   LOCAL MEDICATIONS USED:  NONE  SPECIMEN:  Source of Specimen:  emc  DISPOSITION OF SPECIMEN:  PATHOLOGY  COUNTS:  YES  TOURNIQUET:  * No tourniquets in log *  DICTATION: .Other Dictation: Dictation Number [id number]  PLAN OF CARE: Discharge to home after PACU  PATIENT DISPOSITION:  PACU - hemodynamically stable.   Delay start of Pharmacological VTE agent (>24hrs) due to surgical blood loss or risk of bleeding: no

## 2017-05-11 ENCOUNTER — Encounter (HOSPITAL_COMMUNITY): Payer: Self-pay | Admitting: Obstetrics and Gynecology

## 2017-05-11 NOTE — Op Note (Signed)
NAME:  Jill [name], Jill [name] NO.:  MEDICAL RECORD NO.:  [id number]  LOCATION:                                 FACILITY:  PHYSICIAN:  Servando Salina, M.D.DATE OF BIRTH:  [birth year]  DATE OF PROCEDURE:  05/10/2017 DATE OF DISCHARGE:                              OPERATIVE REPORT   PREOPERATIVE DIAGNOSES: 1. Postmenopausal bleeding. 2. Endometrial polyp.  PROCEDURES: Diagnostic hysteroscopy, Dilation and curettage.  POSTOPERATIVE DIAGNOSIS:  Postmenopausal bleeding.  ANESTHESIA:  General.  SURGEON:  Servando Salina, M.D.  ASSISTANT:  None.  DESCRIPTION OF PROCEDURE:  Under adequate general anesthesia, the patient was placed in the dorsal lithotomy position.  She was sterilely prepped and draped in usual fashion.  The bladder was catheterized for moderate amount of urine.  Examination under anesthesia revealed anteverted uterus.  No adnexal masses could be appreciated.  Bivalve speculum was placed in the vagina.  Single-tooth tenaculum was placed on the anterior lip of the cervix.  The cervix was then serially dilated up to #21 [facility] dilator.  Using a MyoSure hysteroscope, uterine cavity was entered.  Both tubal ostia could be seen.  Endometrial polyp was not noted.  The endocervical canal was without a lesion.  Using the Lite resectoscope, the endometrial cavity was resected and then all instruments were then removed.  SPECIMEN:  Labeled endometrial curetting was sent to Pathology.  ESTIMATED BLOOD LOSS:  Minimal.  FLUID DEFICIT:  535 mL.  Sponge and instrument counts x2 were correct.  COMPLICATION:  None.  The patient tolerated the procedure well, was transferred to recovery in stable condition.     Servando Salina, M.D.     [place]/MEDQ  D:  05/10/2017  T:  05/10/2017  Job:  [id number]

## 2017-06-18 ENCOUNTER — Ambulatory Visit: Payer: Self-pay | Admitting: Skilled Nursing Facility1

## 2017-06-19 DIAGNOSIS — G4733 Obstructive sleep apnea (adult) (pediatric): Secondary | ICD-10-CM | POA: Diagnosis not present

## 2017-06-25 DIAGNOSIS — G4733 Obstructive sleep apnea (adult) (pediatric): Secondary | ICD-10-CM | POA: Diagnosis not present

## 2017-06-27 ENCOUNTER — Encounter: Payer: BLUE CROSS/BLUE SHIELD | Attending: General Surgery | Admitting: Skilled Nursing Facility1

## 2017-06-27 ENCOUNTER — Encounter: Payer: Self-pay | Admitting: Skilled Nursing Facility1

## 2017-06-27 DIAGNOSIS — E119 Type 2 diabetes mellitus without complications: Secondary | ICD-10-CM

## 2017-06-27 DIAGNOSIS — E669 Obesity, unspecified: Secondary | ICD-10-CM | POA: Diagnosis not present

## 2017-06-27 DIAGNOSIS — Z713 Dietary counseling and surveillance: Secondary | ICD-10-CM | POA: Diagnosis not present

## 2017-06-27 NOTE — Progress Notes (Signed)
Follow-up visit:  8 Weeks Post-Operative Sleeve gastrectomy Surgery  Medical Nutrition Therapy:  Appt start time: 330 end time:  415  Primary concerns today: Post-operative Bariatric Surgery Nutrition Management.  Pt states she is back on a sliding scale novolog. Pt states she checks her blood sugars 2 times a day running 180-220.states she has not been dragging her leg and has had a lot mor energy so she is feeling really great and thinks she has lost enough weight because she feels so good. Pt also states she has been having dizzy issues with sensory issues with colors and movement and states she has an extreme Fear of low blood sugar stating she preys before she gets into the shower. Pt states she feel show she used to feels shortly after her stroke but not as bad but feels like things are progressively getting worse.  Pt states she feels she feels like that because her blood sugar is low but she has never checked her blood sugar when having those symptoms.   Surgery date: 04/10/2016 Surgery type: sleeve gastrectomy Start weight at [facility]: 320.5 lbs on 10/13/2015 (highest weight 326 lbs) Weight today: 248.2 lbs  [place]AN[place]  BODY COMP RESULTS  01/31/16 05/02/16 06/13/16 10/05/2016 01/15/2017 06/27/2017   BMI (kg/m^2) N/A 46.2 43.8 39.7 37.8 40.1   Fat Mass (lbs)  163.4 147 131.2 106.8 128.6   Fat Free Mass (lbs)  131.6 132.6 122.4 134.4 119.6   Total Body Water (lbs)  98.0 98 89.8 97.6 87.6    Preferred Learning Style:   No preference indicated   Learning Readiness:   Ready  24-hr recall: B (9-10 AM): half oatmeal packet and 2 bacon (8g) every morning for the last 9 years Snk (AM):   L (1 PM): half frozen meal with 2-3 spoons of mac n cheese (14 g) Snk (1:30-5 PM): Atkins bar and Atkins shake (35g)  D (PM): green beans,cabbage, rice aroni and baked pork chop (21g) other half of atkins frozen meal Snk (PM): shake and crackers and popcorn or peanutbutter crackers  (15-20g)  Goes to bed  around 1-4 am due to strokes: leads to snacking: protein shakes, fruit cup, protein bar, peanuts  Fluid intake: 34-50.7 oz water, 22 oz shakes, sugar free popsicle, 2 diet sun drops without issue: 64+ ounces  Estimated total protein intake: 82-95 g/day  Medications: see list Supplementation: taking calcium and multi and b12  CBG monitoring: 2x  Average CBG per patient: 180-220 mg/dL Last patient reported A1c: unknown  Using straws: no Drinking while eating: occasionally: if she feels like it needs to get down Hair loss: no N/V/D/CGAS: no, no, no, no Dumping syndrome: no Having you been chewing well: yes Chewing/swallowing difficulties: no Changes in vision:  no Changes to mood/headaches: no Changes to skin/Changes to nails: no Any difficulty focusing or concentrating:  no Sweating: no Dizziness/Lightheaded: Occasionally with quick movements  Palpitations: no Abdominal Pain: no  Recent physical activity:  Limited due to orthostatic hypotension: biking 2 days a week for 20 minutes, walking more  Progress Towards Goal(s):  In progress.  Handouts given during visit include:  Phase 3B lean protein + non starchy vegetables   Nutritional Diagnosis:  [place]-3.3 Overweight/obesity related to past poor dietary habits and physical inactivity as evidenced by patient w/ recent sleeve gastrectomy surgery following dietary guidelines for continued weight loss.     Intervention:  Nutrition counseling provided. Goals: -Look into changing your frozen meals to better options  -Log your food and log  your blood sugars  -Have a conversation with your family about your needs -Call your naurologist -Take you blood suagr monitor every wheere you go -See counselor about phobia   Teaching Method Utilized:  Visual Auditory Hands on  Barriers to learning/adherence to lifestyle change: none  Demonstrated degree of understanding via:  Teach Back   Monitoring/Evaluation:  Dietary intake,  exercise, and body weight. Follow up in 3 months for 5 month post-op visit.

## 2017-06-27 NOTE — Patient Instructions (Addendum)
-  Look into changing your frozen meals to better options   -Log your food and log your blood sugars   -Have a conversation with your family about your needs  -Call your naurologist  -Take you blood suagr monitor every wheere you go  -See counselor about phobia

## 2017-07-19 DIAGNOSIS — G4733 Obstructive sleep apnea (adult) (pediatric): Secondary | ICD-10-CM | POA: Diagnosis not present

## 2017-08-20 DIAGNOSIS — G4733 Obstructive sleep apnea (adult) (pediatric): Secondary | ICD-10-CM | POA: Diagnosis not present

## 2017-09-05 DIAGNOSIS — I10 Essential (primary) hypertension: Secondary | ICD-10-CM | POA: Diagnosis not present

## 2017-09-05 DIAGNOSIS — E118 Type 2 diabetes mellitus with unspecified complications: Secondary | ICD-10-CM | POA: Diagnosis not present

## 2017-09-05 DIAGNOSIS — E782 Mixed hyperlipidemia: Secondary | ICD-10-CM | POA: Diagnosis not present

## 2017-09-13 ENCOUNTER — Other Ambulatory Visit (HOSPITAL_COMMUNITY): Payer: Self-pay | Admitting: Family Medicine

## 2017-09-13 DIAGNOSIS — Z1231 Encounter for screening mammogram for malignant neoplasm of breast: Secondary | ICD-10-CM

## 2017-09-19 DIAGNOSIS — G4733 Obstructive sleep apnea (adult) (pediatric): Secondary | ICD-10-CM | POA: Diagnosis not present

## 2017-09-20 DIAGNOSIS — Z8673 Personal history of transient ischemic attack (TIA), and cerebral infarction without residual deficits: Secondary | ICD-10-CM | POA: Diagnosis not present

## 2017-09-20 DIAGNOSIS — G4733 Obstructive sleep apnea (adult) (pediatric): Secondary | ICD-10-CM | POA: Diagnosis not present

## 2017-09-20 DIAGNOSIS — E669 Obesity, unspecified: Secondary | ICD-10-CM | POA: Diagnosis not present

## 2017-09-20 DIAGNOSIS — I10 Essential (primary) hypertension: Secondary | ICD-10-CM | POA: Diagnosis not present

## 2017-09-20 DIAGNOSIS — Z9884 Bariatric surgery status: Secondary | ICD-10-CM | POA: Diagnosis not present

## 2017-09-20 DIAGNOSIS — Z9989 Dependence on other enabling machines and devices: Secondary | ICD-10-CM | POA: Diagnosis not present

## 2017-09-20 DIAGNOSIS — E1169 Type 2 diabetes mellitus with other specified complication: Secondary | ICD-10-CM | POA: Diagnosis not present

## 2017-09-24 ENCOUNTER — Ambulatory Visit (HOSPITAL_COMMUNITY)
Admission: RE | Admit: 2017-09-24 | Discharge: 2017-09-24 | Disposition: A | Discharge status: Home / Self Care | Payer: BLUE CROSS/BLUE SHIELD | Source: Ambulatory Visit | Attending: Family Medicine | Admitting: Family Medicine

## 2017-09-24 ENCOUNTER — Encounter (HOSPITAL_COMMUNITY): Payer: Self-pay

## 2017-09-24 DIAGNOSIS — Z1231 Encounter for screening mammogram for malignant neoplasm of breast: Secondary | ICD-10-CM | POA: Diagnosis not present

## 2017-10-13 DIAGNOSIS — E785 Hyperlipidemia, unspecified: Secondary | ICD-10-CM | POA: Diagnosis not present

## 2017-10-13 DIAGNOSIS — I10 Essential (primary) hypertension: Secondary | ICD-10-CM | POA: Diagnosis not present

## 2017-10-13 DIAGNOSIS — E118 Type 2 diabetes mellitus with unspecified complications: Secondary | ICD-10-CM | POA: Diagnosis not present

## 2017-10-20 DIAGNOSIS — G4733 Obstructive sleep apnea (adult) (pediatric): Secondary | ICD-10-CM | POA: Diagnosis not present

## 2017-10-22 DIAGNOSIS — E1165 Type 2 diabetes mellitus with hyperglycemia: Secondary | ICD-10-CM | POA: Diagnosis not present

## 2017-10-22 DIAGNOSIS — I10 Essential (primary) hypertension: Secondary | ICD-10-CM | POA: Diagnosis not present

## 2017-11-19 DIAGNOSIS — G4733 Obstructive sleep apnea (adult) (pediatric): Secondary | ICD-10-CM | POA: Diagnosis not present

## 2017-12-19 DIAGNOSIS — G4733 Obstructive sleep apnea (adult) (pediatric): Secondary | ICD-10-CM | POA: Diagnosis not present

## 2017-12-21 DIAGNOSIS — G4733 Obstructive sleep apnea (adult) (pediatric): Secondary | ICD-10-CM | POA: Diagnosis not present

## 2018-01-16 DIAGNOSIS — E1165 Type 2 diabetes mellitus with hyperglycemia: Secondary | ICD-10-CM | POA: Diagnosis not present

## 2018-01-16 DIAGNOSIS — R569 Unspecified convulsions: Secondary | ICD-10-CM | POA: Diagnosis not present

## 2018-01-16 DIAGNOSIS — I10 Essential (primary) hypertension: Secondary | ICD-10-CM | POA: Diagnosis not present

## 2018-01-16 DIAGNOSIS — E118 Type 2 diabetes mellitus with unspecified complications: Secondary | ICD-10-CM | POA: Diagnosis not present

## 2018-01-18 DIAGNOSIS — G4733 Obstructive sleep apnea (adult) (pediatric): Secondary | ICD-10-CM | POA: Diagnosis not present

## 2018-01-30 DIAGNOSIS — I10 Essential (primary) hypertension: Secondary | ICD-10-CM | POA: Diagnosis not present

## 2018-01-30 DIAGNOSIS — I70201 Unspecified atherosclerosis of native arteries of extremities, right leg: Secondary | ICD-10-CM | POA: Diagnosis not present

## 2018-01-30 DIAGNOSIS — Z794 Long term (current) use of insulin: Secondary | ICD-10-CM | POA: Diagnosis not present

## 2018-01-30 DIAGNOSIS — E1165 Type 2 diabetes mellitus with hyperglycemia: Secondary | ICD-10-CM | POA: Diagnosis not present

## 2018-02-07 ENCOUNTER — Telehealth (HOSPITAL_COMMUNITY): Payer: Self-pay

## 2018-02-07 ENCOUNTER — Other Ambulatory Visit (HOSPITAL_COMMUNITY): Payer: Self-pay | Admitting: Interventional Radiology

## 2018-02-07 DIAGNOSIS — I771 Stricture of artery: Secondary | ICD-10-CM

## 2018-02-07 NOTE — Telephone Encounter (Signed)
Called to schedule angio, no answer, left vm. AW  

## 2018-02-17 DIAGNOSIS — G4733 Obstructive sleep apnea (adult) (pediatric): Secondary | ICD-10-CM | POA: Diagnosis not present

## 2018-02-18 DIAGNOSIS — E1165 Type 2 diabetes mellitus with hyperglycemia: Secondary | ICD-10-CM | POA: Diagnosis not present

## 2018-02-21 ENCOUNTER — Other Ambulatory Visit: Payer: Self-pay | Admitting: Student

## 2018-02-22 ENCOUNTER — Other Ambulatory Visit (HOSPITAL_COMMUNITY): Payer: Self-pay | Admitting: Interventional Radiology

## 2018-02-22 ENCOUNTER — Ambulatory Visit (HOSPITAL_COMMUNITY)
Admission: RE | Admit: 2018-02-22 | Discharge: 2018-02-22 | Disposition: A | Discharge status: Home / Self Care | Payer: BLUE CROSS/BLUE SHIELD | Source: Ambulatory Visit | Attending: Interventional Radiology | Admitting: Interventional Radiology

## 2018-02-22 DIAGNOSIS — Z86718 Personal history of other venous thrombosis and embolism: Secondary | ICD-10-CM | POA: Insufficient documentation

## 2018-02-22 DIAGNOSIS — Z91013 Allergy to seafood: Secondary | ICD-10-CM | POA: Diagnosis not present

## 2018-02-22 DIAGNOSIS — I129 Hypertensive chronic kidney disease with stage 1 through stage 4 chronic kidney disease, or unspecified chronic kidney disease: Secondary | ICD-10-CM | POA: Diagnosis not present

## 2018-02-22 DIAGNOSIS — Z9884 Bariatric surgery status: Secondary | ICD-10-CM | POA: Insufficient documentation

## 2018-02-22 DIAGNOSIS — Z955 Presence of coronary angioplasty implant and graft: Secondary | ICD-10-CM | POA: Diagnosis not present

## 2018-02-22 DIAGNOSIS — E7800 Pure hypercholesterolemia, unspecified: Secondary | ICD-10-CM | POA: Diagnosis not present

## 2018-02-22 DIAGNOSIS — Z7982 Long term (current) use of aspirin: Secondary | ICD-10-CM | POA: Insufficient documentation

## 2018-02-22 DIAGNOSIS — Z885 Allergy status to narcotic agent status: Secondary | ICD-10-CM | POA: Insufficient documentation

## 2018-02-22 DIAGNOSIS — Z7902 Long term (current) use of antithrombotics/antiplatelets: Secondary | ICD-10-CM | POA: Insufficient documentation

## 2018-02-22 DIAGNOSIS — F419 Anxiety disorder, unspecified: Secondary | ICD-10-CM | POA: Insufficient documentation

## 2018-02-22 DIAGNOSIS — Z841 Family history of disorders of kidney and ureter: Secondary | ICD-10-CM | POA: Insufficient documentation

## 2018-02-22 DIAGNOSIS — Z9889 Other specified postprocedural states: Secondary | ICD-10-CM | POA: Insufficient documentation

## 2018-02-22 DIAGNOSIS — F17210 Nicotine dependence, cigarettes, uncomplicated: Secondary | ICD-10-CM | POA: Insufficient documentation

## 2018-02-22 DIAGNOSIS — R569 Unspecified convulsions: Secondary | ICD-10-CM | POA: Diagnosis not present

## 2018-02-22 DIAGNOSIS — Z9049 Acquired absence of other specified parts of digestive tract: Secondary | ICD-10-CM | POA: Insufficient documentation

## 2018-02-22 DIAGNOSIS — Z9109 Other allergy status, other than to drugs and biological substances: Secondary | ICD-10-CM | POA: Insufficient documentation

## 2018-02-22 DIAGNOSIS — N189 Chronic kidney disease, unspecified: Secondary | ICD-10-CM | POA: Diagnosis not present

## 2018-02-22 DIAGNOSIS — I771 Stricture of artery: Secondary | ICD-10-CM | POA: Diagnosis not present

## 2018-02-22 DIAGNOSIS — G4730 Sleep apnea, unspecified: Secondary | ICD-10-CM | POA: Diagnosis not present

## 2018-02-22 DIAGNOSIS — Z8673 Personal history of transient ischemic attack (TIA), and cerebral infarction without residual deficits: Secondary | ICD-10-CM | POA: Diagnosis not present

## 2018-02-22 DIAGNOSIS — I6523 Occlusion and stenosis of bilateral carotid arteries: Secondary | ICD-10-CM | POA: Insufficient documentation

## 2018-02-22 DIAGNOSIS — Z79899 Other long term (current) drug therapy: Secondary | ICD-10-CM | POA: Diagnosis not present

## 2018-02-22 DIAGNOSIS — R51 Headache: Secondary | ICD-10-CM | POA: Diagnosis not present

## 2018-02-22 DIAGNOSIS — Z823 Family history of stroke: Secondary | ICD-10-CM | POA: Insufficient documentation

## 2018-02-22 DIAGNOSIS — K219 Gastro-esophageal reflux disease without esophagitis: Secondary | ICD-10-CM | POA: Insufficient documentation

## 2018-02-22 DIAGNOSIS — E1122 Type 2 diabetes mellitus with diabetic chronic kidney disease: Secondary | ICD-10-CM | POA: Insufficient documentation

## 2018-02-22 DIAGNOSIS — Z794 Long term (current) use of insulin: Secondary | ICD-10-CM | POA: Insufficient documentation

## 2018-02-22 DIAGNOSIS — Z8249 Family history of ischemic heart disease and other diseases of the circulatory system: Secondary | ICD-10-CM | POA: Insufficient documentation

## 2018-02-22 DIAGNOSIS — Z9851 Tubal ligation status: Secondary | ICD-10-CM | POA: Diagnosis not present

## 2018-02-22 HISTORY — PX: IR ANGIO VERTEBRAL SEL VERTEBRAL BILAT MOD SED: IMG5369

## 2018-02-22 HISTORY — PX: IR ANGIO INTRA EXTRACRAN SEL COM CAROTID INNOMINATE BILAT MOD SED: IMG5360

## 2018-02-22 LAB — CBC
HEMATOCRIT: 44.2 % (ref 36.0–46.0)
Hemoglobin: 14.7 g/dL (ref 12.0–15.0)
MCH: 30.9 pg (ref 26.0–34.0)
MCHC: 33.3 g/dL (ref 30.0–36.0)
MCV: 92.9 fL (ref 78.0–100.0)
Platelets: 194 10*3/uL (ref 150–400)
RBC: 4.76 MIL/uL (ref 3.87–5.11)
RDW: 11.4 % — AB (ref 11.5–15.5)
WBC: 8.7 10*3/uL (ref 4.0–10.5)

## 2018-02-22 LAB — BASIC METABOLIC PANEL
Anion gap: 7 (ref 5–15)
BUN: 15 mg/dL (ref 6–20)
CHLORIDE: 104 mmol/L (ref 98–111)
CO2: 28 mmol/L (ref 22–32)
Calcium: 10 mg/dL (ref 8.9–10.3)
Creatinine, Ser: 0.57 mg/dL (ref 0.44–1.00)
GFR calc Af Amer: >60 mL/min (ref >60)
GFR calc non Af Amer: >60 mL/min (ref >60)
Glucose, Bld: 243 mg/dL — ABNORMAL HIGH (ref 70–99)
POTASSIUM: 3.8 mmol/L (ref 3.5–5.1)
SODIUM: 139 mmol/L (ref 135–145)

## 2018-02-22 LAB — PROTIME-INR
INR: 0.99
Prothrombin Time: 13 seconds (ref 11.4–15.2)

## 2018-02-22 LAB — GLUCOSE, CAPILLARY: Glucose-Capillary: 229 mg/dL — ABNORMAL HIGH (ref 70–99)

## 2018-02-22 LAB — APTT: aPTT: 31 seconds (ref 24–36)

## 2018-02-22 MED ORDER — HEPARIN SODIUM (PORCINE) 1000 UNIT/ML IJ SOLN
INTRAMUSCULAR | Status: AC
  Filled 2018-02-22: qty 1

## 2018-02-22 MED ORDER — IOPAMIDOL (ISOVUE-300) INJECTION 61%
INTRAVENOUS | Status: AC
  Filled 2018-02-22: qty 50

## 2018-02-22 MED ORDER — IOHEXOL 300 MG/ML  SOLN
150.0000 mL | Freq: Once | INTRAMUSCULAR | Status: AC | PRN
  Administered 2018-02-22: 95 mL via INTRA_ARTERIAL

## 2018-02-22 MED ORDER — DIPHENHYDRAMINE HCL 50 MG/ML IJ SOLN
INTRAMUSCULAR | Status: AC | PRN
  Administered 2018-02-22: 50 mg via INTRAVENOUS

## 2018-02-22 MED ORDER — HYDRALAZINE HCL 20 MG/ML IJ SOLN
INTRAMUSCULAR | Status: AC | PRN
  Administered 2018-02-22 (×3): 5 mg via INTRAVENOUS

## 2018-02-22 MED ORDER — LIDOCAINE HCL (PF) 1 % IJ SOLN
INTRAMUSCULAR | Status: AC | PRN
  Administered 2018-02-22: 10 mL

## 2018-02-22 MED ORDER — MIDAZOLAM HCL 2 MG/2ML IJ SOLN
INTRAMUSCULAR | Status: AC
  Filled 2018-02-22: qty 2

## 2018-02-22 MED ORDER — FENTANYL CITRATE (PF) 100 MCG/2ML IJ SOLN
INTRAMUSCULAR | Status: AC | PRN
  Administered 2018-02-22: 25 ug via INTRAVENOUS

## 2018-02-22 MED ORDER — FENTANYL CITRATE (PF) 100 MCG/2ML IJ SOLN
INTRAMUSCULAR | Status: AC
  Filled 2018-02-22: qty 2

## 2018-02-22 MED ORDER — DIPHENHYDRAMINE HCL 50 MG/ML IJ SOLN
INTRAMUSCULAR | Status: AC
  Filled 2018-02-22: qty 1

## 2018-02-22 MED ORDER — HEPARIN SODIUM (PORCINE) 1000 UNIT/ML IJ SOLN
INTRAMUSCULAR | Status: AC | PRN
  Administered 2018-02-22: 1000 [IU] via INTRAVENOUS

## 2018-02-22 MED ORDER — SODIUM CHLORIDE 0.9 % IV SOLN: INTRAVENOUS | Status: AC

## 2018-02-22 MED ORDER — MIDAZOLAM HCL 2 MG/2ML IJ SOLN
INTRAMUSCULAR | Status: AC | PRN
  Administered 2018-02-22: 0.5 mg via INTRAVENOUS

## 2018-02-22 MED ORDER — LIDOCAINE HCL 1 % IJ SOLN
INTRAMUSCULAR | Status: AC
  Filled 2018-02-22: qty 20

## 2018-02-22 MED ORDER — HYDRALAZINE HCL 20 MG/ML IJ SOLN
INTRAMUSCULAR | Status: AC
  Filled 2018-02-22: qty 1

## 2018-02-22 MED ORDER — SODIUM CHLORIDE 0.9 % IV SOLN: INTRAVENOUS | Status: DC

## 2018-02-22 NOTE — Sedation Documentation (Signed)
Patient is resting comfortably. 

## 2018-02-22 NOTE — Sedation Documentation (Signed)
IR tech to hold pressure R groin

## 2018-02-22 NOTE — Procedures (Signed)
S/P 4 vessel cerebral arteriogram RT CFA approach  Findings. 1.Occluded RT ICA distal to RT ophthalmic A old. 2.Approx 50 % stenosis at previously angioplastied site Lt ICA supraclinoid seg.

## 2018-02-22 NOTE — Discharge Instructions (Signed)

## 2018-02-22 NOTE — Sedation Documentation (Signed)
Continuing to hold pressure R groin

## 2018-02-22 NOTE — Sedation Documentation (Signed)
Pressure hold R  Groin complete, dsg on- bedrest 4 hrs from now

## 2018-02-22 NOTE — H&P (Signed)
Chief Complaint: Patient was seen in consultation today for left ICA supraclinoid segment stenosis and right MCA M1 segment stenosis.  Referring Physician(s): None  Supervising Physician: Luanne Bras  Patient Status: [facility] - Out-pt  History of Present Illness: Jill [name] is a 52 y.o. female with a past medical history of hypertension, hypercholesterolemia, CVA 2009, DVT 2009/2010, seizures, GERD, hepatitis, diabetes mellitus, CKD, fibroids, sleep apnea, back pain, and anxiety. She is known to [facility] and has been followed by Dr. Estanislado Pandy and Dr. Laurence Ferrari since 05/2008. She was most recently seen in clinic 08/03/2016 with Dr. Estanislado Pandy. She has undergone multiple diagnostic cerebral angiograms and imaging scans to monitor multiple areas of intracranial stenosis.  CTA head/neck 02/22/2017: 1. 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 Stable reconstitution of the right MCA and ACA, in part resembling moyamoya. 2. Cervical left carotid atherosclerosis without stenosis, but more pronounced a left ICA siphon plaque and moderate to severe chronic left supraclinoid segment stenosis. Left ICA terminus remains patent. Proximal left MCA and ACA remain normal, although there is mild to moderate irregularity of the distal left ACA and MCA branches, likely atherosclerotic. 3. Dominant right vertebral artery with moderate right V1 segment stenosis due to calcified plaque. No other significant vertebral stenosis, and otherwise negative posterior circulation. 4. Stable non contrast CT appearance of the brain with chronic anterior right MCA territory encephalomalacia.  Patient presents today for possible image-guided cerebral angiogram. Patient awake and alert laying in bed with no complaints at this time. Accompanied by husband at bedside. Denies headache, weakness, numbness/tingling, dizziness, vision changes, hearing changes, tinnitus, or speech  difficulty.  Patient is currently taking Plavix 75 mg once daily.   Past Medical History:  Diagnosis Date  . Anxiety   . Back pain    complains when laying on hard flat surface  . Chronic kidney disease    appt. /w urology 11/10/2014- for a cyst seen on [place]. evaluated was told it was nothing.  . Diabetes mellitus    Diabetes II, oral and insulin.  Marland Kitchen DVT (deep venous thrombosis) (HCC)    right leg '09 or '10  . Fibroids   . GERD (gastroesophageal reflux disease)   . Heart murmur   . History of stress test    referred for cardiac cath- done [facility] at [facility]- 09/2014  . Hypercholesterolemia   . Hypertension   . PONV (postoperative nausea and vomiting)   . Seizures ([place])    04/2010 x1- Dr. Colan Neptune x2 yearly.  . Sleep apnea    CPAP q night , last study > 3 yrs. ago  . Stroke [facility])    '09-had blockage in brain, too deep to operate"occ left lower extremity fatique, occ. 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 APPENDECTOMY    . CARDIAC CATHETERIZATION    . [place]   x2  . CHOLECYSTECTOMY     open"gallstones"  . DILATATION & CURETTAGE/HYSTEROSCOPY WITH MYOSURE N/A 05/10/2017   Procedure: DILATATION & CURETTAGE/HYSTEROSCOPY WITH MYOSURE;  Surgeon: Servando Salina, MD;  Location: [facility];  Service: Gynecology;  Laterality: N/A;  . INTERVENTIONAL RADIOLOGY PROCEDURE     CT angiography /neck -Dr. Estanislado Pandy.  . IR GENERIC HISTORICAL  08/03/2016   IR RADIOLOGIST EVAL & MGMT 08/03/2016 [facility]-INTERV RAD  . LAPAROSCOPIC  GASTRIC SLEEVE RESECTION N/A 04/10/2016   Procedure: LAPAROSCOPIC GASTRIC SLEEVE RESECTION WITH UPPER ENDO;  Surgeon: Greer Pickerel, MD;  Location: [facility];  Service: General;  Laterality: N/A;  . LEFT HEART CATHETERIZATION WITH CORONARY ANGIOGRAM N/A 09/28/2014   Procedure: LEFT HEART CATHETERIZATION WITH CORONARY  ANGIOGRAM;  Surgeon: Jettie Booze, MD;  Location: [facility] CATH LAB;  Service: Cardiovascular;  Laterality: N/A;  . RADIOLOGY WITH ANESTHESIA N/A 10/14/2014   Procedure: ANGIOPLASTY;  Surgeon: Luanne Bras, MD;  Location: [place];  Service: Radiology;  Laterality: N/A;  . TUBAL LIGATION      Allergies: Dilaudid [hydromorphone hcl]; Crab [shellfish allergy]; and Other  Medications: Prior to Admission medications   Medication Sig Start Date End Date Taking? Authorizing Provider  acetaminophen (TYLENOL) 500 MG tablet Take 500-1,000 mg by mouth every 8 (eight) hours as needed for moderate pain or headache.    Yes [provider]  aspirin EC 81 MG tablet Take 81 mg by mouth every morning.   Yes [provider]  calcium citrate-vitamin D 500-400 MG-UNIT chewable tablet Chew 2 tablets by mouth 2 (two) times daily.   Yes [provider]  clopidogrel (PLAVIX) 75 MG tablet Take 75 mg by mouth every morning.   Yes [provider]  Cyanocobalamin (VITAMIN B-12) 5000 MCG SUBL Place 5,000 mcg under the tongue 2 (two) times a week.    Yes [provider]  docusate sodium (COLACE) 100 MG capsule Take 100 mg by mouth daily.   Yes [provider]  felodipine (PLENDIL) 5 MG 24 hr tablet Take 5 mg by mouth daily.    Yes [provider]  gabapentin (NEURONTIN) 300 MG capsule Take 300 mg by mouth at bedtime.  01/07/16  Yes [provider]  ibuprofen (ADVIL,MOTRIN) 800 MG tablet Take 1 tablet (800 mg total) by mouth every 8 (eight) hours as needed. Patient taking differently: Take 800 mg by mouth every 8 (eight) hours as needed for headache or moderate pain.  05/10/17  Yes Cousins, Alanda Slim, MD  insulin aspart (NOVOLOG) 100 UNIT/ML injection Inject 5-10 Units into the skin 3 (three) times daily with meals. Per sliding scale   Yes [provider]  lamoTRIgine (LAMICTAL) 100 MG tablet Take 100 mg by mouth every morning.    Yes  [provider]  levETIRAcetam (KEPPRA) 500 MG tablet Take 250 mg by mouth every 12 (twelve) hours.   Yes [provider]  LORazepam (ATIVAN) 1 MG tablet Take 1 mg by mouth daily as needed for anxiety.   Yes [provider]  metFORMIN (GLUCOPHAGE) 500 MG tablet Take 500 mg by mouth 2 (two) times daily with a meal.   Yes [provider]  Multiple Vitamin (MULITIVITAMIN WITH MINERALS) TABS Take 1 tablet by mouth daily.    Yes [provider]  olmesartan (BENICAR) 40 MG tablet Take 40 mg by mouth every morning.    Yes [provider]  pantoprazole (PROTONIX) 40 MG tablet Take 40 mg by mouth daily.  07/26/16  Yes [provider]  pioglitazone (ACTOS) 15 MG tablet Take 15 mg by mouth every morning.    Yes [provider]  rosuvastatin (CRESTOR) 20 MG tablet Take 20 mg by mouth at bedtime.    Yes [provider]     Family History  Problem Relation Age of Onset  . Heart failure Father   . Heart disease Father   . Heart attack Father  38's  . Kidney disease Mother        dialysis for 26 years  . Heart attack Mother        74's  . Stroke Paternal Grandmother     Social History   Socioeconomic History  . Marital status: Married    Spouse name: Not on file  . Number of children: Not on file  . Years of education: Not on file  . Highest education level: Not on file  Occupational History  . Not on file  Social Needs  . Financial resource strain: Not on file  . Food insecurity:    Worry: Not on file    Inability: Not on file  . Transportation needs:    Medical: Not on file    Non-medical: Not on file  Tobacco Use  . Smoking status: Current Some Day Smoker    Packs/day: 0.50    Years: 41.00    Pack years: 20.50    Types: Cigarettes    Start date: 07/25/1975  . Smokeless tobacco: Never Used  Substance and Sexual Activity  . Alcohol use: Yes    Alcohol/week: 0.0 oz    Comment: rare use   . Drug  use: No  . Sexual activity: Yes    Birth control/protection: Other-see comments, Surgical    Comment: Tubal Ligation  Lifestyle  . Physical activity:    Days per week: Not on file    Minutes per session: Not on file  . Stress: Not on file  Relationships  . Social connections:    Talks on phone: Not on file    Gets together: Not on file    Attends religious service: Not on file    Active member of club or organization: Not on file    Attends meetings of clubs or organizations: Not on file    Relationship status: Not on file  Other Topics Concern  . Not on file  Social History Narrative  . Not on file     Review of Systems: A 12 point ROS discussed and pertinent positives are indicated in the HPI above.  All other systems are negative.  Review of Systems  Constitutional: Negative for chills and fever.  HENT: Negative for hearing loss and tinnitus.   Eyes: Negative for visual disturbance.  Respiratory: Negative for shortness of breath and wheezing.   Cardiovascular: Negative for chest pain and palpitations.  Neurological: Negative for dizziness, speech difficulty, weakness, numbness and headaches.  Psychiatric/Behavioral: Negative for behavioral problems and confusion.    Vital Signs: BP (!) 154/74   Pulse 71   Temp 98.1 F (36.7 C) (Oral)   Ht 5\' 6"  (1.676 m)   Wt 245 lb (111.1 kg)   LMP 12/10/2014 Comment: pt states that there is no chance or pregnancy  SpO2 100%   BMI 39.54 kg/m   Physical Exam  Constitutional: She is oriented to person, place, and time. She appears well-developed and well-nourished. No distress.  Cardiovascular: Normal rate, regular rhythm and normal heart sounds.  No murmur heard. Pulmonary/Chest: Effort normal and breath sounds normal. No respiratory distress. She has no wheezes.  Neurological: She is alert and oriented to person, place, and time.  Skin: Skin is warm and dry.  Psychiatric: She has a normal mood and affect. Her behavior is  normal. Judgment and thought content normal.  Nursing note and vitals reviewed.    MD Evaluation Airway: WNL Heart: WNL Abdomen: WNL Chest/ Lungs: WNL ASA  Classification: 3 Mallampati/Airway Score:  Two   Imaging: No results found.  Labs:  CBC: Recent Labs    03/30/17 1042 05/10/17 1148  WBC 8.5 8.7  HGB 16.4* 15.2*  HCT 45.5 43.6  PLT 193 199    COAGS: Recent Labs    03/30/17 1042  INR 0.91  APTT 30    BMP: Recent Labs    02/22/17 1517 03/30/17 1042 05/10/17 1148  NA  --  136 135  K  --  4.2 3.7  CL  --  101 102  CO2  --  29 25  GLUCOSE  --  365* 197*  BUN  --  14 13  CALCIUM  --  10.4* 9.5  CREATININE 0.60 0.66 0.50  GFRNONAA  --  >60 >60  GFRAA  --  >60 >60    LIVER FUNCTION TESTS: No results for input(s): BILITOT, AST, ALT, ALKPHOS, PROT, ALBUMIN in the last 8760 hours.  TUMOR MARKERS: No results for input(s): AFPTM, CEA, CA199, CHROMGRNA in the last 8760 hours.  Assessment and Plan:  Left ICA supraclinoid segment stenosis. Right MCA M1 segment stenosis. Plan for image-guided diagnostic cerebral angiogram today with Dr. Estanislado Pandy. Patient is NPO. Denies fever. She does take Plavix 75 mg once daily- ok to proceed per Dr. Estanislado Pandy. INR pending.  Risks and benefits of cerebral angiogram were discussed with the patient including, but not limited to bleeding, infection, vascular injury or contrast induced renal failure. This interventional procedure involves the use of X-rays and because of the nature of the planned procedure, it is possible that we will have prolonged use of X-ray fluoroscopy. Potential radiation risks to you include (but are not limited to) the following: - A slightly elevated risk for cancer  several years later in life. This risk is typically less than 0.5% percent. This risk is low in comparison to the normal incidence of human cancer, which is 33% for women and 50% for men according to the [place]. -  Radiation induced injury can include skin redness, resembling a rash, tissue breakdown / ulcers and hair loss (which can be temporary or permanent).  The likelihood of either of these occurring depends on the difficulty of the procedure and whether you are sensitive to radiation due to previous procedures, disease, or genetic conditions.  IF your procedure requires a prolonged use of radiation, you will be notified and given written instructions for further action.  It is your responsibility to monitor the irradiated area for the 2 weeks following the procedure and to notify your physician if you are concerned that you have suffered a radiation induced injury.   All of the patient's questions were answered, patient is agreeable to proceed. Consent signed and in chart.   Thank you for this interesting consult.  I greatly enjoyed meeting JAIMARIE RAPOZO and look forward to participating in their care.  A copy of this report was sent to the requesting provider on this date.  Electronically Signed: Earley Abide, PA-C 02/22/2018, 9:44 AM   I spent a total of 25 Minutes in face to face in clinical consultation, greater than 50% of which was counseling/coordinating care for left ICA supraclinoid segment stenosis and right MCA M1 segment stenosis.

## 2018-02-25 ENCOUNTER — Encounter (HOSPITAL_COMMUNITY): Payer: Self-pay | Admitting: Interventional Radiology

## 2018-03-19 DIAGNOSIS — G4733 Obstructive sleep apnea (adult) (pediatric): Secondary | ICD-10-CM | POA: Diagnosis not present

## 2018-03-25 DIAGNOSIS — G4733 Obstructive sleep apnea (adult) (pediatric): Secondary | ICD-10-CM | POA: Diagnosis not present

## 2018-04-16 DIAGNOSIS — Z68.41 Body mass index (BMI) pediatric, greater than or equal to 95th percentile for age: Secondary | ICD-10-CM | POA: Diagnosis not present

## 2018-04-16 DIAGNOSIS — Z794 Long term (current) use of insulin: Secondary | ICD-10-CM | POA: Diagnosis not present

## 2018-04-16 DIAGNOSIS — E1165 Type 2 diabetes mellitus with hyperglycemia: Secondary | ICD-10-CM | POA: Diagnosis not present

## 2018-04-18 DIAGNOSIS — G4733 Obstructive sleep apnea (adult) (pediatric): Secondary | ICD-10-CM | POA: Diagnosis not present

## 2018-04-22 DIAGNOSIS — E1165 Type 2 diabetes mellitus with hyperglycemia: Secondary | ICD-10-CM | POA: Diagnosis not present

## 2018-05-08 DIAGNOSIS — Z01419 Encounter for gynecological examination (general) (routine) without abnormal findings: Secondary | ICD-10-CM | POA: Diagnosis not present

## 2018-05-08 DIAGNOSIS — Z6838 Body mass index (BMI) 38.0-38.9, adult: Secondary | ICD-10-CM | POA: Diagnosis not present

## 2018-05-18 DIAGNOSIS — G4733 Obstructive sleep apnea (adult) (pediatric): Secondary | ICD-10-CM | POA: Diagnosis not present

## 2018-05-20 DIAGNOSIS — E1165 Type 2 diabetes mellitus with hyperglycemia: Secondary | ICD-10-CM | POA: Diagnosis not present

## 2018-05-20 DIAGNOSIS — Z6841 Body Mass Index (BMI) 40.0 and over, adult: Secondary | ICD-10-CM | POA: Diagnosis not present

## 2018-06-24 DIAGNOSIS — R0981 Nasal congestion: Secondary | ICD-10-CM | POA: Diagnosis not present

## 2018-06-24 DIAGNOSIS — E1165 Type 2 diabetes mellitus with hyperglycemia: Secondary | ICD-10-CM | POA: Diagnosis not present

## 2018-06-24 DIAGNOSIS — Z6838 Body mass index (BMI) 38.0-38.9, adult: Secondary | ICD-10-CM | POA: Diagnosis not present

## 2018-08-14 ENCOUNTER — Other Ambulatory Visit (HOSPITAL_COMMUNITY): Payer: Self-pay | Admitting: Family Medicine

## 2018-08-14 DIAGNOSIS — Z1231 Encounter for screening mammogram for malignant neoplasm of breast: Secondary | ICD-10-CM

## 2018-08-19 DIAGNOSIS — Z6837 Body mass index (BMI) 37.0-37.9, adult: Secondary | ICD-10-CM | POA: Diagnosis not present

## 2018-08-19 DIAGNOSIS — I10 Essential (primary) hypertension: Secondary | ICD-10-CM | POA: Diagnosis not present

## 2018-08-19 DIAGNOSIS — E118 Type 2 diabetes mellitus with unspecified complications: Secondary | ICD-10-CM | POA: Diagnosis not present

## 2018-09-26 ENCOUNTER — Ambulatory Visit (HOSPITAL_COMMUNITY)
Admission: RE | Admit: 2018-09-26 | Discharge: 2018-09-26 | Disposition: A | Discharge status: Home / Self Care | Payer: BLUE CROSS/BLUE SHIELD | Source: Ambulatory Visit | Attending: Family Medicine | Admitting: Family Medicine

## 2018-09-26 DIAGNOSIS — Z1231 Encounter for screening mammogram for malignant neoplasm of breast: Secondary | ICD-10-CM | POA: Diagnosis not present

## 2018-10-25 ENCOUNTER — Telehealth (INDEPENDENT_AMBULATORY_CARE_PROVIDER_SITE_OTHER): Payer: BLUE CROSS/BLUE SHIELD | Admitting: Cardiology

## 2018-10-25 VITALS — BP 131/83 | HR 83 | Ht 66.5 in | Wt 216.0 lb

## 2018-10-25 DIAGNOSIS — I10 Essential (primary) hypertension: Secondary | ICD-10-CM

## 2018-10-25 DIAGNOSIS — R0789 Other chest pain: Secondary | ICD-10-CM | POA: Diagnosis not present

## 2018-10-25 DIAGNOSIS — E782 Mixed hyperlipidemia: Secondary | ICD-10-CM

## 2018-10-25 NOTE — Patient Instructions (Signed)

## 2018-10-25 NOTE — Progress Notes (Signed)
Virtual Visit via Video Note    Evaluation Performed:  Follow-up visit  This visit type was conducted due to national recommendations for restrictions regarding the COVID-19 Pandemic (e.g. social distancing).  This format is felt to be most appropriate for this patient at this time.  All issues noted in this document were discussed and addressed.  No physical exam was performed (except for noted visual exam findings with Video Visits).  Please refer to the patient's chart (MyChart message for video visits and phone note for telephone visits) for the patient's consent to telehealth for [facility].  Date:  10/25/2018   ID:  Jill [name], DOB 1966/06/27, MRN [id number]  Patient Location:  [name], [name]  Provider location:   [place], [place]  PCP:  Iona Beard, MD  Cardiologist:  Dr Carlyle Dolly Electrophysiologist:  None   Chief Complaint:  1 year follow up  History of Present Illness:    Jill [name] is a 53 y.o. female who presents via audio/video conferencing for a telehealth visit today. Seen today for follow up of the following medical problems.    1. Orthostatic dizziness - last visit reported significant symptoms of dizziness with standing. Was found to be orthostatic in clinic - since changing lasix to prn only, symptosm have resolved. Maintaining adequate hydration.    - denies any recent symptoms.   2. Obesity - history of bariatric surgery,   3. Abnormal stress test/Chest pain - as part of a bariatric surgery preop workup she had a nuclear stress that was abnormal - follow up cath 09/2014 showed mild to moderate non-obstructive disease.   - no recent ches tpain since last visit.    4. Hyperlipidemia   - compliant with meds, labs followed by pcp  5. HTN   - compliant with meds  6. PAD - prior right lower intervention with PTA and stenting of right common iliac and right SFA in Jan 2010 -  Denies any claudication symptoms  7. OSA  - remains compliant with CPAP   8. Carotid stenosis - followed by vascular. History of left ICA angioplasty 09/2014 for which she remains on ASA and plavix for.     The patient does not have symptoms concerning for COVID-19 infection (fever, chills, cough, or new shortness of breath).    Prior CV studies:   The following studies were reviewed today:  08/2014 Lexiscan MPI IMPRESSION: 1. Large area of ischemia extending from the basal to distal anteroseptal wall.  2. Normal left ventricular wall motion.  3. Left ventricular ejection fraction 60 for%  4. High-risk stress test findings*.  09/2014 Cath HEMODYNAMICS: Aortic pressure was 142/66; LV pressure was 144/7; LVEDP 18. There was no gradient between the left ventricle and aorta.   ANGIOGRAPHIC DATA: The left main coronary artery is widely patent.  The left anterior descending artery is a large vessel which wraps around the apex. There is mild atherosclerosis in the mid vessel. There is a large diagonal vessel which arises proximally. There are several small diagonals which are patent.  The left circumflex artery is a large dominant vessel. There is a ramus vessel which is large and widely patent. There is mild disease in the circumflex system. The first obtuse marginal is small but patent. There is a small left PDA which is patent.  The right coronary artery is a nondominant vessel. In the mid vessel, there is moderate disease.  LEFT VENTRICULOGRAM: Left ventricular angiogram was not done due to the question of  a dye allergy. LVEDP was 18 mmHg.  IMPRESSIONS:  1. Normal left main coronary artery. 2. Mild disease in the left anterior descending artery and its branches. 3. Mild disease in the dominant left circumflex artery and its branches. 4. Moderate disease in the nondominant right coronary artery. 5. Left ventricular systolic function not assessed. LVEDP 18 mmHg. 6. False positive nuclear stress  test.  RECOMMENDATION: Continue aggressive preventive therapy given her history of peripheral vascular disease and multiple risk factors for coronary atherosclerosis. She will follow-up with Dr. Harl Bowie.  Past Medical History:  Diagnosis Date  . Anxiety   . Back pain    complains when laying on hard flat surface  . Chronic kidney disease    appt. /w urology 11/10/2014- for a cyst seen on [place]. evaluated was told it was nothing.  . Diabetes mellitus    Diabetes II, oral and insulin.  Marland Kitchen DVT (deep venous thrombosis) (HCC)    right leg '09 or '10  . Fibroids   . GERD (gastroesophageal reflux disease)   . Heart murmur   . History of stress test    referred for cardiac cath- done here at [facility]- 09/2014  . Hypercholesterolemia   . Hypertension   . PONV (postoperative nausea and vomiting)   . Seizures ([place])    04/2010 x1- Dr. Colan Neptune x2 yearly.  . Sleep apnea    CPAP q night , last study > 3 yrs. ago  . Stroke [facility])    '09-had blockage in brain, too deep to operate"occ left lower extremity fatique, occ. 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   Past Surgical History:  Procedure Laterality Date  . APPENDECTOMY    . CARDIAC CATHETERIZATION    . [place]   x2  . CHOLECYSTECTOMY     open"gallstones"  . DILATATION & CURETTAGE/HYSTEROSCOPY WITH MYOSURE N/A 05/10/2017   Procedure: DILATATION & CURETTAGE/HYSTEROSCOPY WITH MYOSURE;  Surgeon: Servando Salina, MD;  Location: [facility];  Service: Gynecology;  Laterality: N/A;  . INTERVENTIONAL RADIOLOGY PROCEDURE     CT angiography /neck -Dr. Estanislado Pandy.  . IR ANGIO INTRA EXTRACRAN SEL COM CAROTID INNOMINATE BILAT MOD SED  02/22/2018  . IR ANGIO VERTEBRAL SEL VERTEBRAL BILAT MOD SED  02/22/2018  . IR GENERIC HISTORICAL  08/03/2016   IR RADIOLOGIST EVAL & MGMT 08/03/2016 [facility]INTER[facility] RAD  . LAPAROSCOPIC GASTRIC SLEEVE RESECTION N/A 04/10/2016    Procedure: LAPAROSCOPIC GASTRIC SLEEVE RESECTION WITH UPPER ENDO;  Surgeon: Greer Pickerel, MD;  Location: [facility];  Service: General;  Laterality: N/A;  . LEFT HEART CATHETERIZATION WITH CORONARY ANGIOGRAM N/A 09/28/2014   Procedure: LEFT HEART CATHETERIZATION WITH CORONARY ANGIOGRAM;  Surgeon: Jettie Booze, MD;  Location: [facility] CATH LAB;  Service: Cardiovascular;  Laterality: N/A;  . RADIOLOGY WITH ANESTHESIA N/A 10/14/2014   Procedure: ANGIOPLASTY;  Surgeon: Luanne Bras, MD;  Location: [place];  Service: Radiology;  Laterality: N/A;  . TUBAL LIGATION       No outpatient medications have been marked as taking for the 10/25/18 encounter (Appointment) with [name], MD.     Allergies:   Dilaudid [hydromorphone hcl]; Otho Darner allergy]; and Other   Social History   Tobacco Use  . Smoking status: Current Some Day Smoker    Packs/day: 0.50    Years: 41.00    Pack years: 20.50    Types: Cigarettes    Start  date: 07/25/1975  . Smokeless tobacco: Never Used  Substance Use Topics  . Alcohol use: Yes    Alcohol/week: 0.0 standard drinks    Comment: rare use   . Drug use: No     Family Hx: The patient's family history includes Heart attack in her father and mother; Heart disease in her father; Heart failure in her father; Kidney disease in her mother; Stroke in her paternal grandmother.  ROS:   Please see the history of present illness.    All other systems reviewed and are negative.   Labs/Other Tests and Data Reviewed:    Recent Labs: 02/22/2018: BUN 15; Creatinine, Ser 0.57; Hemoglobin 14.7; Platelets 194; Potassium 3.8; Sodium 139   Recent Lipid Panel Lab Results  Component Value Date/Time   CHOL 129 05/20/2011 08:49 AM   TRIG 97 05/20/2011 08:49 AM   HDL 40 05/20/2011 08:49 AM   CHOLHDL 3.2 05/20/2011 08:49 AM   LDLCALC 70 05/20/2011 08:49 AM    Wt Readings from Last 3 Encounters:  02/22/18 245 lb (111.1 kg)  06/27/17 248 lb 3.2 oz (112.6 kg)   05/10/17 240 lb (108.9 kg)     Objective:    Vital Signs:  LMP 12/10/2014 Comment: pt states that there is no chance or pregnancy   Well nourished, well developed female in no  acute distress. Normal affect, normal speech pattern  ASSESSMENT & PLAN:     1. Abnormal stress test/Chest pain - negative cath 09/2014, no recent symptoms - no recent symptoms, continue current meds  2. Hyperlipidemia - continue statin, request labs from pcp  3. HTN -continue current meds, home bp at goal  4. PAD - no symptoms, continue current meds  5.. Hx of CVA - known cerebral vascular disease, s/p carotid angiogplasty.  - continue ASA and plavix.     COVID-19 Education: The signs and symptoms of COVID-19 were discussed with the patient and how to seek care for testing (follow up with PCP or arrange E-visit).  The importance of social distancing was discussed today.  Patient Risk:   After full review of this patient's clinical status, I feel that they are at least moderate risk at this time.  Time:   Today, I have spent 15 minutes with the patient with telehealth technology discussing the above medical problems.     Medication Adjustments/Labs and Tests Ordered: Current medicines are reviewed at length with the patient today.  Concerns regarding medicines are outlined above.  Tests Ordered: No orders of the defined types were placed in this encounter.  Medication Changes: No orders of the defined types were placed in this encounter.   Disposition:  Follow up 1 year  Signed, Carlyle Dolly, MD  10/25/2018 11:10 AM    Trainer

## 2018-11-18 DIAGNOSIS — R785 Finding of other psychotropic drug in blood: Secondary | ICD-10-CM | POA: Diagnosis not present

## 2018-11-18 DIAGNOSIS — I10 Essential (primary) hypertension: Secondary | ICD-10-CM | POA: Diagnosis not present

## 2018-11-18 DIAGNOSIS — E1169 Type 2 diabetes mellitus with other specified complication: Secondary | ICD-10-CM | POA: Diagnosis not present

## 2019-02-11 DIAGNOSIS — I10 Essential (primary) hypertension: Secondary | ICD-10-CM | POA: Diagnosis not present

## 2019-02-11 DIAGNOSIS — E785 Hyperlipidemia, unspecified: Secondary | ICD-10-CM | POA: Diagnosis not present

## 2019-02-11 DIAGNOSIS — E1169 Type 2 diabetes mellitus with other specified complication: Secondary | ICD-10-CM | POA: Diagnosis not present

## 2019-02-27 DIAGNOSIS — G4733 Obstructive sleep apnea (adult) (pediatric): Secondary | ICD-10-CM | POA: Diagnosis not present

## 2019-02-27 DIAGNOSIS — E1169 Type 2 diabetes mellitus with other specified complication: Secondary | ICD-10-CM | POA: Diagnosis not present

## 2019-02-27 DIAGNOSIS — I10 Essential (primary) hypertension: Secondary | ICD-10-CM | POA: Diagnosis not present

## 2019-02-27 DIAGNOSIS — E669 Obesity, unspecified: Secondary | ICD-10-CM | POA: Diagnosis not present

## 2019-02-27 DIAGNOSIS — E7800 Pure hypercholesterolemia, unspecified: Secondary | ICD-10-CM | POA: Diagnosis not present

## 2019-02-27 DIAGNOSIS — Z9884 Bariatric surgery status: Secondary | ICD-10-CM | POA: Diagnosis not present

## 2019-02-27 DIAGNOSIS — Z9989 Dependence on other enabling machines and devices: Secondary | ICD-10-CM | POA: Diagnosis not present

## 2019-02-27 DIAGNOSIS — Z8673 Personal history of transient ischemic attack (TIA), and cerebral infarction without residual deficits: Secondary | ICD-10-CM | POA: Diagnosis not present

## 2019-02-27 DIAGNOSIS — K5900 Constipation, unspecified: Secondary | ICD-10-CM | POA: Diagnosis not present

## 2019-02-27 DIAGNOSIS — K219 Gastro-esophageal reflux disease without esophagitis: Secondary | ICD-10-CM | POA: Diagnosis not present

## 2019-03-14 DIAGNOSIS — G4733 Obstructive sleep apnea (adult) (pediatric): Secondary | ICD-10-CM | POA: Diagnosis not present

## 2019-07-04 ENCOUNTER — Other Ambulatory Visit: Payer: Self-pay

## 2019-07-04 DIAGNOSIS — Z20822 Contact with and (suspected) exposure to covid-19: Secondary | ICD-10-CM

## 2019-07-05 ENCOUNTER — Ambulatory Visit: Payer: Self-pay

## 2019-07-05 LAB — NOVEL CORONAVIRUS, NAA: SARS-CoV-2, NAA: NOT DETECTED

## 2019-07-05 NOTE — Telephone Encounter (Signed)
Patient would like to know if she should be quarantining while awaiting her covid results due to being around someone who has tested positive. Please advise-  Advised to to stay in quarantine.

## 2019-07-05 NOTE — Telephone Encounter (Signed)
  Reason for Disposition . General information question, no triage required and triager able to answer question  Protocols used: INFORMATION ONLY CALL-A-AH

## 2019-07-23 DIAGNOSIS — N764 Abscess of vulva: Secondary | ICD-10-CM | POA: Diagnosis not present

## 2019-07-29 DIAGNOSIS — K629 Disease of anus and rectum, unspecified: Secondary | ICD-10-CM | POA: Diagnosis not present

## 2019-08-04 DIAGNOSIS — Z9884 Bariatric surgery status: Secondary | ICD-10-CM | POA: Diagnosis not present

## 2019-08-04 DIAGNOSIS — K5909 Other constipation: Secondary | ICD-10-CM | POA: Diagnosis not present

## 2019-08-04 DIAGNOSIS — F17209 Nicotine dependence, unspecified, with unspecified nicotine-induced disorders: Secondary | ICD-10-CM | POA: Diagnosis not present

## 2019-08-04 DIAGNOSIS — K610 Anal abscess: Secondary | ICD-10-CM | POA: Diagnosis not present

## 2019-08-13 ENCOUNTER — Other Ambulatory Visit (HOSPITAL_COMMUNITY): Payer: Self-pay | Admitting: Family Medicine

## 2019-08-13 DIAGNOSIS — Z1231 Encounter for screening mammogram for malignant neoplasm of breast: Secondary | ICD-10-CM

## 2019-08-25 DIAGNOSIS — Z8673 Personal history of transient ischemic attack (TIA), and cerebral infarction without residual deficits: Secondary | ICD-10-CM | POA: Diagnosis not present

## 2019-08-25 DIAGNOSIS — Z7901 Long term (current) use of anticoagulants: Secondary | ICD-10-CM | POA: Diagnosis not present

## 2019-08-25 DIAGNOSIS — K603 Anal fistula: Secondary | ICD-10-CM | POA: Diagnosis not present

## 2019-08-25 DIAGNOSIS — Z9989 Dependence on other enabling machines and devices: Secondary | ICD-10-CM | POA: Diagnosis not present

## 2019-08-25 DIAGNOSIS — G4733 Obstructive sleep apnea (adult) (pediatric): Secondary | ICD-10-CM | POA: Diagnosis not present

## 2019-08-25 DIAGNOSIS — K5909 Other constipation: Secondary | ICD-10-CM | POA: Diagnosis not present

## 2019-08-26 ENCOUNTER — Ambulatory Visit: Payer: Self-pay | Admitting: Surgery

## 2019-08-26 NOTE — H&P (Signed)
Jill [name] Documented: 08/25/2019 3:19 PM Location: [facility] Patient #: [id number] DOB: [birth year] Married / Language: English / Race: Black or African American Female  History of Present Illness Jill Hector MD; 08/26/2019 8:26 AM) The patient is a 54 year old female who presents with anal lesions. Note for "Anal lesions": ` ` ` Patient sent for surgical consultation at the request of Avon Gully, NP / Dr Ronita Hipps  Chief Complaint: Perianal drainage. Concern for mass versus fistula ` ` The patient is has been followed by Dr. Redmond Pulling in our group after weight loss / Bariatric surgery. Noted some perianal discomfort and drainage. Saw gynecology. Placed on Bactrim. Seems improved with concern for possible abscess or fistula. Therefore Surgical consultation requested. Patient does smoke. She does have a history of skin infections/boils. He is to be in her armpits and occasionally in her groins. Not a major issue recently. She's lost about 100 pounds intentionally after the gastric sleeve surgery. She has had a negative cologuard. No colonoscopies recently. He is anticoagulated on Plavix for history of stroke. Followed by Dr. Harl Bowie with cardiology. She finished a 10 day course of Bactrim. She felt like the boils became smaller but did not go away. Strictly painful but she feels like 1 areas draining. She tends to have hard constipated stools. He is to move her bowels every day but since the gastric sleeve now goes about twice a week. She is not on any MiraLAX anymore. She is diabetic but no longer insulin requiring.  She came to me for a chronic draining sinus perianally. I suspected a fistula. We felt a reasonable compromise was to do antibiotic for a few more weeks to see if it would calm down on its own. I switched to Augmentin 875. She called back noting that things are much better. She tells me she was given a refill of 500s (not it our records).  Nonetheless, she finished 3 weeks of antibiotics as of yesterday. She notes that the areas no longer painful. She doesn't think she gets much drainage anymore. She is hoping that it is healed.  (Review of systems as stated in this history (HPI) or in the review of systems. 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 Contrast Allergy PreMed Pack *CORTICOSTEROIDS* Allergies Reconciled  Medication History (Chanel Teressa Senter, CMA; 08/25/2019 3:21 PM) Felodipine ER (5MG  Tablet ER 24HR, Oral) Active. Actos (30MG  Tablet, Oral) Active. Clopidogrel Bisulfate (75MG  Tablet, Oral) Active. Colace (100MG  Capsule, Oral) Active. LamoTRIgine (100MG  Tablet, Oral) Active. LevETIRAcetam (500MG  Tablet, Oral) Active. Benicar (40MG  Tablet, Oral) Active. Rosuvastatin Calcium (20MG  Tablet, Oral) Active. Medications Reconciled  Social History Jill Hector, MD; 08/25/2019 3:27 PM) No drug use No alcohol use Tobacco use Current some day smoker. No caffeine use  Family History Jill Hector, MD; 08/25/2019 3:27 PM) Breast Cancer  Mother. Hypertension Father, Mother. Arthritis  Mother. Respiratory Condition Father.  Pregnancy / Birth History Jill Hector, MD; 08/25/2019 3:27 PM) Maternal age 60-20 Gravida 2 Contraceptive History Oral contraceptives. Irregular periods Age at menarche 38 years. Para 2  Other Problems Jill Hector, MD; 08/25/2019 3:27 PM) Seizure Disorder Anxiety Disorder Depression Cholelithiasis HISTORY OF STROKE (Z86.73) GASTROESOPHAGEAL REFLUX DISEASE, ESOPHAGITIS PRESENCE NOT SPECIFIED (K21.9) HYPERTENSION, ESSENTIAL (I10) OBSTRUCTIVE SLEEP APNEA ON CPAP (G47.33) DIABETES MELLITUS TYPE 2 IN OBESE (E11.69) HYPERCHOLESTEREMIA (E78.00)     Review of Systems Jill Hector, MD; 08/25/2019 3:27 PM) All other systems negative  Vitals (Chanel Nolan CMA; 08/25/2019 3:21 PM) 08/25/2019 3:21 PM Weight: 215 lb Height: 66.5in Body Surface Area: 2.07 m Body Mass Index: 34.18 kg/m  Temp.: 97.91F  Pulse: 93 (Regular)  BP: 136/73 (Sitting, Left Arm, Standard)        Physical Exam Jill Hector MD; 08/26/2019 8:30 AM)  General Mental Status-Alert. General Appearance-Consistent with stated age. Hydration-Well hydrated. Voice-Normal.  Integumentary Global Assessment Upon inspection and palpation of skin surfaces of the - Axillae: non-tender, no inflammation or ulceration, no drainage. and Distribution of scalp and body hair is normal. General Characteristics Temperature - normal warmth is noted.  Head and Neck Head-normocephalic, atraumatic with no lesions or palpable masses. Trachea-midline. Thyroid Gland Characteristics - normal size and consistency.  Eye Eyeball - Bilateral-Normal. Sclera/Conjunctiva - Bilateral-No scleral icterus.  ENMT Ears Pinna - Bilateral - no bony growth in lateral aspect of ear canal, no drainage observed. Note:normal ext ears lips intact Nose and Sinuses External Inspection of the Nose - symmetric, no deformities observed.  Chest and Lung  Exam Chest and lung exam reveals -quiet, even and easy respiratory effort with no use of accessory muscles and on auscultation, normal breath sounds, no adventitious sounds and normal vocal resonance. Inspection Chest Wall - Normal. Back - normal.  Breast - Did not examine.  Cardiovascular Cardiovascular examination reveals -normal heart sounds, regular rate and rhythm with no murmurs and normal pedal pulses bilaterally.  Abdomen Inspection Inspection of the abdomen reveals - No Hernias. Skin - Scar - Note: well healed trocar scars. Palpation/Percussion Palpation and Percussion of the abdomen reveal - Soft, Non Tender, No Rebound tenderness, No Rigidity (guarding) and No hepatosplenomegaly. Auscultation Auscultation of the abdomen reveals - Bowel sounds normal.  Female Genitourinary Sexual Maturity Tanner 5 - Adult hair pattern. Note: No vaginal bleeding nor discharge  Rectal Note: Right posterior 15x 15 mm perianal subcutaneous mass with sinus opening suspicious for chronic perirectal fistula. Overall inflammation decreased compared to last visit. No fissure. No active abscess. No external hemorrhoids. Sphincter tone appears to be normal. Less pain and swelling in the right anterior aspect.  I held off on any internal exam   Peripheral Vascular Upper Extremity Palpation - Pulses bilaterally normal.  Neurologic Neurologic evaluation reveals -alert and oriented x 3 with no impairment of recent or remote memory. Mental Status-Normal.  Neuropsychiatric The patient's mood and affect are described as -normal. Judgment and Insight-insight is appropriate concerning matters relevant to self.  Musculoskeletal Normal Exam - Left-Upper Extremity Strength Normal and Lower Extremity Strength Normal. Normal Exam - Right-Upper Extremity Strength Normal and Lower Extremity Strength Normal.  Lymphatic Head & Neck General Head & Neck Lymphatics: Bilateral -  Description - Normal. Axillary - Did not examine. Femoral & Inguinal - Did not examine.    Assessment & Plan Jill Hector MD; 08/26/2019 8:28 AM)  ANAL FISTULA (K60.3) Impression: Persistent right  posterior perianal wound suspicious for fistula.  I think she would benefit from operative exploration. Most likely this will require LIFT repair as I do not feel that it is superficial. There is a small chance this could just be a chronic perianal cyst/sinus with the need to excise with an open wound. The fact that she still has a draining sinus makes me much more suspicious that this is an anal fistula.  I explained the need for surgery and differential diagnosis. She was somewhat taken back, hoping that it had healed. She wishes to discuss with her husband first but is leaning towards surgery. I explained the pathophysiology and the technique of surgeres. I did caution that she will have some perianal pain for especially the first few weeks and then a chronic wound that can take a couple months to totally heal up if a LIFT anal fistula repair is needed. She had many appropriate questions which I worked to answer her satisfaction. She wishes to discuss with her husband first.  Current Plans Pt Education - CCS Abscess/Fistula (AT): discussed with patient and provided information. The anatomy & physiology of the anorectal region was discussed. We discussed the pathophysiology of anorectal abscess and fistula. Differential diagnosis was discussed. Natural history progression was discussed. I stressed the importance of a bowel regimen to have daily soft bowel movements to minimize progression of disease.  The patient's condition is not adequately controlled. Non-operative treatment has not healed the fistula. Therefore, I recommended examination under anaesthesia to confirm the diagnosis and treat the fistula. I discussed techniques that may be required such as fistulotomy, ligation by LIFT  technique, and/or seton placement. Benefits & alternatives discussed. I noted a good likelihood this will help address the problem, but sometimes repeat operations and prolonged healing times may occur. Risks such as bleeding, pain, recurrence, reoperation, incontinence, heart attack, death, and other risks were discussed.  Educational handouts further explaining the pathology, treatment options, and bowel regimen were given. The patient expressed understanding & wishes to proceed. We will work to coordinate surgery for a mutually convenient time.  Started Amoxicillin-Pot Clavulanate 875-125 MG Oral Tablet, 1 (one) Tablet two times daily, #14, 7 days starting 08/25/2019, Ref. x2.  ANTICOAGULATED (Z79.01) Impression: On aspirin and Plavix. No recurrent strokes on her anticoagulation regimen. We had preoperatively obtain cardiac clearance and have been told it is safe to come off Plavix 5 days before surgery which is customary. Hopefully can resume postop day 1.  Current Plans I recommended obtaining preoperative cardiac clearance. I am concerned about the health of the patient and the ability to tolerate the operation. Therefore, we will request clearance by cardiology to better assess operative risk & see if a reevaluation, further workup, etc is needed. Also recommendations on how medications such as for anticoagulation and blood pressure should be managed/held/restarted after surgery. Pt Education - CCS Hold anticoagulation preoperatively  ENCOUNTER FOR PREOPERATIVE EXAMINATION FOR GENERAL SURGICAL PROCEDURE (Z01.818)  Current Plans You are being scheduled for surgery- Our schedulers will call you.  You should hear from our office's scheduling department within 5 working days about the location, date, and time of surgery. We try to make accommodations for patient's preferences in scheduling surgery, but sometimes the OR schedule or the surgeon's schedule prevents [place] from making  those accommodations.  If you have not heard from our office [phone]) in 5 working days, call the office and ask for your surgeon's nurse.  If you have other questions about your diagnosis, plan, or  surgery, call the office and ask for your surgeon's nurse.  Pt Education - CCS Rectal Prep for Anorectal outpatient/office surgery: discussed with patient and provided information. Pt Education - CCS Rectal Surgery HCI (Doranne Schmutz): discussed with patient and provided information. Pt Education - CCS Good Bowel Health (Saphyre Cillo)  CHRONIC CONSTIPATION (K59.09) Impression: I strongly recommend she do a bowel regimen to move her bowels every day as a way to prevent future anorectal problems and help her get through the surgery safely  Current Plans Pt Education - CCS Constipation (AT) Pt Education - CCS Good Bowel Health (Aneira Cavitt)  OSA ON CPAP (G47.33)   HISTORY OF STROKE (Z86.73) Impression: On aspirin and Plavix.

## 2019-09-09 DIAGNOSIS — Z7189 Other specified counseling: Secondary | ICD-10-CM | POA: Diagnosis not present

## 2019-09-09 DIAGNOSIS — E1169 Type 2 diabetes mellitus with other specified complication: Secondary | ICD-10-CM | POA: Diagnosis not present

## 2019-09-09 DIAGNOSIS — I10 Essential (primary) hypertension: Secondary | ICD-10-CM | POA: Diagnosis not present

## 2019-09-09 DIAGNOSIS — R569 Unspecified convulsions: Secondary | ICD-10-CM | POA: Diagnosis not present

## 2019-09-17 DIAGNOSIS — Z23 Encounter for immunization: Secondary | ICD-10-CM | POA: Diagnosis not present

## 2019-10-02 ENCOUNTER — Ambulatory Visit (HOSPITAL_COMMUNITY): Payer: BC Managed Care – PPO

## 2019-10-02 ENCOUNTER — Ambulatory Visit (HOSPITAL_COMMUNITY)
Admission: RE | Admit: 2019-10-02 | Discharge: 2019-10-02 | Disposition: A | Discharge status: Home / Self Care | Payer: BC Managed Care – PPO | Source: Ambulatory Visit | Attending: Family Medicine | Admitting: Family Medicine

## 2019-10-02 ENCOUNTER — Other Ambulatory Visit: Payer: Self-pay

## 2019-10-02 DIAGNOSIS — Z1231 Encounter for screening mammogram for malignant neoplasm of breast: Secondary | ICD-10-CM | POA: Diagnosis not present

## 2019-10-15 DIAGNOSIS — Z23 Encounter for immunization: Secondary | ICD-10-CM | POA: Diagnosis not present

## 2019-11-27 ENCOUNTER — Ambulatory Visit: Payer: BC Managed Care – PPO | Admitting: Family Medicine

## 2019-11-27 NOTE — Progress Notes (Deleted)
Cardiology Office Note  Date: 11/27/2019   ID: Jill, [name] 1966/04/21, MRN [id number]  PCP:  Iona Beard, MD  Cardiologist:  Carlyle Dolly, MD Electrophysiologist:  None   Chief Complaint: Follow-up orthostatic dizziness, obesity, abnormal stress test/chest pain, hyperlipidemia, hypertension, PAD, OSA, carotid artery stenosis  History of Present Illness: Jill [name] is a 54 y.o. female with a history of orthostatic dizziness, obesity, abnormal stress test/chest pain, hyperlipidemia, hypertension, PAD, OSA, carotid artery stenosis, PVD 125 because of the increased  and previous history of bariatric surgery  Last saw Dr. Harl Bowie on 10/25/2018 via telemedicine.  Her orthostatic dizziness was stable and denied any recent symptoms. Cardiac catheterization 2016 showed mild to moderate nonobstructive disease.  She was experiencing no recent chest pain.  She was compliant with her statin and blood pressure medications.  PAD was stable status post PCTA and stenting of right common iliac and right SFA in January 2010.  She denied any claudication symptoms.  She remained compliant with her CPAP for OSA.  Her carotid artery stenosis was stable and she remained aspirin and Plavix.  History of left ICA angioplasty in 2016  Past Medical History:  Diagnosis Date  . Anxiety   . Back pain    complains when laying on hard flat surface  . Chronic kidney disease    appt. /w urology 11/10/2014- for a cyst seen on [place]. evaluated was told it was nothing.  . Diabetes mellitus    Diabetes II, oral and insulin.  Marland Kitchen DVT (deep venous thrombosis) (HCC)    right leg '09 or '10  . Fibroids   . GERD (gastroesophageal reflux disease)   . Heart murmur   . History of stress test    referred for cardiac cath- done here at [facility]- 09/2014  . Hypercholesterolemia   . Hypertension   . PONV (postoperative nausea and vomiting)   . Seizures ([place])    04/2010 x1- Dr. Colan Neptune x2 yearly.  . Sleep apnea    CPAP q night , last study > 3 yrs. ago  . Stroke [facility])    '09-had blockage in brain, too deep to operate"occ left lower extremity fatique, occ. 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 APPENDECTOMY    . CARDIAC CATHETERIZATION    . [place]   x2  . CHOLECYSTECTOMY     open"gallstones"  . DILATATION & CURETTAGE/HYSTEROSCOPY WITH MYOSURE N/A 05/10/2017   Procedure: DILATATION & CURETTAGE/HYSTEROSCOPY WITH MYOSURE;  Surgeon: Servando Salina, MD;  Location: [facility];  Service: Gynecology;  Laterality: N/A;  . INTERVENTIONAL RADIOLOGY PROCEDURE     CT angiography /neck -Dr. Estanislado Pandy.  . IR ANGIO INTRA EXTRACRAN SEL COM CAROTID INNOMINATE BILAT MOD SED  02/22/2018  . IR ANGIO VERTEBRAL SEL VERTEBRAL BILAT MOD SED  02/22/2018  . IR GENERIC HISTORICAL  08/03/2016   IR RADIOLOGIST EVAL & MGMT 08/03/2016 [facility]-INTERV RAD  . LAPAROSCOPIC GASTRIC SLEEVE RESECTION N/A 04/10/2016   Procedure: LAPAROSCOPIC GASTRIC SLEEVE RESECTION WITH UPPER ENDO;  Surgeon: Greer Pickerel, MD;  Location: [facility];  Service: General;  Laterality: N/A;  . LEFT HEART CATHETERIZATION WITH CORONARY ANGIOGRAM N/A 09/28/2014   Procedure: LEFT HEART CATHETERIZATION WITH CORONARY ANGIOGRAM;  Surgeon: Jettie Booze, MD;  Location: [facility] CATH LAB;  Service: Cardiovascular;  Laterality: N/A;  . RADIOLOGY  WITH ANESTHESIA N/A 10/14/2014   Procedure: ANGIOPLASTY;  Surgeon: Luanne Bras, MD;  Location: [place];  Service: Radiology;  Laterality: N/A;  . TUBAL LIGATION      Current Outpatient Medications  Medication Sig Dispense Refill  . acetaminophen (TYLENOL) 500 MG tablet Take 500-1,000 mg by mouth every 8 (eight) hours as needed for moderate pain or headache.     Marland Kitchen aspirin EC 81 MG tablet Take 81 mg by mouth every morning.    . calcium citrate-vitamin D 500-400 MG-UNIT  chewable tablet Chew 2 tablets by mouth 2 (two) times daily.    . clopidogrel (PLAVIX) 75 MG tablet Take 75 mg by mouth every morning.    . Cyanocobalamin (VITAMIN B-12) 5000 MCG SUBL Place 5,000 mcg under the tongue 2 (two) times a week.     . docusate sodium (COLACE) 100 MG capsule Take 100 mg by mouth daily.    . felodipine (PLENDIL) 5 MG 24 hr tablet Take 5 mg by mouth daily.     Marland Kitchen gabapentin (NEURONTIN) 300 MG capsule Take 300 mg by mouth at bedtime.   0  . ibuprofen (ADVIL,MOTRIN) 800 MG tablet Take 1 tablet (800 mg total) by mouth every 8 (eight) hours as needed. (Patient taking differently: Take 800 mg by mouth every 8 (eight) hours as needed for headache or moderate pain. ) 30 tablet 0  . insulin aspart (NOVOLOG) 100 UNIT/ML injection Inject 5-10 Units into the skin 3 (three) times daily with meals. Per sliding scale    . lamoTRIgine (LAMICTAL) 100 MG tablet Take 100 mg by mouth every morning.     . levETIRAcetam (KEPPRA) 500 MG tablet Take 250 mg by mouth every 12 (twelve) hours.    Marland Kitchen LORazepam (ATIVAN) 1 MG tablet Take 1 mg by mouth daily as needed for anxiety.    . metFORMIN (GLUCOPHAGE) 500 MG tablet Take 500 mg by mouth 2 (two) times daily with a meal.    . Multiple Vitamin (MULITIVITAMIN WITH MINERALS) TABS Take 1 tablet by mouth daily.     Marland Kitchen olmesartan (BENICAR) 40 MG tablet Take 40 mg by mouth every morning.     . pantoprazole (PROTONIX) 40 MG tablet Take 40 mg by mouth daily.     . pioglitazone (ACTOS) 15 MG tablet Take 15 mg by mouth every morning.     . rosuvastatin (CRESTOR) 20 MG tablet Take 20 mg by mouth at bedtime.     . Semaglutide (OZEMPIC, 1 MG/DOSE, [place]) Inject into the skin.     No current facility-administered medications for this visit.   Allergies:  Dilaudid [hydromorphone hcl], Crab [shellfish allergy], and Other   Social History: The patient  reports that she has been smoking cigarettes. She started smoking about 44 years ago. She has a 20.50 pack-year smoking  history. She has never used smokeless tobacco. She reports current alcohol use. She reports that she does not use drugs.   Family History: The patient's family history includes Heart attack in her father and mother; Heart disease in her father; Heart failure in her father; Kidney disease in her mother; Stroke in her paternal grandmother.   ROS:  Please see the history of present illness. Otherwise, complete review of systems is positive for {NONE DEFAULTED:18576::"none"}.  All other systems are reviewed and negative.   Physical Exam: VS:  LMP 12/10/2014 Comment: pt states that there is no chance or pregnancy, BMI There is no height or weight on file to calculate BMI.  Wt Readings  from Last 3 Encounters:  10/25/18 216 lb (98 kg)  02/22/18 245 lb (111.1 kg)  06/27/17 248 lb 3.2 oz (112.6 kg)    General: Patient appears comfortable at rest. HEENT: Conjunctiva and lids normal, oropharynx clear with moist mucosa. Neck: Supple, no elevated JVP or carotid bruits, no thyromegaly. Lungs: Clear to auscultation, nonlabored breathing at rest. Cardiac: Regular rate and rhythm, no S3 or significant systolic murmur, no pericardial rub. Abdomen: Soft, nontender, no hepatomegaly, bowel sounds present, no guarding or rebound. Extremities: No pitting edema, distal pulses 2+. Skin: Warm and dry. Musculoskeletal: No kyphosis. Neuropsychiatric: Alert and oriented x3, affect grossly appropriate.  ECG:  {EKG/Telemetry Strips Reviewed:[id number]}  Recent Labwork: No results found for requested labs within last 8760 hours.     Component Value Date/Time   CHOL 129 05/20/2011 0849   TRIG 97 05/20/2011 0849   HDL 40 05/20/2011 0849   CHOLHDL 3.2 05/20/2011 0849   VLDL 19 05/20/2011 0849   LDLCALC 70 05/20/2011 0849    Other Studies Reviewed Today:  08/2014 Lexiscan MPI IMPRESSION: 1. Large area of ischemia extending from the basal to distal anteroseptal wall.  2. Normal left ventricular wall  motion.  3. Left ventricular ejection fraction 60 for%  4. High-risk stress test findings*.   09/2014 Cath HEMODYNAMICS: Aortic pressure was 142/66; LV pressure was 144/7; LVEDP 18. There was no gradient between the left ventricle and aorta.   ANGIOGRAPHIC DATA: The left main coronary artery is widely patent.  The left anterior descending artery is a large vessel which wraps around the apex. There is mild atherosclerosis in the mid vessel. There is a large diagonal vessel which arises proximally. There are several small diagonals which are patent.  The left circumflex artery is a large dominant vessel. There is a ramus vessel which is large and widely patent. There is mild disease in the circumflex system. The first obtuse marginal is small but patent. There is a small left PDA which is patent.  The right coronary artery is a nondominant vessel. In the mid vessel, there is moderate disease.  LEFT VENTRICULOGRAM: Left ventricular angiogram was not done due to the question of a dye allergy. LVEDP was 18 mmHg.  IMPRESSIONS:  1. Normal left main coronary artery. 2. Mild disease in the left anterior descending artery and its branches. 3. Mild disease in the dominant left circumflex artery and its branches. 4. Moderate disease in the nondominant right coronary artery. 5. Left ventricular systolic function not assessed. LVEDP 18 mmHg.                                6. False positive nuclear stress test.  RECOMMENDATION: Continue aggressive preventive therapy given her history of peripheral vascular disease and multiple risk factors for coronary atherosclerosis. She will follow-up with Dr. Harl Bowie.   Assessment and Plan:  1. Other chest pain   2. Mixed hyperlipidemia   3. Essential hypertension   4. PAD (peripheral artery disease) ([place])   5. History of CVA (cerebrovascular accident) without residual deficits    1. Other chest pain ***  2. Mixed  hyperlipidemia ***  3. Essential hypertension ***  4. PAD (peripheral artery disease) (HCC) ***  5. History of CVA (cerebrovascular accident) without residual deficits ***  Medication Adjustments/Labs and Tests Ordered: Current medicines are reviewed at length with the patient today.  Concerns regarding medicines are outlined above.   Disposition: Follow-up with Dr  Branch or APP  Signed, Levell July, NP 11/27/2019 7:59 AM    [facility], [place], [place] [address] Phone: [phone]; Fax: [phone]

## 2019-12-01 ENCOUNTER — Other Ambulatory Visit: Payer: Self-pay

## 2019-12-01 ENCOUNTER — Encounter: Payer: Self-pay | Admitting: Physician Assistant

## 2019-12-01 ENCOUNTER — Ambulatory Visit (INDEPENDENT_AMBULATORY_CARE_PROVIDER_SITE_OTHER): Payer: BC Managed Care – PPO | Admitting: Physician Assistant

## 2019-12-01 VITALS — BP 158/78 | HR 84 | Temp 98.9°F | Ht 66.5 in | Wt 213.0 lb

## 2019-12-01 DIAGNOSIS — I2510 Atherosclerotic heart disease of native coronary artery without angina pectoris: Secondary | ICD-10-CM | POA: Diagnosis not present

## 2019-12-01 DIAGNOSIS — R609 Edema, unspecified: Secondary | ICD-10-CM

## 2019-12-01 DIAGNOSIS — Z8673 Personal history of transient ischemic attack (TIA), and cerebral infarction without residual deficits: Secondary | ICD-10-CM | POA: Diagnosis not present

## 2019-12-01 DIAGNOSIS — I10 Essential (primary) hypertension: Secondary | ICD-10-CM | POA: Diagnosis not present

## 2019-12-01 DIAGNOSIS — I739 Peripheral vascular disease, unspecified: Secondary | ICD-10-CM | POA: Diagnosis not present

## 2019-12-01 DIAGNOSIS — E785 Hyperlipidemia, unspecified: Secondary | ICD-10-CM | POA: Diagnosis not present

## 2019-12-01 DIAGNOSIS — I5033 Acute on chronic diastolic (congestive) heart failure: Secondary | ICD-10-CM

## 2019-12-01 DIAGNOSIS — R002 Palpitations: Secondary | ICD-10-CM | POA: Diagnosis not present

## 2019-12-01 MED ORDER — FUROSEMIDE 20 MG PO TABS: ORAL_TABLET | ORAL | 1 refills | Status: DC

## 2019-12-01 MED ORDER — METOPROLOL SUCCINATE ER 25 MG PO TB24: 25.0000 mg | ORAL_TABLET | Freq: Every day | ORAL | 1 refills | Status: DC

## 2019-12-01 NOTE — Progress Notes (Signed)
Cardiology Office Note    Date:  12/01/2019   ID:  Jill [name] Feb 28, 1966, MRN [id number]  PCP:  Iona Beard, MD  Cardiologist: Carlyle Dolly, MD EPS: None  Chief Complaint  Patient presents with  . Leg Swelling    History of Present Illness:  Jill [name] is a 54 y.o. female with history of chest pain and abnormal stress test prior to bariatric surgery in 2016 led to cardiac cath that showed mild to moderate nonobstructive disease, hypertension, HLD, orthostatic dizziness improved with changing Lasix to as needed, with history of PTA and stenting of the right common iliac and right SFA 07/2008, OSA on CPAP, carotid stenosis that is post left ICA 09/2014 on aspirin and Plavix  Dr. Harl Bowie via telemedicine 10/2018 at which time she was doing well.  Patient comes in for yearly f/u. Has had right leg swelling for about a month. Not taking prn lasix. Complains of palpitations since her 2nd covid19 vaccine. Says it just quivers or skips and she gets week and whoozy. Doesn't think it goes fast.She is getting some extra salt in her diet. Sausage biscuit, olive garden, frozen dinners.Drinks diet Sun drop daily and tea.   Past Medical History:  Diagnosis Date  . Anxiety   . Back pain    complains when laying on hard flat surface  . Chronic kidney disease    appt. /w urology 11/10/2014- for a cyst seen on [place]. evaluated was told it was nothing.  . Diabetes mellitus    Diabetes II, oral and insulin.  Marland Kitchen DVT (deep venous thrombosis) (HCC)    right leg '09 or '10  . Fibroids   . GERD (gastroesophageal reflux disease)   . Heart murmur   . History of stress test    referred for cardiac cath- done here at [facility]- 09/2014  . Hypercholesterolemia   . Hypertension   . PONV (postoperative nausea and vomiting)   . Seizures ([place])    04/2010 x1- Dr. Colan Neptune x2 yearly.  . Sleep apnea    CPAP q night , last study > 3 yrs. ago  . Stroke [facility])    '09-had blockage in brain, too deep  to operate"occ left lower extremity fatique, occ. 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 APPENDECTOMY    . CARDIAC CATHETERIZATION    . [place]   x2  . CHOLECYSTECTOMY     open"gallstones"  . DILATATION & CURETTAGE/HYSTEROSCOPY WITH MYOSURE N/A 05/10/2017   Procedure: DILATATION & CURETTAGE/HYSTEROSCOPY WITH MYOSURE;  Surgeon: Servando Salina, MD;  Location: [facility];  Service: Gynecology;  Laterality: N/A;  . INTERVENTIONAL RADIOLOGY PROCEDURE     CT angiography /neck -Dr. Estanislado Pandy.  . IR ANGIO INTRA EXTRACRAN SEL COM CAROTID INNOMINATE BILAT MOD SED  02/22/2018  . IR ANGIO VERTEBRAL SEL VERTEBRAL BILAT MOD SED  02/22/2018  . IR GENERIC HISTORICAL  08/03/2016   IR RADIOLOGIST EVAL & MGMT 08/03/2016 [facility]-INTERV RAD  . LAPAROSCOPIC GASTRIC SLEEVE RESECTION N/A 04/10/2016   Procedure: LAPAROSCOPIC GASTRIC SLEEVE RESECTION WITH UPPER ENDO;  Surgeon: Greer Pickerel, MD;  Location: [facility];  Service: General;  Laterality: N/A;  . LEFT HEART CATHETERIZATION WITH CORONARY ANGIOGRAM N/A 09/28/2014   Procedure: LEFT HEART CATHETERIZATION WITH CORONARY ANGIOGRAM;  Surgeon: Jettie Booze, MD;  Location: [facility] C[facility];  Service: Cardiovascular;  Laterality: N/A;  . RADIOLOGY WITH ANESTHESIA N/A 10/14/2014   Procedure: ANGIOPLASTY;  Surgeon: Luanne Bras, MD;  Location: [facility];  Service: Radiology;  Laterality: N/A;  . TUBAL LIGATION      Current Medications: Current Meds  Medication Sig  . acetaminophen (TYLENOL) 500 MG tablet Take 500-1,000 mg by mouth every 8 (eight) hours as needed for moderate pain or headache.   Marland Kitchen aspirin EC 81 MG tablet Take 81 mg by mouth every morning.  . calcium citrate-vitamin D 500-400 MG-UNIT chewable tablet Chew 2 tablets by mouth 2 (two) times daily.  . clopidogrel (PLAVIX) 75 MG tablet Take 75 mg by mouth  every morning.  . Cyanocobalamin (VITAMIN B-12) 5000 MCG SUBL Place 5,000 mcg under the tongue 2 (two) times a week.   . docusate sodium (COLACE) 100 MG capsule Take 100 mg by mouth daily.  . felodipine (PLENDIL) 5 MG 24 hr tablet Take 5 mg by mouth daily.   Marland Kitchen gabapentin (NEURONTIN) 300 MG capsule Take 300 mg by mouth at bedtime.   Marland Kitchen ibuprofen (ADVIL,MOTRIN) 800 MG tablet Take 1 tablet (800 mg total) by mouth every 8 (eight) hours as needed. (Patient taking differently: Take 800 mg by mouth every 8 (eight) hours as needed for headache or moderate pain. )  . lamoTRIgine (LAMICTAL) 100 MG tablet Take 100 mg by mouth every morning.   . levETIRAcetam (KEPPRA) 500 MG tablet Take 250 mg by mouth every 12 (twelve) hours.  Marland Kitchen LORazepam (ATIVAN) 1 MG tablet Take 1 mg by mouth daily as needed for anxiety.  . metFORMIN (GLUCOPHAGE) 500 MG tablet Take 500 mg by mouth 2 (two) times daily with a meal.  . Multiple Vitamin (MULITIVITAMIN WITH MINERALS) TABS Take 1 tablet by mouth daily.   Marland Kitchen olmesartan (BENICAR) 40 MG tablet Take 40 mg by mouth every morning.   . pantoprazole (PROTONIX) 40 MG tablet Take 40 mg by mouth daily.   . pioglitazone (ACTOS) 15 MG tablet Take 15 mg by mouth every morning.   . rosuvastatin (CRESTOR) 20 MG tablet Take 20 mg by mouth at bedtime.   . Semaglutide (OZEMPIC, 1 MG/DOSE, [place]) Inject into the skin.     Allergies:   Dilaudid [hydromorphone hcl], Crab [shellfish allergy], and Other   Social History   Socioeconomic History  . Marital status: Married    Spouse name: Not on file  . Number of children: Not on file  . Years of education: Not on file  . Highest education level: Not on file  Occupational History  . Not on file  Tobacco Use  . Smoking status: Current Some Day Smoker    Packs/day: 0.50    Years: 41.00    Pack years: 20.50    Types: Cigarettes    Start date: 07/25/1975  . Smokeless tobacco: Never Used  Substance and Sexual Activity  . Alcohol use: Yes     Alcohol/week: 0.0 standard drinks    Comment: rare use   . Drug use: No  . Sexual activity: Yes    Birth control/protection: Other-see comments, Surgical    Comment: Tubal Ligation  Other Topics Concern  . Not on file  Social History Narrative  . Not on file   Social Determinants of Health   Financial Resource Strain:   . Difficulty of Paying Living Expenses:   Food Insecurity:   . Worried About Charity fundraiser in the Last Year:   . [place] in the  Last Year:   Transportation Needs:   . Film/video editor (Medical):   Marland Kitchen Lack of Transportation (Non-Medical):   Physical Activity:   . Days of Exercise per Week:   . Minutes of Exercise per Session:   Stress:   . Feeling of Stress :   Social Connections:   . Frequency of Communication with Friends and Family:   . Frequency of Social Gatherings with Friends and Family:   . Attends Religious Services:   . Active Member of Clubs or Organizations:   . Attends Archivist Meetings:   Marland Kitchen Marital Status:      Family History:  The patient's   family history includes Heart attack in her father and mother; Heart disease in her father; Heart failure in her father; Kidney disease in her mother; Stroke in her paternal grandmother.   ROS:   Please see the history of present illness.    ROS All other systems reviewed and are negative.   PHYSICAL EXAM:   VS:  BP (!) 158/78   Pulse 84   Temp 98.9 F (37.2 C)   Ht 5' 6.5" (1.689 m)   Wt 213 lb (96.6 kg)   LMP 12/10/2014 Comment: pt states that there is no chance or pregnancy  SpO2 97%   BMI 33.86 kg/m   Physical Exam  GEN: Well nourished, well developed, in no acute distress  Neck: increased JVD, no carotid bruits, or masses Cardiac:RRR; S4, 2/6 systolic murmur LSB Respiratory:  clear to auscultation bilaterally, normal work of breathing GI: soft, nontender, nondistended, + BS Ext: plus 1-2 edema right, trace left otherwise without cyanosis, clubbing Good  distal pulses bilaterally Neuro:  Alert and Oriented x 3 Psych: euthymic mood, full affect  Wt Readings from Last 3 Encounters:  12/01/19 213 lb (96.6 kg)  10/25/18 216 lb (98 kg)  02/22/18 245 lb (111.1 kg)      Studies/Labs Reviewed:   EKG:  EKG is ordered today.  The ekg ordered today demonstrates NSR, no acute change  Recent Labs: No results found for requested labs within last 8760 hours.   Lipid Panel    Component Value Date/Time   CHOL 129 05/20/2011 0849   TRIG 97 05/20/2011 0849   HDL 40 05/20/2011 0849   CHOLHDL 3.2 05/20/2011 0849   VLDL 19 05/20/2011 0849   LDLCALC 70 05/20/2011 0849    Additional studies/ records that were reviewed today include:  08/2014 Lexiscan MPI IMPRESSION: 1. Large area of ischemia extending from the basal to distal anteroseptal wall.   2. Normal left ventricular wall motion.   3. Left ventricular ejection fraction 60 for%   4. High-risk stress test findings*.   09/2014 Cath HEMODYNAMICS:  Aortic pressure was 142/66; LV pressure was 144/7; LVEDP 18.  There was no gradient between the left ventricle and aorta.     ANGIOGRAPHIC DATA:   The left main coronary artery is widely patent.   The left anterior descending artery is a large vessel which wraps around the apex. There is mild atherosclerosis in the mid vessel. There is a large diagonal vessel which arises proximally. There are several small diagonals which are patent.   The left circumflex artery is a large dominant vessel. There is a ramus vessel which is large and widely patent. There is mild disease in the circumflex system. The first obtuse marginal is small but patent.  There is a small left PDA which is patent.   The right coronary artery is  a nondominant vessel. In the mid vessel, there is moderate disease.   LEFT VENTRICULOGRAM:  Left ventricular angiogram was not done due to the question of a dye allergy.  LVEDP was 18 mmHg.   IMPRESSIONS:    1. Normal left main  coronary artery. 2. Mild disease in the left anterior descending artery and its branches. 3. Mild disease in the dominant left circumflex artery and its branches. 4. Moderate disease in the nondominant right coronary artery. 5. Left ventricular systolic function not assessed.  LVEDP 18 mmHg.  6.   False positive nuclear stress test.   RECOMMENDATION:  Continue aggressive preventive therapy given her history of peripheral vascular disease and multiple risk factors for coronary atherosclerosis. She will follow-up with Dr. Harl Bowie.       ASSESSMENT:    1. Acute on chronic diastolic CHF (congestive heart failure) ([place])   2. Hyperlipidemia, unspecified hyperlipidemia type   3. Coronary artery disease involving native coronary artery of native heart without angina pectoris   4. Essential hypertension   5. Palpitations   6. PAD (peripheral artery disease) ([place])   7. Edema, unspecified type   8. History of CVA (cerebrovascular accident) without residual deficits      PLAN:  In order of problems listed above:  Acute on chronic diastolic CHF-has been getting a lot of extra salt in her diet recently with fast food, frozen and processed foods and sodas.  Has not been taking as needed Lasix in a long time.  We will add Lasix 40 mg once daily, check surveillance labs including TSH A1c and fasting lipid panel CBC and C met.  2 g sodium diet.  Update 2D echo since last one was in 2012  Palpitations since she got her second Covid vaccine about a month ago.  She says it just skips and quivers.  EKG normal today.  I have asked her to decrease her caffeine.  Add metoprolol XL 25 mg once daily for blood pressure and heart rate.  If this does not help we will add a Holter monitor.  CAD nonobstructive on cardiac cath 09/2014 after abnormal stress test/chest pain-no chest pain  Essential hypertension became orthostatic on Lasix and changed to as needed but hasn't taken lasix in a long time.  Blood pressure  running high add metoprolol  Hyperlipidemia check fasting lipid panel  PAD  History of CVA with left CEA 09/2014 on aspirin and Plavix    Medication Adjustments/Labs and Tests Ordered: Current medicines are reviewed at length with the patient today.  Concerns regarding medicines are outlined above.  Medication changes, Labs and Tests ordered today are listed in the Patient Instructions below. Patient Instructions   Medication Instructions:  Take Lasix 40 mg daily for 3 days and then reduce to 20 mg daily    Take Toprol 25 mg daily   Labwork:  Fasting lipids, TSH,A1C, CMET, cbc  Procedures/Testing: Your physician has requested that you have an echocardiogram. Echocardiography is a painless test that uses sound waves to create images of your heart. It provides your doctor with information about the size and shape of your heart and how well your heart's chambers and valves are working. This procedure takes approximately one hour. There are no restrictions for this procedure.    Follow-Up: May 24 th with Estella Husk, PA-C  Any Additional Special Instructions Will Be Listed Below (If Applicable).  Follow 2 gram sodium diet provided    Reduce caffeine intake   If you need a  refill on your cardiac medications before your next appointment, please call your pharmacy.        Two Gram Sodium Diet 2000 mg  What is Sodium? Sodium is a mineral found naturally in many foods. The most significant source of sodium in the diet is table salt, which is about 40% sodium.  Processed, convenience, and preserved foods also contain a large amount of sodium.  The body needs only 500 mg of sodium daily to function,  A normal diet provides more than enough sodium even if you do not use salt.  Why Limit Sodium? A build up of sodium in the body can cause thirst, increased blood pressure, shortness of breath, and water retention.  Decreasing sodium in the diet can reduce edema and risk of  heart attack or stroke associated with high blood pressure.  Keep in mind that there are many other factors involved in these health problems.  Heredity, obesity, lack of exercise, cigarette smoking, stress and what you eat all play a role.  General Guidelines:  Do not add salt at the table or in cooking.  One teaspoon of salt contains over 2 grams of sodium.  Read food labels  Avoid processed and convenience foods  Ask your dietitian before eating any foods not dicussed in the menu planning guidelines  Consult your physician if you wish to use a salt substitute or a sodium containing medication such as antacids.  Limit milk and milk products to 16 oz (2 cups) per day.  Shopping Hints:  READ LABELS!! "Dietetic" does not necessarily mean low sodium.  Salt and other sodium ingredients are often added to foods during processing.   Menu Planning Guidelines Food Group Choose More Often Avoid  Beverages (see also the milk group All fruit juices, low-sodium, salt-free vegetables juices, low-sodium carbonated beverages Regular vegetable or tomato juices, commercially softened water used for drinking or cooking  Breads and Cereals Enriched white, wheat, rye and pumpernickel bread, hard rolls and dinner rolls; muffins, cornbread and waffles; most dry cereals, cooked cereal without added salt; unsalted crackers and breadsticks; low sodium or homemade bread crumbs Bread, rolls and crackers with salted tops; quick breads; instant hot cereals; pancakes; commercial bread stuffing; self-rising flower and biscuit mixes; regular bread crumbs or cracker crumbs  Desserts and Sweets Desserts and sweets mad with mild should be within allowance Instant pudding mixes and cake mixes  Fats Butter or margarine; vegetable oils; unsalted salad dressings, regular salad dressings limited to 1 Tbs; light, sour and heavy cream Regular salad dressings containing bacon fat, bacon bits, and salt pork; snack dips made with  instant soup mixes or processed cheese; salted nuts  Fruits Most fresh, frozen and canned fruits Fruits processed with salt or sodium-containing ingredient (some dried fruits are processed with sodium sulfites        Vegetables Fresh, frozen vegetables and low- sodium canned vegetables Regular canned vegetables, sauerkraut, pickled vegetables, and others prepared in brine; frozen vegetables in sauces; vegetables seasoned with ham, bacon or salt pork  Condiments, Sauces, Miscellaneous  Salt substitute with physician's approval; pepper, herbs, spices; vinegar, lemon or lime juice; hot pepper sauce; garlic powder, onion powder, low sodium soy sauce (1 Tbs.); low sodium condiments (ketchup, chili sauce, mustard) in limited amounts (1 tsp.) fresh ground horseradish; unsalted tortilla chips, pretzels, potato chips, popcorn, salsa (1/4 cup) Any seasoning made with salt including garlic salt, celery salt, onion salt, and seasoned salt; sea salt, rock salt, kosher salt; meat tenderizers; monosodium glutamate; mustard,  regular soy sauce, barbecue, sauce, chili sauce, teriyaki sauce, steak sauce, Worcestershire sauce, and most flavored vinegars; canned gravy and mixes; regular condiments; salted snack foods, olives, picles, relish, horseradish sauce, catsup   Food preparation: Try these seasonings Meats:    Pork Sage, onion Serve with applesauce  Chicken Poultry seasoning, thyme, parsley Serve with cranberry sauce  Lamb Curry powder, rosemary, garlic, thyme Serve with mint sauce or jelly  Veal Marjoram, basil Serve with current jelly, cranberry sauce  Beef Pepper, bay leaf Serve with dry mustard, unsalted chive butter  Fish [place] leaf, dill Serve with unsalted lemon butter, unsalted parsley butter  Vegetables:    Asparagus Lemon juice   Broccoli Lemon juice   Carrots Mustard dressing parsley, mint, nutmeg, glazed with unsalted butter and sugar   Green beans Marjoram, lemon juice, nutmeg,dill seed     Tomatoes Basil, marjoram, onion   Spice /blend for [employer]" 4 tsp ground thyme 1 tsp ground sage 3 tsp ground rosemary 4 tsp ground marjoram   Test your knowledge 1. A product that says "Salt Free" may still contain sodium. True or False 2. Garlic Powder and Hot Pepper Sauce an be used as alternative seasonings.True or False 3. Processed foods have more sodium than fresh foods.  True or False 4. Canned Vegetables have less sodium than froze True or False  WAYS TO DECREASE YOUR SODIUM INTAKE 1. Avoid the use of added salt in cooking and at the table.  Table salt (and other prepared seasonings which contain salt) is probably one of the greatest sources of sodium in the diet.  Unsalted foods can gain flavor from the sweet, sour, and butter taste sensations of herbs and spices.  Instead of using salt for seasoning, try the following seasonings with the foods listed.  Remember: how you use them to enhance natural food flavors is limited only by your creativity... Allspice-Meat, fish, eggs, fruit, peas, red and yellow vegetables Almond Extract-Fruit baked goods Anise Seed-Sweet breads, fruit, carrots, beets, cottage cheese, cookies (tastes like licorice) Basil-Meat, fish, eggs, vegetables, rice, vegetables salads, soups, sauces Bay Leaf-Meat, fish, stews, poultry Burnet-Salad, vegetables (cucumber-like flavor) Caraway Seed-Bread, cookies, cottage cheese, meat, vegetables, cheese, rice Cardamon-Baked goods, fruit, soups Celery Powder or seed-Salads, salad dressings, sauces, meatloaf, soup, bread.Do not use  celery salt Chervil-Meats, salads, fish, eggs, vegetables, cottage cheese (parsley-like flavor) Chili Power-Meatloaf, chicken cheese, corn, eggplant, egg dishes Chives-Salads cottage cheese, egg dishes, soups, vegetables, sauces Cilantro-Salsa, casseroles Cinnamon-Baked goods, fruit, pork, lamb, chicken, carrots Cloves-Fruit, baked goods, fish, pot roast, green beans, beets,  carrots Coriander-Pastry, cookies, meat, salads, cheese (lemon-orange flavor) Cumin-Meatloaf, fish,cheese, eggs, cabbage,fruit pie (caraway flavor) [employer], fruit, eggs, fish, poultry, cottage cheese, vegetables Dill Seed-Meat, cottage cheese, poultry, vegetables, fish, salads, bread Fennel Seed-Bread, cookies, apples, pork, eggs, fish, beets, cabbage, cheese, Licorice-like flavor Garlic-(buds or powder) Salads, meat, poultry, fish, bread, butter, vegetables, potatoes.Do not  use garlic salt Ginger-Fruit, vegetables, baked goods, meat, fish, poultry Horseradish Root-Meet, vegetables, butter Lemon Juice or Extract-Vegetables, fruit, tea, baked goods, fish salads Mace-Baked goods fruit, vegetables, fish, poultry (taste like nutmeg) Maple Extract-Syrups Marjoram-Meat, chicken, fish, vegetables, breads, green salads (taste like Sage) Mint-Tea, lamb, sherbet, vegetables, desserts, carrots, cabbage Mustard, Dry or Seed-Cheese, eggs, meats, vegetables, poultry Nutmeg-Baked goods, fruit, chicken, eggs, vegetables, desserts Onion Powder-Meat, fish, poultry, vegetables, cheese, eggs, bread, rice salads (Do not use   Onion salt) Orange Extract-Desserts, baked goods Oregano-Pasta, eggs, cheese, onions, pork, lamb, fish, chicken, vegetables, green salads Paprika-Meat, fish, poultry, eggs,  cheese, vegetables Parsley Flakes-Butter, vegetables, meat fish, poultry, eggs, bread, salads (certain forms may   Contain sodium Pepper-Meat fish, poultry, vegetables, eggs Peppermint Extract-Desserts, baked goods Poppy Seed-Eggs, bread, cheese, fruit dressings, baked goods, noodles, vegetables, cottage  [employer], poultry, meat, fish, cauliflower, turnips,eggs bread Saffron-Rice, bread, veal, chicken, fish, eggs Sage-Meat, fish, poultry, onions, eggplant, tomateos, pork, stews Savory-Eggs, salads, poultry, meat, rice, vegetables, soups, pork Tarragon-Meat,  poultry, fish, eggs, butter, vegetables (licorice-like flavor)  Thyme-Meat, poultry, fish, eggs, vegetables, (clover-like flavor), sauces, soups Tumeric-Salads, butter, eggs, fish, rice, vegetables (saffron-like flavor) Vanilla Extract-Baked goods, candy Vinegar-Salads, vegetables, meat marinades Walnut Extract-baked goods, candy  2. Choose your Foods Wisely   The following is a list of foods to avoid which are high in sodium:  Meats-Avoid all smoked, canned, salt cured, dried and kosher meat and fish as well as Anchovies   [employer] meats:Bologna, Liverwurst, Pastrami Canned meat or fish  Marinated herring Caviar    Pepperoni Corned Beef   Pizza Dried chipped beef  Salami Frozen breaded fish or meat Salt pork Frankfurters or hot dogs  Sardines Gefilte fish   Sausage Ham (boiled ham, Proscuitto Smoked butt    spiced ham)   Spam      TV Dinners Vegetables Canned vegetables (Regular) Relish Canned mushrooms  Sauerkraut Olives    Tomato juice Pickles  Bakery and Dessert Products Canned puddings  Cream pies Cheesecake   Decorated cakes Cookies  Beverages/Juices Tomato juice, regular  Gatorade   V-8 vegetable juice, regular  Breads and Cereals Biscuit mixes   Salted potato chips, corn chips, pretzels Bread stuffing mixes  Salted crackers and rolls Pancake and waffle mixes Self-rising flour  Seasonings Accent    Meat sauces Barbecue sauce  Meat tenderizer Catsup    Monosodium glutamate (MSG) Celery salt   Onion salt Chili sauce   Prepared mustard Garlic salt   Salt, seasoned salt, sea salt Gravy mixes   Soy sauce Horseradish   Steak sauce Ketchup   Tartar sauce Lite salt    Teriyaki sauce Marinade mixes   Worcestershire sauce  Others Baking powder   Cocoa and cocoa mixes Baking soda   Commercial casserole mixes Candy-caramels, chocolate  Dehydrated soups    Bars, fudge,nougats  Instant rice and pasta mixes Canned broth or soup  Maraschino  cherries Cheese, aged and processed cheese and cheese spreads  Learning Assessment Quiz  Indicated T (for True) or F (for False) for each of the following statements:  1. _____ Fresh fruits and vegetables and unprocessed grains are generally low in sodium 2. _____ Water may contain a considerable amount of sodium, depending on the source 3. _____ You can always tell if a food is high in sodium by tasting it 4. _____ Certain laxatives my be high in sodium and should be avoided unless prescribed   by a physician or pharmacist 5. _____ Salt substitutes may be used freely by anyone on a sodium restricted diet 6. _____ Sodium is present in table salt, food additives and as a natural component of   most foods 7. _____ Table salt is approximately 90% sodium 8. _____ Limiting sodium intake may help prevent excess fluid accumulation in the body 9. _____ On a sodium-restricted diet, seasonings such as bouillon soy sauce, and    cooking wine should be used in place of table salt 10. _____ On an ingredient list, a product which lists monosodium glutamate as the first   ingredient  is an appropriate food to include on a low sodium diet  Circle the best answer(s) to the following statements (Hint: there may be more than one correct answer)  11. On a low-sodium diet, some acceptable snack items are:    A. Olives  F. Bean dip   K. Grapefruit juice    B. Salted Pretzels G. Commercial Popcorn   L. Canned peaches    C. Carrot Sticks  H. Bouillon   M. Unsalted nuts   D. [place] fries  I. Peanut butter crackers N. Salami   E. Sweet pickles J. Tomato Juice   O. Pizza  12.  Seasonings that may be used freely on a reduced - sodium diet include   A. Lemon wedges F.Monosodium glutamate K. Celery seed    B.Soysauce   G. Pepper   L. Mustard powder   C. Sea salt  H. Cooking wine  M. Onion flakes   D. Vinegar  E. Prepared horseradish N. Salsa   E. Sage   J. Worcestershire sauce  O. [address]       Sumner Boast, PA-C  12/01/2019 2:00 PM    [facility] [place], [place], [place]  [address] Phone: [phone]; Fax: [phone]

## 2019-12-01 NOTE — Patient Instructions (Addendum)
Medication Instructions:  Take Lasix 40 mg daily for 3 days and then reduce to 20 mg daily    Take Toprol 25 mg daily   Labwork:  Fasting lipids, TSH,A1C, CMET, cbc  Procedures/Testing: Your physician has requested that you have an echocardiogram. Echocardiography is a painless test that uses sound waves to create images of your heart. It provides your doctor with information about the size and shape of your heart and how well your heart's chambers and valves are working. This procedure takes approximately one hour. There are no restrictions for this procedure.    Follow-Up: May 24 th with Estella Husk, PA-C  Any Additional Special Instructions Will Be Listed Below (If Applicable).  Follow 2 gram sodium diet provided    Reduce caffeine intake   If you need a refill on your cardiac medications before your next appointment, please call your pharmacy.        Two Gram Sodium Diet 2000 mg  What is Sodium? Sodium is a mineral found naturally in many foods. The most significant source of sodium in the diet is table salt, which is about 40% sodium.  Processed, convenience, and preserved foods also contain a large amount of sodium.  The body needs only 500 mg of sodium daily to function,  A normal diet provides more than enough sodium even if you do not use salt.  Why Limit Sodium? A build up of sodium in the body can cause thirst, increased blood pressure, shortness of breath, and water retention.  Decreasing sodium in the diet can reduce edema and risk of heart attack or stroke associated with high blood pressure.  Keep in mind that there are many other factors involved in these health problems.  Heredity, obesity, lack of exercise, cigarette smoking, stress and what you eat all play a role.  General Guidelines:  Do not add salt at the table or in cooking.  One teaspoon of salt contains over 2 grams of sodium.  Read food labels  Avoid processed and convenience foods  Ask  your dietitian before eating any foods not dicussed in the menu planning guidelines  Consult your physician if you wish to use a salt substitute or a sodium containing medication such as antacids.  Limit milk and milk products to 16 oz (2 cups) per day.  Shopping Hints:  READ LABELS!! "Dietetic" does not necessarily mean low sodium.  Salt and other sodium ingredients are often added to foods during processing.   Menu Planning Guidelines Food Group Choose More Often Avoid  Beverages (see also the milk group All fruit juices, low-sodium, salt-free vegetables juices, low-sodium carbonated beverages Regular vegetable or tomato juices, commercially softened water used for drinking or cooking  Breads and Cereals Enriched white, wheat, rye and pumpernickel bread, hard rolls and dinner rolls; muffins, cornbread and waffles; most dry cereals, cooked cereal without added salt; unsalted crackers and breadsticks; low sodium or homemade bread crumbs Bread, rolls and crackers with salted tops; quick breads; instant hot cereals; pancakes; commercial bread stuffing; self-rising flower and biscuit mixes; regular bread crumbs or cracker crumbs  Desserts and Sweets Desserts and sweets mad with mild should be within allowance Instant pudding mixes and cake mixes  Fats Butter or margarine; vegetable oils; unsalted salad dressings, regular salad dressings limited to 1 Tbs; light, sour and heavy cream Regular salad dressings containing bacon fat, bacon bits, and salt pork; snack dips made with instant soup mixes or processed cheese; salted nuts  Fruits Most fresh, frozen and  canned fruits Fruits processed with salt or sodium-containing ingredient (some dried fruits are processed with sodium sulfites        Vegetables Fresh, frozen vegetables and low- sodium canned vegetables Regular canned vegetables, sauerkraut, pickled vegetables, and others prepared in brine; frozen vegetables in sauces; vegetables seasoned with  ham, bacon or salt pork  Condiments, Sauces, Miscellaneous  Salt substitute with physician's approval; pepper, herbs, spices; vinegar, lemon or lime juice; hot pepper sauce; garlic powder, onion powder, low sodium soy sauce (1 Tbs.); low sodium condiments (ketchup, chili sauce, mustard) in limited amounts (1 tsp.) fresh ground horseradish; unsalted tortilla chips, pretzels, potato chips, popcorn, salsa (1/4 cup) Any seasoning made with salt including garlic salt, celery salt, onion salt, and seasoned salt; sea salt, rock salt, kosher salt; meat tenderizers; monosodium glutamate; mustard, regular soy sauce, barbecue, sauce, chili sauce, teriyaki sauce, steak sauce, Worcestershire sauce, and most flavored vinegars; canned gravy and mixes; regular condiments; salted snack foods, olives, picles, relish, horseradish sauce, catsup   Food preparation: Try these seasonings Meats:    Pork Sage, onion Serve with applesauce  Chicken Poultry seasoning, thyme, parsley Serve with cranberry sauce  Lamb Curry powder, rosemary, garlic, thyme Serve with mint sauce or jelly  Veal Marjoram, basil Serve with current jelly, cranberry sauce  Beef Pepper, bay leaf Serve with dry mustard, unsalted chive butter  Fish [place] leaf, dill Serve with unsalted lemon butter, unsalted parsley butter  Vegetables:    Asparagus Lemon juice   Broccoli Lemon juice   Carrots Mustard dressing parsley, mint, nutmeg, glazed with unsalted butter and sugar   Green beans Marjoram, lemon juice, nutmeg,dill seed   Tomatoes Basil, marjoram, onion   Spice /blend for [employer]" 4 tsp ground thyme 1 tsp ground sage 3 tsp ground rosemary 4 tsp ground marjoram   Test your knowledge 1. A product that says "Salt Free" may still contain sodium. True or False 2. Garlic Powder and Hot Pepper Sauce an be used as alternative seasonings.True or False 3. Processed foods have more sodium than fresh foods.  True or False 4. Canned Vegetables have less  sodium than froze True or False  WAYS TO DECREASE YOUR SODIUM INTAKE 1. Avoid the use of added salt in cooking and at the table.  Table salt (and other prepared seasonings which contain salt) is probably one of the greatest sources of sodium in the diet.  Unsalted foods can gain flavor from the sweet, sour, and butter taste sensations of herbs and spices.  Instead of using salt for seasoning, try the following seasonings with the foods listed.  Remember: how you use them to enhance natural food flavors is limited only by your creativity... Allspice-Meat, fish, eggs, fruit, peas, red and yellow vegetables Almond Extract-Fruit baked goods Anise Seed-Sweet breads, fruit, carrots, beets, cottage cheese, cookies (tastes like licorice) Basil-Meat, fish, eggs, vegetables, rice, vegetables salads, soups, sauces Bay Leaf-Meat, fish, stews, poultry Burnet-Salad, vegetables (cucumber-like flavor) Caraway Seed-Bread, cookies, cottage cheese, meat, vegetables, cheese, rice Cardamon-Baked goods, fruit, soups Celery Powder or seed-Salads, salad dressings, sauces, meatloaf, soup, bread.Do not use  celery salt Chervil-Meats, salads, fish, eggs, vegetables, cottage cheese (parsley-like flavor) Chili Power-Meatloaf, chicken cheese, corn, eggplant, egg dishes Chives-Salads cottage cheese, egg dishes, soups, vegetables, sauces Cilantro-Salsa, casseroles Cinnamon-Baked goods, fruit, pork, lamb, chicken, carrots Cloves-Fruit, baked goods, fish, pot roast, green beans, beets, carrots Coriander-Pastry, cookies, meat, salads, cheese (lemon-orange flavor) Cumin-Meatloaf, fish,cheese, eggs, cabbage,fruit pie (caraway flavor) Curry Powder-Meat, fruit, eggs, fish, poultry, cottage cheese, vegetables  Dill Seed-Meat, cottage cheese, poultry, vegetables, fish, salads, bread Fennel Seed-Bread, cookies, apples, pork, eggs, fish, beets, cabbage, cheese, Licorice-like flavor Garlic-(buds or powder) Salads, meat, poultry, fish,  bread, butter, vegetables, potatoes.Do not  use garlic salt Ginger-Fruit, vegetables, baked goods, meat, fish, poultry Horseradish Root-Meet, vegetables, butter Lemon Juice or Extract-Vegetables, fruit, tea, baked goods, fish salads Mace-Baked goods fruit, vegetables, fish, poultry (taste like nutmeg) Maple Extract-Syrups Marjoram-Meat, chicken, fish, vegetables, breads, green salads (taste like Sage) Mint-Tea, lamb, sherbet, vegetables, desserts, carrots, cabbage Mustard, Dry or Seed-Cheese, eggs, meats, vegetables, poultry Nutmeg-Baked goods, fruit, chicken, eggs, vegetables, desserts Onion Powder-Meat, fish, poultry, vegetables, cheese, eggs, bread, rice salads (Do not use   Onion salt) Orange Extract-Desserts, baked goods Oregano-Pasta, eggs, cheese, onions, pork, lamb, fish, chicken, vegetables, green salads Paprika-Meat, fish, poultry, eggs, cheese, vegetables Parsley Flakes-Butter, vegetables, meat fish, poultry, eggs, bread, salads (certain forms may   Contain sodium Pepper-Meat fish, poultry, vegetables, eggs Peppermint Extract-Desserts, baked goods Poppy Seed-Eggs, bread, cheese, fruit dressings, baked goods, noodles, vegetables, cottage  [employer], poultry, meat, fish, cauliflower, turnips,eggs bread Saffron-Rice, bread, veal, chicken, fish, eggs Sage-Meat, fish, poultry, onions, eggplant, tomateos, pork, stews Savory-Eggs, salads, poultry, meat, rice, vegetables, soups, pork Tarragon-Meat, poultry, fish, eggs, butter, vegetables (licorice-like flavor)  Thyme-Meat, poultry, fish, eggs, vegetables, (clover-like flavor), sauces, soups Tumeric-Salads, butter, eggs, fish, rice, vegetables (saffron-like flavor) Vanilla Extract-Baked goods, candy Vinegar-Salads, vegetables, meat marinades Walnut Extract-baked goods, candy  2. Choose your Foods Wisely   The following is a list of foods to avoid which are high in sodium:  Meats-Avoid  all smoked, canned, salt cured, dried and kosher meat and fish as well as Anchovies   [employer] meats:Bologna, Liverwurst, Pastrami Canned meat or fish  Marinated herring Caviar    Pepperoni Corned Beef   Pizza Dried chipped beef  Salami Frozen breaded fish or meat Salt pork Frankfurters or hot dogs  Sardines Gefilte fish   Sausage Ham (boiled ham, Proscuitto Smoked butt    spiced ham)   Spam      TV Dinners Vegetables Canned vegetables (Regular) Relish Canned mushrooms  Sauerkraut Olives    Tomato juice Pickles  Bakery and Dessert Products Canned puddings  Cream pies Cheesecake   Decorated cakes Cookies  Beverages/Juices Tomato juice, regular  Gatorade   V-8 vegetable juice, regular  Breads and Cereals Biscuit mixes   Salted potato chips, corn chips, pretzels Bread stuffing mixes  Salted crackers and rolls Pancake and waffle mixes Self-rising flour  Seasonings Accent    Meat sauces Barbecue sauce  Meat tenderizer Catsup    Monosodium glutamate (MSG) Celery salt   Onion salt Chili sauce   Prepared mustard Garlic salt   Salt, seasoned salt, sea salt Gravy mixes   Soy sauce Horseradish   Steak sauce Ketchup   Tartar sauce Lite salt    Teriyaki sauce Marinade mixes   Worcestershire sauce  Others Baking powder   Cocoa and cocoa mixes Baking soda   Commercial casserole mixes Candy-caramels, chocolate  Dehydrated soups    Bars, fudge,nougats  Instant rice and pasta mixes Canned broth or soup  Maraschino cherries Cheese, aged and processed cheese and cheese spreads  Learning Assessment Quiz  Indicated T (for True) or F (for False) for each of the following statements:  1. _____ Fresh fruits and vegetables and unprocessed grains are generally low in sodium 2. _____ Water may contain a considerable amount of sodium, depending on the source 3. _____  You can always tell if a food is high in sodium by tasting it 4. _____ Certain laxatives my be high in  sodium and should be avoided unless prescribed   by a physician or pharmacist 5. _____ Salt substitutes may be used freely by anyone on a sodium restricted diet 6. _____ Sodium is present in table salt, food additives and as a natural component of   most foods 7. _____ Table salt is approximately 90% sodium 8. _____ Limiting sodium intake may help prevent excess fluid accumulation in the body 9. _____ On a sodium-restricted diet, seasonings such as bouillon soy sauce, and    cooking wine should be used in place of table salt 10. _____ On an ingredient list, a product which lists monosodium glutamate as the first   ingredient is an appropriate food to include on a low sodium diet  Circle the best answer(s) to the following statements (Hint: there may be more than one correct answer)  11. On a low-sodium diet, some acceptable snack items are:    A. Olives  F. Bean dip   K. Grapefruit juice    B. Salted Pretzels G. Commercial Popcorn   L. Canned peaches    C. Carrot Sticks  H. Bouillon   M. Unsalted nuts   D. [place] fries  I. Peanut butter crackers N. Salami   E. Sweet pickles J. Tomato Juice   O. Pizza  12.  Seasonings that may be used freely on a reduced - sodium diet include   A. Lemon wedges F.Monosodium glutamate K. Celery seed    B.Soysauce   G. Pepper   L. Mustard powder   C. Sea salt  H. Cooking wine  M. Onion flakes   D. Vinegar  E. Prepared horseradish N. Salsa   E. Sage   J. Worcestershire sauce  O. [place]

## 2019-12-08 NOTE — Progress Notes (Signed)
Cardiology Office Note    Date:  12/15/2019   ID:  Jill, [name] 1966/05/21, MRN [id number]  PCP:  Iona Beard, MD  Cardiologist: Carlyle Dolly, MD EPS: None  Chief Complaint  Patient presents with   Leg Swelling    History of Present Illness:  Jill [name] is a 54 y.o. female with history of chest pain and abnormal stress test prior to bariatric surgery in 2016 led to cardiac cath that showed mild to moderate nonobstructive disease, hypertension, HLD, orthostatic dizziness improved with changing Lasix to as needed, with history of PTA and stenting of the right common iliac and right SFA 07/2008, OSA on CPAP, carotid stenosis that is post left ICA 09/2014 on aspirin and Plavix   Dr. Harl Bowie via telemedicine 10/2018 at which time she was doing well.  I saw the patient 12/01/2019 and was having right leg swelling for about a month but not taking her as needed Lasix because of history of orthostasis on it in the past..  Was getting extra salt in her diet.  Also complained of palpitations since her second Covid vaccine.  Added Lasix 40 mg once daily and metoprolol XL 25 mg daily.  Echo and labs scheduled 12/09/2019.  Echo showed normal LV function EF 60 to 65% with grade 1 DD.  Potassium was low at 3.0 creatinine 0.54 LDL 81 triglycerides 178 A1C-7.1.  I increased her Crestor to 40 mg daily and replaced her potassium  Patient comes in for f/u. Was on vacation last week and ate out a lot.  Was also on her feet walking around a lot.  Her legs did swell. She never listened to her messages so never picked up her potassium or increased her Crestor.  No further palpitations since metoprolol added.   Past Medical History:  Diagnosis Date   Anxiety    Back pain    complains when laying on hard flat surface   Chronic kidney disease    appt. /w urology 11/10/2014- for a cyst seen on [place]. evaluated was told it was nothing.   Diabetes mellitus    Diabetes II, oral and insulin.   DVT  (deep venous thrombosis) (HCC)    right leg '09 or '10   Fibroids    GERD (gastroesophageal reflux disease)    Heart murmur    History of stress test    referred for cardiac cath- done here at [facility]- 09/2014   Hypercholesterolemia    Hypertension    PONV (postoperative nausea and vomiting)    Seizures ([place])    04/2010 x1- Dr. Colan Neptune x2 yearly.   Sleep apnea    CPAP q night , last study > 3 yrs. ago   Stroke [facility])    '09-had blockage in brain, too deep to operate"occ left lower extremity fatique, occ. 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    Past Surgical History:  Procedure Laterality Date   [place]   x2   CHOLECYSTECTOMY     open"gallstones"   DILATATION & CURETTAGE/HYSTEROSCOPY WITH MYOSURE N/A 05/10/2017   Procedure: [place];  Surgeon: Servando Salina, MD;  Location: [place] [facility];  Service: Gynecology;  Laterality: N/A;   INTERVENTIONAL RADIOLOGY PROCEDURE     CT angiography /neck -Dr. Estanislado Pandy.   IR ANGIO INTRA EXTRACRAN SEL  COM CAROTID INNOMINATE BILAT MOD SED  02/22/2018   IR ANGIO VERTEBRAL SEL VERTEBRAL BILAT MOD SED  02/22/2018   IR GENERIC HISTORICAL  08/03/2016   IR RADIOLOGIST EVAL & MGMT 08/03/2016 [facility]-INTERV RAD   LAPAROSCOPIC GASTRIC SLEEVE RESECTION N/A 04/10/2016   Procedure: LAPAROSCOPIC GASTRIC SLEEVE RESECTION WITH UPPER ENDO;  Surgeon: Greer Pickerel, MD;  Location: [facility];  Service: General;  Laterality: N/A;   LEFT HEART CATHETERIZATION WITH CORONARY ANGIOGRAM N/A 09/28/2014   Procedure: LEFT HEART CATHETERIZATION WITH CORONARY ANGIOGRAM;  Surgeon: Jettie Booze, MD;  Location: [facility] CATH LAB;  Service: Cardiovascular;  Laterality: N/A;   RADIOLOGY WITH ANESTHESIA N/A 10/14/2014   Procedure: ANGIOPLASTY;  Surgeon: Luanne Bras, MD;   Location: [place];  Service: Radiology;  Laterality: N/A;   TUBAL LIGATION      Current Medications: Current Meds  Medication Sig   acetaminophen (TYLENOL) 500 MG tablet Take 500-1,000 mg by mouth every 8 (eight) hours as needed for moderate pain or headache.    aspirin EC 81 MG tablet Take 81 mg by mouth every morning.   calcium citrate-vitamin D 500-400 MG-UNIT chewable tablet Chew 2 tablets by mouth 2 (two) times daily.   clopidogrel (PLAVIX) 75 MG tablet Take 75 mg by mouth every morning.   Cyanocobalamin (VITAMIN B-12) 5000 MCG SUBL Place 5,000 mcg under the tongue 2 (two) times a week.    docusate sodium (COLACE) 100 MG capsule Take 100 mg by mouth daily.   felodipine (PLENDIL) 5 MG 24 hr tablet Take 5 mg by mouth daily.    furosemide (LASIX) 20 MG tablet Take 40 mg for 3 days and then decrease to 20 mg daily   gabapentin (NEURONTIN) 300 MG capsule Take 300 mg by mouth at bedtime.    ibuprofen (ADVIL,MOTRIN) 800 MG tablet Take 1 tablet (800 mg total) by mouth every 8 (eight) hours as needed.   lamoTRIgine (LAMICTAL) 100 MG tablet Take 100 mg by mouth every morning.    levETIRAcetam (KEPPRA) 500 MG tablet Take 250 mg by mouth every 12 (twelve) hours.   LORazepam (ATIVAN) 1 MG tablet Take 1 mg by mouth daily as needed for anxiety.   metFORMIN (GLUCOPHAGE) 500 MG tablet Take 500 mg by mouth 2 (two) times daily with a meal.   metoprolol succinate (TOPROL XL) 25 MG 24 hr tablet Take 1 tablet (25 mg total) by mouth daily.   Multiple Vitamin (MULITIVITAMIN WITH MINERALS) TABS Take 1 tablet by mouth daily.    olmesartan (BENICAR) 40 MG tablet Take 40 mg by mouth every morning.    pantoprazole (PROTONIX) 40 MG tablet Take 40 mg by mouth daily.    pioglitazone (ACTOS) 15 MG tablet Take 15 mg by mouth every morning.    rosuvastatin (CRESTOR) 40 MG tablet Take 1 tablet (40 mg total) by mouth at bedtime.   [DISCONTINUED] rosuvastatin (CRESTOR) 20 MG tablet Take 20 mg by mouth  at bedtime.      Allergies:   Dilaudid [hydromorphone hcl], Crab [shellfish allergy], and Other   Social History   Socioeconomic History   Marital status: Married    Spouse name: Not on file   Number of children: Not on file   Years of education: Not on file   Highest education level: Not on file  Occupational History   Not on file  Tobacco Use   Smoking status: Current Some Day Smoker    Packs/day: 0.50    Years: 41.00    Pack years: 20.50  Types: Cigarettes    Start date: 07/25/1975   Smokeless tobacco: Never Used  Substance and Sexual Activity   Alcohol use: Yes    Alcohol/week: 0.0 standard drinks    Comment: rare use    Drug use: No   Sexual activity: Yes    Birth control/protection: Other-see comments, Surgical    Comment: Tubal Ligation  Other Topics Concern   Not on file  Social History Narrative   Not on file   Social Determinants of Health   Financial Resource Strain:    Difficulty of Paying Living Expenses:   Food Insecurity:    Worried About Charity fundraiser in the Last Year:    Arboriculturist in the Last Year:   Transportation Needs:    Film/video editor (Medical):    Lack of Transportation (Non-Medical):   Physical Activity:    Days of Exercise per Week:    Minutes of Exercise per Session:   Stress:    Feeling of Stress :   Social Connections:    Frequency of Communication with Friends and Family:    Frequency of Social Gatherings with Friends and Family:    Attends Religious Services:    Active Member of Clubs or Organizations:    Attends Music therapist:    Marital Status:      Family History:  The patient's   family history includes Heart attack in her father and mother; Heart disease in her father; Heart failure in her father; Kidney disease in her mother; Stroke in her paternal grandmother.   ROS:   Please see the history of present illness.    ROS All other systems reviewed and are  negative.   PHYSICAL EXAM:   VS:  BP 132/74    Pulse 68    Ht 5' 6.5" (1.689 m)    Wt 211 lb 9.6 oz (96 kg)    LMP 12/10/2014 Comment: pt states that there is no chance or pregnancy   SpO2 96%    BMI 33.64 kg/m   Physical Exam  GEN: Obese, in no acute distress  Neck: no JVD, carotid bruits, or masses Cardiac:RRR; no murmurs, rubs, or gallops  Respiratory:  clear to auscultation bilaterally, normal work of breathing GI: soft, nontender, nondistended, + BS Ext: without cyanosis, clubbing, or edema, Good distal pulses bilaterally Neuro:  Alert and Oriented x 3, Psych: euthymic mood, full affect  Wt Readings from Last 3 Encounters:  12/15/19 211 lb 9.6 oz (96 kg)  12/01/19 213 lb (96.6 kg)  10/25/18 216 lb (98 kg)      Studies/Labs Reviewed:   EKG:  EKG is not ordered today.  Recent Labs: 12/09/2019: ALT 12; BUN 12; Creatinine, Ser 0.54; Hemoglobin 14.0; Platelets 207; Potassium 3.0; Sodium 143; TSH 1.035   Lipid Panel    Component Value Date/Time   CHOL 166 12/09/2019 0933   TRIG 178 (H) 12/09/2019 0933   HDL 49 12/09/2019 0933   CHOLHDL 3.4 12/09/2019 0933   VLDL 36 12/09/2019 0933   LDLCALC 81 12/09/2019 0933    Additional studies/ records that were reviewed today include:   2Decho 12/09/19 IMPRESSIONS     1. Left ventricular ejection fraction, by estimation, is 60 to 65%. The  left ventricle has normal function. The left ventricle has no regional  wall motion abnormalities. There is moderate left ventricular hypertrophy.  Left ventricular diastolic  parameters are consistent with Grade I diastolic dysfunction (impaired  relaxation).   2.  Right ventricular systolic function is normal. The right ventricular  size is normal.   3. The mitral valve is normal in structure. No evidence of mitral valve  regurgitation. No evidence of mitral stenosis.   4. The aortic valve is tricuspid. Aortic valve regurgitation is not  visualized. No aortic stenosis is present.   5.  The inferior vena cava is dilated in size with >50% respiratory  variability, suggesting right atrial pressure of 8 mmHg.   FINDINGS   Left Ventricle: Left ventricular ejection fraction, by estimation, is 60  to 65%. The left ventricle has normal function. The left ventricle has no  regional wall motion abnormalities. The left ventricular internal cavity  size was normal in size. There is   moderate left ventricular hypertrophy. Left ventricular diastolic  parameters are consistent with Grade I diastolic dysfunction (impaired  relaxation). Normal left ventricular filling pressure.   Right Ventricle: The right ventricular size is normal. No increase in  right ventricular wall thickness. Right ventricular systolic function is  normal.   Left Atrium: Left atrial size was normal in size.   Right Atrium: Right atrial size was normal in size.   Pericardium: There is no evidence of pericardial effusion.   Mitral Valve: The mitral valve is normal in structure. No evidence of  mitral valve regurgitation. No evidence of mitral valve stenosis.   Tricuspid Valve: The tricuspid valve is normal in structure. Tricuspid  valve regurgitation is trivial. No evidence of tricuspid stenosis.   Aortic Valve: The aortic valve is tricuspid. Aortic valve regurgitation is  not visualized. No aortic stenosis is present. Aortic valve mean gradient  measures 4.2 mmHg. Aortic valve peak gradient measures 8.5 mmHg. Aortic  valve area, by VTI measures 2.70  cm.   Pulmonic Valve: The pulmonic valve was not well visualized. Pulmonic valve  regurgitation is not visualized. No evidence of pulmonic stenosis.   Aorta: The aortic root is normal in size and structure.   Pulmonary Artery: Indeterminant PASP, inadequate TR jet.   Venous: The inferior vena cava is dilated in size with greater than 50%  respiratory variability, suggesting right atrial pressure of 8 mmHg.   IAS/Shunts: No atrial level shunt detected  by color flow Doppler.      08/2014 Lexiscan MPI IMPRESSION: 1. Large area of ischemia extending from the basal to distal anteroseptal wall.   2. Normal left ventricular wall motion.   3. Left ventricular ejection fraction 60 for%   4. High-risk stress test findings*.   09/2014 Cath HEMODYNAMICS:  Aortic pressure was 142/66; LV pressure was 144/7; LVEDP 18.  There was no gradient between the left ventricle and aorta.     ANGIOGRAPHIC DATA:   The left main coronary artery is widely patent.   The left anterior descending artery is a large vessel which wraps around the apex. There is mild atherosclerosis in the mid vessel. There is a large diagonal vessel which arises proximally. There are several small diagonals which are patent.   The left circumflex artery is a large dominant vessel. There is a ramus vessel which is large and widely patent. There is mild disease in the circumflex system. The first obtuse marginal is small but patent.  There is a small left PDA which is patent.   The right coronary artery is a nondominant vessel. In the mid vessel, there is moderate disease.   LEFT VENTRICULOGRAM:  Left ventricular angiogram was not done due to the question of a dye allergy.  LVEDP  was 18 mmHg.   IMPRESSIONS:    1. Normal left main coronary artery. 2. Mild disease in the left anterior descending artery and its branches. 3. Mild disease in the dominant left circumflex artery and its branches. 4. Moderate disease in the nondominant right coronary artery. 5. Left ventricular systolic function not assessed.  LVEDP 18 mmHg.  6.   False positive nuclear stress test.   RECOMMENDATION:  Continue aggressive preventive therapy given her history of peripheral vascular disease and multiple risk factors for coronary atherosclerosis. She will follow-up with Dr. Harl Bowie.         ASSESSMENT:    1. Chronic diastolic CHF (congestive heart failure) (HCC)   2. Palpitations   3. 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 Essential hypertension   5. PAD (peripheral artery disease) ([place])   6. History of CVA (cerebrovascular accident) without residual deficits   7. Diabetes mellitus type 2 in obese [facility])      PLAN:  In order of problems listed above:  Chronic diastolic CHF.  Patient was getting extra salt in her diet and not taking Lasix.  I  started Lasix 40 mg once daily-2Decho 12/09/19: Normal LVEF with grade 1 DD. K was low but patient never checked her messages or picked up the potassium in the pharmacy.  Will replace today and follow-up be met next week.   Palpitations since her second Covid vaccine.  I added metoprolol XL 25 mg once daily for heart rate and blood pressure and asked her to decrease caffeine intake.  She has had no further palpitations and blood pressure is better controlled  CAD nonobstructive on cardiac cath 09/2014 after abnormal stress test  Hyperlipidemia LDL 81 triglycerides 178 increase Crestor to 40 mg daily and repeat fasting lipid panel in 3 months  PAD-History of CVA with left CEA 2016 on aspirin and Plavix  Diabetes mellitus with elevated A1c of 7.1.  Follow-up with PCP.  Medication Adjustments/Labs and Tests Ordered: Current medicines are reviewed at length with the patient today.  Concerns regarding medicines are outlined above.  Medication changes, Labs and Tests ordered today are listed in the Patient Instructions below. Patient Instructions  Medication Instructions:   INCREASE Rosuvastatin (Crestor) to 40 mg daily.  START Potassium 20 mEq---take 2 tablets today and 2 tablets tomorrow (5/25); then take 1 tablet daily with your Lasix dose.  *If you need a refill on your cardiac medications before your next appointment, please call your pharmacy*   Lab Work: Your physician recommends that you return for lab work next week: BMET  Your physician recommends that you return for  a FASTING lipid profile in 3 months (end of August).   If you have labs (blood work) drawn today and your tests are completely normal, you will receive your results only by:  [place] (if you have MyChart) OR  A paper copy in the mail If you have any lab test that is abnormal or we need to change your treatment, we will call you to review the results.   Follow-Up: At [facility], you and your health needs are our priority.  As part of our continuing mission to provide you with exceptional heart care, we have created designated Provider Care Teams.  These Care Teams include your primary Cardiologist (physician) and Advanced Practice Providers (APPs -  Physician Assistants and Nurse Practitioners) who all work together to provide you with the care you need, when  you need it.  We recommend signing up for the patient portal called "MyChart".  Sign up information is provided on this After Visit Summary.  MyChart is used to connect with patients for Virtual Visits (Telemedicine).  Patients are able to view lab/test results, encounter notes, upcoming appointments, etc.  Non-urgent messages can be sent to your provider as well.   To learn more about what you can do with MyChart, go to [email or web address].    Your next appointment:   2-3 month(s)  The format for your next appointment:   In Person  Provider:   Carlyle Dolly, MD   Other Instructions  ---Please follow-up with you Primary Care Provider for your Diabetes.  Low-Sodium Eating Plan Sodium, which is an element that makes up salt, helps you maintain a healthy balance of fluids in your body. Too much sodium can increase your blood pressure and cause fluid and waste to be held in your body. Your health care provider or dietitian may recommend following this plan if you have high blood pressure (hypertension), kidney disease, liver disease, or heart failure. Eating less sodium can help lower your blood pressure, reduce  swelling, and protect your heart, liver, and kidneys. What are tips for following this plan? General guidelines  Most people on this plan should limit sodium intake to 2,000 mg (milligrams) of sodium each day. Reading food labels   The Nutrition Facts label lists the amount of sodium in one serving of the food. If you eat more than one serving, you must multiply the listed amount of sodium by the number of servings.  Choose foods with less than 140 mg of sodium per serving.  Avoid foods with 300 mg of sodium or more per serving. Shopping  Look for lower-sodium products, often labeled as "low-sodium" or "no salt added."  Always check the sodium content even if foods are labeled as "unsalted" or "no salt added".  Buy fresh foods. ? Avoid canned foods and premade or frozen meals. ? Avoid canned, cured, or processed meats  Buy breads that have less than 80 mg of sodium per slice. Cooking  Eat more home-cooked food and less restaurant, buffet, and fast food.  Avoid adding salt when cooking. Use salt-free seasonings or herbs instead of table salt or sea salt. Check with your health care provider or pharmacist before using salt substitutes.  Cook with plant-based oils, such as canola, sunflower, or olive oil. Meal planning  When eating at a restaurant, ask that your food be prepared with less salt or no salt, if possible.  Avoid foods that contain MSG (monosodium glutamate). MSG is sometimes added to [place] food, bouillon, and some canned foods. What foods are recommended? The items listed may not be a complete list. Talk with your dietitian about what dietary choices are best for you. Grains Low-sodium cereals, including oats, puffed wheat and rice, and shredded wheat. Low-sodium crackers. Unsalted rice. Unsalted pasta. Low-sodium bread. Whole-grain breads and whole-grain pasta. Vegetables Fresh or frozen vegetables. "No salt added" canned vegetables. "No salt added" tomato sauce  and paste. Low-sodium or reduced-sodium tomato and vegetable juice. Fruits Fresh, frozen, or canned fruit. Fruit juice. Meats and other protein foods Fresh or frozen (no salt added) meat, poultry, seafood, and fish. Low-sodium canned tuna and salmon. Unsalted nuts. Dried peas, beans, and lentils without added salt. Unsalted canned beans. Eggs. Unsalted nut butters. Dairy Milk. Soy milk. Cheese that is naturally low in sodium, such as ricotta cheese, fresh mozzarella, or Swiss cheese Low-sodium  or reduced-sodium cheese. Cream cheese. Yogurt. Fats and oils Unsalted butter. Unsalted margarine with no trans fat. Vegetable oils such as canola or olive oils. Seasonings and other foods Fresh and dried herbs and spices. Salt-free seasonings. Low-sodium mustard and ketchup. Sodium-free salad dressing. Sodium-free light mayonnaise. Fresh or refrigerated horseradish. Lemon juice. Vinegar. Homemade, reduced-sodium, or low-sodium soups. Unsalted popcorn and pretzels. Low-salt or salt-free chips. What foods are not recommended? The items listed may not be a complete list. Talk with your dietitian about what dietary choices are best for you. Grains Instant hot cereals. Bread stuffing, pancake, and biscuit mixes. Croutons. Seasoned rice or pasta mixes. Noodle soup cups. Boxed or frozen macaroni and cheese. Regular salted crackers. Self-rising flour. Vegetables Sauerkraut, pickled vegetables, and relishes. Olives. [place] fries. Onion rings. Regular canned vegetables (not low-sodium or reduced-sodium). Regular canned tomato sauce and paste (not low-sodium or reduced-sodium). Regular tomato and vegetable juice (not low-sodium or reduced-sodium). Frozen vegetables in sauces. Meats and other protein foods Meat or fish that is salted, canned, smoked, spiced, or pickled. Bacon, ham, sausage, hotdogs, corned beef, chipped beef, packaged lunch meats, salt pork, jerky, pickled herring, anchovies, regular canned tuna,  sardines, salted nuts. Dairy Processed cheese and cheese spreads. Cheese curds. Blue cheese. Feta cheese. String cheese. Regular cottage cheese. Buttermilk. Canned milk. Fats and oils Salted butter. Regular margarine. Ghee. Bacon fat. Seasonings and other foods Onion salt, garlic salt, seasoned salt, table salt, and sea salt. Canned and packaged gravies. Worcestershire sauce. Tartar sauce. Barbecue sauce. Teriyaki sauce. Soy sauce, including reduced-sodium. Steak sauce. Fish sauce. Oyster sauce. Cocktail sauce. Horseradish that you find on the shelf. Regular ketchup and mustard. Meat flavorings and tenderizers. Bouillon cubes. Hot sauce and Tabasco sauce. Premade or packaged marinades. Premade or packaged taco seasonings. Relishes. Regular salad dressings. Salsa. Potato and tortilla chips. Corn chips and puffs. Salted popcorn and pretzels. Canned or dried soups. Pizza. Frozen entrees and pot pies. Summary  Eating less sodium can help lower your blood pressure, reduce swelling, and protect your heart, liver, and kidneys.  Most people on this plan should limit their sodium intake to 1,500-2,000 mg (milligrams) of sodium each day.  Canned, boxed, and frozen foods are high in sodium. Restaurant foods, fast foods, and pizza are also very high in sodium. You also get sodium by adding salt to food.  Try to cook at home, eat more fresh fruits and vegetables, and eat less fast food, canned, processed, or prepared foods. This information is not intended to replace advice given to you by your health care provider. Make sure you discuss any questions you have with your health care provider. Document Revised: 06/22/2017 Document Reviewed: 07/03/2016 Elsevier Patient Education  2020 [place], Ermalinda Barrios, [place]  12/15/2019 2:37 PM    [facility] [place], [place], [place]  [address] Phone: [phone]; Fax: [phone]

## 2019-12-09 ENCOUNTER — Other Ambulatory Visit: Payer: Self-pay

## 2019-12-09 ENCOUNTER — Other Ambulatory Visit (HOSPITAL_COMMUNITY)
Admission: RE | Admit: 2019-12-09 | Discharge: 2019-12-09 | Disposition: A | Discharge status: Home / Self Care | Payer: BC Managed Care – PPO | Source: Ambulatory Visit | Attending: Physician Assistant | Admitting: Physician Assistant

## 2019-12-09 ENCOUNTER — Ambulatory Visit (HOSPITAL_COMMUNITY)
Admission: RE | Admit: 2019-12-09 | Discharge: 2019-12-09 | Disposition: A | Discharge status: Home / Self Care | Payer: BC Managed Care – PPO | Source: Ambulatory Visit | Attending: Physician Assistant | Admitting: Physician Assistant

## 2019-12-09 DIAGNOSIS — I5022 Chronic systolic (congestive) heart failure: Secondary | ICD-10-CM

## 2019-12-09 DIAGNOSIS — Z6841 Body Mass Index (BMI) 40.0 and over, adult: Secondary | ICD-10-CM | POA: Insufficient documentation

## 2019-12-09 DIAGNOSIS — R609 Edema, unspecified: Secondary | ICD-10-CM | POA: Diagnosis not present

## 2019-12-09 DIAGNOSIS — I110 Hypertensive heart disease with heart failure: Secondary | ICD-10-CM | POA: Diagnosis not present

## 2019-12-09 DIAGNOSIS — G4730 Sleep apnea, unspecified: Secondary | ICD-10-CM | POA: Insufficient documentation

## 2019-12-09 DIAGNOSIS — E119 Type 2 diabetes mellitus without complications: Secondary | ICD-10-CM | POA: Insufficient documentation

## 2019-12-09 DIAGNOSIS — E785 Hyperlipidemia, unspecified: Secondary | ICD-10-CM | POA: Diagnosis not present

## 2019-12-09 DIAGNOSIS — R600 Localized edema: Secondary | ICD-10-CM

## 2019-12-09 LAB — LIPID PANEL
Cholesterol: 166 mg/dL (ref 0–200)
HDL: 49 mg/dL (ref >40)
LDL Cholesterol: 81 mg/dL (ref 0–99)
Total CHOL/HDL Ratio: 3.4 RATIO
Triglycerides: 178 mg/dL — ABNORMAL HIGH (ref <150)
VLDL: 36 mg/dL (ref 0–40)

## 2019-12-09 LAB — CBC
HCT: 42.2 % (ref 36.0–46.0)
Hemoglobin: 14 g/dL (ref 12.0–15.0)
MCH: 30.6 pg (ref 26.0–34.0)
MCHC: 33.2 g/dL (ref 30.0–36.0)
MCV: 92.3 fL (ref 80.0–100.0)
Platelets: 207 10*3/uL (ref 150–400)
RBC: 4.57 MIL/uL (ref 3.87–5.11)
RDW: 11.8 % (ref 11.5–15.5)
WBC: 9.2 10*3/uL (ref 4.0–10.5)
nRBC: 0 % (ref 0.0–0.2)

## 2019-12-09 LAB — COMPREHENSIVE METABOLIC PANEL
ALT: 12 U/L (ref 0–44)
AST: 15 U/L (ref 15–41)
Albumin: 3.2 g/dL — ABNORMAL LOW (ref 3.5–5.0)
Alkaline Phosphatase: 75 U/L (ref 38–126)
Anion gap: 12 (ref 5–15)
BUN: 12 mg/dL (ref 6–20)
CO2: 32 mmol/L (ref 22–32)
Calcium: 9.6 mg/dL (ref 8.9–10.3)
Chloride: 99 mmol/L (ref 98–111)
Creatinine, Ser: 0.54 mg/dL (ref 0.44–1.00)
GFR calc Af Amer: >60 mL/min (ref >60)
GFR calc non Af Amer: >60 mL/min (ref >60)
Glucose, Bld: 201 mg/dL — ABNORMAL HIGH (ref 70–99)
Potassium: 3 mmol/L — ABNORMAL LOW (ref 3.5–5.1)
Sodium: 143 mmol/L (ref 135–145)
Total Bilirubin: 0.7 mg/dL (ref 0.3–1.2)
Total Protein: 6.5 g/dL (ref 6.5–8.1)

## 2019-12-09 LAB — TSH: TSH: 1.035 u[IU]/mL (ref 0.350–4.500)

## 2019-12-09 NOTE — Progress Notes (Signed)
*  PRELIMINARY RESULTS* Echocardiogram 2D Echocardiogram has been performed.  Jill [name] 12/09/2019, 9:31 AM

## 2019-12-10 LAB — HEMOGLOBIN A1C
Hgb A1c MFr Bld: 7.1 % — ABNORMAL HIGH (ref 4.8–5.6)
Mean Plasma Glucose: 157 mg/dL

## 2019-12-15 ENCOUNTER — Encounter: Payer: Self-pay | Admitting: Physician Assistant

## 2019-12-15 ENCOUNTER — Ambulatory Visit (INDEPENDENT_AMBULATORY_CARE_PROVIDER_SITE_OTHER): Payer: BC Managed Care – PPO | Admitting: Physician Assistant

## 2019-12-15 ENCOUNTER — Other Ambulatory Visit: Payer: Self-pay

## 2019-12-15 VITALS — BP 132/74 | HR 68 | Ht 66.5 in | Wt 211.6 lb

## 2019-12-15 DIAGNOSIS — I5032 Chronic diastolic (congestive) heart failure: Secondary | ICD-10-CM

## 2019-12-15 DIAGNOSIS — I10 Essential (primary) hypertension: Secondary | ICD-10-CM | POA: Diagnosis not present

## 2019-12-15 DIAGNOSIS — Z8673 Personal history of transient ischemic attack (TIA), and cerebral infarction without residual deficits: Secondary | ICD-10-CM

## 2019-12-15 DIAGNOSIS — R002 Palpitations: Secondary | ICD-10-CM | POA: Diagnosis not present

## 2019-12-15 DIAGNOSIS — E1169 Type 2 diabetes mellitus with other specified complication: Secondary | ICD-10-CM

## 2019-12-15 DIAGNOSIS — E669 Obesity, unspecified: Secondary | ICD-10-CM

## 2019-12-15 DIAGNOSIS — I739 Peripheral vascular disease, unspecified: Secondary | ICD-10-CM

## 2019-12-15 DIAGNOSIS — I2510 Atherosclerotic heart disease of native coronary artery without angina pectoris: Secondary | ICD-10-CM

## 2019-12-15 MED ORDER — POTASSIUM CHLORIDE CRYS ER 20 MEQ PO TBCR: EXTENDED_RELEASE_TABLET | ORAL | 0 refills | Status: DC

## 2019-12-15 MED ORDER — ROSUVASTATIN CALCIUM 40 MG PO TABS: 40.0000 mg | ORAL_TABLET | Freq: Every day | ORAL | 1 refills | Status: AC

## 2019-12-15 NOTE — Patient Instructions (Signed)
Medication Instructions:   INCREASE Rosuvastatin (Crestor) to 40 mg daily.  START Potassium 20 mEq---take 2 tablets today and 2 tablets tomorrow (5/25); then take 1 tablet daily with your Lasix dose.  *If you need a refill on your cardiac medications before your next appointment, please call your pharmacy*   Lab Work: Your physician recommends that you return for lab work next week: BMET  Your physician recommends that you return for a FASTING lipid profile in 3 months (end of August).   If you have labs (blood work) drawn today and your tests are completely normal, you will receive your results only by: Marland Kitchen MyChart Message (if you have MyChart) OR . A paper copy in the mail If you have any lab test that is abnormal or we need to change your treatment, we will call you to review the results.   Follow-Up: At [facility], you and your health needs are our priority.  As part of our continuing mission to provide you with exceptional heart care, we have created designated Provider Care Teams.  These Care Teams include your primary Cardiologist (physician) and Advanced Practice Providers (APPs -  Physician Assistants and Nurse Practitioners) who all work together to provide you with the care you need, when you need it.  We recommend signing up for the patient portal called "MyChart".  Sign up information is provided on this After Visit Summary.  MyChart is used to connect with patients for Virtual Visits (Telemedicine).  Patients are able to view lab/test results, encounter notes, upcoming appointments, etc.  Non-urgent messages can be sent to your provider as well.   To learn more about what you can do with MyChart, go to [email or web address].    Your next appointment:   2-3 month(s)  The format for your next appointment:   In Person  Provider:   Carlyle Dolly, MD   Other Instructions  ---Please follow-up with you Primary Care Provider for your Diabetes.  Low-Sodium Eating  Plan Sodium, which is an element that makes up salt, helps you maintain a healthy balance of fluids in your body. Too much sodium can increase your blood pressure and cause fluid and waste to be held in your body. Your health care provider or dietitian may recommend following this plan if you have high blood pressure (hypertension), kidney disease, liver disease, or heart failure. Eating less sodium can help lower your blood pressure, reduce swelling, and protect your heart, liver, and kidneys. What are tips for following this plan? General guidelines  Most people on this plan should limit sodium intake to 2,000 mg (milligrams) of sodium each day. Reading food labels   The Nutrition Facts label lists the amount of sodium in one serving of the food. If you eat more than one serving, you must multiply the listed amount of sodium by the number of servings.  Choose foods with less than 140 mg of sodium per serving.  Avoid foods with 300 mg of sodium or more per serving. Shopping  Look for lower-sodium products, often labeled as "low-sodium" or "no salt added."  Always check the sodium content even if foods are labeled as "unsalted" or "no salt added".  Buy fresh foods. ? Avoid canned foods and premade or frozen meals. ? Avoid canned, cured, or processed meats  Buy breads that have less than 80 mg of sodium per slice. Cooking  Eat more home-cooked food and less restaurant, buffet, and fast food.  Avoid adding salt when cooking. Use salt-free seasonings or  herbs instead of table salt or sea salt. Check with your health care provider or pharmacist before using salt substitutes.  Cook with plant-based oils, such as canola, sunflower, or olive oil. Meal planning  When eating at a restaurant, ask that your food be prepared with less salt or no salt, if possible.  Avoid foods that contain MSG (monosodium glutamate). MSG is sometimes added to [place] food, bouillon, and some canned  foods. What foods are recommended? The items listed may not be a complete list. Talk with your dietitian about what dietary choices are best for you. Grains Low-sodium cereals, including oats, puffed wheat and rice, and shredded wheat. Low-sodium crackers. Unsalted rice. Unsalted pasta. Low-sodium bread. Whole-grain breads and whole-grain pasta. Vegetables Fresh or frozen vegetables. "No salt added" canned vegetables. "No salt added" tomato sauce and paste. Low-sodium or reduced-sodium tomato and vegetable juice. Fruits Fresh, frozen, or canned fruit. Fruit juice. Meats and other protein foods Fresh or frozen (no salt added) meat, poultry, seafood, and fish. Low-sodium canned tuna and salmon. Unsalted nuts. Dried peas, beans, and lentils without added salt. Unsalted canned beans. Eggs. Unsalted nut butters. Dairy Milk. Soy milk. Cheese that is naturally low in sodium, such as ricotta cheese, fresh mozzarella, or Swiss cheese Low-sodium or reduced-sodium cheese. Cream cheese. Yogurt. Fats and oils Unsalted butter. Unsalted margarine with no trans fat. Vegetable oils such as canola or olive oils. Seasonings and other foods Fresh and dried herbs and spices. Salt-free seasonings. Low-sodium mustard and ketchup. Sodium-free salad dressing. Sodium-free light mayonnaise. Fresh or refrigerated horseradish. Lemon juice. Vinegar. Homemade, reduced-sodium, or low-sodium soups. Unsalted popcorn and pretzels. Low-salt or salt-free chips. What foods are not recommended? The items listed may not be a complete list. Talk with your dietitian about what dietary choices are best for you. Grains Instant hot cereals. Bread stuffing, pancake, and biscuit mixes. Croutons. Seasoned rice or pasta mixes. Noodle soup cups. Boxed or frozen macaroni and cheese. Regular salted crackers. Self-rising flour. Vegetables Sauerkraut, pickled vegetables, and relishes. Olives. [place] fries. Onion rings. Regular canned vegetables  (not low-sodium or reduced-sodium). Regular canned tomato sauce and paste (not low-sodium or reduced-sodium). Regular tomato and vegetable juice (not low-sodium or reduced-sodium). Frozen vegetables in sauces. Meats and other protein foods Meat or fish that is salted, canned, smoked, spiced, or pickled. Bacon, ham, sausage, hotdogs, corned beef, chipped beef, packaged lunch meats, salt pork, jerky, pickled herring, anchovies, regular canned tuna, sardines, salted nuts. Dairy Processed cheese and cheese spreads. Cheese curds. Blue cheese. Feta cheese. String cheese. Regular cottage cheese. Buttermilk. Canned milk. Fats and oils Salted butter. Regular margarine. Ghee. Bacon fat. Seasonings and other foods Onion salt, garlic salt, seasoned salt, table salt, and sea salt. Canned and packaged gravies. Worcestershire sauce. Tartar sauce. Barbecue sauce. Teriyaki sauce. Soy sauce, including reduced-sodium. Steak sauce. Fish sauce. Oyster sauce. Cocktail sauce. Horseradish that you find on the shelf. Regular ketchup and mustard. Meat flavorings and tenderizers. Bouillon cubes. Hot sauce and Tabasco sauce. Premade or packaged marinades. Premade or packaged taco seasonings. Relishes. Regular salad dressings. Salsa. Potato and tortilla chips. Corn chips and puffs. Salted popcorn and pretzels. Canned or dried soups. Pizza. Frozen entrees and pot pies. Summary  Eating less sodium can help lower your blood pressure, reduce swelling, and protect your heart, liver, and kidneys.  Most people on this plan should limit their sodium intake to 1,500-2,000 mg (milligrams) of sodium each day.  Canned, boxed, and frozen foods are high in sodium. Restaurant foods, fast foods,  and pizza are also very high in sodium. You also get sodium by adding salt to food.  Try to cook at home, eat more fresh fruits and vegetables, and eat less fast food, canned, processed, or prepared foods. This information is not intended to replace  advice given to you by your health care provider. Make sure you discuss any questions you have with your health care provider. Document Revised: 06/22/2017 Document Reviewed: 07/03/2016 Elsevier Patient Education  2020 [employer].

## 2019-12-17 ENCOUNTER — Telehealth: Payer: Self-pay | Admitting: *Deleted

## 2019-12-17 NOTE — Telephone Encounter (Signed)
-----   Message from Imogene Burn, PA-C sent at 12/09/2019 11:19 AM EDT ----- Thyroid normal, LDL 81 and triglycerides high-increase crestor 40 mg daily, potassium low. Take Kdur 20 meq 2 day and 2 tomorrow then one daily with lasix. Repeat bmet in 1 week. Needs better diabetes control

## 2019-12-18 ENCOUNTER — Telehealth: Payer: Self-pay | Admitting: *Deleted

## 2019-12-18 DIAGNOSIS — I10 Essential (primary) hypertension: Secondary | ICD-10-CM

## 2019-12-18 NOTE — Telephone Encounter (Signed)
-----   Message from Imogene Burn, PA-C sent at 12/09/2019 11:19 AM EDT ----- Thyroid normal, LDL 81 and triglycerides high-increase crestor 40 mg daily, potassium low. Take Kdur 20 meq 2 day and 2 tomorrow then one daily with lasix. Repeat bmet in 1 week. Needs better diabetes control

## 2019-12-24 ENCOUNTER — Other Ambulatory Visit (HOSPITAL_COMMUNITY)
Admission: RE | Admit: 2019-12-24 | Discharge: 2019-12-24 | Disposition: A | Discharge status: Home / Self Care | Payer: BC Managed Care – PPO | Source: Ambulatory Visit | Attending: Physician Assistant | Admitting: Physician Assistant

## 2019-12-24 DIAGNOSIS — R002 Palpitations: Secondary | ICD-10-CM | POA: Diagnosis not present

## 2019-12-24 DIAGNOSIS — I10 Essential (primary) hypertension: Secondary | ICD-10-CM | POA: Diagnosis not present

## 2019-12-24 DIAGNOSIS — E1169 Type 2 diabetes mellitus with other specified complication: Secondary | ICD-10-CM | POA: Diagnosis not present

## 2019-12-24 DIAGNOSIS — Z8673 Personal history of transient ischemic attack (TIA), and cerebral infarction without residual deficits: Secondary | ICD-10-CM | POA: Insufficient documentation

## 2019-12-24 DIAGNOSIS — I739 Peripheral vascular disease, unspecified: Secondary | ICD-10-CM | POA: Diagnosis not present

## 2019-12-24 DIAGNOSIS — I5032 Chronic diastolic (congestive) heart failure: Secondary | ICD-10-CM | POA: Insufficient documentation

## 2019-12-24 LAB — BASIC METABOLIC PANEL
Anion gap: 13 (ref 5–15)
BUN: 17 mg/dL (ref 6–20)
CO2: 27 mmol/L (ref 22–32)
Calcium: 10.4 mg/dL — ABNORMAL HIGH (ref 8.9–10.3)
Chloride: 100 mmol/L (ref 98–111)
Creatinine, Ser: 0.85 mg/dL (ref 0.44–1.00)
GFR calc Af Amer: >60 mL/min (ref >60)
GFR calc non Af Amer: >60 mL/min (ref >60)
Glucose, Bld: 176 mg/dL — ABNORMAL HIGH (ref 70–99)
Potassium: 3.5 mmol/L (ref 3.5–5.1)
Sodium: 140 mmol/L (ref 135–145)

## 2019-12-30 DIAGNOSIS — I70203 Unspecified atherosclerosis of native arteries of extremities, bilateral legs: Secondary | ICD-10-CM | POA: Diagnosis not present

## 2019-12-30 DIAGNOSIS — I10 Essential (primary) hypertension: Secondary | ICD-10-CM | POA: Diagnosis not present

## 2019-12-30 DIAGNOSIS — E1165 Type 2 diabetes mellitus with hyperglycemia: Secondary | ICD-10-CM | POA: Diagnosis not present

## 2019-12-30 DIAGNOSIS — R569 Unspecified convulsions: Secondary | ICD-10-CM | POA: Diagnosis not present

## 2020-02-17 ENCOUNTER — Other Ambulatory Visit: Payer: Self-pay | Admitting: Physician Assistant

## 2020-03-16 NOTE — Progress Notes (Signed)
Virtual Visit via Video Note   This visit type was conducted due to national recommendations for restrictions regarding the COVID-19 Pandemic (e.g. social distancing) in an effort to limit this patient's exposure and mitigate transmission in our community.  Due to her co-morbid illnesses, this patient is at least at moderate risk for complications without adequate follow up.  This format is felt to be most appropriate for this patient at this time.  All issues noted in this document were discussed and addressed.  A limited physical exam was performed with this format.  Please refer to the patient's chart for her consent to telehealth for [facility].  Video Connection Lost Video connection was lost at < 50% of the duration of this visit, at which time the remainder of the visit was completed via audio only.     Date:  03/22/2020   ID:  Jill [name], DOB 08/14/1965, MRN [id number] The patient was identified using 2 identifiers.  Patient Location: Home Provider Location: Office/Clinic  PCP:  Iona Beard, MD  Cardiologist:  Carlyle Dolly, MD   Electrophysiologist:  None   Evaluation Performed:  Follow-Up Visit  Chief Complaint:  Follow  History of Present Illness:    Jill [name] is a 54 y.o. female with history of chest pain and abnormal stress test prior to bariatric surgery in 2016 led to cardiac cath that showed mild to moderate nonobstructive disease, hypertension, HLD, orthostatic dizziness improved with changing Lasix to as needed, with history of PTA and stenting of the right common iliac and right SFA 07/2008, OSA on CPAP, carotid stenosis that is post left ICA 09/2014 on aspirin and Plavix     I saw the patient 12/01/2019 and was having right leg swelling for about a month but not taking her as needed Lasix because of history of orthostasis on it in the past..  Was getting extra salt in her diet.  Also complained of palpitations since her second Covid vaccine.  Added  Lasix 40 mg once daily and metoprolol XL 25 mg daily.  Echo and labs scheduled 12/09/2019.  Echo showed normal LV function EF 60 to 65% with grade 1 DD.  Potassium was low at 3.0 creatinine 0.54 LDL 81 triglycerides 178 A1C-7.1.  I increased her Crestor to 40 mg daily and replaced her potassium.  Patient is doing well. No swelling or palpitations. Walks some but no set routine. Received moderna covid 19 booster    The patient does not have symptoms concerning for COVID-19 infection (fever, chills, cough, or new shortness of breath).    Past Medical History:  Diagnosis Date  . Anxiety   . Back pain    complains when laying on hard flat surface  . Chronic kidney disease    appt. /w urology 11/10/2014- for a cyst seen on [place]. evaluated was told it was nothing.  . Diabetes mellitus    Diabetes II, oral and insulin.  Marland Kitchen DVT (deep venous thrombosis) (HCC)    right leg '09 or '10  . Fibroids   . GERD (gastroesophageal reflux disease)   . Heart murmur   . History of stress test    referred for cardiac cath- done here at [facility]- 09/2014  . Hypercholesterolemia   . Hypertension   . PONV (postoperative nausea and vomiting)   . Seizures ([place])    04/2010 x1- Dr. Colan Neptune x2 yearly.  . Sleep apnea    CPAP q night , last study > 3 yrs. ago  .  Stroke [facility])    '09-had blockage in brain, too deep to operate"occ left lower extremity fatique, occ. 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   Past Surgical History:  Procedure Laterality Date  . APPENDECTOMY    . CARDIAC CATHETERIZATION    . [place]   x2  . CHOLECYSTECTOMY     open"gallstones"  . DILATATION & CURETTAGE/HYSTEROSCOPY WITH MYOSURE N/A 05/10/2017   Procedure: DILATATION & CURETTAGE/HYSTEROSCOPY WITH MYOSURE;  Surgeon: Servando Salina, MD;  Location: [facility];  Service: Gynecology;  Laterality: N/A;  . INTERVENTIONAL RADIOLOGY  PROCEDURE     CT angiography /neck -Dr. Estanislado Pandy.  . IR ANGIO INTRA EXTRACRAN SEL COM CAROTID INNOMINATE BILAT MOD SED  02/22/2018  . IR ANGIO VERTEBRAL SEL VERTEBRAL BILAT MOD SED  02/22/2018  . IR GENERIC HISTORICAL  08/03/2016   IR RADIOLOGIST EVAL & MGMT 08/03/2016 [facility]-INTERV RAD  . LAPAROSCOPIC GASTRIC SLEEVE RESECTION N/A 04/10/2016   Procedure: LAPAROSCOPIC GASTRIC SLEEVE RESECTION WITH UPPER ENDO;  Surgeon: Greer Pickerel, MD;  Location: [facility];  Service: General;  Laterality: N/A;  . LEFT HEART CATHETERIZATION WITH CORONARY ANGIOGRAM N/A 09/28/2014   Procedure: LEFT HEART CATHETERIZATION WITH CORONARY ANGIOGRAM;  Surgeon: Jettie Booze, MD;  Location: [facility] CATH LAB;  Service: Cardiovascular;  Laterality: N/A;  . RADIOLOGY WITH ANESTHESIA N/A 10/14/2014   Procedure: ANGIOPLASTY;  Surgeon: Luanne Bras, MD;  Location: [place];  Service: Radiology;  Laterality: N/A;  . TUBAL LIGATION       Current Meds  Medication Sig  . acetaminophen (TYLENOL) 500 MG tablet Take 500-1,000 mg by mouth every 8 (eight) hours as needed for moderate pain or headache.   Marland Kitchen aspirin EC 81 MG tablet Take 81 mg by mouth every morning.  . calcium citrate-vitamin D 500-400 MG-UNIT chewable tablet Chew 2 tablets by mouth 2 (two) times daily.  . clopidogrel (PLAVIX) 75 MG tablet Take 75 mg by mouth every morning.  . Cyanocobalamin (VITAMIN B-12) 5000 MCG SUBL Place 5,000 mcg under the tongue 2 (two) times a week.   . docusate sodium (COLACE) 100 MG capsule Take 100 mg by mouth daily.  . felodipine (PLENDIL) 5 MG 24 hr tablet Take 5 mg by mouth daily.   . furosemide (LASIX) 20 MG tablet Take 20 mg by mouth daily.  Marland Kitchen gabapentin (NEURONTIN) 300 MG capsule Take 300 mg by mouth at bedtime.   Marland Kitchen ibuprofen (ADVIL,MOTRIN) 800 MG tablet Take 1 tablet (800 mg total) by mouth every 8 (eight) hours as needed.  . lamoTRIgine (LAMICTAL) 100 MG tablet Take 100 mg by mouth every morning.   . levETIRAcetam (KEPPRA) 500 MG tablet Take  250 mg by mouth every 12 (twelve) hours.  Marland Kitchen LORazepam (ATIVAN) 1 MG tablet Take 1 mg by mouth daily as needed for anxiety.  . metFORMIN (GLUCOPHAGE) 1000 MG tablet Take 1,000 mg by mouth 2 (two) times daily.  . metoprolol succinate (TOPROL-XL) 25 MG 24 hr tablet Take 1 tablet by mouth daily  . Multiple Vitamin (MULITIVITAMIN WITH MINERALS) TABS Take 1 tablet by mouth daily.   Marland Kitchen olmesartan (BENICAR) 40 MG tablet Take 40 mg by mouth every morning.   Marland Kitchen OZEMPIC, 1 MG/DOSE, 2 MG/1.5ML SOPN Inject 1 mg into the skin once a week.  . pantoprazole (PROTONIX) 40 MG tablet Take 40 mg by mouth daily.   . pioglitazone (ACTOS) 15 MG tablet Take  15 mg by mouth every morning.   . potassium chloride (KLOR-CON) 10 MEQ tablet Take 20 mEq by mouth daily.  . rosuvastatin (CRESTOR) 40 MG tablet Take 1 tablet (40 mg total) by mouth at bedtime.     Allergies:   Dilaudid [hydromorphone hcl], Crab [shellfish allergy], and Other   Social History   Tobacco Use  . Smoking status: Current Some Day Smoker    Packs/day: 0.50    Years: 41.00    Pack years: 20.50    Types: Cigarettes    Start date: 07/25/1975  . Smokeless tobacco: Never Used  Vaping Use  . Vaping Use: Never used  Substance Use Topics  . Alcohol use: Yes    Alcohol/week: 0.0 standard drinks    Comment: rare use   . Drug use: No     Family Hx: The patient's family history includes Heart attack in her father and mother; Heart disease in her father; Heart failure in her father; Kidney disease in her mother; Stroke in her paternal grandmother.  ROS:   Please see the history of present illness.      All other systems reviewed and are negative.   Prior CV studies:   The following studies were reviewed today:  2Decho 12/09/19 IMPRESSIONS     1. Left ventricular ejection fraction, by estimation, is 60 to 65%. The  left ventricle has normal function. The left ventricle has no regional  wall motion abnormalities. There is moderate left  ventricular hypertrophy.  Left ventricular diastolic  parameters are consistent with Grade I diastolic dysfunction (impaired  relaxation).   2. Right ventricular systolic function is normal. The right ventricular  size is normal.   3. The mitral valve is normal in structure. No evidence of mitral valve  regurgitation. No evidence of mitral stenosis.   4. The aortic valve is tricuspid. Aortic valve regurgitation is not  visualized. No aortic stenosis is present.   5. The inferior vena cava is dilated in size with >50% respiratory  variability, suggesting right atrial pressure of 8 mmHg.   FINDINGS   Left Ventricle: Left ventricular ejection fraction, by estimation, is 60  to 65%. The left ventricle has normal function. The left ventricle has no  regional wall motion abnormalities. The left ventricular internal cavity  size was normal in size. There is   moderate left ventricular hypertrophy. Left ventricular diastolic  parameters are consistent with Grade I diastolic dysfunction (impaired  relaxation). Normal left ventricular filling pressure.   Right Ventricle: The right ventricular size is normal. No increase in  right ventricular wall thickness. Right ventricular systolic function is  normal.   Left Atrium: Left atrial size was normal in size.   Right Atrium: Right atrial size was normal in size.   Pericardium: There is no evidence of pericardial effusion.   Mitral Valve: The mitral valve is normal in structure. No evidence of  mitral valve regurgitation. No evidence of mitral valve stenosis.   Tricuspid Valve: The tricuspid valve is normal in structure. Tricuspid  valve regurgitation is trivial. No evidence of tricuspid stenosis.   Aortic Valve: The aortic valve is tricuspid. Aortic valve regurgitation is  not visualized. No aortic stenosis is present. Aortic valve mean gradient  measures 4.2 mmHg. Aortic valve peak gradient measures 8.5 mmHg. Aortic  valve area, by VTI  measures 2.70  cm.   Pulmonic Valve: The pulmonic valve was not well visualized. Pulmonic valve  regurgitation is not visualized. No evidence of pulmonic stenosis.  Aorta: The aortic root is normal in size and structure.   Pulmonary Artery: Indeterminant PASP, inadequate TR jet.   Venous: The inferior vena cava is dilated in size with greater than 50%  respiratory variability, suggesting right atrial pressure of 8 mmHg.   IAS/Shunts: No atrial level shunt detected by color flow Doppler.         08/2014 Lexiscan MPI IMPRESSION: 1. Large area of ischemia extending from the basal to distal anteroseptal wall.   2. Normal left ventricular wall motion.   3. Left ventricular ejection fraction 60 for%   4. High-risk stress test findings*.   09/2014 Cath HEMODYNAMICS:  Aortic pressure was 142/66; LV pressure was 144/7; LVEDP 18.  There was no gradient between the left ventricle and aorta.     ANGIOGRAPHIC DATA:   The left main coronary artery is widely patent.   The left anterior descending artery is a large vessel which wraps around the apex. There is mild atherosclerosis in the mid vessel. There is a large diagonal vessel which arises proximally. There are several small diagonals which are patent.   The left circumflex artery is a large dominant vessel. There is a ramus vessel which is large and widely patent. There is mild disease in the circumflex system. The first obtuse marginal is small but patent.  There is a small left PDA which is patent.   The right coronary artery is a nondominant vessel. In the mid vessel, there is moderate disease.   LEFT VENTRICULOGRAM:  Left ventricular angiogram was not done due to the question of a dye allergy.  LVEDP was 18 mmHg.   IMPRESSIONS:    1. Normal left main coronary artery. 2. Mild disease in the left anterior descending artery and its branches. 3. Mild disease in the dominant left circumflex artery and its branches. 4. Moderate  disease in the nondominant right coronary artery. 5. Left ventricular systolic function not assessed.  LVEDP 18 mmHg.  6.   False positive nuclear stress test.   RECOMMENDATION:  Continue aggressive preventive therapy given her history of peripheral vascular disease and multiple risk factors for coronary atherosclerosis. She will follow-up with Dr. Harl Bowie.           Labs/Other Tests and Data Reviewed:    EKG:  No ECG reviewed.  Recent Labs: 12/09/2019: ALT 12; Hemoglobin 14.0; Platelets 207; TSH 1.035 12/24/2019: BUN 17; Creatinine, Ser 0.85; Potassium 3.5; Sodium 140   Recent Lipid Panel Lab Results  Component Value Date/Time   CHOL 166 12/09/2019 09:33 AM   TRIG 178 (H) 12/09/2019 09:33 AM   HDL 49 12/09/2019 09:33 AM   CHOLHDL 3.4 12/09/2019 09:33 AM   LDLCALC 81 12/09/2019 09:33 AM    Wt Readings from Last 3 Encounters:  03/22/20 201 lb 3.2 oz (91.3 kg)  12/15/19 211 lb 9.6 oz (96 kg)  12/01/19 213 lb (96.6 kg)     Objective:    Vital Signs:  BP 135/80   Pulse 73   Ht 5' 6.5" (1.689 m)   Wt 201 lb 3.2 oz (91.3 kg)   LMP 12/10/2014 Comment: pt states that there is no chance or pregnancy  SpO2 98%   BMI 31.99 kg/m    VITAL SIGNS:  reviewed GEN:  no acute distress RESPIRATORY:  normal respiratory effort, symmetric expansion CARDIOVASCULAR:  no peripheral edema  ASSESSMENT & PLAN:    Chronic diastolic CHF.  Patient was getting extra salt in her diet and not taking Lasix.  I  started Lasix 40 mg once daily.  2D echo 12/09/2019 normal LVEF with grade 1 DD.  Palpitations since her second Covid vaccine.  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   COVID-19 Education: The signs and symptoms of COVID-19 were discussed with the patient and how to seek care for testing (follow up with PCP or arrange E-visit).   The importance of social distancing was discussed today.  Time:   Today, I have spent 10 minutes with the patient with telehealth technology discussing the above problems.     Medication Adjustments/Labs and Tests Ordered: Current medicines are reviewed at length with the patient today.  Concerns regarding medicines are outlined above.   Tests Ordered: No orders of the defined types were placed in this encounter.   Medication Changes: No orders of the defined types were placed in this encounter.   Follow Up:  In Person in 6 month(s) Dr. Harl Bowie  Signed, Ermalinda Barrios, PA-C  03/22/2020 11:48 AM    [place]

## 2020-03-17 ENCOUNTER — Ambulatory Visit: Payer: BC Managed Care – PPO | Admitting: Cardiology

## 2020-03-22 ENCOUNTER — Encounter: Payer: Self-pay | Admitting: Physician Assistant

## 2020-03-22 ENCOUNTER — Other Ambulatory Visit: Payer: Self-pay

## 2020-03-22 ENCOUNTER — Telehealth (INDEPENDENT_AMBULATORY_CARE_PROVIDER_SITE_OTHER): Payer: BC Managed Care – PPO | Admitting: Physician Assistant

## 2020-03-22 VITALS — BP 135/80 | HR 73 | Ht 66.5 in | Wt 201.2 lb

## 2020-03-22 DIAGNOSIS — I739 Peripheral vascular disease, unspecified: Secondary | ICD-10-CM

## 2020-03-22 DIAGNOSIS — I2510 Atherosclerotic heart disease of native coronary artery without angina pectoris: Secondary | ICD-10-CM

## 2020-03-22 DIAGNOSIS — R002 Palpitations: Secondary | ICD-10-CM

## 2020-03-22 DIAGNOSIS — I5032 Chronic diastolic (congestive) heart failure: Secondary | ICD-10-CM

## 2020-03-22 DIAGNOSIS — E785 Hyperlipidemia, unspecified: Secondary | ICD-10-CM | POA: Diagnosis not present

## 2020-03-22 MED ORDER — POTASSIUM CHLORIDE CRYS ER 10 MEQ PO TBCR: 20.0000 meq | EXTENDED_RELEASE_TABLET | Freq: Every day | ORAL | 3 refills | Status: DC

## 2020-03-22 NOTE — Patient Instructions (Signed)
Medication Instructions:  Your physician recommends that you continue on your current medications as directed. Please refer to the Current Medication list given to you today.  *If you need a refill on your cardiac medications before your next appointment, please call your pharmacy*   Lab Work: None today If you have labs (blood work) drawn today and your tests are completely normal, you will receive your results only by: . MyChart Message (if you have MyChart) OR . A paper copy in the mail If you have any lab test that is abnormal or we need to change your treatment, we will call you to review the results.   Testing/Procedures: None today   Follow-Up: At [facility], you and your health needs are our priority.  As part of our continuing mission to provide you with exceptional heart care, we have created designated Provider Care Teams.  These Care Teams include your primary Cardiologist (physician) and Advanced Practice Providers (APPs -  Physician Assistants and Nurse Practitioners) who all work together to provide you with the care you need, when you need it.  We recommend signing up for the patient portal called "MyChart".  Sign up information is provided on this After Visit Summary.  MyChart is used to connect with patients for Virtual Visits (Telemedicine).  Patients are able to view lab/test results, encounter notes, upcoming appointments, etc.  Non-urgent messages can be sent to your provider as well.   To learn more about what you can do with MyChart, go to [email or web address].    Your next appointment:   6 month(s)  The format for your next appointment:   In Person  Provider:   Jonathan Branch, MD   Other Instructions None       Thank you for choosing [facility] !         

## 2020-03-30 DIAGNOSIS — R197 Diarrhea, unspecified: Secondary | ICD-10-CM | POA: Diagnosis not present

## 2020-03-30 DIAGNOSIS — R5383 Other fatigue: Secondary | ICD-10-CM | POA: Diagnosis not present

## 2020-05-31 DIAGNOSIS — K5900 Constipation, unspecified: Secondary | ICD-10-CM | POA: Diagnosis not present

## 2020-05-31 DIAGNOSIS — Z1211 Encounter for screening for malignant neoplasm of colon: Secondary | ICD-10-CM | POA: Diagnosis not present

## 2020-05-31 DIAGNOSIS — E119 Type 2 diabetes mellitus without complications: Secondary | ICD-10-CM | POA: Diagnosis not present

## 2020-05-31 DIAGNOSIS — I6789 Other cerebrovascular disease: Secondary | ICD-10-CM | POA: Diagnosis not present

## 2020-06-15 ENCOUNTER — Telehealth: Payer: Self-pay | Admitting: *Deleted

## 2020-06-15 NOTE — Telephone Encounter (Signed)
° °  [facility] Pre-operative Risk Assessment    HEARTCARE STAFF: - Please ensure there is not already an duplicate clearance open for this procedure. - Under Visit Info/Reason for Call, type in Other and utilize the format Clearance MM/DD/YY or Clearance TBD. Do not use dashes or single digits. - If request is for dental extraction, please clarify the # of teeth to be extracted.  Request for surgical clearance:  1. What type of surgery is being performed? COLONOSCOPY   2. When is this surgery scheduled? 06/29/20   3. What type of clearance is required (medical clearance vs. Pharmacy clearance to hold med vs. Both)? MEDICAL  4. Are there any medications that need to be held prior to surgery and how long? PLAVIX   5. Practice name and name of physician performing surgery? [place]; DR. HUNG   6. What is the office phone number? [phone]   7.   What is the office fax number? [phone]  8.   Anesthesia type (None, local, MAC, general) ? PROPOFOL   Jill [name] 06/15/2020, 4:49 PM  _________________________________________________________________   (provider comments below)

## 2020-06-16 NOTE — Telephone Encounter (Signed)
Left message to call back and ask to speak with pre-op team.  Of note, looks like patient is on Plavix for history of PAD. She has a history of PTA and stenting of right common iliac and right SFA in 07/2008. She also has history of carotid stenosis s/p left ICA angioplasty in 2016. It does not look like we have been prescribing her Plavix. Can confirm we we talk with her but can likely defer Plavix recommendations to PCP/Vascular.  Darreld Mclean, PA-C 06/16/2020 8:26 AM

## 2020-06-22 NOTE — Telephone Encounter (Signed)
   Primary Cardiologist: Carlyle Dolly, MD  Chart reviewed as part of pre-operative protocol coverage. Patient was contacted 06/22/2020 in reference to pre-operative risk assessment for pending surgery as outlined below.  RACHYL WUEBKER was last seen on 03/22/20 by Ermalinda Barrios, PA-C for chronic diastolic HF, non obstructive CAD on caht 2016.  Since that day, NYA MONDS has done well from cardiac perspective.     She is on asa and plavix not from cardiology but PCP/Neurology post CVA and carotid procedure.  Her PCP would need to give directions on those meds.   Therefore, based on ACC/AHA guidelines, the patient would be at acceptable risk for the planned procedure without further cardiovascular testing.   The patient was advised that if she develops new symptoms prior to surgery to contact our office to arrange for a follow-up visit, and she verbalized understanding.  I will route this recommendation to the requesting party via Epic fax function and remove from pre-op pool. Please call with questions.  Cecilie Kicks, NP 06/22/2020, 1:36 PM

## 2020-06-22 NOTE — Telephone Encounter (Signed)
LMTCB and to see who prescribes plavix  06/22/20

## 2020-06-29 DIAGNOSIS — Z1211 Encounter for screening for malignant neoplasm of colon: Secondary | ICD-10-CM | POA: Diagnosis not present

## 2020-08-25 DIAGNOSIS — E785 Hyperlipidemia, unspecified: Secondary | ICD-10-CM | POA: Diagnosis not present

## 2020-08-25 DIAGNOSIS — L0232 Furuncle of buttock: Secondary | ICD-10-CM | POA: Diagnosis not present

## 2020-08-25 DIAGNOSIS — I10 Essential (primary) hypertension: Secondary | ICD-10-CM | POA: Diagnosis not present

## 2020-08-25 DIAGNOSIS — E118 Type 2 diabetes mellitus with unspecified complications: Secondary | ICD-10-CM | POA: Diagnosis not present

## 2020-08-25 DIAGNOSIS — Z7189 Other specified counseling: Secondary | ICD-10-CM | POA: Diagnosis not present

## 2020-09-08 ENCOUNTER — Other Ambulatory Visit (HOSPITAL_COMMUNITY): Payer: Self-pay | Admitting: Family Medicine

## 2020-09-08 DIAGNOSIS — R799 Abnormal finding of blood chemistry, unspecified: Secondary | ICD-10-CM | POA: Diagnosis not present

## 2020-09-08 DIAGNOSIS — Z7189 Other specified counseling: Secondary | ICD-10-CM | POA: Diagnosis not present

## 2020-09-08 DIAGNOSIS — Z1231 Encounter for screening mammogram for malignant neoplasm of breast: Secondary | ICD-10-CM

## 2020-09-08 DIAGNOSIS — E785 Hyperlipidemia, unspecified: Secondary | ICD-10-CM | POA: Diagnosis not present

## 2020-09-08 DIAGNOSIS — E118 Type 2 diabetes mellitus with unspecified complications: Secondary | ICD-10-CM | POA: Diagnosis not present

## 2020-09-23 ENCOUNTER — Encounter: Payer: Self-pay | Admitting: Cardiology

## 2020-09-23 ENCOUNTER — Other Ambulatory Visit: Payer: Self-pay

## 2020-09-23 ENCOUNTER — Ambulatory Visit (INDEPENDENT_AMBULATORY_CARE_PROVIDER_SITE_OTHER): Payer: BC Managed Care – PPO | Admitting: Cardiology

## 2020-09-23 VITALS — BP 126/76 | HR 80 | Ht 66.5 in | Wt 211.0 lb

## 2020-09-23 DIAGNOSIS — Z8673 Personal history of transient ischemic attack (TIA), and cerebral infarction without residual deficits: Secondary | ICD-10-CM

## 2020-09-23 DIAGNOSIS — I2510 Atherosclerotic heart disease of native coronary artery without angina pectoris: Secondary | ICD-10-CM

## 2020-09-23 DIAGNOSIS — E782 Mixed hyperlipidemia: Secondary | ICD-10-CM | POA: Diagnosis not present

## 2020-09-23 DIAGNOSIS — I10 Essential (primary) hypertension: Secondary | ICD-10-CM | POA: Diagnosis not present

## 2020-09-23 NOTE — Patient Instructions (Signed)
Medication Instructions:   Your physician recommends that you continue on your current medications as directed. Please refer to the Current Medication list given to you today.  *If you need a refill on your cardiac medications before your next appointment, please call your pharmacy*   Lab Work: None today  If you have labs (blood work) drawn today and your tests are completely normal, you will receive your results only by: . MyChart Message (if you have MyChart) OR . A paper copy in the mail If you have any lab test that is abnormal or we need to change your treatment, we will call you to review the results.   Testing/Procedures: None today   Follow-Up: At [facility], you and your health needs are our priority.  As part of our continuing mission to provide you with exceptional heart care, we have created designated Provider Care Teams.  These Care Teams include your primary Cardiologist (physician) and Advanced Practice Providers (APPs -  Physician Assistants and Nurse Practitioners) who all work together to provide you with the care you need, when you need it.  We recommend signing up for the patient portal called "MyChart".  Sign up information is provided on this After Visit Summary.  MyChart is used to connect with patients for Virtual Visits (Telemedicine).  Patients are able to view lab/test results, encounter notes, upcoming appointments, etc.  Non-urgent messages can be sent to your provider as well.   To learn more about what you can do with MyChart, go to [email or web address].    Your next appointment:   12 month(s)  The format for your next appointment:   In Person  Provider:   Jonathan Branch, MD   Other Instructions None      Thank you for choosing [facility] !         

## 2020-09-23 NOTE — Progress Notes (Signed)
Clinical Summary Jill [name] is a 55 y.o.female seen today for follow up of the following medical problems.   1.Orthostatic dizziness - last visit reported significant symptoms of dizziness with standing. Was found to be orthostatic in clinic - since changing lasix to prn only, symptosm have resolved. Maintaining adequate hydration.  - no recent symptoms.   2. Obesity - history of bariatric surgery,   3. Abnormal stress test/Chest pain/CAD - as part of a bariatric surgery preop workup she had a nuclear stress that was abnormal - follow up cath 09/2014 showed mild to moderate non-obstructive disease.   - no recent chest pain. No sob or DOE - compliant with meds   4. Hyperlipidemia  - labs followed by pcp - compliant with statin  5. HTN  Compliant with meds  6. PAD - prior right lower intervention with PTA and stenting of right common iliac and right SFA in Jan 2010 -  Denies any claudication symptoms  7. OSA -remainscompliant with CPAP   8. Carotid stenosis - followed by vascular. History of left ICA angioplasty 09/2014 for which she remains on ASA and plavix for.  9. Chronic diastolic HF - occasional swelling, comes and goes but overall controlled Past Medical History:  Diagnosis Date  . Anxiety   . Back pain    complains when laying on hard flat surface  . Chronic kidney disease    appt. /w urology 11/10/2014- for a cyst seen on [place]. evaluated was told it was nothing.  . Diabetes mellitus    Diabetes II, oral and insulin.  Marland Kitchen DVT (deep venous thrombosis) (HCC)    right leg '09 or '10  . Fibroids   . GERD (gastroesophageal reflux disease)   . Heart murmur   . History of stress test    referred for cardiac cath- done here at [facility]- 09/2014  . Hypercholesterolemia   . Hypertension   . PONV (postoperative nausea and vomiting)   . Seizures ([place])    04/2010 x1- Dr. Colan Neptune x2 yearly.  . Sleep apnea    CPAP q night , last study >  3 yrs. ago  . Stroke [facility])    '09-had blockage in brain, too deep to operate"occ left lower extremity fatique, occ. 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     Allergies  Allergen Reactions  . Dilaudid [Hydromorphone Hcl] Nausea And Vomiting  . Crab [Shellfish Allergy] Swelling  . Other Other (See Comments)    "pt is a difficult IV stick, prefers IV team paged for IV starts"     Current Outpatient Medications  Medication Sig Dispense Refill  . acetaminophen (TYLENOL) 500 MG tablet Take 500-1,000 mg by mouth every 8 (eight) hours as needed for moderate pain or headache.     Marland Kitchen aspirin EC 81 MG tablet Take 81 mg by mouth every morning.    . calcium citrate-vitamin D 500-400 MG-UNIT chewable tablet Chew 2 tablets by mouth 2 (two) times daily.    . clopidogrel (PLAVIX) 75 MG tablet Take 75 mg by mouth every morning.    . Cyanocobalamin (VITAMIN B-12) 5000 MCG SUBL Place 5,000 mcg under the tongue 2 (two) times a week.     . docusate sodium (COLACE) 100 MG capsule Take 100 mg by mouth daily.    . felodipine (PLENDIL) 5 MG 24 hr tablet Take 5 mg by mouth daily.     Marland Kitchen  furosemide (LASIX) 20 MG tablet Take 20 mg by mouth daily.    Marland Kitchen gabapentin (NEURONTIN) 300 MG capsule Take 300 mg by mouth at bedtime.   0  . ibuprofen (ADVIL,MOTRIN) 800 MG tablet Take 1 tablet (800 mg total) by mouth every 8 (eight) hours as needed. 30 tablet 0  . lamoTRIgine (LAMICTAL) 100 MG tablet Take 100 mg by mouth every morning.     . levETIRAcetam (KEPPRA) 500 MG tablet Take 250 mg by mouth every 12 (twelve) hours.    Marland Kitchen LORazepam (ATIVAN) 1 MG tablet Take 1 mg by mouth daily as needed for anxiety.    . metFORMIN (GLUCOPHAGE) 1000 MG tablet Take 1,000 mg by mouth 2 (two) times daily.    . metoprolol succinate (TOPROL-XL) 25 MG 24 hr tablet Take 1 tablet by mouth daily 90 tablet 2  . Multiple Vitamin (MULITIVITAMIN WITH MINERALS) TABS  Take 1 tablet by mouth daily.     Marland Kitchen olmesartan (BENICAR) 40 MG tablet Take 40 mg by mouth every morning.     Marland Kitchen OZEMPIC, 1 MG/DOSE, 2 MG/1.5ML SOPN Inject 1 mg into the skin once a week.    . pantoprazole (PROTONIX) 40 MG tablet Take 40 mg by mouth daily.     . pioglitazone (ACTOS) 15 MG tablet Take 15 mg by mouth every morning.     . potassium chloride (KLOR-CON) 10 MEQ tablet Take 2 tablets (20 mEq total) by mouth daily. 180 tablet 3  . rosuvastatin (CRESTOR) 40 MG tablet Take 1 tablet (40 mg total) by mouth at bedtime. 90 tablet 1   No current facility-administered medications for this visit.     Past Surgical History:  Procedure Laterality Date  . APPENDECTOMY    . CARDIAC CATHETERIZATION    . [place]   x2  . CHOLECYSTECTOMY     open"gallstones"  . DILATATION & CURETTAGE/HYSTEROSCOPY WITH MYOSURE N/A 05/10/2017   Procedure: DILATATION & CURETTAGE/HYSTEROSCOPY WITH MYOSURE;  Surgeon: Servando Salina, MD;  Location: [facility];  Service: Gynecology;  Laterality: N/A;  . INTERVENTIONAL RADIOLOGY PROCEDURE     CT angiography /neck -Dr. Estanislado Pandy.  . IR ANGIO INTRA EXTRACRAN SEL COM CAROTID INNOMINATE BILAT MOD SED  02/22/2018  . IR ANGIO VERTEBRAL SEL VERTEBRAL BILAT MOD SED  02/22/2018  . IR GENERIC HISTORICAL  08/03/2016   IR RADIOLOGIST EVAL & MGMT 08/03/2016 [facility]-INTERV RAD  . LAPAROSCOPIC GASTRIC SLEEVE RESECTION N/A 04/10/2016   Procedure: LAPAROSCOPIC GASTRIC SLEEVE RESECTION WITH UPPER ENDO;  Surgeon: Greer Pickerel, MD;  Location: [facility];  Service: General;  Laterality: N/A;  . LEFT HEART CATHETERIZATION WITH CORONARY ANGIOGRAM N/A 09/28/2014   Procedure: LEFT HEART CATHETERIZATION WITH CORONARY ANGIOGRAM;  Surgeon: Jettie Booze, MD;  Location: [facility] CATH LAB;  Service: Cardiovascular;  Laterality: N/A;  . RADIOLOGY WITH ANESTHESIA N/A 10/14/2014   Procedure: ANGIOPLASTY;  Surgeon: Luanne Bras, MD;  Location: [place];  Service: Radiology;  Laterality: N/A;  .  TUBAL LIGATION       Allergies  Allergen Reactions  . Dilaudid [Hydromorphone Hcl] Nausea And Vomiting  . Crab [Shellfish Allergy] Swelling  . Other Other (See Comments)    "pt is a difficult IV stick, prefers IV team paged for IV starts"      Family History  Problem Relation Age of Onset  . Heart failure Father   . Heart disease Father   . Heart attack Father        72's  . Kidney disease  Mother        dialysis for 26 years  . Heart attack Mother        71's  . Stroke Paternal Grandmother      Social History Jill [name] reports that she has been smoking cigarettes. She started smoking about 45 years ago. She has a 20.50 pack-year smoking history. She has never used smokeless tobacco. Jill [name] reports current alcohol use.   Review of Systems CONSTITUTIONAL: No weight loss, fever, chills, weakness or fatigue.  HEENT: Eyes: No visual loss, blurred vision, double vision or yellow sclerae.No hearing loss, sneezing, congestion, runny nose or sore throat.  SKIN: No rash or itching.  CARDIOVASCULAR: per hpi RESPIRATORY: No shortness of breath, cough or sputum.  GASTROINTESTINAL: No anorexia, nausea, vomiting or diarrhea. No abdominal pain or blood.  GENITOURINARY: No burning on urination, no polyuria NEUROLOGICAL: No headache, dizziness, syncope, paralysis, ataxia, numbness or tingling in the extremities. No change in bowel or bladder control.  MUSCULOSKELETAL: No muscle, back pain, joint pain or stiffness.  LYMPHATICS: No enlarged nodes. No history of splenectomy.  PSYCHIATRIC: No history of depression or anxiety.  ENDOCRINOLOGIC: No reports of sweating, cold or heat intolerance. No polyuria or polydipsia.  Marland Kitchen   Physical Examination Today's Vitals   09/23/20 1319  BP: 126/76  Pulse: 80  SpO2: 97%  Weight: 211 lb (95.7 kg)  Height: 5' 6.5" (1.689 m)   Body mass index is 33.55 kg/m.  Gen: resting comfortably, no acute distress HEENT: no scleral icterus, pupils  equal round and reactive, no palptable cervical adenopathy,  CV: RRR, no m/r/g, no jvd Resp: Clear to auscultation bilaterally GI: abdomen is soft, non-tender, non-distended, normal bowel sounds, no hepatosplenomegaly MSK: extremities are warm, no edema.  Skin: warm, no rash Neuro:  no focal deficits Psych: appropriate affect   Diagnostic Studies 08/2014 Lexiscan MPI IMPRESSION: 1. Large area of ischemia extending from the basal to distal anteroseptal wall.  2. Normal left ventricular wall motion.  3. Left ventricular ejection fraction 60 for%  4. High-risk stress test findings*.  09/2014 Cath HEMODYNAMICS: Aortic pressure was 142/66; LV pressure was 144/7; LVEDP 18. There was no gradient between the left ventricle and aorta.   ANGIOGRAPHIC DATA: The left main coronary artery is widely patent.  The left anterior descending artery is a large vessel which wraps around the apex. There is mild atherosclerosis in the mid vessel. There is a large diagonal vessel which arises proximally. There are several small diagonals which are patent.  The left circumflex artery is a large dominant vessel. There is a ramus vessel which is large and widely patent. There is mild disease in the circumflex system. The first obtuse marginal is small but patent. There is a small left PDA which is patent.  The right coronary artery is a nondominant vessel. In the mid vessel, there is moderate disease.  LEFT VENTRICULOGRAM: Left ventricular angiogram was not done due to the question of a dye allergy. LVEDP was 18 mmHg.  IMPRESSIONS:  1. Normal left main coronary artery. 2. Mild disease in the left anterior descending artery and its branches. 3. Mild disease in the dominant left circumflex artery and its branches. 4. Moderate disease in the nondominant right coronary artery. 5. Left ventricular systolic function not assessed. LVEDP 18 mmHg. 6. False positive nuclear stress  test.    Assessment and Plan  1. Abnormal stress test/Chest pain/CAD - mild to mod disease by 2016 cath - no symptoms - continue medical therapy  2. Hyperlipidemia - request pcp labs, continue statin  3. HTN - at goal, continue current meds   4.. Hx of CVA - known cerebral vascular disease, s/p carotid angiogplasty. - had been followed by IR Dr Estanislado Pandy, will touch base on any monitoring she required, verify continued need for DAPT   F/u 1 year   [name], M.D.

## 2020-09-29 ENCOUNTER — Telehealth: Payer: Self-pay | Admitting: *Deleted

## 2020-09-29 DIAGNOSIS — Z8673 Personal history of transient ischemic attack (TIA), and cerebral infarction without residual deficits: Secondary | ICD-10-CM

## 2020-09-29 NOTE — Telephone Encounter (Signed)
Can we let patient know we heard back from Dr Estanislado Pandy. He recommends staying on aspirin and plavix permanently. Also has asked to get a CTA head and neck, can we order please    Zandra Abts MD  Called to notify pt. No answer. Left msg to call back. Order placed.

## 2020-09-30 NOTE — Telephone Encounter (Signed)
Pt voiced understanding that she will stay on Plavix and Aspirin permanently and that she will have CTA of head and neck.

## 2020-10-25 ENCOUNTER — Ambulatory Visit (HOSPITAL_COMMUNITY)
Admission: RE | Admit: 2020-10-25 | Discharge: 2020-10-25 | Disposition: A | Discharge status: Home / Self Care | Payer: BC Managed Care – PPO | Source: Ambulatory Visit | Attending: Family Medicine | Admitting: Family Medicine

## 2020-10-25 ENCOUNTER — Other Ambulatory Visit: Payer: Self-pay

## 2020-10-25 DIAGNOSIS — Z1231 Encounter for screening mammogram for malignant neoplasm of breast: Secondary | ICD-10-CM | POA: Diagnosis not present

## 2020-10-28 ENCOUNTER — Ambulatory Visit (HOSPITAL_COMMUNITY)
Admission: RE | Admit: 2020-10-28 | Discharge: 2020-10-28 | Disposition: A | Discharge status: Home / Self Care | Payer: BC Managed Care – PPO | Source: Ambulatory Visit | Attending: Cardiology | Admitting: Cardiology

## 2020-10-28 DIAGNOSIS — I672 Cerebral atherosclerosis: Secondary | ICD-10-CM | POA: Diagnosis not present

## 2020-10-28 DIAGNOSIS — G9389 Other specified disorders of brain: Secondary | ICD-10-CM | POA: Diagnosis not present

## 2020-10-28 DIAGNOSIS — I708 Atherosclerosis of other arteries: Secondary | ICD-10-CM | POA: Diagnosis not present

## 2020-10-28 DIAGNOSIS — Z8673 Personal history of transient ischemic attack (TIA), and cerebral infarction without residual deficits: Secondary | ICD-10-CM | POA: Insufficient documentation

## 2020-10-28 DIAGNOSIS — I6523 Occlusion and stenosis of bilateral carotid arteries: Secondary | ICD-10-CM | POA: Diagnosis not present

## 2020-10-28 LAB — POCT I-STAT CREATININE: Creatinine, Ser: 0.7 mg/dL (ref 0.44–1.00)

## 2020-10-28 MED ORDER — IOHEXOL 350 MG/ML SOLN
75.0000 mL | Freq: Once | INTRAVENOUS | Status: AC | PRN
  Administered 2020-10-28: 75 mL via INTRAVENOUS

## 2020-11-11 ENCOUNTER — Telehealth: Payer: Self-pay | Admitting: *Deleted

## 2020-11-11 NOTE — Telephone Encounter (Signed)
-----   Message from [name], MD sent at 11/09/2020  9:43 AM EDT ----- Please let patient now we received her CTA results and have forwarded to Dr Arlean Hopping office to review. From my view stable findings but we will get his thoughts  Jill Abts MD

## 2020-11-11 NOTE — Telephone Encounter (Signed)
Pt voiced understanding

## 2020-11-15 ENCOUNTER — Telehealth (HOSPITAL_COMMUNITY): Payer: Self-pay

## 2020-11-15 NOTE — Telephone Encounter (Signed)
Pt agreed to f/u in 6 months with cta head/neck. AW 

## 2020-12-08 DIAGNOSIS — F411 Generalized anxiety disorder: Secondary | ICD-10-CM | POA: Diagnosis not present

## 2020-12-08 DIAGNOSIS — E1169 Type 2 diabetes mellitus with other specified complication: Secondary | ICD-10-CM | POA: Diagnosis not present

## 2020-12-08 DIAGNOSIS — E782 Mixed hyperlipidemia: Secondary | ICD-10-CM | POA: Diagnosis not present

## 2020-12-08 DIAGNOSIS — I10 Essential (primary) hypertension: Secondary | ICD-10-CM | POA: Diagnosis not present

## 2020-12-08 DIAGNOSIS — Z7189 Other specified counseling: Secondary | ICD-10-CM | POA: Diagnosis not present

## 2021-04-11 DIAGNOSIS — Z6832 Body mass index (BMI) 32.0-32.9, adult: Secondary | ICD-10-CM | POA: Diagnosis not present

## 2021-04-11 DIAGNOSIS — G4733 Obstructive sleep apnea (adult) (pediatric): Secondary | ICD-10-CM | POA: Diagnosis not present

## 2021-04-11 DIAGNOSIS — E1169 Type 2 diabetes mellitus with other specified complication: Secondary | ICD-10-CM | POA: Diagnosis not present

## 2021-04-11 DIAGNOSIS — E785 Hyperlipidemia, unspecified: Secondary | ICD-10-CM | POA: Diagnosis not present

## 2021-04-11 DIAGNOSIS — I10 Essential (primary) hypertension: Secondary | ICD-10-CM | POA: Diagnosis not present

## 2021-04-11 DIAGNOSIS — R569 Unspecified convulsions: Secondary | ICD-10-CM | POA: Diagnosis not present

## 2021-04-11 DIAGNOSIS — I70203 Unspecified atherosclerosis of native arteries of extremities, bilateral legs: Secondary | ICD-10-CM | POA: Diagnosis not present

## 2021-05-23 DIAGNOSIS — K5900 Constipation, unspecified: Secondary | ICD-10-CM | POA: Diagnosis not present

## 2021-05-23 DIAGNOSIS — K603 Anal fistula: Secondary | ICD-10-CM | POA: Diagnosis not present

## 2021-05-25 ENCOUNTER — Other Ambulatory Visit: Payer: Self-pay | Admitting: Physician Assistant

## 2021-08-16 DIAGNOSIS — E785 Hyperlipidemia, unspecified: Secondary | ICD-10-CM | POA: Diagnosis not present

## 2021-08-16 DIAGNOSIS — R569 Unspecified convulsions: Secondary | ICD-10-CM | POA: Diagnosis not present

## 2021-08-16 DIAGNOSIS — I10 Essential (primary) hypertension: Secondary | ICD-10-CM | POA: Diagnosis not present

## 2021-08-16 DIAGNOSIS — G4733 Obstructive sleep apnea (adult) (pediatric): Secondary | ICD-10-CM | POA: Diagnosis not present

## 2021-08-16 DIAGNOSIS — I70203 Unspecified atherosclerosis of native arteries of extremities, bilateral legs: Secondary | ICD-10-CM | POA: Diagnosis not present

## 2021-08-16 DIAGNOSIS — E1141 Type 2 diabetes mellitus with diabetic mononeuropathy: Secondary | ICD-10-CM | POA: Diagnosis not present

## 2021-08-16 DIAGNOSIS — E1169 Type 2 diabetes mellitus with other specified complication: Secondary | ICD-10-CM | POA: Diagnosis not present

## 2021-08-30 ENCOUNTER — Other Ambulatory Visit: Payer: Self-pay | Admitting: Physician Assistant

## 2021-09-07 ENCOUNTER — Other Ambulatory Visit (HOSPITAL_COMMUNITY): Payer: Self-pay | Admitting: Family Medicine

## 2021-09-07 DIAGNOSIS — Z1231 Encounter for screening mammogram for malignant neoplasm of breast: Secondary | ICD-10-CM

## 2021-09-30 ENCOUNTER — Telehealth: Payer: Self-pay | Admitting: Cardiology

## 2021-09-30 NOTE — Telephone Encounter (Signed)
Patient wanted to know if her appointment 10/04/21 could be done as a telehealth visit. Please advise  ?

## 2021-09-30 NOTE — Telephone Encounter (Signed)
Informed patient would be great if she could keep in person, no EKG since 2021 & not seen in office since in 1 year.  States she will keep as is for now.   ?

## 2021-10-04 ENCOUNTER — Ambulatory Visit (INDEPENDENT_AMBULATORY_CARE_PROVIDER_SITE_OTHER): Payer: BC Managed Care – PPO | Admitting: Cardiology

## 2021-10-04 ENCOUNTER — Other Ambulatory Visit: Payer: Self-pay

## 2021-10-04 ENCOUNTER — Encounter: Payer: Self-pay | Admitting: Cardiology

## 2021-10-04 VITALS — BP 120/70 | HR 66 | Ht 66.5 in | Wt 212.0 lb

## 2021-10-04 DIAGNOSIS — G4730 Sleep apnea, unspecified: Secondary | ICD-10-CM | POA: Diagnosis not present

## 2021-10-04 DIAGNOSIS — R109 Unspecified abdominal pain: Secondary | ICD-10-CM

## 2021-10-04 DIAGNOSIS — I10 Essential (primary) hypertension: Secondary | ICD-10-CM | POA: Diagnosis not present

## 2021-10-04 DIAGNOSIS — E782 Mixed hyperlipidemia: Secondary | ICD-10-CM

## 2021-10-04 DIAGNOSIS — I2510 Atherosclerotic heart disease of native coronary artery without angina pectoris: Secondary | ICD-10-CM | POA: Diagnosis not present

## 2021-10-04 NOTE — Progress Notes (Signed)
? ? ? ?Clinical Summary ?Jill [name] is a 56 y.o.female seen today for follow up of the following medical problems.  ?  ?1. Orthostatic dizziness ?-previously found to be orthostatic in clinic ?- since changing lasix to prn only, symptosm have resolved. Maintaining adequate hydration.   ?  ?- no recent symptoms.  ?  ? 2. Obesity ?- history of bariatric surgery, ?  ?  ?3. Abnormal stress test/Chest pain/CAD ?- as part of a bariatric surgery preop workup she had a nuclear stress that was abnormal ?- follow up cath 09/2014 showed mild to moderate non-obstructive disease.  ?  ?- no recent chest pain ?- compliant with meds ?  ?  ?4. Hyperlipidemia   ? ? ?11/2019 TC 166 TG 178 HDL 49 LDL 81 ?- more recent labs with pcp ?- she is on crestor '40mg'$  daily  ? ?5. HTN   ?-she is compliant wth meds ?  ?6. PAD ?- prior right lower intervention with PTA and stenting of right common iliac and right SFA in Jan 2010 ?-  no recent symptoms.  ?  ?7. OSA ?- remains compliant with CPAP ?- has not had regular f/u for OSA in several years ?  ?  ?8. Carotid stenosis ?- followed by vascular. History of left ICA angioplasty 09/2014 for which she remains on ASA and plavix for.  ?- from 09/2020 telephone neuro interventional Dr Estanislado Pandy had recommended lifelong DAPT.  ?CTA we ordered per Dr Arlean Hopping request and forwarded results to him. ?- she is not sure what additional f/u she needs from Dr Estanislado Pandy ?  ?9. Chronic diastolic HF ?- controlled, she is taking lasix '40mg'$  daily.  ? ? ?Past Medical History:  ?Diagnosis Date  ? Anxiety   ? Back pain   ? complains when laying on hard flat surface  ? Chronic kidney disease   ? appt. /w urology 11/10/2014- for a cyst seen on [place]. evaluated was told it was nothing.  ? Diabetes mellitus   ? Diabetes II, oral and insulin.  ? DVT (deep venous thrombosis) ([place])   ? right leg '09 or '10  ? Fibroids   ? GERD (gastroesophageal reflux disease)   ? Heart murmur   ? History of stress test   ? referred for cardiac  cath- done here at [facility]- 09/2014  ? Hypercholesterolemia   ? Hypertension   ? PONV (postoperative nausea and vomiting)   ? Seizures ([place])   ? 04/2010 x1- Dr. Colan Neptune x2 yearly.  ? Sleep apnea   ? CPAP q night , last study > 3 yrs. ago  ? Stroke [facility])   ? 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  ? ? ? ?Allergies  ?Allergen Reactions  ? Dilaudid [Hydromorphone Hcl] Nausea And Vomiting  ? Crab [Shellfish Allergy] Swelling  ? Other Other (See Comments)  ?  "pt is a difficult IV stick, prefers IV team paged for IV starts"  ? ? ? ?Current Outpatient Medications  ?Medication Sig Dispense Refill  ? acetaminophen (TYLENOL) 500 MG tablet Take 500-1,000 mg by mouth every 8 (eight) hours as needed for moderate pain or headache.     ? aspirin EC 81 MG tablet Take 81 mg by mouth every morning.    ? calcium citrate-vitamin D 500-400 MG-UNIT chewable tablet Chew 2 tablets by mouth  2 (two) times daily.    ? clopidogrel (PLAVIX) 75 MG tablet Take 75 mg by mouth every morning.    ? Cyanocobalamin (VITAMIN B-12) 5000 MCG SUBL Place 5,000 mcg under the tongue 2 (two) times a week.     ? docusate sodium (COLACE) 100 MG capsule Take 100 mg by mouth daily.    ? felodipine (PLENDIL) 5 MG 24 hr tablet Take 5 mg by mouth daily.     ? furosemide (LASIX) 20 MG tablet Take 20 mg by mouth daily.    ? gabapentin (NEURONTIN) 300 MG capsule Take 300 mg by mouth at bedtime.   0  ? ibuprofen (ADVIL,MOTRIN) 800 MG tablet Take 1 tablet (800 mg total) by mouth every 8 (eight) hours as needed. 30 tablet 0  ? lamoTRIgine (LAMICTAL) 100 MG tablet Take 100 mg by mouth every morning.     ? levETIRAcetam (KEPPRA) 500 MG tablet Take 250 mg by mouth every 12 (twelve) hours.    ? LORazepam (ATIVAN) 1 MG tablet Take 1 mg by mouth daily as needed for anxiety.    ? metFORMIN (GLUCOPHAGE)  1000 MG tablet Take 1,000 mg by mouth 2 (two) times daily.    ? metoprolol succinate (TOPROL-XL) 25 MG 24 hr tablet Take 1 tablet by mouth daily 90 tablet 2  ? Multiple Vitamin (MULITIVITAMIN WITH MINERALS) TABS Take 1 tablet by mouth daily.     ? olmesartan (BENICAR) 40 MG tablet Take 40 mg by mouth every morning.     ? OZEMPIC, 1 MG/DOSE, 2 MG/1.5ML SOPN Inject 1 mg into the skin once a week.    ? pantoprazole (PROTONIX) 40 MG tablet Take 40 mg by mouth daily.     ? pioglitazone (ACTOS) 15 MG tablet Take 15 mg by mouth every morning.     ? potassium chloride (KLOR-CON M) 10 MEQ tablet TAKE 2 TABLETS BY MOUTH DAILY. 180 tablet 3  ? rosuvastatin (CRESTOR) 40 MG tablet Take 1 tablet (40 mg total) by mouth at bedtime. 90 tablet 1  ? ?No current facility-administered medications for this visit.  ? ? ? ?Past Surgical History:  ?Procedure Laterality Date  ? APPENDECTOMY    ? CARDIAC CATHETERIZATION    ? [place]  ? x2  ? CHOLECYSTECTOMY    ? open"gallstones"  ? DILATATION & CURETTAGE/HYSTEROSCOPY WITH MYOSURE N/A 05/10/2017  ? Procedure: [place];  Surgeon: Servando Salina, MD;  Location: [place] [facility];  Service: Gynecology;  Laterality: N/A;  ? INTERVENTIONAL RADIOLOGY PROCEDURE    ? CT angiography /neck -Dr. Estanislado Pandy.  ? IR ANGIO INTRA EXTRACRAN SEL COM CAROTID INNOMINATE BILAT MOD SED  02/22/2018  ? IR ANGIO VERTEBRAL SEL VERTEBRAL BILAT MOD SED  02/22/2018  ? IR GENERIC HISTORICAL  08/03/2016  ? IR RADIOLOGIST EVAL & MGMT 08/03/2016 [facility] RAD  ? LAPAROSCOPIC GASTRIC SLEEVE RESECTION N/A 04/10/2016  ? Procedure: LAPAROSCOPIC GASTRIC SLEEVE RESECTION WITH UPPER ENDO;  Surgeon: Greer Pickerel, MD;  Location: [facility];  Service: General;  Laterality: N/A;  ? LEFT HEART CATHETERIZATION WITH CORONARY ANGIOGRAM N/A 09/28/2014  ? Procedure: LEFT HEART CATHETERIZATION WITH CORONARY ANGIOGRAM;  Surgeon: Jettie Booze, MD;  Location: [facility] C[facility];  Service: Cardiovascular;   Laterality: N/A;  ? RADIOLOGY WITH ANESTHESIA N/A 10/14/2014  ? Procedure: ANGIOPLASTY;  Surgeon: Luanne Bras, MD;  Location: [place];  Service: Radiology;  Laterality: N/A;  ? TUBAL LIGATION    ? ? ? ?Allergies  ?Allergen Reactions  ?  Dilaudid [Hydromorphone Hcl] Nausea And Vomiting  ? Crab [Shellfish Allergy] Swelling  ? Other Other (See Comments)  ?  "pt is a difficult IV stick, prefers IV team paged for IV starts"  ? ? ? ? ?Family History  ?Problem Relation Age of Onset  ? Heart failure Father   ? Heart disease Father   ? Heart attack Father   ?     55's  ? Kidney disease Mother   ?     dialysis for 26 years  ? Heart attack Mother   ?     4's  ? Stroke Paternal Grandmother   ? ? ? ?Social History ?Jill [name] reports that she has been smoking cigarettes. She started smoking about 46 years ago. She has a 20.50 pack-year smoking history. She has never used smokeless tobacco. ?Jill [name] reports current alcohol use. ? ? ?Review of Systems ?CONSTITUTIONAL: No weight loss, fever, chills, weakness or fatigue.  ?HEENT: Eyes: No visual loss, blurred vision, double vision or yellow sclerae.No hearing loss, sneezing, congestion, runny nose or sore throat.  ?SKIN: No rash or itching.  ?CARDIOVASCULAR: per hpi ?RESPIRATORY: No shortness of breath, cough or sputum.  ?GASTROINTESTINAL: No anorexia, nausea, vomiting or diarrhea. No abdominal pain or blood.  ?GENITOURINARY: No burning on urination, no polyuria ?NEUROLOGICAL: No headache, dizziness, syncope, paralysis, ataxia, numbness or tingling in the extremities. No change in bowel or bladder control.  ?MUSCULOSKELETAL: No muscle, back pain, joint pain or stiffness.  ?LYMPHATICS: No enlarged nodes. No history of splenectomy.  ?PSYCHIATRIC: No history of depression or anxiety.  ?ENDOCRINOLOGIC: No reports of sweating, cold or heat intolerance. No polyuria or polydipsia.  ?. ? ? ?Physical Examination ?Today's Vitals  ? 10/04/21 1430  ?BP: 120/70  ?Pulse: 66  ?SpO2: 98%   ?Weight: 212 lb (96.2 kg)  ?Height: 5' 6.5" (1.689 m)  ? ?Body mass index is 33.71 kg/m?. ? ?Gen: resting comfortably, no acute distress ?HEENT: no scleral icterus, pupils equal round and reactive, no pal

## 2021-10-04 NOTE — Patient Instructions (Addendum)
Medication Instructions:  ?Your physician recommends that you continue on your current medications as directed. Please refer to the Current Medication list given to you today.  ? ?Labwork: ?none ? ?Testing/Procedures: ?none ? ?Follow-Up: ?Your physician recommends that you schedule a follow-up appointment in: 1 year ? ?Any Other Special Instructions Will Be Listed Below (If Applicable). ? ?You have been referred to pulmonary and gastroenterology ? ?You will receive a reminder call in about 10 months reminding you to schedule your appointment. If you don't receive this call, please contact our office. ? ? ?If you need a refill on your cardiac medications before your next appointment, please call your pharmacy. ? ?

## 2021-10-05 ENCOUNTER — Encounter (INDEPENDENT_AMBULATORY_CARE_PROVIDER_SITE_OTHER): Payer: Self-pay | Admitting: *Deleted

## 2021-10-06 ENCOUNTER — Telehealth: Payer: Self-pay | Admitting: *Deleted

## 2021-10-06 NOTE — Telephone Encounter (Signed)
Patient notified and verbalized understanding. 

## 2021-10-06 NOTE — Telephone Encounter (Signed)
-----   Message from [name], MD sent at 10/05/2021  5:07 PM EDT ----- ?Can you let patient know we heard from Dr Estanislado Pandy, they should contact her for CTA next month ? ?Zandra Abts MD ?----- Message ----- ?From: Luanne Bras, MD ?Sent: 10/05/2021  11:34 AM EDT ?To: [name], MD ? ?OK  will set her up for CTA of the head and neck in April 2023 ?----- Message ----- ?From: [name], MD ?Sent: 10/04/2021   3:10 PM EDT ?To: Luanne Bras, MD ? ?Dr Estanislado Pandy, ?Mutual patient of ours I saw today. She is not sure of what follow appointemnts  or surveillance imaging you may need for her, please review and contact her to arrange what she may need ? ? ?Zandra Abts MD ? ? ? ?

## 2021-10-13 ENCOUNTER — Encounter (INDEPENDENT_AMBULATORY_CARE_PROVIDER_SITE_OTHER): Payer: Self-pay | Admitting: Gastroenterology

## 2021-10-13 ENCOUNTER — Ambulatory Visit (INDEPENDENT_AMBULATORY_CARE_PROVIDER_SITE_OTHER): Payer: BC Managed Care – PPO | Admitting: Gastroenterology

## 2021-10-13 ENCOUNTER — Other Ambulatory Visit: Payer: Self-pay

## 2021-10-13 DIAGNOSIS — K603 Anal fistula: Secondary | ICD-10-CM

## 2021-10-13 DIAGNOSIS — K625 Hemorrhage of anus and rectum: Secondary | ICD-10-CM | POA: Diagnosis not present

## 2021-10-13 DIAGNOSIS — R1010 Upper abdominal pain, unspecified: Secondary | ICD-10-CM

## 2021-10-13 DIAGNOSIS — K5900 Constipation, unspecified: Secondary | ICD-10-CM | POA: Insufficient documentation

## 2021-10-13 DIAGNOSIS — R198 Other specified symptoms and signs involving the digestive system and abdomen: Secondary | ICD-10-CM

## 2021-10-13 DIAGNOSIS — R109 Unspecified abdominal pain: Secondary | ICD-10-CM | POA: Insufficient documentation

## 2021-10-13 NOTE — Patient Instructions (Signed)
Perform blood workup ?Perform stool workup ?Schedule CT abdomen/pelvis with IV contrast ?If negative findings on imaging, will proceed with EGD and colonoscopy ?

## 2021-10-13 NOTE — Progress Notes (Signed)
Jill [name], M.D. ?Gastroenterology & Hepatology ?[facility] ?[address] ?[place], [place] [address] ?Primary Care Physician: ?Iona Beard, MD ?[address] ?[place] [address] ? ?Referring MD: PCP ? ?Chief Complaint:  change in bowel movement frequency, rectal bleeding, abdominal pain ? ?History of Present Illness: ?Jill [name] is a 56 y.o. female with past medical history of CKD, GERD, hyperlipidemia, hypertension, coronary artery disease status post cath showing mild to moderate nonobstructive disease, chronic diastolic heart failure, diabetes, DVT, sleep apnea, stroke, seizures, obesity status post sleeve gastrectomy who presents for evaluation of change in bowel movement frequency, rectal bleeding, abdominal pain. ? ?Patient reports that she has presented episodes of abdominal pain for the last 6-8 weeks. Pain has been located in the upper abdomen and does not radiate anywhere else. She describes the pain as a burning and "churning" pain that does not radiate anywhere else. She states that she takes peptobismol to improve her pain. Has also tried using Nauzene (sodium citrate) which she has found helpful. The pain is intermittent, and usually is present after eating. However, there is no correlation with the type of food and the presence of pain. ? ?States that she was having significant constipation in the past and was put on Linzess by a gastroenterologist Carol Ada) that did a colonoscopy last year - no reports available but she states it was normal. States that after her sleeve gastrectomy in 2017 she presented significant constipation for which she was taking colace as she was having a bowel movement every 3 days. Since trying Linzess for the first time in 04/2021 her bowel movement frequency changed. Now, she may have a bowel movement every day, but she has some days now she can have 6-7 watery bowel movements. She has noticed some  blood when she wipes. States feeling some mild bloating. ? 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  She initially had a perianal abscess that was draining.  She reports there was some plan by Dr. Johney Maine to remove the fistula but she canceled the procedure. States that in her last colonoscopy she was told she had no fistula. ? ?The patient denies having any nausea, vomiting, fever, chills, melena, hematemesis, jaundice, pruritus or recent weight loss. ? ?Notably, no recent abdominal imaging is available. ? ?Last EGD:not recently, had it multiple years ago but no report available, states it was normal ?Last Colonoscopy:as above ? ?FHx: neg for any gastrointestinal/liver disease, no malignancies ?Social: former smoking quit 5 weeks ago, neg alcohol or illicit drug use ?Surgical: cholecystectomy, sleeve, c-section ? ?Past Medical History: ?Past Medical History:  ?Diagnosis Date  ? Anxiety   ? Back pain   ? complains when laying on hard flat surface  ? Chronic kidney disease   ? appt. /w urology 11/10/2014- for a cyst seen on [place]. evaluated was told it was nothing.  ? Diabetes mellitus   ? Diabetes II, oral and insulin.  ? DVT (deep venous thrombosis) ([place])   ? right leg '09 or '10  ? Fibroids   ? GERD (gastroesophageal reflux disease)   ? Heart murmur   ? History of stress test   ? referred for cardiac cath- done here at [facility]- 09/2014  ? Hypercholesterolemia   ? Hypertension   ? PONV (postoperative nausea and vomiting)   ? Seizures ([place])   ? 04/2010 x1- Dr. Colan Neptune x2 yearly.  ? Sleep apnea   ? CPAP q night ,  last study > 3 yrs. ago  ? Stroke [facility])   ? 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  ? ? ?Past Surgical History: ?Past Surgical History:  ?Procedure Laterality Date  ? APPENDECTOMY    ? CARDIAC CATHETERIZATION    ? [place]  ? x2  ? CHOLECYSTECTOMY    ? open"gallstones"  ? DILATATION & CURETTAGE/HYSTEROSCOPY WITH MYOSURE N/A 05/10/2017  ? Procedure: [place];  Surgeon: Servando Salina, MD;  Location: [place] [facility];  Service: Gynecology;  Laterality: N/A;  ? INTERVENTIONAL RADIOLOGY PROCEDURE    ? CT angiography /neck -Dr. Estanislado Pandy.  ? IR ANGIO INTRA EXTRACRAN SEL COM CAROTID INNOMINATE BILAT MOD SED  02/22/2018  ? IR ANGIO VERTEBRAL SEL VERTEBRAL BILAT MOD SED  02/22/2018  ? IR GENERIC HISTORICAL  08/03/2016  ? IR RADIOLOGIST EVAL & MGMT 08/03/2016 [facility]  ? LAPAROSCOPIC GASTRIC SLEEVE RESECTION N/A 04/10/2016  ? Procedure: LAPAROSCOPIC GASTRIC SLEEVE RESECTION WITH UPPER ENDO;  Surgeon: Greer Pickerel, MD;  Location: [facility];  Service: General;  Laterality: N/A;  ? LEFT HEART CATHETERIZATION WITH CORONARY ANGIOGRAM N/A 09/28/2014  ? Procedure: LEFT HEART CATHETERIZATION WITH CORONARY ANGIOGRAM;  Surgeon: Jettie Booze, MD;  Location: [facility] CATH LAB;  Service: Cardiovascular;  Laterality: N/A;  ? RADIOLOGY WITH ANESTHESIA N/A 10/14/2014  ? Procedure: ANGIOPLASTY;  Surgeon: Luanne Bras, MD;  Location: [place];  Service: Radiology;  Laterality: N/A;  ? TUBAL LIGATION    ? ? ?Family History: ?Family History  ?Problem Relation Age of Onset  ? Heart failure Father   ? Heart disease Father   ? Heart attack Father   ?     61's  ? Kidney disease Mother   ?     dialysis for 26 years  ? Heart attack Mother   ?     47's  ? Stroke Paternal Grandmother   ? ? ?Social History: ?Social History  ? ?Tobacco Use  ?Smoking Status Former  ? Packs/day: 0.50  ? Years: 41.00  ? Pack years: 20.50  ? Types: Cigarettes  ? Start date: 07/25/1975  ? Quit date: 09/20/2021  ? Years since quitting: 0.0  ?Smokeless Tobacco Never  ? ?Social History  ? ?Substance and Sexual Activity  ?Alcohol Use Yes  ? Alcohol/week: 0.0 standard drinks  ? Comment: rare use   ? ?Social History  ? ?Substance and Sexual Activity  ?Drug Use  No  ? ? ?Allergies: ?Allergies  ?Allergen Reactions  ? Dilaudid [Hydromorphone Hcl] Nausea And Vomiting  ? Crab [Shellfish Allergy] Swelling  ? Other Other (See Comments)  ?  "pt is a difficult IV stick, prefers IV team paged for IV starts"  ? ? ?Medications: ?Current Outpatient Medications  ?Medication Sig Dispense Refill  ? acetaminophen (TYLENOL) 500 MG tablet Take 500-1,000 mg by mouth every 8 (eight) hours as needed for moderate pain or headache.     ? aspirin EC 81 MG tablet Take 81 mg by mouth every morning.    ? calcium citrate-vitamin D 500-400 MG-UNIT chewable tablet Chew 2 tablets by mouth 2 (two) times daily.    ? clopidogrel (PLAVIX) 75 MG tablet Take 75 mg by mouth every morning.    ? Cyanocobalamin (VITAMIN B-12) 5000 MCG SUBL Place 5,000 mcg under the tongue 2 (two)  times a week.     ? docusate sodium (COLACE) 100 MG capsule Take 100 mg by mouth daily.    ? felodipine (PLENDIL) 5 MG 24 hr tablet Take 5 mg by mouth daily.     ? furosemide (LASIX) 40 MG tablet Take 40 mg by mouth daily.    ? gabapentin (NEURONTIN) 300 MG capsule Take 300 mg by mouth at bedtime.   0  ? ibuprofen (ADVIL,MOTRIN) 800 MG tablet Take 1 tablet (800 mg total) by mouth every 8 (eight) hours as needed. 30 tablet 0  ? lamoTRIgine (LAMICTAL) 100 MG tablet Take 100 mg by mouth every morning.     ? levETIRAcetam (KEPPRA) 500 MG tablet Take 500 mg by mouth every 12 (twelve) hours.    ? LORazepam (ATIVAN) 1 MG tablet Take 1 mg by mouth daily as needed for anxiety.    ? metFORMIN (GLUCOPHAGE) 1000 MG tablet Take 1,000 mg by mouth 2 (two) times daily.    ? metoprolol succinate (TOPROL-XL) 25 MG 24 hr tablet Take 1 tablet by mouth daily 90 tablet 2  ? Multiple Vitamin (MULITIVITAMIN WITH MINERALS) TABS Take 1 tablet by mouth daily.     ? olmesartan (BENICAR) 40 MG tablet Take 40 mg by mouth every morning.     ? OZEMPIC, 1 MG/DOSE, 2 MG/1.5ML SOPN Inject 1 mg into the skin once a week.    ? pantoprazole (PROTONIX) 40 MG tablet Take 40 mg  by mouth daily.     ? pioglitazone (ACTOS) 15 MG tablet Take 15 mg by mouth every morning.     ? potassium chloride (KLOR-CON M) 10 MEQ tablet TAKE 2 TABLETS BY MOUTH DAILY. 180 tablet 3  ? rosuvasta

## 2021-10-14 ENCOUNTER — Other Ambulatory Visit (INDEPENDENT_AMBULATORY_CARE_PROVIDER_SITE_OTHER): Payer: Self-pay | Admitting: Gastroenterology

## 2021-10-17 LAB — CELIAC DISEASE PANEL
(tTG) Ab, IgA: <1 U/mL
(tTG) Ab, IgG: <1 U/mL
Gliadin IgA: 8.5 U/mL
Gliadin IgG: <1 U/mL
Immunoglobulin A: 157 mg/dL (ref 47–310)

## 2021-10-17 LAB — COMPREHENSIVE METABOLIC PANEL
AG Ratio: 1.4 (calc) (ref 1.0–2.5)
ALT: 9 U/L (ref 6–29)
AST: 13 U/L (ref 10–35)
Albumin: 4.2 g/dL (ref 3.6–5.1)
Alkaline phosphatase (APISO): 66 U/L (ref 37–153)
BUN/Creatinine Ratio: 29 (calc) — ABNORMAL HIGH (ref 6–22)
BUN: 27 mg/dL — ABNORMAL HIGH (ref 7–25)
CO2: 27 mmol/L (ref 20–32)
Calcium: 11.8 mg/dL — ABNORMAL HIGH (ref 8.6–10.4)
Chloride: 105 mmol/L (ref 98–110)
Creat: 0.94 mg/dL (ref 0.50–1.03)
Globulin: 3 g/dL (calc) (ref 1.9–3.7)
Glucose, Bld: 97 mg/dL (ref 65–99)
Potassium: 4.6 mmol/L (ref 3.5–5.3)
Sodium: 143 mmol/L (ref 135–146)
Total Bilirubin: 0.6 mg/dL (ref 0.2–1.2)
Total Protein: 7.2 g/dL (ref 6.1–8.1)

## 2021-10-17 LAB — CBC WITH DIFFERENTIAL/PLATELET
Absolute Monocytes: 476 cells/uL (ref 200–950)
Basophils Absolute: 49 cells/uL (ref 0–200)
Basophils Relative: 0.7 %
Eosinophils Absolute: 273 cells/uL (ref 15–500)
Eosinophils Relative: 3.9 %
HCT: 40.5 % (ref 35.0–45.0)
Hemoglobin: 13.2 g/dL (ref 11.7–15.5)
Lymphs Abs: 2086 cells/uL (ref 850–3900)
MCH: 30.8 pg (ref 27.0–33.0)
MCHC: 32.6 g/dL (ref 32.0–36.0)
MCV: 94.6 fL (ref 80.0–100.0)
MPV: 10.4 fL (ref 7.5–12.5)
Monocytes Relative: 6.8 %
Neutro Abs: 4116 cells/uL (ref 1500–7800)
Neutrophils Relative %: 58.8 %
Platelets: 186 10*3/uL (ref 140–400)
RBC: 4.28 10*6/uL (ref 3.80–5.10)
RDW: 11.2 % (ref 11.0–15.0)
Total Lymphocyte: 29.8 %
WBC: 7 10*3/uL (ref 3.8–10.8)

## 2021-10-17 LAB — C-REACTIVE PROTEIN: CRP: 0.7 mg/L (ref <8.0)

## 2021-10-22 LAB — CALPROTECTIN: Calprotectin: 80 mcg/g

## 2021-10-27 ENCOUNTER — Ambulatory Visit (HOSPITAL_BASED_OUTPATIENT_CLINIC_OR_DEPARTMENT_OTHER)
Admission: RE | Admit: 2021-10-27 | Discharge: 2021-10-27 | Disposition: A | Discharge status: Home / Self Care | Payer: BC Managed Care – PPO | Source: Ambulatory Visit | Attending: Gastroenterology | Admitting: Gastroenterology

## 2021-10-27 DIAGNOSIS — N200 Calculus of kidney: Secondary | ICD-10-CM | POA: Diagnosis not present

## 2021-10-27 DIAGNOSIS — R198 Other specified symptoms and signs involving the digestive system and abdomen: Secondary | ICD-10-CM | POA: Diagnosis not present

## 2021-10-27 DIAGNOSIS — N1330 Unspecified hydronephrosis: Secondary | ICD-10-CM | POA: Diagnosis not present

## 2021-10-27 DIAGNOSIS — R1010 Upper abdominal pain, unspecified: Secondary | ICD-10-CM | POA: Diagnosis not present

## 2021-10-27 DIAGNOSIS — N201 Calculus of ureter: Secondary | ICD-10-CM | POA: Diagnosis not present

## 2021-10-27 MED ORDER — IOHEXOL 300 MG/ML  SOLN
100.0000 mL | Freq: Once | INTRAMUSCULAR | Status: AC | PRN
Start: 2021-10-27 — End: 2021-10-27
  Administered 2021-10-27: 85 mL via INTRAVENOUS

## 2021-10-28 ENCOUNTER — Ambulatory Visit (HOSPITAL_COMMUNITY)
Admission: RE | Admit: 2021-10-28 | Discharge: 2021-10-28 | Disposition: A | Discharge status: Home / Self Care | Payer: BC Managed Care – PPO | Source: Ambulatory Visit | Attending: Family Medicine | Admitting: Family Medicine

## 2021-10-28 DIAGNOSIS — Z1231 Encounter for screening mammogram for malignant neoplasm of breast: Secondary | ICD-10-CM | POA: Diagnosis not present

## 2021-10-31 ENCOUNTER — Telehealth (INDEPENDENT_AMBULATORY_CARE_PROVIDER_SITE_OTHER): Payer: Self-pay

## 2021-10-31 ENCOUNTER — Other Ambulatory Visit (INDEPENDENT_AMBULATORY_CARE_PROVIDER_SITE_OTHER): Payer: Self-pay

## 2021-10-31 ENCOUNTER — Telehealth: Payer: Self-pay | Admitting: *Deleted

## 2021-10-31 ENCOUNTER — Other Ambulatory Visit (HOSPITAL_COMMUNITY): Payer: Self-pay | Admitting: Interventional Radiology

## 2021-10-31 ENCOUNTER — Encounter (INDEPENDENT_AMBULATORY_CARE_PROVIDER_SITE_OTHER): Payer: Self-pay

## 2021-10-31 DIAGNOSIS — Z8673 Personal history of transient ischemic attack (TIA), and cerebral infarction without residual deficits: Secondary | ICD-10-CM

## 2021-10-31 DIAGNOSIS — K529 Noninfective gastroenteritis and colitis, unspecified: Secondary | ICD-10-CM

## 2021-10-31 MED ORDER — NA SULFATE-K SULFATE-MG SULF 17.5-3.13-1.6 GM/177ML PO SOLN: 1.0000 | Freq: Once | ORAL | 0 refills | Status: AC

## 2021-10-31 MED ORDER — CLENPIQ 10-3.5-12 MG-GM -GM/160ML PO SOLN: 1.0000 | Freq: Once | ORAL | 0 refills | Status: DC

## 2021-10-31 NOTE — Telephone Encounter (Signed)
Jill [name], CMA  ?

## 2021-10-31 NOTE — Telephone Encounter (Signed)
Seth Friedlander Ann Deo Mehringer, CMA  ?

## 2021-10-31 NOTE — Telephone Encounter (Signed)
Dr. Harl Bowie, patient was recently evaluated by you on 10/04/21. Can she be cleared medically for her upcoming colonoscopy? ? ?I will route request to hold Plavix back to requesting provider to be directed to vascular surgery.  ? ?[name], NP-C ? ?  ?10/31/2021, 4:25 PM ?[place] ?[address], [address] ?Office [phone] Fax [phone] ? ?

## 2021-10-31 NOTE — Telephone Encounter (Signed)
? ?  Pre-operative Risk Assessment  ?  ?Patient Name: Jill [name]  ?DOB: Jul 16, 1966 ?MRN: [id number]  ? ?  ? ?Request for Surgical Clearance   ? ?Procedure:   COLONOSCOPY ? ?Date of Surgery:  Clearance 11/18/21                              ?   ?Surgeon:  DR. DANIEL CASTANEDA ?Surgeon's Group or Practice Name:  [facility] ?Phone number:  [phone] ?Fax number:  [phone] ?  ?Type of Clearance Requested:   ?- Medical  ?- Pharmacy:  Hold Clopidogrel (Plavix) x 5 DAYS PRIOR ?  ?Type of Anesthesia:  MAC ?  ?Additional requests/questions:   ? ?Signed, ?Julaine Hua   ?10/31/2021, 3:41 PM  ? ?

## 2021-11-01 ENCOUNTER — Encounter (INDEPENDENT_AMBULATORY_CARE_PROVIDER_SITE_OTHER): Payer: Self-pay

## 2021-11-08 ENCOUNTER — Encounter (INDEPENDENT_AMBULATORY_CARE_PROVIDER_SITE_OTHER): Payer: Self-pay

## 2021-11-08 NOTE — Telephone Encounter (Signed)
? ?  Patient Name: Jill [name]  ?DOB: 15-Aug-1965 ?MRN: [id number] ? ?Primary Cardiologist: Carlyle Dolly, MD ? ?Chart reviewed as part of pre-operative protocol coverage. Recent OV reviewed from 10/04/21. I reached out to patient for update on how she is doing. The patient affirms she has been doing well without any new cardiac symptoms. Therefore, based on ACC/AHA guidelines, the patient would be at acceptable risk for the planned procedure without further cardiovascular testing. The patient was advised that if she develops new symptoms prior to surgery to contact our office to arrange for a follow-up visit, and she verbalized understanding. I do not think she necessarily needs additional MD input at this time. ? ?Regarding Plavix, she is not on this for cardiac reasons and we do not prescribe this for her. Per Dr. Nelly Laurence note, "followed by vascular. History of left ICA angioplasty 09/2014 for which she remains on ASA and plavix for. from 09/2020 telephone neuro interventional Dr Estanislado Pandy had recommended lifelong DAPT. " The patient states she no longer follows with vascular but has been previously asked to reach out to Dr. Arlean Hopping office to discuss what follow-up she needed with them. She believes her PCP is prescribing her Plavix at this point so would recommend GI team reach out to them for review. ? ?Will route this bundled recommendation to requesting provider via Epic fax function. Please call with questions. ? ?Charlie Pitter, PA-C ?11/08/2021, 11:32 AM ? ? ?

## 2021-11-09 NOTE — Telephone Encounter (Signed)
Agree with PA Dunn ok to proceed with procedure from cardiac standpoint. Not on plavix by our team, would need to clear with interventional Dr Estanislado Pandy that its ok to come off for procedure ? ? ?Zandra Abts MD ?

## 2021-11-10 NOTE — Telephone Encounter (Signed)
Noted. Recs already faxed 4/18. Will remove from preop box. ?

## 2021-11-14 DIAGNOSIS — G4733 Obstructive sleep apnea (adult) (pediatric): Secondary | ICD-10-CM | POA: Diagnosis not present

## 2021-11-14 DIAGNOSIS — I10 Essential (primary) hypertension: Secondary | ICD-10-CM | POA: Diagnosis not present

## 2021-11-14 DIAGNOSIS — E785 Hyperlipidemia, unspecified: Secondary | ICD-10-CM | POA: Diagnosis not present

## 2021-11-14 DIAGNOSIS — E1169 Type 2 diabetes mellitus with other specified complication: Secondary | ICD-10-CM | POA: Diagnosis not present

## 2021-11-15 ENCOUNTER — Encounter: Payer: Self-pay | Admitting: Pulmonary Disease

## 2021-11-15 ENCOUNTER — Ambulatory Visit (INDEPENDENT_AMBULATORY_CARE_PROVIDER_SITE_OTHER): Payer: BC Managed Care – PPO | Admitting: Pulmonary Disease

## 2021-11-15 VITALS — BP 128/54 | HR 76 | Temp 98.4°F | Ht 66.0 in | Wt 205.0 lb

## 2021-11-15 DIAGNOSIS — I2510 Atherosclerotic heart disease of native coronary artery without angina pectoris: Secondary | ICD-10-CM

## 2021-11-15 DIAGNOSIS — Z9989 Dependence on other enabling machines and devices: Secondary | ICD-10-CM

## 2021-11-15 DIAGNOSIS — G4733 Obstructive sleep apnea (adult) (pediatric): Secondary | ICD-10-CM

## 2021-11-15 NOTE — Patient Instructions (Signed)
?  X Home sleep study ?Based on this, we will get you back on CPAP ?

## 2021-11-15 NOTE — Patient Instructions (Signed)
? ? ? ? ? ? Jill [name] ? 11/15/2021  ?  ? '@PREFPERIOPPHARMACY'$ @ ? ? Your procedure is scheduled on  11/18/2021. ? ? Report to [facility] at  1000  A.M. ? ? Call this number if you have problems the morning of surgery: ? [phone] ? ? Remember: ? Follow the diet and prep instructions given to you by the office. ? ?  Your last dose fo plavix should be on 11/12/2021. ? ?  DO NOT take any medications for diabetes the morinng of your procedure. ?  ? ? ? Take these medicines the morning of surgery with A SIP OF WATER  ? ?           amlodipine, felodipine, lamicat, keppra, ativan (if needed), metoprolol, protonix. ?  ? ? Do not wear jewelry, make-up or nail polish. ? Do not wear lotions, powders, or perfumes, or deodorant. ? Do not shave 48 hours prior to surgery.  Men may shave face and neck. ? Do not bring valuables to the hospital. ? [facility] is not responsible for any belongings or valuables. ? ?Contacts, dentures or bridgework may not be worn into surgery.  Leave your suitcase in the car.  After surgery it may be brought to your room. ? ?For patients admitted to the hospital, discharge time will be determined by your treatment team. ? ?Patients discharged the day of surgery will not be allowed to drive home and must have someone with them for 24 hours.  ? ? ?Special instructions:   DO NOT smoke tobacco or vape for 24 hours before your procedure. ? ?Please read over the following fact sheets that you were given. ?Anesthesia Post-op Instructions and Care and Recovery After Surgery ?  ? ? ? Colonoscopy, Adult, Care After ?The following information offers guidance on how to care for yourself after your procedure. Your health care provider may also give you more specific instructions. If you have problems or questions, contact your health care provider. ?What can I expect after the procedure? ?After the procedure, it is common to have: ?A small amount of blood in your stool for 24 hours after the procedure. ?Some  gas. ?Mild cramping or bloating of your abdomen. ?Follow these instructions at home: ?Eating and drinking ? ?Drink enough fluid to keep your urine pale yellow. ?Follow instructions from your health care provider about eating or drinking restrictions. ?Resume your normal diet as told by your health care provider. Avoid heavy or fried foods that are hard to digest. ?Activity ?Rest as told by your health care provider. ?Avoid sitting for a long time without moving. Get up to take short walks every 1-2 hours. This is important to improve blood flow and breathing. Ask for help if you feel weak or unsteady. ?Return to your normal activities as told by your health care provider. Ask your health care provider what activities are safe for you. ?Managing cramping and bloating ? ?Try walking around when you have cramps or feel bloated. ?If directed, apply heat to your abdomen as told by your health care provider. Use the heat source that your health care provider recommends, such as a moist heat pack or a heating pad. ?Place a towel between your skin and the heat source. ?Leave the heat on for 20-30 minutes. ?Remove the heat if your skin turns bright red. This is especially important if you are unable to feel pain, heat, or cold. You have a greater risk of getting burned. ?General instructions ?If you  were given a sedative during the procedure, it can affect you for several hours. Do not drive or operate machinery until your health care provider says that it is safe. ?For the first 24 hours after the procedure: ?Do not sign important documents. ?Do not drink alcohol. ?Do your regular daily activities at a slower pace than normal. ?Eat soft foods that are easy to digest. ?Take over-the-counter and prescription medicines only as told by your health care provider. ?Keep all follow-up visits. This is important. ?Contact a health care provider if: ?You have blood in your stool 2-3 days after the procedure. ?Get help right away  if: ?You have more than a small spotting of blood in your stool. ?You have large blood clots in your stool. ?You have swelling of your abdomen. ?You have nausea or vomiting. ?You have a fever. ?You have increasing pain in your abdomen that is not relieved with medicine. ?These symptoms may be an emergency. Get help right away. Call 911. ?Do not wait to see if the symptoms will go away. ?Do not drive yourself to the hospital. ?Summary ?After the procedure, it is common to have a small amount of blood in your stool. You may also have mild cramping and bloating of your abdomen. ?If you were given a sedative during the procedure, it can affect you for several hours. Do not drive or operate machinery until your health care provider says that it is safe. ?Get help right away if you have a lot of blood in your stool, nausea or vomiting, a fever, or increased pain in your abdomen. ?This information is not intended to replace advice given to you by your health care provider. Make sure you discuss any questions you have with your health care provider. ?Document Revised: 03/02/2021 Document Reviewed: 03/02/2021 ?Elsevier Patient Education ? [place]. ?Monitored Anesthesia Care, Care After ?This sheet gives you information about how to care for yourself after your procedure. Your health care provider may also give you more specific instructions. If you have problems or questions, contact your health care provider. ?What can I expect after the procedure? ?After the procedure, it is common to have: ?Tiredness. ?Forgetfulness about what happened after the procedure. ?Impaired judgment for important decisions. ?Nausea or vomiting. ?Some difficulty with balance. ?Follow these instructions at home: ?For the time period you were told by your health care provider: ? ?  ? ?Rest as needed. ?Do not participate in activities where you could fall or become injured. ?Do not drive or use machinery. ?Do not drink alcohol. ?Do not take  sleeping pills or medicines that cause drowsiness. ?Do not make important decisions or sign legal documents. ?Do not take care of children on your own. ?Eating and drinking ?Follow the diet that is recommended by your health care provider. ?Drink enough fluid to keep your urine pale yellow. ?If you vomit: ?Drink water, juice, or soup when you can drink without vomiting. ?Make sure you have little or no nausea before eating solid foods. ?General instructions ?Have a responsible adult stay with you for the time you are told. It is important to have someone help care for you until you are awake and alert. ?Take over-the-counter and prescription medicines only as told by your health care provider. ?If you have sleep apnea, surgery and certain medicines can increase your risk for breathing problems. Follow instructions from your health care provider about wearing your sleep device: ?Anytime you are sleeping, including during daytime naps. ?While taking prescription pain medicines, sleeping  medicines, or medicines that make you drowsy. ?Avoid smoking. ?Keep all follow-up visits as told by your health care provider. This is important. ?Contact a health care provider if: ?You keep feeling nauseous or you keep vomiting. ?You feel light-headed. ?You are still sleepy or having trouble with balance after 24 hours. ?You develop a rash. ?You have a fever. ?You have redness or swelling around the IV site. ?Get help right away if: ?You have trouble breathing. ?You have new-onset confusion at home. ?Summary ?For several hours after your procedure, you may feel tired. You may also be forgetful and have poor judgment. ?Have a responsible adult stay with you for the time you are told. It is important to have someone help care for you until you are awake and alert. ?Rest as told. Do not drive or operate machinery. Do not drink alcohol or take sleeping pills. ?Get help right away if you have trouble breathing, or if you suddenly become  confused. ?This information is not intended to replace advice given to you by your health care provider. Make sure you discuss any questions you have with your health care provider. ?Document Revised: 11/2

## 2021-11-15 NOTE — Progress Notes (Signed)
? ?Subjective:  ? ? Patient ID: Jill [name], female    DOB: 20-Aug-1965, 56 y.o.   MRN: [id number] ? ?HPI ? 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 ? ?PMH -CVA status post PCI for left carotid stenosis, peripheral arterial disease ?-HFpEF ?-Diarrhea being evaluated by GI, colonoscopy planned ?-Status post laparoscopic sleeve gastrectomy ? ?OSA was diagnosed in 2012, baseline sleep study is not available to me at the time of dictation, she was maintained on CPAP with good improvement in her's morning and daytime somnolence.  DME was [employer].  She obtained a second machine about 5 years ago.  Her husband works third shift and no bed partner history is available.  About 6 weeks ago she developed cough and congestion and felt that CPAP.  He was contributing and stopped using her machine.  She has not noticed any change in her somnolence or fatigue. ?Epworth sleepiness score is 8 and she reports sleepiness while sitting and reading, watching TV, as a passenger in a car. ?Bedtime is anywhere between 11 PM and 2 AM , TV stays on through the night, she sleeps on her side with 1 pillow, sleep latency is variable, she denies nocturnal leg awakenings and is out of bed at 8:30 AM feeling rested with dryness of mouth but denies headaches ?There is no history suggestive of cataplexy, sleep paralysis or parasomnias ? ?Her weight has mostly been unchanged ? ? ? ?Past Medical History:  ?Diagnosis Date  ? Anxiety   ? Back pain   ? complains when laying on hard flat surface  ? Chronic kidney disease   ? appt. /w urology 11/10/2014- for a cyst seen on [place]. evaluated was told it was nothing.  ? Diabetes mellitus   ? Diabetes II, oral and insulin.  ? DVT (deep venous thrombosis) ([place])   ? right leg '09 or '10  ? Fibroids   ? GERD (gastroesophageal reflux disease)   ? Heart murmur   ? History of stress test   ? referred for cardiac cath- done here at [facility]- 09/2014  ? Hypercholesterolemia   ? Hypertension    ? PONV (postoperative nausea and vomiting)   ? Seizures ([place])   ? 04/2010 x1- Dr. Colan Neptune x2 yearly.  ? Sleep apnea   ? CPAP q night , last study > 3 yrs. ago  ? Stroke [facility])   ? 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  ? ?Past Surgical History:  ?Procedure Laterality Date  ? APPENDECTOMY    ? CARDIAC CATHETERIZATION    ? [place]  ? x2  ? CHOLECYSTECTOMY    ? open"gallstones"  ? DILATATION & CURETTAGE/HYSTEROSCOPY WITH MYOSURE N/A 05/10/2017  ? Procedure: [place];  Surgeon: Servando Salina, MD;  Location: [facility];  Service: Gynecology;  Laterality: N/A;  ? INTERVENTIONAL RADIOLOGY PROCEDURE    ? CT angiography /neck -Dr. Estanislado Pandy.  ? IR ANGIO INTRA EXTRACRAN SEL COM CAROTID INNOMINATE BILAT MOD SED  02/22/2018  ? IR ANGIO VERTEBRAL SEL VERTEBRAL BILAT MOD SED  02/22/2018  ? IR GENERIC HISTORICAL  08/03/2016  ? IR RADIOLOGIST EVAL & MGMT 08/03/2016 [facility]-INTERV RAD  ? LAPAROSCOPIC GASTRIC SLEEVE RESECTION N/A 04/10/2016  ? Procedure: LAPAROSCOPIC GASTRIC SLEEVE RESECTION WITH UPPER ENDO;  Surgeon: Greer Pickerel, MD;  Location:  [facility];  Service: General;  Laterality: N/A;  ? LEFT HEART CATHETERIZATION WITH CORONARY ANGIOGRAM N/A 09/28/2014  ? Procedure: LEFT HEART CATHETERIZATION WITH CORONARY ANGIOGRAM;  Surgeon: Jettie Booze, MD;  Location: [facility] CATH LAB;  Service: Cardiovascular;  Laterality: N/A;  ? RADIOLOGY WITH ANESTHESIA N/A 10/14/2014  ? Procedure: ANGIOPLASTY;  Surgeon: Luanne Bras, MD;  Location: [facility];  Service: Radiology;  Laterality: N/A;  ? TUBAL LIGATION    ? ? ?Allergies  ?Allergen Reactions  ? Dilaudid [Hydromorphone Hcl] Nausea And Vomiting  ? Crab [Shellfish Allergy] Swelling  ? Other Other (See Comments)  ?  "pt is a difficult IV stick, prefers IV team  paged for IV starts"  ? ? ?Social History  ? ?Socioeconomic History  ? Marital status: Married  ?  Spouse name: Not on file  ? Number of children: Not on file  ? Years of education: Not on file  ? Highest education level: Not on file  ?Occupational History  ? Not on file  ?Tobacco Use  ? Smoking status: Former  ?  Packs/day: 0.50  ?  Years: 41.00  ?  Pack years: 20.50  ?  Types: Cigarettes  ?  Start date: 07/25/1975  ?  Quit date: 09/20/2021  ?  Years since quitting: 0.1  ? Smokeless tobacco: Never  ?Vaping Use  ? Vaping Use: Never used  ?Substance and Sexual Activity  ? Alcohol use: Yes  ?  Alcohol/week: 0.0 standard drinks  ?  Comment: rare use   ? Drug use: No  ? Sexual activity: Yes  ?  Birth control/protection: Other-see comments, Surgical  ?  Comment: Tubal Ligation  ?Other Topics Concern  ? Not on file  ?Social History Narrative  ? Not on file  ? ?Social Determinants of Health  ? ?Financial Resource Strain: Not on file  ?Food Insecurity: Not on file  ?Transportation Needs: Not on file  ?Physical Activity: Not on file  ?Stress: Not on file  ?Social Connections: Not on file  ?Intimate Partner Violence: Not on file  ? ? ?Family History  ?Problem Relation Age of Onset  ? Heart failure Father   ? Heart disease Father   ? Heart attack Father   ?     70's  ? Kidney disease Mother   ?     dialysis for 26 years  ? Heart attack Mother   ?     59's  ? Stroke Paternal Grandmother   ? ? ? ? ? ?Review of Systems ?Constitutional: negative for anorexia, fevers and sweats  ?Eyes: negative for irritation, redness and visual disturbance  ?Ears, nose, mouth, throat, and face: negative for earaches, epistaxis, nasal congestion and sore throat  ?Respiratory: negative for cough, dyspnea on exertion, sputum and wheezing  ?Cardiovascular: negative for chest pain, dyspnea, lower extremity edema, orthopnea, palpitations and syncope  ?Gastrointestinal: negative for abdominal pain, constipation, diarrhea, melena, nausea and vomiting   ?Genitourinary:negative for dysuria, frequency and hematuria  ?Hematologic/lymphatic: negative for bleeding, easy bruising and lymphadenopathy  ?Musculoskeletal:negative for arthralgias, muscle weakness and stiff joints  ?Neurological: negative for coordination problems, gait problems, headaches and weakness  ?Endocrine: negative for diabetic symptoms including polydipsia, polyuria and weight loss ? ?   ?Objective:  ? Physical Exam ? ?Gen. Pleasant, obese, in no distress, normal affect ?ENT - no pallor,icterus, no post nasal drip, class 2-3 airway ?Neck: No JVD, no thyromegaly, no carotid bruits ?Lungs: no use of accessory muscles, no dullness to percussion, decreased without rales  or rhonchi  ?Cardiovascular: Rhythm regular, heart sounds  normal, no murmurs or gallops, no peripheral edema ?Abdomen: soft and non-tender, no hepatosplenomegaly, BS normal. ?Musculoskeletal: No deformities, no cyanosis or clubbing ?Neuro:  alert, non focal, no tremors ? ? ? ?   ?Assessment & Plan:  ? ?Obesity -she seems to have lost weight and BMI is less than 35.  There is possible that OSA has reversed due to this degree of weight loss ?

## 2021-11-15 NOTE — Assessment & Plan Note (Signed)
She self discontinued CPAP therapy about 6 weeks ago.  Baseline sleep study is not available. ?We will reassess with a home sleep test.  She is undergoing evaluation for diarrhea and colonoscopy is planned.  It is okay to schedule home sleep test after. ?If she has significant OSA, we will resume CPAP therapy she would be interested in getting a new machine.  We discussed alternative therapies including oral appliance and hypoglossal nerve stimulator implant.  She tolerated CPAP therapy well and would likely resume that if needed ? ?Weight loss encouraged, compliance with goal of at least 4-6 hrs every night is the expectation. ?Advised against medications with sedative side effects ?Cautioned against driving when sleepy - understanding that sleepiness will vary on a day to day basis ? ?

## 2021-11-16 ENCOUNTER — Other Ambulatory Visit: Payer: Self-pay

## 2021-11-16 ENCOUNTER — Encounter (HOSPITAL_COMMUNITY)
Admission: RE | Admit: 2021-11-16 | Discharge: 2021-11-16 | Disposition: A | Discharge status: Home / Self Care | Payer: BC Managed Care – PPO | Source: Ambulatory Visit | Attending: Gastroenterology | Admitting: Gastroenterology

## 2021-11-16 ENCOUNTER — Encounter (HOSPITAL_COMMUNITY): Payer: Self-pay

## 2021-11-18 ENCOUNTER — Encounter (HOSPITAL_COMMUNITY)
Admission: RE | Disposition: A | Discharge status: Home / Self Care | Payer: Self-pay | Source: Home / Self Care | Attending: Gastroenterology

## 2021-11-18 ENCOUNTER — Ambulatory Visit (HOSPITAL_COMMUNITY)
Admission: RE | Admit: 2021-11-18 | Discharge: 2021-11-18 | Disposition: A | Discharge status: Home / Self Care | Payer: BC Managed Care – PPO | Attending: Gastroenterology | Admitting: Gastroenterology

## 2021-11-18 ENCOUNTER — Encounter (HOSPITAL_COMMUNITY): Payer: Self-pay | Admitting: Gastroenterology

## 2021-11-18 ENCOUNTER — Ambulatory Visit (HOSPITAL_COMMUNITY): Payer: BC Managed Care – PPO | Admitting: Anesthesiology

## 2021-11-18 ENCOUNTER — Other Ambulatory Visit: Payer: Self-pay

## 2021-11-18 DIAGNOSIS — Z9884 Bariatric surgery status: Secondary | ICD-10-CM | POA: Insufficient documentation

## 2021-11-18 DIAGNOSIS — D490 Neoplasm of unspecified behavior of digestive system: Secondary | ICD-10-CM | POA: Insufficient documentation

## 2021-11-18 DIAGNOSIS — I130 Hypertensive heart and chronic kidney disease with heart failure and stage 1 through stage 4 chronic kidney disease, or unspecified chronic kidney disease: Secondary | ICD-10-CM | POA: Diagnosis not present

## 2021-11-18 DIAGNOSIS — G4733 Obstructive sleep apnea (adult) (pediatric): Secondary | ICD-10-CM | POA: Diagnosis not present

## 2021-11-18 DIAGNOSIS — E7800 Pure hypercholesterolemia, unspecified: Secondary | ICD-10-CM | POA: Diagnosis not present

## 2021-11-18 DIAGNOSIS — G4730 Sleep apnea, unspecified: Secondary | ICD-10-CM | POA: Diagnosis not present

## 2021-11-18 DIAGNOSIS — I5032 Chronic diastolic (congestive) heart failure: Secondary | ICD-10-CM | POA: Insufficient documentation

## 2021-11-18 DIAGNOSIS — K6389 Other specified diseases of intestine: Secondary | ICD-10-CM | POA: Diagnosis not present

## 2021-11-18 DIAGNOSIS — K529 Noninfective gastroenteritis and colitis, unspecified: Secondary | ICD-10-CM | POA: Insufficient documentation

## 2021-11-18 DIAGNOSIS — R569 Unspecified convulsions: Secondary | ICD-10-CM | POA: Insufficient documentation

## 2021-11-18 DIAGNOSIS — I2510 Atherosclerotic heart disease of native coronary artery without angina pectoris: Secondary | ICD-10-CM | POA: Insufficient documentation

## 2021-11-18 DIAGNOSIS — K219 Gastro-esophageal reflux disease without esophagitis: Secondary | ICD-10-CM | POA: Diagnosis not present

## 2021-11-18 DIAGNOSIS — Z7984 Long term (current) use of oral hypoglycemic drugs: Secondary | ICD-10-CM | POA: Insufficient documentation

## 2021-11-18 DIAGNOSIS — Z87891 Personal history of nicotine dependence: Secondary | ICD-10-CM | POA: Diagnosis not present

## 2021-11-18 DIAGNOSIS — E669 Obesity, unspecified: Secondary | ICD-10-CM | POA: Insufficient documentation

## 2021-11-18 DIAGNOSIS — R109 Unspecified abdominal pain: Secondary | ICD-10-CM

## 2021-11-18 DIAGNOSIS — Z8673 Personal history of transient ischemic attack (TIA), and cerebral infarction without residual deficits: Secondary | ICD-10-CM | POA: Diagnosis not present

## 2021-11-18 DIAGNOSIS — N189 Chronic kidney disease, unspecified: Secondary | ICD-10-CM | POA: Diagnosis not present

## 2021-11-18 DIAGNOSIS — Z86718 Personal history of other venous thrombosis and embolism: Secondary | ICD-10-CM | POA: Insufficient documentation

## 2021-11-18 DIAGNOSIS — E1122 Type 2 diabetes mellitus with diabetic chronic kidney disease: Secondary | ICD-10-CM | POA: Insufficient documentation

## 2021-11-18 HISTORY — PX: BIOPSY: SHX5522

## 2021-11-18 HISTORY — PX: COLONOSCOPY WITH PROPOFOL: SHX5780

## 2021-11-18 LAB — GLUCOSE, CAPILLARY: Glucose-Capillary: 103 mg/dL — ABNORMAL HIGH (ref 70–99)

## 2021-11-18 LAB — HM COLONOSCOPY

## 2021-11-18 SURGERY — COLONOSCOPY WITH PROPOFOL
Anesthesia: General

## 2021-11-18 MED ORDER — PROPOFOL 10 MG/ML IV BOLUS
INTRAVENOUS | Status: DC | PRN
  Administered 2021-11-18: 30 mg via INTRAVENOUS
  Administered 2021-11-18: 50 mg via INTRAVENOUS

## 2021-11-18 MED ORDER — LIDOCAINE HCL (CARDIAC) PF 100 MG/5ML IV SOSY
PREFILLED_SYRINGE | INTRAVENOUS | Status: DC | PRN
  Administered 2021-11-18 (×2): 60 mg via INTRATRACHEAL

## 2021-11-18 MED ORDER — LACTATED RINGERS IV SOLN: INTRAVENOUS | Status: DC | PRN

## 2021-11-18 MED ORDER — PROPOFOL 500 MG/50ML IV EMUL
INTRAVENOUS | Status: DC | PRN
Start: 2021-11-18 — End: 2021-11-18
  Administered 2021-11-18: 100 ug/kg/min via INTRAVENOUS

## 2021-11-18 NOTE — Discharge Instructions (Addendum)
You are being discharged to home.  ?Resume your previous diet.  ?We are waiting for your pathology results.  ?Your physician has recommended a repeat colonoscopy in three years for screening purposes given prep quality. ?Restart Plavix today. ?

## 2021-11-18 NOTE — Op Note (Signed)
[facility] ?Patient Name: Jill [name] ?Procedure Date: 11/18/2021 12:02 PM ?MRN: [id number] ?Date of Birth: 1965-07-31 ?Attending MD: [name] ,  ?CSN: [id number] ?Age: 56 ?Admit Type: Outpatient ?Procedure:                Colonoscopy ?Indications:              Abdominal pain, Chronic diarrhea ?Providers:                [name], Lurline Del, RN, Raphael Gibney,  ?                          Technician ?Referring MD:              ?Medicines:                Monitored Anesthesia Care ?Complications:            No immediate complications. ?Estimated Blood Loss:     Estimated blood loss: none. ?Procedure:                Pre-Anesthesia Assessment: ?                          - Prior to the procedure, a History and Physical  ?                          was performed, and patient medications, allergies  ?                          and sensitivities were reviewed. The patient's  ?                          tolerance of previous anesthesia was reviewed. ?                          - The risks and benefits of the procedure and the  ?                          sedation options and risks were discussed with the  ?                          patient. All questions were answered and informed  ?                          consent was obtained. ?                          - ASA Grade Assessment: II - A patient with mild  ?                          systemic disease. ?                          After obtaining informed consent, the colonoscope  ?                          was passed under direct vision. Throughout the  ?  procedure, the patient's blood pressure, pulse, and  ?                          oxygen saturations were monitored continuously. The  ?                          PCF-HQ190L ([id number]) scope was introduced through  ?                          the anus and advanced to the the cecum, identified  ?                          by appendiceal orifice and ileocecal valve. The  ?                           colonoscopy was somewhat difficult due to  ?                          inadequate bowel prep. The patient tolerated the  ?                          procedure well. The quality of the bowel  ?                          preparation was inadequate. ?Scope In: 12:06:02 PM ?Scope Out: 12:34:42 PM ?Scope Withdrawal Time: 0 hours 14 minutes 29 seconds  ?Total Procedure Duration: 0 hours 28 minutes 40 seconds  ?Findings: ?     The perianal and digital rectal examinations were normal. ?     A non-obstructing medium-sized submucosal mass was found in the proximal  ?     ascending colon. It was mobile and not hard. No bleeding was present.  ?     This was biopsied with a cold forceps for histology on a "biopsy on  ?     biopsy" fashion. After biopsing, the mass was no longer present - most  ?     likely external compression. ?     The rest of the colon appeared normal. I attempted to do a thorough  ?     evaluation of the colon as thorough as possible but given bowel prep  ?     there are areas that could have small polyps or lesions. No large masses  ?     or overt bleeding was seen. Biopsies for histology were taken with a  ?     cold forceps from the right colon and left colon for evaluation of  ?     microscopic colitis. ?Impression:               - Preparation of the colon was inadequate. ?                          - Submucosal mass in the proximal ascending colon,  ?                          possibly external compression. Biopsied. ?                          -  The entire examined colon is normal. Biopsied. ?Moderate Sedation: ?     Per Anesthesia Care ?Recommendation:           - Discharge patient to home (ambulatory). ?                          - Resume previous diet. ?                          - Await pathology results. ?                          - Repeat colonoscopy in 3 years for screening  ?                          purposes given prep quality. ?                          -Restart Plavix today. ?Procedure Code(s):         --- Professional --- ?                          [phone], Colonoscopy, flexible; with biopsy, single  ?                          or multiple ?Diagnosis Code(s):        --- Professional --- ?                          D49.0, Neoplasm of unspecified behavior of  ?                          digestive system ?                          R10.9, Unspecified abdominal pain ?                          K52.9, Noninfective gastroenteritis and colitis,  ?                          unspecified ?CPT copyright 2019 American Medical Association. All rights reserved. ?The codes documented in this report are preliminary and upon coder review may  ?be revised to meet current compliance requirements. ?[name], MD ?[name],  ?11/18/2021 12:46:12 PM ?This report has been signed electronically. ?Number of Addenda: 0 ?

## 2021-11-18 NOTE — Anesthesia Preprocedure Evaluation (Signed)
Anesthesia Evaluation  ?Patient identified by MRN, date of birth, ID band ?Patient awake ? ? ? ?Reviewed: ?Allergy & Precautions, NPO status , Patient's Chart, lab work & pertinent test results, reviewed documented beta blocker date and time  ? ?History of Anesthesia Complications ?(+) PONV and history of anesthetic complications ? ?Airway ?Mallampati: II ? ?TM Distance: >3 FB ?Neck ROM: Full ? ? ? Dental ? ?(+) Dental Advisory Given, Teeth Intact ?  ?Pulmonary ?sleep apnea and Continuous Positive Airway Pressure Ventilation , former smoker,  ?  ?Pulmonary exam normal ?breath sounds clear to auscultation ? ? ? ? ? ? Cardiovascular ?Exercise Tolerance: Good ?hypertension, Pt. on medications and Pt. on home beta blockers ?Normal cardiovascular exam+ Valvular Problems/Murmurs (PFO)  ?Rhythm:Regular Rate:Normal ? ? ?  ?Neuro/Psych ?Seizures -, Well Controlled,  PSYCHIATRIC DISORDERS Anxiety CVA, Residual Symptoms   ? GI/Hepatic ?Neg liver ROS, GERD  Medicated and Controlled,  ?Endo/Other  ?diabetes, Well Controlled, Type 2, Oral Hypoglycemic Agents ? Renal/GU ?Renal disease  ?negative genitourinary ?  ?Musculoskeletal ?negative musculoskeletal ROS ?(+)  ? Abdominal ?  ?Peds ?negative pediatric ROS ?(+)  Hematology ?negative hematology ROS ?(+)   ?Anesthesia Other Findings ? ? Reproductive/Obstetrics ?negative OB ROS ? ?  ? ? ? ? ? ? ? ? ? ? ? ? ? ?  ?  ? ? ? ? ? ? ? ? ?Anesthesia Physical ?Anesthesia Plan ? ?ASA: 3 ? ?Anesthesia Plan: General  ? ?Post-op Pain Management: Minimal or no pain anticipated  ? ?Induction:  ? ?PONV Risk Score and Plan: Propofol infusion ? ?Airway Management Planned: Nasal Cannula and Natural Airway ? ?Additional Equipment:  ? ?Intra-op Plan:  ? ?Post-operative Plan:  ? ?Informed Consent: I have reviewed the patients History and Physical, chart, labs and discussed the procedure including the risks, benefits and alternatives for the proposed anesthesia with the  patient or authorized representative who has indicated his/her understanding and acceptance.  ? ? ? ?Dental advisory given ? ?Plan Discussed with: CRNA and Surgeon ? ?Anesthesia Plan Comments:   ? ? ? ? ? ? ?Anesthesia Quick Evaluation ? ?

## 2021-11-18 NOTE — H&P (Signed)
Jill [name] is an 56 y.o. female.   ?Chief Complaint: change in bowel movement frequency, rectal bleeding, abdominal pain. ?HPI: Jill [name] is a 56 y.o. female with past medical history of CKD, GERD, hyperlipidemia, hypertension, coronary artery disease status post cath showing mild to moderate nonobstructive disease, chronic diastolic heart failure, diabetes, DVT, sleep apnea, stroke, seizures, obesity status post sleeve gastrectomy who presents for evaluation of change in bowel movement frequency, rectal bleeding, abdominal pain. ? ?Patient has presented recurrent episodes of abdominal pain and persistent diarrhea With intermittent episodes of rectal bleeding.  Underwent a CT of the abdomen and pelvis with IV contrast which was completely normal for any abnormalities in the gastrointestinal tract.  She also underwent CMP, CRP, calprotectin, CBC and celiac diseas panel which were normal, with only presence of mild hypercalcemia. ? ?Past Medical History:  ?Diagnosis Date  ? Anxiety   ? Back pain   ? complains when laying on hard flat surface  ? Chronic kidney disease   ? appt. /w urology 11/10/2014- for a cyst seen on [place]. evaluated was told it was nothing.  ? Diabetes mellitus   ? Diabetes II, oral and insulin.  ? DVT (deep venous thrombosis) ([place])   ? right leg '09 or '10  ? Fibroids   ? GERD (gastroesophageal reflux disease)   ? Heart murmur   ? History of stress test   ? referred for cardiac cath- done here at [facility]- 09/2014  ? Hypercholesterolemia   ? Hypertension   ? PONV (postoperative nausea and vomiting)   ? Seizures ([place])   ? 04/2010 x1- Dr. Colan Neptune x2 yearly.  ? Sleep apnea   ? CPAP q night , last study > 3 yrs. ago  ? Stroke [facility])   ? 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  ? ? ?Past Surgical History:  ?Procedure  Laterality Date  ? APPENDECTOMY    ? CARDIAC CATHETERIZATION    ? [place]  ? x2  ? CHOLECYSTECTOMY    ? open"gallstones"  ? DILATATION & CURETTAGE/HYSTEROSCOPY WITH MYOSURE N/A 05/10/2017  ? Procedure: [place];  Surgeon: Servando Salina, MD;  Location: [facility];  Service: Gynecology;  Laterality: N/A;  ? INTERVENTIONAL RADIOLOGY PROCEDURE    ? CT angiography /neck -Dr. Estanislado Pandy.  ? IR ANGIO INTRA EXTRACRAN SEL COM CAROTID INNOMINATE BILAT MOD SED  02/22/2018  ? IR ANGIO VERTEBRAL SEL VERTEBRAL BILAT MOD SED  02/22/2018  ? IR GENERIC HISTORICAL  08/03/2016  ? IR RADIOLOGIST EVAL & MGMT 08/03/2016 [facility]-INTER[facility] RAD  ? LAPAROSCOPIC GASTRIC SLEEVE RESECTION N/A 04/10/2016  ? Procedure: LAPAROSCOPIC GASTRIC SLEEVE RESECTION WITH UPPER ENDO;  Surgeon: Greer Pickerel, MD;  Location: [facility];  Service: General;  Laterality: N/A;  ? LEFT HEART CATHETERIZATION WITH CORONARY ANGIOGRAM N/A 09/28/2014  ? Procedure: LEFT HEART CATHETERIZATION WITH CORONARY ANGIOGRAM;  Surgeon: Jettie Booze, MD;  Location: [facility] CATH LAB;  Service: Cardiovascular;  Laterality: N/A;  ? RADIOLOGY WITH ANESTHESIA N/A 10/14/2014  ? Procedure: ANGIOPLASTY;  Surgeon: Luanne Bras, MD;  Location: [place];  Service: Radiology;  Laterality: N/A;  ? TUBAL LIGATION    ? ? ?Family History  ?Problem Relation Age of Onset  ? Heart failure Father   ? Heart disease Father   ? Heart attack  Father   ?     43's  ? Kidney disease Mother   ?     dialysis for 26 years  ? Heart attack Mother   ?     77's  ? Stroke Paternal Grandmother   ? ?Social History:  reports that she quit smoking about 8 weeks ago. Her smoking use included cigarettes. She started smoking about 46 years ago. She has a 20.50 pack-year smoking history. She has never used smokeless tobacco. She reports current alcohol use. She reports that she does not use drugs. ? ?Allergies:  ?Allergies  ?Allergen Reactions  ? Dilaudid [Hydromorphone Hcl] Nausea  And Vomiting  ? Crab [Shellfish Allergy] Swelling  ? Other Other (See Comments)  ?  "pt is a difficult IV stick, prefers IV team paged for IV starts"  ? ? ?Medications Prior to Admission  ?Medication Sig Dispense Refill  ? acetaminophen (TYLENOL) 500 MG tablet Take 500-1,000 mg by mouth every 8 (eight) hours as needed for moderate pain or headache.     ? amLODipine (NORVASC) 5 MG tablet Take 5 mg by mouth daily.    ? aspirin EC 81 MG tablet Take 81 mg by mouth every morning.    ? calcium citrate-vitamin D 500-400 MG-UNIT chewable tablet Chew 2 tablets by mouth 2 (two) times daily.    ? clopidogrel (PLAVIX) 75 MG tablet Take 75 mg by mouth every morning.    ? Cyanocobalamin (VITAMIN B-12) 5000 MCG SUBL Place 5,000 mcg under the tongue 2 (two) times a week.     ? docusate sodium (COLACE) 100 MG capsule Take 100 mg by mouth daily as needed for moderate constipation.    ? felodipine (PLENDIL) 5 MG 24 hr tablet Take 5 mg by mouth daily.     ? furosemide (LASIX) 40 MG tablet Take 40 mg by mouth daily.    ? gabapentin (NEURONTIN) 300 MG capsule Take 300 mg by mouth at bedtime.   0  ? lamoTRIgine (LAMICTAL) 100 MG tablet Take 100 mg by mouth every morning.     ? levETIRAcetam (KEPPRA) 500 MG tablet Take 500 mg by mouth every 12 (twelve) hours.    ? LORazepam (ATIVAN) 1 MG tablet Take 1 mg by mouth daily as needed for anxiety.    ? metFORMIN (GLUCOPHAGE) 1000 MG tablet Take 1,000 mg by mouth 2 (two) times daily.    ? metoprolol succinate (TOPROL-XL) 25 MG 24 hr tablet Take 1 tablet by mouth daily 90 tablet 2  ? Multiple Vitamins-Minerals (BARIATRIC MULTIVITAMINS/IRON PO) Take 1 tablet by mouth daily.    ? olmesartan (BENICAR) 40 MG tablet Take 40 mg by mouth every morning.     ? OZEMPIC, 1 MG/DOSE, 2 MG/1.5ML SOPN Inject 1 mg into the skin every Wednesday.    ? pantoprazole (PROTONIX) 40 MG tablet Take 40 mg by mouth daily.     ? pioglitazone (ACTOS) 30 MG tablet Take 30 mg by mouth every morning.    ? potassium chloride  (KLOR-CON M) 10 MEQ tablet TAKE 2 TABLETS BY MOUTH DAILY. 180 tablet 3  ? rosuvastatin (CRESTOR) 40 MG tablet Take 1 tablet (40 mg total) by mouth at bedtime. 90 tablet 1  ? ibuprofen (ADVIL,MOTRIN) 800 MG tablet Take 1 tablet (800 mg total) by mouth every 8 (eight) hours as needed. (Patient not taking: Reported on 11/11/2021) 30 tablet 0  ? ? ?Results for orders placed or performed during the hospital encounter of 11/18/21 (from the past 48 hour(s))  ?  Glucose, capillary     Status: Abnormal  ? Collection Time: 11/18/21 10:50 AM  ?Result Value Ref Range  ? Glucose-Capillary 103 (H) 70 - 99 mg/dL  ?  Comment: Glucose reference range applies only to samples taken after fasting for at least 8 hours.  ? ?No results found. ? ?Review of Systems  ?Constitutional: Negative.   ?HENT: Negative.    ?Eyes: Negative.   ?Respiratory: Negative.    ?Cardiovascular: Negative.   ?Gastrointestinal:  Positive for abdominal pain, blood in stool and diarrhea.  ?Endocrine: Negative.   ?Genitourinary: Negative.   ?Musculoskeletal: Negative.   ?Skin: Negative.   ?Allergic/Immunologic: Negative.   ?Neurological: Negative.   ?Hematological: Negative.   ?Psychiatric/Behavioral: Negative.    ? ?Pulse 68, temperature 98.3 ?F (36.8 ?C), temperature source Oral, resp. rate 18, height '5\' 6"'$  (1.676 m), weight 92.9 kg, last menstrual period 12/10/2014, SpO2 98 %. ?Physical Exam  ?GENERAL: The patient is AO x3, in no acute distress. ?HEENT: Head is normocephalic and atraumatic. EOMI are intact. Mouth is well hydrated and without lesions. ?NECK: Supple. No masses ?LUNGS: Clear to auscultation. No presence of rhonchi/wheezing/rales. Adequate chest expansion ?HEART: RRR, normal s1 and s2. ?ABDOMEN: Soft, nontender, no guarding, no peritoneal signs, and nondistended. BS +. No masses. ?EXTREMITIES: Without any cyanosis, clubbing, rash, lesions or edema. ?NEUROLOGIC: AOx3, no focal motor deficit. ?SKIN: no jaundice, no rashes ? ?Assessment/Plan ?Jill [name] is a 56 y.o. female with past medical history of CKD, GERD, hyperlipidemia, hypertension, coronary artery disease status post cath showing mild to moderate nonobstructive disease, chronic diastolic heart fa

## 2021-11-18 NOTE — Transfer of Care (Signed)
Immediate Anesthesia Transfer of Care Note ? ?Patient: Jill [name] ? ?Procedure(s) Performed: COLONOSCOPY WITH PROPOFOL ?BIOPSY ? ?Patient Location: Short Stay ? ?Anesthesia Type:General ? ?Level of Consciousness: sedated ? ?Airway & Oxygen Therapy: Patient Spontanous Breathing ? ?Post-op Assessment: Report given to RN and Post -op Vital signs reviewed and stable ? ?Post vital signs: Reviewed and stable ? ?Last Vitals:  ?Vitals Value Taken Time  ?BP 128/69 11/18/21 1243  ?Temp 36.7 ?C 11/18/21 1243  ?Pulse    ?Resp 23 11/18/21 1243  ?SpO2 100 % 11/18/21 1243  ? ? ?Last Pain:  ?Vitals:  ? 11/18/21 1243  ?TempSrc: Oral  ?PainSc:   ?   ? ?Patients Stated Pain Goal: 5 (11/18/21 1058) ? ?Complications: No notable events documented. ?

## 2021-11-18 NOTE — Anesthesia Postprocedure Evaluation (Signed)
Anesthesia Post Note ? ?Patient: Jill [name] ? ?Procedure(s) Performed: COLONOSCOPY WITH PROPOFOL ?BIOPSY ? ?Patient location during evaluation: Phase II ?Anesthesia Type: General ?Level of consciousness: awake and alert and oriented ?Pain management: pain level controlled ?Vital Signs Assessment: post-procedure vital signs reviewed and stable ?Respiratory status: spontaneous breathing, nonlabored ventilation and respiratory function stable ?Cardiovascular status: blood pressure returned to baseline and stable ?Postop Assessment: no apparent nausea or vomiting ?Anesthetic complications: no ? ? ?No notable events documented. ? ? ?Last Vitals:  ?Vitals:  ? 11/18/21 1058 11/18/21 1243  ?BP:  128/69  ?Pulse: 68   ?Resp: 18 (!) 23  ?Temp: 36.8 ?C 36.7 ?C  ?SpO2: 98% 100%  ?  ?Last Pain:  ?Vitals:  ? 11/18/21 1243  ?TempSrc: Oral  ?PainSc:   ? ? ?  ?  ?  ?  ?  ?  ? ?Iola Turri C Christa Fasig ? ? ? ? ?

## 2021-11-21 ENCOUNTER — Encounter (INDEPENDENT_AMBULATORY_CARE_PROVIDER_SITE_OTHER): Payer: Self-pay | Admitting: *Deleted

## 2021-11-21 LAB — SURGICAL PATHOLOGY

## 2021-11-22 ENCOUNTER — Encounter: Payer: Self-pay | Admitting: Urology

## 2021-11-22 ENCOUNTER — Ambulatory Visit (INDEPENDENT_AMBULATORY_CARE_PROVIDER_SITE_OTHER): Payer: BC Managed Care – PPO | Admitting: Urology

## 2021-11-22 DIAGNOSIS — I2510 Atherosclerotic heart disease of native coronary artery without angina pectoris: Secondary | ICD-10-CM

## 2021-11-22 DIAGNOSIS — N201 Calculus of ureter: Secondary | ICD-10-CM

## 2021-11-22 DIAGNOSIS — N200 Calculus of kidney: Secondary | ICD-10-CM | POA: Diagnosis not present

## 2021-11-22 DIAGNOSIS — N1330 Unspecified hydronephrosis: Secondary | ICD-10-CM

## 2021-11-22 LAB — URINALYSIS, ROUTINE W REFLEX MICROSCOPIC
Bilirubin, UA: NEGATIVE
Glucose, UA: NEGATIVE
Ketones, UA: NEGATIVE
Nitrite, UA: NEGATIVE
Specific Gravity, UA: 1.02 (ref 1.005–1.030)
Urobilinogen, Ur: 0.2 mg/dL (ref 0.2–1.0)
pH, UA: 5.5 (ref 5.0–7.5)

## 2021-11-22 LAB — MICROSCOPIC EXAMINATION
Epithelial Cells (non renal): >10 /hpf — AB (ref 0–10)
Renal Epithel, UA: NONE SEEN /hpf
WBC, UA: >30 /hpf — AB (ref 0–5)

## 2021-11-22 NOTE — Progress Notes (Addendum)
H&P ? ?Chief Complaint: Kidney stones ? ?History of Present Illness: Jill [name] is a 56 y.o. year old female without prior urologic history comes in today for evaluation and management of bilateral urolithiasis.  She denies any prior history of kidney stones.  Her mother apparently did have kidney stones as well as renal failure/dialysis history. ? ?She recently had CT abdomen and pelvis for left sided abdominal pain.  This revealed a right proximal ureteral stone, right hydronephrosis, large stone burden in her right lower pole as well as larger left renal calculi. ? ?She denies frequent urinary tract infections or gross hematuria.  Currently no lower urinary tract symptoms.  She is diabetic. ? ?She has recently been undergoing evaluation for hypercalcemia.  She does not know that she has had a PTH level drawn. ? ?Past Medical History:  ?Diagnosis Date  ? Anxiety   ? Back pain   ? complains when laying on hard flat surface  ? Chronic kidney disease   ? appt. /w urology 11/10/2014- for a cyst seen on [place]. evaluated was told it was nothing.  ? Diabetes mellitus   ? Diabetes II, oral and insulin.  ? DVT (deep venous thrombosis) ([place])   ? right leg '09 or '10  ? Fibroids   ? GERD (gastroesophageal reflux disease)   ? Heart murmur   ? History of stress test   ? referred for cardiac cath- done here at [facility]- 09/2014  ? Hypercholesterolemia   ? Hypertension   ? PONV (postoperative nausea and vomiting)   ? Seizures ([place])   ? 04/2010 x1- Dr. Colan Neptune x2 yearly.  ? Sleep apnea   ? CPAP q night , last study > 3 yrs. ago  ? Stroke [facility])   ? 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  ? ? ?Past Surgical History:  ?Procedure Laterality Date  ? APPENDECTOMY    ? CARDIAC CATHETERIZATION    ? [place]  ? x2  ? CHOLECYSTECTOMY    ?  open"gallstones"  ? DILATATION & CURETTAGE/HYSTEROSCOPY WITH MYOSURE N/A 05/10/2017  ? Procedure: [place];  Surgeon: Servando Salina, MD;  Location: [facility];  Service: Gynecology;  Laterality: N/A;  ? INTERVENTIONAL RADIOLOGY PROCEDURE    ? CT angiography /neck -Dr. Estanislado Pandy.  ? IR ANGIO INTRA EXTRACRAN SEL COM CAROTID INNOMINATE BILAT MOD SED  02/22/2018  ? IR ANGIO VERTEBRAL SEL VERTEBRAL BILAT MOD SED  02/22/2018  ? IR GENERIC HISTORICAL  08/03/2016  ? IR RADIOLOGIST EVAL & MGMT 08/03/2016 [facility]  ? LAPAROSCOPIC GASTRIC SLEEVE RESECTION N/A 04/10/2016  ? Procedure: LAPAROSCOPIC GASTRIC SLEEVE RESECTION WITH UPPER ENDO;  Surgeon: Greer Pickerel, MD;  Location: [facility];  Service: General;  Laterality: N/A;  ? LEFT HEART CATHETERIZATION WITH CORONARY ANGIOGRAM N/A 09/28/2014  ? Procedure: LEFT HEART CATHETERIZATION WITH CORONARY ANGIOGRAM;  Surgeon: Jettie Booze, MD;  Location: [facility] CATH LAB;  Service: Cardiovascular;  Laterality: N/A;  ? RADIOLOGY WITH ANESTHESIA N/A 10/14/2014  ? Procedure: ANGIOPLASTY;  Surgeon: Luanne Bras, MD;  Location: [place];  Service: Radiology;  Laterality: N/A;  ? TUBAL LIGATION    ? ? ?Home Medications:  ?(Not in a hospital admission) ? ? ?Allergies:  ?Allergies  ?Allergen Reactions  ? Dilaudid [Hydromorphone Hcl] Nausea And Vomiting  ? Crab [  Shellfish Allergy] Swelling  ? Other Other (See Comments)  ?  "pt is a difficult IV stick, prefers IV team paged for IV starts"  ? ? ?Family History  ?Problem Relation Age of Onset  ? Heart failure Father   ? Heart disease Father   ? Heart attack Father   ?     2's  ? Kidney disease Mother   ?     dialysis for 26 years  ? Heart attack Mother   ?     1's  ? Stroke Paternal Grandmother   ? ? ?Social History:  reports that she quit smoking about 2 months ago. Her smoking use included cigarettes. She started smoking about 46 years ago. She has a 20.50 pack-year smoking history. She has never used  smokeless tobacco. She reports current alcohol use. She reports that she does not use drugs. ? ?ROS: ?A complete review of systems was performed.  All systems are negative except for pertinent findings as noted. ? ?Physical Exam:  ?Vital signs in last 24 hours: ?'@VSRANGES'$ @ ?General:  Alert and oriented, No acute distress ?HEENT: Normocephalic, atraumatic ?Neck: No JVD or lymphadenopathy ?Cardiovascular: Regular rate  ?Lungs: Normal inspiratory/expiratory excursion ?Extremities: No edema ?Neurologic: Grossly intact ? ?I have reviewed prior pt notes ? ?I have reviewed notes from referring/previous physicians ? ?I have reviewed urinalysis results ? ?I have independently reviewed prior imaging--I viewed CT images with the patient.  Dominant stone is in the left renal pelvis measuring 25 mm.  She has an 11 to 12 mm right upper ureteral stone with several right lower pole stones. ? ? ?Impression/Assessment:  ?Large bilateral renal stone burden with right upper ureteral stone.  She has significant hydro on that side.  Additionally, she has pyuria ? ?Plan:  ?1.  I will send urine for culture.  No antibiotic started yet ? ?2.  I had an extensive discussion with the patient explaining the fact that she needs bilateral percutaneous stone management.  I feel that this best done with one of our [facility]ist who can perform their own access ? ?3.  I will set up an appointment for consultation ? ?4.  I also discussed the fact that she may need more urgent stenting of the right stone but she is relatively asymptomatic now with normal renal function ? ?5.  Parathyroid hormone level drawn today. ? ?[name] Faraz Ponciano ?11/22/2021, 8:54 AM  ?[name]. [name] MD ? ? ?

## 2021-11-23 LAB — PTH, INTACT AND CALCIUM
Calcium: 11.2 mg/dL — ABNORMAL HIGH (ref 8.7–10.2)
PTH: 41 pg/mL (ref 15–65)

## 2021-11-24 LAB — URINE CULTURE

## 2021-11-25 ENCOUNTER — Other Ambulatory Visit (INDEPENDENT_AMBULATORY_CARE_PROVIDER_SITE_OTHER): Payer: Self-pay

## 2021-11-25 DIAGNOSIS — R1010 Upper abdominal pain, unspecified: Secondary | ICD-10-CM

## 2021-11-28 ENCOUNTER — Telehealth (INDEPENDENT_AMBULATORY_CARE_PROVIDER_SITE_OTHER): Payer: Self-pay

## 2021-11-28 ENCOUNTER — Encounter (INDEPENDENT_AMBULATORY_CARE_PROVIDER_SITE_OTHER): Payer: Self-pay

## 2021-11-28 MED ORDER — PEG 3350-KCL-NA BICARB-NACL 420 G PO SOLR: 4000.0000 mL | ORAL | 0 refills | Status: DC

## 2021-11-28 NOTE — Telephone Encounter (Signed)
Naasir Carreira Ann Katelyn Broadnax, CMA  ?

## 2021-12-02 ENCOUNTER — Encounter (INDEPENDENT_AMBULATORY_CARE_PROVIDER_SITE_OTHER): Payer: Self-pay

## 2021-12-02 ENCOUNTER — Telehealth (INDEPENDENT_AMBULATORY_CARE_PROVIDER_SITE_OTHER): Payer: Self-pay

## 2021-12-02 NOTE — Telephone Encounter (Signed)
Jill [name] Jill [name], CMA  ?

## 2021-12-05 ENCOUNTER — Encounter (HOSPITAL_COMMUNITY): Payer: Self-pay

## 2021-12-05 ENCOUNTER — Ambulatory Visit (HOSPITAL_COMMUNITY)
Admission: RE | Admit: 2021-12-05 | Discharge: 2021-12-05 | Disposition: A | Discharge status: Home / Self Care | Payer: BC Managed Care – PPO | Source: Ambulatory Visit | Attending: Interventional Radiology | Admitting: Interventional Radiology

## 2021-12-05 DIAGNOSIS — I6523 Occlusion and stenosis of bilateral carotid arteries: Secondary | ICD-10-CM | POA: Diagnosis not present

## 2021-12-05 DIAGNOSIS — I6601 Occlusion and stenosis of right middle cerebral artery: Secondary | ICD-10-CM | POA: Diagnosis not present

## 2021-12-05 DIAGNOSIS — I672 Cerebral atherosclerosis: Secondary | ICD-10-CM | POA: Diagnosis not present

## 2021-12-05 DIAGNOSIS — Z8673 Personal history of transient ischemic attack (TIA), and cerebral infarction without residual deficits: Secondary | ICD-10-CM | POA: Insufficient documentation

## 2021-12-05 LAB — POCT I-STAT CREATININE: Creatinine, Ser: 0.8 mg/dL (ref 0.44–1.00)

## 2021-12-05 MED ORDER — IOHEXOL 350 MG/ML SOLN
80.0000 mL | Freq: Once | INTRAVENOUS | Status: AC | PRN
  Administered 2021-12-05: 80 mL via INTRAVENOUS

## 2021-12-06 ENCOUNTER — Other Ambulatory Visit (HOSPITAL_COMMUNITY): Payer: Self-pay | Admitting: Physician Assistant

## 2021-12-06 DIAGNOSIS — I6529 Occlusion and stenosis of unspecified carotid artery: Secondary | ICD-10-CM

## 2021-12-09 DIAGNOSIS — N200 Calculus of kidney: Secondary | ICD-10-CM | POA: Diagnosis not present

## 2021-12-12 ENCOUNTER — Ambulatory Visit (INDEPENDENT_AMBULATORY_CARE_PROVIDER_SITE_OTHER): Payer: BC Managed Care – PPO | Admitting: Gastroenterology

## 2021-12-14 ENCOUNTER — Other Ambulatory Visit: Payer: Self-pay | Admitting: Urology

## 2021-12-21 ENCOUNTER — Other Ambulatory Visit: Payer: Self-pay | Admitting: Urology

## 2021-12-21 NOTE — Patient Instructions (Signed)
Jill [name]  12/21/2021     '@PREFPERIOPPHARMACY'$ @   Your procedure is scheduled on  12/27/2021.   Report to [facility] at  1130  A.M.   Call this number if you have problems the morning of surgery:  [phone]   Remember:  Follow the diet and prep instructions given to you by the office.    Your last dose of plavix should be on 12/21/2021.    DO NOT take any metformin the night before or the morning of your procedure.      Take these medicines the morning of surgery with A SIP OF WATER                 felodipine, lamictal, keppra, ativan, metoprolol.     Do not wear jewelry, make-up or nail polish.  Do not wear lotions, powders, or perfumes, or deodorant.  Do not shave 48 hours prior to surgery.  Men may shave face and neck.  Do not bring valuables to the hospital.  [facility] is not responsible for any belongings or valuables.  Contacts, dentures or bridgework may not be worn into surgery.  Leave your suitcase in the car.  After surgery it may be brought to your room.  For patients admitted to the hospital, discharge time will be determined by your treatment team.  Patients discharged the day of surgery will not be allowed to drive home and must have someone with them for 24 hours.    Special instructions:   DO NOT smoke tobacco or vape for 24 hours before your procedure.  Please read over the following fact sheets that you were given. Anesthesia Post-op Instructions and Care and Recovery After Surgery      Upper Endoscopy, Adult, Care After This sheet gives you information about how to care for yourself after your procedure. Your health care provider may also give you more specific instructions. If you have problems or questions, contact your health care provider. What can I expect after the procedure? After the procedure, it is common to have: A sore throat. Mild stomach pain or discomfort. Bloating. Nausea. Follow these instructions at  home:  Follow instructions from your health care provider about what to eat or drink after your procedure. Return to your normal activities as told by your health care provider. Ask your health care provider what activities are safe for you. Take over-the-counter and prescription medicines only as told by your health care provider. If you were given a sedative during the procedure, it can affect you for several hours. Do not drive or operate machinery until your health care provider says that it is safe. Keep all follow-up visits as told by your health care provider. This is important. Contact a health care provider if you have: A sore throat that lasts longer than one day. Trouble swallowing. Get help right away if: You vomit blood or your vomit looks like coffee grounds. You have: A fever. Bloody, black, or tarry stools. A severe sore throat or you cannot swallow. Difficulty breathing. Severe pain in your chest or abdomen. Summary After the procedure, it is common to have a sore throat, mild stomach discomfort, bloating, and nausea. If you were given a sedative during the procedure, it can affect you for several hours. Do not drive or operate machinery until your health care provider says that it is safe. Follow instructions from your health care provider about what to eat or drink after your procedure.  Return to your normal activities as told by your health care provider. This information is not intended to replace advice given to you by your health care provider. Make sure you discuss any questions you have with your health care provider. Document Revised: 05/16/2019 Document Reviewed: 12/10/2017 Elsevier Patient Education  [place] After This sheet gives you information about how to care for yourself after your procedure. Your health care provider may also give you more specific instructions. If you have problems or questions, contact your  health care provider. What can I expect after the procedure? After the procedure, it is common to have: Tiredness. Forgetfulness about what happened after the procedure. Impaired judgment for important decisions. Nausea or vomiting. Some difficulty with balance. Follow these instructions at home: For the time period you were told by your health care provider:     Rest as needed. Do not participate in activities where you could fall or become injured. Do not drive or use machinery. Do not drink alcohol. Do not take sleeping pills or medicines that cause drowsiness. Do not make important decisions or sign legal documents. Do not take care of children on your own. Eating and drinking Follow the diet that is recommended by your health care provider. Drink enough fluid to keep your urine pale yellow. If you vomit: Drink water, juice, or soup when you can drink without vomiting. Make sure you have little or no nausea before eating solid foods. General instructions Have a responsible adult stay with you for the time you are told. It is important to have someone help care for you until you are awake and alert. Take over-the-counter and prescription medicines only as told by your health care provider. If you have sleep apnea, surgery and certain medicines can increase your risk for breathing problems. Follow instructions from your health care provider about wearing your sleep device: Anytime you are sleeping, including during daytime naps. While taking prescription pain medicines, sleeping medicines, or medicines that make you drowsy. Avoid smoking. Keep all follow-up visits as told by your health care provider. This is important. Contact a health care provider if: You keep feeling nauseous or you keep vomiting. You feel light-headed. You are still sleepy or having trouble with balance after 24 hours. You develop a rash. You have a fever. You have redness or swelling around the IV  site. Get help right away if: You have trouble breathing. You have new-onset confusion at home. Summary For several hours after your procedure, you may feel tired. You may also be forgetful and have poor judgment. Have a responsible adult stay with you for the time you are told. It is important to have someone help care for you until you are awake and alert. Rest as told. Do not drive or operate machinery. Do not drink alcohol or take sleeping pills. Get help right away if you have trouble breathing, or if you suddenly become confused. This information is not intended to replace advice given to you by your health care provider. Make sure you discuss any questions you have with your health care provider. Document Revised: 06/14/2021 Document Reviewed: 06/12/2019 Elsevier Patient Education  [place].

## 2021-12-22 ENCOUNTER — Encounter (HOSPITAL_COMMUNITY)
Admission: RE | Admit: 2021-12-22 | Discharge: 2021-12-22 | Disposition: A | Discharge status: Home / Self Care | Payer: BC Managed Care – PPO | Source: Ambulatory Visit | Attending: Gastroenterology | Admitting: Gastroenterology

## 2021-12-22 DIAGNOSIS — Z01812 Encounter for preprocedural laboratory examination: Secondary | ICD-10-CM | POA: Diagnosis not present

## 2021-12-22 DIAGNOSIS — R1010 Upper abdominal pain, unspecified: Secondary | ICD-10-CM | POA: Insufficient documentation

## 2021-12-22 LAB — BASIC METABOLIC PANEL
Anion gap: 5 (ref 5–15)
BUN: 21 mg/dL — ABNORMAL HIGH (ref 6–20)
CO2: 29 mmol/L (ref 22–32)
Calcium: 10.9 mg/dL — ABNORMAL HIGH (ref 8.9–10.3)
Chloride: 106 mmol/L (ref 98–111)
Creatinine, Ser: 0.83 mg/dL (ref 0.44–1.00)
GFR, Estimated: >60 mL/min (ref >60)
Glucose, Bld: 89 mg/dL (ref 70–99)
Potassium: 3.6 mmol/L (ref 3.5–5.1)
Sodium: 140 mmol/L (ref 135–145)

## 2021-12-26 ENCOUNTER — Encounter (INDEPENDENT_AMBULATORY_CARE_PROVIDER_SITE_OTHER): Payer: Self-pay

## 2021-12-27 ENCOUNTER — Encounter (HOSPITAL_COMMUNITY): Payer: Self-pay | Admitting: Gastroenterology

## 2021-12-27 ENCOUNTER — Ambulatory Visit (HOSPITAL_COMMUNITY): Payer: BC Managed Care – PPO | Admitting: Anesthesiology

## 2021-12-27 ENCOUNTER — Other Ambulatory Visit: Payer: Self-pay

## 2021-12-27 ENCOUNTER — Ambulatory Visit (HOSPITAL_COMMUNITY)
Admission: RE | Admit: 2021-12-27 | Discharge: 2021-12-27 | Disposition: A | Discharge status: Home / Self Care | Payer: BC Managed Care – PPO | Attending: Gastroenterology | Admitting: Gastroenterology

## 2021-12-27 ENCOUNTER — Encounter (HOSPITAL_COMMUNITY)
Admission: RE | Disposition: A | Discharge status: Home / Self Care | Payer: Self-pay | Source: Home / Self Care | Attending: Gastroenterology

## 2021-12-27 DIAGNOSIS — N189 Chronic kidney disease, unspecified: Secondary | ICD-10-CM | POA: Diagnosis not present

## 2021-12-27 DIAGNOSIS — E669 Obesity, unspecified: Secondary | ICD-10-CM | POA: Insufficient documentation

## 2021-12-27 DIAGNOSIS — I2510 Atherosclerotic heart disease of native coronary artery without angina pectoris: Secondary | ICD-10-CM | POA: Insufficient documentation

## 2021-12-27 DIAGNOSIS — K2970 Gastritis, unspecified, without bleeding: Secondary | ICD-10-CM | POA: Diagnosis not present

## 2021-12-27 DIAGNOSIS — R1013 Epigastric pain: Secondary | ICD-10-CM | POA: Insufficient documentation

## 2021-12-27 DIAGNOSIS — E1122 Type 2 diabetes mellitus with diabetic chronic kidney disease: Secondary | ICD-10-CM | POA: Diagnosis not present

## 2021-12-27 DIAGNOSIS — G40909 Epilepsy, unspecified, not intractable, without status epilepticus: Secondary | ICD-10-CM | POA: Insufficient documentation

## 2021-12-27 DIAGNOSIS — K3189 Other diseases of stomach and duodenum: Secondary | ICD-10-CM | POA: Diagnosis not present

## 2021-12-27 DIAGNOSIS — G4730 Sleep apnea, unspecified: Secondary | ICD-10-CM | POA: Insufficient documentation

## 2021-12-27 DIAGNOSIS — I130 Hypertensive heart and chronic kidney disease with heart failure and stage 1 through stage 4 chronic kidney disease, or unspecified chronic kidney disease: Secondary | ICD-10-CM | POA: Diagnosis not present

## 2021-12-27 DIAGNOSIS — Z87891 Personal history of nicotine dependence: Secondary | ICD-10-CM | POA: Insufficient documentation

## 2021-12-27 DIAGNOSIS — K219 Gastro-esophageal reflux disease without esophagitis: Secondary | ICD-10-CM | POA: Insufficient documentation

## 2021-12-27 DIAGNOSIS — I5032 Chronic diastolic (congestive) heart failure: Secondary | ICD-10-CM | POA: Diagnosis not present

## 2021-12-27 DIAGNOSIS — Z9884 Bariatric surgery status: Secondary | ICD-10-CM

## 2021-12-27 DIAGNOSIS — F419 Anxiety disorder, unspecified: Secondary | ICD-10-CM | POA: Diagnosis not present

## 2021-12-27 DIAGNOSIS — Z86718 Personal history of other venous thrombosis and embolism: Secondary | ICD-10-CM | POA: Diagnosis not present

## 2021-12-27 DIAGNOSIS — R1010 Upper abdominal pain, unspecified: Secondary | ICD-10-CM

## 2021-12-27 HISTORY — PX: ESOPHAGOGASTRODUODENOSCOPY (EGD) WITH PROPOFOL: SHX5813

## 2021-12-27 HISTORY — PX: BIOPSY: SHX5522

## 2021-12-27 LAB — GLUCOSE, CAPILLARY
Glucose-Capillary: 93 mg/dL (ref 70–99)
Glucose-Capillary: 99 mg/dL (ref 70–99)

## 2021-12-27 SURGERY — ESOPHAGOGASTRODUODENOSCOPY (EGD) WITH PROPOFOL
Anesthesia: General

## 2021-12-27 MED ORDER — PROPOFOL 500 MG/50ML IV EMUL
INTRAVENOUS | Status: DC | PRN
  Administered 2021-12-27: 150 ug/kg/min via INTRAVENOUS

## 2021-12-27 MED ORDER — LACTATED RINGERS IV SOLN: INTRAVENOUS | Status: DC

## 2021-12-27 MED ORDER — PROPOFOL 10 MG/ML IV BOLUS
INTRAVENOUS | Status: DC | PRN
  Administered 2021-12-27: 100 mg via INTRAVENOUS

## 2021-12-27 MED ORDER — LIDOCAINE HCL (CARDIAC) PF 100 MG/5ML IV SOSY
PREFILLED_SYRINGE | INTRAVENOUS | Status: DC | PRN
  Administered 2021-12-27: 50 mg via INTRAVENOUS

## 2021-12-27 NOTE — Discharge Instructions (Signed)
You are being discharged to home.  Resume your previous diet.  We are waiting for your pathology results.  Continue your present medications.  Do not take any ibuprofen (including Advil, Motrin or Nuprin), naproxen, or other non-steroidal anti-inflammatory drugs.  

## 2021-12-27 NOTE — Anesthesia Preprocedure Evaluation (Signed)
Anesthesia Evaluation  Patient identified by MRN, date of birth, ID band Patient awake    Reviewed: Allergy & Precautions, H&P , NPO status , Patient's Chart, lab work & pertinent test results, reviewed documented beta blocker date and time   History of Anesthesia Complications (+) PONV and history of anesthetic complications  Airway Mallampati: II  TM Distance: >3 FB Neck ROM: full    Dental no notable dental hx.    Pulmonary sleep apnea , former smoker,    Pulmonary exam normal breath sounds clear to auscultation       Cardiovascular Exercise Tolerance: Good hypertension, negative cardio ROS   Rhythm:regular Rate:Normal     Neuro/Psych Seizures -, Well Controlled,  Anxiety CVA negative psych ROS   GI/Hepatic Neg liver ROS, GERD  Medicated,  Endo/Other  negative endocrine ROSdiabetes  Renal/GU negative Renal ROS  negative genitourinary   Musculoskeletal   Abdominal   Peds  Hematology negative hematology ROS (+)   Anesthesia Other Findings   Reproductive/Obstetrics negative OB ROS                             Anesthesia Physical Anesthesia Plan  ASA: 3  Anesthesia Plan: General   Post-op Pain Management:    Induction:   PONV Risk Score and Plan: Propofol infusion  Airway Management Planned:   Additional Equipment:   Intra-op Plan:   Post-operative Plan:   Informed Consent: I have reviewed the patients History and Physical, chart, labs and discussed the procedure including the risks, benefits and alternatives for the proposed anesthesia with the patient or authorized representative who has indicated his/her understanding and acceptance.     Dental Advisory Given  Plan Discussed with: CRNA  Anesthesia Plan Comments:         Anesthesia Quick Evaluation

## 2021-12-27 NOTE — Transfer of Care (Signed)
Immediate Anesthesia Transfer of Care Note  Patient: Jill [name]  Procedure(s) Performed: ESOPHAGOGASTRODUODENOSCOPY (EGD) WITH PROPOFOL BIOPSY  Patient Location: Short Stay  Anesthesia Type:General  Level of Consciousness: awake  Airway & Oxygen Therapy: Patient Spontanous Breathing  Post-op Assessment: Report given to RN and Post -op Vital signs reviewed and stable  Post vital signs: Reviewed and stable  Last Vitals:  Vitals Value Taken Time  BP    Temp    Pulse    Resp    SpO2      Last Pain:  Vitals:   12/27/21 1210  TempSrc: Oral  PainSc: 0-No pain         Complications: No notable events documented.

## 2021-12-27 NOTE — H&P (Signed)
Jill [name] is an 56 y.o. female.   Chief Complaint: Upper abdominal pain HPI: Jill [name] is a 56 y.o. female with past medical history of CKD, GERD, hyperlipidemia, hypertension, coronary artery disease status post cath showing mild to moderate nonobstructive disease, chronic diastolic heart failure, diabetes, DVT, sleep apnea, stroke, seizures, obesity status post sleeve gastrectomy, coming for evaluation of abdominal pain.  Patient presented recurrent episodes of abdominal pain since early 2023 which has actually improved in severity throughout the days.  Also the intensity of the pain is less severe.  Underwent a CT of the abdomen and pelvis with IV contrast that was unremarkable and she also had a colonoscopy that did not show any abnormalities.  Past Medical History:  Diagnosis Date   Anxiety    Back pain    complains when laying on hard flat surface   Chronic kidney disease    appt. /w urology 11/10/2014- for a cyst seen on [place]. evaluated was told it was nothing.   Diabetes mellitus    Diabetes II, oral and insulin.   DVT (deep venous thrombosis) (HCC)    right leg '09 or '10   Fibroids    GERD (gastroesophageal reflux disease)    Heart murmur    History of stress test    referred for cardiac cath- done here at [facility]- 09/2014   Hypercholesterolemia    Hypertension    PONV (postoperative nausea and vomiting)    Seizures ([place])    04/2010 x1- Dr. Colan Neptune x2 yearly.   Sleep apnea    CPAP q night , last study > 3 yrs. 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    Past Surgical History:  Procedure Laterality Date   APPENDECTOMY     BIOPSY  11/18/2021   Procedure: BIOPSY;  Surgeon: Harvel Quale, MD;  Location: [facility];  Service: Gastroenterology;;  random colon;    Teague   x2   CHOLECYSTECTOMY     open"gallstones"   COLONOSCOPY WITH PROPOFOL N/A 11/18/2021   Procedure: COLONOSCOPY WITH PROPOFOL;  Surgeon: Harvel Quale, MD;  Location: [facility];  Service: Gastroenterology;  Laterality: N/A;  [place] N/A 05/10/2017   Procedure: DILATATION & CURETTAGE/HYSTEROSCOPY WITH MYOSURE;  Surgeon: Servando Salina, MD;  Location: [facility];  Service: Gynecology;  Laterality: N/A;   INTERVENTIONAL RADIOLOGY PROCEDURE     CT angiography /neck -Dr. Estanislado Pandy.   IR ANGIO INTRA EXTRACRAN SEL COM CAROTID INNOMINATE BILAT MOD SED  02/22/2018   IR ANGIO VERTEBRAL SEL VERTEBRAL BILAT MOD SED  02/22/2018   IR GENERIC HISTORICAL  08/03/2016   IR RADIOLOGIST EVAL & MGMT 08/03/2016 [facility]-INTER[facility] RAD   LAPAROSCOPIC GASTRIC SLEEVE RESECTION N/A 04/10/2016   Procedure: LAPAROSCOPIC GASTRIC SLEEVE RESECTION WITH UPPER ENDO;  Surgeon: Greer Pickerel, MD;  Location: [facility];  Service: General;  Laterality: N/A;   LEFT HEART CATHETERIZATION WITH CORONARY ANGIOGRAM N/A 09/28/2014   Procedure: LEFT HEART CATHETERIZATION WITH CORONARY ANGIOGRAM;  Surgeon: Jettie Booze, MD;  Location: [facility] CATH LAB;  Service: Cardiovascular;  Laterality: N/A;   RADIOLOGY WITH ANESTHESIA N/A 10/14/2014   Procedure: ANGIOPLASTY;  Surgeon: Luanne Bras, MD;  Location: [facility];  Service: Radiology;  Laterality: N/A;   TUBAL LIGATION      Family History  Problem Relation Age of Onset   Heart failure Father    Heart disease Father    Heart attack Father        81's   Kidney disease Mother        dialysis for 17 years   Heart attack Mother        54's   Stroke Paternal Grandmother    Social History:  reports that she quit smoking about 3 months ago. Her smoking use included cigarettes. She started smoking about 46 years ago. She has a 20.50 pack-year smoking history. She has never used  smokeless tobacco. She reports current alcohol use. She reports that she does not use drugs.  Allergies:  Allergies  Allergen Reactions   Dilaudid [Hydromorphone Hcl] Nausea And Vomiting   Crab [Shellfish Allergy] Swelling   Other Other (See Comments)    "pt is a difficult IV stick, prefers IV team paged for IV starts"    Medications Prior to Admission  Medication Sig Dispense Refill   acetaminophen (TYLENOL) 500 MG tablet Take 500-1,000 mg by mouth every 8 (eight) hours as needed for moderate pain or headache.      aspirin EC 81 MG tablet Take 81 mg by mouth every morning.     bismuth subsalicylate (PEPTO BISMOL) 262 MG chewable tablet Chew 524 mg by mouth 3 (three) times daily as needed (stomach pain.).     Calcium-Phosphorus-Vitamin D (CITRACAL +D3 PO) Take 1 tablet by mouth in the morning and at bedtime.     clopidogrel (PLAVIX) 75 MG tablet Take 75 mg by mouth every morning.     Cyanocobalamin (VITAMIN B-12) 5000 MCG SUBL Place 5,000 mcg under the tongue every 3 (three) days.     docusate sodium (COLACE) 100 MG capsule Take 100 mg by mouth in the morning.     felodipine (PLENDIL) 5 MG 24 hr tablet Take 5 mg by mouth in the morning.     furosemide (LASIX) 40 MG tablet Take 20 mg by mouth in the morning and at bedtime.     gabapentin (NEURONTIN) 300 MG capsule Take 600 mg by mouth at bedtime.  0   GAVILYTE-G 236 g solution Take 4,000 mLs by mouth as directed.     ibuprofen (ADVIL,MOTRIN) 800 MG tablet Take 1 tablet (800 mg total) by mouth every 8 (eight) hours as needed. 30 tablet 0   lamoTRIgine (LAMICTAL) 100 MG tablet Take 100 mg by mouth every morning.      levETIRAcetam (KEPPRA) 500 MG tablet Take 250 mg by mouth every 12 (twelve) hours.     LORazepam (ATIVAN) 0.5 MG tablet Take 0.5 mg by mouth daily as needed for anxiety.     metFORMIN (GLUCOPHAGE) 1000 MG tablet Take 1,000 mg by mouth 2 (two) times daily.     metoprolol succinate (TOPROL-XL) 25 MG 24 hr tablet Take 1 tablet by  mouth daily 90 tablet 2   Multiple Vitamins-Minerals (BARIATRIC MULTIVITAMINS/IRON PO) Take 1 tablet by mouth daily.     olmesartan (BENICAR) 40 MG tablet Take 40 mg by mouth every morning.      OZEMPIC, 1 MG/DOSE, 2 MG/1.5ML SOPN Inject 1 mg into the skin every 14 (fourteen) days.     pantoprazole (PROTONIX) 40 MG tablet Take 40 mg by mouth daily before breakfast.     pioglitazone (ACTOS) 30 MG tablet Take 30 mg by mouth every morning.  potassium chloride (KLOR-CON M) 10 MEQ tablet TAKE 2 TABLETS BY MOUTH DAILY. 180 tablet 3   rosuvastatin (CRESTOR) 40 MG tablet Take 1 tablet (40 mg total) by mouth at bedtime. (Patient taking differently: Take 40 mg by mouth in the morning.) 90 tablet 1    Results for orders placed or performed during the hospital encounter of 12/27/21 (from the past 48 hour(s))  Glucose, capillary     Status: None   Collection Time: 12/27/21 12:07 PM  Result Value Ref Range   Glucose-Capillary 93 70 - 99 mg/dL    Comment: Glucose reference range applies only to samples taken after fasting for at least 8 hours.   No results found.  Review of Systems  Constitutional: Negative.   HENT: Negative.    Eyes: Negative.   Respiratory: Negative.    Cardiovascular: Negative.   Gastrointestinal:  Positive for abdominal pain.  Endocrine: Negative.   Genitourinary: Negative.   Musculoskeletal: Negative.   Skin: Negative.   Allergic/Immunologic: Negative.   Neurological: Negative.   Hematological: Negative.   Psychiatric/Behavioral: Negative.     Blood pressure 112/85, pulse 64, temperature 98.3 F (36.8 C), temperature source Oral, resp. rate 18, last menstrual period 12/10/2014, SpO2 100 %. Physical Exam  GENERAL: The patient is AO x3, in no acute distress. HEENT: Head is normocephalic and atraumatic. EOMI are intact. Mouth is well hydrated and without lesions. NECK: Supple. No masses LUNGS: Clear to auscultation. No presence of rhonchi/wheezing/rales. Adequate chest  expansion HEART: RRR, normal s1 and s2. ABDOMEN: Soft, nontender, no guarding, no peritoneal signs, and nondistended. BS +. No masses. EXTREMITIES: Without any cyanosis, clubbing, rash, lesions or edema. NEUROLOGIC: AOx3, no focal motor deficit. SKIN: no jaundice, no rashes  Assessment/Plan  Jill [name] is a 56 y.o. female with past medical history of CKD, GERD, hyperlipidemia, hypertension, coronary artery disease status post cath showing mild to moderate nonobstructive disease, chronic diastolic heart failure, diabetes, DVT, sleep apnea, stroke, seizures, obesity status post sleeve gastrectomy, coming for evaluation of abdominal pain.  We will proceed with EGD.  Harvel Quale, MD 12/27/2021, 1:03 PM

## 2021-12-27 NOTE — Op Note (Signed)
[facility] Patient Name: Jill [name] Procedure Date: 12/27/2021 2:17 PM MRN: [id number] Date of Birth: Sep 18, 1965 Attending MD: [name] ,  CSN: [id number] Age: 56 Admit Type: Outpatient Procedure:                Upper GI endoscopy Indications:              Epigastric abdominal pain Providers:                [name], Janeece Riggers, RN, Kristine L.                            Risa Grill, Technician Referring MD:              Medicines:                Monitored Anesthesia Care Complications:            No immediate complications. Estimated Blood Loss:     Estimated blood loss: none. Procedure:                Pre-Anesthesia Assessment:                           - Prior to the procedure, a History and Physical                            was performed, and patient medications, allergies                            and sensitivities were reviewed. The patient's                            tolerance of previous anesthesia was reviewed.                           - The risks and benefits of the procedure and the                            sedation options and risks were discussed with the                            patient. All questions were answered and informed                            consent was obtained.                           - ASA Grade Assessment: II - A patient with mild                            systemic disease.                           After obtaining informed consent, the endoscope was                            passed under direct vision. Throughout the  procedure, the patient's blood pressure, pulse, and                            oxygen saturations were monitored continuously. The                            [id number]-H190 ([id number]) scope was introduced through the                            mouth, and advanced to the second part of duodenum.                            The upper GI endoscopy was accomplished without                             difficulty. The patient tolerated the procedure                            well. Scope In: 2:25:01 PM Scope Out: 2:30:54 PM Total Procedure Duration: 0 hours 5 minutes 53 seconds  Findings:      The examined esophagus was normal.      A single localized 4 mm erosion with no stigmata of recent bleeding was       found in the gastric body.      Diffuse moderate inflammation characterized by congestion (edema) and       erythema was found in the gastric antrum. Biopsies were taken with a       cold forceps for Helicobacter pylori testing.      Evidence of a sleeve gastrectomy was found in the stomach. This was       characterized by healthy appearing mucosa.      Localized nodular mucosa was found in the duodenal bulb(localized in two       focal areas). Biopsies were taken with a cold forceps for histology.      The exam of the duodenum was otherwise normal. Impression:               - Normal esophagus.                           - Erosive gastropathy with no stigmata of recent                            bleeding.                           - Gastritis. Biopsied.                           - A sleeve gastrectomy was found, characterized by                            healthy appearing mucosa.                           - Nodular mucosa in the duodenal bulb (localized in  two focal areas). Biopsied. Moderate Sedation:      Per Anesthesia Care Recommendation:           - Discharge patient to home (ambulatory).                           - Resume previous diet.                           - Await pathology results.                           - Continue present medications.                           - No ibuprofen, naproxen, or other non-steroidal                            anti-inflammatory drugs. Procedure Code(s):        --- Professional ---                           [phone], Esophagogastroduodenoscopy, flexible,                            transoral; with  biopsy, single or multiple Diagnosis Code(s):        --- Professional ---                           K31.89, Other diseases of stomach and duodenum                           K29.70, Gastritis, unspecified, without bleeding                           Z98.84, Bariatric surgery status                           R10.13, Epigastric pain CPT copyright 2019 American Medical Association. All rights reserved. The codes documented in this report are preliminary and upon coder review may  be revised to meet current compliance requirements. [name], MD [name],  12/27/2021 2:36:42 PM This report has been signed electronically. Number of Addenda: 0

## 2021-12-27 NOTE — Anesthesia Procedure Notes (Signed)
Date/Time: 12/27/2021 2:25 PM Performed by: Orlie Dakin, CRNA Pre-anesthesia Checklist: Patient identified, Emergency Drugs available, Suction available and Patient being monitored Patient Re-evaluated:Patient Re-evaluated prior to induction Oxygen Delivery Method: Nasal cannula Induction Type: IV induction Placement Confirmation: positive ETCO2

## 2021-12-28 LAB — H. PYLORI ANTIBODY, IGG: H Pylori IgG: 0.1 Index Value (ref 0.00–0.79)

## 2021-12-29 LAB — SURGICAL PATHOLOGY

## 2021-12-29 NOTE — Anesthesia Postprocedure Evaluation (Signed)
Anesthesia Post Note  Patient: Jill [name]  Procedure(s) Performed: ESOPHAGOGASTRODUODENOSCOPY (EGD) WITH PROPOFOL BIOPSY  Patient location during evaluation: Phase II Anesthesia Type: General Level of consciousness: awake Pain management: pain level controlled Vital Signs Assessment: post-procedure vital signs reviewed and stable Respiratory status: spontaneous breathing and respiratory function stable Cardiovascular status: blood pressure returned to baseline and stable Postop Assessment: no headache and no apparent nausea or vomiting Anesthetic complications: no Comments: Late entry   No notable events documented.   Last Vitals:  Vitals:   12/27/21 1210 12/27/21 1434  BP: 112/85 (!) 133/50  Pulse: 64 71  Resp: 18 (!) 23  Temp: 36.8 C 37.1 C  SpO2: 100% 100%    Last Pain:  Vitals:   12/28/21 1005  TempSrc:   PainSc: 0-No pain                 Louann Sjogren

## 2022-01-02 ENCOUNTER — Ambulatory Visit (HOSPITAL_COMMUNITY)
Admission: RE | Admit: 2022-01-02 | Discharge: 2022-01-02 | Disposition: A | Discharge status: Home / Self Care | Payer: BC Managed Care – PPO | Source: Ambulatory Visit | Attending: Physician Assistant | Admitting: Physician Assistant

## 2022-01-02 ENCOUNTER — Other Ambulatory Visit (INDEPENDENT_AMBULATORY_CARE_PROVIDER_SITE_OTHER): Payer: Self-pay

## 2022-01-02 DIAGNOSIS — D132 Benign neoplasm of duodenum: Secondary | ICD-10-CM

## 2022-01-02 DIAGNOSIS — I6529 Occlusion and stenosis of unspecified carotid artery: Secondary | ICD-10-CM

## 2022-01-03 ENCOUNTER — Telehealth: Payer: Self-pay

## 2022-01-03 ENCOUNTER — Encounter (INDEPENDENT_AMBULATORY_CARE_PROVIDER_SITE_OTHER): Payer: Self-pay

## 2022-01-03 HISTORY — PX: IR RADIOLOGIST EVAL & MGMT: IMG5224

## 2022-01-03 NOTE — Telephone Encounter (Signed)
Primary Cardiologist:Branch, Roderic Palau, MD  Chart reviewed as part of pre-operative protocol coverage. Because of Jill [name]'s past medical history and time since last visit, he/she will require a virtual visit/telephone call in order to better assess preoperative cardiovascular risk.  Pre-op covering staff: - Please contact patient, obtain consent, and schedule appointment   Regarding Plavix, she is not on this for cardiac reasons and we do not prescribe this for her. Per Dr. Nelly Laurence note, "followed by vascular. History of left ICA angioplasty 09/2014 for which she remains on ASA and plavix for. from 09/2020 telephone neuro interventional Dr Estanislado Pandy had recommended lifelong DAPT." Patient reported at that time that her PCP is prescribing her Plavix at this point so would recommend GI team reach out to them for review.   [name], NP-C    01/03/2022, 1:14 PM [place] [address], [address] Office [phone] Fax [phone]

## 2022-01-03 NOTE — Telephone Encounter (Signed)
   Pre-operative Risk Assessment    Patient Name: Jill [name]  DOB: 16-Jun-1966 MRN: [id number]      Request for Surgical Clearance    Procedure:   EGD Upper Endoscopy   Date of Surgery:  Clearance 01/13/22                                 Surgeon:  Dr. [name] Surgeon's Group or Practice Name:  [name] Phone number:  [phone] Fax number:  [phone]   Type of Clearance Requested:   - Medical  - Pharmacy:  Hold Aspirin and Clopidogrel (Plavix) Held prior to surgery 5 days   Type of Anesthesia:  MAC   Additional requests/questions:    Signed, Chrissa Meetze   01/03/2022, 12:05 PM

## 2022-01-03 NOTE — Telephone Encounter (Signed)
Left message for patient to return call to schedule a telehealth pre-op appointment.

## 2022-01-04 ENCOUNTER — Encounter (HOSPITAL_COMMUNITY): Payer: Self-pay | Admitting: Gastroenterology

## 2022-01-05 ENCOUNTER — Telehealth: Payer: Self-pay | Admitting: *Deleted

## 2022-01-05 NOTE — Telephone Encounter (Signed)
Pt agreeable to plan of care for tele pre op appt 01/06/22 @ 2 pm. Med rec and consent are done.

## 2022-01-05 NOTE — Telephone Encounter (Signed)
Pt agreeable to plan of care for tele pre op appt 01/06/22 @ 2 pm. Med rec and consent are done.     Patient Consent for Virtual Visit        Jill [name] has provided verbal consent on 01/05/2022 for a virtual visit (video or telephone).   CONSENT FOR VIRTUAL VISIT FOR:  Jill [name]  By participating in this virtual visit I agree to the following:  I hereby voluntarily request, consent and authorize [facility] and its employed or contracted physicians, physician assistants, nurse practitioners or other licensed health care professionals (the Practitioner), to provide me with telemedicine health care services (the "Services") as deemed necessary by the treating Practitioner. I acknowledge and consent to receive the Services by the Practitioner via telemedicine. I understand that the telemedicine visit will involve communicating with the Practitioner through live audiovisual communication technology and the disclosure of certain medical information by electronic transmission. I acknowledge that I have been given the opportunity to request an in-person assessment or other available alternative prior to the telemedicine visit and am voluntarily participating in the telemedicine visit.  I understand that I have the right to withhold or withdraw my consent to the use of telemedicine in the course of my care at any time, without affecting my right to future care or treatment, and that the Practitioner or I may terminate the telemedicine visit at any time. I understand that I have the right to inspect all information obtained and/or recorded in the course of the telemedicine visit and may receive copies of available information for a reasonable fee.  I understand that some of the potential risks of receiving the Services via telemedicine include:  Delay or interruption in medical evaluation due to technological equipment failure or disruption; Information transmitted may not be sufficient (e.g.  poor resolution of images) to allow for appropriate medical decision making by the Practitioner; and/or  In rare instances, security protocols could fail, causing a breach of personal health information.  Furthermore, I acknowledge that it is my responsibility to provide information about my medical history, conditions and care that is complete and accurate to the best of my ability. I acknowledge that Practitioner's advice, recommendations, and/or decision may be based on factors not within their control, such as incomplete or inaccurate data provided by me or distortions of diagnostic images or specimens that may result from electronic transmissions. I understand that the practice of medicine is not an exact science and that Practitioner makes no warranties or guarantees regarding treatment outcomes. I acknowledge that a copy of this consent can be made available to me via my patient portal ([facility]), or I can request a printed copy by calling the office of [place].    I understand that my insurance will be billed for this visit.   I have read or had this consent read to me. I understand the contents of this consent, which adequately explains the benefits and risks of the Services being provided via telemedicine.  I have been provided ample opportunity to ask questions regarding this consent and the Services and have had my questions answered to my satisfaction. I give my informed consent for the services to be provided through the use of telemedicine in my medical care

## 2022-01-06 ENCOUNTER — Ambulatory Visit (INDEPENDENT_AMBULATORY_CARE_PROVIDER_SITE_OTHER): Payer: BC Managed Care – PPO | Admitting: General Practice

## 2022-01-06 ENCOUNTER — Ambulatory Visit: Payer: BC Managed Care – PPO

## 2022-01-06 DIAGNOSIS — Z01810 Encounter for preprocedural cardiovascular examination: Secondary | ICD-10-CM

## 2022-01-06 NOTE — Progress Notes (Signed)
Virtual Visit via Telephone Note   Because of Jill [name]'s co-morbid illnesses, she is at least at moderate risk for complications without adequate follow up.  This format is felt to be most appropriate for this patient at this time.  The patient did not have access to video technology/had technical difficulties with video requiring transitioning to audio format only (telephone).  All issues noted in this document were discussed and addressed.  No physical exam could be performed with this format.  Please refer to the patient's chart for her consent to telehealth for [facility].  Evaluation Performed:  Preoperative cardiovascular risk assessment _____________   Date:  01/06/2022   Patient ID:  Jill [name], DOB 25-Jun-1966, MRN [id number] Patient Location:  Home Provider location:   Office  Primary Care Provider:  Iona Beard, MD Primary Cardiologist:  Carlyle Dolly, MD  Chief Complaint / Patient Profile   56 y.o. y/o female with a h/o orthostatic dizziness, obesity, CAD, hyperlipidemia, HTN, PAD, carotid stenosis who is pending EGD and presents today for telephonic preoperative cardiovascular risk assessment.  Past Medical History    Past Medical History:  Diagnosis Date   Anxiety    Back pain    complains when laying on hard flat surface   Chronic kidney disease    appt. /w urology 11/10/2014- for a cyst seen on [place]. evaluated was told it was nothing.   Diabetes mellitus    Diabetes II, oral and insulin.   DVT (deep venous thrombosis) (HCC)    right leg '09 or '10   Fibroids    GERD (gastroesophageal reflux disease)    Heart murmur    History of stress test    referred for cardiac cath- done here at [facility]- 09/2014   Hypercholesterolemia    Hypertension    PONV (postoperative nausea and vomiting)    Seizures ([place])    04/2010 x1- Dr. Colan Neptune x2 yearly.   Sleep apnea    CPAP q night , last study > 3 yrs. ago   Stroke [facility])    '09-had blockage in  brain, too deep to operate"occ left lower extremity fatique, occ. 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   Past Surgical History:  Procedure Laterality Date   APPENDECTOMY     BIOPSY  11/18/2021   Procedure: BIOPSY;  Surgeon: Harvel Quale, MD;  [facility];  Service: Gastroenterology;;  random colon;   BIOPSY  12/27/2021   Procedure: BIOPSY;  Surgeon: Harvel Quale, MD;  [facility];  Service: Gastroenterology;;   [place]   x2   CHOLECYSTECTOMY     open"gallstones"   COLONOSCOPY WITH PROPOFOL N/A 11/18/2021   Procedure: COLONOSCOPY WITH PROPOFOL;  Surgeon: Harvel Quale, MD;  [facility];  Service: Gastroenterology;  Laterality: N/A;  [place] N/A 05/10/2017   Procedure: DILATATION & CURETTAGE/HYSTEROSCOPY WITH MYOSURE;  Surgeon: Servando Salina, MD;  Location: [facility];  Service: Gynecology;  Laterality: N/A;   ESOPHAGOGASTRODUODENOSCOPY (EGD) WITH PROPOFOL N/A 12/27/2021   Procedure: ESOPHAGOGASTRODUODENOSCOPY (EGD) WITH PROPOFOL;  Surgeon: Harvel Quale, MD;  [facility];  Service: Gastroenterology;  Laterality: N/A;  [place] angiography /neck -Dr. Estanislado Pandy.   IR ANGIO INTRA EXTRACRAN SEL COM  CAROTID INNOMINATE BILAT MOD SED  02/22/2018   IR ANGIO VERTEBRAL SEL VERTEBRAL BILAT MOD SED  02/22/2018   IR GENERIC HISTORICAL  08/03/2016   IR RADIOLOGIST EVAL & MGMT 08/03/2016 [facility]INTER[facility]   IR RADIOLOGIST EVAL & MGMT  01/03/2022   LAPAROSCOPIC GASTRIC SLEEVE RESECTION N/A 04/10/2016   Procedure: LAPAROSCOPIC GASTRIC SLEEVE RESECTION WITH UPPER ENDO;  Surgeon: Greer Pickerel, MD;  Location: [facility];  Service: General;  Laterality: N/A;   LEFT HEART CATHETERIZATION WITH CORONARY ANGIOGRAM  N/A 09/28/2014   Procedure: LEFT HEART CATHETERIZATION WITH CORONARY ANGIOGRAM;  Surgeon: Jettie Booze, MD;  Location: [facility] CATH LAB;  Service: Cardiovascular;  Laterality: N/A;   RADIOLOGY WITH ANESTHESIA N/A 10/14/2014   Procedure: ANGIOPLASTY;  Surgeon: Luanne Bras, MD;  Location: [place];  Service: Radiology;  Laterality: N/A;   TUBAL LIGATION      Allergies  Allergies  Allergen Reactions   Dilaudid [Hydromorphone Hcl] Nausea And Vomiting   Crab [Shellfish Allergy] Swelling   Other Other (See Comments)    "pt is a difficult IV stick, prefers IV team paged for IV starts"    History of Present Illness    Jill [name] is a 56 y.o. female who presents via audio/video conferencing for a telehealth visit today.  Pt was last seen in cardiology clinic on 10/04/2021 by Dr. Harl Bowie.  At that time Jill [name] was doing well .  The patient is now pending procedure as outlined above. Since her last visit, she remained stable from a cardiac standpoint.  Today she denies chest pain, shortness of breath, lower extremity edema, fatigue, palpitations, melena, hematuria, hemoptysis, diaphoresis, weakness, presyncope, syncope, orthopnea, and PND.    Home Medications    Prior to Admission medications   Medication Sig Start Date End Date Taking? Authorizing Provider  acetaminophen (TYLENOL) 500 MG tablet Take 500-1,000 mg by mouth every 8 (eight) hours as needed for moderate pain or headache.     [provider]  aspirin EC 81 MG tablet Take 81 mg by mouth every morning.    [provider]  bismuth subsalicylate (PEPTO BISMOL) 262 MG chewable tablet Chew 524 mg by mouth 3 (three) times daily as needed (stomach pain.).    [provider]  Calcium-Phosphorus-Vitamin D (CITRACAL +D3 PO) Take 1 tablet by mouth in the morning and at bedtime.    [provider]  clopidogrel (PLAVIX) 75 MG tablet Take 75 mg by mouth every morning.    [provider]   Cyanocobalamin (VITAMIN B-12) 5000 MCG SUBL Place 5,000 mcg under the tongue every 3 (three) days.    [provider]  docusate sodium (COLACE) 100 MG capsule Take 100 mg by mouth in the morning.    [provider]  felodipine (PLENDIL) 5 MG 24 hr tablet Take 5 mg by mouth in the morning.    [provider]  furosemide (LASIX) 40 MG tablet Take 40 mg by mouth daily.    [provider]  gabapentin (NEURONTIN) 300 MG capsule Take 300 mg by mouth 2 (two) times daily. 01/07/16   [provider]  ibuprofen (ADVIL,MOTRIN) 800 MG tablet Take 1 tablet (800 mg total) by mouth every 8 (eight) hours as needed. Patient not taking: Reported on 01/05/2022 05/10/17   Servando Salina, MD  lamoTRIgine (LAMICTAL) 100 MG tablet Take 100 mg by mouth every morning.     [provider]  levETIRAcetam (KEPPRA) 500 MG tablet Take 250 mg by mouth 2 (  two) times daily.    [provider]  LORazepam (ATIVAN) 0.5 MG tablet Take 0.5 mg by mouth daily as needed for anxiety.    [provider]  metFORMIN (GLUCOPHAGE) 1000 MG tablet Take 1,000 mg by mouth 2 (two) times daily. 02/26/20   [provider]  metoprolol succinate (TOPROL-XL) 25 MG 24 hr tablet Take 1 tablet by mouth daily 02/18/20   Imogene Burn, PA-C  Multiple Vitamins-Minerals (BARIATRIC MULTIVITAMINS/IRON PO) Take 1 tablet by mouth daily.    [provider]  olmesartan (BENICAR) 40 MG tablet Take 40 mg by mouth every morning.     [provider]  OZEMPIC, 1 MG/DOSE, 2 MG/1.5ML SOPN Inject 1 mg into the skin once a week. 12/18/19   [provider]  pantoprazole (PROTONIX) 40 MG tablet Take 40 mg by mouth daily before breakfast. 07/26/16   [provider]  pioglitazone (ACTOS) 30 MG tablet Take 30 mg by mouth every morning.    [provider]  potassium chloride (KLOR-CON M) 10 MEQ tablet TAKE 2 TABLETS BY MOUTH DAILY. 08/30/21   Satira Sark, MD  rosuvastatin (CRESTOR) 40 MG tablet Take 1 tablet (40 mg total) by mouth at bedtime. 12/15/19   Imogene Burn, PA-C  VITAMIN K PO Take 1 tablet by mouth 3 (three) times a week.    [provider]    Physical Exam    Vital Signs:  Jill [name] does not have vital signs available for review today.  Given telephonic nature of communication, physical exam is limited. AAOx3. NAD. Normal affect.  Speech and respirations are unlabored.  Accessory Clinical Findings    None  Assessment & Plan    1.  Preoperative Cardiovascular Risk Assessment: [facility]; Dr. Jenetta Downer fax number [phone]     Primary Cardiologist: Carlyle Dolly, MD  Chart reviewed as part of pre-operative protocol coverage. Given past medical history and time since last visit, based on ACC/AHA guidelines, Jill [name] would be at acceptable risk for the planned procedure without further cardiovascular testing.   Patient was advised that if she develops new symptoms prior to surgery to contact our office to arrange a follow-up appointment.  He verbalized understanding.  Her antiplatelet therapy is not prescribed by cardiology.  Recommendations for holding antiplatelet therapy will need to come from prescribing provider.     A copy of this note will be routed to requesting surgeon.  Time:   Today, I have spent 5 minutes with the patient with telehealth technology discussing medical history, symptoms, and management plan.  Prior to her phone evaluation I spent greater than 10 minutes reviewing her medications and past medical history.   Deberah Pelton, NP  01/06/2022, 7:58 AM

## 2022-01-09 ENCOUNTER — Encounter (INDEPENDENT_AMBULATORY_CARE_PROVIDER_SITE_OTHER): Payer: Self-pay

## 2022-01-09 NOTE — Patient Instructions (Signed)
Jill [name]  01/09/2022     '@PREFPERIOPPHARMACY'$ @   Your procedure is scheduled on  01/13/2022.   Report to [facility] at  1315 (1:15) P.M.   Call this number if you have problems the morning of surgery:  [phone]   Remember:  Follow the diet instructions given to you by the office.    Your last dose of plavix should have been on 6/17.    Your last dose of ozempic should have been on 6/15 or before.        DO NOT take any medications for diabetes the morning of your procedure.    Take these medicines the morning of surgery with A SIP OF WATER       neurontin, lamictal, keppra, metoprolol, protonix.     Do not wear jewelry, make-up or nail polish.  Do not wear lotions, powders, or perfumes, or deodorant.  Do not shave 48 hours prior to surgery.  Men may shave face and neck.  Do not bring valuables to the hospital.  [facility] is not responsible for any belongings or valuables.  Contacts, dentures or bridgework may not be worn into surgery.  Leave your suitcase in the car.  After surgery it may be brought to your room.  For patients admitted to the hospital, discharge time will be determined by your treatment team.  Patients discharged the day of surgery will not be allowed to drive home and must have someone with them for 24 hours.    Special instructions:   DO NOT smoke tobacco or vape for 24 hours before your procedure.  Please read over the following fact sheets that you were given. Anesthesia Post-op Instructions and Care and Recovery After Surgery      Upper Endoscopy, Adult, Care After This sheet gives you information about how to care for yourself after your procedure. Your health care provider may also give you more specific instructions. If you have problems or questions, contact your health care provider. What can I expect after the procedure? After the procedure, it is common to have: A sore throat. Mild stomach pain or  discomfort. Bloating. Nausea. Follow these instructions at home:  Follow instructions from your health care provider about what to eat or drink after your procedure. Return to your normal activities as told by your health care provider. Ask your health care provider what activities are safe for you. Take over-the-counter and prescription medicines only as told by your health care provider. If you were given a sedative during the procedure, it can affect you for several hours. Do not drive or operate machinery until your health care provider says that it is safe. Keep all follow-up visits as told by your health care provider. This is important. Contact a health care provider if you have: A sore throat that lasts longer than one day. Trouble swallowing. Get help right away if: You vomit blood or your vomit looks like coffee grounds. You have: A fever. Bloody, black, or tarry stools. A severe sore throat or you cannot swallow. Difficulty breathing. Severe pain in your chest or abdomen. Summary After the procedure, it is common to have a sore throat, mild stomach discomfort, bloating, and nausea. If you were given a sedative during the procedure, it can affect you for several hours. Do not drive or operate machinery until your health care provider says that it is safe. Follow instructions from your health care provider about what to eat or  drink after your procedure. Return to your normal activities as told by your health care provider. This information is not intended to replace advice given to you by your health care provider. Make sure you discuss any questions you have with your health care provider. Document Revised: 05/16/2019 Document Reviewed: 12/10/2017 Elsevier Patient Education  [place] After This sheet gives you information about how to care for yourself after your procedure. Your health care provider may also give you more specific  instructions. If you have problems or questions, contact your health care provider. What can I expect after the procedure? After the procedure, it is common to have: Tiredness. Forgetfulness about what happened after the procedure. Impaired judgment for important decisions. Nausea or vomiting. Some difficulty with balance. Follow these instructions at home: For the time period you were told by your health care provider:     Rest as needed. Do not participate in activities where you could fall or become injured. Do not drive or use machinery. Do not drink alcohol. Do not take sleeping pills or medicines that cause drowsiness. Do not make important decisions or sign legal documents. Do not take care of children on your own. Eating and drinking Follow the diet that is recommended by your health care provider. Drink enough fluid to keep your urine pale yellow. If you vomit: Drink water, juice, or soup when you can drink without vomiting. Make sure you have little or no nausea before eating solid foods. General instructions Have a responsible adult stay with you for the time you are told. It is important to have someone help care for you until you are awake and alert. Take over-the-counter and prescription medicines only as told by your health care provider. If you have sleep apnea, surgery and certain medicines can increase your risk for breathing problems. Follow instructions from your health care provider about wearing your sleep device: Anytime you are sleeping, including during daytime naps. While taking prescription pain medicines, sleeping medicines, or medicines that make you drowsy. Avoid smoking. Keep all follow-up visits as told by your health care provider. This is important. Contact a health care provider if: You keep feeling nauseous or you keep vomiting. You feel light-headed. You are still sleepy or having trouble with balance after 24 hours. You develop a rash. You  have a fever. You have redness or swelling around the IV site. Get help right away if: You have trouble breathing. You have new-onset confusion at home. Summary For several hours after your procedure, you may feel tired. You may also be forgetful and have poor judgment. Have a responsible adult stay with you for the time you are told. It is important to have someone help care for you until you are awake and alert. Rest as told. Do not drive or operate machinery. Do not drink alcohol or take sleeping pills. Get help right away if you have trouble breathing, or if you suddenly become confused. This information is not intended to replace advice given to you by your health care provider. Make sure you discuss any questions you have with your health care provider. Document Revised: 06/14/2021 Document Reviewed: 06/12/2019 Elsevier Patient Education  [place].

## 2022-01-10 ENCOUNTER — Ambulatory Visit (INDEPENDENT_AMBULATORY_CARE_PROVIDER_SITE_OTHER): Payer: BC Managed Care – PPO

## 2022-01-10 DIAGNOSIS — G4733 Obstructive sleep apnea (adult) (pediatric): Secondary | ICD-10-CM

## 2022-01-11 ENCOUNTER — Encounter (HOSPITAL_COMMUNITY)
Admission: RE | Admit: 2022-01-11 | Discharge: 2022-01-11 | Disposition: A | Discharge status: Home / Self Care | Payer: BC Managed Care – PPO | Source: Ambulatory Visit | Attending: Gastroenterology | Admitting: Gastroenterology

## 2022-01-12 ENCOUNTER — Ambulatory Visit (INDEPENDENT_AMBULATORY_CARE_PROVIDER_SITE_OTHER): Payer: BC Managed Care – PPO | Admitting: Gastroenterology

## 2022-01-12 ENCOUNTER — Encounter (INDEPENDENT_AMBULATORY_CARE_PROVIDER_SITE_OTHER): Payer: Self-pay | Admitting: Gastroenterology

## 2022-01-12 VITALS — BP 112/76 | HR 77 | Temp 98.1°F | Ht 66.0 in | Wt 211.6 lb

## 2022-01-12 DIAGNOSIS — R1010 Upper abdominal pain, unspecified: Secondary | ICD-10-CM | POA: Diagnosis not present

## 2022-01-12 DIAGNOSIS — K2970 Gastritis, unspecified, without bleeding: Secondary | ICD-10-CM | POA: Diagnosis not present

## 2022-01-12 NOTE — Patient Instructions (Signed)
Proceed with EGD tomorrow Continue pantoprazole 40 mg qday Follow up with urology regarding lithotripsy

## 2022-01-13 ENCOUNTER — Ambulatory Visit (HOSPITAL_COMMUNITY)
Admission: RE | Admit: 2022-01-13 | Discharge: 2022-01-13 | Disposition: A | Discharge status: Home / Self Care | Payer: BC Managed Care – PPO | Attending: Gastroenterology | Admitting: Gastroenterology

## 2022-01-13 ENCOUNTER — Ambulatory Visit (HOSPITAL_COMMUNITY): Payer: BC Managed Care – PPO | Admitting: Anesthesiology

## 2022-01-13 ENCOUNTER — Encounter (HOSPITAL_COMMUNITY)
Admission: RE | Disposition: A | Discharge status: Home / Self Care | Payer: Self-pay | Source: Home / Self Care | Attending: Gastroenterology

## 2022-01-13 DIAGNOSIS — D132 Benign neoplasm of duodenum: Secondary | ICD-10-CM

## 2022-01-13 DIAGNOSIS — Z79899 Other long term (current) drug therapy: Secondary | ICD-10-CM | POA: Insufficient documentation

## 2022-01-13 DIAGNOSIS — R9439 Abnormal result of other cardiovascular function study: Secondary | ICD-10-CM

## 2022-01-13 DIAGNOSIS — Z8673 Personal history of transient ischemic attack (TIA), and cerebral infarction without residual deficits: Secondary | ICD-10-CM

## 2022-01-13 DIAGNOSIS — R109 Unspecified abdominal pain: Secondary | ICD-10-CM

## 2022-01-13 DIAGNOSIS — G4730 Sleep apnea, unspecified: Secondary | ICD-10-CM | POA: Diagnosis not present

## 2022-01-13 DIAGNOSIS — K219 Gastro-esophageal reflux disease without esophagitis: Secondary | ICD-10-CM | POA: Insufficient documentation

## 2022-01-13 DIAGNOSIS — I10 Essential (primary) hypertension: Secondary | ICD-10-CM | POA: Insufficient documentation

## 2022-01-13 DIAGNOSIS — F419 Anxiety disorder, unspecified: Secondary | ICD-10-CM | POA: Insufficient documentation

## 2022-01-13 DIAGNOSIS — Z9884 Bariatric surgery status: Secondary | ICD-10-CM | POA: Insufficient documentation

## 2022-01-13 DIAGNOSIS — E781 Pure hyperglyceridemia: Secondary | ICD-10-CM

## 2022-01-13 DIAGNOSIS — I639 Cerebral infarction, unspecified: Secondary | ICD-10-CM

## 2022-01-13 DIAGNOSIS — I6930 Unspecified sequelae of cerebral infarction: Secondary | ICD-10-CM | POA: Insufficient documentation

## 2022-01-13 DIAGNOSIS — R198 Other specified symptoms and signs involving the digestive system and abdomen: Secondary | ICD-10-CM

## 2022-01-13 DIAGNOSIS — Z7984 Long term (current) use of oral hypoglycemic drugs: Secondary | ICD-10-CM | POA: Insufficient documentation

## 2022-01-13 DIAGNOSIS — K317 Polyp of stomach and duodenum: Secondary | ICD-10-CM | POA: Insufficient documentation

## 2022-01-13 DIAGNOSIS — K603 Anal fistula: Secondary | ICD-10-CM

## 2022-01-13 DIAGNOSIS — K2970 Gastritis, unspecified, without bleeding: Secondary | ICD-10-CM

## 2022-01-13 DIAGNOSIS — G4733 Obstructive sleep apnea (adult) (pediatric): Secondary | ICD-10-CM

## 2022-01-13 DIAGNOSIS — E119 Type 2 diabetes mellitus without complications: Secondary | ICD-10-CM | POA: Insufficient documentation

## 2022-01-13 DIAGNOSIS — I771 Stricture of artery: Secondary | ICD-10-CM

## 2022-01-13 DIAGNOSIS — I6529 Occlusion and stenosis of unspecified carotid artery: Secondary | ICD-10-CM

## 2022-01-13 DIAGNOSIS — Q2112 Patent foramen ovale: Secondary | ICD-10-CM

## 2022-01-13 DIAGNOSIS — E1169 Type 2 diabetes mellitus with other specified complication: Secondary | ICD-10-CM

## 2022-01-13 HISTORY — PX: POLYPECTOMY: SHX5525

## 2022-01-13 HISTORY — PX: ESOPHAGOGASTRODUODENOSCOPY (EGD) WITH PROPOFOL: SHX5813

## 2022-01-13 HISTORY — PX: HEMOSTASIS CLIP PLACEMENT: SHX6857

## 2022-01-13 LAB — GLUCOSE, CAPILLARY: Glucose-Capillary: 110 mg/dL — ABNORMAL HIGH (ref 70–99)

## 2022-01-13 SURGERY — ESOPHAGOGASTRODUODENOSCOPY (EGD) WITH PROPOFOL
Anesthesia: General

## 2022-01-13 MED ORDER — PROPOFOL 10 MG/ML IV BOLUS
INTRAVENOUS | Status: DC | PRN
  Administered 2022-01-13: 20 mg via INTRAVENOUS
  Administered 2022-01-13: 10 mg via INTRAVENOUS
  Administered 2022-01-13: 20 mg via INTRAVENOUS
  Administered 2022-01-13 (×2): 10 mg via INTRAVENOUS
  Administered 2022-01-13 (×2): 20 mg via INTRAVENOUS
  Administered 2022-01-13: 100 mg via INTRAVENOUS
  Administered 2022-01-13: 30 mg via INTRAVENOUS
  Administered 2022-01-13 (×2): 10 mg via INTRAVENOUS
  Administered 2022-01-13 (×3): 20 mg via INTRAVENOUS

## 2022-01-13 MED ORDER — STERILE WATER FOR IRRIGATION IR SOLN
Status: DC | PRN
  Administered 2022-01-13: 50 mL

## 2022-01-13 MED ORDER — PANTOPRAZOLE SODIUM 40 MG PO TBEC: 40.0000 mg | DELAYED_RELEASE_TABLET | Freq: Two times a day (BID) | ORAL | 2 refills | Status: DC

## 2022-01-13 MED ORDER — LIDOCAINE HCL (CARDIAC) PF 100 MG/5ML IV SOSY
PREFILLED_SYRINGE | INTRAVENOUS | Status: DC | PRN
  Administered 2022-01-13: 60 mg via INTRATRACHEAL

## 2022-01-13 MED ORDER — LACTATED RINGERS IV SOLN: INTRAVENOUS | Status: DC

## 2022-01-13 MED ORDER — GLUCAGON HCL RDNA (DIAGNOSTIC) 1 MG IJ SOLR
INTRAMUSCULAR | Status: DC | PRN
  Administered 2022-01-13: 1 mg via INTRAVENOUS

## 2022-01-13 MED ORDER — PROPOFOL 1000 MG/100ML IV EMUL
INTRAVENOUS | Status: AC
  Filled 2022-01-13: qty 200

## 2022-01-13 MED ORDER — PROPOFOL 10 MG/ML IV BOLUS
INTRAVENOUS | Status: AC
  Filled 2022-01-13: qty 20

## 2022-01-13 MED ORDER — EPINEPHRINE 1 MG/10ML IJ SOSY
PREFILLED_SYRINGE | INTRAMUSCULAR | Status: AC
  Filled 2022-01-13: qty 10

## 2022-01-13 MED ORDER — GLUCAGON HCL RDNA (DIAGNOSTIC) 1 MG IJ SOLR
INTRAMUSCULAR | Status: AC
  Filled 2022-01-13: qty 1

## 2022-01-13 MED ORDER — SODIUM CHLORIDE (PF) 0.9 % IJ SOLN
PREFILLED_SYRINGE | INTRAMUSCULAR | Status: DC | PRN
  Administered 2022-01-13: 10 mL

## 2022-01-13 MED ORDER — SODIUM CHLORIDE FLUSH 0.9 % IV SOLN
INTRAVENOUS | Status: AC
  Filled 2022-01-13: qty 10

## 2022-01-13 NOTE — Transfer of Care (Signed)
Immediate Anesthesia Transfer of Care Note  Patient: Jill [name]  Procedure(s) Performed: ESOPHAGOGASTRODUODENOSCOPY (EGD) WITH PROPOFOL POLYPECTOMY HEMOSTASIS CLIP PLACEMENT  Patient Location: Short Stay  Anesthesia Type:General  Level of Consciousness: awake, alert  and oriented  Airway & Oxygen Therapy: Patient Spontanous Breathing  Post-op Assessment: Report given to RN and Post -op Vital signs reviewed and stable  Post vital signs: Reviewed and stable  Last Vitals:  Vitals Value Taken Time  BP    Temp    Pulse    Resp    SpO2      Last Pain:  Vitals:   01/13/22 0740  TempSrc:   PainSc: 5       Patients Stated Pain Goal: 6 (01/13/22 0640)  Complications: No notable events documented.

## 2022-01-16 ENCOUNTER — Other Ambulatory Visit (INDEPENDENT_AMBULATORY_CARE_PROVIDER_SITE_OTHER): Payer: Self-pay | Admitting: Gastroenterology

## 2022-01-16 DIAGNOSIS — R1010 Upper abdominal pain, unspecified: Secondary | ICD-10-CM

## 2022-01-16 LAB — SURGICAL PATHOLOGY

## 2022-01-16 MED ORDER — PANTOPRAZOLE SODIUM 40 MG PO TBEC: 40.0000 mg | DELAYED_RELEASE_TABLET | Freq: Two times a day (BID) | ORAL | 2 refills | Status: AC

## 2022-01-17 NOTE — Progress Notes (Addendum)
COVID Vaccine Completed:  Yes  Date of COVID positive in last 90 days:  No  PCP - Iona Beard, MD Cardiologist - Carlyle Dolly, MD [place], MD  Chest x-ray - N/A EKG - 10-04-21 Epic Stress Test - greater than 2 years Epic ECHO - greater than 2 years Epic Cardiac Cath - greater than 2 years Pacemaker/ICD device last checked: Spinal Cord Stimulator:  N/A  Bowel Prep - N/A  Sleep Study - Yes, +sleep apnea CPAP - Stopped using two months ago  Fasting Blood Sugar - 120 range Checks Blood Sugar - once a week   Blood Thinner Instructions: Plavix.  Pt to call and check to see when she can stop. Spoke to patient 01-27-22 and she states that she left a message regarding Plavix but has not heard back.  She will call again today for instructions Aspirin Instructions:  ASA 81  Last Dose:  Activity level:   Can go up a flight of stairs and perform activities of daily living without stopping and without symptoms of chest pain or shortness of breath.  Anesthesia review:  CAD, PAD, PFO (on TEE 2009), carotid stenosis, HTN, OSA, DM.  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  Hx of seizures (one in lifetime). Hx of DVT.   Patient denies shortness of breath, fever, cough and chest pain at PAT appointment  Patient verbalized understanding of instructions that were given to them at the PAT appointment. Patient was also instructed that they will need to review over the PAT instructions again at home before surgery.

## 2022-01-18 ENCOUNTER — Telehealth: Payer: Self-pay | Admitting: Pulmonary Disease

## 2022-01-18 NOTE — Telephone Encounter (Signed)
HST showed very mild  OSA with AHI 6/ hr  Only 2 h of sleep recorded  Recommendations: Please consider repeating this study due to inadequate recording time

## 2022-01-18 NOTE — Telephone Encounter (Signed)
Called and spoke to patient and she states that now is not a good time for her to repeat the study. She states she has surgeries and appointments going on over the next couple of months and would prefer to call back when she is able to repeat the HST.   Routing to [facility] as an [place].

## 2022-01-20 ENCOUNTER — Encounter (HOSPITAL_COMMUNITY): Payer: Self-pay | Admitting: Gastroenterology

## 2022-01-23 ENCOUNTER — Other Ambulatory Visit: Payer: Self-pay

## 2022-01-23 ENCOUNTER — Encounter (HOSPITAL_COMMUNITY)
Admission: RE | Admit: 2022-01-23 | Discharge: 2022-01-23 | Disposition: A | Discharge status: Home / Self Care | Payer: BC Managed Care – PPO | Source: Ambulatory Visit | Attending: Urology | Admitting: Urology

## 2022-01-23 ENCOUNTER — Encounter (HOSPITAL_COMMUNITY): Payer: Self-pay

## 2022-01-23 DIAGNOSIS — E119 Type 2 diabetes mellitus without complications: Secondary | ICD-10-CM | POA: Diagnosis not present

## 2022-01-23 DIAGNOSIS — Z01812 Encounter for preprocedural laboratory examination: Secondary | ICD-10-CM | POA: Insufficient documentation

## 2022-01-23 DIAGNOSIS — Z1389 Encounter for screening for other disorder: Secondary | ICD-10-CM | POA: Insufficient documentation

## 2022-01-23 HISTORY — DX: Personal history of urinary calculi: Z87.442

## 2022-01-23 LAB — COMPREHENSIVE METABOLIC PANEL
ALT: 14 U/L (ref 0–44)
AST: 18 U/L (ref 15–41)
Albumin: 3.9 g/dL (ref 3.5–5.0)
Alkaline Phosphatase: 72 U/L (ref 38–126)
Anion gap: 7 (ref 5–15)
BUN: 15 mg/dL (ref 6–20)
CO2: 27 mmol/L (ref 22–32)
Calcium: 10.9 mg/dL — ABNORMAL HIGH (ref 8.9–10.3)
Chloride: 108 mmol/L (ref 98–111)
Creatinine, Ser: 0.83 mg/dL (ref 0.44–1.00)
GFR, Estimated: >60 mL/min (ref >60)
Glucose, Bld: 102 mg/dL — ABNORMAL HIGH (ref 70–99)
Potassium: 4.1 mmol/L (ref 3.5–5.1)
Sodium: 142 mmol/L (ref 135–145)
Total Bilirubin: 0.8 mg/dL (ref 0.3–1.2)
Total Protein: 7.6 g/dL (ref 6.5–8.1)

## 2022-01-23 LAB — CBC
HCT: 44 % (ref 36.0–46.0)
Hemoglobin: 14.2 g/dL (ref 12.0–15.0)
MCH: 31.3 pg (ref 26.0–34.0)
MCHC: 32.3 g/dL (ref 30.0–36.0)
MCV: 97.1 fL (ref 80.0–100.0)
Platelets: 196 10*3/uL (ref 150–400)
RBC: 4.53 MIL/uL (ref 3.87–5.11)
RDW: 12.2 % (ref 11.5–15.5)
WBC: 8.5 10*3/uL (ref 4.0–10.5)
nRBC: 0 % (ref 0.0–0.2)

## 2022-01-23 LAB — HEMOGLOBIN A1C
Hgb A1c MFr Bld: 5.4 % (ref 4.8–5.6)
Mean Plasma Glucose: 108.28 mg/dL

## 2022-01-23 LAB — GLUCOSE, CAPILLARY: Glucose-Capillary: 131 mg/dL — ABNORMAL HIGH (ref 70–99)

## 2022-01-23 NOTE — Patient Instructions (Addendum)
DUE TO COVID-19 ONLY TWO VISITORS  (aged 57 and older)  IS ALLOWED TO COME WITH YOU AND STAY IN THE WAITING ROOM ONLY DURING PRE OP AND PROCEDURE.   **NO VISITORS ARE ALLOWED IN THE SHORT STAY AREA OR RECOVERY ROOM!!**  IF YOU WILL BE ADMITTED INTO THE HOSPITAL YOU ARE ALLOWED ONLY FOUR SUPPORT PEOPLE DURING VISITATION HOURS ONLY (7 AM -8PM)   The support person(s) must pass our screening, gel in and out Visitors GUEST BADGE MUST BE WORN VISIBLY  One adult visitor may remain with you overnight and MUST be in the room by 8 P.M.   You are not required to [employer] often Do NOT share personal items Notify your provider if you are in close contact with someone who has COVID or you develop fever 100.4 or greater, new onset of sneezing, cough, sore throat, shortness of breath or body aches.        Your procedure is scheduled on:  02-01-22   Report to [facility] Main Entrance    Report to admitting at 6:15 AM   Call this number if you have problems the morning of surgery [phone]   Do not eat food :  After Midnight the night before surgery   After Midnight you may have the following liquids until  5:30 AM DAY OF SURGERY  Clear Liquid Diet Water Black Coffee (sugar ok, NO MILK/CREAM OR CREAMERS)  Tea (sugar ok, NO MILK/CREAM OR CREAMERS) regular and decaf                             Plain Jell-O (NO RED)                                           Fruit ices (not with fruit pulp, NO RED)                                     Popsicles (NO RED)                                                                  Juice: apple, WHITE grape, WHITE cranberry Sports drinks like Gatorade (NO RED) Clear broth(vegetable,chicken,beef)                       If you have questions, please contact your surgeon's office.   FOLLOW ANY ADDITIONAL PRE OP INSTRUCTIONS YOU RECEIVED FROM YOUR SURGEON'S OFFICE!!!     Oral Hygiene is also important to reduce your risk of infection.                                     Remember - BRUSH YOUR TEETH THE MORNING OF SURGERY WITH YOUR REGULAR TOOTHPASTE   Do NOT smoke after Midnight  Take these medicines the morning of surgery with A SIP OF WATER: Felodipine, Gabapentin, Lamictal, Keppra, Lorazepam, Metoprolol, Pantoprazole.  Okay to use Tylenol   DO  NOT [place]. PHARMACY WILL DISPENSE MEDICATIONS LISTED ON YOUR MEDICATION LIST TO YOU DURING YOUR ADMISSION [place]!  How to Manage Your Diabetes Before and After Surgery  Why is it important to control my blood sugar before and after surgery? Improving blood sugar levels before and after surgery helps healing and can limit problems. A way of improving blood sugar control is eating a healthy diet by:  Eating less sugar and carbohydrates  Increasing activity/exercise  Talking with your doctor about reaching your blood sugar goals High blood sugars (greater than 180 mg/dL) can raise your risk of infections and slow your recovery, so you will need to focus on controlling your diabetes during the weeks before surgery. Make sure that the doctor who takes care of your diabetes knows about your planned surgery including the date and location.  How do I manage my blood sugar before surgery? Check your blood sugar at least 4 times a day, starting 2 days before surgery, to make sure that the level is not too high or low. Check your blood sugar the morning of your surgery when you wake up and every 2 hours until you get to the Short Stay unit. If your blood sugar is less than 70 mg/dL, you will need to treat for low blood sugar: Do not take insulin. Treat a low blood sugar (less than 70 mg/dL) with  cup of clear juice (cranberry or apple), 4 glucose tablets, OR glucose gel. Recheck blood sugar in 15 minutes after treatment (to make sure it is greater than 70 mg/dL). If your blood sugar is not greater than 70 mg/dL on recheck, call [phone] for  further instructions. Report your blood sugar to the short stay nurse when you get to Short Stay.  If you are admitted to the hospital after surgery: Your blood sugar will be checked by the staff and you will probably be given insulin after surgery (instead of oral diabetes medicines) to make sure you have good blood sugar levels. The goal for blood sugar control after surgery is 80-180 mg/dL.   WHAT DO I DO ABOUT MY DIABETES MEDICATION?  Do not take oral diabetes medicines (pills) the morning of surgery.  THE DAY BEFORE SURGERY:  Take Metformin, Ozempic and Actos as prescribed.       THE MORNING OF SURGERY:  Do not take Metformin, Ozempic or Actos  Reviewed and Endorsed by [facility] Patient Education Committee, August 2015                               You may not have any metal on your body including hair pins, jewelry, and body piercing             Do not wear make-up, lotions, powders, perfumes or deodorant  Do not wear nail polish including gel and S&S, artificial/acrylic nails, or any other type of covering on natural nails including finger and toenails. If you have artificial nails, gel coating, etc. that needs to be removed by a nail salon please have this removed prior to surgery or surgery may need to be canceled/ delayed if the surgeon/ anesthesia feels like they are unable to be safely monitored.   Do not shave  48 hours prior to surgery.    Contacts, dentures or bridgework may not be worn into surgery.   Bring small overnight bag day of surgery.  Do not bring valuables  to the hospital. [place].   Please read over the following fact sheets you were given: IF [place] or [phone] (7/10 - 7/14)  [place] - Preparing for Surgery Before surgery, you can play an important role.  Because skin is not sterile, your skin needs to be as free of germs as possible.   You can reduce the number of germs on your skin by washing with CHG (chlorahexidine gluconate) soap before surgery.  CHG is an antiseptic cleaner which kills germs and bonds with the skin to continue killing germs even after washing. Please DO NOT use if you have an allergy to CHG or antibacterial soaps.  If your skin becomes reddened/irritated stop using the CHG and inform your nurse when you arrive at Short Stay. Do not shave (including legs and underarms) for at least 48 hours prior to the first CHG shower.  You may shave your face/neck.  Please follow these instructions carefully:  1.  Shower with CHG Soap the night before surgery and the  morning of surgery.  2.  If you choose to wash your hair, wash your hair first as usual with your normal  shampoo.  3.  After you shampoo, rinse your hair and body thoroughly to remove the shampoo.                             4.  Use CHG as you would any other liquid soap.  You can apply chg directly to the skin and wash.  Gently with a scrungie or clean washcloth.  5.  Apply the CHG Soap to your body ONLY FROM THE NECK DOWN.   Do   not use on face/ open                           Wound or open sores. Avoid contact with eyes, ears mouth and   genitals (private parts).                       Wash face,  Genitals (private parts) with your normal soap.             6.  Wash thoroughly, paying special attention to the area where your    surgery  will be performed.  7.  Thoroughly rinse your body with warm water from the neck down.  8.  DO NOT shower/wash with your normal soap after using and rinsing off the CHG Soap.                9.  Pat yourself dry with a clean towel.            10.  Wear clean pajamas.            11.  Place clean sheets on your bed the night of your first shower and do not  sleep with pets. Day of Surgery : Do not apply any lotions/deodorants the morning of surgery.  Please wear clean clothes to the hospital/surgery center.  FAILURE TO  FOLLOW THESE INSTRUCTIONS MAY RESULT IN THE CANCELLATION OF YOUR SURGERY  PATIENT SIGNATURE_________________________________  NURSE SIGNATURE__________________________________  ________________________________________________________________________    WHAT IS A BLOOD TRANSFUSION? Blood Transfusion Information  A transfusion is the replacement of blood or some of its parts. Blood is made up of  multiple cells which provide different functions. Red blood cells carry oxygen and are used for blood loss replacement. White blood cells fight against infection. Platelets control bleeding. Plasma helps clot blood. Other blood products are available for specialized needs, such as hemophilia or other clotting disorders. BEFORE THE TRANSFUSION  Who gives blood for transfusions?  Healthy volunteers who are fully evaluated to make sure their blood is safe. This is blood bank blood. Transfusion therapy is the safest it has ever been in the practice of medicine. Before blood is taken from a donor, a complete history is taken to make sure that person has no history of diseases nor engages in risky social behavior (examples are intravenous drug use or sexual activity with multiple partners). The donor's travel history is screened to minimize risk of transmitting infections, such as malaria. The donated blood is tested for signs of infectious diseases, such as HIV and hepatitis. The blood is then tested to be sure it is compatible with you in order to minimize the chance of a transfusion reaction. If you or a relative donates blood, this is often done in anticipation of surgery and is not appropriate for emergency situations. It takes many days to process the donated blood. RISKS AND COMPLICATIONS Although transfusion therapy is very safe and saves many lives, the main dangers of transfusion include:  Getting an infectious disease. Developing a transfusion reaction. This is an allergic reaction to something in  the blood you were given. Every precaution is taken to prevent this. The decision to have a blood transfusion has been considered carefully by your caregiver before blood is given. Blood is not given unless the benefits outweigh the risks. AFTER THE TRANSFUSION Right after receiving a blood transfusion, you will usually feel much better and more energetic. This is especially true if your red blood cells have gotten low (anemic). The transfusion raises the level of the red blood cells which carry oxygen, and this usually causes an energy increase. The nurse administering the transfusion will monitor you carefully for complications. HOME CARE INSTRUCTIONS  No special instructions are needed after a transfusion. You may find your energy is better. Speak with your caregiver about any limitations on activity for underlying diseases you may have. SEEK MEDICAL CARE IF:  Your condition is not improving after your transfusion. You develop redness or irritation at the intravenous (IV) site. SEEK IMMEDIATE MEDICAL CARE IF:  Any of the following symptoms occur over the next 12 hours: Shaking chills. You have a temperature by mouth above 102 F (38.9 C), not controlled by medicine. Chest, back, or muscle pain. People around you feel you are not acting correctly or are confused. Shortness of breath or difficulty breathing. Dizziness and fainting. You get a rash or develop hives. You have a decrease in urine output. Your urine turns a dark color or changes to pink, red, or brown. Any of the following symptoms occur over the next 10 days: You have a temperature by mouth above 102 F (38.9 C), not controlled by medicine. Shortness of breath. Weakness after normal activity. The white part of the eye turns yellow (jaundice). You have a decrease in the amount of urine or are urinating less often. Your urine turns a dark color or changes to pink, red, or brown. Document Released: 07/07/2000 Document  Revised: 10/02/2011 Document Reviewed: 02/24/2008 [facility] Patient Information 2014 [place], [place].  _______________________________________________________________________

## 2022-01-24 LAB — URINE CULTURE: Culture: NO GROWTH

## 2022-02-01 ENCOUNTER — Ambulatory Visit (HOSPITAL_COMMUNITY): Payer: BC Managed Care – PPO | Admitting: Anesthesiology

## 2022-02-01 ENCOUNTER — Encounter (HOSPITAL_COMMUNITY)
Admission: RE | Disposition: A | Discharge status: Home / Self Care | Payer: Self-pay | Source: Home / Self Care | Attending: Urology

## 2022-02-01 ENCOUNTER — Observation Stay (HOSPITAL_COMMUNITY)
Admission: RE | Admit: 2022-02-01 | Discharge: 2022-02-02 | Disposition: A | Discharge status: Home / Self Care | Payer: BC Managed Care – PPO | Attending: Urology | Admitting: Urology

## 2022-02-01 ENCOUNTER — Encounter (HOSPITAL_COMMUNITY): Payer: Self-pay | Admitting: Urology

## 2022-02-01 ENCOUNTER — Other Ambulatory Visit: Payer: Self-pay

## 2022-02-01 ENCOUNTER — Ambulatory Visit (HOSPITAL_COMMUNITY): Payer: BC Managed Care – PPO

## 2022-02-01 ENCOUNTER — Ambulatory Visit (HOSPITAL_COMMUNITY): Payer: BC Managed Care – PPO | Admitting: Physician Assistant

## 2022-02-01 DIAGNOSIS — Z7982 Long term (current) use of aspirin: Secondary | ICD-10-CM | POA: Insufficient documentation

## 2022-02-01 DIAGNOSIS — I10 Essential (primary) hypertension: Secondary | ICD-10-CM | POA: Insufficient documentation

## 2022-02-01 DIAGNOSIS — Z7984 Long term (current) use of oral hypoglycemic drugs: Secondary | ICD-10-CM | POA: Diagnosis not present

## 2022-02-01 DIAGNOSIS — N200 Calculus of kidney: Principal | ICD-10-CM

## 2022-02-01 DIAGNOSIS — E119 Type 2 diabetes mellitus without complications: Secondary | ICD-10-CM | POA: Insufficient documentation

## 2022-02-01 DIAGNOSIS — Z8673 Personal history of transient ischemic attack (TIA), and cerebral infarction without residual deficits: Secondary | ICD-10-CM | POA: Insufficient documentation

## 2022-02-01 DIAGNOSIS — Z86718 Personal history of other venous thrombosis and embolism: Secondary | ICD-10-CM | POA: Insufficient documentation

## 2022-02-01 DIAGNOSIS — Z79899 Other long term (current) drug therapy: Secondary | ICD-10-CM | POA: Insufficient documentation

## 2022-02-01 DIAGNOSIS — F17200 Nicotine dependence, unspecified, uncomplicated: Secondary | ICD-10-CM | POA: Diagnosis not present

## 2022-02-01 DIAGNOSIS — Z7902 Long term (current) use of antithrombotics/antiplatelets: Secondary | ICD-10-CM | POA: Insufficient documentation

## 2022-02-01 HISTORY — PX: NEPHROLITHOTOMY: SHX5134

## 2022-02-01 LAB — GLUCOSE, CAPILLARY
Glucose-Capillary: 139 mg/dL — ABNORMAL HIGH (ref 70–99)
Glucose-Capillary: 184 mg/dL — ABNORMAL HIGH (ref 70–99)
Glucose-Capillary: 246 mg/dL — ABNORMAL HIGH (ref 70–99)
Glucose-Capillary: 177 mg/dL — ABNORMAL HIGH (ref 70–99)

## 2022-02-01 LAB — BASIC METABOLIC PANEL
Anion gap: 9 (ref 5–15)
BUN: 20 mg/dL (ref 6–20)
CO2: 18 mmol/L — ABNORMAL LOW (ref 22–32)
Calcium: 10 mg/dL (ref 8.9–10.3)
Chloride: 112 mmol/L — ABNORMAL HIGH (ref 98–111)
Creatinine, Ser: 1.01 mg/dL — ABNORMAL HIGH (ref 0.44–1.00)
GFR, Estimated: >60 mL/min (ref >60)
Glucose, Bld: 174 mg/dL — ABNORMAL HIGH (ref 70–99)
Potassium: 4.3 mmol/L (ref 3.5–5.1)
Sodium: 139 mmol/L (ref 135–145)

## 2022-02-01 LAB — CBC
HCT: 36.4 % (ref 36.0–46.0)
Hemoglobin: 11.9 g/dL — ABNORMAL LOW (ref 12.0–15.0)
MCH: 31.6 pg (ref 26.0–34.0)
MCHC: 32.7 g/dL (ref 30.0–36.0)
MCV: 96.8 fL (ref 80.0–100.0)
Platelets: 176 10*3/uL (ref 150–400)
RBC: 3.76 MIL/uL — ABNORMAL LOW (ref 3.87–5.11)
RDW: 12.2 % (ref 11.5–15.5)
WBC: 13.7 10*3/uL — ABNORMAL HIGH (ref 4.0–10.5)
nRBC: 0 % (ref 0.0–0.2)

## 2022-02-01 LAB — TYPE AND SCREEN
ABO/RH(D): O POS
Antibody Screen: NEGATIVE

## 2022-02-01 LAB — ABO/RH: ABO/RH(D): O POS

## 2022-02-01 SURGERY — NEPHROLITHOTOMY PERCUTANEOUS
Anesthesia: General | Laterality: Right

## 2022-02-01 MED ORDER — FELODIPINE ER 5 MG PO TB24
5.0000 mg | ORAL_TABLET | Freq: Every day | ORAL | Status: DC
  Administered 2022-02-02: 5 mg via ORAL
  Filled 2022-02-01: qty 1

## 2022-02-01 MED ORDER — PANTOPRAZOLE SODIUM 40 MG PO TBEC
40.0000 mg | DELAYED_RELEASE_TABLET | Freq: Two times a day (BID) | ORAL | Status: DC
  Administered 2022-02-01 – 2022-02-02 (×2): 40 mg via ORAL
  Filled 2022-02-01 (×2): qty 1

## 2022-02-01 MED ORDER — LORAZEPAM 0.5 MG PO TABS: 0.5000 mg | ORAL_TABLET | Freq: Every day | ORAL | Status: DC | PRN

## 2022-02-01 MED ORDER — FENTANYL CITRATE (PF) 100 MCG/2ML IJ SOLN
INTRAMUSCULAR | Status: AC
  Filled 2022-02-01: qty 2

## 2022-02-01 MED ORDER — ROCURONIUM BROMIDE 10 MG/ML (PF) SYRINGE
PREFILLED_SYRINGE | INTRAVENOUS | Status: DC | PRN
  Administered 2022-02-01: 80 mg via INTRAVENOUS
  Administered 2022-02-01 (×2): 20 mg via INTRAVENOUS

## 2022-02-01 MED ORDER — GABAPENTIN 300 MG PO CAPS
300.0000 mg | ORAL_CAPSULE | Freq: Two times a day (BID) | ORAL | Status: DC
  Administered 2022-02-01 – 2022-02-02 (×2): 300 mg via ORAL
  Filled 2022-02-01 (×2): qty 1

## 2022-02-01 MED ORDER — BUPIVACAINE-EPINEPHRINE (PF) 0.25% -1:200000 IJ SOLN
INTRAMUSCULAR | Status: DC | PRN
  Administered 2022-02-01: 30 mL via PERINEURAL

## 2022-02-01 MED ORDER — OXYCODONE HCL 5 MG PO TABS: 5.0000 mg | ORAL_TABLET | Freq: Once | ORAL | Status: DC | PRN

## 2022-02-01 MED ORDER — DEXAMETHASONE SODIUM PHOSPHATE 10 MG/ML IJ SOLN
INTRAMUSCULAR | Status: DC | PRN
  Administered 2022-02-01: 10 mg via INTRAVENOUS

## 2022-02-01 MED ORDER — ROCURONIUM BROMIDE 10 MG/ML (PF) SYRINGE
PREFILLED_SYRINGE | INTRAVENOUS | Status: AC
  Filled 2022-02-01: qty 10

## 2022-02-01 MED ORDER — SUGAMMADEX SODIUM 200 MG/2ML IV SOLN
INTRAVENOUS | Status: DC | PRN
  Administered 2022-02-01: 200 mg via INTRAVENOUS

## 2022-02-01 MED ORDER — MORPHINE SULFATE (PF) 2 MG/ML IV SOLN: 2.0000 mg | INTRAVENOUS | Status: DC | PRN

## 2022-02-01 MED ORDER — LIDOCAINE HCL (CARDIAC) PF 100 MG/5ML IV SOSY
PREFILLED_SYRINGE | INTRAVENOUS | Status: DC | PRN
  Administered 2022-02-01: 100 mg via INTRAVENOUS

## 2022-02-01 MED ORDER — LACTATED RINGERS IV SOLN: INTRAVENOUS | Status: DC

## 2022-02-01 MED ORDER — BUPIVACAINE-EPINEPHRINE (PF) 0.25% -1:200000 IJ SOLN
INTRAMUSCULAR | Status: AC
  Filled 2022-02-01: qty 30

## 2022-02-01 MED ORDER — PROPOFOL 10 MG/ML IV BOLUS
INTRAVENOUS | Status: AC
  Filled 2022-02-01: qty 20

## 2022-02-01 MED ORDER — ACETAMINOPHEN 10 MG/ML IV SOLN
1000.0000 mg | Freq: Four times a day (QID) | INTRAVENOUS | Status: AC
  Administered 2022-02-01 – 2022-02-02 (×3): 1000 mg via INTRAVENOUS
  Filled 2022-02-01 (×3): qty 100

## 2022-02-01 MED ORDER — IOHEXOL 300 MG/ML  SOLN
INTRAMUSCULAR | Status: DC | PRN
  Administered 2022-02-01: 120 mL

## 2022-02-01 MED ORDER — SODIUM CHLORIDE 0.45 % IV SOLN: INTRAVENOUS | Status: DC

## 2022-02-01 MED ORDER — CEFAZOLIN SODIUM-DEXTROSE 2-4 GM/100ML-% IV SOLN
2.0000 g | INTRAVENOUS | Status: AC
  Administered 2022-02-01 (×2): 2 g via INTRAVENOUS
  Filled 2022-02-01: qty 100

## 2022-02-01 MED ORDER — HYDROMORPHONE HCL 1 MG/ML IJ SOLN: 0.5000 mg | INTRAMUSCULAR | Status: DC | PRN

## 2022-02-01 MED ORDER — ORAL CARE MOUTH RINSE: 15.0000 mL | Freq: Once | OROMUCOSAL | Status: AC

## 2022-02-01 MED ORDER — FENTANYL CITRATE (PF) 100 MCG/2ML IJ SOLN
INTRAMUSCULAR | Status: DC | PRN
Start: 2022-02-01 — End: 2022-02-01
  Administered 2022-02-01 (×2): 50 ug via INTRAVENOUS

## 2022-02-01 MED ORDER — POTASSIUM CHLORIDE CRYS ER 20 MEQ PO TBCR
20.0000 meq | EXTENDED_RELEASE_TABLET | Freq: Every day | ORAL | Status: DC
  Administered 2022-02-02: 20 meq via ORAL
  Filled 2022-02-01: qty 1

## 2022-02-01 MED ORDER — OXYCODONE HCL 5 MG/5ML PO SOLN: 5.0000 mg | Freq: Once | ORAL | Status: DC | PRN

## 2022-02-01 MED ORDER — ZOLPIDEM TARTRATE 5 MG PO TABS
5.0000 mg | ORAL_TABLET | Freq: Every evening | ORAL | Status: DC | PRN
  Administered 2022-02-01: 5 mg via ORAL
  Filled 2022-02-01: qty 1

## 2022-02-01 MED ORDER — PROPOFOL 10 MG/ML IV BOLUS
INTRAVENOUS | Status: DC | PRN
  Administered 2022-02-01: 90 mg via INTRAVENOUS

## 2022-02-01 MED ORDER — MORPHINE SULFATE (PF) 2 MG/ML IV SOLN: 1.0000 mg | INTRAVENOUS | Status: DC | PRN

## 2022-02-01 MED ORDER — HYDRALAZINE HCL 20 MG/ML IJ SOLN: 5.0000 mg | INTRAMUSCULAR | Status: DC | PRN

## 2022-02-01 MED ORDER — SODIUM CHLORIDE 0.9 % IR SOLN
Status: DC | PRN
  Administered 2022-02-01: 27000 mL

## 2022-02-01 MED ORDER — ONDANSETRON HCL 4 MG/2ML IJ SOLN
INTRAMUSCULAR | Status: DC | PRN
  Administered 2022-02-01: 4 mg via INTRAVENOUS

## 2022-02-01 MED ORDER — FUROSEMIDE 40 MG PO TABS
40.0000 mg | ORAL_TABLET | Freq: Every day | ORAL | Status: DC
  Administered 2022-02-02: 40 mg via ORAL
  Filled 2022-02-01: qty 1

## 2022-02-01 MED ORDER — METOPROLOL SUCCINATE ER 25 MG PO TB24
25.0000 mg | ORAL_TABLET | Freq: Every day | ORAL | Status: DC
  Administered 2022-02-02: 25 mg via ORAL
  Filled 2022-02-01: qty 1

## 2022-02-01 MED ORDER — TRAMADOL HCL 50 MG PO TABS: 50.0000 mg | ORAL_TABLET | Freq: Four times a day (QID) | ORAL | Status: DC | PRN

## 2022-02-01 MED ORDER — MIDAZOLAM HCL 2 MG/2ML IJ SOLN
INTRAMUSCULAR | Status: AC
  Filled 2022-02-01: qty 2

## 2022-02-01 MED ORDER — EPHEDRINE 5 MG/ML INJ
INTRAVENOUS | Status: AC
  Filled 2022-02-01: qty 5

## 2022-02-01 MED ORDER — LAMOTRIGINE 100 MG PO TABS
100.0000 mg | ORAL_TABLET | Freq: Every morning | ORAL | Status: DC
  Administered 2022-02-02: 100 mg via ORAL
  Filled 2022-02-01: qty 1

## 2022-02-01 MED ORDER — EPHEDRINE SULFATE-NACL 50-0.9 MG/10ML-% IV SOSY
PREFILLED_SYRINGE | INTRAVENOUS | Status: DC | PRN
  Administered 2022-02-01: 5 mg via INTRAVENOUS
  Administered 2022-02-01 (×2): 10 mg via INTRAVENOUS

## 2022-02-01 MED ORDER — ONDANSETRON HCL 4 MG/2ML IJ SOLN
INTRAMUSCULAR | Status: AC
Start: 2022-02-01 — End: ?
  Filled 2022-02-01: qty 2

## 2022-02-01 MED ORDER — IRBESARTAN 300 MG PO TABS
300.0000 mg | ORAL_TABLET | Freq: Every day | ORAL | Status: DC
  Administered 2022-02-02: 300 mg via ORAL
  Filled 2022-02-01: qty 1

## 2022-02-01 MED ORDER — DEXAMETHASONE SODIUM PHOSPHATE 10 MG/ML IJ SOLN
INTRAMUSCULAR | Status: AC
  Filled 2022-02-01: qty 1

## 2022-02-01 MED ORDER — INSULIN ASPART 100 UNIT/ML IJ SOLN
0.0000 [IU] | INTRAMUSCULAR | Status: DC
  Administered 2022-02-01: 4 [IU] via SUBCUTANEOUS
  Administered 2022-02-01: 7 [IU] via SUBCUTANEOUS
  Administered 2022-02-01: 4 [IU] via SUBCUTANEOUS
  Administered 2022-02-02: 3 [IU] via SUBCUTANEOUS

## 2022-02-01 MED ORDER — CHLORHEXIDINE GLUCONATE 0.12 % MT SOLN
15.0000 mL | Freq: Once | OROMUCOSAL | Status: AC
  Administered 2022-02-01: 15 mL via OROMUCOSAL

## 2022-02-01 MED ORDER — MIDAZOLAM HCL 5 MG/5ML IJ SOLN
INTRAMUSCULAR | Status: DC | PRN
  Administered 2022-02-01: 2 mg via INTRAVENOUS

## 2022-02-01 MED ORDER — ONDANSETRON HCL 4 MG/2ML IJ SOLN: 4.0000 mg | Freq: Once | INTRAMUSCULAR | Status: DC | PRN

## 2022-02-01 MED ORDER — LEVETIRACETAM 250 MG PO TABS
250.0000 mg | ORAL_TABLET | Freq: Two times a day (BID) | ORAL | Status: DC
  Administered 2022-02-01 – 2022-02-02 (×2): 250 mg via ORAL
  Filled 2022-02-01 (×2): qty 1

## 2022-02-01 MED ORDER — LIDOCAINE HCL (PF) 2 % IJ SOLN
INTRAMUSCULAR | Status: AC
  Filled 2022-02-01: qty 5

## 2022-02-01 MED ORDER — ACETAMINOPHEN 10 MG/ML IV SOLN: 1000.0000 mg | Freq: Once | INTRAVENOUS | Status: DC | PRN

## 2022-02-01 SURGICAL SUPPLY — 66 items
BAG COUNTER SPONGE SURGICOUNT (BAG) IMPLANT
BAG URINE DRAIN 2000ML AR STRL (UROLOGICAL SUPPLIES) IMPLANT
BASKET STONE NCOMPASS (UROLOGICAL SUPPLIES) IMPLANT
BASKET ZERO TIP NITINOL 2.4FR (BASKET) IMPLANT
BENZOIN TINCTURE PRP APPL 2/3 (GAUZE/BANDAGES/DRESSINGS) ×2 IMPLANT
BLADE SURG 15 STRL LF DISP TIS (BLADE) ×1 IMPLANT
BLADE SURG 15 STRL SS (BLADE) ×2
BOOTIES KNEE HIGH SLOAN (MISCELLANEOUS) IMPLANT
CATH 2WAY 30CC 24FR (CATHETERS) IMPLANT
CATH COUNCIL 22FR (CATHETERS) ×1 IMPLANT
CATH URETERAL DUAL LUMEN 10F (MISCELLANEOUS) ×2 IMPLANT
CATH URETL OPEN 5X70 (CATHETERS) ×2 IMPLANT
CATH UROLOGY TORQUE 65 (CATHETERS) ×1 IMPLANT
CATH X-FORCE N30 NEPHROSTOMY (TUBING) ×2 IMPLANT
CHLORAPREP W/TINT 26 (MISCELLANEOUS) ×2 IMPLANT
DRAPE C-ARM 42X120 X-RAY (DRAPES) ×2 IMPLANT
DRAPE LINGEMAN PERC (DRAPES) ×2 IMPLANT
DRAPE SURG IRRIG POUCH 19X23 (DRAPES) ×4 IMPLANT
DRSG PAD ABDOMINAL 8X10 ST (GAUZE/BANDAGES/DRESSINGS) ×2 IMPLANT
DRSG TEGADERM 8X12 (GAUZE/BANDAGES/DRESSINGS) ×4 IMPLANT
EXTRACTOR STONE 1.7FRX115CM (UROLOGICAL SUPPLIES) IMPLANT
GAUZE SPONGE 4X4 12PLY STRL (GAUZE/BANDAGES/DRESSINGS) IMPLANT
GLOVE SURG LX 7.5 STRW (GLOVE) ×1
GLOVE SURG LX STRL 7.5 STRW (GLOVE) ×1 IMPLANT
GOWN STRL REUS W/ TWL LRG LVL3 (GOWN DISPOSABLE) ×2 IMPLANT
GOWN STRL REUS W/ TWL XL LVL3 (GOWN DISPOSABLE) ×1 IMPLANT
GOWN STRL REUS W/TWL LRG LVL3 (GOWN DISPOSABLE) ×4
GOWN STRL REUS W/TWL XL LVL3 (GOWN DISPOSABLE) ×2
GUIDEWIRE AMPLAZ .035X145 (WIRE) ×4 IMPLANT
GUIDEWIRE ANG ZIPWIRE 038X150 (WIRE) IMPLANT
GUIDEWIRE STR DUAL SENSOR (WIRE) ×2 IMPLANT
GUIDEWIRE ZIPWRE .038 STRAIGHT (WIRE) ×1 IMPLANT
HLDR NDL AMPLATZ W/INSERTS (MISCELLANEOUS) IMPLANT
HOLDER NEEDLE AMPLATZ W/INSERT (MISCELLANEOUS) ×2 IMPLANT
IV SET EXTENSION CATH 6 NF (IV SETS) ×2 IMPLANT
KIT BASIN OR (CUSTOM PROCEDURE TRAY) ×2 IMPLANT
KIT PROBE 340X3.4XDISP GRN (MISCELLANEOUS) IMPLANT
KIT PROBE TRILOGY 3.4X340 (MISCELLANEOUS)
KIT PROBE TRILOGY 3.9X350 (MISCELLANEOUS) ×1 IMPLANT
LASER FIB FLEXIVA PULSE ID 365 (Laser) IMPLANT
MANIFOLD NEPTUNE II (INSTRUMENTS) ×2 IMPLANT
NDL SPNL 20GX3.5 QUINCKE YW (NEEDLE) IMPLANT
NDL TROCAR 18X15 ECHO (NEEDLE) IMPLANT
NDL TROCAR 18X20 (NEEDLE) IMPLANT
NEEDLE SPNL 20GX3.5 QUINCKE YW (NEEDLE) IMPLANT
NEEDLE TROCAR 18X15 ECHO (NEEDLE) IMPLANT
NEEDLE TROCAR 18X20 (NEEDLE) IMPLANT
NS IRRIG 1000ML POUR BTL (IV SOLUTION) ×2 IMPLANT
PACK CYSTO (CUSTOM PROCEDURE TRAY) ×2 IMPLANT
SHEATH PEELAWAY SET 9 (SHEATH) IMPLANT
SPONGE T-LAP 4X18 ~~LOC~~+RFID (SPONGE) ×2 IMPLANT
STENT URET 6FRX24 CONTOUR (STENTS) ×1 IMPLANT
SUT ETHILON 3 0 PS 1 (SUTURE) ×1 IMPLANT
SUT SILK 0 (SUTURE) ×2
SUT SILK 0 30XBRD TIE 6 (SUTURE) ×1 IMPLANT
SYR 10ML LL (SYRINGE) ×2 IMPLANT
SYR 20ML LL LF (SYRINGE) ×4 IMPLANT
SYR 50ML LL SCALE MARK (SYRINGE) ×2 IMPLANT
SYR CONTROL 10ML LL (SYRINGE) ×1 IMPLANT
TOWEL OR 17X26 10 PK STRL BLUE (TOWEL DISPOSABLE) ×2 IMPLANT
TRACTIP FLEXIVA PULS ID 200XHI (Laser) IMPLANT
TRACTIP FLEXIVA PULSE ID 200 (Laser)
TRAY FOLEY MTR SLVR 16FR STAT (SET/KITS/TRAYS/PACK) ×2 IMPLANT
TUBING CONNECTING 10 (TUBING) ×4 IMPLANT
TUBING STONE CATCHER TRILOGY (MISCELLANEOUS) ×1 IMPLANT
TUBING UROLOGY SET (TUBING) ×2 IMPLANT

## 2022-02-01 NOTE — Anesthesia Preprocedure Evaluation (Signed)
Anesthesia Evaluation  Patient identified by MRN, date of birth, ID band Patient awake    Reviewed: Allergy & Precautions, NPO status , Patient's Chart, lab work & pertinent test results  History of Anesthesia Complications (+) PONV and history of anesthetic complications  Airway Mallampati: II  TM Distance: >3 FB Neck ROM: Full    Dental no notable dental hx.    Pulmonary sleep apnea , Current Smoker,    Pulmonary exam normal breath sounds clear to auscultation       Cardiovascular hypertension, Normal cardiovascular exam Rhythm:Regular Rate:Normal     Neuro/Psych CVA negative psych ROS   GI/Hepatic negative GI ROS, Neg liver ROS,   Endo/Other  diabetesMorbid obesity  Renal/GU negative Renal ROS  negative genitourinary   Musculoskeletal negative musculoskeletal ROS (+)   Abdominal   Peds negative pediatric ROS (+)  Hematology negative hematology ROS (+)   Anesthesia Other Findings   Reproductive/Obstetrics negative OB ROS                             Anesthesia Physical Anesthesia Plan  ASA: 3  Anesthesia Plan: General   Post-op Pain Management:    Induction: Intravenous  PONV Risk Score and Plan: 3 and Ondansetron, Dexamethasone and Treatment may vary due to age or medical condition  Airway Management Planned: Oral ETT  Additional Equipment:   Intra-op Plan:   Post-operative Plan: Extubation in OR  Informed Consent: I have reviewed the patients History and Physical, chart, labs and discussed the procedure including the risks, benefits and alternatives for the proposed anesthesia with the patient or authorized representative who has indicated his/her understanding and acceptance.     Dental advisory given  Plan Discussed with: CRNA and Surgeon  Anesthesia Plan Comments:         Anesthesia Quick Evaluation

## 2022-02-01 NOTE — Op Note (Signed)
Pre-operative diagnosis: left renal pelvis stones, 2.5cm post-operative diagnosis: as above   Procedure performed: cystoscopy, left retrograde pyelogram with interpretation, left percutaneous renal access, left nephrolithotomy, left nephrostogram,   Left ureteral stent placement, left nephrostomy tube placement.    Surgeon: Dr. Ardis Hughs  Assistant: none  Anesthesia: General  Complications: None  Specimens: The majority of the stones were removed and will be sent to the [facility] lab for further analysis.  Findings: 1.  The patient's UPJ stone was completely obstructive, I was unable to get a wire beyond it initially.  Ultimately I was able to get a Glidewire by it but was unable to advance the open-ended catheter via.  Fortunately I was able to opacify the collecting system enough to perform a percutaneous access. #2: Access was obtained in the lower pole.  I was able to access all of the stone and at the end of the case the patient appeared to be stone free. 3.:  A 24 cm time 6 French double-J ureteral stent was placed in antegrade fashion with a nice curl noted to be within the bladder fluoroscopically. #4: A 73 French council tip catheter was advanced over the safety wire and into the lower pole access site.  I was unable to remove the catheter because of ongoing bleeding.  EBL: Approximately  400 cc  Specimens: stone was sent for stone analysis.  Indication: Jill [name] is a 56 y.o. patient with a history of nephrolithiasis with a large stone burden in the left kidney. After reviewing the management options for treatment, he elected to proceed with the above surgical procedure(s). We have discussed the potential benefits and risks of the procedure, side effects of the proposed treatment, the likelihood of the patient achieving the goals of the procedure, and any potential problems that might occur during the procedure or recuperation. Informed consent has been  obtained.   Description:  Consent was obtained in the preoperative holding area. The patient was marked appropriately and then taken back to the operating room where he was intubated on the gurney. The patient was flipped prone onto the split leg OR table. Large jelly rolls were placed in the anterior axillary line on both sides allowing the patient's chest and abdomen to fall inbetween. The patient was then prepped and draped in the routine sterile fashion in the left flank and genital area. A timeout was then held confirming the proper side and procedure as well as antibiotics were administered.  I then used the flexible cystoscope and passed gently into the patient's urethra under visual guidance. Once into the bladder I  passed a wire through into the left collecting system. I the exchanged the wire for a 5 [place] open-ended ureteral Pollock catheter. The Pollack catheter was then advanced up to the UPJ. The wire was then removed and a retrograde pyelogram was performed with the above findings.  Initially, I was unable to get contrast beyond the obstructing stone.  However, with some manipulation of the catheter and using a Glidewire was able to get beyond the stone and ultimately contrast passed a stone in the collecting system opacified.  I then turned my attention to the patient's left flank and obtaining percutaneous renal access.  Using the C-arm rotated at 25 and the bulls-eye technique with an 18-gauge coaxial needle the lower posterior lateral calyx was targeted. Then rotating the C-arm AP depth of our needle was noted to be within the calyx and the inner part of the coaxial  needle was removed. Urine was noted to return. A 0.038 sensor wire was then passed through the sheath of the coaxial needle and into the left renal collecting system. The wire was then passed down the ureter and into the bladder using fluoroscopic guide and the sheath of the needle was removed.  An angiographic catheter was  then advanced into the bladder and the wire removed.  A Super Stiff wire was then passed into the angiographic catheter and the angiographic catheter removed.  A dual-lumen catheter was then advanced over the wire and a second Super Stiff wire placed into the patient's bladder under fluoroscopic guidance, establishing 2 superstiff wires through the targeted calyx and into the bladder.   The 54 French NephroMax balloon was then passed over one of the Super Stiff wires and the tip guided down into the targeted calyx. The balloon was then inflated to approximately 12 atm, and once there was no waist noted under fluoroscopy the access sheath was advanced over the balloon. The balloon was then removed. The wires were then placed back into the sheaths and snapped to the drape.   Using the rigid nephroscope to explore the targeted calyx and kidney.  The stones were then removed from the UPJ and the lower pole. Then using a flexible cystoscope to navigate the remaining calyces of the kidney multiple smaller stone fragments encountered.   Contrast was injected through the cystoscope and the calyces systematically inspected under fluoroscopic guidance to ensure that all stone fragments had been removed.   Then using the flexible ureteroscope the ureter was navigated in antegrade fashion. All stone fragments were pushed from the ureter into the bladder. Once the ureter was clear the scope was advanced into the bladder and a 0.038 sensor wire was left in the bladder and the scope backed out over wire. The sensor wire was then backloaded over the rigid nephroscope using the stent pusher and a  24 cm x 6 French double-J ureteral stent was passed antegrade over the sensor wire down into the bladder under fluoroscopic guidance. Once the stent was in the bladder the wire was gently pulled back and a nice curl noted in the bladder. The wire completely removed from the stent, and nice curl on the proximal end of the stent was  noted in the renal pelvis. The sheath was then backed out slowly to ensure that all calyces had been inspected and there was nothing behind the sheath.   A 40F counsel tip catheter was then passed over one of the Super Stiff wires through the sheath and into the renal pelvis. The sheath was then backed out of the kidney and cut over the red rubber catheter. A nephrostogram was then performed confirming the position of our nephrostomy tube and reassuring that there were no longer any filling defects from the patient's symptoms. 25 cc of local anesthesia was then injected into the patient's wound, and the wound was closed with 3-0 nylon in 2 vertical mattress sutures. The incision was then padded using a bundle of 4 x 4's and Hypafix tape. Patient was subsequently rolled over to the supine position and extubated. The patient was returned to the PACU in excellent condition. At the end of the case all lap and needle and sponges were accounted for. There are no perioperative complications.

## 2022-02-01 NOTE — Plan of Care (Signed)

## 2022-02-01 NOTE — Transfer of Care (Signed)
Immediate Anesthesia Transfer of Care Note  Patient: ERTHA NABOR  Procedure(s) Performed: RIGHT NEPHROLITHOTOMY PERCUTANEOUS WITH SURGEON ACCESS (Right)  Patient Location: PACU  Anesthesia Type:General  Level of Consciousness: sedated  Airway & Oxygen Therapy: Patient Spontanous Breathing and Patient connected to face mask oxygen  Post-op Assessment: Patient stable Patient with small skin tear below R eye . Reported to Dr. Kalman Shan.  Post vital signs: Reviewed and stable  Last Vitals:  Vitals Value Taken Time  BP 112/53 02/01/22 1230  Temp    Pulse 62 02/01/22 1231  Resp 13 02/01/22 1231  SpO2 100 % 02/01/22 1231  Vitals shown include unvalidated device data.  Last Pain:  Vitals:   02/01/22 0650  TempSrc:   PainSc: 0-No pain         Complications: No notable events documented.

## 2022-02-01 NOTE — Anesthesia Postprocedure Evaluation (Signed)
Anesthesia Post Note  Patient: Jill [name]  Procedure(s) Performed: RIGHT NEPHROLITHOTOMY PERCUTANEOUS WITH SURGEON ACCESS (Right)     Patient location during evaluation: PACU Anesthesia Type: General Level of consciousness: awake and alert Pain management: pain level controlled Vital Signs Assessment: post-procedure vital signs reviewed and stable Respiratory status: spontaneous breathing, nonlabored ventilation, respiratory function stable and patient connected to nasal cannula oxygen Cardiovascular status: blood pressure returned to baseline and stable Postop Assessment: no apparent nausea or vomiting Anesthetic complications: no   No notable events documented.  Last Vitals:  Vitals:   02/01/22 1230 02/01/22 1245  BP: (!) 112/53 117/62  Pulse: 60 63  Resp: 13 19  Temp:    SpO2: 100% 99%    Last Pain:  Vitals:   02/01/22 1245  TempSrc:   PainSc: 0-No pain                 Brigetta Beckstrom S

## 2022-02-01 NOTE — Anesthesia Procedure Notes (Signed)
Procedure Name: Intubation Date/Time: 02/01/2022 8:39 AM  Performed by: Lind Covert, CRNAPre-anesthesia Checklist: Patient identified, Emergency Drugs available, Suction available, Patient being monitored and Timeout performed Patient Re-evaluated:Patient Re-evaluated prior to induction Oxygen Delivery Method: Circle system utilized Preoxygenation: Pre-oxygenation with 100% oxygen Induction Type: IV induction Ventilation: Mask ventilation without difficulty Laryngoscope Size: Mac and 4 Grade View: Grade I Tube type: Oral Tube size: 7.0 mm Number of attempts: 1 Airway Equipment and Method: Stylet Placement Confirmation: ETT inserted through vocal cords under direct vision, positive ETCO2 and breath sounds checked- equal and bilateral Secured at: 22 cm Tube secured with: Tape Dental Injury: Teeth and Oropharynx as per pre-operative assessment

## 2022-02-01 NOTE — Interval H&P Note (Signed)
History and Physical Interval Note:  02/01/2022 8:03 AM  Jill [name]  has presented today for surgery, with the diagnosis of RIGHT RENAL STONES.  The various methods of treatment have been discussed with the patient and family. After consideration of risks, benefits and other options for treatment, the patient has consented to  Procedure(s): RIGHT NEPHROLITHOTOMY PERCUTANEOUS WITH SURGEON ACCESS (Right) as a surgical intervention.  The patient's history has been reviewed, patient examined, no change in status, stable for surgery.  I have reviewed the patient's chart and labs.  Questions were answered to the patient's satisfaction.     Ardis Hughs

## 2022-02-01 NOTE — H&P (Signed)
56 year old female with no real urologic history came in today for further discussion of bilateral nephrolithiasis. She had a CT scan done for a left-sided lower abdominal pain. This demonstrated a right proximal ureteral stone with right hydronephrosis and a large right lower pole partial static. In addition, the patient was noted to have left-sided kidney stones that were quite significant as well. The patient was having no significant issue with urinary tract infections or gross hematuria. She was having no real significant right-sided flank pain. She has recently been evaluated for hypercalcemia.   The patient does have a history of diabetes, DVT, seizures, and stroke. She takes Plavix.     ALLERGIES: Contrast Dye Crab Dilaudid TABS Shellfish Containing Products    MEDICATIONS: Metformin Hcl 1,000 mg tablet  Metoprolol Succinate 25 mg tablet, extended release 24 hr  Plavix 75 mg tablet  Acetaminophen 500 mg capsule  Actos 15 mg tablet Oral  Amlodipine Besylate 5 mg tablet  Aspirin Ec 81 mg tablet, delayed release  Benicar 40 mg tablet Oral  Calcium Citrate - Vitamin D  Colace 100 mg capsule  Crestor 20 mg tablet Oral  Furosemide 40 mg tablet  Gabapentin 300 mg capsule  Ibuprofen 800 mg tablet  Keppra 500 mg tablet Oral  Lamictal 100 mg tablet Oral  Lamotrigine 100 mg tablet  Lasix 40 mg tablet Oral  Levetiracetam 500 mg tablet  Lorazepam 1 mg tablet  Metformin Hcl 1,000 mg tablet Oral  Multivitamin  Ozempic 1 mg/0.75 ml (4 mg/3 ml) pen injector  Pantoprazole Sodium 40 mg tablet, delayed release  Plavix 75 mg tablet Oral  Plendil 5 Mg  Potassium Chloride 20 meq tablet, ext release, particles/crystals Oral  Ranitidine Hcl 150 mg tablet Oral  Vitamin B12     GU PSH: None     PSH Notes: Pelvic Repair, Cesarean Section x2 ([place]), Gallbladder Surgery, cardiac catheterization, Dilatation & curettage/hysteroscopy with myosure (04/2017), IR angio intra extracran sel com  carotid (02/2018), laparoscopic gastric sleeve resection (03/2016), left heart catheterization with coronary angiogram (09/2014), tubal ligation   NON-GU PSH: Appendectomy (laparoscopic) Cesarean Delivery Only - 2016     GU PMH: Renal cyst, Renal cyst, acquired, left - 2016      PMH Notes:  2015-02-11 15:03:01 - Note: Seizure   NON-GU PMH: Anxiety, Anxiety and depression - 2016 DVT, History, History of deep venous thrombosis - 2016 Encounter for general adult medical examination without abnormal findings, Encounter for preventive health examination - 2016 Personal history of other diseases of the circulatory system, History of hypertension - 2016, History of cardiac murmur, - 2016 Personal history of other diseases of the nervous system and sense organs, History of sleep apnea - 2016 Personal history of other endocrine, nutritional and metabolic disease, History of diabetes mellitus - 2016 Personal history of other specified conditions, History of heartburn - 2016 Personal history of transient ischemic attack (TIA), and cerebral infarction without residual deficits, History of stroke - 2016 Depression Diabetes Type 2 GERD Hypercholesterolemia Hypertension Seizure disorder Sleep Apnea Stroke/TIA    FAMILY HISTORY: COPD (chronic obstructive pulmonary disease) with - Father Death of family member - Runs In Family Diabetes - Father Heart Attack - Father Hypertension - Mother, Father nephrolithiasis - Mother renal failure - Runs In Family stroke - Grandmother   SOCIAL HISTORY: No Social History     Notes: Occupation, Number of children, Alcohol use, Current every day smoker, Married, Caffeine use   REVIEW OF SYSTEMS:    GU Review  Female:   Patient reports stream starts and stops and trouble starting your stream. Patient denies frequent urination, hard to postpone urination, burning /pain with urination, get up at night to urinate, leakage of urine, have to strain to urinate, and  being pregnant.  Gastrointestinal (Upper):   Patient denies nausea, vomiting, and indigestion/ heartburn.  Gastrointestinal (Lower):   Patient denies diarrhea and constipation.  Constitutional:   Patient denies fever, night sweats, weight loss, and fatigue.  Skin:   Patient denies skin rash/ lesion and itching.  Eyes:   Patient denies blurred vision and double vision.  Ears/ Nose/ Throat:   Patient denies sore throat and sinus problems.  Hematologic/Lymphatic:   Patient denies swollen glands and easy bruising.  Cardiovascular:   Patient denies leg swelling and chest pains.  Respiratory:   Patient denies cough and shortness of breath.  Endocrine:   Patient denies excessive thirst.  Musculoskeletal:   Patient denies back pain and joint pain.  Neurological:   Patient denies headaches and dizziness.  Psychologic:   Patient denies depression and anxiety.   Notes: Vaginal bleeding    VITAL SIGNS:      12/09/2021 02:58 PM  Weight 205 lb / 92.99 kg  Height 66 in / 167.64 cm  BP 145/85 mmHg  Pulse 83 /min  Temperature 97.5 F / 36.3 C  BMI 33.1 kg/m   MULTI-SYSTEM PHYSICAL EXAMINATION:    Constitutional: Well-nourished. No physical deformities. Normally developed. Good grooming.  Neck: Neck symmetrical, not swollen. Normal tracheal position.  Respiratory: No labored breathing, no use of accessory muscles.   Cardiovascular: Normal temperature, normal extremity pulses, no swelling, no varicosities.  Lymphatic: No enlargement of neck, axillae, groin.  Skin: No paleness, no jaundice, no cyanosis. No lesion, no ulcer, no rash.  Neurologic / Psychiatric: Oriented to time, oriented to place, oriented to person. No depression, no anxiety, no agitation.  Gastrointestinal: No mass, no tenderness, no rigidity, non obese abdomen.  Eyes: Normal conjunctivae. Normal eyelids.  Ears, Nose, Mouth, and Throat: Left ear no scars, no lesions, no masses. Right ear no scars, no lesions, no masses. Nose no  scars, no lesions, no masses. Normal hearing. Normal lips.  Musculoskeletal: Normal gait and station of head and neck.     Complexity of Data:  Source Of History:  Patient  Records Review:   Previous Doctor Records, Previous Patient Records  Urine Test Review:   Urinalysis  X-Ray Review: C.T. Abdomen/Pelvis: Reviewed Films. Discussed With Patient.     PROCEDURES:          Urinalysis w/Scope - 81001 Dipstick Dipstick Cont'd Micro  Color: Yellow Bilirubin: Neg WBC/hpf: 40 - 60/hpf  Appearance: Slightly Cloudy Ketones: Neg RBC/hpf: 10 - 20/hpf  Specific Gravity: 1.020 Blood: 3+ Bacteria: Mod (26-50/hpf)  pH: <=5.0 Protein: 1+ Cystals: NS (Not Seen)  Glucose: Neg Urobilinogen: 0.2 Casts: NS (Not Seen)    Nitrites: Neg Trichomonas: Not Present    Leukocyte Esterase: 3+ Mucous: Present      Epithelial Cells: 0 - 5/hpf      Yeast: NS (Not Seen)      Sperm: Not Present    Notes:      ASSESSMENT:      ICD-10 Details  1 GU:   Renal calculus - N20.0    PLAN:           Document Letter(s):  Created for Patient: Clinical Summary         Notes:   The patient has bilateral  nephrolithiasis. She has a large stone burden bilaterally. She has an obstructing stone in the right proximal ureter, which fortunately is not accompanied by pain. She has no evidence of infection. I discussed the treatment options with this patient. I recommended that she strongly consider PCNL. We discussed this surgery in detail. She will need surgery on both sides. I think this is the most efficient way to do it. It does require hospitalization. There is an increased risk of bleeding as well as pain. Our plan is to proceed with right-sided PCNL followed by left PCNL 2 weeks later. We will go ahead and get this scheduled. She will need to stop her Plavix. She will need a urine culture prior to her scheduled surgery.

## 2022-02-01 NOTE — H&P (View-Only) (Signed)
56 year old female with no real urologic history came in today for further discussion of bilateral nephrolithiasis. She had a CT scan done for a left-sided lower abdominal pain. This demonstrated a right proximal ureteral stone with right hydronephrosis and a large right lower pole partial static. In addition, the patient was noted to have left-sided kidney stones that were quite significant as well. The patient was having no significant issue with urinary tract infections or gross hematuria. She was having no real significant right-sided flank pain. She has recently been evaluated for hypercalcemia.   The patient does have a history of diabetes, DVT, seizures, and stroke. She takes Plavix.     ALLERGIES: Contrast Dye Crab Dilaudid TABS Shellfish Containing Products    MEDICATIONS: Metformin Hcl 1,000 mg tablet  Metoprolol Succinate 25 mg tablet, extended release 24 hr  Plavix 75 mg tablet  Acetaminophen 500 mg capsule  Actos 15 mg tablet Oral  Amlodipine Besylate 5 mg tablet  Aspirin Ec 81 mg tablet, delayed release  Benicar 40 mg tablet Oral  Calcium Citrate - Vitamin D  Colace 100 mg capsule  Crestor 20 mg tablet Oral  Furosemide 40 mg tablet  Gabapentin 300 mg capsule  Ibuprofen 800 mg tablet  Keppra 500 mg tablet Oral  Lamictal 100 mg tablet Oral  Lamotrigine 100 mg tablet  Lasix 40 mg tablet Oral  Levetiracetam 500 mg tablet  Lorazepam 1 mg tablet  Metformin Hcl 1,000 mg tablet Oral  Multivitamin  Ozempic 1 mg/0.75 ml (4 mg/3 ml) pen injector  Pantoprazole Sodium 40 mg tablet, delayed release  Plavix 75 mg tablet Oral  Plendil 5 Mg  Potassium Chloride 20 meq tablet, ext release, particles/crystals Oral  Ranitidine Hcl 150 mg tablet Oral  Vitamin B12     GU PSH: None     PSH Notes: Pelvic Repair, Cesarean Section x2 ([place]), Gallbladder Surgery, cardiac catheterization, Dilatation & curettage/hysteroscopy with myosure (04/2017), IR angio intra extracran sel com  carotid (02/2018), laparoscopic gastric sleeve resection (03/2016), left heart catheterization with coronary angiogram (09/2014), tubal ligation   NON-GU PSH: Appendectomy (laparoscopic) Cesarean Delivery Only - 2016     GU PMH: Renal cyst, Renal cyst, acquired, left - 2016      PMH Notes:  2015-02-11 15:03:01 - Note: Seizure   NON-GU PMH: Anxiety, Anxiety and depression - 2016 DVT, History, History of deep venous thrombosis - 2016 Encounter for general adult medical examination without abnormal findings, Encounter for preventive health examination - 2016 Personal history of other diseases of the circulatory system, History of hypertension - 2016, History of cardiac murmur, - 2016 Personal history of other diseases of the nervous system and sense organs, History of sleep apnea - 2016 Personal history of other endocrine, nutritional and metabolic disease, History of diabetes mellitus - 2016 Personal history of other specified conditions, History of heartburn - 2016 Personal history of transient ischemic attack (TIA), and cerebral infarction without residual deficits, History of stroke - 2016 Depression Diabetes Type 2 GERD Hypercholesterolemia Hypertension Seizure disorder Sleep Apnea Stroke/TIA    FAMILY HISTORY: COPD (chronic obstructive pulmonary disease) with - Father Death of family member - Runs In Family Diabetes - Father Heart Attack - Father Hypertension - Mother, Father nephrolithiasis - Mother renal failure - Runs In Family stroke - Grandmother   SOCIAL HISTORY: No Social History     Notes: Occupation, Number of children, Alcohol use, Current every day smoker, Married, Caffeine use   REVIEW OF SYSTEMS:    GU Review  Female:   Patient reports stream starts and stops and trouble starting your stream. Patient denies frequent urination, hard to postpone urination, burning /pain with urination, get up at night to urinate, leakage of urine, have to strain to urinate, and  being pregnant.  Gastrointestinal (Upper):   Patient denies nausea, vomiting, and indigestion/ heartburn.  Gastrointestinal (Lower):   Patient denies diarrhea and constipation.  Constitutional:   Patient denies fever, night sweats, weight loss, and fatigue.  Skin:   Patient denies skin rash/ lesion and itching.  Eyes:   Patient denies blurred vision and double vision.  Ears/ Nose/ Throat:   Patient denies sore throat and sinus problems.  Hematologic/Lymphatic:   Patient denies swollen glands and easy bruising.  Cardiovascular:   Patient denies leg swelling and chest pains.  Respiratory:   Patient denies cough and shortness of breath.  Endocrine:   Patient denies excessive thirst.  Musculoskeletal:   Patient denies back pain and joint pain.  Neurological:   Patient denies headaches and dizziness.  Psychologic:   Patient denies depression and anxiety.   Notes: Vaginal bleeding    VITAL SIGNS:      12/09/2021 02:58 PM  Weight 205 lb / 92.99 kg  Height 66 in / 167.64 cm  BP 145/85 mmHg  Pulse 83 /min  Temperature 97.5 F / 36.3 C  BMI 33.1 kg/m   MULTI-SYSTEM PHYSICAL EXAMINATION:    Constitutional: Well-nourished. No physical deformities. Normally developed. Good grooming.  Neck: Neck symmetrical, not swollen. Normal tracheal position.  Respiratory: No labored breathing, no use of accessory muscles.   Cardiovascular: Normal temperature, normal extremity pulses, no swelling, no varicosities.  Lymphatic: No enlargement of neck, axillae, groin.  Skin: No paleness, no jaundice, no cyanosis. No lesion, no ulcer, no rash.  Neurologic / Psychiatric: Oriented to time, oriented to place, oriented to person. No depression, no anxiety, no agitation.  Gastrointestinal: No mass, no tenderness, no rigidity, non obese abdomen.  Eyes: Normal conjunctivae. Normal eyelids.  Ears, Nose, Mouth, and Throat: Left ear no scars, no lesions, no masses. Right ear no scars, no lesions, no masses. Nose no  scars, no lesions, no masses. Normal hearing. Normal lips.  Musculoskeletal: Normal gait and station of head and neck.     Complexity of Data:  Source Of History:  Patient  Records Review:   Previous Doctor Records, Previous Patient Records  Urine Test Review:   Urinalysis  X-Ray Review: C.T. Abdomen/Pelvis: Reviewed Films. Discussed With Patient.     PROCEDURES:          Urinalysis w/Scope - 81001 Dipstick Dipstick Cont'd Micro  Color: Yellow Bilirubin: Neg WBC/hpf: 40 - 60/hpf  Appearance: Slightly Cloudy Ketones: Neg RBC/hpf: 10 - 20/hpf  Specific Gravity: 1.020 Blood: 3+ Bacteria: Mod (26-50/hpf)  pH: <=5.0 Protein: 1+ Cystals: NS (Not Seen)  Glucose: Neg Urobilinogen: 0.2 Casts: NS (Not Seen)    Nitrites: Neg Trichomonas: Not Present    Leukocyte Esterase: 3+ Mucous: Present      Epithelial Cells: 0 - 5/hpf      Yeast: NS (Not Seen)      Sperm: Not Present    Notes:      ASSESSMENT:      ICD-10 Details  1 GU:   Renal calculus - N20.0    PLAN:           Document Letter(s):  Created for Patient: Clinical Summary         Notes:   The patient has bilateral  nephrolithiasis. She has a large stone burden bilaterally. She has an obstructing stone in the right proximal ureter, which fortunately is not accompanied by pain. She has no evidence of infection. I discussed the treatment options with this patient. I recommended that she strongly consider PCNL. We discussed this surgery in detail. She will need surgery on both sides. I think this is the most efficient way to do it. It does require hospitalization. There is an increased risk of bleeding as well as pain. Our plan is to proceed with right-sided PCNL followed by left PCNL 2 weeks later. We will go ahead and get this scheduled. She will need to stop her Plavix. She will need a urine culture prior to her scheduled surgery.

## 2022-02-02 ENCOUNTER — Encounter (HOSPITAL_COMMUNITY): Payer: Self-pay | Admitting: Urology

## 2022-02-02 DIAGNOSIS — N200 Calculus of kidney: Secondary | ICD-10-CM | POA: Diagnosis not present

## 2022-02-02 LAB — GLUCOSE, CAPILLARY
Glucose-Capillary: 133 mg/dL — ABNORMAL HIGH (ref 70–99)
Glucose-Capillary: 96 mg/dL (ref 70–99)
Glucose-Capillary: 97 mg/dL (ref 70–99)

## 2022-02-02 LAB — BASIC METABOLIC PANEL
Anion gap: 7 (ref 5–15)
BUN: 20 mg/dL (ref 6–20)
CO2: 23 mmol/L (ref 22–32)
Calcium: 8.9 mg/dL (ref 8.9–10.3)
Chloride: 110 mmol/L (ref 98–111)
Creatinine, Ser: 0.82 mg/dL (ref 0.44–1.00)
GFR, Estimated: >60 mL/min (ref >60)
Glucose, Bld: 100 mg/dL — ABNORMAL HIGH (ref 70–99)
Potassium: 3.4 mmol/L — ABNORMAL LOW (ref 3.5–5.1)
Sodium: 140 mmol/L (ref 135–145)

## 2022-02-02 LAB — CBC
HCT: 28.8 % — ABNORMAL LOW (ref 36.0–46.0)
HCT: 33 % — ABNORMAL LOW (ref 36.0–46.0)
Hemoglobin: 9.4 g/dL — ABNORMAL LOW (ref 12.0–15.0)
Hemoglobin: 10.8 g/dL — ABNORMAL LOW (ref 12.0–15.0)
MCH: 31.2 pg (ref 26.0–34.0)
MCH: 31.5 pg (ref 26.0–34.0)
MCHC: 32.6 g/dL (ref 30.0–36.0)
MCHC: 32.7 g/dL (ref 30.0–36.0)
MCV: 95.7 fL (ref 80.0–100.0)
MCV: 96.2 fL (ref 80.0–100.0)
Platelets: 154 10*3/uL (ref 150–400)
Platelets: 177 10*3/uL (ref 150–400)
RBC: 3.01 MIL/uL — ABNORMAL LOW (ref 3.87–5.11)
RBC: 3.43 MIL/uL — ABNORMAL LOW (ref 3.87–5.11)
RDW: 12.3 % (ref 11.5–15.5)
RDW: 12.4 % (ref 11.5–15.5)
WBC: 11.9 10*3/uL — ABNORMAL HIGH (ref 4.0–10.5)
WBC: 13.6 10*3/uL — ABNORMAL HIGH (ref 4.0–10.5)
nRBC: 0 % (ref 0.0–0.2)
nRBC: 0 % (ref 0.0–0.2)

## 2022-02-02 LAB — SURGICAL PATHOLOGY

## 2022-02-02 MED ORDER — TRAMADOL HCL 50 MG PO TABS: 50.0000 mg | ORAL_TABLET | Freq: Four times a day (QID) | ORAL | 0 refills | Status: DC | PRN

## 2022-02-02 MED ORDER — SULFAMETHOXAZOLE-TRIMETHOPRIM 800-160 MG PO TABS: 1.0000 | ORAL_TABLET | Freq: Two times a day (BID) | ORAL | 0 refills | Status: DC

## 2022-02-02 NOTE — Progress Notes (Signed)
AVS and discharge instructions reviewed w/ patient. Patient verbalized understanding and had no further questions. 

## 2022-02-02 NOTE — Discharge Summary (Signed)
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   Change in bowel movement 10/13/2021   Morbid obesity with BMI of 50.0-59.9, adult ([place]) 04/10/2016   GERD (gastroesophageal reflux disease) 04/10/2016   History of CVA (cerebrovascular accident) without residual deficits 04/10/2016   Diabetes mellitus type 2 in obese ([facility] [facility]) 04/10/2016   OSA on CPAP 04/10/2016   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 Briefly, Jill [name] is a 56 y.o. year old patient with large right-sided kidney stone burden.   Hospital Course: Patient tolerated the procedure well.  She was then transferred to the floor after an uneventful PACU stay.  Her hospital course was uncomplicated.  On POD#1 she had met discharge criteria: was eating a regular diet, was up and ambulating independently,  pain was well controlled, was voiding without a catheter, and was ready to for discharge.  The patient had a nephrostomy tube removed on postoperative day #1.  Her hemoglobin is stable.  She was voiding on her own.   Laboratory values:  Recent Labs    02/01/22 1654 02/02/22 0518 02/02/22 1022  WBC 13.7* 11.9* 13.6*  HGB 11.9* 9.4* 10.8*  HCT 36.4 28.8*  33.0*   Recent Labs    02/01/22 1432 02/02/22 0518  NA 139 140  K 4.3 3.4*  CL 112* 110  CO2 18* 23  GLUCOSE 174* 100*  BUN 20 20  CREATININE 1.01* 0.82  CALCIUM 10.0 8.9   No results for input(s): "LABPT", "INR" in the last 72 hours. No results for input(s): "LABURIN" in the last 72 hours. Results for orders placed or performed during the hospital encounter of 01/23/22  Urine Culture     Status: None   Collection Time: 01/23/22 10:32 AM   Specimen: Urine, Clean Catch  Result Value Ref Range Status   Specimen Description   Final    URINE, CLEAN CATCH Performed at [facility], [place] [address] [place], [place] [address]    Special Requests   Final    NONE Performed at [facility], [place] [address], [place], [place] [address]    Culture   Final    NO GROWTH Performed at [facility], [place] [address] [place],  [address]    Report Status 01/24/2022 FINAL  Final    Disposition: Home  Discharge instruction: The patient was instructed to be ambulatory but told to refrain from heavy lifting, strenuous activity, or driving.   Discharge medications:  Allergies as of 02/02/2022       Reactions   Dilaudid [hydromorphone Hcl] Nausea And Vomiting   Crab [shellfish Allergy] Swelling   Other Other (See Comments)   "pt is a difficult IV stick, prefers IV team paged for IV starts"        Medication List  STOP taking these medications    clopidogrel 75 MG tablet Commonly known as: PLAVIX       TAKE these medications    acetaminophen 500 MG tablet Commonly known as: TYLENOL Take 500-1,000 mg by mouth every 8 (eight) hours as needed for moderate pain or headache.   aspirin EC 81 MG tablet Take 81 mg by mouth every morning.   BARIATRIC MULTIVITAMINS/IRON PO Take 1 tablet by mouth daily.   bismuth subsalicylate 595 MG chewable tablet Commonly known as: PEPTO BISMOL Chew 524 mg by mouth 3 (three) times daily as  needed (stomach pain.).   CITRACAL +D3 PO Take 1 tablet by mouth in the morning and at bedtime.   docusate sodium 100 MG capsule Commonly known as: COLACE Take 100 mg by mouth in the morning.   felodipine 5 MG 24 hr tablet Commonly known as: PLENDIL Take 5 mg by mouth in the morning.   furosemide 40 MG tablet Commonly known as: LASIX Take 40 mg by mouth daily.   gabapentin 300 MG capsule Commonly known as: NEURONTIN Take 300 mg by mouth 2 (two) times daily.   ibuprofen 800 MG tablet Commonly known as: ADVIL Take 1 tablet (800 mg total) by mouth every 8 (eight) hours as needed.   lamoTRIgine 100 MG tablet Commonly known as: LAMICTAL Take 100 mg by mouth every morning.   levETIRAcetam 500 MG tablet Commonly known as: KEPPRA Take 250 mg by mouth 2 (two) times daily.   LORazepam 0.5 MG tablet Commonly known as: ATIVAN Take 0.5 mg by mouth daily as needed for anxiety.   metFORMIN 1000 MG tablet Commonly known as: GLUCOPHAGE Take 1,000 mg by mouth 2 (two) times daily.   metoprolol succinate 25 MG 24 hr tablet Commonly known as: TOPROL-XL Take 1 tablet by mouth daily   olmesartan 40 MG tablet Commonly known as: BENICAR Take 40 mg by mouth every morning.   Ozempic (1 MG/DOSE) 2 MG/1.5ML Sopn Generic drug: Semaglutide (1 MG/DOSE) Inject 1 mg into the skin once a week.   pantoprazole 40 MG tablet Commonly known as: PROTONIX Take 1 tablet (40 mg total) by mouth 2 (two) times daily.   pioglitazone 30 MG tablet Commonly known as: ACTOS Take 30 mg by mouth every morning.   potassium chloride 10 MEQ tablet Commonly known as: KLOR-CON M TAKE 2 TABLETS BY MOUTH DAILY.   rosuvastatin 40 MG tablet Commonly known as: CRESTOR Take 1 tablet (40 mg total) by mouth at bedtime.   sulfamethoxazole-trimethoprim 800-160 MG tablet Commonly known as: BACTRIM DS Take 1 tablet by mouth 2 (two) times daily.   traMADol 50 MG tablet Commonly known as: Ultram Take 1-2 tablets  (50-100 mg total) by mouth every 6 (six) hours as needed for moderate pain.   Vitamin B-12 5000 MCG Subl Place 5,000 mcg under the tongue every 3 (three) days.   VITAMIN K PO Take 1 tablet by mouth 3 (three) times a week.        Followup:   Follow-up Information     Ardis Hughs, MD Follow up on 02/15/2022.   Specialty: Urology Why: left sided stone surgery Contact information: [place] [address] [phone]

## 2022-02-02 NOTE — Progress Notes (Signed)
Urology Inpatient Progress Report  Nephrolithiasis [N20.0]  Procedure(s): RIGHT NEPHROLITHOTOMY PERCUTANEOUS WITH SURGEON ACCESS  1 Day Post-Op   Intv/Subj: No acute events overnight. Patient is without complaint.  Principal Problem:   Nephrolithiasis  Current Facility-Administered Medications  Medication Dose Route Frequency Provider Last Rate Last Admin   acetaminophen (OFIRMEV) IV 1,000 mg  1,000 mg Intravenous Q6H Ardis Hughs, MD   Stopped at 02/02/22 0335   felodipine (PLENDIL) 24 hr tablet 5 mg  5 mg Oral Daily Ardis Hughs, MD       furosemide (LASIX) tablet 40 mg  40 mg Oral Daily Ardis Hughs, MD       gabapentin (NEURONTIN) capsule 300 mg  300 mg Oral BID Ardis Hughs, MD   300 mg at 02/01/22 2113   hydrALAZINE (APRESOLINE) injection 5 mg  5 mg Intravenous Q4H PRN Ardis Hughs, MD       insulin aspart (novoLOG) injection 0-20 Units  0-20 Units Subcutaneous Q4H Ardis Hughs, MD   4 Units at 02/01/22 2113   irbesartan (AVAPRO) tablet 300 mg  300 mg Oral Daily Ardis Hughs, MD       lamoTRIgine (LAMICTAL) tablet 100 mg  100 mg Oral q morning Ardis Hughs, MD       levETIRAcetam (KEPPRA) tablet 250 mg  250 mg Oral BID Ardis Hughs, MD   250 mg at 02/01/22 2113   LORazepam (ATIVAN) tablet 0.5 mg  0.5 mg Oral Daily PRN Ardis Hughs, MD       metoprolol succinate (TOPROL-XL) 24 hr tablet 25 mg  25 mg Oral Daily Ardis Hughs, MD       morphine (PF) 2 MG/ML injection 2 mg  2 mg Intravenous Q1H PRN Ardis Hughs, MD       pantoprazole (PROTONIX) EC tablet 40 mg  40 mg Oral BID Ardis Hughs, MD   40 mg at 02/01/22 2113   potassium chloride SA (KLOR-CON M) CR tablet 20 mEq  20 mEq Oral Daily Ardis Hughs, MD       traMADol [facility]at[facility]) tablet 50-100 mg  50-100 mg Oral Q6H PRN Ardis Hughs, MD       zolpidem [facility]) tablet 5 mg  5 mg Oral QHS PRN,MR X 1 Ardis Hughs, MD   5 mg  at 02/01/22 2113     Objective: Vital: Vitals:   02/01/22 1823 02/01/22 2301 02/02/22 0354 02/02/22 0658  BP: 123/62 116/61 (!) 142/67 (!) 145/117  Pulse: 61 68 69 63  Resp: '15 18 18 18  '$ Temp: 98.4 F (36.9 C) 98.3 F (36.8 C) 98.2 F (36.8 C) 98.6 F (37 C)  TempSrc: Oral Oral Oral Oral  SpO2: 99% 97% 100% 100%  Weight:      Height:       I/Os: I/O last 3 completed shifts: In: 3659.2 [I.V.:3359.2; IV [id number]:829] Out: 2200 [Urine:1800; Blood:400]  Physical Exam:  General: Patient is in no apparent distress Lungs: Normal respiratory effort, chest expands symmetrically. GI: Incisions are c/d/i. Neph tube removed Foley: straw colored  Ext: lower extremities symmetric  Lab Results: Recent Labs    02/01/22 1654 02/02/22 0518  WBC 13.7* 11.9*  HGB 11.9* 9.4*  HCT 36.4 28.8*   Recent Labs    02/01/22 1432 02/02/22 0518  NA 139 140  K 4.3 3.4*  CL 112* 110  CO2 18* 23  GLUCOSE 174* 100*  BUN 20 20  CREATININE 1.01*  0.82  CALCIUM 10.0 8.9   No results for input(s): "LABPT", "INR" in the last 72 hours. No results for input(s): "LABURIN" in the last 72 hours. Results for orders placed or performed during the hospital encounter of 01/23/22  Urine Culture     Status: None   Collection Time: 01/23/22 10:32 AM   Specimen: Urine, Clean Catch  Result Value Ref Range Status   Specimen Description   Final    URINE, CLEAN CATCH Performed at [facility], [place] [address] [place], [place] [address]    Special Requests   Final    NONE Performed at [facility], [place] [address] [place], [place] [address]    Culture   Final    NO GROWTH Performed at [facility], [place] [address], [place], [place] [address]    Report Status 01/24/2022 FINAL  Final    Studies/Results: No results found.  Assessment: Procedure(s): RIGHT NEPHROLITHOTOMY PERCUTANEOUS WITH SURGEON ACCESS, 1 Day Post-Op  doing well.  Plan: Repeat hgb at  [place] D/c foley at 8:30 Likely home this AM.   Louis Meckel, MD Urology 02/02/2022, 7:02 AM

## 2022-02-02 NOTE — Plan of Care (Signed)
  Problem: Education: Goal: Ability to describe self-care measures that may prevent or decrease complications (Diabetes Survival Skills Education) will improve Outcome: Progressing   Problem: Coping: Goal: Ability to adjust to condition or change in health will improve Outcome: Progressing   Problem: Fluid Volume: Goal: Ability to maintain a balanced intake and output will improve Outcome: Progressing   Problem: Skin Integrity: Goal: Risk for impaired skin integrity will decrease Outcome: Progressing   Problem: Activity: Goal: Risk for activity intolerance will decrease Outcome: Progressing

## 2022-02-02 NOTE — Discharge Instructions (Signed)
Discharge instructions following PCNL  Call your doctor for: Fevers greater than 100.5 Severe nausea or vomiting Increasing pain not controlled by pain medication Increasing redness or drainage from incisions Decreased urine output or a catheter is no longer draining  The number for questions is [phone].  Activity: Gradually increase activity with short frequent walks, 3-4 times a day.  Avoid strenuous activities, like sports, lawn-mowing, or heavy lifting (more than 10-15 pounds).  Wear loose, comfortable clothing that pull or kink the tube or tubes.  Do not drive while taking pain medication, or until your doctor permitts it.  Bathing and dressing changes: You should not shower for 48 hours after surgery.  Do not soak your back in a bathtub.   Diet: It is extremely important to drink plenty of fluids after surgery, especially water.  You may resume your regular diet, unless otherwise instructed.  Medications: May take Tylenol (acetaminophen) or ibuprofen (Advil, Motrin) as directed over-the-counter. Take any prescriptions as directed. Hold Plavix until second surgery complete Continue Aspirin until 3 days prior to your next surgery (last dose July 22nd)  Follow-up appointments: Follow-up left sided stone surgery in 2 weeks.

## 2022-02-07 ENCOUNTER — Encounter (HOSPITAL_COMMUNITY): Payer: Self-pay | Admitting: Urology

## 2022-02-07 NOTE — Progress Notes (Addendum)
PCP - Iona Beard, MD Cardiologist - Carlyle Dolly, MD [place], MD   Chest x-ray - N/A EKG - 10-04-21 Epic Stress Test - greater than 2 years Epic ECHO - greater than 2 years Epic Cardiac Cath - greater than 2 years Pacemaker/ICD device last checked: N/A Spinal Cord Stimulator:  N/A   Bowel Prep - N/A   Sleep Study - Yes, +sleep apnea CPAP - Stopped using two months ago   Fasting Blood Sugar - 120's Checks Blood Sugar - once a week  Hgb A1c 5.4 01/23/22 in epic   Blood Thinner Instructions: Plavix stopped 01/26/22 has not resumed Aspirin Instructions:  ASA 81 will stop 02/10/22 Last Dose:   Activity level:   Can go up a flight of stairs and perform activities of daily living without stopping and without symptoms of chest pain or shortness of breath.   Anesthesia review:  CAD, PAD, PFO (on TEE 2009), carotid stenosis, HTN, OSA, DM.  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  Hx of seizures (one in lifetime). Hx of DVT.    Patient denies shortness of breath, fever, cough and chest pain at PAT appointment   Patient verbalized understanding of instructions that were given to them at the PAT appointment. Patient was also instructed that they will need to review over the PAT instructions again at home before surgery.

## 2022-02-08 LAB — CALCULI, WITH PHOTOGRAPH (CLINICAL LAB)
Calcium Oxalate Dihydrate: 70 %
Calcium Oxalate Monohydrate: 30 %
Weight Calculi: 437 mg

## 2022-02-10 ENCOUNTER — Other Ambulatory Visit: Payer: Self-pay

## 2022-02-10 ENCOUNTER — Encounter (HOSPITAL_COMMUNITY): Payer: Self-pay | Admitting: Urology

## 2022-02-15 ENCOUNTER — Ambulatory Visit (HOSPITAL_COMMUNITY): Payer: BC Managed Care – PPO

## 2022-02-15 ENCOUNTER — Encounter (HOSPITAL_COMMUNITY): Payer: Self-pay | Admitting: Urology

## 2022-02-15 ENCOUNTER — Observation Stay (HOSPITAL_COMMUNITY)
Admission: RE | Admit: 2022-02-15 | Discharge: 2022-02-16 | Disposition: A | Discharge status: Home / Self Care | Payer: BC Managed Care – PPO | Source: Ambulatory Visit | Attending: Urology | Admitting: Urology

## 2022-02-15 ENCOUNTER — Other Ambulatory Visit: Payer: Self-pay

## 2022-02-15 ENCOUNTER — Ambulatory Visit (HOSPITAL_COMMUNITY): Payer: BC Managed Care – PPO | Admitting: Physician Assistant

## 2022-02-15 ENCOUNTER — Encounter (HOSPITAL_COMMUNITY)
Admission: RE | Disposition: A | Discharge status: Home / Self Care | Payer: Self-pay | Source: Ambulatory Visit | Attending: Urology

## 2022-02-15 DIAGNOSIS — Z7982 Long term (current) use of aspirin: Secondary | ICD-10-CM | POA: Insufficient documentation

## 2022-02-15 DIAGNOSIS — Z8673 Personal history of transient ischemic attack (TIA), and cerebral infarction without residual deficits: Secondary | ICD-10-CM | POA: Diagnosis not present

## 2022-02-15 DIAGNOSIS — Z86718 Personal history of other venous thrombosis and embolism: Secondary | ICD-10-CM | POA: Diagnosis not present

## 2022-02-15 DIAGNOSIS — E119 Type 2 diabetes mellitus without complications: Secondary | ICD-10-CM | POA: Diagnosis not present

## 2022-02-15 DIAGNOSIS — N200 Calculus of kidney: Secondary | ICD-10-CM | POA: Diagnosis present

## 2022-02-15 DIAGNOSIS — Z7984 Long term (current) use of oral hypoglycemic drugs: Secondary | ICD-10-CM | POA: Diagnosis not present

## 2022-02-15 DIAGNOSIS — E1169 Type 2 diabetes mellitus with other specified complication: Secondary | ICD-10-CM

## 2022-02-15 DIAGNOSIS — Z7902 Long term (current) use of antithrombotics/antiplatelets: Secondary | ICD-10-CM | POA: Diagnosis not present

## 2022-02-15 DIAGNOSIS — Z79899 Other long term (current) drug therapy: Secondary | ICD-10-CM | POA: Insufficient documentation

## 2022-02-15 DIAGNOSIS — D649 Anemia, unspecified: Secondary | ICD-10-CM

## 2022-02-15 DIAGNOSIS — I10 Essential (primary) hypertension: Secondary | ICD-10-CM | POA: Insufficient documentation

## 2022-02-15 DIAGNOSIS — E669 Obesity, unspecified: Secondary | ICD-10-CM

## 2022-02-15 HISTORY — DX: Anemia, unspecified: D64.9

## 2022-02-15 HISTORY — PX: NEPHROLITHOTOMY: SHX5134

## 2022-02-15 LAB — BASIC METABOLIC PANEL
Anion gap: 8 (ref 5–15)
BUN: 29 mg/dL — ABNORMAL HIGH (ref 6–20)
CO2: 27 mmol/L (ref 22–32)
Calcium: 11 mg/dL — ABNORMAL HIGH (ref 8.9–10.3)
Chloride: 107 mmol/L (ref 98–111)
Creatinine, Ser: 0.89 mg/dL (ref 0.44–1.00)
GFR, Estimated: >60 mL/min (ref >60)
Glucose, Bld: 119 mg/dL — ABNORMAL HIGH (ref 70–99)
Potassium: 4.4 mmol/L (ref 3.5–5.1)
Sodium: 142 mmol/L (ref 135–145)

## 2022-02-15 LAB — CBC
HCT: 38.1 % (ref 36.0–46.0)
Hemoglobin: 12.3 g/dL (ref 12.0–15.0)
MCH: 31.5 pg (ref 26.0–34.0)
MCHC: 32.3 g/dL (ref 30.0–36.0)
MCV: 97.4 fL (ref 80.0–100.0)
Platelets: 315 10*3/uL (ref 150–400)
RBC: 3.91 MIL/uL (ref 3.87–5.11)
RDW: 12.4 % (ref 11.5–15.5)
WBC: 9.8 10*3/uL (ref 4.0–10.5)
nRBC: 0 % (ref 0.0–0.2)

## 2022-02-15 LAB — HEMOGLOBIN AND HEMATOCRIT, BLOOD
HCT: 32.7 % — ABNORMAL LOW (ref 36.0–46.0)
Hemoglobin: 10.5 g/dL — ABNORMAL LOW (ref 12.0–15.0)

## 2022-02-15 LAB — GLUCOSE, CAPILLARY
Glucose-Capillary: 125 mg/dL — ABNORMAL HIGH (ref 70–99)
Glucose-Capillary: 184 mg/dL — ABNORMAL HIGH (ref 70–99)
Glucose-Capillary: 146 mg/dL — ABNORMAL HIGH (ref 70–99)

## 2022-02-15 SURGERY — NEPHROLITHOTOMY PERCUTANEOUS
Anesthesia: General | Laterality: Left

## 2022-02-15 MED ORDER — ACETAMINOPHEN 325 MG PO TABS: 325.0000 mg | ORAL_TABLET | Freq: Once | ORAL | Status: DC | PRN

## 2022-02-15 MED ORDER — BUPIVACAINE-EPINEPHRINE 0.5% -1:200000 IJ SOLN
INTRAMUSCULAR | Status: DC | PRN
  Administered 2022-02-15: 30 mL

## 2022-02-15 MED ORDER — LIDOCAINE HCL (PF) 2 % IJ SOLN
INTRAMUSCULAR | Status: AC
  Filled 2022-02-15: qty 5

## 2022-02-15 MED ORDER — LEVETIRACETAM 250 MG PO TABS
250.0000 mg | ORAL_TABLET | Freq: Two times a day (BID) | ORAL | Status: DC
  Administered 2022-02-15: 250 mg via ORAL
  Filled 2022-02-15 (×2): qty 1

## 2022-02-15 MED ORDER — IRBESARTAN 300 MG PO TABS: 300.0000 mg | ORAL_TABLET | Freq: Every day | ORAL | Status: DC

## 2022-02-15 MED ORDER — TRAMADOL HCL 50 MG PO TABS: 50.0000 mg | ORAL_TABLET | Freq: Four times a day (QID) | ORAL | Status: DC | PRN

## 2022-02-15 MED ORDER — HYDRALAZINE HCL 20 MG/ML IJ SOLN: 5.0000 mg | INTRAMUSCULAR | Status: DC | PRN

## 2022-02-15 MED ORDER — POTASSIUM CHLORIDE CRYS ER 20 MEQ PO TBCR
20.0000 meq | EXTENDED_RELEASE_TABLET | Freq: Every day | ORAL | Status: DC
Start: 2022-02-16 — End: 2022-02-16

## 2022-02-15 MED ORDER — ROCURONIUM BROMIDE 10 MG/ML (PF) SYRINGE
PREFILLED_SYRINGE | INTRAVENOUS | Status: AC
  Filled 2022-02-15: qty 10

## 2022-02-15 MED ORDER — HYDROMORPHONE HCL 1 MG/ML IJ SOLN: 0.2500 mg | INTRAMUSCULAR | Status: DC | PRN

## 2022-02-15 MED ORDER — PHENYLEPHRINE 80 MCG/ML (10ML) SYRINGE FOR IV PUSH (FOR BLOOD PRESSURE SUPPORT)
PREFILLED_SYRINGE | INTRAVENOUS | Status: AC
  Filled 2022-02-15: qty 10

## 2022-02-15 MED ORDER — CHLORHEXIDINE GLUCONATE 0.12 % MT SOLN
15.0000 mL | Freq: Once | OROMUCOSAL | Status: AC
  Administered 2022-02-15: 15 mL via OROMUCOSAL

## 2022-02-15 MED ORDER — LACTATED RINGERS IV SOLN: INTRAVENOUS | Status: DC

## 2022-02-15 MED ORDER — EPHEDRINE SULFATE-NACL 50-0.9 MG/10ML-% IV SOSY
PREFILLED_SYRINGE | INTRAVENOUS | Status: DC | PRN
  Administered 2022-02-15 (×2): 5 mg via INTRAVENOUS
  Administered 2022-02-15: 10 mg via INTRAVENOUS

## 2022-02-15 MED ORDER — EPHEDRINE 5 MG/ML INJ
INTRAVENOUS | Status: AC
  Filled 2022-02-15: qty 5

## 2022-02-15 MED ORDER — FENTANYL CITRATE (PF) 250 MCG/5ML IJ SOLN
INTRAMUSCULAR | Status: AC
  Filled 2022-02-15: qty 5

## 2022-02-15 MED ORDER — FENTANYL CITRATE (PF) 100 MCG/2ML IJ SOLN
INTRAMUSCULAR | Status: DC | PRN
Start: 2022-02-15 — End: 2022-02-15
  Administered 2022-02-15: 100 ug via INTRAVENOUS
  Administered 2022-02-15 (×2): 50 ug via INTRAVENOUS

## 2022-02-15 MED ORDER — BUPIVACAINE-EPINEPHRINE (PF) 0.5% -1:200000 IJ SOLN
INTRAMUSCULAR | Status: AC
  Filled 2022-02-15: qty 30

## 2022-02-15 MED ORDER — SUGAMMADEX SODIUM 200 MG/2ML IV SOLN
INTRAVENOUS | Status: DC | PRN
  Administered 2022-02-15: 200 mg via INTRAVENOUS

## 2022-02-15 MED ORDER — INSULIN ASPART 100 UNIT/ML IJ SOLN: 0.0000 [IU] | INTRAMUSCULAR | Status: DC

## 2022-02-15 MED ORDER — PROMETHAZINE HCL 25 MG/ML IJ SOLN: 6.2500 mg | INTRAMUSCULAR | Status: DC | PRN

## 2022-02-15 MED ORDER — CEFAZOLIN SODIUM-DEXTROSE 2-4 GM/100ML-% IV SOLN
2.0000 g | INTRAVENOUS | Status: AC
  Administered 2022-02-15: 2 g via INTRAVENOUS
  Filled 2022-02-15: qty 100

## 2022-02-15 MED ORDER — ONDANSETRON HCL 4 MG/2ML IJ SOLN
INTRAMUSCULAR | Status: DC | PRN
  Administered 2022-02-15: 4 mg via INTRAVENOUS

## 2022-02-15 MED ORDER — 0.9 % SODIUM CHLORIDE (POUR BTL) OPTIME
TOPICAL | Status: DC | PRN
  Administered 2022-02-15: 1000 mL

## 2022-02-15 MED ORDER — DEXAMETHASONE SODIUM PHOSPHATE 4 MG/ML IJ SOLN
INTRAMUSCULAR | Status: DC | PRN
  Administered 2022-02-15: 5 mg via INTRAVENOUS

## 2022-02-15 MED ORDER — ORAL CARE MOUTH RINSE: 15.0000 mL | Freq: Once | OROMUCOSAL | Status: AC

## 2022-02-15 MED ORDER — FUROSEMIDE 40 MG PO TABS: 40.0000 mg | ORAL_TABLET | Freq: Every day | ORAL | Status: DC

## 2022-02-15 MED ORDER — PHENYLEPHRINE 80 MCG/ML (10ML) SYRINGE FOR IV PUSH (FOR BLOOD PRESSURE SUPPORT)
PREFILLED_SYRINGE | INTRAVENOUS | Status: DC | PRN
  Administered 2022-02-15 (×2): 120 ug via INTRAVENOUS

## 2022-02-15 MED ORDER — ACETAMINOPHEN 10 MG/ML IV SOLN
INTRAVENOUS | Status: AC
  Administered 2022-02-15: 1000 mg via INTRAVENOUS
  Filled 2022-02-15: qty 100

## 2022-02-15 MED ORDER — MIDAZOLAM HCL 2 MG/2ML IJ SOLN
INTRAMUSCULAR | Status: AC
  Filled 2022-02-15: qty 2

## 2022-02-15 MED ORDER — PROPOFOL 10 MG/ML IV BOLUS
INTRAVENOUS | Status: DC | PRN
  Administered 2022-02-15: 150 mg via INTRAVENOUS

## 2022-02-15 MED ORDER — MIDAZOLAM HCL 5 MG/5ML IJ SOLN
INTRAMUSCULAR | Status: DC | PRN
  Administered 2022-02-15: 2 mg via INTRAVENOUS

## 2022-02-15 MED ORDER — ACETAMINOPHEN 160 MG/5ML PO SOLN: 325.0000 mg | Freq: Once | ORAL | Status: DC | PRN

## 2022-02-15 MED ORDER — MEPERIDINE HCL 50 MG/ML IJ SOLN: 6.2500 mg | INTRAMUSCULAR | Status: DC | PRN

## 2022-02-15 MED ORDER — ROCURONIUM BROMIDE 10 MG/ML (PF) SYRINGE
PREFILLED_SYRINGE | INTRAVENOUS | Status: DC | PRN
  Administered 2022-02-15: 10 mg via INTRAVENOUS
  Administered 2022-02-15: 20 mg via INTRAVENOUS
  Administered 2022-02-15: 100 mg via INTRAVENOUS

## 2022-02-15 MED ORDER — INSULIN ASPART 100 UNIT/ML IJ SOLN
0.0000 [IU] | INTRAMUSCULAR | Status: DC
  Administered 2022-02-15: 3 [IU] via SUBCUTANEOUS

## 2022-02-15 MED ORDER — DEXAMETHASONE SODIUM PHOSPHATE 10 MG/ML IJ SOLN
INTRAMUSCULAR | Status: AC
  Filled 2022-02-15: qty 1

## 2022-02-15 MED ORDER — AMISULPRIDE (ANTIEMETIC) 5 MG/2ML IV SOLN: 10.0000 mg | Freq: Once | INTRAVENOUS | Status: DC | PRN

## 2022-02-15 MED ORDER — ACETAMINOPHEN 10 MG/ML IV SOLN
1000.0000 mg | Freq: Four times a day (QID) | INTRAVENOUS | Status: DC
  Administered 2022-02-16: 1000 mg via INTRAVENOUS
  Filled 2022-02-15: qty 100

## 2022-02-15 MED ORDER — METOPROLOL SUCCINATE ER 25 MG PO TB24: 25.0000 mg | ORAL_TABLET | Freq: Every day | ORAL | Status: DC

## 2022-02-15 MED ORDER — GENTAMICIN SULFATE 40 MG/ML IJ SOLN
360.0000 mg | INTRAVENOUS | Status: AC
  Administered 2022-02-15: 360 mg via INTRAVENOUS
  Filled 2022-02-15: qty 9

## 2022-02-15 MED ORDER — SODIUM CHLORIDE 0.45 % IV SOLN: INTRAVENOUS | Status: DC

## 2022-02-15 MED ORDER — ACETAMINOPHEN 10 MG/ML IV SOLN: 1000.0000 mg | Freq: Once | INTRAVENOUS | Status: DC | PRN

## 2022-02-15 MED ORDER — CEPHALEXIN 500 MG PO CAPS
500.0000 mg | ORAL_CAPSULE | Freq: Four times a day (QID) | ORAL | Status: DC
  Administered 2022-02-15 – 2022-02-16 (×2): 500 mg via ORAL
  Filled 2022-02-15 (×3): qty 1

## 2022-02-15 MED ORDER — LORAZEPAM 0.5 MG PO TABS: 0.5000 mg | ORAL_TABLET | Freq: Every day | ORAL | Status: DC | PRN

## 2022-02-15 MED ORDER — LIDOCAINE 2% (20 MG/ML) 5 ML SYRINGE
INTRAMUSCULAR | Status: DC | PRN
  Administered 2022-02-15: 40 mg via INTRAVENOUS

## 2022-02-15 MED ORDER — LAMOTRIGINE 100 MG PO TABS: 100.0000 mg | ORAL_TABLET | Freq: Every morning | ORAL | Status: DC

## 2022-02-15 MED ORDER — GABAPENTIN 300 MG PO CAPS
300.0000 mg | ORAL_CAPSULE | Freq: Two times a day (BID) | ORAL | Status: DC
  Administered 2022-02-15: 300 mg via ORAL
  Filled 2022-02-15: qty 1

## 2022-02-15 MED ORDER — SODIUM CHLORIDE 0.9 % IR SOLN
Status: DC | PRN
  Administered 2022-02-15: 27000 mL

## 2022-02-15 MED ORDER — ONDANSETRON HCL 4 MG/2ML IJ SOLN
INTRAMUSCULAR | Status: AC
  Filled 2022-02-15: qty 2

## 2022-02-15 MED ORDER — DOCUSATE SODIUM 100 MG PO CAPS: 100.0000 mg | ORAL_CAPSULE | Freq: Every day | ORAL | Status: DC

## 2022-02-15 MED ORDER — PROPOFOL 10 MG/ML IV BOLUS
INTRAVENOUS | Status: AC
  Filled 2022-02-15: qty 20

## 2022-02-15 MED ORDER — PANTOPRAZOLE SODIUM 40 MG PO TBEC
40.0000 mg | DELAYED_RELEASE_TABLET | Freq: Two times a day (BID) | ORAL | Status: DC
  Administered 2022-02-15: 40 mg via ORAL
  Filled 2022-02-15: qty 1

## 2022-02-15 SURGICAL SUPPLY — 66 items
BAG COUNTER SPONGE SURGICOUNT (BAG) IMPLANT
BAG URINE DRAIN 2000ML AR STRL (UROLOGICAL SUPPLIES) ×1 IMPLANT
BASKET STONE NCOMPASS (UROLOGICAL SUPPLIES) IMPLANT
BASKET ZERO TIP NITINOL 2.4FR (BASKET) ×1 IMPLANT
BENZOIN TINCTURE PRP APPL 2/3 (GAUZE/BANDAGES/DRESSINGS) ×2 IMPLANT
BLADE SURG 15 STRL LF DISP TIS (BLADE) ×1 IMPLANT
BLADE SURG 15 STRL SS (BLADE) ×2
BOOTIES KNEE HIGH SLOAN (MISCELLANEOUS) ×1 IMPLANT
CATH 2WAY 30CC 24FR (CATHETERS) ×1 IMPLANT
CATH URETERAL DUAL LUMEN 10F (MISCELLANEOUS) ×2 IMPLANT
CATH URETL OPEN 5X70 (CATHETERS) ×2 IMPLANT
CATH UROLOGY TORQUE 40 (MISCELLANEOUS) IMPLANT
CATH UROLOGY TORQUE 65 (CATHETERS) ×1 IMPLANT
CATH X-FORCE N30 NEPHROSTOMY (TUBING) ×2 IMPLANT
CHLORAPREP W/TINT 26 (MISCELLANEOUS) ×2 IMPLANT
DRAPE C-ARM 42X120 X-RAY (DRAPES) ×2 IMPLANT
DRAPE LINGEMAN PERC (DRAPES) ×2 IMPLANT
DRAPE SURG IRRIG POUCH 19X23 (DRAPES) ×4 IMPLANT
DRSG PAD ABDOMINAL 8X10 ST (GAUZE/BANDAGES/DRESSINGS) IMPLANT
DRSG TEGADERM 4X4.75 (GAUZE/BANDAGES/DRESSINGS) ×1 IMPLANT
DRSG TEGADERM 8X12 (GAUZE/BANDAGES/DRESSINGS) ×4 IMPLANT
EXTRACTOR STONE 1.7FRX115CM (UROLOGICAL SUPPLIES) IMPLANT
GAUZE SPONGE 4X4 12PLY STRL (GAUZE/BANDAGES/DRESSINGS) IMPLANT
GLOVE SURG LX 7.5 STRW (GLOVE) ×1
GLOVE SURG LX STRL 7.5 STRW (GLOVE) ×1 IMPLANT
GOWN STRL REUS W/ TWL LRG LVL3 (GOWN DISPOSABLE) ×2 IMPLANT
GOWN STRL REUS W/ TWL XL LVL3 (GOWN DISPOSABLE) ×1 IMPLANT
GOWN STRL REUS W/TWL LRG LVL3 (GOWN DISPOSABLE) ×4
GOWN STRL REUS W/TWL XL LVL3 (GOWN DISPOSABLE) ×2
GUIDEWIRE AMPLAZ .035X145 (WIRE) ×4 IMPLANT
GUIDEWIRE ANG ZIPWIRE 038X150 (WIRE) ×1 IMPLANT
GUIDEWIRE STR DUAL SENSOR (WIRE) ×2 IMPLANT
HLDR NDL AMPLATZ W/INSERTS (MISCELLANEOUS) IMPLANT
HOLDER NEEDLE AMPLATZ W/INSERT (MISCELLANEOUS) ×2 IMPLANT
IV SET EXTENSION CATH 6 NF (IV SETS) ×2 IMPLANT
KIT BASIN OR (CUSTOM PROCEDURE TRAY) ×2 IMPLANT
KIT PROBE 340X3.4XDISP GRN (MISCELLANEOUS) IMPLANT
KIT PROBE TRILOGY 3.4X340 (MISCELLANEOUS)
KIT PROBE TRILOGY 3.9X350 (MISCELLANEOUS) ×1 IMPLANT
LASER FIB FLEXIVA PULSE ID 365 (Laser) IMPLANT
MANIFOLD NEPTUNE II (INSTRUMENTS) ×2 IMPLANT
NDL SPNL 20GX3.5 QUINCKE YW (NEEDLE) IMPLANT
NDL TROCAR 18X15 ECHO (NEEDLE) IMPLANT
NDL TROCAR 18X20 (NEEDLE) IMPLANT
NEEDLE SPNL 20GX3.5 QUINCKE YW (NEEDLE) IMPLANT
NEEDLE TROCAR 18X15 ECHO (NEEDLE) ×2 IMPLANT
NEEDLE TROCAR 18X20 (NEEDLE) IMPLANT
NS IRRIG 1000ML POUR BTL (IV SOLUTION) ×2 IMPLANT
PACK CYSTO (CUSTOM PROCEDURE TRAY) ×2 IMPLANT
SHEATH PEELAWAY SET 9 (SHEATH) IMPLANT
SPONGE GAUZE 2X2 8PLY STRL LF (GAUZE/BANDAGES/DRESSINGS) ×1 IMPLANT
SPONGE T-LAP 4X18 ~~LOC~~+RFID (SPONGE) ×2 IMPLANT
STENT URET 6FRX24 CONTOUR (STENTS) ×1 IMPLANT
SUT ETHILON 3 0 PS 1 (SUTURE) ×1 IMPLANT
SUT SILK 0 (SUTURE) ×2
SUT SILK 0 30XBRD TIE 6 (SUTURE) ×1 IMPLANT
SYR 10ML LL (SYRINGE) ×2 IMPLANT
SYR 20ML LL LF (SYRINGE) ×4 IMPLANT
SYR 50ML LL SCALE MARK (SYRINGE) ×2 IMPLANT
TOWEL OR 17X26 10 PK STRL BLUE (TOWEL DISPOSABLE) ×2 IMPLANT
TRACTIP FLEXIVA PULS ID 200XHI (Laser) IMPLANT
TRACTIP FLEXIVA PULSE ID 200 (Laser) ×1 IMPLANT
TRAY FOLEY MTR SLVR 16FR STAT (SET/KITS/TRAYS/PACK) ×2 IMPLANT
TUBING CONNECTING 10 (TUBING) ×4 IMPLANT
TUBING STONE CATCHER TRILOGY (MISCELLANEOUS) ×1 IMPLANT
TUBING UROLOGY SET (TUBING) ×2 IMPLANT

## 2022-02-15 NOTE — Transfer of Care (Signed)
Immediate Anesthesia Transfer of Care Note  Patient: Jill [name]  Procedure(s) Performed: LEFT NEPHROLITHOTOMY PERCUTANEOUS WITH SURGEON ACCESS, REMOVAL OF RIGHT URETERAL STENT (Left)  Patient Location: PACU  Anesthesia Type:General  Level of Consciousness: awake, alert  and oriented  Airway & Oxygen Therapy: Patient Spontanous Breathing and Patient connected to face mask oxygen  Post-op Assessment: Report given to RN and Post -op Vital signs reviewed and stable  Post vital signs: Reviewed and stable  Last Vitals:  Vitals Value Taken Time  BP 128/55 02/15/22 1616  Temp    Pulse 61 02/15/22 1616  Resp 4 02/15/22 1616  SpO2 100 % 02/15/22 1616  Vitals shown include unvalidated device data.  Last Pain:  Vitals:   02/15/22 1024  TempSrc: Oral         Complications: No notable events documented.

## 2022-02-15 NOTE — Anesthesia Procedure Notes (Addendum)
Procedure Name: Intubation Date/Time: 02/15/2022 12:34 PM  Performed by: Claudia Desanctis, CRNAPre-anesthesia Checklist: Patient identified, Emergency Drugs available, Suction available and Patient being monitored Patient Re-evaluated:Patient Re-evaluated prior to induction Oxygen Delivery Method: Circle system utilized Preoxygenation: Pre-oxygenation with 100% oxygen Induction Type: IV induction Ventilation: Mask ventilation without difficulty Laryngoscope Size: Miller and 3 Grade View: Grade I Tube type: Oral Number of attempts: 1 Airway Equipment and Method: Stylet Placement Confirmation: ETT inserted through vocal cords under direct vision, positive ETCO2 and breath sounds checked- equal and bilateral Secured at: 22 cm Tube secured with: Tape Dental Injury: Teeth and Oropharynx as per pre-operative assessment

## 2022-02-15 NOTE — Anesthesia Preprocedure Evaluation (Signed)
Anesthesia Evaluation  Patient identified by MRN, date of birth, ID band Patient awake    Reviewed: Allergy & Precautions, NPO status , Patient's Chart, lab work & pertinent test results, reviewed documented beta blocker date and time   History of Anesthesia Complications (+) PONV and history of anesthetic complications  Airway Mallampati: II  TM Distance: >3 FB Neck ROM: Full    Dental  (+) Teeth Intact, Dental Advisory Given   Pulmonary sleep apnea , Current Smoker,    breath sounds clear to auscultation       Cardiovascular hypertension, Pt. on medications and Pt. on home beta blockers + Valvular Problems/Murmurs  Rhythm:Regular Rate:Normal  Echo (2021) 1. Left ventricular ejection fraction, by estimation, is 60 to 65%. The  left ventricle has normal function. The left ventricle has no regional  wall motion abnormalities. There is moderate left ventricular hypertrophy.  Left ventricular diastolic  parameters are consistent with Grade I diastolic dysfunction (impaired  relaxation).  2. Right ventricular systolic function is normal. The right ventricular  size is normal.  3. The mitral valve is normal in structure. No evidence of mitral valve  regurgitation. No evidence of mitral stenosis.  4. The aortic valve is tricuspid. Aortic valve regurgitation is not  visualized. No aortic stenosis is present.  5. The inferior vena cava is dilated in size with >50% respiratory  variability, suggesting right atrial pressure of 8 mmHg.    Neuro/Psych Seizures -, Well Controlled,  Anxiety CVA, Residual Symptoms    GI/Hepatic Neg liver ROS, GERD  Medicated and Controlled,  Endo/Other  diabetes, Type 2, Insulin Dependent, Oral Hypoglycemic Agents  Renal/GU Renal disease     Musculoskeletal   Abdominal Normal abdominal exam  (+)   Peds  Hematology   Anesthesia Other Findings   Reproductive/Obstetrics                              Anesthesia Physical Anesthesia Plan  ASA: 3  Anesthesia Plan: General   Post-op Pain Management:    Induction: Intravenous  PONV Risk Score and Plan: 4 or greater and Ondansetron, Dexamethasone, Midazolam and Scopolamine patch - Pre-op  Airway Management Planned: Oral ETT  Additional Equipment: None  Intra-op Plan:   Post-operative Plan: Extubation in OR  Informed Consent: I have reviewed the patients History and Physical, chart, labs and discussed the procedure including the risks, benefits and alternatives for the proposed anesthesia with the patient or authorized representative who has indicated his/her understanding and acceptance.     Dental advisory given  Plan Discussed with: CRNA  Anesthesia Plan Comments: (- Paper tape for eyes. )        Anesthesia Quick Evaluation

## 2022-02-15 NOTE — Interval H&P Note (Signed)
History and Physical Interval Note:  02/15/2022 6:14 PM  Jill [name]  has presented today for surgery, with the diagnosis of LEFT RENAL STONES.  The various methods of treatment have been discussed with the patient and family. After consideration of risks, benefits and other options for treatment, the patient has consented to  Procedure(s) with comments: LEFT NEPHROLITHOTOMY PERCUTANEOUS WITH SURGEON ACCESS, REMOVAL OF RIGHT URETERAL STENT (Left) - 195 MINUTES as a surgical intervention.  The patient's history has been reviewed, patient examined, no change in status, stable for surgery.  I have reviewed the patient's chart and labs.  Questions were answered to the patient's satisfaction.     Ardis Hughs

## 2022-02-15 NOTE — Op Note (Signed)
Pre-operative diagnosis: left renal pelvis stones, >2cm Post-operative diagnosis: as above   Procedure performed: cystoscopy, right ureteral stent removal, left retrograde pyelogram with interpretation, left percutaneous renal access, left nephrolithotomy, left laser lithotripsy, left nephrostogram,  left ureteral stent placement    Surgeon: Dr. Ardis Hughs  Assistant: none  Anesthesia: General  Complications: None  Specimens: The majority of the stones were removed and will be sent to the [facility] lab for further analysis.  Findings: 1.  Right ureteral stent was removed prior to the placement of the left retrograde catheter. #2: Access was obtained in the left lower pole, all the stones were removed and the patient was noted to be stone free to the best of my visualization. #3: 24 cm x 6 French double-J stent was advanced antegrade into the bladder fluoroscopically.  EBL: Approximately 100 cc  Specimens: stone from collecting system - taken to [facility] lab  Indication: Jill [name] is a 56 y.o. patient with a history of nephrolithiasis with a large stone burden in the left kidney.  2 weeks prior the patient had undergone right PCNL and did very well, we plan to remove her stent under anesthesia during the left kidney surgery.  After reviewing the management options for treatment, he elected to proceed with the above surgical procedure(s). We have discussed the potential benefits and risks of the procedure, side effects of the proposed treatment, the likelihood of the patient achieving the goals of the procedure, and any potential problems that might occur during the procedure or recuperation. Informed consent has been obtained.   Description:  Consent was obtained in the preoperative holding area. The patient was marked appropriately and then taken back to the operating room where he was intubated on the gurney. The patient was flipped prone onto the  split leg OR table. Large jelly rolls were placed in the anterior axillary line on both sides allowing the patient's chest and abdomen to fall inbetween. The patient was then prepped and draped in the routine sterile fashion in the left flank and genital area. A timeout was then held confirming the proper side and procedure as well as antibiotics were administered.  I then used the flexible cystoscope and passed gently into the patient's urethra under visual guidance. Once into the bladder I grasped the stent emanating from the patient's right ureteral orifice and pull it to the urethral meatus, removing it completely. I then passed a wire into the left ureteral orifice and advanced up into the kidney under fluoroscopic guidance.  I then exchanged the wire for a 5 [place] open-ended ureteral Pollock catheter. The Pollack catheter was then advanced up to the UPJ. The wire was then removed and a retrograde pyelogram was performed with the above findings.  Foley catheter was in place.  I then turned my attention to the patient's left flank and obtaining percutaneous renal access.  Using the C-arm rotated at 25 and the bulls-eye technique with an 18-gauge coaxial needle the  lower posterior lateral calyx was targeted. Then rotating the C-arm AP depth of our needle was noted to be within the calyx and the inner part of the coaxial needle was removed. Urine was noted to return. A 0.038 sensor wire was then passed through the sheath of the coaxial needle and into the left renal collecting system. The wire was then passed down the ureter and into the bladder using fluoroscopic guide and the sheath of the needle was removed.  An angiographic catheter was  then advanced into the bladder and the wire removed.  A Super Stiff wire was then passed into the angiographic catheter and the angiographic catheter removed.  A dual-lumen catheter was then advanced over the wire and a second Super Stiff wire placed into the patient's  bladder under fluoroscopic guidance, establishing 2 superstiff wires through the targeted calyx and into the bladder.   The 69 French NephroMax balloon was then passed over one of the Super Stiff wires and the tip guided down into the targeted calyx. The balloon was then inflated to approximately 12 atm, and once there was no waist noted under fluoroscopy the access sheath was advanced over the balloon. The balloon was then removed. The wires were then placed back into the sheaths and snapped to the drape.   Using the rigid nephroscope to explore the targeted calyx and kidney.  The stones were easily located and removed with the trilogy LithoClast device. Then using a flexible cystoscope to navigate the remaining calyces of the kidney multiple smaller stone fragments encountered.  Was unable to pull the large stone in the upper pole down through the calyx because it was too big.  As such, I used to 365 m laser fiber and fragmented the stone into 4 pieces.  I was able to use the 0 tip basket to remove all of these fragments.   Using the 0 tip basket these fragments were grabbed and removed. Contrast was injected through the cystoscope and the calyces systematically inspected under fluoroscopic guidance to ensure that all stone fragments had been removed.   Then using the flexible cystoscope to clear the UPJ and proximal ureter.  A 0.038 sensor wire was advanced into  the bladder through the nephroscope and using the stent pusher and a  24 cm x 6 French double-J ureteral stent was passed antegrade over the sensor wire down into the bladder under fluoroscopic guidance. Once the stent was in the bladder the wire was gently pulled back and a nice curl noted in the bladder. The wire completely removed from the stent, and nice curl on the proximal end of the stent was noted in the renal pelvis. The sheath was then backed out slowly to ensure that all calyces had been inspected and there was nothing behind the  sheath.   A 18F ainsworth tip catheter was then passed over one of the Super Stiff wires through the sheath and into the renal pelvis. The sheath was then backed out of the kidney and cut over the red rubber catheter. A nephrostogram was then performed confirming the position of our nephrostomy tube and reassuring that there were no longer any filling defects from the patient's symptoms.  After several minutes of direct pressure and observation was noted that there was no significant bleeding from the nephrostomy tube or around the nephrostomy tube tract. As such, I remove the nephrostomy tube as well as the safety wire. 25 cc of local anesthesia was then injected into the patient's wound, and the wound was closed with 3-0 nylon in 2 vertical mattress sutures. The incision was then padded using a bundle of 2x2's and tegaderm. Patient was subsequently rolled over to the supine position and extubated. The patient was returned to the PACU in excellent condition. At the end of the case all lap and needle and sponges were accounted for. There are no perioperative complications.

## 2022-02-16 ENCOUNTER — Encounter (HOSPITAL_COMMUNITY): Payer: Self-pay | Admitting: Urology

## 2022-02-16 DIAGNOSIS — N200 Calculus of kidney: Secondary | ICD-10-CM | POA: Diagnosis not present

## 2022-02-16 LAB — CBC
HCT: 30.1 % — ABNORMAL LOW (ref 36.0–46.0)
Hemoglobin: 9.7 g/dL — ABNORMAL LOW (ref 12.0–15.0)
MCH: 31.5 pg (ref 26.0–34.0)
MCHC: 32.2 g/dL (ref 30.0–36.0)
MCV: 97.7 fL (ref 80.0–100.0)
Platelets: 234 10*3/uL (ref 150–400)
RBC: 3.08 MIL/uL — ABNORMAL LOW (ref 3.87–5.11)
RDW: 12.3 % (ref 11.5–15.5)
WBC: 13 10*3/uL — ABNORMAL HIGH (ref 4.0–10.5)
nRBC: 0 % (ref 0.0–0.2)

## 2022-02-16 LAB — GLUCOSE, CAPILLARY
Glucose-Capillary: 101 mg/dL — ABNORMAL HIGH (ref 70–99)
Glucose-Capillary: 77 mg/dL (ref 70–99)
Glucose-Capillary: 93 mg/dL (ref 70–99)

## 2022-02-16 LAB — BASIC METABOLIC PANEL
Anion gap: 7 (ref 5–15)
BUN: 28 mg/dL — ABNORMAL HIGH (ref 6–20)
CO2: 27 mmol/L (ref 22–32)
Calcium: 9.6 mg/dL (ref 8.9–10.3)
Chloride: 105 mmol/L (ref 98–111)
Creatinine, Ser: 1.23 mg/dL — ABNORMAL HIGH (ref 0.44–1.00)
GFR, Estimated: 51 mL/min — ABNORMAL LOW (ref >60)
Glucose, Bld: 96 mg/dL (ref 70–99)
Potassium: 4.4 mmol/L (ref 3.5–5.1)
Sodium: 139 mmol/L (ref 135–145)

## 2022-02-16 MED ORDER — CEPHALEXIN 500 MG PO CAPS: 500.0000 mg | ORAL_CAPSULE | Freq: Four times a day (QID) | ORAL | 0 refills | Status: AC

## 2022-02-16 NOTE — TOC Transition Note (Signed)
Transition of Care [facility]) - CM/SW Discharge Note   Patient Details  Name: AKEYLAH HENDEL MRN: [id number] Date of Birth: 12-30-1965  [facility]) CM/SW Contact:  Leeroy Cha, RN Phone Number: 02/16/2022, 9:09 AM   Clinical Narrative:    Ldischarged to return home with no toc needs.  Chart reviewed.     Barriers to Discharge: Continued Medical Work up   Patient Goals and CMS Choice Patient states their goals for this hospitalization and ongoing recovery are:: to go back to my home CMS Medicare.gov Compare Post Acute Care list provided to:: Patient Choice offered to / list presented to : Patient  Discharge Placement                       Discharge Plan and Services   Discharge Planning Services: CM Consult                                 Social Determinants of Health (SDOH) Interventions     Readmission Risk Interventions     No data to display

## 2022-02-16 NOTE — TOC Initial Note (Signed)
Transition of Care [facility]) - Initial/Assessment Note    Patient Details  Name: Jill [name] MRN: [id number] Date of Birth: 03-26-1966  Transition of Care [facility]) CM/SW Contact:    Leeroy Cha, RN Phone Number: 02/16/2022, 8:26 AM  Clinical Narrative:                  Transition of Care [facility]) Screening Note   Patient Details  Name: Jill [name] Date of Birth: 25-Jul-1965   Transition of Care [facility]) CM/SW Contact:    Leeroy Cha, RN Phone Number: 02/16/2022, 8:26 AM    Transition of Care Department [facility]) has reviewed patient and no TOC needs have been identified at this time. We will continue to monitor patient advancement through interdisciplinary progression rounds. If new patient transition needs arise, please place a TOC consult.    Expected Discharge Plan: Home/Self Care Barriers to Discharge: Continued Medical Work up   Patient Goals and CMS Choice Patient states their goals for this hospitalization and ongoing recovery are:: to go back to my home CMS Medicare.gov Compare Post Acute Care list provided to:: Patient Choice offered to / list presented to : Patient  Expected Discharge Plan and Services Expected Discharge Plan: Home/Self Care   Discharge Planning Services: CM Consult   Living arrangements for the past 2 months: Single Family Home                                      Prior Living Arrangements/Services Living arrangements for the past 2 months: Single Family Home Lives with:: Spouse Patient language and need for interpreter reviewed:: Yes Do you feel safe going back to the place where you live?: Yes            Criminal Activity/Legal Involvement Pertinent to Current Situation/Hospitalization: No - Comment as needed  Activities of Daily Living Home Assistive Devices/Equipment: CBG Meter, Eyeglasses, Blood pressure cuff ADL Screening (condition at time of admission) Patient's cognitive ability adequate to safely complete  daily activities?: Yes Is the patient deaf or have difficulty hearing?: No Does the patient have difficulty seeing, even when wearing glasses/contacts?: No Does the patient have difficulty concentrating, remembering, or making decisions?: No Patient able to express need for assistance with ADLs?: Yes Does the patient have difficulty dressing or bathing?: No Independently performs ADLs?: Yes (appropriate for developmental age) Does the patient have difficulty walking or climbing stairs?: No Weakness of Legs: None Weakness of Arms/Hands: None  Permission Sought/Granted                  Emotional Assessment Appearance:: Appears stated age Attitude/Demeanor/Rapport: Engaged Affect (typically observed): Calm Orientation: : Oriented to Self, Oriented to Place, Oriented to  Time, Oriented to Situation Alcohol / Substance Use: Never Used Psych Involvement: No (comment)  Admission diagnosis:  Nephrolithiasis [N20.0] Patient Active Problem List   Diagnosis Date Noted   Nephrolithiasis 02/01/2022   Gastritis 01/12/2022   Abdominal pain 10/13/2021   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   Change in bowel movement 10/13/2021   Morbid obesity with BMI of 50.0-59.9, adult ([place]) 04/10/2016   GERD (gastroesophageal reflux disease) 04/10/2016   History of CVA (cerebrovascular accident) without residual deficits 04/10/2016   Diabetes mellitus type 2 in obese ([place]) 04/10/2016   OSA on CPAP 04/10/2016   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   Carotid stenosis, symptomatic w/o infarct 10/14/2014   Abnormal stress test  Stenosis of artery (HCC)    CVA (cerebral vascular accident) ([place]) 06/12/2013   HTN (hypertension) 06/12/2013   Hypertriglyceridemia 06/12/2013   PFO (patent foramen ovale) 06/12/2013   PCP:  Iona Beard, MD Pharmacy:   [facility], [place] [address] [address] [place] [address] Phone: [phone] Fax:  [phone]  [facility], [place] [address] [place] [address] Phone: [phone] Fax: [phone]     Social Determinants of Health (SDOH) Interventions    Readmission Risk Interventions     No data to display

## 2022-02-16 NOTE — Discharge Summary (Signed)
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   Change in bowel movement 10/13/2021   Morbid obesity with BMI of 50.0-59.9, adult ([place]) 04/10/2016   GERD (gastroesophageal reflux disease) 04/10/2016   History of CVA (cerebrovascular accident) without residual deficits 04/10/2016   Diabetes mellitus type 2 in obese ([place]) 04/10/2016   OSA on CPAP 04/10/2016   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 Briefly, Jill [name] is a 56 y.o. year old patient with large bilateral kidney stone burden, we completed a right PCNL 2 weeks prior and she presents today for the left side.   Hospital Course: Patient tolerated the procedure well.  She was then transferred to the floor after an uneventful PACU stay.  Her hospital course was uncomplicated.  On POD#1 she had met discharge criteria: was eating a regular diet, was up and ambulating independently,  pain was well controlled, was voiding without a catheter, and was ready to for discharge.  PE on day of discharge: Vitals:   02/15/22 2200 02/16/22 0004 02/16/22 0314 02/16/22 0739  BP:  111/62 109/63 115/64  Pulse:  68 66 70  Resp:  16  18  Temp:  98.3 F (36.8 C) 98.3 F (36.8 C)    TempSrc:  Oral Oral   SpO2:  96% 98% 100%  Weight:      Height: 5' 6.5" (1.689 m)       Intake/Output Summary (Last 24 hours) at 02/16/2022 0839 Last data filed at 02/16/2022 0600 Gross per 24 hour  Intake 3754 ml  Output 1725 ml  Net 2029 ml   NAD Unlabored breathing on room air Abdomen soft, left flank incision is dressed which is c/d/I Extremities are symmetric Foley was discontinued  Laboratory values:  Recent Labs    02/15/22 1030 02/15/22 2022 02/16/22 0419  WBC 9.8  --  13.0*  HGB 12.3 10.5* 9.7*  HCT 38.1 32.7* 30.1*   Recent Labs    02/15/22 1030 02/16/22 0419  NA 142 139  K 4.4 4.4  CL 107 105  CO2 27 27  GLUCOSE 119* 96  BUN 29* 28*  CREATININE 0.89 1.23*  CALCIUM 11.0* 9.6   No results for input(s): "LABPT", "INR" in the last 72 hours. No results for input(s): "LABURIN" in the last 72 hours. Results for orders placed or performed during the hospital encounter of 01/23/22  Urine Culture     Status: None   Collection Time: 01/23/22 10:32 AM   Specimen: Urine, Clean Catch  Result Value Ref Range Status   Specimen Description   Final    URINE, CLEAN CATCH Performed at [facility], [place] [address] [place],  [address]    Special Requests   Final    NONE Performed at [facility], [place] [address] [place],  [place] [address]    Culture   Final    NO GROWTH Performed at [facility], [place] [address] [place], [place] [address]    Report Status 01/24/2022 FINAL  Final    Disposition: Home  Discharge instruction: The patient was instructed to be ambulatory but told to refrain from heavy lifting, strenuous activity, or driving.   Discharge medications:  Allergies as of 02/16/2022       Reactions   Dilaudid [hydromorphone Hcl] Nausea And Vomiting   Crab [shellfish Allergy] Swelling   Other Other (See Comments)   "pt is a difficult IV stick, prefers IV team paged for IV starts"         Medication List     STOP taking these medications    sulfamethoxazole-trimethoprim 800-160 MG tablet Commonly known as: BACTRIM DS       TAKE these medications    acetaminophen 500 MG tablet Commonly known as: TYLENOL Take 500-1,000 mg by mouth every 8 (eight) hours as needed for moderate pain or headache.   aspirin EC 81 MG tablet Take 81 mg by mouth every morning.   BARIATRIC MULTIVITAMINS/IRON PO Take 1 tablet by mouth daily.   bismuth subsalicylate 449 MG chewable tablet Commonly known as: PEPTO BISMOL Chew 524 mg by mouth 3 (three) times daily as needed (stomach pain.).   cephALEXin 500 MG capsule Commonly known as: KEFLEX Take 1 capsule (500 mg total) by mouth 4 (four) times daily for 7 days.   CITRACAL +D3 PO Take 1 tablet by mouth in the morning and at bedtime.   docusate sodium 100 MG capsule Commonly known as: COLACE Take 100 mg by mouth in the morning.   felodipine 5 MG 24 hr tablet Commonly known as: PLENDIL Take 5 mg by mouth in the morning.   furosemide 40 MG tablet Commonly known as: LASIX Take 40 mg by mouth daily.   gabapentin 300 MG capsule Commonly known as: NEURONTIN Take 300 mg by mouth 2 (two) times daily.   ibuprofen 800 MG tablet Commonly known as: ADVIL Take 1 tablet (800 mg total) by mouth every 8 (eight) hours as needed.   lamoTRIgine 100 MG tablet Commonly known as: LAMICTAL Take 100 mg by mouth every morning.   levETIRAcetam 500 MG tablet Commonly known as: KEPPRA Take 250 mg by mouth 2 (two) times daily.   LORazepam 0.5 MG tablet Commonly known as: ATIVAN Take 0.5 mg by mouth daily as needed for anxiety.   metFORMIN 1000 MG tablet Commonly known as: GLUCOPHAGE Take 1,000 mg by mouth 2 (two) times daily.   metoprolol succinate 25 MG 24 hr tablet Commonly known as: TOPROL-XL Take 1 tablet by mouth daily   olmesartan 40 MG tablet Commonly known as: BENICAR Take 40 mg by mouth every morning.   Ozempic (1  MG/DOSE) 2 MG/1.5ML Sopn Generic drug: Semaglutide (1 MG/DOSE) Inject 1 mg into the skin once a week.   pantoprazole 40 MG tablet Commonly known as: PROTONIX Take 1 tablet (40 mg total) by mouth 2 (two) times daily.   pioglitazone 30 MG tablet Commonly known as: ACTOS Take 30 mg by mouth every morning.   potassium chloride 10 MEQ tablet Commonly known as: KLOR-CON M TAKE 2 TABLETS BY MOUTH DAILY.   rosuvastatin 40 MG tablet Commonly known as: CRESTOR Take 1 tablet (40 mg total) by mouth at bedtime.   traMADol 50 MG tablet Commonly known as: Ultram Take 1-2 tablets (50-100 mg total) by mouth every 6 (  six) hours as needed for moderate pain.   Vitamin B-12 5000 MCG Subl Place 5,000 mcg under the tongue every 3 (three) days.   VITAMIN K PO Take 1 tablet by mouth 3 (three) times a week.        Followup:   Follow-up Information     Hollace Hayward, NP Follow up on 02/28/2022.   Why: Stent removal, 10:30 Contact information: [address] [place]. [place] [address] [phone]

## 2022-02-16 NOTE — Discharge Instructions (Addendum)
Discharge instructions following PCNL  Call your doctor for: Fevers greater than 100.5 Severe nausea or vomiting Increasing pain not controlled by pain medication Increasing redness or drainage from incisions Decreased urine output or a catheter is no longer draining  The number for questions is [phone].  Activity: Gradually increase activity with short frequent walks, 3-4 times a day.  Avoid strenuous activities, like sports, lawn-mowing, or heavy lifting (more than 10-15 pounds).  Wear loose, comfortable clothing that pull or kink the tube or tubes.  Do not drive while taking pain medication, or until your doctor permitts it.  Bathing and dressing changes: You should not shower for 48 hours after surgery.  Do not soak your back in a bathtub.  Diet: It is extremely important to drink plenty of fluids after surgery, especially water.  You may resume your regular diet, unless otherwise instructed.  Medications: May take Tylenol (acetaminophen) or ibuprofen (Advil, Motrin) as directed over-the-counter. Take any prescriptions as directed.  Follow-up appointments: Follow-up appointment will be scheduled in Dr. Carlton Adam office in 10-14 days for hospital check and stent removal.

## 2022-02-17 NOTE — Anesthesia Postprocedure Evaluation (Signed)
Anesthesia Post Note  Patient: Jill [name]  Procedure(s) Performed: LEFT NEPHROLITHOTOMY PERCUTANEOUS WITH SURGEON ACCESS, REMOVAL OF RIGHT URETERAL STENT (Left)     Patient location during evaluation: PACU Anesthesia Type: General Level of consciousness: awake and alert Pain management: pain level controlled Vital Signs Assessment: post-procedure vital signs reviewed and stable Respiratory status: spontaneous breathing, nonlabored ventilation, respiratory function stable and patient connected to nasal cannula oxygen Cardiovascular status: blood pressure returned to baseline and stable Postop Assessment: no apparent nausea or vomiting Anesthetic complications: no   No notable events documented.        Effie Berkshire

## 2022-02-21 ENCOUNTER — Encounter (HOSPITAL_COMMUNITY): Payer: Self-pay

## 2022-02-21 ENCOUNTER — Other Ambulatory Visit (HOSPITAL_COMMUNITY): Payer: Self-pay | Admitting: Interventional Radiology

## 2022-02-21 DIAGNOSIS — I771 Stricture of artery: Secondary | ICD-10-CM

## 2022-03-07 ENCOUNTER — Other Ambulatory Visit: Payer: Self-pay | Admitting: Radiology

## 2022-03-07 ENCOUNTER — Encounter (HOSPITAL_COMMUNITY): Payer: Self-pay | Admitting: Vascular Surgery

## 2022-03-07 ENCOUNTER — Other Ambulatory Visit: Payer: Self-pay

## 2022-03-07 ENCOUNTER — Encounter (HOSPITAL_COMMUNITY): Payer: Self-pay | Admitting: Interventional Radiology

## 2022-03-07 DIAGNOSIS — I6529 Occlusion and stenosis of unspecified carotid artery: Secondary | ICD-10-CM

## 2022-03-07 NOTE — Progress Notes (Signed)
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 Per 01/03/17 note by Dr. Estanislado Pandy, she had worsening left ICA supraclinoid stenosis on her recent CTA, so a formal catheter arteriogram would be needed and "Depending on the diagnostic arteriogram, further management whether conservative, and/or revisit endovascular revascularization would be discussed.Marland KitchenMarland KitchenThe diagnostic arteriogram with intent to treat will probably be scheduled sometime in early August of 2023, once the patient has complete treatment of her gastric adenomatous, and renal stones." She was maintained on ASA and Plavix.   History includes smoking, post-operative N/V, HTN, hypercholesterolemia, murmur (no significant valvular disease/trivial TR 11/2019 echo), GERD, anxiety, RLE DVT (RLE ~2009/2010?), DM2, CKD, nephrolithiasis (last procedure 02/15/22: removal right ureteral stent, left lithotripsy, left ureteral stent), seizures (04/2011), OSA (does not use CPAP), CVA (2009, 2014), PAD (s/p PTA/stenting right SFA & right CIA 08/17/08), anemia, neuropathy, obesity (s/p laparoscopic gastric sleeve resection 04/10/16), uterine fibroids with menorrhagia (uterine ablation 07/11/04; uterine artery ablation 12/12/12), carotid artery stenosis (balloon angioplasty of supraclinoid left ICA 10/14/14 with known occluded right ICA distal to ophthalmic artery)  Last visit with cardiologist Dr. Carlyle Dolly was on 10/04/21. Orthostatic dizziness had improved after changing Lasix to as needed. Known mild-moderate CAD in March 2016 following false positive stress test. She had a preoperative cardiology assessment per Coletta Memos, NP on 01/06/22  in reference to her urology surgeries performed in July 2023. She was felt acceptable risk at that time with no further CV testing recommended. She is on Plavix per IR Dr. Estanislado Pandy.  DIFFICULT IV stick--she requested IV team be consulted to start her IV as they had to be called in the past after multiple failed attempts.  She is a same day work-up. Anesthesia team to evaluate on the day of procedure.   VS: LMP 12/10/2014 Comment: pt states that there is no chance or pregnancy BP Readings from Last 3 Encounters:  02/16/22 115/64  02/02/22 (!) 145/117  01/23/22 (!) 149/80   Pulse Readings from Last 3 Encounters:  02/16/22 70  02/02/22 63  01/23/22 64     PROVIDERS: Iona Beard, MD is PCP  Carlyle Dolly, MD is cardiologist Kara Mead, MD is pulmonologist (OSA) Roney Marion, MD is GI. Limited colonoscopy 11/18/21 due to inadequate prep. Submucoasl mass in proximal ascendin gcolone Dahlstedt, Annie Main, MD/Herrick, Marland Kitchen, MD is urologist(s)   LABS: Labs on arrival as indicated. Last results in [facility] include: Lab Results  Component Value Date   WBC 13.0 (H) 02/16/2022   HGB 9.7 (L) 02/16/2022   HCT 30.1 (L) 02/16/2022   PLT 234 02/16/2022   GLUCOSE 96 02/16/2022   ALT 14 01/23/2022   AST 18 01/23/2022   NA 139 02/16/2022   K 4.4 02/16/2022   CL 105 02/16/2022   CREATININE 1.23 (H) 02/16/2022   BUN 28 (H) 02/16/2022   CO2 27 02/16/2022   HGBA1C 5.4 01/23/2022     OTHER: Home Sleep Study 01/11/22: Impressions: Very mild OSA with AHI 6/hr.  Less than 2 hours of recording time.  Consider repeating the study due to an adequate recording time.  Options for treatment of mild OSA include oral appliance or CPAP therapy.  EGD 12/27/21: IMPRESSION: -  Normal esophagus. - Erosive gastropathy with no stigmata of recent bleeding. - Gastritis. Biopsied. Pathology: Gastric antral mucosa with nonspecific reactive gastropathy. Gastric oxyntic mucosa with no specific  histopathologic changes. Helicobacter pylori-like organisms are not identified on routine HE stain.  - A sleeve gastrectomy was found, characterized by healthy appearing mucosa. - Nodular mucosa in the duodenal bulb (localized in two focal areas). Biopsied. 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 - Submucosal mass in the proximal ascending colon, possibly external compression. Biopsied. Pathology: Benign colonic mucosa with no diagnostic abnormality. Negative for submucosa. - The entire examined colon is normal. Biopsied. Pathology: Benign colonic mucosa with no diagnostic abnormality.  Recommendation: Repeat colonoscopy in 3 years for screening purposes given prep quality.   IMAGES: 1V PCXR 02/15/22: IMPRESSION: Linear densities in left lower lung fields suggest scarring or subsegmental atelectasis. There are no signs of pulmonary edema or focal pulmonary consolidation.  CTA Head/Neck 12/05/21: IMPRESSION: 1. No evidence of acute intracranial abnormality. Similar encephalomalacia in the right frontal lobe. 2. 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 3. Similar severe left paraclinoid ICA stenosis. 4. Similar 1 cm left parotid nodule. Given increase in size since more remote 2017 prior, recommend non-urgent ENT consultation for management.    EKG: 10/04/21: NSR   CV: Echo 12/09/19: IMPRESSIONS   1. Left ventricular ejection fraction, by estimation, is 60 to 65%. The  left ventricle has normal function. The left ventricle has no regional  wall motion abnormalities. There is moderate left ventricular hypertrophy.  Left ventricular diastolic  parameters are consistent with Grade I diastolic dysfunction (impaired  relaxation).   2. Right ventricular systolic function is normal. The right  ventricular  size is normal.   3. The mitral valve is normal in structure. No evidence of mitral valve  regurgitation. No evidence of mitral stenosis.   4. The aortic valve is tricuspid. Aortic valve regurgitation is not  visualized. No aortic stenosis is present.   5. The inferior vena cava is dilated in size with >50% respiratory  variability, suggesting right atrial pressure of 8 mmHg.    Cardiac cath 09/28/14 (for high risk 2 day nuclear stress test, large area of anteroseptal ischemia, EF 60% 09/16/14) IMPRESSIONS: Normal left main coronary artery. Mild disease in the left anterior descending artery and its branches. Mild disease in the dominant left circumflex artery and its branches. Moderate disease in the nondominant right coronary artery. Left ventricular systolic function not assessed.  LVEDP 18 mmHg.   6.   False positive nuclear stress test.   RECOMMENDATION:  Continue aggressive preventive therapy given her history of peripheral vascular disease and multiple risk factors for coronary atherosclerosis.   US Carotid 09/11/14: IMPRESSION: - No significant atherosclerotic disease of the left or right carotid systems, with no significant stenosis at the left or right carotid bifurcation by SRU duplex criteria. - High resistant waveform of the right ICA is suggestive of intracranial stenosis or possibly occlusion. A stenosis was previously demonstrated on the angiogram dated 05/27/2008, potentially having progressed. Repeat formal angiogram or CT angiogram may be useful. - High diastolic flow of the right vertebral artery is suggestive of flow compensation for the probable right intracranial ICA stenosis/occlusion.   Past Medical History:  Diagnosis Date   Anemia    Anxiety    Back pain    complains when laying on hard flat surface  Chronic kidney disease    appt. /w urology 11/10/2014- for a cyst seen on [place]. evaluated was told it was nothing.   Diabetes mellitus     Diabetes II, oral and insulin.   DVT (deep venous thrombosis) (HCC)    right leg '09 or '10   Fibroids    GERD (gastroesophageal reflux disease)    Heart murmur    History of kidney stones    History of stress test    referred for cardiac cath- done here at [facility]- 09/2014   Hypercholesterolemia    Hypertension    Neuromuscular disorder ([place])    neuropathy   PONV (postoperative nausea and vomiting)    Seizures ([place])    04/2010 x1- Dr. Colan Neptune x2 yearly.   Sleep apnea    no longer uses a cpap   Stroke [facility])    '09-had blockage in brain, too deep to operate"occ left lower extremity fatique, occ. 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    Past Surgical History:  Procedure Laterality Date   APPENDECTOMY     BIOPSY  11/18/2021   Procedure: BIOPSY;  Surgeon: Harvel Quale, MD;  [facility];  Service: Gastroenterology;;  random colon;   BIOPSY  12/27/2021   Procedure: BIOPSY;  Surgeon: Harvel Quale, MD;  [facility];  Service: Gastroenterology;;   [place]   x2   CHOLECYSTECTOMY     open"gallstones"   COLONOSCOPY WITH PROPOFOL N/A 11/18/2021   Procedure: COLONOSCOPY WITH PROPOFOL;  Surgeon: Harvel Quale, MD;  [facility];  Service: Gastroenterology;  Laterality: N/A;  [place] N/A 05/10/2017   Procedure: DILATATION & CURETTAGE/HYSTEROSCOPY WITH MYOSURE;  Surgeon: Servando Salina, MD;  [facility];  Service: Gynecology;  Laterality: N/A;   ESOPHAGOGASTRODUODENOSCOPY (EGD) WITH PROPOFOL N/A 12/27/2021   Procedure: ESOPHAGOGASTRODUODENOSCOPY (EGD) WITH PROPOFOL;  Surgeon: Harvel Quale, MD;  [facility];  Service: Gastroenterology;  Laterality: N/A;  100   ESOPHAGOGASTRODUODENOSCOPY (EGD) WITH PROPOFOL  N/A 01/13/2022   Procedure: ESOPHAGOGASTRODUODENOSCOPY (EGD) WITH PROPOFOL;  Surgeon: Harvel Quale, MD;  [facility];  Service: Gastroenterology;  Laterality: N/A;  235, per Darius Bump pt knows to arrive at [place]  01/13/2022   Procedure: HEMOSTASIS CLIP PLACEMENT;  Surgeon: Harvel Quale, MD;  [facility];  Service: Gastroenterology;;   INTERVENTIONAL RADIOLOGY PROCEDURE     CT angiography /neck -Dr. Estanislado Pandy.   IR ANGIO INTRA EXTRACRAN SEL COM CAROTID INNOMINATE BILAT MOD SED  02/22/2018   IR ANGIO VERTEBRAL SEL VERTEBRAL BILAT MOD SED  02/22/2018   IR GENERIC HISTORICAL  08/03/2016   IR RADIOLOGIST EVAL & MGMT 08/03/2016 [facility]   IR RADIOLOGIST EVAL & MGMT  01/03/2022   LAPAROSCOPIC GASTRIC SLEEVE RESECTION N/A 04/10/2016   Procedure: LAPAROSCOPIC GASTRIC SLEEVE RESECTION WITH UPPER ENDO;  Surgeon: Greer Pickerel, MD;  Location: [facility];  Service: General;  Laterality: N/A;   LEFT HEART CATHETERIZATION WITH CORONARY ANGIOGRAM N/A 09/28/2014   Procedure: LEFT HEART CATHETERIZATION WITH CORONARY ANGIOGRAM;  Surgeon: Jettie Booze, MD;  Location: [facility] CATH LAB;  Service: Cardiovascular;  Laterality: N/A;   NEPHROLITHOTOMY Right 02/01/2022   Procedure: RIGHT NEPHROLITHOTOMY PERCUTANEOUS WITH SURGEON ACCESS;  Surgeon: Ardis Hughs, MD;  Location:  [facility];  Service: Urology;  Laterality: Right;   NEPHROLITHOTOMY Left 02/15/2022   Procedure: LEFT NEPHROLITHOTOMY PERCUTANEOUS WITH SURGEON ACCESS, REMOVAL OF RIGHT URETERAL STENT;  Surgeon: Ardis Hughs, MD;  Location: [facility];  Service: Urology;  Laterality: Left;  195 MINUTES   POLYPECTOMY  01/13/2022   Procedure: POLYPECTOMY;  Surgeon: Harvel Quale, MD;  [facility];  Service: Gastroenterology;;   RADIOLOGY WITH ANESTHESIA N/A 10/14/2014   Procedure: ANGIOPLASTY;  Surgeon: Luanne Bras, MD;  Location: [place];  Service: Radiology;  Laterality:  N/A;   TUBAL LIGATION      MEDICATIONS: No current facility-administered medications for this encounter.    acetaminophen (TYLENOL) 500 MG tablet   aspirin EC 81 MG tablet   bismuth subsalicylate (PEPTO BISMOL) 262 MG chewable tablet   Calcium-Phosphorus-Vitamin D (CITRACAL +D3 PO)   clopidogrel (PLAVIX) 75 MG tablet   Cyanocobalamin (VITAMIN B-12) 5000 MCG SUBL   docusate sodium (COLACE) 100 MG capsule   felodipine (PLENDIL) 5 MG 24 hr tablet   furosemide (LASIX) 40 MG tablet   gabapentin (NEURONTIN) 300 MG capsule   lamoTRIgine (LAMICTAL) 100 MG tablet   levETIRAcetam (KEPPRA) 500 MG tablet   LORazepam (ATIVAN) 0.5 MG tablet   metFORMIN (GLUCOPHAGE) 1000 MG tablet   metoprolol succinate (TOPROL-XL) 25 MG 24 hr tablet   Multiple Vitamins-Minerals (BARIATRIC MULTIVITAMINS/IRON PO)   olmesartan (BENICAR) 40 MG tablet   OZEMPIC, 1 MG/DOSE, 2 MG/1.5ML SOPN   pantoprazole (PROTONIX) 40 MG tablet   pioglitazone (ACTOS) 30 MG tablet   potassium chloride (KLOR-CON M) 10 MEQ tablet   rosuvastatin (CRESTOR) 40 MG tablet   traMADol (ULTRAM) 50 MG tablet    Myra Gianotti, PA-C Surgical Short Stay/Anesthesiology [facility] Phone [phone] [facility] Phone [phone] 03/07/2022 12:59 PM

## 2022-03-07 NOTE — Anesthesia Preprocedure Evaluation (Signed)
Anesthesia Evaluation    Airway        Dental   Pulmonary Current Smoker,           Cardiovascular hypertension,      Neuro/Psych    GI/Hepatic   Endo/Other  diabetes  Renal/GU      Musculoskeletal   Abdominal   Peds  Hematology   Anesthesia Other Findings   Reproductive/Obstetrics                             Anesthesia Physical Anesthesia Plan  ASA:   Anesthesia Plan:    Post-op Pain Management:    Induction:   PONV Risk Score and Plan:   Airway Management Planned:   Additional Equipment:   Intra-op Plan:   Post-operative Plan:   Informed Consent:   Plan Discussed with:   Anesthesia Plan Comments: (PAT note written 03/07/2022 by Myra Gianotti, PA-C.  DIFFICULT IV stick--she requested IV team be consulted to start her IV as they had to be called in the past after multiple failed attempts.  )        Anesthesia Quick Evaluation

## 2022-03-07 NOTE — Progress Notes (Signed)
Spoke with pt for pre-op call. Pt has an extensive medical history. Pt does not have CAD. Pt is diabetic. Last A1C was 5.4 on 01/23/22. Pt states she does not check her blood sugar often. I did ask her to check her blood sugar in the AM and every 2 hours until she leaves for the hospital. If blood sugar is 70 or below, treat with 1/2 cup of clear juice (apple or cranberry) and recheck blood sugar 15 minutes after drinking juice. If blood sugar continues to be 70 or below, call the Short Stay department and ask to speak to a nurse. Pt voiced understanding. Pt instructed not to take Metformin or Actos in the AM.   Shower instructions given to pt and she voiced understanding.   Pt does request that we call IV therapy to start her IV. She states they have to be called every time and she prefers not to be stuck several times prior to them being called.

## 2022-03-08 ENCOUNTER — Other Ambulatory Visit: Payer: Self-pay | Admitting: Physician Assistant

## 2022-03-08 ENCOUNTER — Ambulatory Visit (HOSPITAL_COMMUNITY)
Admission: RE | Admit: 2022-03-08 | Discharge: 2022-03-09 | Disposition: A | Discharge status: Home / Self Care | Payer: BC Managed Care – PPO | Attending: Interventional Radiology | Admitting: Interventional Radiology

## 2022-03-08 ENCOUNTER — Ambulatory Visit (HOSPITAL_COMMUNITY)
Admission: RE | Admit: 2022-03-08 | Discharge: 2022-03-08 | Disposition: A | Discharge status: Home / Self Care | Payer: BC Managed Care – PPO | Source: Ambulatory Visit | Attending: Interventional Radiology | Admitting: Interventional Radiology

## 2022-03-08 ENCOUNTER — Encounter (HOSPITAL_COMMUNITY)
Admission: RE | Disposition: A | Discharge status: Home / Self Care | Payer: Self-pay | Source: Home / Self Care | Attending: Interventional Radiology

## 2022-03-08 ENCOUNTER — Encounter (HOSPITAL_COMMUNITY): Payer: Self-pay

## 2022-03-08 VITALS — BP 102/56 | HR 64 | Temp 98.4°F | Resp 14 | Ht 66.0 in | Wt 206.0 lb

## 2022-03-08 DIAGNOSIS — Z7982 Long term (current) use of aspirin: Secondary | ICD-10-CM | POA: Diagnosis not present

## 2022-03-08 DIAGNOSIS — Z7902 Long term (current) use of antithrombotics/antiplatelets: Secondary | ICD-10-CM | POA: Diagnosis not present

## 2022-03-08 DIAGNOSIS — I6529 Occlusion and stenosis of unspecified carotid artery: Secondary | ICD-10-CM

## 2022-03-08 DIAGNOSIS — I639 Cerebral infarction, unspecified: Secondary | ICD-10-CM

## 2022-03-08 DIAGNOSIS — I6523 Occlusion and stenosis of bilateral carotid arteries: Secondary | ICD-10-CM | POA: Diagnosis not present

## 2022-03-08 DIAGNOSIS — I1310 Hypertensive heart and chronic kidney disease without heart failure, with stage 1 through stage 4 chronic kidney disease, or unspecified chronic kidney disease: Secondary | ICD-10-CM | POA: Insufficient documentation

## 2022-03-08 DIAGNOSIS — Z8673 Personal history of transient ischemic attack (TIA), and cerebral infarction without residual deficits: Secondary | ICD-10-CM | POA: Diagnosis not present

## 2022-03-08 DIAGNOSIS — I771 Stricture of artery: Secondary | ICD-10-CM

## 2022-03-08 DIAGNOSIS — N189 Chronic kidney disease, unspecified: Secondary | ICD-10-CM | POA: Insufficient documentation

## 2022-03-08 DIAGNOSIS — E1122 Type 2 diabetes mellitus with diabetic chronic kidney disease: Secondary | ICD-10-CM | POA: Insufficient documentation

## 2022-03-08 HISTORY — PX: IR ANGIO INTRA EXTRACRAN SEL COM CAROTID INNOMINATE BILAT MOD SED: IMG5360

## 2022-03-08 HISTORY — DX: Myoneural disorder, unspecified: G70.9

## 2022-03-08 HISTORY — PX: IR ANGIO VERTEBRAL SEL VERTEBRAL UNI R MOD SED: IMG5368

## 2022-03-08 LAB — PROTIME-INR
INR: 1 (ref 0.8–1.2)
Prothrombin Time: 13.4 seconds (ref 11.4–15.2)

## 2022-03-08 LAB — CBC
HCT: 34.3 % — ABNORMAL LOW (ref 36.0–46.0)
Hemoglobin: 11.3 g/dL — ABNORMAL LOW (ref 12.0–15.0)
MCH: 31 pg (ref 26.0–34.0)
MCHC: 32.9 g/dL (ref 30.0–36.0)
MCV: 94 fL (ref 80.0–100.0)
Platelets: 200 10*3/uL (ref 150–400)
RBC: 3.65 MIL/uL — ABNORMAL LOW (ref 3.87–5.11)
RDW: 12.9 % (ref 11.5–15.5)
WBC: 12.6 10*3/uL — ABNORMAL HIGH (ref 4.0–10.5)
nRBC: 0 % (ref 0.0–0.2)

## 2022-03-08 LAB — BASIC METABOLIC PANEL
Anion gap: 7 (ref 5–15)
BUN: 16 mg/dL (ref 6–20)
CO2: 24 mmol/L (ref 22–32)
Calcium: 10.4 mg/dL — ABNORMAL HIGH (ref 8.9–10.3)
Chloride: 108 mmol/L (ref 98–111)
Creatinine, Ser: 0.9 mg/dL (ref 0.44–1.00)
GFR, Estimated: >60 mL/min (ref >60)
Glucose, Bld: 133 mg/dL — ABNORMAL HIGH (ref 70–99)
Potassium: 4 mmol/L (ref 3.5–5.1)
Sodium: 139 mmol/L (ref 135–145)

## 2022-03-08 SURGERY — IR WITH ANESTHESIA
Anesthesia: General

## 2022-03-08 MED ORDER — DOXYCYCLINE MONOHYDRATE 100 MG PO TABS
100.0000 mg | ORAL_TABLET | Freq: Two times a day (BID) | ORAL | 0 refills | Status: AC
Start: 2022-03-08 — End: 2022-03-15

## 2022-03-08 MED ORDER — MIDAZOLAM HCL 2 MG/2ML IJ SOLN
INTRAMUSCULAR | Status: AC
  Filled 2022-03-08: qty 4

## 2022-03-08 MED ORDER — SODIUM CHLORIDE 0.9 % IV BOLUS: 250.0000 mL | Freq: Once | INTRAVENOUS | Status: DC

## 2022-03-08 MED ORDER — IOHEXOL 300 MG/ML  SOLN: 100.0000 mL | Freq: Once | INTRAMUSCULAR | Status: DC | PRN

## 2022-03-08 MED ORDER — MIDAZOLAM HCL 2 MG/2ML IJ SOLN
INTRAMUSCULAR | Status: AC | PRN
  Administered 2022-03-08: .5 mg via INTRAVENOUS
  Administered 2022-03-08: 1 mg via INTRAVENOUS

## 2022-03-08 MED ORDER — HEPARIN SODIUM (PORCINE) 1000 UNIT/ML IJ SOLN
INTRAMUSCULAR | Status: AC | PRN
  Administered 2022-03-08: 1000 [IU] via INTRAVENOUS

## 2022-03-08 MED ORDER — SODIUM CHLORIDE 0.9 % IV SOLN: Freq: Once | INTRAVENOUS | Status: AC

## 2022-03-08 MED ORDER — SODIUM CHLORIDE 0.9 % IV SOLN: INTRAVENOUS | Status: AC

## 2022-03-08 MED ORDER — FENTANYL CITRATE (PF) 100 MCG/2ML IJ SOLN
INTRAMUSCULAR | Status: AC | PRN
  Administered 2022-03-08 (×2): 25 ug via INTRAVENOUS

## 2022-03-08 MED ORDER — HEPARIN SODIUM (PORCINE) 1000 UNIT/ML IJ SOLN
INTRAMUSCULAR | Status: AC
  Filled 2022-03-08: qty 10

## 2022-03-08 MED ORDER — FENTANYL CITRATE (PF) 100 MCG/2ML IJ SOLN
INTRAMUSCULAR | Status: AC
  Filled 2022-03-08: qty 2

## 2022-03-08 MED ORDER — LIDOCAINE HCL 1 % IJ SOLN
INTRAMUSCULAR | Status: AC
  Filled 2022-03-08: qty 20

## 2022-03-08 NOTE — Progress Notes (Signed)
Purewick placed-pt given education-states she is aware of device

## 2022-03-08 NOTE — Discharge Instructions (Signed)
RESTART METFORMIN FRIDAY 8/18 PER DR Estanislado Pandy

## 2022-03-08 NOTE — Procedures (Signed)
INR. Status post bilateral common carotid artery and right vertebral artery arteriograms. Right CFA approach. Findings. 1.  No change in the occluded right internal carotid artery at the level of the right ophthalmic artery 2.  Stable appearing left internal carotid artery distal cavernous segment narrowing of approximately 50 to 60% compared to the diagnostic cath arteriogram of August 2019. 3 . Focal saccular outpouching at the left anterior cerebral artery A1 A2 junction measuring approximately 3 mm suspicious of a flow related aneurysm.  Arlean Hopping MD

## 2022-03-08 NOTE — Progress Notes (Signed)
Pt d/c NAD via w/c

## 2022-03-08 NOTE — H&P (Signed)
  The note originally documented on this encounter has been moved the the encounter in which it belongs.    MG tablet Take 20 mg by mouth 2 (two) times daily.    [provider]  gabapentin (NEURONTIN) 300 MG capsule Take 300 mg by mouth 2 (two) times daily. 01/07/16   [provider]  lamoTRIgine (LAMICTAL) 100 MG tablet Take 100 mg by mouth every morning.     [provider]  levETIRAcetam (KEPPRA) 500 MG tablet Take 250 mg by mouth 2 (two) times daily.    [provider]  LORazepam (ATIVAN) 0.5 MG tablet Take 0.5 mg by mouth daily as needed for anxiety.    [provider]  metFORMIN (GLUCOPHAGE) 1000 MG tablet Take 1,000 mg by mouth 2 (two) times daily. 02/26/20   [provider]  metoprolol succinate (TOPROL-XL) 25 MG 24 hr tablet Take 1 tablet by mouth daily 02/18/20   Imogene Burn, PA-C  Multiple Vitamins-Minerals (BARIATRIC MULTIVITAMINS/IRON PO) Take 1 tablet by mouth every evening.    [provider]   olmesartan (BENICAR) 40 MG tablet Take 40 mg by mouth every morning.     [provider]  OZEMPIC, 1 MG/DOSE, 2 MG/1.5ML SOPN Inject 1 mg into the skin every 30 (thirty) days. 12/18/19   [provider]  pantoprazole (PROTONIX) 40 MG tablet Take 1 tablet (40 mg total) by mouth 2 (two) times daily. 01/16/22   Harvel Quale, MD  pioglitazone (ACTOS) 30 MG tablet Take 30 mg by mouth every morning.    [provider]  potassium chloride (KLOR-CON M) 10 MEQ tablet TAKE 2 TABLETS BY MOUTH DAILY. 08/30/21   Satira Sark, MD  rosuvastatin (CRESTOR) 40 MG tablet Take 1 tablet (40 mg total) by mouth at bedtime. 12/15/19   Imogene Burn, PA-C  traMADol (ULTRAM) 50 MG tablet Take 1-2 tablets (50-100 mg total) by mouth every 6 (six) hours as needed for moderate pain. Patient not taking: Reported on 03/03/2022 02/02/22   Ardis Hughs, MD     Family History  Problem Relation Age of Onset   Heart failure Father    Heart disease Father    Heart attack Father        37's   Kidney disease Mother        dialysis for 62 years   Heart attack Mother        15's   Stroke Paternal Grandmother     Social History   Socioeconomic History   Marital status: Married    Spouse name: Not on file   Number of children: Not on file   Years of education: Not on file   Highest education level: Not on file  Occupational History   Not on file  Tobacco Use   Smoking status: Every Day    Packs/day: 0.25    Years: 41.00    Total pack years: 10.25    Types: Cigarettes    Start date: 07/25/1975    Passive exposure: Current   Smokeless tobacco: Never  Vaping Use   Vaping Use: Never used  Substance and Sexual Activity   Alcohol use: Never   Drug use: No   Sexual activity: Yes    Birth control/protection: Other-see comments, Surgical    Comment: Tubal Ligation  Other Topics Concern   Not on file  Social History Narrative   Not on file   Social Determinants of  Health   Financial Resource Strain: Not on file  Food Insecurity: Not on file  Transportation Needs: Not on file  Physical Activity: Not on file  Stress: Not on file  Social Connections: Not on file     Review of Systems: A 12 point ROS discussed and pertinent positives are indicated in the HPI above.  All other systems are negative.  Review of Systems  Vital Signs: BP 102/60   Pulse 62   Temp 98.4 F (36.9 C) (Oral)   Resp 18   Ht '5\' 6"'$  (1.676 m)   Wt 206 lb (93.4 kg)   LMP 12/10/2014 Comment: pt states that there is no chance or pregnancy  SpO2 99%   BMI 33.25 kg/m   Physical Exam Vitals reviewed.  Constitutional:      Appearance: Normal appearance.  HENT:     Head: Normocephalic and atraumatic.  Eyes:     Extraocular Movements: Extraocular movements intact.  Cardiovascular:     Rate and Rhythm: Normal rate and regular rhythm.  Pulmonary:     Effort: Pulmonary effort is normal. No respiratory distress.     Breath sounds: Normal breath sounds.  Abdominal:     Palpations: Abdomen is soft.  Genitourinary:    Comments: Left buttock with draining abscess. Appears fairly small. Only a tiny amount of thin hazy white drainage seen. No visible erythema. Musculoskeletal:        General: Normal range of motion.     Cervical back: Normal range of motion.  Skin:    General: Skin is warm and dry.  Neurological:     General: No focal deficit present.     Mental Status: She is alert and oriented to person, place, and time.  Psychiatric:        Mood and Affect: Mood normal.        Behavior: Behavior normal.        Thought Content: Thought content normal.        Judgment: Judgment normal.     Imaging:  CLINICAL DATA:  History of stroke, follow up.   EXAM: CT ANGIOGRAPHY HEAD AND NECK   TECHNIQUE: Multidetector CT imaging of the head and neck was performed using the standard protocol during bolus administration of intravenous contrast. Multiplanar CT image  reconstructions and MIPs were obtained to evaluate the vascular anatomy. Carotid stenosis measurements (when applicable) are obtained utilizing NASCET criteria, using the distal internal carotid diameter as the denominator.   RADIATION DOSE REDUCTION: This exam was performed according to the departmental dose-optimization program which includes automated exposure control, adjustment of the mA and/or kV according to patient size and/or use of iterative reconstruction technique.   CONTRAST:  15m OMNIPAQUE IOHEXOL 350 MG/ML SOLN   COMPARISON:  CTA head/neck October 28, 2020.   FINDINGS: CT HEAD FINDINGS   Brain: No evidence of acute large vascular territory infarction, hemorrhage, hydrocephalus, extra-axial collection or mass lesion/mass effect. Similar areas of encephalomalacia in the right frontal lobe.   Vascular: Detailed below.   Skull: No acute fracture.   Sinuses: Clear visualized sinuses.   Orbits: No acute finding.   Review of the MIP images confirms the above findings   CTA NECK FINDINGS   Aortic arch: Great vessel origins are patent without significant stenosis.   Right carotid system: Atherosclerosis at the carotid bifurcation without greater than 50% stenosis. Similarly small ICA in the neck, likely related to intracranial ICA occlusion described below.   Left carotid system: Atherosclerosis at the carotid bifurcation without greater than 50% stenosis.   Vertebral arteries: Right dominant. No evidence of significant (greater than 50%) stenosis in the neck.   Skeleton: No acute  findings.   Other neck: Similar 1 cm left parotid nodule.   Upper chest: Visualized lung apices are clear.   Review of the MIP images confirms the above findings   CTA HEAD FINDINGS   Anterior circulation: Similar calcific atherosclerosis of the intracranial ICAs. Similar diminutive caliber of the intracranial ICAs with occlusion of the right ICA and left a  meningeal collaterals in the anterior right sylvian fissure with diminutive opacification right M1 MCA. Similar severe left paraclinoid ICA stenosis. Left MCA is patent. Similar non opacification the right A1 ACA with left A1 and bilateral A2 ACAs remain patent.   Posterior circulation: Right dominant intradural vertebral artery. Both intradural vertebral arteries, basilar artery and posterior cerebral arteries are patent without proximal hemodynamically significant stenosis.   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   Review of the MIP images confirms the above findings   IMPRESSION: 1. No evidence of acute intracranial abnormality. Similar encephalomalacia in the right frontal lobe. 2. 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 3. Similar severe left paraclinoid ICA stenosis. 4. Similar 1 cm left parotid nodule. Given increase in size since more remote 2017 prior, recommend non-urgent ENT consultation for management.     Electronically Signed   By: Margaretha Sheffield M.D.   On: 12/05/2021 10:06   DG Chest Port 1 View  Result Date: 02/15/2022 CLINICAL DATA:  Renal stones EXAM: PORTABLE CHEST 1 VIEW COMPARISON:  Previous studies including the examination of 11/26/2015 FINDINGS: Cardiac size is within normal limits. There are no signs of pulmonary edema or focal pulmonary consolidation. There is no pleural effusion or pneumothorax. Linear densities in the left lower lung fields suggest scarring or subsegmental atelectasis. IMPRESSION: Linear densities in left lower lung fields suggest scarring or subsegmental atelectasis. There are no signs of pulmonary edema or focal pulmonary consolidation. Electronically Signed   By: Elmer Picker M.D.   On: 02/15/2022 17:32   DG C-Arm 1-60 Min-No Report  Result Date: 02/15/2022 Fluoroscopy was utilized by the requesting  physician.  No radiographic interpretation.   DG C-Arm 1-60 Min-No Report  Result Date: 02/15/2022 Fluoroscopy was utilized by the requesting physician.  No radiographic interpretation.   DG C-Arm 1-60 Min-No Report  Result Date: 02/15/2022 Fluoroscopy was utilized by the requesting physician.  No radiographic interpretation.   DG C-Arm 1-60 Min-No Report  Result Date: 02/15/2022 Fluoroscopy was utilized by the requesting physician.  No radiographic interpretation.    Labs:  CBC: Recent Labs    02/02/22 0518 02/02/22 1022 02/15/22 1030 02/15/22 2022 02/16/22 0419  WBC 11.9* 13.6* 9.8  --  13.0*  HGB 9.4* 10.8* 12.3 10.5* 9.7*  HCT 28.8* 33.0* 38.1 32.7* 30.1*  PLT 154 177 315  --  234    COAGS: No results for input(s): "INR", "APTT" in the last 8760 hours.  BMP: Recent Labs    02/01/22 1432 02/02/22 0518 02/15/22 1030 02/16/22 0419  NA 139 140 142 139  K 4.3 3.4* 4.4 4.4  CL 112* 110 107 105  CO2 18* '23 27 27  '$ GLUCOSE 174* 100* 119* 96  BUN 20 20 29* 28*  CALCIUM 10.0 8.9 11.0* 9.6  CREATININE 1.01* 0.82 0.89 1.23*  GFRNONAA >60 >60 >60 51*    LIVER FUNCTION TESTS: Recent Labs    10/14/21 1010 01/23/22 1032  BILITOT 0.6 0.8  AST 13 18  ALT 9 14  ALKPHOS  --  72  PROT 7.2  7.6  ALBUMIN  --  3.9    TUMOR MARKERS: No results for input(s): "AFPTM", "CEA", "CA199", "CHROMGRNA" in the last 8760 hours.  Assessment and Plan:  Severe left paraclinoid ICA stenosis.  Will proceed with diagnostic cerebral angiography today by Dr. Estanislado Pandy.  Risks and benefits of cerebral angiogram with intervention were discussed with the patient including, but not limited to bleeding, infection, vascular injury, contrast induced renal failure, stroke or even death.  This interventional procedure involves the use of X-rays and because of the nature of the planned procedure, it is possible that we will have prolonged use of X-ray fluoroscopy.  Potential radiation risks  to you include (but are not limited to) the following: - A slightly elevated risk for cancer  several years later in life. This risk is typically less than 0.5% percent. This risk is low in comparison to the normal incidence of human cancer, which is 33% for women and 50% for men according to the [employer]. - Radiation induced injury can include skin redness, resembling a rash, tissue breakdown / ulcers and hair loss (which can be temporary or permanent).   The likelihood of either of these occurring depends on the difficulty of the procedure and whether you are sensitive to radiation due to previous procedures, disease, or genetic conditions.   IF your procedure requires a prolonged use of radiation, you will be notified and given written instructions for further action.  It is your responsibility to monitor the irradiated area for the 2 weeks following the procedure and to notify your physician if you are concerned that you have suffered a radiation induced injury.    All of the patient's questions were answered, patient is agreeable to proceed.  Consent signed and in chart.   I will call in Doxycycline for the boil at the patient's request.   Thank you for allowing our service to participate in Jill [name] 's care.  Electronically Signed: Murrell Redden, PA-C   03/08/2022, 10:09 AM      I spent a total of    25 Minutes in face to face in clinical consultation, greater than 50% of which was counseling/coordinating care for Dx cerebral angiography.

## 2022-03-08 NOTE — H&P (Deleted)
  The note originally documented on this encounter has been moved the the encounter in which it belongs.  

## 2022-03-09 ENCOUNTER — Other Ambulatory Visit (HOSPITAL_COMMUNITY): Payer: Self-pay | Admitting: Interventional Radiology

## 2022-03-09 LAB — PLATELET INHIBITION P2Y12

## 2022-03-20 DIAGNOSIS — E6609 Other obesity due to excess calories: Secondary | ICD-10-CM | POA: Diagnosis not present

## 2022-03-20 DIAGNOSIS — E1169 Type 2 diabetes mellitus with other specified complication: Secondary | ICD-10-CM | POA: Diagnosis not present

## 2022-03-20 DIAGNOSIS — I2510 Atherosclerotic heart disease of native coronary artery without angina pectoris: Secondary | ICD-10-CM | POA: Diagnosis not present

## 2022-03-20 DIAGNOSIS — G4733 Obstructive sleep apnea (adult) (pediatric): Secondary | ICD-10-CM | POA: Diagnosis not present

## 2022-03-20 DIAGNOSIS — E785 Hyperlipidemia, unspecified: Secondary | ICD-10-CM | POA: Diagnosis not present

## 2022-03-20 DIAGNOSIS — I10 Essential (primary) hypertension: Secondary | ICD-10-CM | POA: Diagnosis not present

## 2022-03-28 ENCOUNTER — Emergency Department (HOSPITAL_COMMUNITY)
Admission: EM | Admit: 2022-03-28 | Discharge: 2022-03-28 | Disposition: A | Discharge status: Home / Self Care | Payer: BC Managed Care – PPO | Attending: Emergency Medicine | Admitting: Emergency Medicine

## 2022-03-28 ENCOUNTER — Other Ambulatory Visit: Payer: Self-pay

## 2022-03-28 ENCOUNTER — Emergency Department (HOSPITAL_COMMUNITY): Payer: BC Managed Care – PPO

## 2022-03-28 ENCOUNTER — Encounter (HOSPITAL_COMMUNITY): Payer: Self-pay

## 2022-03-28 DIAGNOSIS — Z7984 Long term (current) use of oral hypoglycemic drugs: Secondary | ICD-10-CM | POA: Insufficient documentation

## 2022-03-28 DIAGNOSIS — N189 Chronic kidney disease, unspecified: Secondary | ICD-10-CM | POA: Insufficient documentation

## 2022-03-28 DIAGNOSIS — I129 Hypertensive chronic kidney disease with stage 1 through stage 4 chronic kidney disease, or unspecified chronic kidney disease: Secondary | ICD-10-CM | POA: Diagnosis not present

## 2022-03-28 DIAGNOSIS — M25511 Pain in right shoulder: Secondary | ICD-10-CM | POA: Diagnosis not present

## 2022-03-28 DIAGNOSIS — Z79899 Other long term (current) drug therapy: Secondary | ICD-10-CM | POA: Insufficient documentation

## 2022-03-28 DIAGNOSIS — E1122 Type 2 diabetes mellitus with diabetic chronic kidney disease: Secondary | ICD-10-CM | POA: Insufficient documentation

## 2022-03-28 DIAGNOSIS — W19XXXA Unspecified fall, initial encounter: Secondary | ICD-10-CM | POA: Insufficient documentation

## 2022-03-28 DIAGNOSIS — G8929 Other chronic pain: Secondary | ICD-10-CM | POA: Diagnosis not present

## 2022-03-28 DIAGNOSIS — Z7982 Long term (current) use of aspirin: Secondary | ICD-10-CM | POA: Diagnosis not present

## 2022-03-28 MED ORDER — ACETAMINOPHEN 500 MG PO TABS
1000.0000 mg | ORAL_TABLET | Freq: Once | ORAL | Status: AC
  Administered 2022-03-28: 1000 mg via ORAL
  Filled 2022-03-28: qty 2

## 2022-03-28 MED ORDER — LIDOCAINE 5 % EX PTCH
1.0000 | MEDICATED_PATCH | Freq: Once | CUTANEOUS | Status: DC
  Administered 2022-03-28: 1 via TRANSDERMAL
  Filled 2022-03-28: qty 1

## 2022-03-28 MED ORDER — METHOCARBAMOL 500 MG PO TABS: 500.0000 mg | ORAL_TABLET | Freq: Two times a day (BID) | ORAL | 0 refills | Status: DC

## 2022-03-28 NOTE — Discharge Instructions (Addendum)
Your x-ray shows some arthritis but no signs of fracture or dislocation.  To treat pain use ice and heat, Tylenol 1000 mg every 6 hours, prescribed muscle relaxer as needed for muscle spasm.  You can also use over-the-counter Salonpas lidocaine patches.  If pain is not improving over the next week please follow-up with orthopedics.

## 2022-03-28 NOTE — ED Provider Notes (Signed)
[facility] EMERGENCY DEPARTMENT Provider Note   CSN: [id number] Arrival date & time: 03/28/22  3825     History  Chief Complaint  Patient presents with   Jill [name] Jill [name] is a 56 y.o. female.  Jill [name] is a 56 y.o. female with a history of hypertension, hyperlipidemia, melanoma, DVT, CKD, diabetes, who presents to the emergency department for evaluation of right shoulder pain after a fall.  Patient reports that she is trying to get something from her granddaughter yesterday and told and then fell forwards.  She thinks that she caught herself on her outstretched arm.  This prevented her from falling all the way to the ground or hitting her head but since the fall she has had worsening right shoulder pain.  She reports it got worse overnight.  She has taken 1 dose of Tylenol with minimal improvement.  Has not tried anything else to treat pain.  Reports pain worse with movement, in particular when trying to lift her right arm overhead.  No associated neck or back pain.  No other injuries from fall.  The history is provided by the patient and the spouse.  Fall       Home Medications Prior to Admission medications   Medication Sig Start Date End Date Taking? Authorizing Provider  acetaminophen (TYLENOL) 500 MG tablet Take 500-1,000 mg by mouth every 8 (eight) hours as needed for moderate pain or headache.     [provider]  aspirin EC 81 MG tablet Take 81 mg by mouth every morning.    [provider]  bismuth subsalicylate (PEPTO BISMOL) 262 MG chewable tablet Chew 524 mg by mouth 3 (three) times daily as needed (stomach pain.).    [provider]  Calcium-Phosphorus-Vitamin D (CITRACAL +D3 PO) Take 2 tablets by mouth in the morning and at bedtime.    [provider]  clopidogrel (PLAVIX) 75 MG tablet Take 75 mg by mouth in the morning.    [provider]  Cyanocobalamin (VITAMIN B-12) 5000 MCG SUBL Place 5,000 mcg under the  tongue 3 (three) times a week.    [provider]  docusate sodium (COLACE) 100 MG capsule Take 100 mg by mouth in the morning.    [provider]  felodipine (PLENDIL) 5 MG 24 hr tablet Take 5 mg by mouth in the morning.    [provider]  furosemide (LASIX) 40 MG tablet Take 20 mg by mouth 2 (two) times daily.    [provider]  gabapentin (NEURONTIN) 300 MG capsule Take 300 mg by mouth 2 (two) times daily. 01/07/16   [provider]  lamoTRIgine (LAMICTAL) 100 MG tablet Take 100 mg by mouth every morning.     [provider]  levETIRAcetam (KEPPRA) 500 MG tablet Take 250 mg by mouth 2 (two) times daily.    [provider]  LORazepam (ATIVAN) 0.5 MG tablet Take 0.5 mg by mouth daily as needed for anxiety.    [provider]  metFORMIN (GLUCOPHAGE) 1000 MG tablet Take 1,000 mg by mouth 2 (two) times daily. 02/26/20   [provider]  metoprolol succinate (TOPROL-XL) 25 MG 24 hr tablet Take 1 tablet by mouth daily 02/18/20   Imogene Burn, PA-C  Multiple Vitamins-Minerals (BARIATRIC MULTIVITAMINS/IRON PO) Take 1 tablet by mouth every evening.    [provider]  olmesartan (BENICAR) 40 MG tablet Take 40 mg by mouth every morning.     [provider]  OZEMPIC, 1 MG/DOSE, 2 MG/1.5ML SOPN Inject 1 mg into the skin every 30 (thirty) days. 12/18/19   [provider]  pantoprazole (PROTONIX) 40 MG tablet Take 1 tablet (40 mg total) by mouth 2 (two) times daily. 01/16/22   Jill Quale, MD  pioglitazone (ACTOS) 30 MG tablet Take 30 mg by mouth every morning.    [provider]  potassium chloride (KLOR-CON M) 10 MEQ tablet TAKE 2 TABLETS BY MOUTH DAILY. 08/30/21   Jill Sark, MD  rosuvastatin (CRESTOR) 40 MG tablet Take 1 tablet (40 mg total) by mouth at bedtime. 12/15/19   Imogene Burn, PA-C  traMADol (ULTRAM) 50 MG tablet Take 1-2 tablets (50-100 mg total) by mouth  every 6 (six) hours as needed for moderate pain. Patient not taking: Reported on 03/03/2022 02/02/22   Jill Hughs, MD      Allergies    Dilaudid [hydromorphone hcl], Otho Darner allergy], and Other    Review of Systems   Review of Systems  Constitutional:  Negative for chills and fever.  Musculoskeletal:  Positive for arthralgias. Negative for back pain, joint swelling and neck pain.  Neurological:  Negative for weakness and numbness.    Physical Exam Updated Vital Signs Ht '5\' 6"'$  (1.676 m)   Wt 93.9 kg   LMP 12/10/2014 Comment: pt states that there is no chance or pregnancy  BMI 33.41 kg/m  Physical Exam Vitals and nursing note reviewed.  Constitutional:      General: She is not in acute distress.    Appearance: Normal appearance. She is well-developed. She is not ill-appearing or diaphoretic.  HENT:     Head: Normocephalic and atraumatic.  Eyes:     General:        Right eye: No discharge.        Left eye: No discharge.  Neck:     Comments: No midline C-spine tenderness. Pulmonary:     Effort: Pulmonary effort is normal. No respiratory distress.  Musculoskeletal:     Comments: Tenderness over the anterior right shoulder without palpable deformity, no overlying skin changes or significant swelling.  Patient with decreased range of motion in particular when lifting arm overhead due to pain, passive range of motion intact, distal pulses 2+, normal strength and sensation, no pain or tenderness of the wrist or elbow.  Neurological:     Mental Status: She is alert and oriented to person, place, and time.     Coordination: Coordination normal.  Psychiatric:        Mood and Affect: Mood normal.        Behavior: Behavior normal.     ED Results / Procedures / Treatments   Labs (all labs ordered are listed, but only abnormal results are displayed) Labs Reviewed - No data to display  EKG None  Radiology DG Shoulder Right  Result Date: 03/28/2022 CLINICAL DATA:   [place].  Right shoulder pain. EXAM: RIGHT SHOULDER - 2+ VIEW COMPARISON:  None Available. FINDINGS: Moderate glenohumeral and AC joint degenerative changes. No acute fracture is identified. The visualized right ribs are intact and the visualized right lung is grossly clear. IMPRESSION: Moderate degenerative changes but no acute bony findings. Electronically Signed   By: Marijo Sanes M.D.   On: 03/28/2022 11:00    Procedures Procedures    Medications Ordered in ED Medications - No data to display  ED Course/ Medical Decision Making/ A&P  Medical Decision Making Amount and/or Complexity of Data Reviewed Radiology: ordered.   56 year old female presents with right shoulder pain after a mechanical fall yesterday where she caught herself on her outstretched arms.  No head injury or LOC and no other injuries from fall.  No obvious deformity to the right shoulder, right upper extremity is neurovascularly intact.  X-rays of the right shoulder obtained, viewed and interpreted by myself, some degenerative changes noted but no fracture or dislocation.  Suspect soft tissue injury from fall, may have some flareup of arthritis from fall as well.  Recommend supportive care with Tylenol, muscle relaxer as needed for spasm, Lidoderm patch, ice and heat, orthopedic follow-up if not improving after 1 week.  At this time there does not appear to be any evidence of an acute emergency medical condition requiring further emergent evaluation and the patient appears stable for discharge with appropriate outpatient follow up. Diagnosis and return precautions discussed with patient who verbalizes understanding and is agreeable to discharge.          Final Clinical Impression(s) / ED Diagnoses Final diagnoses:  Fall, initial encounter  Acute pain of right shoulder    Rx / DC Orders ED Discharge Orders          Ordered    methocarbamol (ROBAXIN) 500 MG tablet  2 times daily         03/28/22 1348              Jacqlyn Larsen, [place] 03/28/22 1358    Isla Pence, MD 03/28/22 1531

## 2022-03-28 NOTE — ED Triage Notes (Signed)
Pt presents to ED after fall. Pt c/o pain to right shoulder. Pt denies LOC or hitting head.

## 2022-04-05 ENCOUNTER — Ambulatory Visit: Payer: BC Managed Care – PPO | Admitting: Orthopedic Surgery

## 2022-05-11 ENCOUNTER — Encounter (INDEPENDENT_AMBULATORY_CARE_PROVIDER_SITE_OTHER): Payer: Self-pay | Admitting: Gastroenterology

## 2022-05-11 ENCOUNTER — Ambulatory Visit (INDEPENDENT_AMBULATORY_CARE_PROVIDER_SITE_OTHER): Payer: BC Managed Care – PPO | Admitting: Gastroenterology

## 2022-05-11 VITALS — BP 135/88 | HR 89 | Temp 98.4°F | Ht 66.5 in | Wt 211.5 lb

## 2022-05-11 DIAGNOSIS — K5900 Constipation, unspecified: Secondary | ICD-10-CM

## 2022-05-11 NOTE — Patient Instructions (Signed)
Increase water intake Take linzess consistently every day Avoid straining and limit toilet time to <5 minutes Please let me know if constipation is not improved with consistent dosing of linzess every day If you have any further rectal pain, bleeding or discharge, please make me aware immediately   Follow up 6 months

## 2022-05-11 NOTE — Progress Notes (Addendum)
Referring Provider: Iona Beard, MD Primary Care Physician:  Iona Beard, MD Primary GI Physician: Jenetta Downer   Chief Complaint  Patient presents with   Follow-up    Patient here today for a follow up. 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 Denies any dark or bright red stools. Was just started on Linzess 145 once per day.   HPI:   Jill [name] is a 56 y.o. female with past medical history of CKD, GERD, hyperlipidemia, hypertension, coronary artery disease status post cath showing mild to moderate nonobstructive disease, chronic diastolic heart failure, diabetes, DVT, sleep apnea, stroke, seizures, obesity status post sleeve gastrectomy, duodenal adenom  Patient presenting today for follow up   Last seen June 2023, at that time having some upper abdominal pain radiating to her groin, though pain less severe than prior. Taking pepto bismol and on pantoprazole '40mg'$  daily. Plans for EGD the next day as outlined below. Advised to increase PPI to BID thereafter.   Present:  Patient reports that abdominal pain subsided after she had surgical removal of kidney stones. She has not had any abdominal discomfort since then. She is on linzess 128mg and having a BM every 2-3 days, sometimes stools are still harder. She sometimes only takes the linzess every 2-3 days as she often forgets. Denies rectal bleeding or melena. Denies changes in appetite or weight loss. She is trying to drink a decent amount of water.   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 She states that she has a lesion in her rectal area that she can feel. If she is having more harder stools and straining, it does bothers her, causing more pain and sometimes bleeding. She sometimes uses hemorrhoid cream on the area which helps a lot. She currently is not having any issues with the lesion, denies any pain.   Last Colonoscopy:  11/18/21 - Preparation of the colon was  inadequate. - Submucosal mass in the proximal ascending colon, possibly external compression. Biopsied-benign - The entire examined colon is normal. Biopsied. Last Endoscopy:June 2023 - Normal esophagus. - A sleeve gastrectomy was found, characterized by healthy appearing mucosa. - Three duodenal polyps. Resected and retrieved. Injected. 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 /w urology 11/10/2014- for a cyst [place] was told it was nothing.   Diabetes mellitus    Diabetes II, oral and insulin.   DVT (deep venous thrombosis) (HCC)    right leg '09 or '10   Fibroids    GERD (gastroesophageal reflux disease)    Heart murmur    History of kidney stones    History of stress test    [facility]   Hypercholesterolemia    Hypertension    Neuromuscular disorder ([facility]   neuropathy   PONV (postoperative nausea and vomiting)    Seizures ([facility]   04/2010 x1- Dr. DeColan Neptune2 yearly.   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 strangle, occ  left mouth droop"mild"." may have a tendency to smile or cry with no reason" has had 5 strokes last stroke 2014    Past Surgical History:  Procedure Laterality Date   APPENDECTOMY     BIOPSY  11/18/2021   Procedure: BIOPSY;  Surgeon: Harvel Quale, MD;  [facility];  Service: Gastroenterology;;  random colon;   BIOPSY  12/27/2021   Procedure: BIOPSY;  Surgeon: Harvel Quale, MD;  [facility];  Service: Gastroenterology;;   [place]   x2   CHOLECYSTECTOMY      open"gallstones"   COLONOSCOPY WITH PROPOFOL N/A 11/18/2021   Procedure: COLONOSCOPY WITH PROPOFOL;  Surgeon: Harvel Quale, MD;  [facility];  Service: Gastroenterology;  Laterality: N/A;  [place] N/A 05/10/2017   Procedure: DILATATION & CURETTAGE/HYSTEROSCOPY WITH MYOSURE;  Surgeon: Servando Salina, MD;  Location: [facility];  Service: Gynecology;  Laterality: N/A;   ESOPHAGOGASTRODUODENOSCOPY (EGD) WITH PROPOFOL N/A 12/27/2021   Procedure: ESOPHAGOGASTRODUODENOSCOPY (EGD) WITH PROPOFOL;  Surgeon: Harvel Quale, MD;  [facility];  Service: Gastroenterology;  Laterality: N/A;  100   ESOPHAGOGASTRODUODENOSCOPY (EGD) WITH PROPOFOL N/A 01/13/2022   Procedure: ESOPHAGOGASTRODUODENOSCOPY (EGD) WITH PROPOFOL;  Surgeon: Harvel Quale, MD;  [facility];  Service: Gastroenterology;  Laterality: N/A;  235, per Darius Bump pt knows to arrive at [place]  01/13/2022   Procedure: HEMOSTASIS CLIP PLACEMENT;  Surgeon: Harvel Quale, MD;  [facility];  Service: Gastroenterology;;   INTERVENTIONAL RADIOLOGY PROCEDURE     CT angiography /neck -Dr. Estanislado Pandy.   IR ANGIO INTRA EXTRACRAN SEL COM CAROTID INNOMINATE BILAT MOD SED  02/22/2018   IR ANGIO INTRA EXTRACRAN SEL COM CAROTID INNOMINATE BILAT MOD SED  03/08/2022   IR ANGIO VERTEBRAL SEL VERTEBRAL BILAT MOD SED  02/22/2018   IR ANGIO VERTEBRAL SEL VERTEBRAL UNI R MOD SED  03/08/2022   IR GENERIC HISTORICAL  08/03/2016   IR RADIOLOGIST EVAL & MGMT 08/03/2016 [facility]   IR RADIOLOGIST EVAL & MGMT  01/03/2022   LAPAROSCOPIC GASTRIC SLEEVE RESECTION N/A 04/10/2016   Procedure: LAPAROSCOPIC GASTRIC SLEEVE RESECTION WITH UPPER ENDO;  Surgeon: Greer Pickerel, MD;  Location: [facility];  Service: General;  Laterality: N/A;   LEFT HEART CATHETERIZATION WITH CORONARY ANGIOGRAM N/A 09/28/2014   Procedure: LEFT HEART  CATHETERIZATION WITH CORONARY ANGIOGRAM;  Surgeon: Jettie Booze, MD;  Location: [facility] CATH [facility];  Service: Cardiovascular;  Laterality: N/A;   NEPHROLITHOTOMY Right 02/01/2022   Procedure: RIGHT NEPHROLITHOTOMY PERCUTANEOUS WITH SURGEON ACCESS;  Surgeon: Ardis Hughs, MD;  Location: [facility];  Service: Urology;  Laterality: Right;   NEPHROLITHOTOMY Left 02/15/2022   Procedure: LEFT NEPHROLITHOTOMY PERCUTANEOUS WITH SURGEON ACCESS, REMOVAL OF RIGHT URETERAL STENT;  Surgeon: Ardis Hughs, MD;  Location: [facility];  Service: Urology;  Laterality: Left;  195 MINUTES   POLYPECTOMY  01/13/2022   Procedure: POLYPECTOMY;  Surgeon: Harvel Quale, MD;  [facility];  Service: Gastroenterology;;   RADIOLOGY WITH ANESTHESIA N/A 10/14/2014   Procedure: ANGIOPLASTY;  Surgeon: Luanne Bras, MD;  Location: [place];  Service: Radiology;  Laterality: N/A;   TUBAL LIGATION      Current Outpatient Medications  Medication Sig Dispense Refill   acetaminophen (TYLENOL) 500 MG tablet Take 500-1,000 mg by mouth every 8 (eight) hours as needed for moderate pain or  headache.      aspirin EC 81 MG tablet Take 81 mg by mouth every morning.     bismuth subsalicylate (PEPTO BISMOL) 262 MG chewable tablet Chew 524 mg by mouth 3 (three) times daily as needed (stomach pain.).     Calcium-Phosphorus-Vitamin D (CITRACAL +D3 PO) Take 2 tablets by mouth in the morning and at bedtime.     clopidogrel (PLAVIX) 75 MG tablet Take 75 mg by mouth in the morning.     Cyanocobalamin (VITAMIN B-12) 5000 MCG SUBL Place 5,000 mcg under the tongue 3 (three) times a week.     docusate sodium (COLACE) 100 MG capsule Take 100 mg by mouth in the morning.     felodipine (PLENDIL) 5 MG 24 hr tablet Take 5 mg by mouth in the morning.     furosemide (LASIX) 40 MG tablet Take 20 mg by mouth 2 (two) times daily.     gabapentin (NEURONTIN) 300 MG capsule Take 300 mg by mouth 2 (two) times daily.  0   lamoTRIgine  (LAMICTAL) 100 MG tablet Take 100 mg by mouth every morning.      levETIRAcetam (KEPPRA) 500 MG tablet Take 250 mg by mouth 2 (two) times daily.     linaclotide (LINZESS) 145 MCG CAPS capsule Take 145 mcg by mouth daily before breakfast.     LORazepam (ATIVAN) 0.5 MG tablet Take 0.5 mg by mouth daily as needed for anxiety.     metFORMIN (GLUCOPHAGE) 1000 MG tablet Take 1,000 mg by mouth 2 (two) times daily.     metoprolol succinate (TOPROL-XL) 25 MG 24 hr tablet Take 1 tablet by mouth daily 90 tablet 2   Multiple Vitamins-Minerals (BARIATRIC MULTIVITAMINS/IRON PO) Take 1 tablet by mouth every evening.     olmesartan (BENICAR) 40 MG tablet Take 40 mg by mouth every morning.      OZEMPIC, 1 MG/DOSE, 2 MG/1.5ML SOPN Inject 1 mg into the skin every 30 (thirty) days.     pantoprazole (PROTONIX) 40 MG tablet Take 1 tablet (40 mg total) by mouth 2 (two) times daily. 60 tablet 2   pioglitazone (ACTOS) 30 MG tablet Take 30 mg by mouth every morning.     potassium chloride (KLOR-CON M) 10 MEQ tablet TAKE 2 TABLETS BY MOUTH DAILY. 180 tablet 3   rosuvastatin (CRESTOR) 40 MG tablet Take 1 tablet (40 mg total) by mouth at bedtime. 90 tablet 1   No current facility-administered medications for this visit.    Allergies as of 05/11/2022 - Review Complete 05/11/2022  Allergen Reaction Noted   Dilaudid [hydromorphone hcl] Nausea And Vomiting 09/28/2014   Crab [shellfish allergy] Swelling 12/12/2012   Other Other (See Comments) 09/25/2014    Family History  Problem Relation Age of Onset   Heart failure Father    Heart disease Father    Heart attack Father        55's   Kidney disease Mother        dialysis for 12 years   Heart attack Mother        76's   Stroke Paternal Grandmother     Social History   Socioeconomic History   Marital status: Married    Spouse name: Not on file   Number of children: Not on file   Years of education: Not on file   Highest education level: Not on file   Occupational History   Not on file  Tobacco Use   Smoking status: Every Day  Packs/day: 0.25    Years: 41.00    Total pack years: 10.25    Types: Cigarettes    Start date: 07/25/1975    Passive exposure: Current   Smokeless tobacco: Never  Vaping Use   Vaping Use: Never used  Substance and Sexual Activity   Alcohol use: Never   Drug use: No   Sexual activity: Yes    Birth control/protection: Other-see comments, Surgical    Comment: Tubal Ligation  Other Topics Concern   Not on file  Social History Narrative   Not on file   Social Determinants of Health   Financial Resource Strain: Not on file  Food Insecurity: Not on file  Transportation Needs: Not on file  Physical Activity: Not on file  Stress: Not on file  Social Connections: Not on file    Review of systems General: negative for malaise, night sweats, fever, chills, weight los Neck: Negative for lumps, goiter, pain and significant neck swelling Resp: Negative for cough, wheezing, dyspnea at rest CV: Negative for chest pain, leg swelling, palpitations, orthopnea GI: denies melena, hematochezia, nausea, vomiting, diarrhea, constipation, dysphagia, odyonophagia, early satiety or unintentional weight loss.  MSK: Negative for joint pain or swelling, back pain, and muscle pain. Derm: Negative for itching or rash Psych: Denies depression, anxiety, memory loss, confusion. No homicidal or suicidal ideation.  Heme: Negative for prolonged bleeding, bruising easily, and swollen nodes. Endocrine: Negative for cold or heat intolerance, polyuria, polydipsia and goiter. Neuro: negative for tremor, gait imbalance, syncope and seizures. The remainder of the review of systems is noncontributory.  Physical Exam: BP 135/88 (BP Location: Left Arm, Patient Position: Sitting, Cuff Size: Large)   Pulse 89   Temp 98.4 F (36.9 C) (Oral)   Ht 5' 6.5" (1.689 m)   Wt 211 lb 8 oz (95.9 kg)   LMP 12/10/2014 Comment: pt states that there  is no chance or pregnancy  BMI 33.63 kg/m  General:   Alert and oriented. No distress noted. Pleasant and cooperative.  Head:  Normocephalic and atraumatic. Eyes:  Conjuctiva clear without scleral icterus. Mouth:  Oral mucosa pink and moist. Good dentition. No lesions. Heart: Normal rate and rhythm, s1 and s2 heart sounds present.  Lungs: Clear lung sounds in all lobes. Respirations equal and unlabored. Abdomen:  +BS, soft, non-tender and non-distended. No rebound or guarding. No HSM or masses noted. Rectal: Gustavus Bryant, CMA present as witness. small perianal lesion with pink tissue at 2'oclock location, no pain or drainage, possibly scarring from previous abscess?  Derm: No palmar erythema or jaundice Msk:  Symmetrical without gross deformities. Normal posture. Extremities:  Without edema. Neurologic:  Alert and  oriented x4 Psych:  Alert and cooperative. Normal mood and affect.  Invalid input(s): "6 MONTHS"   ASSESSMENT: ARYN SAFRAN is a 56 y.o. female presenting today    PLAN:  1. 2. 3.  <ASA ***>  Hold anticoagulants/DM meds ***    Procedure instructions***   Follow Up: ***  Deionte Spivack L. Alver Sorrow, MSN, APRN, AGNP-C Adult-Gerontology Nurse Practitioner [facility]

## 2022-05-15 ENCOUNTER — Ambulatory Visit (INDEPENDENT_AMBULATORY_CARE_PROVIDER_SITE_OTHER): Payer: BC Managed Care – PPO | Admitting: Gastroenterology

## 2022-05-22 DIAGNOSIS — E785 Hyperlipidemia, unspecified: Secondary | ICD-10-CM | POA: Diagnosis not present

## 2022-05-22 DIAGNOSIS — R569 Unspecified convulsions: Secondary | ICD-10-CM | POA: Diagnosis not present

## 2022-05-22 DIAGNOSIS — E1169 Type 2 diabetes mellitus with other specified complication: Secondary | ICD-10-CM | POA: Diagnosis not present

## 2022-05-22 DIAGNOSIS — I2510 Atherosclerotic heart disease of native coronary artery without angina pectoris: Secondary | ICD-10-CM | POA: Diagnosis not present

## 2022-05-22 DIAGNOSIS — G4733 Obstructive sleep apnea (adult) (pediatric): Secondary | ICD-10-CM | POA: Diagnosis not present

## 2022-05-22 DIAGNOSIS — R4181 Age-related cognitive decline: Secondary | ICD-10-CM | POA: Diagnosis not present

## 2022-05-22 DIAGNOSIS — I10 Essential (primary) hypertension: Secondary | ICD-10-CM | POA: Diagnosis not present

## 2022-06-19 ENCOUNTER — Other Ambulatory Visit (INDEPENDENT_AMBULATORY_CARE_PROVIDER_SITE_OTHER): Payer: Self-pay

## 2022-06-19 ENCOUNTER — Other Ambulatory Visit: Payer: Self-pay | Admitting: *Deleted

## 2022-06-19 MED ORDER — POTASSIUM CHLORIDE CRYS ER 10 MEQ PO TBCR: 20.0000 meq | EXTENDED_RELEASE_TABLET | Freq: Every day | ORAL | 3 refills | Status: DC

## 2022-07-10 ENCOUNTER — Telehealth: Payer: Self-pay | Admitting: Cardiology

## 2022-07-10 NOTE — Telephone Encounter (Signed)
Pt would like a callback regarding Halter Monitor that she was to received weeks ago. Please advise

## 2022-07-10 NOTE — Telephone Encounter (Signed)
Patient states that Dr. Berdine Addison was ordering a holter monitor for her - informed patient that she should contact him for more information - we have not received any request or referrals from his office.

## 2022-07-25 ENCOUNTER — Ambulatory Visit (INDEPENDENT_AMBULATORY_CARE_PROVIDER_SITE_OTHER): Payer: BC Managed Care – PPO | Admitting: Gastroenterology

## 2022-07-25 ENCOUNTER — Other Ambulatory Visit (HOSPITAL_BASED_OUTPATIENT_CLINIC_OR_DEPARTMENT_OTHER): Payer: Self-pay

## 2022-07-25 ENCOUNTER — Telehealth: Payer: Self-pay | Admitting: Cardiology

## 2022-07-25 DIAGNOSIS — G4710 Hypersomnia, unspecified: Secondary | ICD-10-CM

## 2022-07-25 NOTE — Telephone Encounter (Signed)
Patient is calling stating Dr. Berdine Addison advised her they have sent the office an order for a holter monitor twice, but she has not been contacted regarding it. Advised pt there is not currently an order in the system for a holter monitor. She is requesting a callback from the nurse regarding this. Please advise.

## 2022-07-25 NOTE — Telephone Encounter (Signed)
Received monitor request- enrolled pt in [employer] for 30 monitor to be shipped to pt. Pt aware.

## 2022-07-25 NOTE — Telephone Encounter (Signed)
Spoke to pt who verbalized understanding and will await monitor.

## 2022-07-25 NOTE — Telephone Encounter (Signed)
Spoke to [place] at the [facility]- Referrals Dept- who will resubmit holter monitor order for pt- did not see an order submitted for monitor.

## 2022-08-03 DIAGNOSIS — R55 Syncope and collapse: Secondary | ICD-10-CM | POA: Diagnosis not present

## 2022-08-07 ENCOUNTER — Other Ambulatory Visit: Payer: Self-pay

## 2022-08-07 ENCOUNTER — Ambulatory Visit: Payer: BC Managed Care – PPO | Attending: Cardiology

## 2022-08-07 DIAGNOSIS — R55 Syncope and collapse: Secondary | ICD-10-CM

## 2022-08-21 ENCOUNTER — Telehealth: Payer: Self-pay | Admitting: Cardiology

## 2022-08-21 DIAGNOSIS — G4733 Obstructive sleep apnea (adult) (pediatric): Secondary | ICD-10-CM | POA: Diagnosis not present

## 2022-08-21 DIAGNOSIS — E785 Hyperlipidemia, unspecified: Secondary | ICD-10-CM | POA: Diagnosis not present

## 2022-08-21 DIAGNOSIS — R569 Unspecified convulsions: Secondary | ICD-10-CM | POA: Diagnosis not present

## 2022-08-21 DIAGNOSIS — I10 Essential (primary) hypertension: Secondary | ICD-10-CM | POA: Diagnosis not present

## 2022-08-21 DIAGNOSIS — I2510 Atherosclerotic heart disease of native coronary artery without angina pectoris: Secondary | ICD-10-CM | POA: Diagnosis not present

## 2022-08-21 DIAGNOSIS — R4181 Age-related cognitive decline: Secondary | ICD-10-CM | POA: Diagnosis not present

## 2022-08-21 DIAGNOSIS — E1169 Type 2 diabetes mellitus with other specified complication: Secondary | ICD-10-CM | POA: Diagnosis not present

## 2022-08-21 NOTE — Telephone Encounter (Signed)
Appointment given for Thursday, 08/24/2022 with Finis Bud, NP in [place] office.  Patient is currently wearing a [employer] 30 day monitor (1/11 - 09/01/2022) - ordered by her pcp.  Last seen by card March 2023.

## 2022-08-21 NOTE — Telephone Encounter (Signed)
Patient c/o Palpitations:  High priority if patient c/o lightheadedness, shortness of breath, or chest pain  How long have you had palpitations/irregular HR/ Afib? Are you having the symptoms now?   No  Are you currently experiencing lightheadedness, SOB or CP?   Lightheaded but not SOB  Do you have a history of afib (atrial fibrillation) or irregular heart rhythm?  No  Have you checked your BP or HR? (document readings if available):             BP 140/73  HR 67 (at doctor's office)  Are you experiencing any other symptoms?  Extremely fatigue, sweating afterward   Patient stated she has flutters and aching in her upper torso.  Patient stated these episodes come and go without warning.  Patient stated she would get a throbbing in her neck which moves down her arm.

## 2022-08-24 ENCOUNTER — Ambulatory Visit: Payer: BC Managed Care – PPO | Attending: Nurse Practitioner | Admitting: Nurse Practitioner

## 2022-08-24 ENCOUNTER — Encounter: Payer: Self-pay | Admitting: Nurse Practitioner

## 2022-08-24 ENCOUNTER — Encounter: Payer: Self-pay | Admitting: *Deleted

## 2022-08-24 VITALS — BP 115/82 | HR 80 | Ht 66.5 in | Wt 212.0 lb

## 2022-08-24 DIAGNOSIS — I10 Essential (primary) hypertension: Secondary | ICD-10-CM

## 2022-08-24 DIAGNOSIS — I2510 Atherosclerotic heart disease of native coronary artery without angina pectoris: Secondary | ICD-10-CM | POA: Diagnosis not present

## 2022-08-24 DIAGNOSIS — E669 Obesity, unspecified: Secondary | ICD-10-CM | POA: Diagnosis not present

## 2022-08-24 DIAGNOSIS — I5032 Chronic diastolic (congestive) heart failure: Secondary | ICD-10-CM | POA: Diagnosis not present

## 2022-08-24 DIAGNOSIS — Z720 Tobacco use: Secondary | ICD-10-CM

## 2022-08-24 DIAGNOSIS — I6529 Occlusion and stenosis of unspecified carotid artery: Secondary | ICD-10-CM

## 2022-08-24 DIAGNOSIS — R55 Syncope and collapse: Secondary | ICD-10-CM | POA: Diagnosis not present

## 2022-08-24 DIAGNOSIS — I739 Peripheral vascular disease, unspecified: Secondary | ICD-10-CM

## 2022-08-24 DIAGNOSIS — R002 Palpitations: Secondary | ICD-10-CM

## 2022-08-24 DIAGNOSIS — R053 Chronic cough: Secondary | ICD-10-CM | POA: Diagnosis not present

## 2022-08-24 DIAGNOSIS — R062 Wheezing: Secondary | ICD-10-CM

## 2022-08-24 DIAGNOSIS — E782 Mixed hyperlipidemia: Secondary | ICD-10-CM

## 2022-08-24 MED ORDER — NICOTINE 21 MG/24HR TD PT24: 21.0000 mg | MEDICATED_PATCH | Freq: Every day | TRANSDERMAL | 0 refills | Status: DC

## 2022-08-24 NOTE — Progress Notes (Signed)
Cardiology Office Note:    Date:  08/24/2022  ID:  Jill [name], DOB 07/14/1966, MRN [id number]  PCP:  Iona Beard, MD   [facility] Cardiologist:  Carlyle Dolly, MD     Referring MD: Iona Beard, MD   CC: Palpitations, presyncope  History of Present Illness:    Jill [name] is a 57 y.o. female with a hx of the following:  CAD Chronic diastolic CHF Hyperlipidemia Hypertension PAD Carotid stenosis Orthostatic dizziness Obesity, s/p bariatric surgery History of past stroke  Patient is a very pleasant 57 year old female past medical history as mentioned above.  Previous cardiovascular history includes previous history of right lower intervention with PTA and stenting of right common iliac and right SFA in 2010. Previous NST was abnormal.  Had follow-up catheterization performed in 2016 that showed mild to moderate nonobstructive CAD.  Followed by vascular for history of carotid stenosis.  Previous history of left ICA angioplasty in 2016.  It has been recommended to continue lifelong DAPT. At previous office visit, she was found to have orthostatic dizziness and Lasix was changed to as needed only, symptoms had resolved.   Last seen by Dr. Carlyle Dolly on October 04, 2021.  She was overall doing very well.  Was compliant with her medications.  It was several years since evaluation by pulmonary for history of OSA.  She was referred to [facility] pulmonary as well as GI for abdominal pain.  No medication changes were made.  Was told to follow-up in 1 year.  Seen by Coletta Memos on January 06, 2022 via telehealth for pending EGD.  She was doing well from a cardiac perspective.  Today she presents for evaluation for palpitations. She is currently wearing a [employer] monitor for evaluation for presyncopal episode, denied LOC. Episode lasted 15-20 minutes, got overheated, diaphoretic and lightheaded, denied any anginal chest pain, described a throbbing/sensation  feeling, noted along shoulders, difficult for patient to describe. Has noticed brief, random episodes of dizziness, feels a "fluttering" sensation with exertion. Denies any anginal chest pain, shortness of breath, syncope, orthopnea, PND, swelling or significant weight changes, acute bleeding, or claudication. Does admit to chronic cough x 3 months and recent wheezing, ready to quit smoking. Denies any other questions or concerns.   Past Medical History:  Diagnosis Date   Anemia    Anxiety    Back pain    complains when laying on hard flat surface   Chronic kidney disease    appt. /w urology 11/10/2014- for a cyst seen on [place]. evaluated was told it was nothing.   Diabetes mellitus    Diabetes II, oral and insulin.   DVT (deep venous thrombosis) (HCC)    right leg '09 or '10   Fibroids    GERD (gastroesophageal reflux disease)    Heart murmur    History of kidney stones    History of stress test    referred for cardiac cath- done here at [facility]- 09/2014   Hypercholesterolemia    Hypertension    Neuromuscular disorder ([place])    neuropathy   PONV (postoperative nausea and vomiting)    Seizures ([place])    04/2010 x1- Dr. Colan Neptune x2 yearly.   Sleep apnea    no longer uses a cpap   Stroke [facility])    '09-had blockage in brain, too deep to operate"occ left lower extremity fatique, occ. strangle, occ left mouth droop"mild"." may have a tendency to smile or cry with no reason" has had 5  strokes last stroke 2014    Past Surgical History:  Procedure Laterality Date   APPENDECTOMY     BIOPSY  11/18/2021   Procedure: BIOPSY;  Surgeon: Harvel Quale, MD;  [facility];  Service: Gastroenterology;;  random colon;   BIOPSY  12/27/2021   Procedure: BIOPSY;  Surgeon: Harvel Quale, MD;  [facility];  Service: Gastroenterology;;   [place]   x2   CHOLECYSTECTOMY     open"gallstones"   COLONOSCOPY WITH  PROPOFOL N/A 11/18/2021   Procedure: COLONOSCOPY WITH PROPOFOL;  Surgeon: Harvel Quale, MD;  [facility];  Service: Gastroenterology;  Laterality: N/A;  [place] N/A 05/10/2017   Procedure: DILATATION & CURETTAGE/HYSTEROSCOPY WITH MYOSURE;  Surgeon: Servando Salina, MD;  Location: [place] [facility];  Service: Gynecology;  Laterality: N/A;   ESOPHAGOGASTRODUODENOSCOPY (EGD) WITH PROPOFOL N/A 12/27/2021   Procedure: ESOPHAGOGASTRODUODENOSCOPY (EGD) WITH PROPOFOL;  Surgeon: Harvel Quale, MD;  [facility];  Service: Gastroenterology;  Laterality: N/A;  100   ESOPHAGOGASTRODUODENOSCOPY (EGD) WITH PROPOFOL N/A 01/13/2022   Procedure: ESOPHAGOGASTRODUODENOSCOPY (EGD) WITH PROPOFOL;  Surgeon: Harvel Quale, MD;  [facility];  Service: Gastroenterology;  Laterality: N/A;  235, per Darius Bump pt knows to arrive at [place]  01/13/2022   Procedure: HEMOSTASIS CLIP PLACEMENT;  Surgeon: Harvel Quale, MD;  [facility];  Service: Gastroenterology;;   INTERVENTIONAL RADIOLOGY PROCEDURE     CT angiography /neck -Dr. Estanislado Pandy.   IR ANGIO INTRA EXTRACRAN SEL COM CAROTID INNOMINATE BILAT MOD SED  02/22/2018   IR ANGIO INTRA EXTRACRAN SEL COM CAROTID INNOMINATE BILAT MOD SED  03/08/2022   IR ANGIO VERTEBRAL SEL VERTEBRAL BILAT MOD SED  02/22/2018   IR ANGIO VERTEBRAL SEL VERTEBRAL UNI R MOD SED  03/08/2022   IR GENERIC HISTORICAL  08/03/2016   IR RADIOLOGIST EVAL & MGMT 08/03/2016 [facility]-INTER[facility] RAD   IR RADIOLOGIST EVAL & MGMT  01/03/2022   LAPAROSCOPIC GASTRIC SLEEVE RESECTION N/A 04/10/2016   Procedure: LAPAROSCOPIC GASTRIC SLEEVE RESECTION WITH UPPER ENDO;  Surgeon: Greer Pickerel, MD;  Location: [facility];  Service: General;  Laterality: N/A;   LEFT HEART CATHETERIZATION WITH CORONARY ANGIOGRAM N/A 09/28/2014   Procedure: LEFT HEART CATHETERIZATION WITH CORONARY ANGIOGRAM;   Surgeon: Jettie Booze, MD;  Location: [facility] CATH LAB;  Service: Cardiovascular;  Laterality: N/A;   NEPHROLITHOTOMY Right 02/01/2022   Procedure: RIGHT NEPHROLITHOTOMY PERCUTANEOUS WITH SURGEON ACCESS;  Surgeon: Ardis Hughs, MD;  Location: [facility];  Service: Urology;  Laterality: Right;   NEPHROLITHOTOMY Left 02/15/2022   Procedure: LEFT NEPHROLITHOTOMY PERCUTANEOUS WITH SURGEON ACCESS, REMOVAL OF RIGHT URETERAL STENT;  Surgeon: Ardis Hughs, MD;  Location: [facility];  Service: Urology;  Laterality: Left;  195 MINUTES   POLYPECTOMY  01/13/2022   Procedure: POLYPECTOMY;  Surgeon: Harvel Quale, MD;  [facility];  Service: Gastroenterology;;   RADIOLOGY WITH ANESTHESIA N/A 10/14/2014   Procedure: ANGIOPLASTY;  Surgeon: Luanne Bras, MD;  Location: [place];  Service: Radiology;  Laterality: N/A;   TUBAL LIGATION      Current Medications: Current Meds  Medication Sig   acetaminophen (TYLENOL) 500 MG tablet Take 500-1,000 mg by mouth every 8 (eight) hours as needed for moderate pain or headache.    aspirin EC 81 MG tablet Take 81 mg by mouth every morning.  bismuth subsalicylate (PEPTO BISMOL) 262 MG chewable tablet Chew 524 mg by mouth 3 (three) times daily as needed (stomach pain.).   Calcium-Phosphorus-Vitamin D (CITRACAL +D3 PO) Take 2 tablets by mouth in the morning and at bedtime.   clopidogrel (PLAVIX) 75 MG tablet Take 75 mg by mouth in the morning.   Cyanocobalamin (VITAMIN B-12) 5000 MCG SUBL Place 5,000 mcg under the tongue 3 (three) times a week.   docusate sodium (COLACE) 100 MG capsule Take 100 mg by mouth in the morning.   donepezil (ARICEPT) 5 MG tablet Take 5 mg by mouth at bedtime.   felodipine (PLENDIL) 5 MG 24 hr tablet Take 5 mg by mouth in the morning.   furosemide (LASIX) 40 MG tablet Take 20 mg by mouth 2 (two) times daily.   gabapentin (NEURONTIN) 300 MG capsule Take 300 mg by mouth 2 (two) times daily.   lamoTRIgine (LAMICTAL) 100  MG tablet Take 100 mg by mouth every morning.    levETIRAcetam (KEPPRA) 500 MG tablet Take 250 mg by mouth 2 (two) times daily.   linaclotide (LINZESS) 145 MCG CAPS capsule Take 145 mcg by mouth daily before breakfast.   LORazepam (ATIVAN) 0.5 MG tablet Take 0.5 mg by mouth daily as needed for anxiety.   metFORMIN (GLUCOPHAGE) 1000 MG tablet Take 1,000 mg by mouth 2 (two) times daily.   metoprolol succinate (TOPROL-XL) 25 MG 24 hr tablet Take 1 tablet by mouth daily   Multiple Vitamins-Minerals (BARIATRIC MULTIVITAMINS/IRON PO) Take 1 tablet by mouth every evening.   nicotine (NICODERM CQ - DOSED IN MG/24 HOURS) 21 mg/24hr patch Place 1 patch (21 mg total) onto the skin daily.   olmesartan (BENICAR) 40 MG tablet Take 40 mg by mouth every morning.    OZEMPIC, 1 MG/DOSE, 2 MG/1.5ML SOPN Inject 1 mg into the skin every 30 (thirty) days.   pantoprazole (PROTONIX) 40 MG tablet Take 1 tablet (40 mg total) by mouth 2 (two) times daily.   pioglitazone (ACTOS) 30 MG tablet Take 30 mg by mouth every morning.   potassium chloride (KLOR-CON M) 10 MEQ tablet Take 2 tablets (20 mEq total) by mouth daily.   rosuvastatin (CRESTOR) 40 MG tablet Take 1 tablet (40 mg total) by mouth at bedtime.     Allergies:   Dilaudid [hydromorphone hcl], Crab [shellfish allergy], and Other   Social History   Socioeconomic History   Marital status: Married    Spouse name: Not on file   Number of children: Not on file   Years of education: Not on file   Highest education level: Not on file  Occupational History   Not on file  Tobacco Use   Smoking status: Every Day    Packs/day: 0.25    Years: 41.00    Total pack years: 10.25    Types: Cigarettes    Start date: 07/25/1975    Passive exposure: Current   Smokeless tobacco: Never  Vaping Use   Vaping Use: Never used  Substance and Sexual Activity   Alcohol use: Never   Drug use: No   Sexual activity: Yes    Birth control/protection: Other-see comments, Surgical     Comment: Tubal Ligation  Other Topics Concern   Not on file  Social History Narrative   Not on file   Social Determinants of Health   Financial Resource Strain: Not on file  Food Insecurity: Not on file  Transportation Needs: Not on file  Physical Activity: Not on file  Stress: Not  on file  Social Connections: Not on file     Family History: The patient's family history includes Heart attack in her father and mother; Heart disease in her father; Heart failure in her father; Kidney disease in her mother; Stroke in her paternal grandmother.  ROS:   Review of Systems  Constitutional: Negative.   HENT: Negative.    Respiratory:  Positive for cough and wheezing. Negative for hemoptysis, sputum production and shortness of breath.   Cardiovascular:  Positive for palpitations. Negative for chest pain, orthopnea, claudication, leg swelling and PND.  Gastrointestinal: Negative.   Genitourinary: Negative.   Musculoskeletal: Negative.   Skin: Negative.   Neurological:  Positive for dizziness. Negative for tingling, tremors, sensory change, speech change, focal weakness, seizures, loss of consciousness, weakness and headaches.       See HPI.   Endo/Heme/Allergies: Negative.     Please see the history of present illness.    All other systems reviewed and are negative.  EKGs/Labs/Other Studies Reviewed:    The following studies were reviewed today:   EKG:  EKG is not ordered today.    Echocardiogram on 12/09/2019:  1. Left ventricular ejection fraction, by estimation, is 60 to 65%. The  left ventricle has normal function. The left ventricle has no regional  wall motion abnormalities. There is moderate left ventricular hypertrophy.  Left ventricular diastolic  parameters are consistent with Grade I diastolic dysfunction (impaired  relaxation).   2. Right ventricular systolic function is normal. The right ventricular  size is normal.   3. The mitral valve is normal in  structure. No evidence of mitral valve  regurgitation. No evidence of mitral stenosis.   4. The aortic valve is tricuspid. Aortic valve regurgitation is not  visualized. No aortic stenosis is present.   5. The inferior vena cava is dilated in size with >50% respiratory  variability, suggesting right atrial pressure of 8 mmHg.   NM Lexiscan stress test on 08/2014: IMPRESSION: 1. Large area of ischemia extending from the basal to distal anteroseptal wall.   2. Normal left ventricular wall motion.   3. Left ventricular ejection fraction 60 for%   4. High-risk stress test findings*.  Recent Labs: 01/23/2022: ALT 14 03/08/2022: BUN 16; Creatinine, Ser 0.90; Hemoglobin 11.3; Platelets 200; Potassium 4.0; Sodium 139  Recent Lipid Panel    Component Value Date/Time   CHOL 166 12/09/2019 0933   TRIG 178 (H) 12/09/2019 0933   HDL 49 12/09/2019 0933   CHOLHDL 3.4 12/09/2019 0933   VLDL 36 12/09/2019 0933   LDLCALC 81 12/09/2019 0933     Physical Exam:    VS:  BP 115/82 (BP Location: Left Arm, Patient Position: Sitting, Cuff Size: Normal)   Pulse 80   Ht 5' 6.5" (1.689 m)   Wt 212 lb (96.2 kg)   LMP 12/10/2014 Comment: pt states that there is no chance or pregnancy  SpO2 95%   BMI 33.71 kg/m     Wt Readings from Last 3 Encounters:  08/24/22 212 lb (96.2 kg)  05/11/22 211 lb 8 oz (95.9 kg)  03/28/22 207 lb (93.9 kg)     GEN: Obese, 57 y.o. female in no acute distress HEENT: Normal NECK: No JVD; No carotid bruits CARDIAC: S1/S2, RRR, no murmurs, rubs, gallops; 2+ pulses RESPIRATORY:  Clear to auscultation without rales, wheezing or rhonchi, dry non-productive cough MUSCULOSKELETAL:  No edema; No deformity  SKIN: Warm and dry NEUROLOGIC:  Alert and oriented x 3 PSYCHIATRIC:  Normal affect  ASSESSMENT:    1. Pre-syncope   2. Palpitations   3. Coronary artery disease involving native coronary artery of native heart without angina pectoris   4. Chronic diastolic CHF  (congestive heart failure) ([place])   5. Mixed hyperlipidemia   6. Essential hypertension   7. PAD (peripheral artery disease) ([place])   8. Stenosis of carotid artery, unspecified laterality   9. Tobacco abuse   10. Chronic cough   11. Wheezing   12. Obesity (BMI 30-39.9)    PLAN:    In order of problems listed above:  Presyncope, palpitations Presyncope etiology sounds due to dehydration/ vasovagal. She is currently wearing a [employer] monitor for evaluation for presyncopal episode, denied LOC. Episode lasted 15-20 minutes, got overheated, diaphoretic and lightheaded, denied any anginal chest pain,  described a throbbing/sensation feeling, noted along shoulders.  Difficult for for patient to describe.  Has noticed brief, random episodes of dizziness, feels a "fluttering" sensation with exertion. Preliminary [employer] monitor report available today that reveals overall unremarkable findings.  Found to be in sinus rhythm without any evidence of A-fib or bradycardia.  There is no patient triggered events. Will await official report. Will arrange 2D Echocardiogram and will request recent labs obtained at PCP's office. Recommended compression stockings, adequate hydration, and slow position changes.   CAD Mild to moderate disease noted by cath in 2016.  Denies any anginal chest pain symptoms, she describes a throbbing/sensation feeling particularly noted along shoulders.  Difficult for patient to describe. Continue aspirin, Plavix, metoprolol succinate, olmesartan, and rosuvastatin. Heart healthy diet and regular cardiovascular exercise encouraged.   Chronic diastolic CHF Echocardiogram in 2021 revealed normal EF, grade 1 DD, moderate LVH.  Will update echocardiogram as mentioned above. Euvolemic and well compensated on exam. Low sodium diet, fluid restriction <2L, and daily weights encouraged. Educated to contact our office for weight gain of 2 lbs overnight or 5 lbs in one week.  Continue current  medication regimen.   HLD Will request recent labwork from PCP.  Continue rosuvastatin. Heart healthy diet and regular cardiovascular exercise encouraged.   HTN BP on arrival, 98/60.  Repeat BP 115/82.  Continue current medication regimen. Discussed to monitor BP at home at least 2 hours after medications and sitting for 5-10 minutes. Given BP log and discussed to notify office if SBP < 100. Heart healthy diet and regular cardiovascular exercise encouraged.   PAD, Carotid stenosis, s/p L ICA angioplasty in 2016 Previous history of right lower intervention with PTA and stenting of right common iliac and right SFA in 2010, L ICA angioplasty in 2016. Followed by vascular.  It has been recommended to continue lifelong DAPT.  Continue current medication regimen.  Continue to follow with vascular.  With request labs from PCP as mentioned above.  Tobacco abuse, chronic cough, wheezing Smoking cessation encouraged and discussed.  She states she is ready to quit smoking.  Will write prescription for nicotine patch.  Recommended to continue to follow-up with pulmonology and PCP. Has upcoming sleep study arranged with Dr. Elsworth Soho.   8. Obesity BMI 33.71. Weight loss via diet and exercise encouraged. Discussed the impact being overweight would have on cardiovascular risk. Plan to discuss PREP program at next OV.   9. Disposition: Follow-up with me or [facility] in 6 to 8 weeks or sooner if anything changes.  Medication Adjustments/Labs and Tests Ordered: Current medicines are reviewed at length with the patient today.  Concerns regarding medicines are outlined above.  Orders Placed This Encounter  Procedures  ECHOCARDIOGRAM COMPLETE   Meds ordered this encounter  Medications   nicotine (NICODERM CQ - DOSED IN MG/24 HOURS) 21 mg/24hr patch    Sig: Place 1 patch (21 mg total) onto the skin daily.    Dispense:  28 patch    Refill:  0    Patient Instructions  Medication Instructions:  Your physician has  recommended you make the following change in your medication:  Start nicotine 21 mg patch/24 hour Continue other medications the same  Labwork: none  Testing/Procedures: Your physician has requested that you have an echocardiogram. Echocardiography is a painless test that uses sound waves to create images of your heart. It provides your doctor with information about the size and shape of your heart and how well your heart's chambers and valves are working. This procedure takes approximately one hour. There are no restrictions for this procedure. Please do NOT wear cologne, perfume, aftershave, or lotions (deodorant is allowed). Please arrive 15 minutes prior to your appointment time.  Follow-Up: Your physician recommends that you schedule a follow-up appointment in: 6-8 weeks  Any Other Special Instructions Will Be Listed Below (If Applicable). Your physician has requested that you regularly monitor and record your blood pressure readings at home. Please use the same machine at the same time of day to check your readings and record them to bring to your follow-up visit.  If you need a refill on your cardiac medications before your next appointment, please call your pharmacy.   SignedFinis Bud, NP  08/26/2022 12:24 PM    [place]

## 2022-08-24 NOTE — Patient Instructions (Addendum)
Medication Instructions:  Your physician has recommended you make the following change in your medication:  Start nicotine 21 mg patch/24 hour Continue other medications the same  Labwork: none  Testing/Procedures: Your physician has requested that you have an echocardiogram. Echocardiography is a painless test that uses sound waves to create images of your heart. It provides your doctor with information about the size and shape of your heart and how well your heart's chambers and valves are working. This procedure takes approximately one hour. There are no restrictions for this procedure. Please do NOT wear cologne, perfume, aftershave, or lotions (deodorant is allowed). Please arrive 15 minutes prior to your appointment time.  Follow-Up: Your physician recommends that you schedule a follow-up appointment in: 6-8 weeks  Any Other Special Instructions Will Be Listed Below (If Applicable). Your physician has requested that you regularly monitor and record your blood pressure readings at home. Please use the same machine at the same time of day to check your readings and record them to bring to your follow-up visit.  If you need a refill on your cardiac medications before your next appointment, please call your pharmacy.

## 2022-08-26 ENCOUNTER — Encounter: Payer: Self-pay | Admitting: Nurse Practitioner

## 2022-09-12 ENCOUNTER — Ambulatory Visit: Payer: BC Managed Care – PPO | Attending: Nurse Practitioner

## 2022-09-12 DIAGNOSIS — I2510 Atherosclerotic heart disease of native coronary artery without angina pectoris: Secondary | ICD-10-CM

## 2022-09-12 DIAGNOSIS — R55 Syncope and collapse: Secondary | ICD-10-CM | POA: Diagnosis not present

## 2022-09-13 LAB — ECHOCARDIOGRAM COMPLETE
AR max vel: 2.15 cm2
AV Area VTI: 2.32 cm2
AV Area mean vel: 2.24 cm2
AV Mean grad: 3.8 mmHg
AV Peak grad: 8 mmHg
Ao pk vel: 1.41 m/s
Area-P 1/2: 2.91 cm2
Calc EF: 67.8 %
MV M vel: 2.06 m/s
MV Peak grad: 17 mmHg
S' Lateral: 2.5 cm
Single Plane A2C EF: 71.4 %
Single Plane A4C EF: 67.1 %

## 2022-09-14 ENCOUNTER — Other Ambulatory Visit (HOSPITAL_COMMUNITY): Payer: Self-pay | Admitting: Family Medicine

## 2022-09-14 DIAGNOSIS — Z1231 Encounter for screening mammogram for malignant neoplasm of breast: Secondary | ICD-10-CM

## 2022-09-20 ENCOUNTER — Ambulatory Visit: Payer: BC Managed Care – PPO | Attending: Family Medicine | Admitting: Pulmonary Disease

## 2022-09-20 DIAGNOSIS — G4736 Sleep related hypoventilation in conditions classified elsewhere: Secondary | ICD-10-CM | POA: Diagnosis not present

## 2022-09-20 DIAGNOSIS — G4710 Hypersomnia, unspecified: Secondary | ICD-10-CM | POA: Diagnosis not present

## 2022-09-20 DIAGNOSIS — R0683 Snoring: Secondary | ICD-10-CM | POA: Insufficient documentation

## 2022-09-20 DIAGNOSIS — G4733 Obstructive sleep apnea (adult) (pediatric): Secondary | ICD-10-CM | POA: Insufficient documentation

## 2022-09-21 ENCOUNTER — Encounter: Payer: Self-pay | Admitting: Radiology

## 2022-09-25 ENCOUNTER — Telehealth: Payer: Self-pay

## 2022-09-25 ENCOUNTER — Encounter: Payer: Self-pay | Admitting: *Deleted

## 2022-09-25 DIAGNOSIS — G4710 Hypersomnia, unspecified: Secondary | ICD-10-CM | POA: Diagnosis not present

## 2022-09-25 DIAGNOSIS — G4733 Obstructive sleep apnea (adult) (pediatric): Secondary | ICD-10-CM | POA: Diagnosis not present

## 2022-09-25 NOTE — Procedures (Signed)
Patient Name: Jill [name], Jill [name] Date: 09/20/2022 Gender: Female D.O.B: [birth year] Age (years): 40 Referring Provider: Not Available Height (inches): 67 Interpreting Physician: Kara Mead MD, ABSM Weight (lbs): 212 RPSGT: Rosebud Poles BMI: 33 MRN: [id number] Neck Size: 14.50 <br> <br> CLINICAL INFORMATION Sleep Study Type: NPSG    Indication for sleep study: reassessmentof OSA after weight loss    Epworth Sleepiness Score: 8    SLEEP STUDY TECHNIQUE As per the AASM Manual for the Scoring of Sleep and Associated Events v2.3 (April 2016) with a hypopnea requiring 4% desaturations.  The channels recorded and monitored were frontal, central and occipital EEG, electrooculogram (EOG), submentalis EMG (chin), nasal and oral airflow, thoracic and abdominal wall motion, anterior tibialis EMG, snore microphone, electrocardiogram, and pulse oximetry.  MEDICATIONS Medications self-administered by patient taken the night of the study : N/A  SLEEP ARCHITECTURE The study was initiated at 11:25:51 PM and ended at 5:58:53 AM.  Sleep onset time was 25.8 minutes and the sleep efficiency was 88.2%. The total sleep time was 346.8 minutes.  Stage REM latency was 57.0 minutes.  The patient spent 1.73% of the night in stage N1 sleep, 31.95% in stage N2 sleep, 48.73% in stage N3 and 17.6% in REM.  Alpha intrusion was absent.  Supine sleep was 39.35%.  RESPIRATORY PARAMETERS The overall apnea/hypopnea index (AHI) was 6.7 per hour. There were 7 total apneas, including 0 obstructive, 7 central and 0 mixed apneas. There were 32 hypopneas and 0 RERAs.  The AHI during Stage REM sleep was 12.8 per hour.  AHI while supine was 12.3 per hour.  The mean oxygen saturation was 90.62%. The minimum SpO2 during sleep was 85.00%.  moderate snoring was noted during this study.  CARDIAC DATA The 2 lead EKG demonstrated sinus rhythm. The mean heart rate was 67.74 beats per minute. Other EKG  findings include: None.   LEG MOVEMENT DATA The total PLMS were 0 with a resulting PLMS index of 0.00. Associated arousal with leg movement index was 0.0 .  IMPRESSIONS - Mild obstructive sleep apnea occurred during this study (AHI = 6.7/h), especially during supine REM sleep - No significant central sleep apnea occurred during this study (CAI = 1.2/h). - Mild oxygen desaturation was noted during this study (Min O2 = 85.00%). - The patient snored with moderate snoring volume. - No cardiac abnormalities were noted during this study. - Clinically significant periodic limb movements did not occur during sleep. No significant associated arousals.   DIAGNOSIS - Obstructive Sleep Apnea (G47.33) - Nocturnal Hypoxemia (G47.36)   RECOMMENDATIONS - Positional therapy avoiding supine position during sleep. - Very mild obstructive sleep apnea. Return to discuss treatment options. - Avoid alcohol, sedatives and other CNS depressants that may worsen sleep apnea and disrupt normal sleep architecture. - Sleep hygiene should be reviewed to assess factors that may improve sleep quality. - Weight management and regular exercise should be initiated or continued if appropriate.    Kara Mead MD Board Certified in [place]

## 2022-09-25 NOTE — Telephone Encounter (Signed)
-----   Message from Rigoberto Noel, MD sent at 09/25/2022  4:00 PM EST ----- Needs FU appt - next available to discuss sleep study results  Jill [name]

## 2022-09-25 NOTE — Telephone Encounter (Signed)
Scheduled patient for next available [facility] office 11/22/22 at 2:00 pm

## 2022-10-03 ENCOUNTER — Other Ambulatory Visit (HOSPITAL_COMMUNITY): Payer: Self-pay | Admitting: Interventional Radiology

## 2022-10-03 DIAGNOSIS — I771 Stricture of artery: Secondary | ICD-10-CM

## 2022-10-05 ENCOUNTER — Ambulatory Visit: Payer: BC Managed Care – PPO | Admitting: Nurse Practitioner

## 2022-10-18 ENCOUNTER — Other Ambulatory Visit: Payer: Self-pay | Admitting: Cardiology

## 2022-10-20 ENCOUNTER — Ambulatory Visit: Payer: BC Managed Care – PPO | Attending: Cardiology | Admitting: Cardiology

## 2022-10-20 VITALS — BP 138/78 | HR 69 | Ht 66.5 in | Wt 214.0 lb

## 2022-10-20 DIAGNOSIS — R42 Dizziness and giddiness: Secondary | ICD-10-CM

## 2022-10-20 MED ORDER — FUROSEMIDE 20 MG PO TABS: 20.0000 mg | ORAL_TABLET | Freq: Every day | ORAL | 3 refills | Status: DC

## 2022-10-20 NOTE — Patient Instructions (Signed)
Medication Instructions:  Your physician has recommended you make the following change in your medication:   - Decrease Lasix to 20 mg tablet once daily. May take an extra 20 mg tablet as needed for swelling  *If you need a refill on your cardiac medications before your next appointment, please call your pharmacy*   Lab Work: None If you have labs (blood work) drawn today and your tests are completely normal, you will receive your results only by: [place] (if you have MyChart) OR A paper copy in the mail If you have any lab test that is abnormal or we need to change your treatment, we will call you to review the results.   Testing/Procedures: None   Follow-Up: At [facility], you and your health needs are our priority.  As part of our continuing mission to provide you with exceptional heart care, we have created designated Provider Care Teams.  These Care Teams include your primary Cardiologist (physician) and Advanced Practice Providers (APPs -  Physician Assistants and Nurse Practitioners) who all work together to provide you with the care you need, when you need it.  We recommend signing up for the patient portal called "MyChart".  Sign up information is provided on this After Visit Summary.  MyChart is used to connect with patients for Virtual Visits (Telemedicine).  Patients are able to view lab/test results, encounter notes, upcoming appointments, etc.  Non-urgent messages can be sent to your provider as well.   To learn more about what you can do with MyChart, go to [email or web address].    Your next appointment:   6 month(s)  Provider:   Carlyle Dolly, MD    Other Instructions

## 2022-10-20 NOTE — Progress Notes (Signed)
Clinical Summary Jill [name] is a 57 y.o.female seen today for follow up of the following medical problems. This is a focused visit on recent symptoms of dizziness and presnycope  .Palpitations/presyncope - Jan 2024 30 day monitor no significant arrhythmias - 08/2022 echo: LVEF 65-70%, no WMAs, grade I dd  - [place] 15-20 episodes over 4-6 months - last episode 3 weeks ago - last episode was [employer] walking around store. Can start to feel weakness in the legs, nonspecific feeling of know episode is coming on. Leaned against wall, felt anxious.  - no true syncope, but has had presyncope.  - always when standing - can feel like heart is fluttering during episodes - did have several episodes with monitor  - 3 bottles of water daily. Sun drops x 2.  - of note has been orthostatic in clinic previously, resolved at that time with lowering her lasix   Other medical problems not addressed this visit    1. Orthostatic dizziness -previously found to be orthostatic in clinic - since changing lasix to prn only, symptosm have resolved. Maintaining adequate hydration.     - no recent symptoms.     2. Obesity - history of bariatric surgery,     3. Abnormal stress test/Chest pain/CAD - as part of a bariatric surgery preop workup she had a nuclear stress that was abnormal - follow up cath 09/2014 showed mild to moderate non-obstructive disease.    - no recent chest pain - compliant with meds     4. Hyperlipidemia       11/2019 TC 166 TG 178 HDL 49 LDL 81 - more recent labs with pcp - she is on crestor 40mg  daily    5. HTN   -she is compliant wth meds   6. PAD - prior right lower intervention with PTA and stenting of right common iliac and right SFA in Jan 2010 -  no recent symptoms.    7. OSA - remains compliant with CPAP - followed by Dr Elsworth Soho - 08/2022 sleep study mild OSA     8. Carotid stenosis - followed by vascular. History of left ICA angioplasty 09/2014 for  which she remains on ASA and plavix for.  - from 09/2020 telephone neuro interventional Dr Estanislado Pandy had recommended lifelong DAPT.  CTA we ordered per Dr Arlean Hopping request and forwarded results to him. - she is not sure what additional f/u she needs from Dr Estanislado Pandy   9. Chronic diastolic HF - controlled, she is taking lasix 40mg  daily.       Past Medical History:  Diagnosis Date   Anemia    Anxiety    Back pain    complains when laying on hard flat surface   Chronic kidney disease    appt. /w urology 11/10/2014- for a cyst seen on [place]. evaluated was told it was nothing.   Diabetes mellitus    Diabetes II, oral and insulin.   DVT (deep venous thrombosis) (HCC)    right leg '09 or '10   Fibroids    GERD (gastroesophageal reflux disease)    Heart murmur    History of kidney stones    History of stress test    referred for cardiac cath- done here at [facility]- 09/2014   Hypercholesterolemia    Hypertension    Neuromuscular disorder ([place])    neuropathy   PONV (postoperative nausea and vomiting)    Seizures ([place])    04/2010 x1- Dr. Colan Neptune x2  yearly.   Sleep apnea    no longer uses a cpap   Stroke [facility])    '09-had blockage in brain, too deep to operate"occ left lower extremity fatique, occ. 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     Allergies  Allergen Reactions   Dilaudid [Hydromorphone Hcl] Nausea And Vomiting   Crab [Shellfish Allergy] Swelling   Other Other (See Comments)    "pt is a difficult IV stick, prefers IV team paged for IV starts"     Current Outpatient Medications  Medication Sig Dispense Refill   acetaminophen (TYLENOL) 500 MG tablet Take 500-1,000 mg by mouth every 8 (eight) hours as needed for moderate pain or headache.      aspirin EC 81 MG tablet Take 81 mg by mouth every morning.     bismuth subsalicylate (PEPTO BISMOL) 262 MG chewable tablet Chew 524 mg by  mouth 3 (three) times daily as needed (stomach pain.).     Calcium-Phosphorus-Vitamin D (CITRACAL +D3 PO) Take 2 tablets by mouth in the morning and at bedtime.     clopidogrel (PLAVIX) 75 MG tablet Take 75 mg by mouth in the morning.     Cyanocobalamin (VITAMIN B-12) 5000 MCG SUBL Place 5,000 mcg under the tongue 3 (three) times a week.     docusate sodium (COLACE) 100 MG capsule Take 100 mg by mouth in the morning.     donepezil (ARICEPT) 5 MG tablet Take 5 mg by mouth at bedtime.     felodipine (PLENDIL) 5 MG 24 hr tablet Take 5 mg by mouth in the morning.     furosemide (LASIX) 40 MG tablet Take 20 mg by mouth 2 (two) times daily.     gabapentin (NEURONTIN) 300 MG capsule Take 300 mg by mouth 2 (two) times daily.  0   lamoTRIgine (LAMICTAL) 100 MG tablet Take 100 mg by mouth every morning.      levETIRAcetam (KEPPRA) 500 MG tablet Take 250 mg by mouth 2 (two) times daily.     linaclotide (LINZESS) 145 MCG CAPS capsule Take 145 mcg by mouth daily before breakfast.     LORazepam (ATIVAN) 0.5 MG tablet Take 0.5 mg by mouth daily as needed for anxiety.     metFORMIN (GLUCOPHAGE) 1000 MG tablet Take 1,000 mg by mouth 2 (two) times daily.     metoprolol succinate (TOPROL-XL) 25 MG 24 hr tablet Take 1 tablet by mouth daily 90 tablet 2   Multiple Vitamins-Minerals (BARIATRIC MULTIVITAMINS/IRON PO) Take 1 tablet by mouth every evening.     nicotine (NICODERM CQ - DOSED IN MG/24 HOURS) 21 mg/24hr patch Place 1 patch (21 mg total) onto the skin daily. 28 patch 0   olmesartan (BENICAR) 40 MG tablet Take 40 mg by mouth every morning.      OZEMPIC, 1 MG/DOSE, 2 MG/1.5ML SOPN Inject 1 mg into the skin every 30 (thirty) days.     pantoprazole (PROTONIX) 40 MG tablet Take 1 tablet (40 mg total) by mouth 2 (two) times daily. 60 tablet 2   pioglitazone (ACTOS) 30 MG tablet Take 30 mg by mouth every morning.     potassium chloride (KLOR-CON M) 10 MEQ tablet TAKE 2 TABLETS BY MOUTH DAILY. 180 tablet 1    rosuvastatin (CRESTOR) 40 MG tablet Take 1 tablet (40 mg total) by mouth at bedtime. 90 tablet 1   No current facility-administered medications for this  visit.     Past Surgical History:  Procedure Laterality Date   APPENDECTOMY     BIOPSY  11/18/2021   Procedure: BIOPSY;  Surgeon: Harvel Quale, MD;  [facility];  Service: Gastroenterology;;  random colon;   BIOPSY  12/27/2021   Procedure: BIOPSY;  Surgeon: Harvel Quale, MD;  [facility];  Service: Gastroenterology;;   [place]   x2   CHOLECYSTECTOMY     open"gallstones"   COLONOSCOPY WITH PROPOFOL N/A 11/18/2021   Procedure: COLONOSCOPY WITH PROPOFOL;  Surgeon: Harvel Quale, MD;  [facility];  Service: Gastroenterology;  Laterality: N/A;  [place] N/A 05/10/2017   Procedure: DILATATION & CURETTAGE/HYSTEROSCOPY WITH MYOSURE;  Surgeon: Servando Salina, MD;  Location: [facility];  Service: Gynecology;  Laterality: N/A;   ESOPHAGOGASTRODUODENOSCOPY (EGD) WITH PROPOFOL N/A 12/27/2021   Procedure: ESOPHAGOGASTRODUODENOSCOPY (EGD) WITH PROPOFOL;  Surgeon: Harvel Quale, MD;  [facility];  Service: Gastroenterology;  Laterality: N/A;  100   ESOPHAGOGASTRODUODENOSCOPY (EGD) WITH PROPOFOL N/A 01/13/2022   Procedure: ESOPHAGOGASTRODUODENOSCOPY (EGD) WITH PROPOFOL;  Surgeon: Harvel Quale, MD;  [facility];  Service: Gastroenterology;  Laterality: N/A;  235, per Darius Bump pt knows to arrive at [place]  01/13/2022   Procedure: HEMOSTASIS CLIP PLACEMENT;  Surgeon: Harvel Quale, MD;  [facility];  Service: Gastroenterology;;   INTERVENTIONAL RADIOLOGY PROCEDURE     CT angiography /neck -Dr. Estanislado Pandy.   IR ANGIO INTRA EXTRACRAN SEL COM CAROTID INNOMINATE BILAT MOD SED  02/22/2018   IR ANGIO  INTRA EXTRACRAN SEL COM CAROTID INNOMINATE BILAT MOD SED  03/08/2022   IR ANGIO VERTEBRAL SEL VERTEBRAL BILAT MOD SED  02/22/2018   IR ANGIO VERTEBRAL SEL VERTEBRAL UNI R MOD SED  03/08/2022   IR GENERIC HISTORICAL  08/03/2016   IR RADIOLOGIST EVAL & MGMT 08/03/2016 [facility]   IR RADIOLOGIST EVAL & MGMT  01/03/2022   LAPAROSCOPIC GASTRIC SLEEVE RESECTION N/A 04/10/2016   Procedure: LAPAROSCOPIC GASTRIC SLEEVE RESECTION WITH UPPER ENDO;  Surgeon: Greer Pickerel, MD;  Location: [facility];  Service: General;  Laterality: N/A;   LEFT HEART CATHETERIZATION WITH CORONARY ANGIOGRAM N/A 09/28/2014   Procedure: LEFT HEART CATHETERIZATION WITH CORONARY ANGIOGRAM;  Surgeon: Jettie Booze, MD;  Location[facility];  Service: Cardiovascular;  Laterality: N/A;   NEPHROLITHOTOMY Right 02/01/2022   Procedure: RIGHT NEPHROLITHOTOMY PERCUTANEOUS WITH SURGEON ACCESS;  Surgeon: Ardis Hughs, MD;  Location: [facility];  Service: Urology;  Laterality: Right;   NEPHROLITHOTOMY Left 02/15/2022   Procedure: LEFT NEPHROLITHOTOMY PERCUTANEOUS WITH SURGEON ACCESS, REMOVAL OF RIGHT URETERAL STENT;  Surgeon: Ardis Hughs, MD;  Location: [facility];  Service: Urology;  Laterality: Left;  195 MINUTES   POLYPECTOMY  01/13/2022   Procedure: POLYPECTOMY;  Surgeon: Harvel Quale, MD;  [facility];  Service: Gastroenterology;;   RADIOLOGY WITH ANESTHESIA N/A 10/14/2014   Procedure: ANGIOPLASTY;  Surgeon: Luanne Bras, MD;  Location: [place];  Service: Radiology;  Laterality: N/A;   TUBAL LIGATION       Allergies  Allergen Reactions   Dilaudid [Hydromorphone Hcl] Nausea And Vomiting   Crab [Shellfish Allergy] Swelling   Other Other (See Comments)    "pt is a difficult IV stick, prefers IV team paged for IV starts"      Family History  Problem  Relation Age of Onset   Heart failure Father    Heart disease Father    Heart attack Father        71's   Kidney disease Mother        dialysis for  26 years   Heart attack Mother        60's   Stroke Paternal Grandmother      Social History Ms. Hert reports that she has been smoking cigarettes. She started smoking about 47 years ago. She has a 10.25 pack-year smoking history. She has been exposed to tobacco smoke. She has never used smokeless tobacco. Ms. Borgman reports no history of alcohol use.   Review of Systems CONSTITUTIONAL: No weight loss, fever, chills, weakness or fatigue.  HEENT: Eyes: No visual loss, blurred vision, double vision or yellow sclerae.No hearing loss, sneezing, congestion, runny nose or sore throat.  SKIN: No rash or itching.  CARDIOVASCULAR: no chest pain, no palpitations.  RESPIRATORY: No shortness of breath, cough or sputum.  GASTROINTESTINAL: No anorexia, nausea, vomiting or diarrhea. No abdominal pain or blood.  GENITOURINARY: No burning on urination, no polyuria NEUROLOGICAL: per hpi MUSCULOSKELETAL: No muscle, back pain, joint pain or stiffness.  LYMPHATICS: No enlarged nodes. No history of splenectomy.  PSYCHIATRIC: No history of depression or anxiety.  ENDOCRINOLOGIC: No reports of sweating, cold or heat intolerance. No polyuria or polydipsia.  Marland Kitchen   Physical Examination Today's Vitals   10/20/22 1116  BP: 138/78  Pulse: 69  SpO2: 97%  Weight: 214 lb (97.1 kg)  Height: 5' 6.5" (1.689 m)   Body mass index is 34.02 kg/m.  Gen: resting comfortably, no acute distress HEENT: no scleral icterus, pupils equal round and reactive, no palptable cervical adenopathy,  CV: RRR, no m/rg, no jvd Resp: Clear to auscultation bilaterally GI: abdomen is soft, non-tender, non-distended, normal bowel sounds, no hepatosplenomegaly MSK: extremities are warm, no edema.  Skin: warm, no rash Neuro:  no focal deficits Psych: appropriate affect   Diagnostic Studies  08/2014 Lexiscan MPI IMPRESSION: 1. Large area of ischemia extending from the basal to distal anteroseptal wall.   2. Normal left  ventricular wall motion.   3. Left ventricular ejection fraction 60 for%   4. High-risk stress test findings*.   09/2014 Cath HEMODYNAMICS:  Aortic pressure was 142/66; LV pressure was 144/7; LVEDP 18.  There was no gradient between the left ventricle and aorta.     ANGIOGRAPHIC DATA:   The left main coronary artery is widely patent.   The left anterior descending artery is a large vessel which wraps around the apex. There is mild atherosclerosis in the mid vessel. There is a large diagonal vessel which arises proximally. There are several small diagonals which are patent.   The left circumflex artery is a large dominant vessel. There is a ramus vessel which is large and widely patent. There is mild disease in the circumflex system. The first obtuse marginal is small but patent.  There is a small left PDA which is patent.   The right coronary artery is a nondominant vessel. In the mid vessel, there is moderate disease.   LEFT VENTRICULOGRAM:  Left ventricular angiogram was not done due to the question of a dye allergy.  LVEDP was 18 mmHg.   IMPRESSIONS:    Normal left main coronary artery. Mild disease in the left anterior descending artery and its branches. Mild disease in the dominant left circumflex artery and its branches. Moderate disease in the nondominant right  coronary artery. Left ventricular systolic function not assessed.  LVEDP 18 mmHg.  6.   False positive nuclear stress test.   Assessment and Plan   1. Dizziness/presyncope - symptoms suggestive of orthostatic dizziness. She has been orthostatic in clinic previously, this had resolved in the past with lowering her diuretic.  - monitor, echo were benign - orthostatics today are abnormal, SBP 157 to 120 with standing. - lower lasix to 20mg  in AM, may take additional 20mg  prn and monitor symptoms - EKG shows NSR      [name], M.D.

## 2022-10-23 ENCOUNTER — Telehealth (INDEPENDENT_AMBULATORY_CARE_PROVIDER_SITE_OTHER): Payer: Self-pay | Admitting: *Deleted

## 2022-10-23 NOTE — Telephone Encounter (Signed)
Patient called and states when she was last seen she was told to let chelsea know if she had another boil come up. 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  Patient states boil came up last night near rectum. No fever. Having some pain. States it is about the size of a dime.   [place].   [phone]

## 2022-10-23 NOTE — Telephone Encounter (Signed)
Per chelsea appt tomorrow at 8:15. Pt was called and notified to come in at 8 am for 8:15 appt tomorrow. Mitzie added her to schedule.

## 2022-10-24 ENCOUNTER — Ambulatory Visit (INDEPENDENT_AMBULATORY_CARE_PROVIDER_SITE_OTHER): Payer: BC Managed Care – PPO | Admitting: Gastroenterology

## 2022-10-24 ENCOUNTER — Encounter (INDEPENDENT_AMBULATORY_CARE_PROVIDER_SITE_OTHER): Payer: Self-pay | Admitting: Gastroenterology

## 2022-10-24 VITALS — BP 107/62 | HR 76 | Temp 99.3°F | Ht 66.5 in | Wt 218.0 lb

## 2022-10-24 DIAGNOSIS — K610 Anal abscess: Secondary | ICD-10-CM | POA: Diagnosis not present

## 2022-10-24 MED ORDER — CIPROFLOXACIN HCL 500 MG PO TABS: 500.0000 mg | ORAL_TABLET | Freq: Two times a day (BID) | ORAL | 0 refills | Status: AC

## 2022-10-24 MED ORDER — METRONIDAZOLE 500 MG PO TABS: 500.0000 mg | ORAL_TABLET | Freq: Two times a day (BID) | ORAL | 0 refills | Status: AC

## 2022-10-24 NOTE — Patient Instructions (Addendum)
I am going to start antibiotic therapy for the abscess you have If you develop worsening pain, fever with temp 100.4 or greater, vomiting or pain is not improved within about 24 hours of antibiotic therapy, please let me know immediately. Keep rectal area clean and dry.  You will take cipro 500mg  twice daily and flagyl 500mg  2x/day, make sure you do not drink alcohol while taking these. You can start a daily probiotic which is over the counter to help counteract any diarrhea these may elicit, make sure this is taken a few hours apart from the antibiotic.   Follow up 1 month

## 2022-10-24 NOTE — Progress Notes (Addendum)
Referring Provider: Iona Beard, MD Primary Care Physician:  Iona Beard, MD Primary GI Physician: Jenetta Downer   Chief Complaint  Patient presents with   Abscess    Has boil near rectum. Came up 2 days ago. having some pain. Has been using otc hemorrhoid cream.    HPI:   Jill [name] is a 57 y.o. female with past medical history of CKD, GERD, hyperlipidemia, hypertension, coronary artery disease status post cath showing mild to moderate nonobstructive disease, chronic diastolic heart failure, diabetes, DVT, sleep apnea, stroke, seizures, obesity status post sleeve gastrectomy, duodenal adenom   Patient presenting today for lesion in rectum  Last seen in October, at that time taking linzess 138mcg and having a BM every 2-3 days, sometimes stools are still harder. Admits she often only takes the linzess every 2-3 days as she forgets it. 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   Recommended to take linzess compliantly, consider 275mcg dosing if 145 not providing good results.  Present:  She reports a "boil" like lesion in rectal area that came up on Saturday. Having some pain but no bleeding from the lesion. Pain is mostly occurring with sitting, denies worsening pain with having a BM. She is using otc hemorrhoid cream which seems to help some with the pain. No drainage of any other sort from the lesion.  usually having a BM every day to every other day. Stools are not very hard and she has not been straining much. Denies subjective fevers, chills, nausea or vomiting. No abdominal discomfort.    Last Colonoscopy:  11/18/21 - Preparation of the colon was inadequate. - Submucosal mass in the proximal ascending colon, possibly external compression. Biopsied-benign - The entire examined colon is normal. Biopsied-normal Last Endoscopy:June 2023 - Normal esophagus. - A  sleeve gastrectomy was found, characterized by healthy appearing mucosa. - Three duodenal polyps. Resected and retrieved. Injected. 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 /w urology 11/10/2014- for a cyst seen on [place]. evaluated was told it was nothing.   Diabetes mellitus    Diabetes II, oral and insulin.   DVT (deep venous thrombosis)    right leg '09 or '10   Fibroids    GERD (gastroesophageal reflux disease)    Heart murmur    History of kidney stones    History of stress test    referred for cardiac cath- done here at [facility]- 09/2014   Hypercholesterolemia    Hypertension    Neuromuscular disorder    neuropathy   PONV (postoperative nausea and vomiting)    Seizures    04/2010 x1- Dr. Colan Neptune x2 yearly.   Sleep apnea    no longer uses a cpap   Stroke    '09-had blockage in brain, too deep to operate"occ left lower extremity fatique, occ. 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    Past Surgical History:  Procedure Laterality Date   APPENDECTOMY     BIOPSY  11/18/2021   Procedure: BIOPSY;  Surgeon: Harvel Quale, MD;  [facility];  Service: Gastroenterology;;  random colon;   BIOPSY  12/27/2021   Procedure: BIOPSY;  Surgeon: Harvel Quale, MD;  [facility];  Service: Gastroenterology;;   [place]   x2   CHOLECYSTECTOMY     open"gallstones"   COLONOSCOPY WITH PROPOFOL N/A 11/18/2021   Procedure: COLONOSCOPY WITH PROPOFOL;  Surgeon: Harvel Quale, MD;  [facility]: [facility];  Service: Gastroenterology;  Laterality: N/A;  [place] N/A 05/10/2017   Procedure: DILATATION & CURETTAGE/HYSTEROSCOPY WITH MYOSURE;  Surgeon: Servando Salina, MD;  Location: [facility];  Service: Gynecology;  Laterality: N/A;   ESOPHAGOGASTRODUODENOSCOPY (EGD) WITH PROPOFOL N/A 12/27/2021   Procedure: ESOPHAGOGASTRODUODENOSCOPY (EGD) WITH PROPOFOL;  Surgeon: Harvel Quale, MD;  [facility];  Service: Gastroenterology;  Laterality: N/A;  100   ESOPHAGOGASTRODUODENOSCOPY (EGD) WITH PROPOFOL N/A 01/13/2022   Procedure: ESOPHAGOGASTRODUODENOSCOPY (EGD) WITH PROPOFOL;  Surgeon: Harvel Quale, MD;  [facility];  Service: Gastroenterology;  Laterality: N/A;  235, per Darius Bump pt knows to arrive at [facility]  01/13/2022   Procedure: HEMOSTASIS CLIP PLACEMENT;  Surgeon: Harvel Quale, MD;  [facility];  Service: Gastroenterology;;   INTERVENTIONAL RADIOLOGY PROCEDURE     CT angiography /neck -Dr. Estanislado Pandy.   IR ANGIO INTRA EXTRACRAN SEL COM CAROTID INNOMINATE BILAT MOD SED  02/22/2018   IR ANGIO INTRA EXTRACRAN SEL COM CAROTID INNOMINATE BILAT MOD SED  03/08/2022   IR ANGIO VERTEBRAL SEL VERTEBRAL BILAT MOD SED  02/22/2018   IR ANGIO VERTEBRAL SEL VERTEBRAL UNI R MOD SED  03/08/2022   IR GENERIC HISTORICAL  08/03/2016   IR RADIOLOGIST EVAL & MGMT 08/03/2016 [facility]   IR RADIOLOGIST EVAL & MGMT  01/03/2022   LAPAROSCOPIC GASTRIC SLEEVE RESECTION N/A 04/10/2016   Procedure: LAPAROSCOPIC GASTRIC SLEEVE RESECTION WITH UPPER ENDO;  Surgeon: Greer Pickerel, MD;  Location: [facility];  Service: General;  Laterality: N/A;   LEFT HEART CATHETERIZATION WITH CORONARY ANGIOGRAM N/A 09/28/2014   Procedure: LEFT HEART CATHETERIZATION WITH CORONARY ANGIOGRAM;  Surgeon: Jettie Booze, MD;  Location: [facility];  Service: Cardiovascular;  Laterality: N/A;   NEPHROLITHOTOMY Right 02/01/2022   Procedure: RIGHT NEPHROLITHOTOMY PERCUTANEOUS WITH SURGEON  ACCESS;  Surgeon: Ardis Hughs, MD;  Location: [facility];  Service: Urology;  Laterality: Right;   NEPHROLITHOTOMY Left 02/15/2022   Procedure: LEFT NEPHROLITHOTOMY PERCUTANEOUS WITH SURGEON ACCESS, REMOVAL OF RIGHT URETERAL STENT;  Surgeon: Ardis Hughs, MD;  Location: [facility];  Service: Urology;  Laterality: Left;  195 MINUTES   POLYPECTOMY  01/13/2022   Procedure: POLYPECTOMY;  Surgeon: Harvel Quale, MD;  [facility];  Service: Gastroenterology;;   RADIOLOGY WITH ANESTHESIA N/A 10/14/2014   Procedure: ANGIOPLASTY;  Surgeon: Luanne Bras, MD;  Location: [facility];  Service: Radiology;  Laterality: N/A;   TUBAL LIGATION      Current Outpatient Medications  Medication Sig Dispense Refill   acetaminophen (TYLENOL) 500 MG tablet Take 500-1,000 mg by mouth every 8 (eight) hours as needed for moderate pain or headache.      aspirin EC 81 MG tablet Take 81 mg by mouth every morning.     bismuth subsalicylate (PEPTO BISMOL) 262 MG chewable tablet Chew 524 mg by mouth 3 (three) times daily as  needed (stomach pain.).     Calcium-Phosphorus-Vitamin D (CITRACAL +D3 PO) Take 2 tablets by mouth in the morning and at bedtime.     clopidogrel (PLAVIX) 75 MG tablet Take 75 mg by mouth in the morning.     Cyanocobalamin (VITAMIN B-12) 5000 MCG SUBL Place 5,000 mcg under the tongue 3 (three) times a week.     docusate sodium (COLACE) 100 MG capsule Take 100 mg by mouth in the morning.     felodipine (PLENDIL) 5 MG 24 hr tablet Take 5 mg by mouth in the morning.     furosemide (LASIX) 20 MG tablet Take 1 tablet (20 mg total) by mouth daily. May take an extra tablet as needed for swelling 90 tablet 3   gabapentin (NEURONTIN) 300 MG capsule Take 300 mg by mouth 2 (two) times daily.  0   lamoTRIgine (LAMICTAL) 100 MG tablet Take 100 mg by mouth every morning.      levETIRAcetam (KEPPRA) 500 MG tablet Take 250 mg by mouth 2 (two) times daily.     linaclotide (LINZESS) 145 MCG CAPS  capsule Take 145 mcg by mouth daily before breakfast.     LORazepam (ATIVAN) 0.5 MG tablet Take 0.5 mg by mouth daily as needed for anxiety.     metFORMIN (GLUCOPHAGE) 500 MG tablet Take 500 mg by mouth 2 (two) times daily.     metoprolol succinate (TOPROL-XL) 25 MG 24 hr tablet Take 1 tablet by mouth daily 90 tablet 2   Multiple Vitamins-Minerals (BARIATRIC MULTIVITAMINS/IRON PO) Take 1 tablet by mouth every evening.     nicotine (NICODERM CQ - DOSED IN MG/24 HOURS) 21 mg/24hr patch Place 1 patch (21 mg total) onto the skin daily. 28 patch 0   olmesartan (BENICAR) 40 MG tablet Take 40 mg by mouth every morning.      OZEMPIC, 1 MG/DOSE, 2 MG/1.5ML SOPN Inject 1 mg into the skin every 30 (thirty) days.     pantoprazole (PROTONIX) 40 MG tablet Take 1 tablet (40 mg total) by mouth 2 (two) times daily. 60 tablet 2   pioglitazone (ACTOS) 30 MG tablet Take 30 mg by mouth every morning.     potassium chloride (KLOR-CON M) 10 MEQ tablet TAKE 2 TABLETS BY MOUTH DAILY. 180 tablet 1   rosuvastatin (CRESTOR) 40 MG tablet Take 1 tablet (40 mg total) by mouth at bedtime. 90 tablet 1   donepezil (ARICEPT) 5 MG tablet Take 5 mg by mouth at bedtime. (Patient not taking: Reported on 10/20/2022)     No current facility-administered medications for this visit.    Allergies as of 10/24/2022 - Review Complete 10/24/2022  Allergen Reaction Noted   Dilaudid [hydromorphone hcl] Nausea And Vomiting 09/28/2014   Crab extract Swelling 08/24/2014   Crab [shellfish allergy] Swelling 12/12/2012   Hydrocodone  10/24/2022   Hydromorphone  04/19/2015   Other Other (See Comments) 09/25/2014    Family History  Problem Relation Age of Onset   Heart failure Father    Heart disease Father    Heart attack Father        69's   Kidney disease Mother        dialysis for 37 years   Heart attack Mother        57's   Stroke Paternal Grandmother     Social History   Socioeconomic History   Marital status: Married     Spouse name: Not on file   Number of children: Not on file  Years of education: Not on file   Highest education level: Not on file  Occupational History   Not on file  Tobacco Use   Smoking status: Every Day    Packs/day: 0.25    Years: 41.00    Additional pack years: 0.00    Total pack years: 10.25    Types: Cigarettes    Start date: 07/25/1975    Passive exposure: Current   Smokeless tobacco: Never  Vaping Use   Vaping Use: Never used  Substance and Sexual Activity   Alcohol use: Never   Drug use: No   Sexual activity: Yes    Birth control/protection: Other-see comments, Surgical    Comment: Tubal Ligation  Other Topics Concern   Not on file  Social History Narrative   Not on file   Social Determinants of Health   Financial Resource Strain: Not on file  Food Insecurity: Not on file  Transportation Needs: Not on file  Physical Activity: Not on file  Stress: Not on file  Social Connections: Not on file    Review of systems General: negative for malaise, night sweats, fever, chills, weight loss Neck: Negative for lumps, goiter, pain and significant neck swelling Resp: Negative for cough, wheezing, dyspnea at rest CV: Negative for chest pain, leg swelling, palpitations, orthopnea GI: denies melena, hematochezia, nausea, vomiting, diarrhea, constipation, dysphagia, odyonophagia, early satiety or unintentional weight loss. +perianal lesion with pain  MSK: Negative for joint pain or swelling, back pain, and muscle pain. Derm: Negative for itching or rash Psych: Denies depression, anxiety, memory loss, confusion. No homicidal or suicidal ideation.  Heme: Negative for prolonged bleeding, bruising easily, and swollen nodes. Endocrine: Negative for cold or heat intolerance, polyuria, polydipsia and goiter. Neuro: negative for tremor, gait imbalance, syncope and seizures. The remainder of the review of systems is noncontributory.  Physical Exam: BP 107/62 (BP Location: Left  Arm, Patient Position: Sitting, Cuff Size: Large)   Pulse 76   Temp 99.3 F (37.4 C) (Oral)   Ht 5' 6.5" (1.689 m)   Wt 218 lb (98.9 kg)   LMP 12/10/2014 Comment: pt states that there is no chance or pregnancy  BMI 34.66 kg/m  General:   Alert and oriented. No distress noted. Pleasant and cooperative.  Head:  Normocephalic and atraumatic. Eyes:  Conjuctiva clear without scleral icterus. Mouth:  Oral mucosa pink and moist. Good dentition. No lesions. Heart: Normal rate and Jill, s1 and s2 heart sounds present.  Lungs: Clear lung sounds in all lobes. Respirations equal and unlabored. Abdomen:  +BS, soft, non-tender and non-distended. No rebound or guarding. No HSM or masses noted. Rectal: wendy richards, LPN present as witness. erythematous induration of approximately 4 cm in diameter at 7 o'clock location just proximal to anus. No drainage noted. Some TTP of area, DRE with normal sphincter tone, no obvious lesions and no pain on DRE.  Derm: No palmar erythema or jaundice Msk:  Symmetrical without gross deformities. Normal posture. Extremities:  Without edema. Neurologic:  Alert and  oriented x4 Psych:  Alert and cooperative. Normal mood and affect.  Invalid input(s): "6 MONTHS"   ASSESSMENT: FRANCESA BRUNELLE is a 57 y.o. female presenting today for perianal abscess.   Perianal abscess noted since Saturday, lesion appears to be superficial based on rectal exam. She has history of the same. Will start 14 day course of cipro and flagyl, she will make me aware if she has any  fever 100.4<, vomiting, worsening of pain. If symptoms not  improved within 1 week, will need to consider surgical referral for possible drainage of lesion.   PLAN:  Cipro 500mg  and flagyl 500mg  BID x14 days  2. Pt to make me aware of fever 100.4<, vomiting, worsening of pain 3. Consider surgical referral if symptoms not improved after 1 week 4. Keep perianal area clean and dry 5. Avoid ETOH with flagyl 6. Start  daily probiotic to prevent diarrhea from abx therapy  All questions were answered, patient verbalized understanding and is in agreement with plan as outlined above.   Follow Up: 1 month   Richel Millspaugh L. Alver Sorrow, MSN, APRN, AGNP-C Adult-Gerontology Nurse Practitioner [facility]  I have reviewed the note and agree with the APP's assessment as described in this progress note  [name], MD Gastroenterology and Hepatology [facility]

## 2022-10-25 ENCOUNTER — Other Ambulatory Visit (HOSPITAL_COMMUNITY): Payer: BC Managed Care – PPO

## 2022-10-27 ENCOUNTER — Ambulatory Visit (HOSPITAL_COMMUNITY): Payer: BC Managed Care – PPO

## 2022-11-02 ENCOUNTER — Ambulatory Visit (HOSPITAL_COMMUNITY): Payer: BC Managed Care – PPO

## 2022-11-03 ENCOUNTER — Encounter (HOSPITAL_COMMUNITY): Payer: Self-pay

## 2022-11-03 ENCOUNTER — Encounter (INDEPENDENT_AMBULATORY_CARE_PROVIDER_SITE_OTHER): Payer: Self-pay

## 2022-11-03 ENCOUNTER — Ambulatory Visit (HOSPITAL_COMMUNITY)
Admission: RE | Admit: 2022-11-03 | Discharge: 2022-11-03 | Disposition: A | Discharge status: Home / Self Care | Payer: BC Managed Care – PPO | Source: Ambulatory Visit | Attending: Family Medicine | Admitting: Family Medicine

## 2022-11-03 DIAGNOSIS — Z1231 Encounter for screening mammogram for malignant neoplasm of breast: Secondary | ICD-10-CM | POA: Insufficient documentation

## 2022-11-05 ENCOUNTER — Other Ambulatory Visit (INDEPENDENT_AMBULATORY_CARE_PROVIDER_SITE_OTHER): Payer: Self-pay | Admitting: Gastroenterology

## 2022-11-05 MED ORDER — FLUCONAZOLE 150 MG PO TABS: 150.0000 mg | ORAL_TABLET | Freq: Every day | ORAL | 0 refills | Status: DC

## 2022-11-13 ENCOUNTER — Ambulatory Visit (INDEPENDENT_AMBULATORY_CARE_PROVIDER_SITE_OTHER): Payer: BC Managed Care – PPO | Admitting: Gastroenterology

## 2022-11-17 ENCOUNTER — Ambulatory Visit (HOSPITAL_COMMUNITY)
Admission: RE | Admit: 2022-11-17 | Discharge: 2022-11-17 | Disposition: A | Discharge status: Home / Self Care | Payer: BC Managed Care – PPO | Source: Ambulatory Visit | Attending: Interventional Radiology | Admitting: Interventional Radiology

## 2022-11-17 DIAGNOSIS — I6522 Occlusion and stenosis of left carotid artery: Secondary | ICD-10-CM | POA: Diagnosis not present

## 2022-11-17 DIAGNOSIS — Z8673 Personal history of transient ischemic attack (TIA), and cerebral infarction without residual deficits: Secondary | ICD-10-CM | POA: Diagnosis not present

## 2022-11-17 DIAGNOSIS — I771 Stricture of artery: Secondary | ICD-10-CM | POA: Diagnosis present

## 2022-11-20 DIAGNOSIS — G4733 Obstructive sleep apnea (adult) (pediatric): Secondary | ICD-10-CM | POA: Diagnosis not present

## 2022-11-20 DIAGNOSIS — E7800 Pure hypercholesterolemia, unspecified: Secondary | ICD-10-CM | POA: Diagnosis not present

## 2022-11-20 DIAGNOSIS — E6609 Other obesity due to excess calories: Secondary | ICD-10-CM | POA: Diagnosis not present

## 2022-11-20 DIAGNOSIS — E1169 Type 2 diabetes mellitus with other specified complication: Secondary | ICD-10-CM | POA: Diagnosis not present

## 2022-11-20 DIAGNOSIS — I10 Essential (primary) hypertension: Secondary | ICD-10-CM | POA: Diagnosis not present

## 2022-11-20 DIAGNOSIS — I2510 Atherosclerotic heart disease of native coronary artery without angina pectoris: Secondary | ICD-10-CM | POA: Diagnosis not present

## 2022-11-20 DIAGNOSIS — R569 Unspecified convulsions: Secondary | ICD-10-CM | POA: Diagnosis not present

## 2022-11-23 ENCOUNTER — Ambulatory Visit (INDEPENDENT_AMBULATORY_CARE_PROVIDER_SITE_OTHER): Payer: BC Managed Care – PPO | Admitting: Pulmonary Disease

## 2022-11-23 ENCOUNTER — Encounter: Payer: Self-pay | Admitting: Pulmonary Disease

## 2022-11-23 VITALS — BP 129/77 | HR 87 | Ht 66.0 in | Wt 215.0 lb

## 2022-11-23 DIAGNOSIS — G4733 Obstructive sleep apnea (adult) (pediatric): Secondary | ICD-10-CM | POA: Diagnosis not present

## 2022-11-23 DIAGNOSIS — K610 Anal abscess: Secondary | ICD-10-CM | POA: Diagnosis not present

## 2022-11-23 DIAGNOSIS — Z903 Acquired absence of stomach [part of]: Secondary | ICD-10-CM | POA: Diagnosis not present

## 2022-11-23 NOTE — Patient Instructions (Signed)
Trial of autoCPAP 5-12 cm, nasal cradle mask to DME

## 2022-11-23 NOTE — Assessment & Plan Note (Addendum)
We discussed the cardiovascular implications of mild OSA are minimal.  Reasons to treat her would be a low energy level and nonrefreshing sleep.  She also has known cerebrovascular disease. We discussed alternative treatment with dental appliance.  She has already used CPAP and is willing to resume We will initiate auto CPAP 5 to 12 cm with nasal cradle mask, review download and tweak settings as needed. We will also evaluate subjective improvement in daytime somnolence and energy levels on her post CPAP visit

## 2022-11-23 NOTE — Progress Notes (Signed)
   Subjective:    Patient ID: Jill [name], female    DOB: 11/28/1965, 57 y.o.   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   PMH -CVA status post PCI for left carotid stenosis, peripheral arterial disease -HFpEF -Diarrhea being evaluated by GI, colonoscopy planned -Status post laparoscopic sleeve gastrectomy  OSA was diagnosed in 2012   Annual follow-up visit. OSA was diagnosed in 2012, baseline sleep study is not available  She weighed 300+ pounds then.  She has lost significant weight after gastric sleeve. We repeated home sleep study and this was suboptimal.  She recently underwent overnight polysomnogram which showed very mild OSA.  We reviewed the results She states that she sleeps okay but still has low energy in the daytime  Significant tests/ events reviewed 12/2021  HST showed very mild  OSA with AHI 6/ hr     NPSG 08/2022 mild , AHI 7/h , esp supine REM, low sat 85% - 212 lbs   Review of Systems neg for any significant sore throat, dysphagia, itching, sneezing, nasal congestion or excess/ purulent secretions, fever, chills, sweats, unintended wt loss, pleuritic or exertional cp, hempoptysis, orthopnea pnd or change in chronic leg swelling. Also denies presyncope, palpitations, heartburn, abdominal pain, nausea, vomiting, diarrhea or change in bowel or urinary habits, dysuria,hematuria, rash, arthralgias, visual complaints, headache, numbness weakness or ataxia.      Objective:   Physical Exam  Gen. Pleasant, obese, in no distress ENT - no lesions, no post nasal drip Neck: No JVD, no thyromegaly, no carotid bruits Lungs: no use of accessory muscles, no dullness to percussion, decreased without rales or rhonchi  Cardiovascular: Rhythm regular, heart sounds  normal, no murmurs or gallops, no peripheral edema Musculoskeletal: No deformities, no cyanosis or clubbing , no tremors        Assessment & Plan:

## 2022-11-27 ENCOUNTER — Telehealth (HOSPITAL_COMMUNITY): Payer: Self-pay

## 2022-11-27 ENCOUNTER — Telehealth: Payer: Self-pay | Admitting: Student

## 2022-11-27 NOTE — Telephone Encounter (Signed)
Imaging reviewed with Dr Corliss Skains prior to calling patient. Imaging was consistent with worsened left ICA supraclinoid stenosis as seen on diagnostic angiogram with Dr Corliss Skains on 03/08/22. A discussion was held with the patient regarding the results, as well as questioning regarding if she was experiencing any symptoms. The patient reports that the only symptom she is experiencing is pain in her left foot and that she is seeking treatment for this from her PCP. Otherwise, patient was in agreement with plan for repeat MRA Head in 6 months.  Kennieth Francois, PA-C 11/27/2022

## 2022-11-27 NOTE — Telephone Encounter (Signed)
Called pt regarding recent imaging, no answer, left vm. AB  

## 2022-11-30 ENCOUNTER — Encounter (INDEPENDENT_AMBULATORY_CARE_PROVIDER_SITE_OTHER): Payer: Self-pay | Admitting: *Deleted

## 2022-12-12 DIAGNOSIS — E113392 Type 2 diabetes mellitus with moderate nonproliferative diabetic retinopathy without macular edema, left eye: Secondary | ICD-10-CM | POA: Diagnosis not present

## 2022-12-12 DIAGNOSIS — H25093 Other age-related incipient cataract, bilateral: Secondary | ICD-10-CM | POA: Diagnosis not present

## 2022-12-14 ENCOUNTER — Ambulatory Visit (INDEPENDENT_AMBULATORY_CARE_PROVIDER_SITE_OTHER): Payer: BC Managed Care – PPO | Admitting: Ophthalmology

## 2022-12-14 ENCOUNTER — Encounter (INDEPENDENT_AMBULATORY_CARE_PROVIDER_SITE_OTHER): Payer: Self-pay | Admitting: Ophthalmology

## 2022-12-14 ENCOUNTER — Encounter (INDEPENDENT_AMBULATORY_CARE_PROVIDER_SITE_OTHER): Payer: Self-pay

## 2022-12-14 DIAGNOSIS — H35033 Hypertensive retinopathy, bilateral: Secondary | ICD-10-CM

## 2022-12-14 DIAGNOSIS — E113593 Type 2 diabetes mellitus with proliferative diabetic retinopathy without macular edema, bilateral: Secondary | ICD-10-CM | POA: Diagnosis not present

## 2022-12-14 DIAGNOSIS — H3561 Retinal hemorrhage, right eye: Secondary | ICD-10-CM | POA: Diagnosis not present

## 2022-12-14 DIAGNOSIS — Z7985 Long-term (current) use of injectable non-insulin antidiabetic drugs: Secondary | ICD-10-CM | POA: Diagnosis not present

## 2022-12-14 DIAGNOSIS — H25813 Combined forms of age-related cataract, bilateral: Secondary | ICD-10-CM | POA: Diagnosis not present

## 2022-12-14 DIAGNOSIS — I10 Essential (primary) hypertension: Secondary | ICD-10-CM | POA: Diagnosis not present

## 2022-12-14 DIAGNOSIS — Z7984 Long term (current) use of oral hypoglycemic drugs: Secondary | ICD-10-CM

## 2022-12-14 MED ORDER — BEVACIZUMAB CHEMO INJECTION 1.25MG/0.05ML SYRINGE FOR KALEIDOSCOPE
1.2500 mg | INTRAVITREAL | Status: AC | PRN
Start: 2022-12-14 — End: 2022-12-14
  Administered 2022-12-14: 1.25 mg via INTRAVITREAL
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
## 2022-12-20 ENCOUNTER — Telehealth (HOSPITAL_COMMUNITY): Payer: Self-pay

## 2022-12-20 NOTE — Telephone Encounter (Signed)
Left vm for pt to f/u in 6months. I will call her when it is time for her next f/u. AB

## 2022-12-21 ENCOUNTER — Encounter: Payer: Self-pay | Admitting: Pulmonary Disease

## 2022-12-21 ENCOUNTER — Telehealth: Payer: Self-pay | Admitting: *Deleted

## 2022-12-21 DIAGNOSIS — G4733 Obstructive sleep apnea (adult) (pediatric): Secondary | ICD-10-CM

## 2022-12-21 NOTE — Telephone Encounter (Signed)
Patient is calling to check in on status of CPAP machine. Patient states she has not received any information. Please call and advise patient. [phone]

## 2022-12-21 NOTE — Telephone Encounter (Signed)
Called pt - please see tele encounter from 12/21/2022

## 2022-12-21 NOTE — Telephone Encounter (Signed)
Called and spoke w/ pt it looks like the order was not placed during her OV for the CPAP - I have placed it now as urgent and let pt know that if she has not heard anything within 1wk to call office back.  She verbalized understanding. NFN at time of call

## 2022-12-21 NOTE — Progress Notes (Signed)
[facility] - Clinic Note  12/25/2022   CHIEF COMPLAINT Patient presents for Retina Follow Up  HISTORY OF PRESENT ILLNESS: Jill [name] is a 57 y.o. female who presents to the clinic today for:  HPI     Retina Follow Up   Patient presents with  Diabetic Retinopathy.  In both eyes.  This started 2 weeks ago.  Duration of 2 weeks.  Since onset it is stable.  I, the attending physician,  performed the HPI with the patient and updated documentation appropriately.        Comments   2 week retina follow up PDR and PRP OD  she has noticed a web over the right eye she denies any flashes       Last edited by Rennis Chris, MD on 12/25/2022 11:43 AM.    Pt is here for PRP  Referring physician: Pecolia Ades, MD [address] [place],  [place] [address]  HISTORICAL INFORMATION:  Selected notes from the MEDICAL RECORD NUMBER Referred by Dr. Ilsa Iha for diabetic retinopathy eval -- new subhyaloid heme OD LEE:  Ocular Hx- PMH-   CURRENT MEDICATIONS: Current Outpatient Medications (Ophthalmic Drugs)  Medication Sig   prednisoLONE acetate (PRED FORTE) 1 % ophthalmic suspension Place 1 drop into the left eye 4 (four) times daily for 7 days.   No current facility-administered medications for this visit. (Ophthalmic Drugs)   Current Outpatient Medications (Other)  Medication Sig   acetaminophen (TYLENOL) 500 MG tablet Take 500-1,000 mg by mouth every 8 (eight) hours as needed for moderate pain or headache.    aspirin EC 81 MG tablet Take 81 mg by mouth every morning.   bismuth subsalicylate (PEPTO BISMOL) 262 MG chewable tablet Chew 524 mg by mouth 3 (three) times daily as needed (stomach pain.).   Calcium-Phosphorus-Vitamin D (CITRACAL +D3 PO) Take 2 tablets by mouth in the morning and at bedtime.   clopidogrel (PLAVIX) 75 MG tablet Take 75 mg by mouth in the morning.   Cyanocobalamin (VITAMIN B-12) 5000 MCG SUBL Place 5,000 mcg under the tongue 3 (three) times a  week.   docusate sodium (COLACE) 100 MG capsule Take 100 mg by mouth in the morning.   felodipine (PLENDIL) 5 MG 24 hr tablet Take 5 mg by mouth in the morning.   fluconazole (DIFLUCAN) 150 MG tablet Take 1 tablet (150 mg total) by mouth daily. If symptoms not improved after 1 dose, can repeat again in 3 days.   furosemide (LASIX) 20 MG tablet Take 1 tablet (20 mg total) by mouth daily. May take an extra tablet as needed for swelling   gabapentin (NEURONTIN) 300 MG capsule Take 300 mg by mouth 2 (two) times daily.   lamoTRIgine (LAMICTAL) 100 MG tablet Take 100 mg by mouth every morning.    levETIRAcetam (KEPPRA) 500 MG tablet Take 250 mg by mouth 2 (two) times daily.   linaclotide (LINZESS) 145 MCG CAPS capsule Take 145 mcg by mouth daily before breakfast.   LORazepam (ATIVAN) 0.5 MG tablet Take 0.5 mg by mouth daily as needed for anxiety.   metFORMIN (GLUCOPHAGE) 500 MG tablet Take 500 mg by mouth 2 (two) times daily.   metoprolol succinate (TOPROL-XL) 25 MG 24 hr tablet Take 1 tablet by mouth daily   Multiple Vitamins-Minerals (BARIATRIC MULTIVITAMINS/IRON PO) Take 1 tablet by mouth every evening.   nicotine (NICODERM CQ - DOSED IN MG/24 HOURS) 21 mg/24hr patch Place 1 patch (21 mg total) onto the skin daily.  olmesartan (BENICAR) 40 MG tablet Take 40 mg by mouth every morning.    OZEMPIC, 1 MG/DOSE, 2 MG/1.5ML SOPN Inject 1 mg into the skin every 30 (thirty) days.   pantoprazole (PROTONIX) 40 MG tablet Take 1 tablet (40 mg total) by mouth 2 (two) times daily.   pioglitazone (ACTOS) 30 MG tablet Take 30 mg by mouth every morning.   potassium chloride (KLOR-CON M) 10 MEQ tablet TAKE 2 TABLETS BY MOUTH DAILY.   rosuvastatin (CRESTOR) 40 MG tablet Take 1 tablet (40 mg total) by mouth at bedtime.   No current facility-administered medications for this visit. (Other)   REVIEW OF SYSTEMS: ROS   Positive for: Gastrointestinal, Neurological, Endocrine, Cardiovascular, Eyes Negative for:  Constitutional, Skin, Genitourinary, Musculoskeletal, HENT, Respiratory, Psychiatric, Allergic/Imm, Heme/Lymph Last edited by Etheleen Mayhew, COT on 12/25/2022  9:48 AM.      ALLERGIES Allergies  Allergen Reactions   Dilaudid [Hydromorphone Hcl] Nausea And Vomiting   Crab Extract Swelling    Crab Cakes   Crab [Shellfish Allergy] Swelling   Hydrocodone    Hydromorphone     Other Reaction(s): GI Intolerance   Other Other (See Comments)    "pt is a difficult IV stick, prefers IV team paged for IV starts"   PAST MEDICAL HISTORY Past Medical History:  Diagnosis Date   Anemia    Anxiety    Back pain    complains when laying on hard flat surface   Chronic kidney disease    appt. /w urology 11/10/2014- for a cyst seen on [place]. evaluated was told it was nothing.   Diabetes mellitus    Diabetes II, oral and insulin.   DVT (deep venous thrombosis) (HCC)    right leg '09 or '10   Fibroids    GERD (gastroesophageal reflux disease)    Heart murmur    History of kidney stones    History of stress test    referred for cardiac cath- done here at [facility]- 09/2014   Hypercholesterolemia    Hypertension    Neuromuscular disorder (HCC)    neuropathy   PONV (postoperative nausea and vomiting)    Seizures (HCC)    04/2010 x1- Dr. Odie Sera x2 yearly.   Sleep apnea    no longer uses a cpap   Stroke [facility])    '09-had blockage in brain, too deep to operate"occ left lower extremity fatique, occ. 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   Past Surgical History:  Procedure Laterality Date   APPENDECTOMY     BIOPSY  11/18/2021   Procedure: BIOPSY;  Surgeon: Dolores Frame, MD;  [facility];  Service: Gastroenterology;;  random colon;   BIOPSY  12/27/2021   Procedure: BIOPSY;  Surgeon: Dolores Frame, MD;  [facility];  Service: Gastroenterology;;   CARDIAC  CATHETERIZATION     CESAREAN SECTION  1986 & 1988   x2   CHOLECYSTECTOMY     open"gallstones"   COLONOSCOPY WITH PROPOFOL N/A 11/18/2021   Procedure: COLONOSCOPY WITH PROPOFOL;  Surgeon: Dolores Frame, MD;  [facility];  Service: Gastroenterology;  Laterality: N/A;  1215   DILATATION & CURETTAGE/HYSTEROSCOPY WITH MYOSURE N/A 05/10/2017   Procedure: DILATATION & CURETTAGE/HYSTEROSCOPY WITH MYOSURE;  Surgeon: Maxie Better, MD;  Location: [facility];  Service: Gynecology;  Laterality: N/A;   ESOPHAGOGASTRODUODENOSCOPY (EGD) WITH PROPOFOL N/A 12/27/2021   Procedure: ESOPHAGOGASTRODUODENOSCOPY (EGD)  WITH PROPOFOL;  Surgeon: Marguerita Merles, Reuel Boom, MD;  [facility];  Service: Gastroenterology;  Laterality: N/A;  100   ESOPHAGOGASTRODUODENOSCOPY (EGD) WITH PROPOFOL N/A 01/13/2022   Procedure: ESOPHAGOGASTRODUODENOSCOPY (EGD) WITH PROPOFOL;  Surgeon: Dolores Frame, MD;  [facility];  Service: Gastroenterology;  Laterality: N/A;  235, per Soledad Gerlach pt knows to arrive at 6:15   HEMOSTASIS CLIP PLACEMENT  01/13/2022   Procedure: HEMOSTASIS CLIP PLACEMENT;  Surgeon: Dolores Frame, MD;  [facility];  Service: Gastroenterology;;   INTERVENTIONAL RADIOLOGY PROCEDURE     CT angiography /neck -Dr. Corliss Skains.   IR ANGIO INTRA EXTRACRAN SEL COM CAROTID INNOMINATE BILAT MOD SED  02/22/2018   IR ANGIO INTRA EXTRACRAN SEL COM CAROTID INNOMINATE BILAT MOD SED  03/08/2022   IR ANGIO VERTEBRAL SEL VERTEBRAL BILAT MOD SED  02/22/2018   IR ANGIO VERTEBRAL SEL VERTEBRAL UNI R MOD SED  03/08/2022   IR GENERIC HISTORICAL  08/03/2016   IR RADIOLOGIST EVAL & MGMT 08/03/2016 [facility]   IR RADIOLOGIST EVAL & MGMT  01/03/2022   LAPAROSCOPIC GASTRIC SLEEVE RESECTION N/A 04/10/2016   Procedure: LAPAROSCOPIC GASTRIC SLEEVE RESECTION WITH UPPER ENDO;  Surgeon: Gaynelle Adu, MD;  Location: [facility];  Service: General;  Laterality: N/A;   LEFT HEART  CATHETERIZATION WITH CORONARY ANGIOGRAM N/A 09/28/2014   Procedure: LEFT HEART CATHETERIZATION WITH CORONARY ANGIOGRAM;  Surgeon: Corky Crafts, MD;  Location: [facility] CATH LAB;  Service: Cardiovascular;  Laterality: N/A;   NEPHROLITHOTOMY Right 02/01/2022   Procedure: RIGHT NEPHROLITHOTOMY PERCUTANEOUS WITH SURGEON ACCESS;  Surgeon: Crist Fat, MD;  Location: [facility];  Service: Urology;  Laterality: Right;   NEPHROLITHOTOMY Left 02/15/2022   Procedure: LEFT NEPHROLITHOTOMY PERCUTANEOUS WITH SURGEON ACCESS, REMOVAL OF RIGHT URETERAL STENT;  Surgeon: Crist Fat, MD;  Location: [facility];  Service: Urology;  Laterality: Left;  195 MINUTES   POLYPECTOMY  01/13/2022   Procedure: POLYPECTOMY;  Surgeon: Dolores Frame, MD;  [facility];  Service: Gastroenterology;;   RADIOLOGY WITH ANESTHESIA N/A 10/14/2014   Procedure: ANGIOPLASTY;  Surgeon: Julieanne Cotton, MD;  Location: [facility] OR;  Service: Radiology;  Laterality: N/A;   TUBAL LIGATION     FAMILY HISTORY Family History  Problem Relation Age of Onset   Cataracts Mother    Kidney disease Mother        dialysis for 26 years   Heart attack Mother        4's   Diabetes Father    Cataracts Father    Heart failure Father    Heart disease Father    Heart attack Father        16's   Stroke Paternal Grandmother    SOCIAL HISTORY Social History   Tobacco Use   Smoking status: Every Day    Packs/day: 0.25    Years: 41.00    Additional pack years: 0.00    Total pack years: 10.25    Types: Cigarettes    Start date: 07/25/1975    Passive exposure: Current   Smokeless tobacco: Never  Vaping Use   Vaping Use: Never used  Substance Use Topics   Alcohol use: Never   Drug use: No       OPHTHALMIC EXAM:  Base Eye Exam     Visual Acuity (Snellen - Linear)       Right Left   Dist cc 20/100 20/30   Dist ph cc NI  Tonometry (Tonopen, 9:54 AM)       Right Left   Pressure 14 15          Pupils       Pupils Dark Light Shape React APD   Right PERRL 2 1 Round Brisk None   Left PERRL 2 1 Round Brisk None         Visual Fields       Left Right    Full Full         Extraocular Movement       Right Left    Full, Ortho Full, Ortho         Neuro/Psych     Oriented x3: Yes   Mood/Affect: Normal         Dilation     Both eyes: 2.5% Phenylephrine @ 9:54 AM           Slit Lamp and Fundus Exam     Slit Lamp Exam       Right Left   Lids/Lashes Dermatochalasis - upper lid Dermatochalasis - upper lid   Conjunctiva/Sclera mild melanosis mild melanosis   Cornea tear film debris tear film debris   Anterior Chamber deep and clear deep and clear   Iris Round and dilated, No NVI Round and dilated, No NVI   Lens 2+ Nuclear sclerosis, 2-3+ Cortical cataract 2+ Nuclear sclerosis, 2-3+ Cortical cataract, 2+ Posterior subcapsular cataract   Anterior Vitreous Vitreous syneresis, diffuse VH, increase in subhyaloid heme Vitreous syneresis         Fundus Exam       Right Left   Disc Pink and Sharp, no NVD, +fibrosis Pink and Sharp, +cupping   C/D Ratio 0.6 0.65   Macula Hazy view, grossly attached, no edema Flat, Good foveal reflex, RPE mottling and clumping, scattered MA/DBH, no frank edema   Vessels attenuated, Tortuous, early patches of NVE inferior midzone attenuated, mild tortuosity, early midzonal NVE   Periphery Attached, boat shaped subhyaloid heme inferior midzone increased and more diffuse Attached, scattered MA           Refraction     Wearing Rx       Sphere Cylinder Axis   Right -2.00 +0.50 178   Left -2.00 +0.75 014           IMAGING AND PROCEDURES  Imaging and Procedures for 12/25/2022  OCT, Retina - OU - Both Eyes       Right Eye Quality was good. Progression has worsened. Findings include normal foveal contour, no IRF, no SRF (Partial PVD with interval increase in subhyaloid hyper reflective material).   Left Eye Quality  was good. Central Foveal Thickness: 218. Progression has been stable. Findings include normal foveal contour, no IRF, no SRF, vitreomacular adhesion (No DME, Mild diffuse atrophy).   Notes *Images captured and stored on drive  Diagnosis / Impression:  No DME OU OD: Partial PVD with interval increase in subhyaloid hyper reflective material -- subhyaloid hemorrhage OS: Mild diffuse atrophy  Clinical management:  See below  Abbreviations: NFP - Normal foveal profile. CME - cystoid macular edema. PED - pigment epithelial detachment. IRF - intraretinal fluid. SRF - subretinal fluid. EZ - ellipsoid zone. ERM - epiretinal membrane. ORA - outer retinal atrophy. ORT - outer retinal tubulation. SRHM - subretinal hyper-reflective material. IRHM - intraretinal hyper-reflective material      Panretinal Photocoagulation - OS - Left Eye       LASER PROCEDURE NOTE  Diagnosis:  Proliferative Diabetic Retinopathy, LEFT EYE  Procedure:  Pan-retinal photocoagulation using slit lamp laser, LEFT EYE  Anesthesia:  Topical  Surgeon: Rennis Chris, MD, PhD   Informed consent obtained, operative eye marked, and time out performed prior to initiation of laser.   Lumenis [id number] slit lamp laser Pattern: 3x3 square Power: 400 mW Duration: 30 msec  Spot size: 200 microns  # spots: 970 spots   Complications: None.  Notes: significant cortical cataract obscuring view and preventing laser up take peripherally and scattered focal areas. Pt also with history of seizures to bright flashing lights -- laser aborted to minimize exposure to bright flashes of light  RTC: June 20th or later -- DFE/OCT, possible injxn OD  Patient tolerated the procedure well and received written and verbal post-procedure care information/education.           ASSESSMENT/PLAN:   ICD-10-CM   1. 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 Subhyaloid hemorrhage of right eye  H35.61     3. Long term (current) use of oral hypoglycemic drugs  Z79.84     4. Long-term (current) use of injectable non-insulin antidiabetic drugs  Z79.85     5. Essential hypertension  I10     6. Hypertensive retinopathy of both eyes  H35.033     7. Combined forms of age-related cataract of both eyes  H25.813       1-4. Proliferative diabetic retinopathy w/o DME, OU  - s/p IVA OD #1 (05.23.24)  - OD with subhyaloid heme - exam shows boat-shaped subhyaloid heme OD -- more diffuse VH today; mild focal NVE OU - FA (05.23.24) shows OD: Boat shaped blockage inferior midzone from subhyaloid heme, focal NVE and vascular non-perfusion IN midzone; OS: Scattered punctate NVE greatest inferior midzone -- pt needs PRP OU - OCT shows OD: Partial PVD with interval increase in subhyaloid hyper reflective material -- subhyaloid hemorrhage; OS: Mild diffuse atrophy - recommend PRP OS today, 06.03.24 - pt wishes to proceed with laser  - RBA of procedure discussed, questions answered - informed consent obtained and signed - see procedure note - f/u June 20th or later -- DFE/OCT, possible injection #2  5,6. Hypertensive retinopathy OU - discussed importance of tight BP control - monitor  7. Mixed Cataract OU - The symptoms of cataract, surgical options, and treatments and risks were discussed with patient. - discussed diagnosis and progression - monitor  Ophthalmic Meds Ordered this visit:  Meds ordered this encounter  Medications   prednisoLONE acetate (PRED FORTE) 1 % ophthalmic suspension    Sig: Place 1 drop into the left eye 4 (four) times daily for 7 days.    Dispense:  5 mL    Refill:  0     Return for f/u June 20th or later, PDR OU, DFE, OCT.  There are no Patient Instructions on file for this visit.  Explained the diagnoses, plan, and follow up with the  patient and they expressed understanding.  Patient expressed understanding of the importance of proper follow up care.   This document serves as a record of services personally performed by Karie Chimera, MD, PhD. It was created on their behalf by Annalee Genta, COMT. The creation of this record is the provider's dictation and/or activities during the visit.  Electronically signed by: Annalee Genta,  COMT 12/25/22 12:09 PM  This document serves as a record of services personally performed by Karie Chimera, MD, PhD. It was created on their behalf by Glee Arvin. Manson Passey, OA an ophthalmic technician. The creation of this record is the provider's dictation and/or activities during the visit.    Electronically signed by: Glee Arvin. Kristopher Oppenheim 06.03.2024 12:09 PM  Karie Chimera, M.D., Ph.D. Diseases & Surgery of the Retina and Vitreous [facility] 12/25/2022  I have reviewed the above documentation for accuracy and completeness, and I agree with the above. Karie Chimera, M.D., Ph.D. 12/25/22 12:10 PM   Abbreviations: M myopia (nearsighted); A astigmatism; H hyperopia (farsighted); P presbyopia; Mrx spectacle prescription;  CTL contact lenses; OD right eye; OS left eye; OU both eyes  XT exotropia; ET esotropia; PEK punctate epithelial keratitis; PEE punctate epithelial erosions; DES dry eye syndrome; MGD meibomian gland dysfunction; ATs artificial tears; PFAT's preservative free artificial tears; NSC nuclear sclerotic cataract; PSC posterior subcapsular cataract; ERM epi-retinal membrane; PVD posterior vitreous detachment; RD retinal detachment; DM diabetes mellitus; DR diabetic retinopathy; NPDR non-proliferative diabetic retinopathy; PDR proliferative diabetic retinopathy; CSME clinically significant macular edema; DME diabetic macular edema; dbh dot blot hemorrhages; CWS cotton wool spot; POAG primary open angle glaucoma; C/D cup-to-disc ratio; HVF humphrey visual field; GVF  goldmann visual field; OCT optical coherence tomography; IOP intraocular pressure; BRVO Branch retinal vein occlusion; CRVO central retinal vein occlusion; CRAO central retinal artery occlusion; BRAO branch retinal artery occlusion; RT retinal tear; SB scleral buckle; PPV pars plana vitrectomy; VH Vitreous hemorrhage; PRP panretinal laser photocoagulation; IVK intravitreal kenalog; VMT vitreomacular traction; MH Macular hole;  NVD neovascularization of the disc; NVE neovascularization elsewhere; AREDS age related eye disease study; ARMD age related macular degeneration; POAG primary open angle glaucoma; EBMD epithelial/anterior basement membrane dystrophy; ACIOL anterior chamber intraocular lens; IOL intraocular lens; PCIOL posterior chamber intraocular lens; Phaco/IOL phacoemulsification with intraocular lens placement; PRK photorefractive keratectomy; LASIK laser assisted in situ keratomileusis; HTN hypertension; DM diabetes mellitus; COPD chronic obstructive pulmonary disease

## 2022-12-22 ENCOUNTER — Telehealth: Payer: Self-pay | Admitting: *Deleted

## 2022-12-22 ENCOUNTER — Telehealth: Payer: Self-pay | Admitting: Cardiology

## 2022-12-22 DIAGNOSIS — K603 Anal fistula: Secondary | ICD-10-CM | POA: Diagnosis not present

## 2022-12-22 NOTE — Telephone Encounter (Signed)
Pt has been scheduled tele pre op appt 01/02/23 @ 2:20. Med rec and consent are done.     Patient Consent for Virtual Visit        Jill [name] has provided verbal consent on 12/22/2022 for a virtual visit (video or telephone).   CONSENT FOR VIRTUAL VISIT FOR:  Jill [name]  By participating in this virtual visit I agree to the following:  I hereby voluntarily request, consent and authorize [facility] and its employed or contracted physicians, physician assistants, nurse practitioners or other licensed health care professionals (the Practitioner), to provide me with telemedicine health care services (the "Services") as deemed necessary by the treating Practitioner. I acknowledge and consent to receive the Services by the Practitioner via telemedicine. I understand that the telemedicine visit will involve communicating with the Practitioner through live audiovisual communication technology and the disclosure of certain medical information by electronic transmission. I acknowledge that I have been given the opportunity to request an in-person assessment or other available alternative prior to the telemedicine visit and am voluntarily participating in the telemedicine visit.  I understand that I have the right to withhold or withdraw my consent to the use of telemedicine in the course of my care at any time, without affecting my right to future care or treatment, and that the Practitioner or I may terminate the telemedicine visit at any time. I understand that I have the right to inspect all information obtained and/or recorded in the course of the telemedicine visit and may receive copies of available information for a reasonable fee.  I understand that some of the potential risks of receiving the Services via telemedicine include:  Delay or interruption in medical evaluation due to technological equipment failure or disruption; Information transmitted may not be sufficient (e.g. poor  resolution of images) to allow for appropriate medical decision making by the Practitioner; and/or  In rare instances, security protocols could fail, causing a breach of personal health information.  Furthermore, I acknowledge that it is my responsibility to provide information about my medical history, conditions and care that is complete and accurate to the best of my ability. I acknowledge that Practitioner's advice, recommendations, and/or decision may be based on factors not within their control, such as incomplete or inaccurate data provided by me or distortions of diagnostic images or specimens that may result from electronic transmissions. I understand that the practice of medicine is not an exact science and that Practitioner makes no warranties or guarantees regarding treatment outcomes. I acknowledge that a copy of this consent can be made available to me via my patient portal [facility] MyChart), or I can request a printed copy by calling the office of [facility].    I understand that my insurance will be billed for this visit.   I have read or had this consent read to me. I understand the contents of this consent, which adequately explains the benefits and risks of the Services being provided via telemedicine.  I have been provided ample opportunity to ask questions regarding this consent and the Services and have had my questions answered to my satisfaction. I give my informed consent for the services to be provided through the use of telemedicine in my medical care

## 2022-12-22 NOTE — Telephone Encounter (Signed)
Pt has been scheduled tele pre op appt 01/02/23 @ 2:20. Med rec and consent are done.

## 2022-12-22 NOTE — Telephone Encounter (Signed)
   Pre-operative Risk Assessment    Patient Name: Jill [name]  DOB: [birth year] MRN: [id number]      Request for Surgical Clearance    Procedure:   Surgical treatment of anal fistula w/placement of seton vs. Fistulotomy surgery in the near future, under general anesthesia.  Date of Surgery:  Clearance TBD                                 Surgeon:  Marin Olp, MD Surgeon's Group or Practice Name:  [facility] Phone number:  [phone] Fax number:  [phone] attn: Doristine Devoid, CMA   Type of Clearance Requested:   - Medical  - Pharmacy:  Hold Clopidogrel (Plavix)     Type of Anesthesia:  General    Additional requests/questions:    [name]   12/22/2022, 12:37 PM

## 2022-12-22 NOTE — Telephone Encounter (Signed)
   Name: Jill [name]  DOB: August 04, 1965  MRN: [id number]  Primary Cardiologist: Dina Rich, MD   Preoperative team, please contact this patient and set up a phone call appointment for further preoperative risk assessment. Please obtain consent and complete medication review. Thank you for your help.  I confirm that guidance regarding antiplatelet and oral anticoagulation therapy has been completed and, if necessary, noted below.  Patient's Plavix is not prescribed by cardiology.  Recommendations for holding Plavix will need to come from prescribing provider.   Ronney Asters, NP 12/22/2022, 12:58 PM [facility]

## 2022-12-25 ENCOUNTER — Ambulatory Visit (INDEPENDENT_AMBULATORY_CARE_PROVIDER_SITE_OTHER): Payer: BC Managed Care – PPO | Admitting: Ophthalmology

## 2022-12-25 ENCOUNTER — Encounter (INDEPENDENT_AMBULATORY_CARE_PROVIDER_SITE_OTHER): Payer: Self-pay | Admitting: Ophthalmology

## 2022-12-25 ENCOUNTER — Telehealth (HOSPITAL_COMMUNITY): Payer: Self-pay

## 2022-12-25 DIAGNOSIS — H3561 Retinal hemorrhage, right eye: Secondary | ICD-10-CM

## 2022-12-25 DIAGNOSIS — Z7985 Long-term (current) use of injectable non-insulin antidiabetic drugs: Secondary | ICD-10-CM

## 2022-12-25 DIAGNOSIS — Z7984 Long term (current) use of oral hypoglycemic drugs: Secondary | ICD-10-CM

## 2022-12-25 DIAGNOSIS — I10 Essential (primary) hypertension: Secondary | ICD-10-CM

## 2022-12-25 DIAGNOSIS — H25813 Combined forms of age-related cataract, bilateral: Secondary | ICD-10-CM | POA: Diagnosis not present

## 2022-12-25 DIAGNOSIS — E113593 Type 2 diabetes mellitus with proliferative diabetic retinopathy without macular edema, bilateral: Secondary | ICD-10-CM

## 2022-12-25 DIAGNOSIS — H35033 Hypertensive retinopathy, bilateral: Secondary | ICD-10-CM | POA: Diagnosis not present

## 2022-12-25 MED ORDER — PREDNISOLONE ACETATE 1 % OP SUSP: 1.0000 [drp] | Freq: Four times a day (QID) | OPHTHALMIC | 0 refills | Status: AC

## 2022-12-25 NOTE — Telephone Encounter (Signed)
-----   Message from Willette Brace, PA-C sent at 12/25/2022  2:48 PM EDT ----- Jill [name],   Dr. Corliss Skains has cleared this patient to be off Plavix for her upcoming surgery.  He states that she must remain on aspirin 81 mg daily, and he recommends resume Plavix 75 mg as soon as her surgery is done.   Please fax this information to [facility] at [phone], Doristine Devoid, CMA.   Thank you,  Aimee

## 2023-01-02 ENCOUNTER — Ambulatory Visit: Payer: BC Managed Care – PPO | Attending: Cardiovascular Disease

## 2023-01-02 DIAGNOSIS — Z01810 Encounter for preprocedural cardiovascular examination: Secondary | ICD-10-CM | POA: Diagnosis not present

## 2023-01-02 NOTE — Progress Notes (Signed)
Virtual Visit via Telephone Note   Because of Jill [name]'s co-morbid illnesses, she is at least at moderate risk for complications without adequate follow up.  This format is felt to be most appropriate for this patient at this time.  The patient did not have access to video technology/had technical difficulties with video requiring transitioning to audio format only (telephone).  All issues noted in this document were discussed and addressed.  No physical exam could be performed with this format.  Please refer to the patient's chart for her consent to telehealth for [facility].  Evaluation Performed:  Preoperative cardiovascular risk assessment _____________   Date:  01/02/2023   Patient ID:  Jill [name], DOB 09-Jan-1966, MRN [id number] Patient Location:  Home Provider location:   Office  Primary Care Provider:  Mirna Mires, MD Primary Cardiologist:  Dina Rich, MD  Chief Complaint / Patient Profile   57 y.o. y/o female with a h/o CAD, chronic diastolic CHF, hyperlipidemia, hypertension, PAD, carotid stenosis, orthostatic dizziness, obesity status post bariatric surgery, history of past stroke (on Plavix) who is pending surgical treatment of anal fistula with placement of set on versus fistulotomy surgery in the near future under general anesthesia and presents today for telephonic preoperative cardiovascular risk assessment.  History of Present Illness    Jill [name] is a 57 y.o. female who presents via audio/video conferencing for a telehealth visit today.  Pt was last seen in cardiology clinic on 10/20/2022 by Dr. Dina Rich.  At that time PAIZLEY PLOTNER was doing well . The patient is now pending procedure as outlined above. Since her last visit, she tells me that since her Lasix was cut in half her dizziness is much better.  She has not had any shortness of breath but does endorse fatigue.  Yesterday, she had a lot to do and was running many errands  throughout the day.  She did feel tired afterwards.  She has been off her CPAP because she feels like she is being smothered or drowning.  Likely, the pressures are too high because she lost around 100 pounds.  Will need to get back with her [facility] doctor to adjust her CPAP settings.  The patient did meet 4 METS on her DASI.  She would really like to get back to swimming when she feels better.  Reviewed most recent echocardiogram in light of her fatigue.  Normal heart pump function back in February with slightly impaired relaxation of the heart.  Otherwise, normal echocardiogram and no significant valvular disease.  Dr. Baxter Hire need to address new CPAP settings.    The patient is on Plavix for history of stroke.  Her neurologist will need to provide clearance and holding parameters surrounding her Plavix.  Past Medical History    Past Medical History:  Diagnosis Date   Anemia    Anxiety    Back pain    complains when laying on hard flat surface   Chronic kidney disease    appt. /w urology 11/10/2014- for a cyst seen on [place]. evaluated was told it was nothing.   Diabetes mellitus    Diabetes II, oral and insulin.   DVT (deep venous thrombosis) (HCC)    right leg '09 or '10   Fibroids    GERD (gastroesophageal reflux disease)    Heart murmur    History of kidney stones    History of stress test    referred for cardiac cath- done here at [facility]- 09/2014  Hypercholesterolemia    Hypertension    Neuromuscular disorder (HCC)    neuropathy   PONV (postoperative nausea and vomiting)    Seizures (HCC)    04/2010 x1- Dr. Odie Sera x2 yearly.   Sleep apnea    no longer uses a cpap   Stroke [facility])    '09-had blockage in brain, too deep to operate"occ left lower extremity fatique, occ. 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   Past Surgical History:  Procedure Laterality Date   APPENDECTOMY     BIOPSY   11/18/2021   Procedure: BIOPSY;  Surgeon: Dolores Frame, MD;  [facility]: [facility];  Service: Gastroenterology;;  random colon;   BIOPSY  12/27/2021   Procedure: BIOPSY;  Surgeon: Dolores Frame, MD;  [facility]: [facility];  Service: Gastroenterology;;   CARDIAC CATHETERIZATION     CESAREAN SECTION  1986 & 1988   x2   CHOLECYSTECTOMY     open"gallstones"   COLONOSCOPY WITH PROPOFOL N/A 11/18/2021   Procedure: COLONOSCOPY WITH PROPOFOL;  Surgeon: Dolores Frame, MD;  [facility];  Service: Gastroenterology;  Laterality: N/A;  1215   DILATATION & CURETTAGE/HYSTEROSCOPY WITH MYOSURE N/A 05/10/2017   Procedure: DILATATION & CURETTAGE/HYSTEROSCOPY WITH MYOSURE;  Surgeon: Maxie Better, MD;  Location: [facility];  Service: Gynecology;  Laterality: N/A;   ESOPHAGOGASTRODUODENOSCOPY (EGD) WITH PROPOFOL N/A 12/27/2021   Procedure: ESOPHAGOGASTRODUODENOSCOPY (EGD) WITH PROPOFOL;  Surgeon: Dolores Frame, MD;  [facility];  Service: Gastroenterology;  Laterality: N/A;  100   ESOPHAGOGASTRODUODENOSCOPY (EGD) WITH PROPOFOL N/A 01/13/2022   Procedure: ESOPHAGOGASTRODUODENOSCOPY (EGD) WITH PROPOFOL;  Surgeon: Dolores Frame, MD;  [facility];  Service: Gastroenterology;  Laterality: N/A;  235, per Soledad Gerlach pt knows to arrive at 6:15   HEMOSTASIS CLIP PLACEMENT  01/13/2022   Procedure: HEMOSTASIS CLIP PLACEMENT;  Surgeon: Dolores Frame, MD;  [facility];  Service: Gastroenterology;;   INTERVENTIONAL RADIOLOGY PROCEDURE     CT angiography /neck -Dr. Corliss Skains.   IR ANGIO INTRA EXTRACRAN SEL COM CAROTID INNOMINATE BILAT MOD SED  02/22/2018   IR ANGIO INTRA EXTRACRAN SEL COM CAROTID INNOMINATE BILAT MOD SED  03/08/2022   IR ANGIO VERTEBRAL SEL VERTEBRAL BILAT MOD SED  02/22/2018   IR ANGIO VERTEBRAL SEL VERTEBRAL UNI R MOD SED  03/08/2022   IR GENERIC HISTORICAL  08/03/2016   IR RADIOLOGIST EVAL & MGMT  08/03/2016 [facility]   IR RADIOLOGIST EVAL & MGMT  01/03/2022   LAPAROSCOPIC GASTRIC SLEEVE RESECTION N/A 04/10/2016   Procedure: LAPAROSCOPIC GASTRIC SLEEVE RESECTION WITH UPPER ENDO;  Surgeon: Gaynelle Adu, MD;  Location: [facility];  Service: General;  Laterality: N/A;   LEFT HEART CATHETERIZATION WITH CORONARY ANGIOGRAM N/A 09/28/2014   Procedure: LEFT HEART CATHETERIZATION WITH CORONARY ANGIOGRAM;  Surgeon: Corky Crafts, MD;  Location: [facility] CATH LAB;  Service: Cardiovascular;  Laterality: N/A;   NEPHROLITHOTOMY Right 02/01/2022   Procedure: RIGHT NEPHROLITHOTOMY PERCUTANEOUS WITH SURGEON ACCESS;  Surgeon: Crist Fat, MD;  Location: [facility];  Service: Urology;  Laterality: Right;   NEPHROLITHOTOMY Left 02/15/2022   Procedure: LEFT NEPHROLITHOTOMY PERCUTANEOUS WITH SURGEON ACCESS, REMOVAL OF RIGHT URETERAL STENT;  Surgeon: Crist Fat, MD;  Location: [facility];  Service: Urology;  Laterality: Left;  195 MINUTES   POLYPECTOMY  01/13/2022   Procedure: POLYPECTOMY;  Surgeon: Dolores Frame, MD;  [facility];  Service: Gastroenterology;;   RADIOLOGY WITH ANESTHESIA N/A 10/14/2014   Procedure: ANGIOPLASTY;  Surgeon: Julieanne Cotton, MD;  Location: [facility];  Service: Radiology;  Laterality: N/A;   TUBAL LIGATION      Allergies  Allergies  Allergen Reactions   Dilaudid [Hydromorphone Hcl] Nausea And Vomiting   Crab Extract Swelling    Crab Cakes   Crab [Shellfish Allergy] Swelling   Hydrocodone    Hydromorphone     Other Reaction(s): GI Intolerance   Other Other (See Comments)    "pt is a difficult IV stick, prefers IV team paged for IV starts"    Home Medications    Prior to Admission medications   Medication Sig Start Date End Date Taking? Authorizing Provider  acetaminophen (TYLENOL) 500 MG tablet Take 500-1,000 mg by mouth every 8 (eight) hours as needed for moderate pain or headache.     [provider]  aspirin EC 81 MG tablet Take 81 mg  by mouth every morning.    [provider]  bismuth subsalicylate (PEPTO BISMOL) 262 MG chewable tablet Chew 524 mg by mouth 3 (three) times daily as needed (stomach pain.).    [provider]  Calcium-Phosphorus-Vitamin D (CITRACAL +D3 PO) Take 2 tablets by mouth in the morning and at bedtime.    [provider]  clopidogrel (PLAVIX) 75 MG tablet Take 75 mg by mouth in the morning.    [provider]  Cyanocobalamin (VITAMIN B-12) 5000 MCG SUBL Place 5,000 mcg under the tongue 3 (three) times a week.    [provider]  docusate sodium (COLACE) 100 MG capsule Take 100 mg by mouth in the morning.    [provider]  felodipine (PLENDIL) 5 MG 24 hr tablet Take 5 mg by mouth in the morning.    [provider]  fluconazole (DIFLUCAN) 150 MG tablet Take 1 tablet (150 mg total) by mouth daily. If symptoms not improved after 1 dose, can repeat again in 3 days. 11/05/22   Carlan, Chelsea L, NP  furosemide (LASIX) 20 MG tablet Take 1 tablet (20 mg total) by mouth daily. May take an extra tablet as needed for swelling 10/20/22 10/15/23  Antoine Poche, MD  gabapentin (NEURONTIN) 300 MG capsule Take 300 mg by mouth 2 (two) times daily. 01/07/16   [provider]  lamoTRIgine (LAMICTAL) 100 MG tablet Take 100 mg by mouth every morning.     [provider]  levETIRAcetam (KEPPRA) 500 MG tablet Take 250 mg by mouth 2 (two) times daily.    [provider]  linaclotide (LINZESS) 145 MCG CAPS capsule Take 145 mcg by mouth daily before breakfast.    [provider]  LORazepam (ATIVAN) 0.5 MG tablet Take 0.5 mg by mouth daily as needed for anxiety.    [provider]  metFORMIN (GLUCOPHAGE) 500 MG tablet Take 500 mg by mouth 2 (two) times daily. 09/27/22   [provider]  metoprolol succinate (TOPROL-XL) 25 MG 24 hr tablet Take 1 tablet by mouth daily 02/18/20   Dyann Kief, PA-C  Multiple  Vitamins-Minerals (BARIATRIC MULTIVITAMINS/IRON PO) Take 1 tablet by mouth every evening.    [provider]  nicotine (NICODERM CQ - DOSED IN MG/24 HOURS) 21 mg/24hr patch Place 1 patch (21 mg total) onto the skin daily. 08/24/22   Sharlene Dory, NP  olmesartan (BENICAR) 40 MG tablet Take 40 mg by mouth every morning.     [provider]  OZEMPIC,  1 MG/DOSE, 2 MG/1.5ML SOPN Inject 1 mg into the skin every 30 (thirty) days. 12/18/19   [provider]  pantoprazole (PROTONIX) 40 MG tablet Take 1 tablet (40 mg total) by mouth 2 (two) times daily. 01/16/22   Dolores Frame, MD  pioglitazone (ACTOS) 30 MG tablet Take 30 mg by mouth every morning.    [provider]  potassium chloride (KLOR-CON M) 10 MEQ tablet TAKE 2 TABLETS BY MOUTH DAILY. 10/18/22   Sharlene Dory, NP  rosuvastatin (CRESTOR) 40 MG tablet Take 1 tablet (40 mg total) by mouth at bedtime. 12/15/19   Dyann Kief, PA-C    Physical Exam    Vital Signs:  JOSEPHYNE CARLONI does not have vital signs available for review today.  Given telephonic nature of communication, physical exam is limited. AAOx3. NAD. Normal affect.  Speech and respirations are unlabored.  Accessory Clinical Findings    None  Assessment & Plan    1.  Preoperative Cardiovascular Risk Assessment:  Ms. Escutia's perioperative risk of a major cardiac event is 11% according to the Revised Cardiac Risk Index (RCRI).  Therefore, she is at high risk for perioperative complications.   Her functional capacity is good at 5.07 METs according to the [place] Activity Status Index (DASI). Recommendations: According to ACC/AHA guidelines, no further cardiovascular testing needed.  The patient may proceed to surgery at acceptable risk.    The patient was advised that if she develops new symptoms prior to surgery to contact our office to arrange for a follow-up visit, and she verbalized understanding.  A copy of this note will be  routed to requesting surgeon.  Time:   Today, I have spent 10 minutes with the patient with telehealth technology discussing medical history, symptoms, and management plan.     Sharlene Dory, PA-C  01/02/2023, 2:21 PM

## 2023-01-11 NOTE — Progress Notes (Signed)
[facility] - Clinic Note  01/15/2023   CHIEF COMPLAINT Patient presents for Retina Follow Up  HISTORY OF PRESENT ILLNESS: Jill [name] is a 57 y.o. female who presents to the clinic today for:  HPI     Retina Follow Up   Patient presents with  Diabetic Retinopathy.  In both eyes.  Severity is moderate.  Duration of 3 weeks.  Since onset it is gradually improving.  I, the attending physician,  performed the HPI with the patient and updated documentation appropriately.        Comments   Pt here for 3 wk ret f/u PDR OU. Pt states VA has improved OD. Not seeing any floaters or dots anymore.       Last edited by Rennis Chris, MD on 01/15/2023 12:11 PM.      Referring physician: Pecolia Ades, MD [id number] [place],  [place] [address]  HISTORICAL INFORMATION:  Selected notes from the MEDICAL RECORD NUMBER Referred by Dr. Ilsa Iha for diabetic retinopathy eval -- new subhyaloid heme OD LEE:  Ocular Hx- PMH-   CURRENT MEDICATIONS: No current outpatient medications on file. (Ophthalmic Drugs)   No current facility-administered medications for this visit. (Ophthalmic Drugs)   Current Outpatient Medications (Other)  Medication Sig   acetaminophen (TYLENOL) 500 MG tablet Take 500-1,000 mg by mouth every 8 (eight) hours as needed for moderate pain or headache.    aspirin EC 81 MG tablet Take 81 mg by mouth every morning.   bismuth subsalicylate (PEPTO BISMOL) 262 MG chewable tablet Chew 524 mg by mouth 3 (three) times daily as needed (stomach pain.).   Calcium-Phosphorus-Vitamin D (CITRACAL +D3 PO) Take 2 tablets by mouth in the morning and at bedtime.   clopidogrel (PLAVIX) 75 MG tablet Take 75 mg by mouth in the morning.   Cyanocobalamin (VITAMIN B-12) 5000 MCG SUBL Place 5,000 mcg under the tongue 3 (three) times a week.   docusate sodium (COLACE) 100 MG capsule Take 100 mg by mouth in the morning.   felodipine (PLENDIL) 5 MG 24 hr tablet Take 5 mg  by mouth in the morning.   fluconazole (DIFLUCAN) 150 MG tablet Take 1 tablet (150 mg total) by mouth daily. If symptoms not improved after 1 dose, can repeat again in 3 days.   furosemide (LASIX) 20 MG tablet Take 1 tablet (20 mg total) by mouth daily. May take an extra tablet as needed for swelling   gabapentin (NEURONTIN) 300 MG capsule Take 300 mg by mouth 2 (two) times daily.   lamoTRIgine (LAMICTAL) 100 MG tablet Take 100 mg by mouth every morning.    levETIRAcetam (KEPPRA) 500 MG tablet Take 250 mg by mouth 2 (two) times daily.   linaclotide (LINZESS) 145 MCG CAPS capsule Take 145 mcg by mouth daily before breakfast.   LORazepam (ATIVAN) 0.5 MG tablet Take 0.5 mg by mouth daily as needed for anxiety.   metFORMIN (GLUCOPHAGE) 500 MG tablet Take 500 mg by mouth 2 (two) times daily.   metoprolol succinate (TOPROL-XL) 25 MG 24 hr tablet Take 1 tablet by mouth daily   Multiple Vitamins-Minerals (BARIATRIC MULTIVITAMINS/IRON PO) Take 1 tablet by mouth every evening.   nicotine (NICODERM CQ - DOSED IN MG/24 HOURS) 21 mg/24hr patch Place 1 patch (21 mg total) onto the skin daily.   olmesartan (BENICAR) 40 MG tablet Take 40 mg by mouth every morning.    OZEMPIC, 1 MG/DOSE, 2 MG/1.5ML SOPN Inject 1 mg into the skin every  30 (thirty) days.   pantoprazole (PROTONIX) 40 MG tablet Take 1 tablet (40 mg total) by mouth 2 (two) times daily.   pioglitazone (ACTOS) 30 MG tablet Take 30 mg by mouth every morning.   potassium chloride (KLOR-CON M) 10 MEQ tablet TAKE 2 TABLETS BY MOUTH DAILY.   rosuvastatin (CRESTOR) 40 MG tablet Take 1 tablet (40 mg total) by mouth at bedtime.   No current facility-administered medications for this visit. (Other)   REVIEW OF SYSTEMS: ROS   Positive for: Gastrointestinal, Neurological, Endocrine, Cardiovascular, Eyes Negative for: Constitutional, Skin, Genitourinary, Musculoskeletal, HENT, Respiratory, Psychiatric, Allergic/Imm, Heme/Lymph Last edited by Thompson Grayer, COT on 01/15/2023  9:22 AM.     ALLERGIES Allergies  Allergen Reactions   Dilaudid [Hydromorphone Hcl] Nausea And Vomiting   Crab Extract Swelling    Crab Cakes   Crab [Shellfish Allergy] Swelling   Hydrocodone    Hydromorphone     Other Reaction(s): GI Intolerance   Other Other (See Comments)    "pt is a difficult IV stick, prefers IV team paged for IV starts"   PAST MEDICAL HISTORY Past Medical History:  Diagnosis Date   Anemia    Anxiety    Back pain    complains when laying on hard flat surface   Chronic kidney disease    appt. /w urology 11/10/2014- for a cyst seen on [place]. evaluated was told it was nothing.   Diabetes mellitus    Diabetes II, oral and insulin.   DVT (deep venous thrombosis) (HCC)    right leg '09 or '10   Fibroids    GERD (gastroesophageal reflux disease)    Heart murmur    History of kidney stones    History of stress test    referred for cardiac cath- done [facility] at [facility]- 09/2014   Hypercholesterolemia    Hypertension    Neuromuscular disorder (HCC)    neuropathy   PONV (postoperative nausea and vomiting)    Seizures (HCC)    04/2010 x1- Dr. Odie Sera x2 yearly.   Sleep apnea    no longer uses a cpap   Stroke [facility])    '09-had blockage in brain, too deep to operate"occ left lower extremity fatique, occ. 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   Past Surgical History:  Procedure Laterality Date   APPENDECTOMY     BIOPSY  11/18/2021   Procedure: BIOPSY;  Surgeon: Dolores Frame, MD;  [facility]: [facility];  Service: Gastroenterology;;  random colon;   BIOPSY  12/27/2021   Procedure: BIOPSY;  Surgeon: Dolores Frame, MD;  [facility]: [facility];  Service: Gastroenterology;;   CARDIAC CATHETERIZATION     CESAREAN SECTION  1986 & 1988   x2   CHOLECYSTECTOMY     open"gallstones"   COLONOSCOPY WITH PROPOFOL N/A 11/18/2021    Procedure: COLONOSCOPY WITH PROPOFOL;  Surgeon: Dolores Frame, MD;  [facility]: [facility];  Service: Gastroenterology;  Laterality: N/A;  1215   DILATATION & CURETTAGE/HYSTEROSCOPY WITH MYOSURE N/A 05/10/2017   Procedure: DILATATION & CURETTAGE/HYSTEROSCOPY WITH MYOSURE;  Surgeon: Maxie Better, MD;  Location: [facility];  Service: Gynecology;  Laterality: N/A;   ESOPHAGOGASTRODUODENOSCOPY (EGD) WITH PROPOFOL N/A 12/27/2021   Procedure: ESOPHAGOGASTRODUODENOSCOPY (EGD) WITH PROPOFOL;  Surgeon: Dolores Frame, MD;  [facility]: [facility];  Service: Gastroenterology;  Laterality: N/A;  100   ESOPHAGOGASTRODUODENOSCOPY (EGD) WITH PROPOFOL N/A 01/13/2022  Procedure: ESOPHAGOGASTRODUODENOSCOPY (EGD) WITH PROPOFOL;  Surgeon: Dolores Frame, MD;  [facility]: [facility];  Service: Gastroenterology;  Laterality: N/A;  235, per Soledad Gerlach pt knows to arrive at 6:15   HEMOSTASIS CLIP PLACEMENT  01/13/2022   Procedure: HEMOSTASIS CLIP PLACEMENT;  Surgeon: Dolores Frame, MD;  [facility]: [facility];  Service: Gastroenterology;;   INTERVENTIONAL RADIOLOGY PROCEDURE     CT angiography /neck -Dr. Corliss Skains.   IR ANGIO INTRA EXTRACRAN SEL COM CAROTID INNOMINATE BILAT MOD SED  02/22/2018   IR ANGIO INTRA EXTRACRAN SEL COM CAROTID INNOMINATE BILAT MOD SED  03/08/2022   IR ANGIO VERTEBRAL SEL VERTEBRAL BILAT MOD SED  02/22/2018   IR ANGIO VERTEBRAL SEL VERTEBRAL UNI R MOD SED  03/08/2022   IR GENERIC HISTORICAL  08/03/2016   IR RADIOLOGIST EVAL & MGMT 08/03/2016 [facility]   IR RADIOLOGIST EVAL & MGMT  01/03/2022   LAPAROSCOPIC GASTRIC SLEEVE RESECTION N/A 04/10/2016   Procedure: LAPAROSCOPIC GASTRIC SLEEVE RESECTION WITH UPPER ENDO;  Surgeon: Gaynelle Adu, MD;  Location: [facility];  Service: General;  Laterality: N/A;   LEFT HEART CATHETERIZATION WITH CORONARY ANGIOGRAM N/A 09/28/2014   Procedure: LEFT HEART CATHETERIZATION WITH CORONARY ANGIOGRAM;  Surgeon: Corky Crafts,  MD;  Location: [facility] CATH LAB;  Service: Cardiovascular;  Laterality: N/A;   NEPHROLITHOTOMY Right 02/01/2022   Procedure: RIGHT NEPHROLITHOTOMY PERCUTANEOUS WITH SURGEON ACCESS;  Surgeon: Crist Fat, MD;  Location: [facility];  Service: Urology;  Laterality: Right;   NEPHROLITHOTOMY Left 02/15/2022   Procedure: LEFT NEPHROLITHOTOMY PERCUTANEOUS WITH SURGEON ACCESS, REMOVAL OF RIGHT URETERAL STENT;  Surgeon: Crist Fat, MD;  Location: [facility];  Service: Urology;  Laterality: Left;  195 MINUTES   POLYPECTOMY  01/13/2022   Procedure: POLYPECTOMY;  Surgeon: Dolores Frame, MD;  [facility];  Service: Gastroenterology;;   RADIOLOGY WITH ANESTHESIA N/A 10/14/2014   Procedure: ANGIOPLASTY;  Surgeon: Julieanne Cotton, MD;  Location: [facility] OR;  Service: Radiology;  Laterality: N/A;   TUBAL LIGATION     FAMILY HISTORY Family History  Problem Relation Age of Onset   Cataracts Mother    Kidney disease Mother        dialysis for 26 years   Heart attack Mother        39's   Diabetes Father    Cataracts Father    Heart failure Father    Heart disease Father    Heart attack Father        82's   Stroke Paternal Grandmother    SOCIAL HISTORY Social History   Tobacco Use   Smoking status: Every Day    Packs/day: 0.25    Years: 41.00    Additional pack years: 0.00    Total pack years: 10.25    Types: Cigarettes    Start date: 07/25/1975    Passive exposure: Current   Smokeless tobacco: Never  Vaping Use   Vaping Use: Never used  Substance Use Topics   Alcohol use: Never   Drug use: No       OPHTHALMIC EXAM:  Base Eye Exam     Visual Acuity (Snellen - Linear)       Right Left   Dist cc 20/30 20/25 -1   Dist ph cc NI NI    Correction: Glasses         Tonometry (Tonopen, 9:27 AM)       Right Left   Pressure 14 16  Pupils       Pupils Dark Light Shape React APD   Right PERRL 2 1 Round Brisk None   Left PERRL 2 1 Round Brisk None          Visual Fields (Counting fingers)       Left Right    Full Full         Extraocular Movement       Right Left    Full, Ortho Full, Ortho         Neuro/Psych     Oriented x3: Yes   Mood/Affect: Normal         Dilation     Both eyes: 1.0% Mydriacyl, 2.5% Phenylephrine @ 9:28 AM           Slit Lamp and Fundus Exam     Slit Lamp Exam       Right Left   Lids/Lashes Dermatochalasis - upper lid Dermatochalasis - upper lid   Conjunctiva/Sclera mild melanosis mild melanosis   Cornea tear film debris tear film debris   Anterior Chamber deep and clear deep and clear   Iris Round and dilated, No NVI Round and dilated, No NVI   Lens 2+ Nuclear sclerosis, 2-3+ Cortical cataract 2+ Nuclear sclerosis, 2-3+ Cortical cataract, 2+ Posterior subcapsular cataract   Anterior Vitreous Vitreous syneresis, diffuse VH, interval mass improvement in vitreous and subhyaloid heme, trace VH remnants settled inferiorly Vitreous syneresis         Fundus Exam       Right Left   Disc Pink and Sharp, no NVD, +fibrosis Pink and Sharp, +cupping   C/D Ratio 0.5 0.65   Macula Flat, Good foveal reflex, RPE mottling and clumping, No heme or edema Flat, Good foveal reflex, RPE mottling and clumping, scattered MA/DBH, no frank edema   Vessels attenuated, Tortuous, early patches of NVE inferior midzone attenuated, mild tortuosity, early midzonal NVE   Periphery Attached, boat shaped subhyaloid heme inferior midzone increased and more diffuse Attached, scattered MA, white pre retinal heme inferior to disc, good PRP changes nasal hemisphere           Refraction     Wearing Rx       Sphere Cylinder Axis   Right -2.00 +0.50 178   Left -2.00 +0.75 014           IMAGING AND PROCEDURES  Imaging and Procedures for 01/15/2023  OCT, Retina - OU - Both Eyes       Right Eye Quality was good. Central Foveal Thickness: 208. Progression has improved. Findings include normal foveal  contour, no IRF, no SRF (Partial PVD with significant interval improvement in subhyaloid hyper reflective material).   Left Eye Quality was good. Central Foveal Thickness: 226. Progression has worsened. Findings include normal foveal contour, no IRF, no SRF, vitreomacular adhesion (No DME, Mild diffuse atrophy, new pre-retinal hyper reflective material inferior to disc-- seen best on widefield).   Notes *Images captured and stored on drive  Diagnosis / Impression:  No DME OU OD: Partial PVD with significant interval improvement in subhyaloid hyper reflective material / subhyaloid heme OS: No DME, Mild diffuse atrophy, new pre-retinal hyper reflective material inferior to disc -- seen best on widefield  Clinical management:  See below  Abbreviations: NFP - Normal foveal profile. CME - cystoid macular edema. PED - pigment epithelial detachment. IRF - intraretinal fluid. SRF - subretinal fluid. EZ - ellipsoid zone. ERM - epiretinal membrane. ORA - outer retinal atrophy. ORT -  outer retinal tubulation. SRHM - subretinal hyper-reflective material. IRHM - intraretinal hyper-reflective material      Intravitreal Injection, Pharmacologic Agent - OD - Right Eye       Time Out 01/15/2023. 10:24 AM. Confirmed correct patient, procedure, site, and patient consented.   Anesthesia Topical anesthesia was used. Anesthetic medications included Lidocaine 2%, Proparacaine 0.5%.   Procedure Preparation included 5% betadine to ocular surface, eyelid speculum. A (32g) needle was used.   Injection: 1.25 mg Bevacizumab 1.25mg /0.95ml   Route: Intravitreal, Site: Right Eye   NDC: P3213405, Lot: [id number], Expiration date: 04/20/2023   Post-op Post injection exam found visual acuity of at least counting fingers. The patient tolerated the procedure well. There were no complications. The patient received written and verbal post procedure care education.      Intravitreal Injection, Pharmacologic Agent  - OS - Left Eye       Time Out 01/15/2023. 10:35 AM. Confirmed correct patient, procedure, site, and patient consented.   Anesthesia Topical anesthesia was used. Anesthetic medications included Lidocaine 2%, Proparacaine 0.5%.   Procedure Preparation included 5% betadine to ocular surface, eyelid speculum. A supplied needle was used.   Injection: 1.25 mg Bevacizumab 1.25mg /0.76ml   Route: Intravitreal, Site: Left Eye   NDC: P3213405, Lot: [id number], Expiration date: 03/05/2023   Post-op Post injection exam found visual acuity of at least counting fingers. The patient tolerated the procedure well. There were no complications. The patient received written and verbal post procedure care education.           ASSESSMENT/PLAN:   ICD-10-CM   1. 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 Subhyaloid hemorrhage of right eye  H35.61     3. Long term (current) use of oral hypoglycemic drugs  Z79.84     4. Long-term (current) use of injectable non-insulin antidiabetic drugs  Z79.85     5. Essential hypertension  I10     6. Hypertensive retinopathy of both eyes  H35.033     7. Combined forms of age-related cataract of both eyes  H25.813        1-4. Proliferative diabetic retinopathy w/o DME, OU  - s/p IVA OD #1 (05.23.24)  - s/p PRP OS (06.03.24) -- incomplete / aborted due to history of photogenic seizures  - OD with subhyaloid heme -- improved; OS with new subhyaloid heme today - FA (05.23.24) shows OD: Boat shaped blockage inferior midzone from subhyaloid heme, focal NVE and vascular non-perfusion IN midzone; OS: Scattered punctate NVE greatest inferior midzone  -- pt needs PRP OU - OCT shows OD: Partial PVD with significant interval improvement in subhyaloid hyper reflective material -- subhyaloid hemorrhage; OS: No DME, Mild diffuse atrophy, new pre-retinal hyper reflective material inferior to disc -- seen best on widefield  - recommend IVA OU (OD #2 and OS #1) today, 06.24.24 for PDR and subhyaloid hemes - pt wishes to proceed  - RBA of procedure discussed, questions answered - informed consent obtained and signed - see procedure note - discussed benefit of PRP laser OU and concern with history of photogenic seizures -- may need to have laser in OR under general  anesthesia - f/u 4 weeks -- DFE/OCT, repeat FA (transit OD)  5,6. Hypertensive retinopathy OU - discussed importance of tight BP control - monitor  7. Mixed Cataract OU - The symptoms of cataract, surgical options, and treatments and risks were discussed with patient. - discussed diagnosis and progression - monitor  8. Hx of photogenic seizures  Ophthalmic Meds Ordered this visit:  Meds ordered this encounter  Medications   Bevacizumab (AVASTIN) SOLN 1.25 mg   Bevacizumab (AVASTIN) SOLN 1.25 mg     Return in about 4 weeks (around 02/12/2023) for f/u PDR OU, DFE, OCT.  There are no Patient Instructions on file for this visit.  Explained the diagnoses, plan, and follow up with the patient and they expressed understanding.  Patient expressed understanding of the importance of proper follow up care.   This document serves as a record of services personally performed by Karie Chimera, MD, PhD. It was created on their behalf by Annalee Genta, COMT. The creation of this record is the provider's dictation and/or activities during the visit.  Electronically signed by: Annalee Genta, COMT 01/15/23 12:11 PM  This document serves as a record of services personally performed by Karie Chimera, MD, PhD. It was created on their behalf by Glee Arvin. Manson Passey, OA an ophthalmic technician. The  creation of this record is the provider's dictation and/or activities during the visit.    Electronically signed by: Glee Arvin. Manson Passey, [place] 06.24.2024 12:11 PM   Karie Chimera, M.D., Ph.D. Diseases & Surgery of the Retina and Vitreous [facility] 01/15/2023  I have reviewed the above documentation for accuracy and completeness, and I agree with the above. Karie Chimera, M.D., Ph.D. 01/15/23 12:14 PM    Abbreviations: M myopia (nearsighted); A astigmatism; H hyperopia (farsighted); P presbyopia; Mrx spectacle prescription;  CTL contact lenses; OD right eye; OS left eye; OU both eyes  XT exotropia; ET esotropia; PEK punctate epithelial keratitis; PEE punctate epithelial erosions; DES dry eye syndrome; MGD meibomian gland dysfunction; ATs artificial tears; PFAT's preservative free artificial tears; NSC nuclear sclerotic cataract; PSC posterior subcapsular cataract; ERM epi-retinal membrane; PVD posterior vitreous detachment; RD retinal detachment; DM diabetes mellitus; DR diabetic retinopathy; NPDR non-proliferative diabetic retinopathy; PDR proliferative diabetic retinopathy; CSME clinically significant macular edema; DME diabetic macular edema; dbh dot blot hemorrhages; CWS cotton wool spot; POAG primary open angle glaucoma; C/D cup-to-disc ratio; HVF humphrey visual field; GVF goldmann visual field; OCT optical coherence tomography; IOP intraocular pressure; BRVO Branch retinal vein occlusion; CRVO central retinal vein occlusion; CRAO central retinal artery occlusion; BRAO branch retinal artery occlusion; RT retinal tear; SB scleral buckle; PPV pars plana vitrectomy; VH Vitreous hemorrhage; PRP panretinal laser photocoagulation; IVK intravitreal kenalog; VMT vitreomacular traction; MH Macular hole;  NVD neovascularization of the disc; NVE neovascularization elsewhere; AREDS age related eye disease study; ARMD age related macular degeneration; POAG primary open angle glaucoma;  EBMD epithelial/anterior basement membrane dystrophy; ACIOL anterior chamber intraocular lens; IOL intraocular lens; PCIOL posterior chamber intraocular lens; Phaco/IOL phacoemulsification with intraocular lens placement; PRK photorefractive keratectomy; LASIK laser assisted in situ keratomileusis; HTN hypertension; DM diabetes mellitus; COPD chronic obstructive pulmonary disease

## 2023-01-12 ENCOUNTER — Other Ambulatory Visit: Payer: Self-pay | Admitting: Cardiology

## 2023-01-15 ENCOUNTER — Ambulatory Visit (INDEPENDENT_AMBULATORY_CARE_PROVIDER_SITE_OTHER): Payer: BC Managed Care – PPO | Admitting: Ophthalmology

## 2023-01-15 ENCOUNTER — Encounter (INDEPENDENT_AMBULATORY_CARE_PROVIDER_SITE_OTHER): Payer: Self-pay | Admitting: Ophthalmology

## 2023-01-15 DIAGNOSIS — Z7984 Long term (current) use of oral hypoglycemic drugs: Secondary | ICD-10-CM

## 2023-01-15 DIAGNOSIS — Z7985 Long-term (current) use of injectable non-insulin antidiabetic drugs: Secondary | ICD-10-CM | POA: Diagnosis not present

## 2023-01-15 DIAGNOSIS — H35033 Hypertensive retinopathy, bilateral: Secondary | ICD-10-CM | POA: Diagnosis not present

## 2023-01-15 DIAGNOSIS — E113593 Type 2 diabetes mellitus with proliferative diabetic retinopathy without macular edema, bilateral: Secondary | ICD-10-CM

## 2023-01-15 DIAGNOSIS — H25813 Combined forms of age-related cataract, bilateral: Secondary | ICD-10-CM

## 2023-01-15 DIAGNOSIS — H3561 Retinal hemorrhage, right eye: Secondary | ICD-10-CM

## 2023-01-15 DIAGNOSIS — I10 Essential (primary) hypertension: Secondary | ICD-10-CM | POA: Diagnosis not present

## 2023-01-15 DIAGNOSIS — H3563 Retinal hemorrhage, bilateral: Secondary | ICD-10-CM

## 2023-01-15 MED ORDER — BEVACIZUMAB CHEMO INJECTION 1.25MG/0.05ML SYRINGE FOR KALEIDOSCOPE
1.2500 mg | INTRAVITREAL | Status: AC | PRN
Start: 2023-01-15 — End: 2023-01-15
  Administered 2023-01-15: 1.25 mg via INTRAVITREAL

## 2023-01-30 NOTE — Progress Notes (Signed)
[facility] - Clinic Note  02/13/2023   CHIEF COMPLAINT Patient presents for Retina Follow Up  HISTORY OF PRESENT ILLNESS: Jill [name] is a 57 y.o. female who presents to the clinic today for:  HPI     Retina Follow Up   Patient presents with  Diabetic Retinopathy.  In both eyes.  This started 4 weeks ago.  I, the attending physician,  performed the HPI with the patient and updated documentation appropriately.        Comments   Patient here for 4 weeks retina follow up for PDR OU. Patient states vision pretty good. No more floaters or anything. No eye pain. Uses no drops.      Last edited by Rennis Chris, MD on 02/13/2023  8:47 PM.     Referring physician: Pecolia Ades, MD [address] [place],  [place] [address]  HISTORICAL INFORMATION:  Selected notes from the MEDICAL RECORD NUMBER Referred by Dr. Ilsa Iha for diabetic retinopathy eval -- new subhyaloid heme OD LEE:  Ocular Hx- PMH-   CURRENT MEDICATIONS: No current outpatient medications on file. (Ophthalmic Drugs)   No current facility-administered medications for this visit. (Ophthalmic Drugs)   Current Outpatient Medications (Other)  Medication Sig   acetaminophen (TYLENOL) 500 MG tablet Take 500-1,000 mg by mouth every 8 (eight) hours as needed for moderate pain or headache.    aspirin EC 81 MG tablet Take 81 mg by mouth every morning.   bismuth subsalicylate (PEPTO BISMOL) 262 MG chewable tablet Chew 524 mg by mouth 3 (three) times daily as needed (stomach pain.).   Calcium-Phosphorus-Vitamin D (CITRACAL +D3 PO) Take 2 tablets by mouth in the morning and at bedtime.   clopidogrel (PLAVIX) 75 MG tablet Take 75 mg by mouth in the morning.   Cyanocobalamin (VITAMIN B-12) 5000 MCG SUBL Place 5,000 mcg under the tongue 3 (three) times a week.   docusate sodium (COLACE) 100 MG capsule Take 100 mg by mouth in the morning.   felodipine (PLENDIL) 5 MG 24 hr tablet Take 5 mg by mouth in the  morning.   fluconazole (DIFLUCAN) 150 MG tablet Take 1 tablet (150 mg total) by mouth daily. If symptoms not improved after 1 dose, can repeat again in 3 days.   furosemide (LASIX) 20 MG tablet Take 1 tablet (20 mg total) by mouth daily. May take an extra tablet as needed for swelling   gabapentin (NEURONTIN) 300 MG capsule Take 300 mg by mouth 2 (two) times daily.   lamoTRIgine (LAMICTAL) 100 MG tablet Take 100 mg by mouth every morning.    levETIRAcetam (KEPPRA) 500 MG tablet Take 250 mg by mouth 2 (two) times daily.   linaclotide (LINZESS) 145 MCG CAPS capsule Take 145 mcg by mouth daily before breakfast.   LORazepam (ATIVAN) 0.5 MG tablet Take 0.5 mg by mouth daily as needed for anxiety.   metFORMIN (GLUCOPHAGE) 500 MG tablet Take 500 mg by mouth 2 (two) times daily.   metoprolol succinate (TOPROL-XL) 25 MG 24 hr tablet Take 1 tablet by mouth daily   Multiple Vitamins-Minerals (BARIATRIC MULTIVITAMINS/IRON PO) Take 1 tablet by mouth every evening.   nicotine (NICODERM CQ - DOSED IN MG/24 HOURS) 21 mg/24hr patch Place 1 patch (21 mg total) onto the skin daily.   olmesartan (BENICAR) 40 MG tablet Take 40 mg by mouth every morning.    OZEMPIC, 1 MG/DOSE, 2 MG/1.5ML SOPN Inject 1 mg into the skin every 30 (thirty) days.   pantoprazole (  PROTONIX) 40 MG tablet Take 1 tablet (40 mg total) by mouth 2 (two) times daily.   pioglitazone (ACTOS) 30 MG tablet Take 30 mg by mouth every morning.   potassium chloride (KLOR-CON M) 10 MEQ tablet TAKE 2 TABLETS BY MOUTH DAILY.   rosuvastatin (CRESTOR) 40 MG tablet Take 1 tablet (40 mg total) by mouth at bedtime.   No current facility-administered medications for this visit. (Other)   REVIEW OF SYSTEMS: ROS   Positive for: Gastrointestinal, Neurological, Endocrine, Cardiovascular, Eyes Negative for: Constitutional, Skin, Genitourinary, Musculoskeletal, HENT, Respiratory, Psychiatric, Allergic/Imm, Heme/Lymph Last edited by Laddie Aquas, COA on  02/13/2023  9:42 AM.      ALLERGIES Allergies  Allergen Reactions   Dilaudid [Hydromorphone Hcl] Nausea And Vomiting   Crab Extract Swelling    Crab Cakes   Crab [Shellfish Allergy] Swelling   Hydrocodone    Hydromorphone     Other Reaction(s): GI Intolerance   Other Other (See Comments)    "pt is a difficult IV stick, prefers IV team paged for IV starts"   PAST MEDICAL HISTORY Past Medical History:  Diagnosis Date   Anemia    Anxiety    Back pain    complains when laying on hard flat surface   Chronic kidney disease    appt. /w urology 11/10/2014- for a cyst seen on [place]. evaluated was told it was nothing.   Diabetes mellitus    Diabetes II, oral and insulin.   DVT (deep venous thrombosis) (HCC)    right leg '09 or '10   Fibroids    GERD (gastroesophageal reflux disease)    Heart murmur    History of kidney stones    History of stress test    referred for cardiac cath- done here at [facility]- 09/2014   Hypercholesterolemia    Hypertension    Neuromuscular disorder (HCC)    neuropathy   PONV (postoperative nausea and vomiting)    Seizures (HCC)    02/01/23- last seizure approximately 2015;   Sleep apnea    no longer uses a cpap   Stroke [facility])    '09-had blockage in brain, too deep to operate"occ left lower extremity fatique, occ. 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   Past Surgical History:  Procedure Laterality Date   APPENDECTOMY     BIOPSY  11/18/2021   Procedure: BIOPSY;  Surgeon: Dolores Frame, MD;  [facility];  Service: Gastroenterology;;  random colon;   BIOPSY  12/27/2021   Procedure: BIOPSY;  Surgeon: Dolores Frame, MD;  [facility];  Service: Gastroenterology;;   CARDIAC CATHETERIZATION     CESAREAN SECTION  1986 & 1988   x2   CHOLECYSTECTOMY     open"gallstones"   COLONOSCOPY WITH PROPOFOL N/A 11/18/2021   Procedure:  COLONOSCOPY WITH PROPOFOL;  Surgeon: Dolores Frame, MD;  [facility];  Service: Gastroenterology;  Laterality: N/A;  1215   DILATATION & CURETTAGE/HYSTEROSCOPY WITH MYOSURE N/A 05/10/2017   Procedure: DILATATION & CURETTAGE/HYSTEROSCOPY WITH MYOSURE;  Surgeon: Maxie Better, MD;  Location: [facility];  Service: Gynecology;  Laterality: N/A;   ESOPHAGOGASTRODUODENOSCOPY (EGD) WITH PROPOFOL N/A 12/27/2021   Procedure: ESOPHAGOGASTRODUODENOSCOPY (EGD) WITH PROPOFOL;  Surgeon: Dolores Frame, MD;  [facility];  Service: Gastroenterology;  Laterality: N/A;  100   ESOPHAGOGASTRODUODENOSCOPY (EGD) WITH PROPOFOL N/A 01/13/2022   Procedure: ESOPHAGOGASTRODUODENOSCOPY (EGD) WITH  PROPOFOL;  Surgeon: Marguerita Merles, Reuel Boom, MD;  [facility]: [facility];  Service: Gastroenterology;  Laterality: N/A;  235, per Soledad Gerlach pt knows to arrive at 6:15   HEMOSTASIS CLIP PLACEMENT  01/13/2022   Procedure: HEMOSTASIS CLIP PLACEMENT;  Surgeon: Dolores Frame, MD;  [facility]: [facility];  Service: Gastroenterology;;   INTERVENTIONAL RADIOLOGY PROCEDURE     CT angiography /neck -Dr. Corliss Skains.   IR ANGIO INTRA EXTRACRAN SEL COM CAROTID INNOMINATE BILAT MOD SED  02/22/2018   IR ANGIO INTRA EXTRACRAN SEL COM CAROTID INNOMINATE BILAT MOD SED  03/08/2022   IR ANGIO VERTEBRAL SEL VERTEBRAL BILAT MOD SED  02/22/2018   IR ANGIO VERTEBRAL SEL VERTEBRAL UNI R MOD SED  03/08/2022   IR GENERIC HISTORICAL  08/03/2016   IR RADIOLOGIST EVAL & MGMT 08/03/2016 [facility]   IR RADIOLOGIST EVAL & MGMT  01/03/2022   LAPAROSCOPIC GASTRIC SLEEVE RESECTION N/A 04/10/2016   Procedure: LAPAROSCOPIC GASTRIC SLEEVE RESECTION WITH UPPER ENDO;  Surgeon: Gaynelle Adu, MD;  Location: [facility];  Service: General;  Laterality: N/A;   LEFT HEART CATHETERIZATION WITH CORONARY ANGIOGRAM N/A 09/28/2014   Procedure: LEFT HEART CATHETERIZATION WITH CORONARY ANGIOGRAM;  Surgeon: Corky Crafts, MD;   Location: [facility] CATH LAB;  Service: Cardiovascular;  Laterality: N/A;   NEPHROLITHOTOMY Right 02/01/2022   Procedure: RIGHT NEPHROLITHOTOMY PERCUTANEOUS WITH SURGEON ACCESS;  Surgeon: Crist Fat, MD;  Location: [facility];  Service: Urology;  Laterality: Right;   NEPHROLITHOTOMY Left 02/15/2022   Procedure: LEFT NEPHROLITHOTOMY PERCUTANEOUS WITH SURGEON ACCESS, REMOVAL OF RIGHT URETERAL STENT;  Surgeon: Crist Fat, MD;  Location: [facility];  Service: Urology;  Laterality: Left;  195 MINUTES   POLYPECTOMY  01/13/2022   Procedure: POLYPECTOMY;  Surgeon: Dolores Frame, MD;  [facility];  Service: Gastroenterology;;   RADIOLOGY WITH ANESTHESIA N/A 10/14/2014   Procedure: ANGIOPLASTY;  Surgeon: Julieanne Cotton, MD;  Location: [facility] OR;  Service: Radiology;  Laterality: N/A;   TUBAL LIGATION     FAMILY HISTORY Family History  Problem Relation Age of Onset   Cataracts Mother    Kidney disease Mother        dialysis for 26 years   Heart attack Mother        17's   Diabetes Father    Cataracts Father    Heart failure Father    Heart disease Father    Heart attack Father        9's   Stroke Paternal Grandmother    SOCIAL HISTORY Social History   Tobacco Use   Smoking status: Every Day    Current packs/day: 0.25    Average packs/day: 0.3 packs/day for 47.6 years (11.9 ttl pk-yrs)    Types: Cigarettes    Start date: 07/25/1975    Passive exposure: Current   Smokeless tobacco: Never  Vaping Use   Vaping status: Never Used  Substance Use Topics   Alcohol use: Never   Drug use: No       OPHTHALMIC EXAM:  Base Eye Exam     Visual Acuity (Snellen - Linear)       Right Left   Dist cc 20/30 -1 20/25   Dist ph cc 20/25 NI    Correction: Glasses         Tonometry (Tonopen, 9:40 AM)       Right Left   Pressure 11 12         Pupils       Dark  Light Shape React APD   Right 2 1 Round Brisk None   Left 2 1 Round Brisk None         Visual  Fields (Counting fingers)       Left Right    Full Full         Extraocular Movement       Right Left    Full, Ortho Full, Ortho         Neuro/Psych     Oriented x3: Yes   Mood/Affect: Normal         Dilation     Both eyes: 1.0% Mydriacyl, 2.5% Phenylephrine @ 9:40 AM           Slit Lamp and Fundus Exam     Slit Lamp Exam       Right Left   Lids/Lashes Dermatochalasis - upper lid Dermatochalasis - upper lid   Conjunctiva/Sclera mild melanosis mild melanosis   Cornea tear film debris tear film debris   Anterior Chamber deep and clear deep and clear   Iris Round and dilated, No NVI Round and dilated, No NVI   Lens 2+ Nuclear sclerosis, 2-3+ Cortical cataract 2+ Nuclear sclerosis, 2-3+ Cortical cataract, 2+ Posterior subcapsular cataract   Anterior Vitreous Vitreous syneresis, interval improvement in vitreous and subhyaloid heme, trace VH remnants settled inferiorly -- white in color Vitreous syneresis         Fundus Exam       Right Left   Disc Pink and Sharp, no NVD, +fibrosis Pink and Sharp, +cupping   C/D Ratio 0.5 0.65   Macula Flat, Good foveal reflex, RPE mottling and clumping, No heme or edema Flat, Good foveal reflex, RPE mottling and clumping, scattered MA/DBH, no frank edema   Vessels attenuated, Tortuous, early patches of NVE inferior midzone attenuated, mild tortuosity, early midzonal NVE   Periphery Attached, boat shaped subhyaloid heme inferior midzone increased and more diffuse Attached, scattered MA, white now boat shaped subhayloid / pre retinal heme inferior to disc, good PRP changes nasal hemisphere           Refraction     Wearing Rx       Sphere Cylinder Axis   Right -2.00 +0.50 178   Left -2.00 +0.75 014           IMAGING AND PROCEDURES  Imaging and Procedures for 02/13/2023  OCT, Retina - OU - Both Eyes       Right Eye Quality was good. Central Foveal Thickness: 242. Progression has improved. Findings include  normal foveal contour, no IRF, no SRF (Partial PVD with mild interval improvement in vitreous opacities).   Left Eye Quality was good. Central Foveal Thickness: 220. Progression has been stable. Findings include normal foveal contour, no IRF, no SRF, vitreomacular adhesion (No DME, Mild diffuse atrophy, settling of pre-retinal hyper reflective material inferior to disc-- seen best on widefield, persistent vitreous / subhyaloid opacities).   Notes *Images captured and stored on drive  Diagnosis / Impression:  No DME OU OD: Partial PVD with significant interval improvement in subhyaloid hyper reflective material / subhyaloid heme OS: No DME, Mild diffuse atrophy, settling of pre-retinal hyper reflective material inferior to disc-- seen best on widefield, persistent vitreous / subhyaloid opacities  Clinical management:  See below  Abbreviations: NFP - Normal foveal profile. CME - cystoid macular edema. PED - pigment epithelial detachment. IRF - intraretinal fluid. SRF - subretinal fluid. EZ - ellipsoid zone. ERM - epiretinal membrane. ORA -  outer retinal atrophy. ORT - outer retinal tubulation. SRHM - subretinal hyper-reflective material. IRHM - intraretinal hyper-reflective material      Intravitreal Injection, Pharmacologic Agent - OD - Right Eye       Time Out 02/13/2023. 10:51 AM. Confirmed correct patient, procedure, site, and patient consented.   Anesthesia Topical anesthesia was used. Anesthetic medications included Lidocaine 2%, Proparacaine 0.5%.   Procedure Preparation included 5% betadine to ocular surface, eyelid speculum. A (32g) needle was used.   Injection: 1.25 mg Bevacizumab 1.25mg /0.28ml   Route: Intravitreal, Site: Right Eye   NDC: P3213405, Lot: [id number], Expiration date: 04/30/2023   Post-op Post injection exam found visual acuity of at least counting fingers. The patient tolerated the procedure well. There were no complications. The patient received written  and verbal post procedure care education.      Intravitreal Injection, Pharmacologic Agent - OS - Left Eye       Time Out 02/13/2023. 10:51 AM. Confirmed correct patient, procedure, site, and patient consented.   Anesthesia Topical anesthesia was used. Anesthetic medications included Lidocaine 2%, Proparacaine 0.5%.   Procedure Preparation included 5% betadine to ocular surface, eyelid speculum. A supplied needle was used.   Injection: 1.25 mg Bevacizumab 1.25mg /0.37ml   Route: Intravitreal, Site: Left Eye   NDC: P3213405, Lot: [id number], Expiration date: 03/04/2023   Post-op Post injection exam found visual acuity of at least counting fingers. The patient tolerated the procedure well. There were no complications. The patient received written and verbal post procedure care education.      Fluorescein Angiography Optos (Transit OD)       Right Eye Progression has improved. Early phase findings include blockage, leakage, microaneurysm, retinal neovascularization, vascular perfusion defect (Delayed filling, low fluorescein signal, focal NVE and vascular non-perfusion IN midzone -- improved). Mid/Late phase findings include blockage, leakage, microaneurysm, retinal neovascularization, vascular perfusion defect (low fluorescein signal, focal NVE and vascular non-perfusion IN midzone -- improved).   Left Eye Progression has improved. Early phase findings include blockage, staining, microaneurysm, retinal neovascularization (Low fluorescein signal, new boat shaped heme inferior to disc ). Mid/Late phase findings include blockage, leakage, staining, microaneurysm, retinal neovascularization, vascular perfusion defect (Low fluorescein signal, punctate NVE greatest inferior midzone -- improved, new boat shaped heme inferior to disc).   Notes **Images stored on drive**  Impression: PDR OU (OD>OS) OD: low fluorescein signal, focal NVE and vascular non-perfusion IN midzone -- improved OS:  Low fluorescein signal, punctate NVE greatest inferior midzone -- improved, new boat shaped heme inferior to disc            ASSESSMENT/PLAN:   ICD-10-CM   1. 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 Subhyaloid hemorrhage of both eyes  H35.63     3. Long term (current) use of oral hypoglycemic drugs  Z79.84     4. Long-term (current) use of injectable non-insulin antidiabetic drugs  Z79.85     5. Essential hypertension  I10     6. Hypertensive retinopathy of both eyes  H35.033 Fluorescein Angiography Optos (Transit OD)    7. Combined forms of age-related cataract of both eyes  H25.813  1-4. Proliferative diabetic retinopathy w/o DME, OU  - s/p IVA OD #1 (05.23.24), #2 (06.24.24)  - s/p IVA OS #1 (06.24.24)  - s/p PRP OS (06.03.24) -- incomplete / aborted due to history of photogenic seizures  - OD with subhyaloid heme -- improved; OS with persistent subhyaloid heme today - FA (05.23.24) shows OD: Boat shaped blockage inferior midzone from subhyaloid heme, focal NVE and vascular non-perfusion IN midzone; OS: Scattered punctate NVE greatest inferior midzone -- pt needs PRP OU - repeat FA (07.23.24): OD: low fluorescein signal, focal NVE and vascular non-perfusion IN midzone -- improved; OS: Low fluorescein signal, punctate NVE greatest inferior midzone -- improved, new boat shaped heme inferior to disc - OCT shows OD: Partial PVD with significant interval improvement in subhyaloid hyper reflective material / subhyaloid heme; OS:  No DME, Mild diffuse atrophy, settling of pre-retinal hyper reflective material inferior to disc-- seen best on widefield, persistent  vitreous / subhyaloid opacities - recommend IVA OU (OD #3 and OS #2) today, 06.24.24 for PDR and subhyaloid hemes - pt wishes to proceed  - RBA of procedure discussed, questions answered - informed consent obtained and signed - see procedure note - discussed benefit of PRP laser OU and concern with history of photogenic seizures -- may need to have laser in OR under general anesthesia - working plan is to stabilize hemorrhages with anti-VEGF therapy and then potentially take pt to OR for PRP OU - f/u 4 weeks -- DFE/OCT  5,6. Hypertensive retinopathy OU - discussed importance of tight BP control - monitor  7. Mixed Cataract OU - The symptoms of cataract, surgical options, and treatments and risks were discussed with patient. - discussed diagnosis and progression - monitor  8. Hx of photogenic seizures  Ophthalmic Meds Ordered this visit:  Meds ordered this encounter  Medications   Bevacizumab (AVASTIN) SOLN 1.25 mg   Bevacizumab (AVASTIN) SOLN 1.25 mg     Return in about 4 weeks (around 03/13/2023) for f/u PDR OU, DFE, OCT.  There are no Patient Instructions on file for this visit.  Explained the diagnoses, plan, and follow up with the patient and they expressed understanding.  Patient expressed understanding of the importance of proper follow up care.   This document serves as a record of services personally performed by Karie Chimera, MD, PhD. It was created on their behalf by Gerilyn Nestle, COT an ophthalmic technician. The creation of this record is the provider's dictation and/or activities during the visit.    Electronically signed by:  Charlette Caffey, COT  02/13/23 8:47 PM  This document serves as a record of services personally performed by Karie Chimera, MD, PhD. It was created on their behalf by Glee Arvin. Manson Passey, OA an ophthalmic technician. The creation of this record is the provider's dictation and/or activities during the visit.    Electronically signed  by: Glee Arvin. Manson Passey, OA 02/13/23 8:47 PM  Karie Chimera, M.D., Ph.D. Diseases & Surgery of the Retina and Vitreous [facility]  I have reviewed the above documentation for accuracy and completeness, and I agree with the above. Karie Chimera, M.D., Ph.D. 02/13/23 8:51 PM   Abbreviations: M myopia (nearsighted); A astigmatism; H hyperopia (farsighted); P presbyopia; Mrx spectacle prescription;  CTL contact lenses; OD right eye; OS left eye; OU both eyes  XT exotropia; ET esotropia; PEK punctate epithelial keratitis; PEE punctate epithelial erosions; DES dry eye syndrome; MGD meibomian gland dysfunction; ATs artificial tears;  PFAT's preservative free artificial tears; NSC nuclear sclerotic cataract; PSC posterior subcapsular cataract; ERM epi-retinal membrane; PVD posterior vitreous detachment; RD retinal detachment; DM diabetes mellitus; DR diabetic retinopathy; NPDR non-proliferative diabetic retinopathy; PDR proliferative diabetic retinopathy; CSME clinically significant macular edema; DME diabetic macular edema; dbh dot blot hemorrhages; CWS cotton wool spot; POAG primary open angle glaucoma; C/D cup-to-disc ratio; HVF humphrey visual field; GVF goldmann visual field; OCT optical coherence tomography; IOP intraocular pressure; BRVO Branch retinal vein occlusion; CRVO central retinal vein occlusion; CRAO central retinal artery occlusion; BRAO branch retinal artery occlusion; RT retinal tear; SB scleral buckle; PPV pars plana vitrectomy; VH Vitreous hemorrhage; PRP panretinal laser photocoagulation; IVK intravitreal kenalog; VMT vitreomacular traction; MH Macular hole;  NVD neovascularization of the disc; NVE neovascularization elsewhere; AREDS age related eye disease study; ARMD age related macular degeneration; POAG primary open angle glaucoma; EBMD epithelial/anterior basement membrane dystrophy; ACIOL anterior chamber intraocular lens; IOL intraocular lens; PCIOL posterior  chamber intraocular lens; Phaco/IOL phacoemulsification with intraocular lens placement; PRK photorefractive keratectomy; LASIK laser assisted in situ keratomileusis; HTN hypertension; DM diabetes mellitus; COPD chronic obstructive pulmonary disease

## 2023-02-01 ENCOUNTER — Encounter (HOSPITAL_COMMUNITY): Payer: Self-pay

## 2023-02-01 ENCOUNTER — Other Ambulatory Visit: Payer: Self-pay

## 2023-02-01 ENCOUNTER — Encounter (HOSPITAL_BASED_OUTPATIENT_CLINIC_OR_DEPARTMENT_OTHER): Payer: Self-pay | Admitting: Surgery

## 2023-02-01 NOTE — Progress Notes (Addendum)
Spoke w/ via phone for pre-op interview: patient  Lab needs dos: I-Stat Lab results: EKG 10/20/22; Echo 09/12/22- EF 65-70% COVID test: patient states asymptomatic no test needed. Arrive at 0530 02/23/23. NPO after MN except clear liquids. Clear liquids from MN until 0430 Med rec completed.  Medications to take morning of surgery: plendil, gabapentin, lamotrigine, levetiracetam, metoprolol, and pantoprazole. Pt not to hold aspirin per Dr. Corliss Skains, prescribing provider; hold nicotene patch 24 hours prior to sx; hold vitamins, supplements, and ozempic 1 week prior to sx; Pt states that Dr. Corliss Skains instructed her to hold Plavix 7 days prior to sx.   Diabetic medication: none AM of sx Patient instructed no nail polish to be worn day of surgery. Patient instructed to bring photo id and insurance card day of surgery. Patient aware to have driver (ride ) / caregiver for 24 hours after surgery. Husband or son to drive. Patient verbalized understanding of instructions that were given at this phone interview. Patient denies shortness of breath, chest pain, fever, cough at this phone interview.  Pt states last seizure around 2015.   *Pt states that Dr. Corliss Skains (prescribing provider) instructed her to hold Plavix 7 days prior to sx. There is a note in Epic stating that Dr. Bedelia Person said pt may hold Plavix, but it does not clarify for how long.Spoke with Toniann Fail, nurse at [facility], to request orders from prescribing provider clarifying how long patient can hold Plavix prior to sx.  Cardiac clearance 01/02/23- see note. Cardiologist is Dr. Wyline Mood.

## 2023-02-05 ENCOUNTER — Telehealth: Payer: Self-pay | Admitting: Student

## 2023-02-05 ENCOUNTER — Ambulatory Visit: Payer: Self-pay | Admitting: Surgery

## 2023-02-05 NOTE — Telephone Encounter (Signed)
Patient can have Plavix held for the requested 7 days by Dr Cliffton Asters. Dr Corliss Skains would like for patient to resume Plavix as soon as her surgery is done. Patient must remain taking ASA 81 mg daily.

## 2023-02-06 DIAGNOSIS — H5213 Myopia, bilateral: Secondary | ICD-10-CM | POA: Diagnosis not present

## 2023-02-06 NOTE — Progress Notes (Addendum)
Jill [name] from [facility] called and left message: note for plavix clearance in epic.  Note in epic from dr Jill [name] to hold plivix x 7 days, note placed on chart, Spoke with patient by phone and patient aware to hold plavix x 7 days and to stay on 81 mg asa for 02-23-2023 surgery per dr Susa Day instructions.

## 2023-02-07 ENCOUNTER — Ambulatory Visit: Payer: BC Managed Care – PPO | Admitting: Pulmonary Disease

## 2023-02-08 ENCOUNTER — Ambulatory Visit: Payer: BC Managed Care – PPO | Admitting: Pulmonary Disease

## 2023-02-13 ENCOUNTER — Encounter (INDEPENDENT_AMBULATORY_CARE_PROVIDER_SITE_OTHER): Payer: Self-pay | Admitting: Ophthalmology

## 2023-02-13 ENCOUNTER — Ambulatory Visit (INDEPENDENT_AMBULATORY_CARE_PROVIDER_SITE_OTHER): Payer: BC Managed Care – PPO | Admitting: Ophthalmology

## 2023-02-13 DIAGNOSIS — H35033 Hypertensive retinopathy, bilateral: Secondary | ICD-10-CM | POA: Diagnosis not present

## 2023-02-13 DIAGNOSIS — H3563 Retinal hemorrhage, bilateral: Secondary | ICD-10-CM

## 2023-02-13 DIAGNOSIS — E113593 Type 2 diabetes mellitus with proliferative diabetic retinopathy without macular edema, bilateral: Secondary | ICD-10-CM

## 2023-02-13 DIAGNOSIS — I10 Essential (primary) hypertension: Secondary | ICD-10-CM | POA: Diagnosis not present

## 2023-02-13 DIAGNOSIS — Z7984 Long term (current) use of oral hypoglycemic drugs: Secondary | ICD-10-CM

## 2023-02-13 DIAGNOSIS — H25813 Combined forms of age-related cataract, bilateral: Secondary | ICD-10-CM

## 2023-02-13 DIAGNOSIS — Z7985 Long-term (current) use of injectable non-insulin antidiabetic drugs: Secondary | ICD-10-CM

## 2023-02-13 MED ORDER — BEVACIZUMAB CHEMO INJECTION 1.25MG/0.05ML SYRINGE FOR KALEIDOSCOPE
1.2500 mg | INTRAVITREAL | Status: AC | PRN
Start: 2023-02-13 — End: 2023-02-13
  Administered 2023-02-13: 1.25 mg via INTRAVITREAL

## 2023-02-20 DIAGNOSIS — I10 Essential (primary) hypertension: Secondary | ICD-10-CM | POA: Diagnosis not present

## 2023-02-20 DIAGNOSIS — I2510 Atherosclerotic heart disease of native coronary artery without angina pectoris: Secondary | ICD-10-CM | POA: Diagnosis not present

## 2023-02-20 DIAGNOSIS — I739 Peripheral vascular disease, unspecified: Secondary | ICD-10-CM | POA: Diagnosis not present

## 2023-02-20 DIAGNOSIS — E7800 Pure hypercholesterolemia, unspecified: Secondary | ICD-10-CM | POA: Diagnosis not present

## 2023-02-20 DIAGNOSIS — E663 Overweight: Secondary | ICD-10-CM | POA: Diagnosis not present

## 2023-02-20 DIAGNOSIS — K604 Rectal fistula: Secondary | ICD-10-CM | POA: Diagnosis not present

## 2023-02-20 DIAGNOSIS — G4733 Obstructive sleep apnea (adult) (pediatric): Secondary | ICD-10-CM | POA: Diagnosis not present

## 2023-02-20 DIAGNOSIS — E1169 Type 2 diabetes mellitus with other specified complication: Secondary | ICD-10-CM | POA: Diagnosis not present

## 2023-02-23 ENCOUNTER — Ambulatory Visit (HOSPITAL_BASED_OUTPATIENT_CLINIC_OR_DEPARTMENT_OTHER): Payer: BC Managed Care – PPO | Admitting: Anesthesiology

## 2023-02-23 ENCOUNTER — Other Ambulatory Visit: Payer: Self-pay

## 2023-02-23 ENCOUNTER — Ambulatory Visit (HOSPITAL_BASED_OUTPATIENT_CLINIC_OR_DEPARTMENT_OTHER)
Admission: RE | Admit: 2023-02-23 | Discharge: 2023-02-23 | Disposition: A | Discharge status: Home / Self Care | Payer: BC Managed Care – PPO | Source: Home / Self Care | Attending: Surgery | Admitting: Surgery

## 2023-02-23 ENCOUNTER — Encounter (HOSPITAL_BASED_OUTPATIENT_CLINIC_OR_DEPARTMENT_OTHER)
Admission: RE | Disposition: A | Discharge status: Home / Self Care | Payer: Self-pay | Source: Home / Self Care | Attending: Surgery

## 2023-02-23 ENCOUNTER — Encounter (HOSPITAL_BASED_OUTPATIENT_CLINIC_OR_DEPARTMENT_OTHER): Payer: Self-pay | Admitting: Surgery

## 2023-02-23 DIAGNOSIS — Z8673 Personal history of transient ischemic attack (TIA), and cerebral infarction without residual deficits: Secondary | ICD-10-CM | POA: Insufficient documentation

## 2023-02-23 DIAGNOSIS — Z7985 Long-term (current) use of injectable non-insulin antidiabetic drugs: Secondary | ICD-10-CM | POA: Insufficient documentation

## 2023-02-23 DIAGNOSIS — G4730 Sleep apnea, unspecified: Secondary | ICD-10-CM | POA: Diagnosis not present

## 2023-02-23 DIAGNOSIS — E119 Type 2 diabetes mellitus without complications: Secondary | ICD-10-CM

## 2023-02-23 DIAGNOSIS — F17210 Nicotine dependence, cigarettes, uncomplicated: Secondary | ICD-10-CM | POA: Diagnosis not present

## 2023-02-23 DIAGNOSIS — E785 Hyperlipidemia, unspecified: Secondary | ICD-10-CM | POA: Diagnosis not present

## 2023-02-23 DIAGNOSIS — Z7982 Long term (current) use of aspirin: Secondary | ICD-10-CM | POA: Diagnosis not present

## 2023-02-23 DIAGNOSIS — K603 Anal fistula: Secondary | ICD-10-CM | POA: Diagnosis not present

## 2023-02-23 DIAGNOSIS — I129 Hypertensive chronic kidney disease with stage 1 through stage 4 chronic kidney disease, or unspecified chronic kidney disease: Secondary | ICD-10-CM | POA: Diagnosis not present

## 2023-02-23 DIAGNOSIS — Z7984 Long term (current) use of oral hypoglycemic drugs: Secondary | ICD-10-CM | POA: Diagnosis not present

## 2023-02-23 DIAGNOSIS — K610 Anal abscess: Secondary | ICD-10-CM | POA: Diagnosis not present

## 2023-02-23 DIAGNOSIS — K219 Gastro-esophageal reflux disease without esophagitis: Secondary | ICD-10-CM | POA: Insufficient documentation

## 2023-02-23 DIAGNOSIS — Z7902 Long term (current) use of antithrombotics/antiplatelets: Secondary | ICD-10-CM | POA: Diagnosis not present

## 2023-02-23 DIAGNOSIS — E1122 Type 2 diabetes mellitus with diabetic chronic kidney disease: Secondary | ICD-10-CM | POA: Insufficient documentation

## 2023-02-23 DIAGNOSIS — Z79899 Other long term (current) drug therapy: Secondary | ICD-10-CM | POA: Insufficient documentation

## 2023-02-23 DIAGNOSIS — G40909 Epilepsy, unspecified, not intractable, without status epilepticus: Secondary | ICD-10-CM | POA: Diagnosis not present

## 2023-02-23 DIAGNOSIS — Z794 Long term (current) use of insulin: Secondary | ICD-10-CM | POA: Diagnosis not present

## 2023-02-23 DIAGNOSIS — Z86718 Personal history of other venous thrombosis and embolism: Secondary | ICD-10-CM | POA: Insufficient documentation

## 2023-02-23 DIAGNOSIS — I10 Essential (primary) hypertension: Secondary | ICD-10-CM

## 2023-02-23 DIAGNOSIS — Z01818 Encounter for other preprocedural examination: Secondary | ICD-10-CM

## 2023-02-23 DIAGNOSIS — N189 Chronic kidney disease, unspecified: Secondary | ICD-10-CM | POA: Insufficient documentation

## 2023-02-23 DIAGNOSIS — Z9884 Bariatric surgery status: Secondary | ICD-10-CM | POA: Insufficient documentation

## 2023-02-23 HISTORY — PX: INCISION AND DRAINAGE ABSCESS: SHX5864

## 2023-02-23 HISTORY — PX: PLACEMENT OF SETON: SHX6029

## 2023-02-23 LAB — POCT I-STAT, CHEM 8
BUN: 21 mg/dL — ABNORMAL HIGH (ref 6–20)
Calcium, Ion: 1.31 mmol/L (ref 1.15–1.40)
Chloride: 108 mmol/L (ref 98–111)
Creatinine, Ser: 1.1 mg/dL — ABNORMAL HIGH (ref 0.44–1.00)
Glucose, Bld: 107 mg/dL — ABNORMAL HIGH (ref 70–99)
HCT: 38 % (ref 36.0–46.0)
Hemoglobin: 12.9 g/dL (ref 12.0–15.0)
Potassium: 4.1 mmol/L (ref 3.5–5.1)
Sodium: 141 mmol/L (ref 135–145)
TCO2: 23 mmol/L (ref 22–32)

## 2023-02-23 LAB — GLUCOSE, CAPILLARY: Glucose-Capillary: 121 mg/dL — ABNORMAL HIGH (ref 70–99)

## 2023-02-23 SURGERY — EXAM UNDER ANESTHESIA
Anesthesia: General | Site: Rectum

## 2023-02-23 MED ORDER — LIDOCAINE HCL (PF) 2 % IJ SOLN
INTRAMUSCULAR | Status: AC
  Filled 2023-02-23: qty 5

## 2023-02-23 MED ORDER — MEPERIDINE HCL 25 MG/ML IJ SOLN: 6.2500 mg | INTRAMUSCULAR | Status: DC | PRN

## 2023-02-23 MED ORDER — AMISULPRIDE (ANTIEMETIC) 5 MG/2ML IV SOLN: 10.0000 mg | Freq: Once | INTRAVENOUS | Status: DC | PRN

## 2023-02-23 MED ORDER — BUPIVACAINE LIPOSOME 1.3 % IJ SUSP: 20.0000 mL | Freq: Once | INTRAMUSCULAR | Status: DC

## 2023-02-23 MED ORDER — OXYCODONE HCL 5 MG/5ML PO SOLN: 5.0000 mg | Freq: Once | ORAL | Status: DC | PRN

## 2023-02-23 MED ORDER — OXYCODONE HCL 5 MG PO TABS: 5.0000 mg | ORAL_TABLET | Freq: Once | ORAL | Status: DC | PRN

## 2023-02-23 MED ORDER — EPHEDRINE 5 MG/ML INJ
INTRAVENOUS | Status: AC
  Filled 2023-02-23: qty 5

## 2023-02-23 MED ORDER — SUGAMMADEX SODIUM 200 MG/2ML IV SOLN
INTRAVENOUS | Status: DC | PRN
  Administered 2023-02-23: 200 mg via INTRAVENOUS

## 2023-02-23 MED ORDER — ROCURONIUM BROMIDE 10 MG/ML (PF) SYRINGE
PREFILLED_SYRINGE | INTRAVENOUS | Status: DC | PRN
  Administered 2023-02-23: 100 mg via INTRAVENOUS

## 2023-02-23 MED ORDER — 0.9 % SODIUM CHLORIDE (POUR BTL) OPTIME
TOPICAL | Status: DC | PRN
  Administered 2023-02-23: 500 mL

## 2023-02-23 MED ORDER — PROPOFOL 10 MG/ML IV BOLUS
INTRAVENOUS | Status: DC | PRN
Start: 2023-02-23 — End: 2023-02-23
  Administered 2023-02-23: 150 mg via INTRAVENOUS

## 2023-02-23 MED ORDER — BUPIVACAINE-EPINEPHRINE (PF) 0.25% -1:200000 IJ SOLN
INTRAMUSCULAR | Status: DC | PRN
  Administered 2023-02-23: 35 mL via SURGICAL_CAVITY

## 2023-02-23 MED ORDER — LACTATED RINGERS IV SOLN: INTRAVENOUS | Status: DC

## 2023-02-23 MED ORDER — LIDOCAINE 2% (20 MG/ML) 5 ML SYRINGE
INTRAMUSCULAR | Status: DC | PRN
  Administered 2023-02-23: 100 mg via INTRAVENOUS

## 2023-02-23 MED ORDER — ONDANSETRON HCL 4 MG/2ML IJ SOLN
INTRAMUSCULAR | Status: AC
  Filled 2023-02-23: qty 2

## 2023-02-23 MED ORDER — MIDAZOLAM HCL 2 MG/2ML IJ SOLN
INTRAMUSCULAR | Status: DC | PRN
  Administered 2023-02-23: 2 mg via INTRAVENOUS

## 2023-02-23 MED ORDER — ACETAMINOPHEN 500 MG PO TABS
1000.0000 mg | ORAL_TABLET | ORAL | Status: AC
  Administered 2023-02-23: 1000 mg via ORAL

## 2023-02-23 MED ORDER — ROCURONIUM BROMIDE 10 MG/ML (PF) SYRINGE
PREFILLED_SYRINGE | INTRAVENOUS | Status: AC
  Filled 2023-02-23: qty 10

## 2023-02-23 MED ORDER — PROPOFOL 10 MG/ML IV BOLUS
INTRAVENOUS | Status: AC
  Filled 2023-02-23: qty 20

## 2023-02-23 MED ORDER — ACETAMINOPHEN 500 MG PO TABS
ORAL_TABLET | ORAL | Status: AC
  Filled 2023-02-23: qty 2

## 2023-02-23 MED ORDER — FENTANYL CITRATE (PF) 100 MCG/2ML IJ SOLN
INTRAMUSCULAR | Status: AC
  Filled 2023-02-23: qty 2

## 2023-02-23 MED ORDER — FLEET ENEMA 7-19 GM/118ML RE ENEM: 1.0000 | ENEMA | Freq: Once | RECTAL | Status: DC

## 2023-02-23 MED ORDER — FENTANYL CITRATE (PF) 100 MCG/2ML IJ SOLN
INTRAMUSCULAR | Status: DC | PRN
  Administered 2023-02-23 (×2): 50 ug via INTRAVENOUS

## 2023-02-23 MED ORDER — MIDAZOLAM HCL 2 MG/2ML IJ SOLN
INTRAMUSCULAR | Status: AC
  Filled 2023-02-23: qty 2

## 2023-02-23 MED ORDER — ONDANSETRON HCL 4 MG/2ML IJ SOLN
INTRAMUSCULAR | Status: DC | PRN
  Administered 2023-02-23: 4 mg via INTRAVENOUS

## 2023-02-23 MED ORDER — PROMETHAZINE HCL 25 MG/ML IJ SOLN: 6.2500 mg | INTRAMUSCULAR | Status: DC | PRN

## 2023-02-23 MED ORDER — TRAMADOL HCL 50 MG PO TABS: 50.0000 mg | ORAL_TABLET | Freq: Four times a day (QID) | ORAL | 0 refills | Status: AC | PRN

## 2023-02-23 SURGICAL SUPPLY — 41 items
APL SKNCLS STERI-STRIP NONHPOA (GAUZE/BANDAGES/DRESSINGS) ×2
BENZOIN TINCTURE PRP APPL 2/3 (GAUZE/BANDAGES/DRESSINGS) ×2 IMPLANT
BLADE SURG 15 STRL LF DISP TIS (BLADE) ×2 IMPLANT
BLADE SURG 15 STRL SS (BLADE) ×2
BRIEF MESH DISP LRG (UNDERPADS AND DIAPERS) ×2 IMPLANT
COVER BACK TABLE 60X90IN (DRAPES) ×2 IMPLANT
COVER MAYO STAND STRL (DRAPES) ×2 IMPLANT
DRAPE LAPAROTOMY 100X72 PEDS (DRAPES) ×2 IMPLANT
DRAPE UTILITY XL STRL (DRAPES) ×2 IMPLANT
GAUZE 4X4 16PLY ~~LOC~~+RFID DBL (SPONGE) ×2 IMPLANT
GAUZE PAD ABD 8X10 STRL (GAUZE/BANDAGES/DRESSINGS) ×2 IMPLANT
GAUZE SPONGE 4X4 12PLY STRL (GAUZE/BANDAGES/DRESSINGS) IMPLANT
GLOVE BIO SURGEON STRL SZ 6 (GLOVE) IMPLANT
GLOVE BIO SURGEON STRL SZ7.5 (GLOVE) ×2 IMPLANT
GLOVE BIOGEL PI IND STRL 6 (GLOVE) IMPLANT
GLOVE BIOGEL PI IND STRL 7.0 (GLOVE) IMPLANT
GLOVE INDICATOR 8.0 STRL GRN (GLOVE) ×2 IMPLANT
GOWN STRL REUS W/TWL LRG LVL3 (GOWN DISPOSABLE) IMPLANT
GOWN STRL REUS W/TWL XL LVL3 (GOWN DISPOSABLE) ×2 IMPLANT
IV CATH 14GX2 1/4 (CATHETERS) IMPLANT
IV CATH 18G SAFETY (IV SOLUTION) IMPLANT
KIT TURNOVER CYSTO (KITS) ×2 IMPLANT
LOOP VASCLR MAXI BLUE 18IN ST (MISCELLANEOUS) IMPLANT
LOOP VASCULAR MAXI 18 BLUE (MISCELLANEOUS) ×2
LOOPS VASCLR MAXI BLUE 18IN ST (MISCELLANEOUS) ×2 IMPLANT
NDL HYPO 22X1.5 SAFETY MO (MISCELLANEOUS) ×2 IMPLANT
NEEDLE HYPO 22X1.5 SAFETY MO (MISCELLANEOUS) ×2 IMPLANT
NS IRRIG 500ML POUR BTL (IV SOLUTION) ×2 IMPLANT
PACK BASIN DAY SURGERY FS (CUSTOM PROCEDURE TRAY) ×2 IMPLANT
PENCIL SMOKE EVACUATOR (MISCELLANEOUS) ×2 IMPLANT
SOL PREP POV-IOD 4OZ 10% (MISCELLANEOUS) IMPLANT
SPIKE FLUID TRANSFER (MISCELLANEOUS) ×2 IMPLANT
SUT SILK 2 0 (SUTURE) ×2
SUT SILK 2-0 18XBRD TIE 12 (SUTURE) IMPLANT
SYR BULB IRRIG 60ML STRL (SYRINGE) ×2 IMPLANT
SYR CONTROL 10ML LL (SYRINGE) ×2 IMPLANT
TOWEL OR 17X24 6PK STRL BLUE (TOWEL DISPOSABLE) ×2 IMPLANT
TRAY DSU PREP LF (CUSTOM PROCEDURE TRAY) ×2 IMPLANT
TUBE CONNECTING 12X1/4 (SUCTIONS) ×2 IMPLANT
VASCULAR TIE MAXI BLUE 18IN ST (MISCELLANEOUS) ×2
YANKAUER SUCT BULB TIP NO VENT (SUCTIONS) ×2 IMPLANT

## 2023-02-23 NOTE — Op Note (Signed)
02/23/2023  8:35 AM  PATIENT:  Jill [name]  57 y.o. female  Patient Care Team: Mirna Mires, MD as PCP - General (Family Medicine) Wyline Mood, Dorothe Pea, MD as PCP - Cardiology (Cardiology) Gaynelle Adu, MD as Consulting Physician [facility]) Branch, Dorothe Pea, MD as Consulting Physician (Cardiology) Karie Soda, MD as Consulting Physician (General Surgery)  PRE-OPERATIVE DIAGNOSIS:  Possible anal fistula  POST-OPERATIVE DIAGNOSIS:  Transsphincteric anal fistula  PROCEDURE:   Surgical treatment of transsphincteric anal fistula with placement of draining seton Incision and drainage of perianal abscess Anorectal exam under anesthesia  SURGEON:  Surgeon(s): Andria Meuse, MD  ASSISTANT: OR Staff   ANESTHESIA:   local and general  SPECIMEN:  No Specimen  DISPOSITION OF SPECIMEN:  N/A  COUNTS:  Sponge, needle, and instrument counts were reported correct x2 at conclusion.  EBL: 2 mL  Drains: None  PLAN OF CARE: Discharge to home after PACU  PATIENT DISPOSITION:  PACU - hemodynamically stable.  OR FINDINGS: Transsphincteric anal fistula with external opening in the right posterior position and internal opening in the posterior midline.  Associated perianal abscess, 1 x 1 cm.  Suspect this will likely be amenable to a LIFT procedure down the road  DESCRIPTION: The patient was identified in the preoperative holding area and taken to the OR. SCDs were applied. She then underwent general endotracheal anesthesia without difficulty. The patient was then rolled onto the OR table in the prone jackknife position. Pressure points were then evaluated and padded. Benzoin was applied to the buttocks and they were gently taped apart.  She was then prepped and draped in usual sterile fashion.  A surgical timeout was performed indicating the correct patient, procedure, and positioning.  A perianal block was then created using a dilute mixture of 0.25% Marcaine with epinephrine and  Exparel.  After ascertaining an appropriate level of anesthesia had been achieved, a well lubricated digital rectal exam was performed. This demonstrated no palpable abnormalities.  A Hill-Ferguson anoscope was into the anal canal and circumferential inspection demonstrated healthy appearing anoderm.  No obvious internal opening.  Externally, there is an evident perianal abscess with thinned skin, 1 x 1 cm in size at the location of what I suspect to be an external opening.  This has however closed.  This area is incised and a tense abscess cavity is immediately encountered and we drained approximately 3 cc of milky  pus.  There is a palpable cord emanating within the posterior portion of the perianal tissues towards the rectum.  A flexible fistula probe was then cautiously introduced into this abscess cavity and we are able to identify a tract emanating from the right posterior perianal skin towards the posterior midline of the anal canal.  An internal opening is able to be readily identified without difficulty at approximately the 12 o'clock position in the posterior midline emanating at the dentate line.  The tract was palpated and found to involve both internal and external sphincter muscles.  This is consistent with a transsphincteric anal fistula.  Given all these findings, we opted to proceed with placement of draining seton.  The fistula probe was exchanged for a blue vessel loop seton which is secured to itself using 2-0 silk sutures and a draining seton configuration.  Hypergranulation tissue at the abscess cavity is fulgurated.  The anal canal circumferentially inspected and there are no other pertinent findings including any ulceration, scarring, or suggestion of perianal Crohn's disease.  Additional local anesthetic is infiltrated.  All  sponge, needle, and instrument counts were reported correct.  The buttocks are untaped.  A dressing consisting of 4 x 4's, ABD, mesh under was placed.  She  was then rolled back onto a stretcher, awakened from anesthesia, extubated, and transported to recovery in satisfactory condition.  DISPOSITION: PACU in satisfactory condition.

## 2023-02-23 NOTE — Discharge Instructions (Addendum)
ANORECTAL SURGERY: POST OP INSTRUCTIONS  You were found to have a "trans-sphincteric anal fistula" that involved enough sphincter muscle that we did not 'open the tract up.' Instead, we placed a draining seton which will allow the tract that caused your abscess to mature and may result in your next procedure being more successful... as opposed to attempting that surgery today. We generally leave setons in place for ~ 3 months to allow the tract to develop more robust scar. After this time period, we can proceed with a procedure in an attempt to eliminate the fistula. For now, the seton you have in place is a flexible rubber band that you do not need to worry about dislodging. You may bathe normally and have bowel movements normally. The seton is water proof. Nothing special needs to be done from a wound care perspective, some patients have some drainage from the area with this in place that should be able to be controlled with a piece of gauze or underwear pad.   DIET: Follow a light bland diet the first 24 hours after arrival home, such as soup, liquids, crackers, etc.  Be sure to include lots of fluids daily.  Avoid fast food or heavy meals as your are more likely to get nauseated.  Eat a low fat diet the next few days after surgery.   Some bleeding with bowel movements is expected for the first couple of days but this should stop in between bowel movements  Take your usually prescribed home medications unless otherwise directed. No foreign bodies per rectum for the next 3 months (enemas, etc)  PAIN CONTROL: It is helpful to take an over-the-counter pain medication regularly for the first few days/weeks.  Choose from the following that works best for you: Ibuprofen (Advil, etc) Three 200mg  tabs every 6 hours as needed. Acetaminophen (Tylenol, etc) 500-650mg  every 6 hours as needed NOTE: You may take both of these medications together - most patients find it most helpful when alternating between the  two (i.e. Ibuprofen at 6am, tylenol at 9am, ibuprofen at 12pm ..Marland Kitchen) A  prescription for pain medication may have been prescribed for you at discharge.  Take your pain medication as prescribed.  If you are having problems/concerns with the prescription medicine, please call us for further advice.  Avoid getting constipated.  Between the surgery and the pain medications, it is common to experience some constipation.  Increasing fluid intake (64oz of water per day) and taking a fiber supplement (such as Metamucil, Citrucel, FiberCon) 1-2 times a day regularly will usually help prevent this problem from occurring.  Take Miralax (over the counter) 1-2x/day while taking a narcotic pain medication. If no bowel movement after 48hours, you may additionally take a laxative like a bottle of Milk of Magnesia which can be purchased over the counter. Avoid enemas.   Watch out for diarrhea.  If you have many loose bowel movements, simplify your diet to bland foods.  Stop any stool softeners and decrease your fiber supplement. If this worsens or does not improve, please call us.  Wash / shower every day.  If you were discharged with a dressing, you may remove this the day after your surgery. You may shower normally, getting soap/water on your wound, particularly after bowel movements.  Soaking in a warm bath filled a couple inches ("Sitz bath") is a great way to clean the area after a bowel movement and many patients find it is a way to soothe the area.  ACTIVITIES as tolerated:  You may resume regular (light) daily activities beginning the next day--such as daily self-care, walking, climbing stairs--gradually increasing activities as tolerated.  If you can walk 30 minutes without difficulty, it is safe to try more intense activity such as jogging, treadmill, bicycling, low-impact aerobics, etc. Refrain from any heavy lifting or straining for the first 2 weeks after your procedure, particularly if your surgery was for  hemorrhoids. Avoid activities that make your pain worse You may drive when you are no longer taking prescription pain medication, you can comfortably wear a seatbelt, and you can safely maneuver your car and apply brakes.  FOLLOW UP in our office Please call [facility] at [phone] to set up an appointment to see your surgeon in the office for a follow-up appointment approximately 2 weeks after your surgery. Make sure that you call for this appointment the day you arrive home to insure a convenient appointment time.  9. If you have disability or family leave forms that need to be completed, you may have them completed by your primary care physician's office; for return to work instructions, please ask our office staff and they will be happy to assist you in obtaining this documentation   When to call us [phone]: Poor pain control Reactions / problems with new medications (rash/itching, etc)  Fever over 101.5 F (38.5 C) Inability to urinate Nausea/vomiting Worsening swelling or bruising Continued bleeding from incision. Increased pain, redness, or drainage from the incision  The clinic staff is available to answer your questions during regular business hours (8:30am-5pm).  Please don't hesitate to call and ask to speak to one of our nurses for clinical concerns.   A surgeon from [facility] is always on call at the hospitals   If you have a medical emergency, go to the nearest emergency room or call 911.   [facility] [address], [place], [place]  [address] MAIN: [phone] FAX: [phone] [email or web address]  Information for Discharge Teaching: EXPAREL (bupivacaine liposome injectable suspension)   Your surgeon or anesthesiologist gave you EXPAREL(bupivacaine) to help control your pain after surgery.  EXPAREL is a local anesthetic that provides pain relief by numbing the tissue around the  surgical site. EXPAREL is designed to release pain medication over time and can control pain for up to 72 hours. Depending on how you respond to EXPAREL, you may require less pain medication during your recovery.  Possible side effects: Temporary loss of sensation or ability to move in the area where bupivacaine was injected. Nausea, vomiting, constipation Rarely, numbness and tingling in your mouth or lips, lightheadedness, or anxiety may occur. Call your doctor right away if you think you may be experiencing any of these sensations, or if you have other questions regarding possible side effects.  Follow all other discharge instructions given to you by your surgeon or nurse. Eat a healthy diet and drink plenty of water or other fluids.  If you return to the hospital for any reason within 96 hours following the administration of EXPAREL, it is important for health care providers to know that you have received this anesthetic. A teal colored band has been placed on your arm with the date, time and amount of EXPAREL you have received in order to alert and inform your health care providers. Please leave this armband in place for the full 96 hours following administration, and then you may remove the band.  Post Anesthesia Home Care Instructions  Activity:  Get plenty of rest for the remainder of the day. A responsible individual must stay with you for 24 hours following the procedure.  For the next 24 hours, DO NOT: -Drive a car -Advertising copywriter -Drink alcoholic beverages -Take any medication unless instructed by your physician -Make any legal decisions or sign important papers.  Meals: Start with liquid foods such as gelatin or soup. Progress to regular foods as tolerated. Avoid greasy, spicy, heavy foods. If nausea and/or vomiting occur, drink only clear liquids until the nausea and/or vomiting subsides. Call your physician if vomiting continues.  Special Instructions/Symptoms: Your  throat may feel dry or sore from the anesthesia or the breathing tube placed in your throat during surgery. If this causes discomfort, gargle with warm salt water. The discomfort should disappear within 24 hours.  No acetaminophen/Tylenol until after 12:30 pm today if needed.

## 2023-02-23 NOTE — Transfer of Care (Signed)
Immediate Anesthesia Transfer of Care Note  Patient: Jill [name]  Procedure(s) Performed: Procedure(s) (LRB): EXAM UNDER ANESTHESIA (N/A) SURGICAL TREATMENT OF ANAL FISTULA-TRANSSPHINTERIL WITH PLACEMENT OF SETON (N/A) INCISION AND DRAINAGE OF PERI RECTAL ABSCESS  Patient Location: PACU  Anesthesia Type: General  Level of Consciousness: awake, oriented, sedated and patient cooperative  Airway & Oxygen Therapy: Patient Spontanous Breathing and Patient connected to face mask oxygen  Post-op Assessment: Report given to PACU RN and Post -op Vital signs reviewed and stable  Post vital signs: Reviewed and stable  Complications: No apparent anesthesia complications Last Vitals:  Vitals Value Taken Time  BP 148/64 02/23/23 0834  Temp 36.4 C 02/23/23 0833  Pulse 68 02/23/23 0834  Resp 17 02/23/23 0834  SpO2 99 % 02/23/23 0834  Vitals shown include unfiled device data.  Last Pain:  Vitals:   02/23/23 0554  TempSrc: Oral         Complications: No notable events documented.

## 2023-02-23 NOTE — Anesthesia Preprocedure Evaluation (Signed)
Anesthesia Evaluation  Patient identified by MRN, date of birth, ID band Patient awake    Reviewed: Allergy & Precautions, NPO status , Patient's Chart, lab work & pertinent test results, reviewed documented beta blocker date and time   History of Anesthesia Complications (+) PONV and history of anesthetic complications  Airway Mallampati: II  TM Distance: >3 FB Neck ROM: Full    Dental  (+) Teeth Intact, Dental Advisory Given   Pulmonary sleep apnea , Current Smoker and Patient abstained from smoking.   breath sounds clear to auscultation       Cardiovascular hypertension, Pt. on medications and Pt. on home beta blockers + Valvular Problems/Murmurs  Rhythm:Regular Rate:Normal  Echo (2021) 1. Left ventricular ejection fraction, by estimation, is 60 to 65%. The  left ventricle has normal function. The left ventricle has no regional  wall motion abnormalities. There is moderate left ventricular hypertrophy.  Left ventricular diastolic  parameters are consistent with Grade I diastolic dysfunction (impaired  relaxation).  2. Right ventricular systolic function is normal. The right ventricular  size is normal.  3. The mitral valve is normal in structure. No evidence of mitral valve  regurgitation. No evidence of mitral stenosis.  4. The aortic valve is tricuspid. Aortic valve regurgitation is not  visualized. No aortic stenosis is present.  5. The inferior vena cava is dilated in size with >50% respiratory  variability, suggesting right atrial pressure of 8 mmHg.    Neuro/Psych Seizures -, Well Controlled,   Anxiety     CVA, Residual Symptoms    GI/Hepatic Neg liver ROS,GERD  Medicated and Controlled,,  Endo/Other  diabetes, Type 2, Insulin Dependent, Oral Hypoglycemic Agents    Renal/GU Renal disease     Musculoskeletal   Abdominal Normal abdominal exam  (+)   Peds  Hematology   Anesthesia Other Findings    Reproductive/Obstetrics                             Anesthesia Physical Anesthesia Plan  ASA: 3  Anesthesia Plan: General   Post-op Pain Management: Tylenol PO (pre-op)*   Induction: Intravenous  PONV Risk Score and Plan: 3 and Ondansetron, Dexamethasone, Midazolam and Treatment may vary due to age or medical condition  Airway Management Planned: Oral ETT  Additional Equipment: None  Intra-op Plan:   Post-operative Plan: Extubation in OR  Informed Consent: I have reviewed the patients History and Physical, chart, labs and discussed the procedure including the risks, benefits and alternatives for the proposed anesthesia with the patient or authorized representative who has indicated his/her understanding and acceptance.     Dental advisory given  Plan Discussed with: CRNA  Anesthesia Plan Comments: ( )        Anesthesia Quick Evaluation

## 2023-02-23 NOTE — H&P (Signed)
CC: Here today for surgery  HPI: Jill [name] is an 57 y.o. female with history of DM, DVT, HLD, HTN, Seizure, CVA, whom is seen in the office today as a referral by Dr. Lavell Anchors for evaluation of possible anal fistula.  She has previously seen one of our PAs, [facility], for evaluation of a perianal abscess. This was back on 11/23/2022.  She has a history of a sleeve gastrectomy with Dr. Andrey Campanile 04/10/2016.  Last colonoscopy 11/18/2021, Dr. Levon Hedger: -Preparation of the colon was inadequate - Submucosal mass in the proximal ascending colon, possibly external compression. Biopsied.  Pathology: Benign.  Reports a history now of at least 2 to 3 years of perianal swelling and then drainage in the exact same location-right buttock.  She has previously seen one of my partners, Dr. Michaell Cowing, back in 2021. He had scheduled her for a planned procedure to evaluate her perianal wound but she suspected to be a fistula that time. She opted not to proceed with scheduling at that time and instead monitor her symptoms. They have persisted and now she returns to discuss potentially doing something more.  For the last month, she has had 3 discrete episodes where there has been significant perianal pain and swelling at this right sided location with subsequent drainage and alleviation of her symptoms. The frequency of these events has become more frequent over the last couple of years. She denies any prior anorectal surgeries or procedures. She denies any known history of hemorrhoids or anal fissures/tears.  She denies any history of incontinence to gas, liquid, or solid stool. Reports good control. Reports she can get to the bathroom that any difficulty.  She denies any changes in health or health history since we met in the office. No new medications/allergies. She states she is ready for surgery today. PMH: DM, DVT, HLD, HTN, Seizure, CVA - on aspirin and plavix  PSH: Sleeve gastrectomy, 2017  FHx:  Denies any known family history of colorectal, breast, endometrial or ovarian cancer  Social Hx: Smokes 10 cigarettes per day. Denies use of EtOH/illicit drug.    Past Medical History:  Diagnosis Date   Anemia    Anxiety    Back pain    complains when laying on hard flat surface   Chronic kidney disease    appt. /w urology 11/10/2014- for a cyst seen on [place]. evaluated was told it was nothing.   Diabetes mellitus    Diabetes II, oral and insulin.   DVT (deep venous thrombosis) (HCC)    right leg '09 or '10   Fibroids    GERD (gastroesophageal reflux disease)    Heart murmur    History of kidney stones    History of stress test    referred for cardiac cath- done here at [facility]- 09/2014   Hypercholesterolemia    Hypertension    Neuromuscular disorder (HCC)    neuropathy   PONV (postoperative nausea and vomiting)    Seizures (HCC)    02/01/23- last seizure approximately 2015;   Sleep apnea    no longer uses a cpap   Stroke [facility])    '09-had blockage in brain, too deep to operate"occ left lower extremity fatique, occ. 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    Past Surgical History:  Procedure Laterality Date   APPENDECTOMY     BIOPSY  11/18/2021   Procedure: BIOPSY;  Surgeon: Levon Hedger  Alisia Ferrari, MD;  [facility];  Service: Gastroenterology;;  random colon;   BIOPSY  12/27/2021   Procedure: BIOPSY;  Surgeon: Dolores Frame, MD;  [facility]: [facility];  Service: Gastroenterology;;   CARDIAC CATHETERIZATION     CESAREAN SECTION  1986 & 1988   x2   CHOLECYSTECTOMY     open"gallstones"   COLONOSCOPY WITH PROPOFOL N/A 11/18/2021   Procedure: COLONOSCOPY WITH PROPOFOL;  Surgeon: Dolores Frame, MD;  [facility];  Service: Gastroenterology;  Laterality: N/A;  1215   DILATATION & CURETTAGE/HYSTEROSCOPY WITH MYOSURE N/A 05/10/2017   Procedure:  DILATATION & CURETTAGE/HYSTEROSCOPY WITH MYOSURE;  Surgeon: Maxie Better, MD;  Location: [facility];  Service: Gynecology;  Laterality: N/A;   ESOPHAGOGASTRODUODENOSCOPY (EGD) WITH PROPOFOL N/A 12/27/2021   Procedure: ESOPHAGOGASTRODUODENOSCOPY (EGD) WITH PROPOFOL;  Surgeon: Dolores Frame, MD;  [facility];  Service: Gastroenterology;  Laterality: N/A;  100   ESOPHAGOGASTRODUODENOSCOPY (EGD) WITH PROPOFOL N/A 01/13/2022   Procedure: ESOPHAGOGASTRODUODENOSCOPY (EGD) WITH PROPOFOL;  Surgeon: Dolores Frame, MD;  [facility];  Service: Gastroenterology;  Laterality: N/A;  235, per Soledad Gerlach pt knows to arrive at 6:15   HEMOSTASIS CLIP PLACEMENT  01/13/2022   Procedure: HEMOSTASIS CLIP PLACEMENT;  Surgeon: Dolores Frame, MD;  [facility];  Service: Gastroenterology;;   INTERVENTIONAL RADIOLOGY PROCEDURE     CT angiography /neck -Dr. Corliss Skains.   IR ANGIO INTRA EXTRACRAN SEL COM CAROTID INNOMINATE BILAT MOD SED  02/22/2018   IR ANGIO INTRA EXTRACRAN SEL COM CAROTID INNOMINATE BILAT MOD SED  03/08/2022   IR ANGIO VERTEBRAL SEL VERTEBRAL BILAT MOD SED  02/22/2018   IR ANGIO VERTEBRAL SEL VERTEBRAL UNI R MOD SED  03/08/2022   IR GENERIC HISTORICAL  08/03/2016   IR RADIOLOGIST EVAL & MGMT 08/03/2016 [facility]   IR RADIOLOGIST EVAL & MGMT  01/03/2022   LAPAROSCOPIC GASTRIC SLEEVE RESECTION N/A 04/10/2016   Procedure: LAPAROSCOPIC GASTRIC SLEEVE RESECTION WITH UPPER ENDO;  Surgeon: Gaynelle Adu, MD;  Location: [facility];  Service: General;  Laterality: N/A;   LEFT HEART CATHETERIZATION WITH CORONARY ANGIOGRAM N/A 09/28/2014   Procedure: LEFT HEART CATHETERIZATION WITH CORONARY ANGIOGRAM;  Surgeon: Corky Crafts, MD;  Location: [facility];  Service: Cardiovascular;  Laterality: N/A;   NEPHROLITHOTOMY Right 02/01/2022   Procedure: RIGHT NEPHROLITHOTOMY PERCUTANEOUS WITH SURGEON ACCESS;  Surgeon: Crist Fat, MD;  Location: [facility];   Service: Urology;  Laterality: Right;   NEPHROLITHOTOMY Left 02/15/2022   Procedure: LEFT NEPHROLITHOTOMY PERCUTANEOUS WITH SURGEON ACCESS, REMOVAL OF RIGHT URETERAL STENT;  Surgeon: Crist Fat, MD;  Location: [facility];  Service: Urology;  Laterality: Left;  195 MINUTES   POLYPECTOMY  01/13/2022   Procedure: POLYPECTOMY;  Surgeon: Dolores Frame, MD;  [facility];  Service: Gastroenterology;;   RADIOLOGY WITH ANESTHESIA N/A 10/14/2014   Procedure: ANGIOPLASTY;  Surgeon: Julieanne Cotton, MD;  Location: [facility] OR;  Service: Radiology;  Laterality: N/A;   TUBAL LIGATION      Family History  Problem Relation Age of Onset   Cataracts Mother    Kidney disease Mother        dialysis for 26 years   Heart attack Mother        13's   Diabetes Father    Cataracts Father    Heart failure Father    Heart disease Father    Heart attack Father        61's  Stroke Paternal Grandmother     Social:  reports that she has been smoking cigarettes. She started smoking about 47 years ago. She has a 11.9 pack-year smoking history. She has been exposed to tobacco smoke. She has never used smokeless tobacco. She reports that she does not drink alcohol and does not use drugs.  Allergies:  Allergies  Allergen Reactions   Dilaudid [Hydromorphone Hcl] Nausea And Vomiting   Crab Extract Swelling    Crab Cakes   Crab [Shellfish Allergy] Swelling   Hydrocodone    Hydromorphone     Other Reaction(s): GI Intolerance   Other Other (See Comments)    "pt is a difficult IV stick, prefers IV team paged for IV starts"    Medications: I have reviewed the patient's current medications.  Results for orders placed or performed during the hospital encounter of 02/23/23 (from the past 48 hour(s))  I-STAT, chem 8     Status: Abnormal   Collection Time: 02/23/23  6:33 AM  Result Value Ref Range   Sodium 141 135 - 145 mmol/L   Potassium 4.1 3.5 - 5.1 mmol/L   Chloride 108 98 - 111 mmol/L    BUN 21 (H) 6 - 20 mg/dL   Creatinine, Ser 1.61 (H) 0.44 - 1.00 mg/dL   Glucose, Bld 096 (H) 70 - 99 mg/dL    Comment: Glucose reference range applies only to samples taken after fasting for at least 8 hours.   Calcium, Ion 1.31 1.15 - 1.40 mmol/L   TCO2 23 22 - 32 mmol/L   Hemoglobin 12.9 12.0 - 15.0 g/dL   HCT 04.5 40.9 - 81.1 %    No results found.   PE Blood pressure (!) 127/58, pulse 63, temperature 98.1 F (36.7 C), temperature source Oral, resp. rate 14, height 5\' 6"  (1.676 m), weight 99.9 kg, last menstrual period 12/10/2014, SpO2 97%. Constitutional: NAD; conversant Eyes: Moist conjunctiva; no lid lag; anicteric Lungs: Normal respiratory effort CV: RRR Psychiatric: Appropriate affect  Results for orders placed or performed during the hospital encounter of 02/23/23 (from the past 48 hour(s))  I-STAT, chem 8     Status: Abnormal   Collection Time: 02/23/23  6:33 AM  Result Value Ref Range   Sodium 141 135 - 145 mmol/L   Potassium 4.1 3.5 - 5.1 mmol/L   Chloride 108 98 - 111 mmol/L   BUN 21 (H) 6 - 20 mg/dL   Creatinine, Ser 9.14 (H) 0.44 - 1.00 mg/dL   Glucose, Bld 782 (H) 70 - 99 mg/dL    Comment: Glucose reference range applies only to samples taken after fasting for at least 8 hours.   Calcium, Ion 1.31 1.15 - 1.40 mmol/L   TCO2 23 22 - 32 mmol/L   Hemoglobin 12.9 12.0 - 15.0 g/dL   HCT 95.6 21.3 - 08.6 %    No results found.  A/P: Jill [name] is an 57 y.o. 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  - Cardiac and neurologic clearance from Dr. Wyline Mood and Dr. Corliss Skains for surgery and to ensure it is okay for her to come off Plavix perioperatively.  -The anatomy and physiology of the anal canal was discussed with the patient with associated pictures. The pathophysiology of anal abscess and fistula was discussed at length with associated pictures  and illustrations - using the ASCRS trifold -We have reviewed options going forward including  further observation vs surgery -surgical treatment/control of anal fistula (intersphincteric, transsphincteric) -fistulotomy versus draining seton placement based on intraoperative findings; anorectal exam under anesthesia -The planned procedure, material risks (including, but not limited to, pain, bleeding, infection, scarring, need for blood transfusion, damage to anal sphincter, incontinence of gas and/or stool, need for additional procedures, anal stenosis, rare cases of pelvic sepsis which in severe cases may require things like a colostomy, recurrence, pneumonia, heart attack, stroke, death) benefits and alternatives to surgery were discussed at length. I noted a good probability that the procedure would help improve their symptoms. The patient's questions were answered to her satisfaction, she voiced understanding and elected to proceed with surgery. Additionally, we discussed typical postoperative expectations and the recovery process.  Marin Olp, MD [facility]

## 2023-02-23 NOTE — Anesthesia Procedure Notes (Signed)
Procedure Name: Intubation Date/Time: 02/23/2023 7:40 AM  Performed by: Francie Massing, CRNAPre-anesthesia Checklist: Patient identified, Emergency Drugs available, Suction available and Patient being monitored Patient Re-evaluated:Patient Re-evaluated prior to induction Oxygen Delivery Method: Circle system utilized Preoxygenation: Pre-oxygenation with 100% oxygen Induction Type: IV induction Ventilation: Mask ventilation without difficulty Laryngoscope Size: Mac and 4 Grade View: Grade I Tube type: Oral Tube size: 7.0 mm Number of attempts: 1 Airway Equipment and Method: Stylet and Oral airway Placement Confirmation: ETT inserted through vocal cords under direct vision, positive ETCO2 and breath sounds checked- equal and bilateral Secured at: 22 cm Tube secured with: Tape Dental Injury: Teeth and Oropharynx as per pre-operative assessment

## 2023-02-23 NOTE — Anesthesia Postprocedure Evaluation (Signed)
Anesthesia Post Note  Patient: Jill [name]  Procedure(s) Performed: Francia Greaves UNDER ANESTHESIA (Rectum) SURGICAL TREATMENT OF ANAL FISTULA-TRANSSPHINTERIL WITH PLACEMENT OF SETON (Rectum) INCISION AND DRAINAGE OF PERI RECTAL ABSCESS (Rectum)     Patient location during evaluation: PACU Anesthesia Type: General Level of consciousness: awake and alert Pain management: pain level controlled Vital Signs Assessment: post-procedure vital signs reviewed and stable Respiratory status: spontaneous breathing, nonlabored ventilation and respiratory function stable Cardiovascular status: blood pressure returned to baseline and stable Postop Assessment: no apparent nausea or vomiting Anesthetic complications: no   No notable events documented.  Last Vitals:  Vitals:   02/23/23 0900 02/23/23 1000  BP: 130/68 101/79  Pulse: 70 64  Resp: 19 17  Temp: (!) 36.3 C (!) 35.5 C  SpO2: 97% 97%    Last Pain:  Vitals:   02/23/23 1000  TempSrc:   PainSc: 0-No pain                 Lowella Curb

## 2023-02-26 ENCOUNTER — Encounter (HOSPITAL_BASED_OUTPATIENT_CLINIC_OR_DEPARTMENT_OTHER): Payer: Self-pay | Admitting: Surgery

## 2023-03-06 NOTE — Progress Notes (Signed)
[facility] - Clinic Note  03/13/2023   CHIEF COMPLAINT Patient presents for Retina Follow Up  HISTORY OF PRESENT ILLNESS: Jill [name] is a 57 y.o. female who presents to the clinic today for:  HPI     Retina Follow Up   Patient presents with  Diabetic Retinopathy.  In both eyes.  This started 4 weeks ago.  I, the attending physician,  performed the HPI with the patient and updated documentation appropriately.        Comments   Patient here for 4 weeks retina follow up for PDR OU. Patient states vision doing pretty good. No eye pain. Wearing old glasses.       Last edited by Rennis Chris, MD on 03/13/2023 12:42 PM.     Referring physician: Pecolia Ades, MD [address] [place],  [place] [address]  HISTORICAL INFORMATION:  Selected notes from the MEDICAL RECORD NUMBER Referred by Dr. Ilsa Iha for diabetic retinopathy eval -- new subhyaloid heme OD LEE:  Ocular Hx- PMH-   CURRENT MEDICATIONS: No current outpatient medications on file. (Ophthalmic Drugs)   No current facility-administered medications for this visit. (Ophthalmic Drugs)   Current Outpatient Medications (Other)  Medication Sig   acetaminophen (TYLENOL) 500 MG tablet Take 500-1,000 mg by mouth every 8 (eight) hours as needed for moderate pain or headache.    aspirin EC 81 MG tablet Take 81 mg by mouth every morning.   bismuth subsalicylate (PEPTO BISMOL) 262 MG chewable tablet Chew 524 mg by mouth 3 (three) times daily as needed (stomach pain.).   Calcium-Phosphorus-Vitamin D (CITRACAL +D3 PO) Take 2 tablets by mouth in the morning and at bedtime.   clopidogrel (PLAVIX) 75 MG tablet Take 75 mg by mouth in the morning.   Cyanocobalamin (VITAMIN B-12) 5000 MCG SUBL Place 5,000 mcg under the tongue 3 (three) times a week.   docusate sodium (COLACE) 100 MG capsule Take 100 mg by mouth in the morning.   felodipine (PLENDIL) 5 MG 24 hr tablet Take 5 mg by mouth in the morning.    furosemide (LASIX) 20 MG tablet Take 1 tablet (20 mg total) by mouth daily. May take an extra tablet as needed for swelling   gabapentin (NEURONTIN) 300 MG capsule Take 300 mg by mouth 2 (two) times daily.   lamoTRIgine (LAMICTAL) 100 MG tablet Take 100 mg by mouth every morning.    levETIRAcetam (KEPPRA) 500 MG tablet Take 250 mg by mouth 2 (two) times daily.   linaclotide (LINZESS) 145 MCG CAPS capsule Take 145 mcg by mouth daily before breakfast.   LORazepam (ATIVAN) 0.5 MG tablet Take 0.5 mg by mouth daily as needed for anxiety.   metFORMIN (GLUCOPHAGE) 500 MG tablet Take 500 mg by mouth 2 (two) times daily.   metoprolol succinate (TOPROL-XL) 25 MG 24 hr tablet Take 1 tablet by mouth daily   Multiple Vitamins-Minerals (BARIATRIC MULTIVITAMINS/IRON PO) Take 1 tablet by mouth every evening.   olmesartan (BENICAR) 40 MG tablet Take 40 mg by mouth every morning.    OZEMPIC, 1 MG/DOSE, 2 MG/1.5ML SOPN Inject 1 mg into the skin every 30 (thirty) days.   pantoprazole (PROTONIX) 40 MG tablet Take 1 tablet (40 mg total) by mouth 2 (two) times daily.   pioglitazone (ACTOS) 30 MG tablet Take 30 mg by mouth every morning.   potassium chloride (KLOR-CON M) 10 MEQ tablet TAKE 2 TABLETS BY MOUTH DAILY.   rosuvastatin (CRESTOR) 40 MG tablet Take 1 tablet (40  mg total) by mouth at bedtime.   No current facility-administered medications for this visit. (Other)   REVIEW OF SYSTEMS: ROS   Positive for: Gastrointestinal, Neurological, Endocrine, Cardiovascular, Eyes Negative for: Constitutional, Skin, Genitourinary, Musculoskeletal, HENT, Respiratory, Psychiatric, Allergic/Imm, Heme/Lymph Last edited by Laddie Aquas, COA on 03/13/2023  9:40 AM.     ALLERGIES Allergies  Allergen Reactions   Dilaudid [Hydromorphone Hcl] Nausea And Vomiting   Crab Extract Swelling    Crab Cakes   Crab [Shellfish Allergy] Swelling   Hydrocodone    Hydromorphone     Other Reaction(s): GI Intolerance   Other Other  (See Comments)    "pt is a difficult IV stick, prefers IV team paged for IV starts"   PAST MEDICAL HISTORY Past Medical History:  Diagnosis Date   Anemia    Anxiety    Back pain    complains when laying on hard flat surface   Chronic kidney disease    appt. /w urology 11/10/2014- for a cyst seen on [place]. evaluated was told it was nothing.   Diabetes mellitus    Diabetes II, oral and insulin.   DVT (deep venous thrombosis) (HCC)    right leg '09 or '10   Fibroids    GERD (gastroesophageal reflux disease)    Heart murmur    History of kidney stones    History of stress test    referred for cardiac cath- done here at [facility]- 09/2014   Hypercholesterolemia    Hypertension    Neuromuscular disorder (HCC)    neuropathy   PONV (postoperative nausea and vomiting)    Seizures (HCC)    02/01/23- last seizure approximately 2015;   Sleep apnea    no longer uses a cpap   Stroke [facility])    '09-had blockage in brain, too deep to operate"occ left lower extremity fatique, occ. 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   Past Surgical History:  Procedure Laterality Date   APPENDECTOMY     BIOPSY  11/18/2021   Procedure: BIOPSY;  Surgeon: Dolores Frame, MD;  [facility];  Service: Gastroenterology;;  random colon;   BIOPSY  12/27/2021   Procedure: BIOPSY;  Surgeon: Dolores Frame, MD;  [facility];  Service: Gastroenterology;;   CARDIAC CATHETERIZATION     CESAREAN SECTION  1986 & 1988   x2   CHOLECYSTECTOMY     open"gallstones"   COLONOSCOPY WITH PROPOFOL N/A 11/18/2021   Procedure: COLONOSCOPY WITH PROPOFOL;  Surgeon: Dolores Frame, MD;  [facility]: [facility];  Service: Gastroenterology;  Laterality: N/A;  1215   DILATATION & CURETTAGE/HYSTEROSCOPY WITH MYOSURE N/A 05/10/2017   Procedure: DILATATION & CURETTAGE/HYSTEROSCOPY WITH MYOSURE;  Surgeon:  Maxie Better, MD;  Location: [facility];  Service: Gynecology;  Laterality: N/A;   ESOPHAGOGASTRODUODENOSCOPY (EGD) WITH PROPOFOL N/A 12/27/2021   Procedure: ESOPHAGOGASTRODUODENOSCOPY (EGD) WITH PROPOFOL;  Surgeon: Dolores Frame, MD;  [facility];  Service: Gastroenterology;  Laterality: N/A;  100   ESOPHAGOGASTRODUODENOSCOPY (EGD) WITH PROPOFOL N/A 01/13/2022   Procedure: ESOPHAGOGASTRODUODENOSCOPY (EGD) WITH PROPOFOL;  Surgeon: Dolores Frame, MD;  [facility];  Service: Gastroenterology;  Laterality: N/A;  235, per Soledad Gerlach pt knows to arrive at 6:15   HEMOSTASIS CLIP PLACEMENT  01/13/2022   Procedure: HEMOSTASIS CLIP PLACEMENT;  Surgeon: Dolores Frame, MD;  [facility]: [facility];  Service: Gastroenterology;;  INCISION AND DRAINAGE ABSCESS  02/23/2023   Procedure: INCISION AND DRAINAGE OF PERI RECTAL ABSCESS;  Surgeon: Andria Meuse, MD;  Location: [facility];  Service: General;;   INTERVENTIONAL RADIOLOGY PROCEDURE     CT angiography /neck -Dr. Corliss Skains.   IR ANGIO INTRA EXTRACRAN SEL COM CAROTID INNOMINATE BILAT MOD SED  02/22/2018   IR ANGIO INTRA EXTRACRAN SEL COM CAROTID INNOMINATE BILAT MOD SED  03/08/2022   IR ANGIO VERTEBRAL SEL VERTEBRAL BILAT MOD SED  02/22/2018   IR ANGIO VERTEBRAL SEL VERTEBRAL UNI R MOD SED  03/08/2022   IR GENERIC HISTORICAL  08/03/2016   IR RADIOLOGIST EVAL & MGMT 08/03/2016 [facility]   IR RADIOLOGIST EVAL & MGMT  01/03/2022   LAPAROSCOPIC GASTRIC SLEEVE RESECTION N/A 04/10/2016   Procedure: LAPAROSCOPIC GASTRIC SLEEVE RESECTION WITH UPPER ENDO;  Surgeon: Gaynelle Adu, MD;  Location: [facility];  Service: General;  Laterality: N/A;   LEFT HEART CATHETERIZATION WITH CORONARY ANGIOGRAM N/A 09/28/2014   Procedure: LEFT HEART CATHETERIZATION WITH CORONARY ANGIOGRAM;  Surgeon: Corky Crafts, MD;  Location: [facility] C[facility];  Service: Cardiovascular;  Laterality: N/A;   NEPHROLITHOTOMY Right  02/01/2022   Procedure: RIGHT NEPHROLITHOTOMY PERCUTANEOUS WITH SURGEON ACCESS;  Surgeon: Crist Fat, MD;  Location: [facility];  Service: Urology;  Laterality: Right;   NEPHROLITHOTOMY Left 02/15/2022   Procedure: LEFT NEPHROLITHOTOMY PERCUTANEOUS WITH SURGEON ACCESS, REMOVAL OF RIGHT URETERAL STENT;  Surgeon: Crist Fat, MD;  Location: [facility];  Service: Urology;  Laterality: Left;  195 MINUTES   PLACEMENT OF SETON N/A 02/23/2023   Procedure: SURGICAL TREATMENT OF ANAL FISTULA-TRANSSPHINTERIL WITH PLACEMENT OF SETON;  Surgeon: Andria Meuse, MD;  Location: [facility];  Service: General;  Laterality: N/A;   POLYPECTOMY  01/13/2022   Procedure: POLYPECTOMY;  Surgeon: Dolores Frame, MD;  [facility];  Service: Gastroenterology;;   RADIOLOGY WITH ANESTHESIA N/A 10/14/2014   Procedure: Mingo Amber;  Surgeon: Julieanne Cotton, MD;  Location: [facility];  Service: Radiology;  Laterality: N/A;   TUBAL LIGATION     FAMILY HISTORY Family History  Problem Relation Age of Onset   Cataracts Mother    Kidney disease Mother        dialysis for 26 years   Heart attack Mother        75's   Diabetes Father    Cataracts Father    Heart failure Father    Heart disease Father    Heart attack Father        41's   Stroke Paternal Grandmother    SOCIAL HISTORY Social History   Tobacco Use   Smoking status: Every Day    Current packs/day: 0.25    Average packs/day: 0.3 packs/day for 47.6 years (11.9 ttl pk-yrs)    Types: Cigarettes    Start date: 07/25/1975    Passive exposure: Current   Smokeless tobacco: Never  Vaping Use   Vaping status: Never Used  Substance Use Topics   Alcohol use: Never   Drug use: No       OPHTHALMIC EXAM:  Base Eye Exam     Visual Acuity (Snellen - Linear)       Right Left   Dist cc 20/25 20/30   Dist ph cc 20/20 -2 20/25 -1    Correction: Glasses         Tonometry (Tonopen, 9:38 AM)       Right Left    Pressure 19 12  Pupils       Dark Light Shape React APD   Right 2 1 Round Brisk None   Left 2 1 Round Brisk None         Visual Fields (Counting fingers)       Left Right    Full Full         Extraocular Movement       Right Left    Full, Ortho Full, Ortho         Neuro/Psych     Oriented x3: Yes   Mood/Affect: Normal         Dilation     Both eyes: 1.0% Mydriacyl, 2.5% Phenylephrine @ 9:38 AM           Slit Lamp and Fundus Exam     Slit Lamp Exam       Right Left   Lids/Lashes Dermatochalasis - upper lid Dermatochalasis - upper lid   Conjunctiva/Sclera mild melanosis mild melanosis   Cornea tear film debris tear film debris   Anterior Chamber deep and clear deep and clear   Iris Round and dilated, No NVI Round and dilated, No NVI   Lens 2+ Nuclear sclerosis, 2-3+ Cortical cataract 2+ Nuclear sclerosis, 2-3+ Cortical cataract, 2+ Posterior subcapsular cataract   Anterior Vitreous Vitreous syneresis, interval improvement in vitreous and subhyaloid heme, trace VH remnants settled inferiorly -- white in color Vitreous syneresis         Fundus Exam       Right Left   Disc Pink and Sharp, no NVD, +fibrosis Pink and Sharp, +cupping   C/D Ratio 0.5 0.65   Macula Flat, Good foveal reflex, RPE mottling and clumping, No heme or edema Flat, Good foveal reflex, RPE mottling and clumping, scattered MA/DBH, no frank edema   Vessels attenuated, Tortuous, early patches of NVE inferior midzone attenuated, mild tortuosity, early midzonal NVE   Periphery Attached, boat shaped subhyaloid heme inferior midzone increased and more diffuse Attached, scattered MA, white now boat shaped subhayloid / pre retinal heme just outside IT arcades -- improving, good PRP changes nasal hemisphere           Refraction     Wearing Rx       Sphere Cylinder Axis   Right -2.00 +0.50 178   Left -2.00 +0.75 014           IMAGING AND PROCEDURES  Imaging and  Procedures for 03/13/2023  OCT, Retina - OU - Both Eyes       Right Eye Quality was good. Central Foveal Thickness: 212. Progression has improved. Findings include normal foveal contour, no IRF, no SRF, intraretinal hyper-reflective material (Partial PVD with mild interval improvement in vitreous opacities).   Left Eye Quality was good. Central Foveal Thickness: 209. Progression has improved. Findings include normal foveal contour, no IRF, no SRF, vitreomacular adhesion (No DME, Mild diffuse atrophy, interval improvement in pre-retinal hyper reflective material along inferior arcades -- seen best on widefield, interval improvement in vitreous / subhyaloid opacities).   Notes *Images captured and stored on drive  Diagnosis / Impression:  No DME OU OD: Partial PVD with mild interval improvement in vitreous opacities OS: No DME, Mild diffuse atrophy, interval improvement in pre-retinal hyper reflective material along inferior arcades -- seen best on widefield, interval improvement in vitreous / subhyaloid opacities  Clinical management:  See below  Abbreviations: NFP - Normal foveal profile. CME - cystoid macular edema. PED - pigment epithelial detachment. IRF - intraretinal  fluid. SRF - subretinal fluid. EZ - ellipsoid zone. ERM - epiretinal membrane. ORA - outer retinal atrophy. ORT - outer retinal tubulation. SRHM - subretinal hyper-reflective material. IRHM - intraretinal hyper-reflective material      Intravitreal Injection, Pharmacologic Agent - OD - Right Eye       Time Out 03/13/2023. 10:28 AM. Confirmed correct patient, procedure, site, and patient consented.   Anesthesia Topical anesthesia was used. Anesthetic medications included Lidocaine 2%, Proparacaine 0.5%.   Procedure Preparation included 5% betadine to ocular surface, eyelid speculum. A supplied needle was used.   Injection: 1.25 mg Bevacizumab 1.25mg /0.54ml   Route: Intravitreal, Site: Right Eye   NDC:  P3213405, Lot: [id number] , Expiration date: 04/07/2023   Post-op Post injection exam found visual acuity of at least counting fingers. The patient tolerated the procedure well. There were no complications. The patient received written and verbal post procedure care education.      Intravitreal Injection, Pharmacologic Agent - OS - Left Eye       Time Out 03/13/2023. 10:29 AM. Confirmed correct patient, procedure, site, and patient consented.   Anesthesia Topical anesthesia was used. Anesthetic medications included Lidocaine 2%, Proparacaine 0.5%.   Procedure Preparation included 5% betadine to ocular surface, eyelid speculum. A supplied needle was used.   Injection: 1.25 mg Bevacizumab 1.25mg /0.48ml   Route: Intravitreal, Site: Left Eye   NDC: P3213405, Lot: [id number], Expiration date: 04/08/2023   Post-op Post injection exam found visual acuity of at least counting fingers. The patient tolerated the procedure well. There were no complications. The patient received written and verbal post procedure care education.           ASSESSMENT/PLAN:   ICD-10-CM   1. 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 Subhyaloid hemorrhage of both eyes  H35.63     3. Long term (current) use of oral hypoglycemic drugs  Z79.84     4. Long-term (current) use of injectable non-insulin antidiabetic drugs  Z79.85     5. Essential hypertension  I10     6. Hypertensive retinopathy of both eyes  H35.033     7. Combined forms of age-related cataract of both eyes  H25.813      1-4. Proliferative diabetic retinopathy w/o DME, OU  - s/p IVA OD #1 (05.23.24), #2 (06.24.24), #3  (06.24.24)  - s/p IVA OS #1 (06.24.24), #2 (06.24.24)  - s/p PRP OS (06.03.24) -- incomplete / aborted due to history of photogenic seizures  - OD with subhyaloid heme -- improved; OS with persistent subhyaloid heme today - FA (05.23.24) shows OD: Boat shaped blockage inferior midzone from subhyaloid heme, focal NVE and vascular non-perfusion IN midzone; OS: Scattered punctate NVE greatest inferior midzone -- pt needs PRP OU - repeat FA (07.23.24): OD: low fluorescein signal, focal NVE and vascular non-perfusion IN midzone -- improved; OS: Low fluorescein signal, punctate NVE greatest inferior midzone -- improved, new boat shaped heme inferior to disc - OCT shows OD: Partial PVD with mild interval improvement in vitreous opacities; OS: No DME, Mild diffuse atrophy, interval improvement in pre-retinal hyper reflective material along inferior arcades -- seen best on widefield, interval  improvement in vitreous / subhyaloid opacities - recommend IVA OU (OD #4 and OS #3) today, 08.20.24 for PDR and subhyaloid hemes - pt wishes to proceed  - RBA of procedure discussed, questions answered - informed consent obtained and signed - see procedure note - discussed benefit of PRP laser OU and concern with history of photogenic seizures -- may need to have laser in OR under general anesthesia - working plan is to stabilize hemorrhages with anti-VEGF therapy and then potentially take pt to OR for PRP OU - f/u 4 weeks -- DFE/OCT  5,6. Hypertensive retinopathy OU - discussed importance of tight BP control - monitor  7. Mixed Cataract OU - The symptoms of cataract, surgical options, and treatments and risks were discussed with patient. - discussed diagnosis and progression - monitor  8. Hx of photogenic seizures  Ophthalmic Meds Ordered this visit:  Meds ordered this encounter  Medications   Bevacizumab (AVASTIN) SOLN 1.25 mg   Bevacizumab (AVASTIN) SOLN 1.25 mg     Return in about 4 weeks (around  04/10/2023) for f/u PDR OU, DFE, OCT.  There are no Patient Instructions on file for this visit.  Explained the diagnoses, plan, and follow up with the patient and they expressed understanding.  Patient expressed understanding of the importance of proper follow up care.   This document serves as a record of services personally performed by Karie Chimera, MD, PhD. It was created on their behalf by Laurey Morale, COT an ophthalmic technician. The creation of this record is the provider's dictation and/or activities during the visit.    Electronically signed by:  Charlette Caffey, COT  03/13/23 12:43 PM  This document serves as a record of services personally performed by Karie Chimera, MD, PhD. It was created on their behalf by Glee Arvin. Manson Passey, OA an ophthalmic technician. The creation of this record is the provider's dictation and/or activities during the visit.    Electronically signed by: Glee Arvin. Manson Passey, OA 03/13/23 12:43 PM  Karie Chimera, M.D., Ph.D. Diseases & Surgery of the Retina and Vitreous [facility]  I have reviewed the above documentation for accuracy and completeness, and I agree with the above. Karie Chimera, M.D., Ph.D. 03/13/23 12:44 PM  Abbreviations: M myopia (nearsighted); A astigmatism; H hyperopia (farsighted); P presbyopia; Mrx spectacle prescription;  CTL contact lenses; OD right eye; OS left eye; OU both eyes  XT exotropia; ET esotropia; PEK punctate epithelial keratitis; PEE punctate epithelial erosions; DES dry eye syndrome; MGD meibomian gland dysfunction; ATs artificial tears; PFAT's preservative free artificial tears; NSC nuclear sclerotic cataract; PSC posterior subcapsular cataract; ERM epi-retinal membrane; PVD posterior vitreous detachment; RD retinal detachment; DM diabetes mellitus; DR diabetic retinopathy; NPDR non-proliferative diabetic retinopathy; PDR proliferative diabetic retinopathy; CSME clinically significant  macular edema; DME diabetic macular edema; dbh dot blot hemorrhages; CWS cotton wool spot; POAG primary open angle glaucoma; C/D cup-to-disc ratio; HVF humphrey visual field; GVF goldmann visual field; OCT optical coherence tomography; IOP intraocular pressure; BRVO Branch retinal vein occlusion; CRVO central retinal vein occlusion; CRAO central retinal artery occlusion; BRAO branch retinal artery occlusion; RT retinal tear; SB scleral buckle; PPV pars plana vitrectomy; VH Vitreous hemorrhage; PRP panretinal laser photocoagulation; IVK intravitreal kenalog; VMT vitreomacular traction; MH Macular hole;  NVD neovascularization of the disc; NVE neovascularization elsewhere; AREDS age related eye disease study; ARMD age related macular degeneration; POAG primary open angle glaucoma; EBMD epithelial/anterior basement membrane dystrophy; ACIOL anterior chamber intraocular lens;  IOL intraocular lens; PCIOL posterior chamber intraocular lens; Phaco/IOL phacoemulsification with intraocular lens placement; PRK photorefractive keratectomy; LASIK laser assisted in situ keratomileusis; HTN hypertension; DM diabetes mellitus; COPD chronic obstructive pulmonary disease

## 2023-03-13 ENCOUNTER — Ambulatory Visit (INDEPENDENT_AMBULATORY_CARE_PROVIDER_SITE_OTHER): Payer: BC Managed Care – PPO | Admitting: Ophthalmology

## 2023-03-13 ENCOUNTER — Encounter (INDEPENDENT_AMBULATORY_CARE_PROVIDER_SITE_OTHER): Payer: Self-pay | Admitting: Ophthalmology

## 2023-03-13 DIAGNOSIS — Z7984 Long term (current) use of oral hypoglycemic drugs: Secondary | ICD-10-CM

## 2023-03-13 DIAGNOSIS — H25813 Combined forms of age-related cataract, bilateral: Secondary | ICD-10-CM

## 2023-03-13 DIAGNOSIS — Z7985 Long-term (current) use of injectable non-insulin antidiabetic drugs: Secondary | ICD-10-CM

## 2023-03-13 DIAGNOSIS — H35033 Hypertensive retinopathy, bilateral: Secondary | ICD-10-CM

## 2023-03-13 DIAGNOSIS — E113593 Type 2 diabetes mellitus with proliferative diabetic retinopathy without macular edema, bilateral: Secondary | ICD-10-CM | POA: Diagnosis not present

## 2023-03-13 DIAGNOSIS — H3563 Retinal hemorrhage, bilateral: Secondary | ICD-10-CM

## 2023-03-13 DIAGNOSIS — I10 Essential (primary) hypertension: Secondary | ICD-10-CM

## 2023-03-13 MED ORDER — BEVACIZUMAB CHEMO INJECTION 1.25MG/0.05ML SYRINGE FOR KALEIDOSCOPE
1.2500 mg | INTRAVITREAL | Status: AC | PRN
Start: 2023-03-13 — End: 2023-03-13
  Administered 2023-03-13: 1.25 mg via INTRAVITREAL

## 2023-03-27 NOTE — Progress Notes (Signed)
[facility] - Clinic Note  04/10/2023   CHIEF COMPLAINT Patient presents for Retina Follow Up  HISTORY OF PRESENT ILLNESS: Jill [name] is a 57 y.o. female who presents to the clinic today for:  HPI     Retina Follow Up   Patient presents with  Diabetic Retinopathy.  In both eyes.  This started 4 weeks ago.  I, the attending physician,  performed the HPI with the patient and updated documentation appropriately.        Comments   Patient here for 4 weeks retina follow up for PDR OU. Patient states vision is pretty good. No eye pain.       Last edited by Rennis Chris, MD on 04/10/2023 12:53 PM.    Pt states the blood continues to clear  Referring physician: Pecolia Ades, MD [address] [place],  [place] [address]  HISTORICAL INFORMATION:  Selected notes from the MEDICAL RECORD NUMBER Referred by Dr. Ilsa Iha for diabetic retinopathy eval -- new subhyaloid heme OD LEE:  Ocular Hx- PMH-   CURRENT MEDICATIONS: No current outpatient medications on file. (Ophthalmic Drugs)   No current facility-administered medications for this visit. (Ophthalmic Drugs)   Current Outpatient Medications (Other)  Medication Sig   acetaminophen (TYLENOL) 500 MG tablet Take 500-1,000 mg by mouth every 8 (eight) hours as needed for moderate pain or headache.    aspirin EC 81 MG tablet Take 81 mg by mouth every morning.   bismuth subsalicylate (PEPTO BISMOL) 262 MG chewable tablet Chew 524 mg by mouth 3 (three) times daily as needed (stomach pain.).   Calcium-Phosphorus-Vitamin D (CITRACAL +D3 PO) Take 2 tablets by mouth in the morning and at bedtime.   clopidogrel (PLAVIX) 75 MG tablet Take 75 mg by mouth in the morning.   Cyanocobalamin (VITAMIN B-12) 5000 MCG SUBL Place 5,000 mcg under the tongue 3 (three) times a week.   docusate sodium (COLACE) 100 MG capsule Take 100 mg by mouth in the morning.   felodipine (PLENDIL) 5 MG 24 hr tablet Take 5 mg by mouth in the  morning.   furosemide (LASIX) 20 MG tablet Take 1 tablet (20 mg total) by mouth daily. May take an extra tablet as needed for swelling   gabapentin (NEURONTIN) 300 MG capsule Take 300 mg by mouth 2 (two) times daily.   lamoTRIgine (LAMICTAL) 100 MG tablet Take 100 mg by mouth every morning.    levETIRAcetam (KEPPRA) 500 MG tablet Take 250 mg by mouth 2 (two) times daily.   linaclotide (LINZESS) 145 MCG CAPS capsule Take 145 mcg by mouth daily before breakfast.   LORazepam (ATIVAN) 0.5 MG tablet Take 0.5 mg by mouth daily as needed for anxiety.   metFORMIN (GLUCOPHAGE) 500 MG tablet Take 500 mg by mouth 2 (two) times daily.   metoprolol succinate (TOPROL-XL) 25 MG 24 hr tablet Take 1 tablet by mouth daily   Multiple Vitamins-Minerals (BARIATRIC MULTIVITAMINS/IRON PO) Take 1 tablet by mouth every evening.   olmesartan (BENICAR) 40 MG tablet Take 40 mg by mouth every morning.    OZEMPIC, 1 MG/DOSE, 2 MG/1.5ML SOPN Inject 1 mg into the skin every 30 (thirty) days.   pantoprazole (PROTONIX) 40 MG tablet Take 1 tablet (40 mg total) by mouth 2 (two) times daily.   pioglitazone (ACTOS) 30 MG tablet Take 30 mg by mouth every morning.   potassium chloride (KLOR-CON M) 10 MEQ tablet TAKE 2 TABLETS BY MOUTH DAILY.   rosuvastatin (CRESTOR) 40 MG tablet  Take 1 tablet (40 mg total) by mouth at bedtime.   No current facility-administered medications for this visit. (Other)   REVIEW OF SYSTEMS: ROS   Positive for: Gastrointestinal, Neurological, Endocrine, Cardiovascular, Eyes Negative for: Constitutional, Skin, Genitourinary, Musculoskeletal, HENT, Respiratory, Psychiatric, Allergic/Imm, Heme/Lymph Last edited by Laddie Aquas, COA on 04/10/2023  9:46 AM.      ALLERGIES Allergies  Allergen Reactions   Dilaudid [Hydromorphone Hcl] Nausea And Vomiting   Crab Extract Swelling    Crab Cakes   Crab [Shellfish Allergy] Swelling   Hydrocodone    Hydromorphone     Other Reaction(s): GI Intolerance    Other Other (See Comments)    "pt is a difficult IV stick, prefers IV team paged for IV starts"   PAST MEDICAL HISTORY Past Medical History:  Diagnosis Date   Anemia    Anxiety    Back pain    complains when laying on hard flat surface   Chronic kidney disease    appt. /w urology 11/10/2014- for a cyst seen on [place]. evaluated was told it was nothing.   Diabetes mellitus    Diabetes II, oral and insulin.   DVT (deep venous thrombosis) (HCC)    right leg '09 or '10   Fibroids    GERD (gastroesophageal reflux disease)    Heart murmur    History of kidney stones    History of stress test    referred for cardiac cath- done here at [facility]- 09/2014   Hypercholesterolemia    Hypertension    Neuromuscular disorder (HCC)    neuropathy   PONV (postoperative nausea and vomiting)    Seizures (HCC)    02/01/23- last seizure approximately 2015;   Sleep apnea    no longer uses a cpap   Stroke [facility])    '09-had blockage in brain, too deep to operate"occ left lower extremity fatique, occ. 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   Past Surgical History:  Procedure Laterality Date   APPENDECTOMY     BIOPSY  11/18/2021   Procedure: BIOPSY;  Surgeon: Dolores Frame, MD;  [facility];  Service: Gastroenterology;;  random colon;   BIOPSY  12/27/2021   Procedure: BIOPSY;  Surgeon: Dolores Frame, MD;  [facility];  Service: Gastroenterology;;   CARDIAC CATHETERIZATION     CESAREAN SECTION  1986 & 1988   x2   CHOLECYSTECTOMY     open"gallstones"   COLONOSCOPY WITH PROPOFOL N/A 11/18/2021   Procedure: COLONOSCOPY WITH PROPOFOL;  Surgeon: Dolores Frame, MD;  [facility]: [facility];  Service: Gastroenterology;  Laterality: N/A;  1215   DILATATION & CURETTAGE/HYSTEROSCOPY WITH MYOSURE N/A 05/10/2017   Procedure: DILATATION & CURETTAGE/HYSTEROSCOPY WITH MYOSURE;   Surgeon: Maxie Better, MD;  Location: [facility];  Service: Gynecology;  Laterality: N/A;   ESOPHAGOGASTRODUODENOSCOPY (EGD) WITH PROPOFOL N/A 12/27/2021   Procedure: ESOPHAGOGASTRODUODENOSCOPY (EGD) WITH PROPOFOL;  Surgeon: Dolores Frame, MD;  [facility];  Service: Gastroenterology;  Laterality: N/A;  100   ESOPHAGOGASTRODUODENOSCOPY (EGD) WITH PROPOFOL N/A 01/13/2022   Procedure: ESOPHAGOGASTRODUODENOSCOPY (EGD) WITH PROPOFOL;  Surgeon: Dolores Frame, MD;  [facility];  Service: Gastroenterology;  Laterality: N/A;  235, per Soledad Gerlach pt knows to arrive at 6:15   HEMOSTASIS CLIP PLACEMENT  01/13/2022   Procedure: HEMOSTASIS CLIP PLACEMENT;  Surgeon: Dolores Frame, MD;  [facility];  Service: Gastroenterology;;   INCISION AND DRAINAGE ABSCESS  02/23/2023   Procedure: INCISION AND DRAINAGE OF PERI RECTAL ABSCESS;  Surgeon: Andria Meuse, MD;  Location: [facility];  Service: General;;   INTERVENTIONAL RADIOLOGY PROCEDURE     CT angiography /neck -Dr. Corliss Skains.   IR ANGIO INTRA EXTRACRAN SEL COM CAROTID INNOMINATE BILAT MOD SED  02/22/2018   IR ANGIO INTRA EXTRACRAN SEL COM CAROTID INNOMINATE BILAT MOD SED  03/08/2022   IR ANGIO VERTEBRAL SEL VERTEBRAL BILAT MOD SED  02/22/2018   IR ANGIO VERTEBRAL SEL VERTEBRAL UNI R MOD SED  03/08/2022   IR GENERIC HISTORICAL  08/03/2016   IR RADIOLOGIST EVAL & MGMT 08/03/2016 [facility]   IR RADIOLOGIST EVAL & MGMT  01/03/2022   LAPAROSCOPIC GASTRIC SLEEVE RESECTION N/A 04/10/2016   Procedure: LAPAROSCOPIC GASTRIC SLEEVE RESECTION WITH UPPER ENDO;  Surgeon: Gaynelle Adu, MD;  Location: [facility];  Service: General;  Laterality: N/A;   LEFT HEART CATHETERIZATION WITH CORONARY ANGIOGRAM N/A 09/28/2014   Procedure: LEFT HEART CATHETERIZATION WITH CORONARY ANGIOGRAM;  Surgeon: Corky Crafts, MD;  Location: [facility] C[facility];  Service: Cardiovascular;  Laterality: N/A;   NEPHROLITHOTOMY  Right 02/01/2022   Procedure: RIGHT NEPHROLITHOTOMY PERCUTANEOUS WITH SURGEON ACCESS;  Surgeon: Crist Fat, MD;  Location: [facility];  Service: Urology;  Laterality: Right;   NEPHROLITHOTOMY Left 02/15/2022   Procedure: LEFT NEPHROLITHOTOMY PERCUTANEOUS WITH SURGEON ACCESS, REMOVAL OF RIGHT URETERAL STENT;  Surgeon: Crist Fat, MD;  Location: [facility];  Service: Urology;  Laterality: Left;  195 MINUTES   PLACEMENT OF SETON N/A 02/23/2023   Procedure: SURGICAL TREATMENT OF ANAL FISTULA-TRANSSPHINTERIL WITH PLACEMENT OF SETON;  Surgeon: Andria Meuse, MD;  Location: [facility];  Service: General;  Laterality: N/A;   POLYPECTOMY  01/13/2022   Procedure: POLYPECTOMY;  Surgeon: Dolores Frame, MD;  [facility];  Service: Gastroenterology;;   RADIOLOGY WITH ANESTHESIA N/A 10/14/2014   Procedure: Mingo Amber;  Surgeon: Julieanne Cotton, MD;  Location: [facility];  Service: Radiology;  Laterality: N/A;   TUBAL LIGATION     FAMILY HISTORY Family History  Problem Relation Age of Onset   Cataracts Mother    Kidney disease Mother        dialysis for 26 years   Heart attack Mother        4's   Diabetes Father    Cataracts Father    Heart failure Father    Heart disease Father    Heart attack Father        78's   Stroke Paternal Grandmother    SOCIAL HISTORY Social History   Tobacco Use   Smoking status: Every Day    Current packs/day: 0.25    Average packs/day: 0.3 packs/day for 47.7 years (11.9 ttl pk-yrs)    Types: Cigarettes    Start date: 07/25/1975    Passive exposure: Current   Smokeless tobacco: Never  Vaping Use   Vaping status: Never Used  Substance Use Topics   Alcohol use: Never   Drug use: No       OPHTHALMIC EXAM:  Base Eye Exam     Visual Acuity (Snellen - Linear)       Right Left   Dist cc 20/20 -2 20/20    Correction: Glasses         Tonometry (Tonopen, 9:45 AM)       Right Left   Pressure 18 13  Pupils       Dark Light Shape React APD   Right 2 1 Round Brisk None   Left 2 1 Round Brisk None         Visual Fields (Counting fingers)       Left Right    Full Full         Extraocular Movement       Right Left    Full, Ortho Full, Ortho         Neuro/Psych     Oriented x3: Yes   Mood/Affect: Normal         Dilation     Both eyes: 1.0% Mydriacyl, 2.5% Phenylephrine @ 9:45 AM           Slit Lamp and Fundus Exam     Slit Lamp Exam       Right Left   Lids/Lashes Dermatochalasis - upper lid Dermatochalasis - upper lid   Conjunctiva/Sclera mild melanosis mild melanosis   Cornea tear film debris tear film debris   Anterior Chamber deep and clear deep and clear   Iris Round and dilated, No NVI Round and dilated, No NVI   Lens 2+ Nuclear sclerosis, 2-3+ Cortical cataract 2+ Nuclear sclerosis, 2-3+ Cortical cataract, 2+ Posterior subcapsular cataract   Anterior Vitreous Vitreous syneresis, interval improvement in vitreous and subhyaloid heme, trace VH remnants settled inferiorly -- white in color Vitreous syneresis         Fundus Exam       Right Left   Disc Pink and Sharp, no NVD, +fibrosis Pink and Sharp, +cupping   C/D Ratio 0.5 0.65   Macula Flat, Good foveal reflex, RPE mottling and clumping, No heme or edema Flat, Good foveal reflex, RPE mottling and clumping, scattered MA/DBH -- improved, no frank edema   Vessels attenuated, Tortuous, early patches of NVE inferior midzone -- regressing attenuated, tortuous, early midzonal NVE   Periphery Attached, boat shaped subhyaloid heme inferior midzone increased and more diffuse Attached, scattered MA, white now boat shaped subhayloid / pre retinal heme inferior midzone -- improving, good PRP changes nasal hemisphere           Refraction     Wearing Rx       Sphere Cylinder Axis   Right -2.00 +0.50 178   Left -2.00 +0.75 014           IMAGING AND PROCEDURES  Imaging and Procedures for  04/10/2023  OCT, Retina - OU - Both Eyes       Right Eye Quality was good. Central Foveal Thickness: 208. Progression has improved. Findings include normal foveal contour, no IRF, no SRF, intraretinal hyper-reflective material (Partial PVD with mild interval improvement in vitreous opacities).   Left Eye Quality was good. Central Foveal Thickness: 215. Progression has improved. Findings include normal foveal contour, no IRF, no SRF, vitreomacular adhesion (No DME, Mild diffuse atrophy, interval improvement in pre-retinal hyper reflective material along inferior arcades -- seen best on widefield, interval improvement in vitreous / subhyaloid opacities).   Notes *Images captured and stored on drive  Diagnosis / Impression:  No DME OU OD: Partial PVD with mild interval improvement in vitreous opacities OS: No DME, Mild diffuse atrophy, interval improvement in pre-retinal hyper reflective material along inferior arcades -- seen best on widefield, interval improvement in vitreous / subhyaloid opacities  Clinical management:  See below  Abbreviations: NFP - Normal foveal profile. CME - cystoid macular edema. PED - pigment epithelial detachment. IRF -  intraretinal fluid. SRF - subretinal fluid. EZ - ellipsoid zone. ERM - epiretinal membrane. ORA - outer retinal atrophy. ORT - outer retinal tubulation. SRHM - subretinal hyper-reflective material. IRHM - intraretinal hyper-reflective material      Intravitreal Injection, Pharmacologic Agent - OD - Right Eye       Time Out 04/10/2023. 10:13 AM. Confirmed correct patient, procedure, site, and patient consented.   Anesthesia Topical anesthesia was used. Anesthetic medications included Lidocaine 2%, Proparacaine 0.5%.   Procedure Preparation included 5% betadine to ocular surface, eyelid speculum. A supplied needle was used.   Injection: 1.25 mg Bevacizumab 1.25mg /0.52ml   Route: Intravitreal, Site: Right Eye   NDC: P3213405, Lot:  [id number] , Expiration date: 05/04/2023   Post-op Post injection exam found visual acuity of at least counting fingers. The patient tolerated the procedure well. There were no complications. The patient received written and verbal post procedure care education.      Intravitreal Injection, Pharmacologic Agent - OS - Left Eye       Time Out 04/10/2023. 10:13 AM. Confirmed correct patient, procedure, site, and patient consented.   Anesthesia Topical anesthesia was used. Anesthetic medications included Lidocaine 2%, Proparacaine 0.5%.   Procedure Preparation included 5% betadine to ocular surface, eyelid speculum. A (32g) needle was used.   Injection: 1.25 mg Bevacizumab 1.25mg /0.16ml   Route: Intravitreal, Site: Left Eye   NDC: P3213405, Lot: [id number], Expiration date: 05/29/2023   Post-op Post injection exam found visual acuity of at least counting fingers. The patient tolerated the procedure well. There were no complications. The patient received written and verbal post procedure care education.           ASSESSMENT/PLAN:   ICD-10-CM   1. 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 Subhyaloid hemorrhage of both eyes  H35.63     3. Long term (current) use of oral hypoglycemic drugs  Z79.84     4. Long-term (current) use of injectable non-insulin antidiabetic drugs  Z79.85     5. Essential hypertension  I10     6. Hypertensive retinopathy of both eyes  H35.033     7. Combined forms of age-related cataract of both eyes  H25.813      1-4. Proliferative diabetic retinopathy w/o DME, OU  - s/p IVA OD #1 (05.23.24), #2 (06.24.24), #3 (06.24.24), #4  (08.20.24)  - s/p IVA OS #1 (06.24.24), #2 (06.24.24), #3 (08.20.24)  - s/p PRP OS (06.03.24) -- incomplete / aborted due to history of photogenic seizures  - OD with subhyaloid heme -- improved; OS with persistent subhyaloid heme today - FA (05.23.24) shows OD: Boat shaped blockage inferior midzone from subhyaloid heme, focal NVE and vascular non-perfusion IN midzone; OS: Scattered punctate NVE greatest inferior midzone -- pt needs PRP OU - repeat FA (07.23.24): OD: low fluorescein signal, focal NVE and vascular non-perfusion IN midzone -- improved; OS: Low fluorescein signal, punctate NVE greatest inferior midzone -- improved, new boat shaped heme inferior to disc - OCT shows OD: Partial PVD with mild interval improvement in vitreous opacities; OS: No DME, Mild diffuse atrophy, interval improvement in pre-retinal hyper reflective material along inferior arcades --  seen best on widefield, interval improvement in vitreous / subhyaloid opacities - recommend IVA OU (OD #5 and OS #4) today, 09.17.24 for PDR and subhyaloid hemes - pt wishes to proceed  - RBA of procedure discussed, questions answered - informed consent obtained and signed - see procedure note - discussed benefit of PRP laser OU and concern with history of photogenic seizures -- may need to have laser in OR under general anesthesia - working plan is to stabilize hemorrhages with anti-VEGF therapy and then potentially take pt to OR for PRP OU - f/u 4 weeks -- DFE/OCT, FA (transit OD), possible injxns  5,6. Hypertensive retinopathy OU - discussed importance of tight BP control - monitor  7. Mixed Cataract OU - The symptoms of cataract, surgical options, and treatments and risks were discussed with patient. - discussed diagnosis and progression - monitor  8. Hx of photogenic seizures  Ophthalmic Meds Ordered this visit:  Meds ordered this encounter  Medications   Bevacizumab (AVASTIN) SOLN 1.25 mg   Bevacizumab (AVASTIN) SOLN  1.25 mg     Return in about 4 weeks (around 05/08/2023) for f/u PDR OU, DFE, OCT, FA.  There are no Patient Instructions on file for this visit.  Explained the diagnoses, plan, and follow up with the patient and they expressed understanding.  Patient expressed understanding of the importance of proper follow up care.   This document serves as a record of services personally performed by Karie Chimera, MD, PhD. It was created on their behalf by Laurey Morale, COT an ophthalmic technician. The creation of this record is the provider's dictation and/or activities during the visit.    Electronically signed by:  Charlette Caffey, COT  04/10/23 10:36 PM  This document serves as a record of services personally performed by Karie Chimera, MD, PhD. It was created on their behalf by Glee Arvin. Manson Passey, OA an ophthalmic technician. The creation of this record is the provider's dictation and/or activities during the visit.    Electronically signed by: Glee Arvin. Manson Passey, OA 04/10/23 10:36 PM   Karie Chimera, M.D., Ph.D. Diseases & Surgery of the Retina and Vitreous [facility]  I have reviewed the above documentation for accuracy and completeness, and I agree with the above. Karie Chimera, M.D., Ph.D. 04/10/23 10:38 PM   Abbreviations: M myopia (nearsighted); A astigmatism; H hyperopia (farsighted); P presbyopia; Mrx spectacle prescription;  CTL contact lenses; OD right eye; OS left eye; OU both eyes  XT exotropia; ET esotropia; PEK punctate epithelial keratitis; PEE punctate epithelial erosions; DES dry eye syndrome; MGD meibomian gland dysfunction; ATs artificial tears; PFAT's preservative free artificial tears; NSC nuclear sclerotic cataract; PSC posterior subcapsular cataract; ERM epi-retinal membrane; PVD posterior vitreous detachment; RD retinal detachment; DM diabetes mellitus; DR diabetic retinopathy; NPDR non-proliferative diabetic retinopathy; PDR proliferative  diabetic retinopathy; CSME clinically significant macular edema; DME diabetic macular edema; dbh dot blot hemorrhages; CWS cotton wool spot; POAG primary open angle glaucoma; C/D cup-to-disc ratio; HVF humphrey visual field; GVF goldmann visual field; OCT optical coherence tomography; IOP intraocular pressure; BRVO Branch retinal vein occlusion; CRVO central retinal vein occlusion; CRAO central retinal artery occlusion; BRAO branch retinal artery occlusion; RT retinal tear; SB scleral buckle; PPV pars plana vitrectomy; VH Vitreous hemorrhage; PRP panretinal laser photocoagulation; IVK intravitreal kenalog; VMT vitreomacular traction; MH Macular hole;  NVD neovascularization of the disc; NVE neovascularization elsewhere; AREDS age related eye disease study; ARMD age related macular degeneration; POAG primary  open angle glaucoma; EBMD epithelial/anterior basement membrane dystrophy; ACIOL anterior chamber intraocular lens; IOL intraocular lens; PCIOL posterior chamber intraocular lens; Phaco/IOL phacoemulsification with intraocular lens placement; PRK photorefractive keratectomy; LASIK laser assisted in situ keratomileusis; HTN hypertension; DM diabetes mellitus; COPD chronic obstructive pulmonary disease

## 2023-03-29 ENCOUNTER — Encounter: Payer: Self-pay | Admitting: Pulmonary Disease

## 2023-03-29 ENCOUNTER — Ambulatory Visit (INDEPENDENT_AMBULATORY_CARE_PROVIDER_SITE_OTHER): Payer: BC Managed Care – PPO | Admitting: Pulmonary Disease

## 2023-03-29 VITALS — BP 150/82 | HR 83 | Ht 66.0 in | Wt 223.2 lb

## 2023-03-29 DIAGNOSIS — G4733 Obstructive sleep apnea (adult) (pediatric): Secondary | ICD-10-CM | POA: Diagnosis not present

## 2023-03-29 NOTE — Addendum Note (Signed)
Addended by: Winn Jock on: 03/29/2023 04:44 PM   Modules accepted: Orders

## 2023-03-29 NOTE — Assessment & Plan Note (Signed)
She is benefiting well from the machine.  CPAP download was reviewed which shows good control of events on auto settings 5 to 12 cm with average pressure of 10 and maximum pressure of 11 cm.  On some nights AHI goes up to 10/hour with suggest that home sleep test was an underestimation. We will change her to auto CPAP settings 8 to 12 cm.  She would like to trial nasal pillows in the future.  She has a mild leak is not symptomatic from this.  If this becomes an issue, chinstrap may be helpful. We discussed care of her machine.  CPAP has certainly helped improve her daytime somnolence and fatigue and she is very compliant.  CPAP supplies will be renewed for a year  Weight loss encouraged, compliance with goal of at least 4-6 hrs every night is the expectation. Advised against medications with sedative side effects Cautioned against driving when sleepy - understanding that sleepiness will vary on a day to day basis

## 2023-03-29 NOTE — Progress Notes (Signed)
   Subjective:    Patient ID: Jill [name], female    DOB: [birth year], 57 y.o.   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   PMH -CVA status post PCI for left carotid stenosis, peripheral arterial disease -HFpEF -Diarrhea being evaluated by GI, colonoscopy planned -Status post laparoscopic sleeve gastrectomy   OSA was diagnosed in 2012  After her last visit 11/2022 we initiated auto CPAP therapy since she was very symptomatic.  She started with a nasal cradle mask and has settled down very well.  She has noticed improvement in her daytime somnolence and fatigue.  She feels better rested in the daytime.  She is tolerating the mask and pressure very well.  She has several questions related to CPAP therapy today  Significant tests/ events reviewed 12/2021  HST showed very mild  OSA with AHI 6/ hr      NPSG 08/2022 mild , AHI 7/h , esp supine REM, low sat 85% - 212 lbs  Review of Systems neg for any significant sore throat, dysphagia, itching, sneezing, nasal congestion or excess/ purulent secretions, fever, chills, sweats, unintended wt loss, pleuritic or exertional cp, hempoptysis, orthopnea pnd or change in chronic leg swelling. Also denies presyncope, palpitations, heartburn, abdominal pain, nausea, vomiting, diarrhea or change in bowel or urinary habits, dysuria,hematuria, rash, arthralgias, visual complaints, headache, numbness weakness or ataxia.     Objective:   Physical Exam  Gen. Pleasant, obese, in no distress ENT - no lesions, no post nasal drip Neck: No JVD, no thyromegaly, no carotid bruits Lungs: no use of accessory muscles, no dullness to percussion, decreased without rales or rhonchi  Cardiovascular: Rhythm regular, heart sounds  normal, no murmurs or gallops, no peripheral edema Musculoskeletal: No deformities, no cyanosis or clubbing , no tremors       Assessment & Plan:

## 2023-03-29 NOTE — Patient Instructions (Signed)
X Change to auto CPAP 8-12 cm  OK to trial nasal pillows

## 2023-04-10 ENCOUNTER — Ambulatory Visit (INDEPENDENT_AMBULATORY_CARE_PROVIDER_SITE_OTHER): Payer: BC Managed Care – PPO | Admitting: Ophthalmology

## 2023-04-10 ENCOUNTER — Encounter (INDEPENDENT_AMBULATORY_CARE_PROVIDER_SITE_OTHER): Payer: Self-pay | Admitting: Ophthalmology

## 2023-04-10 DIAGNOSIS — Z7984 Long term (current) use of oral hypoglycemic drugs: Secondary | ICD-10-CM | POA: Diagnosis not present

## 2023-04-10 DIAGNOSIS — I10 Essential (primary) hypertension: Secondary | ICD-10-CM

## 2023-04-10 DIAGNOSIS — Z7985 Long-term (current) use of injectable non-insulin antidiabetic drugs: Secondary | ICD-10-CM

## 2023-04-10 DIAGNOSIS — E113593 Type 2 diabetes mellitus with proliferative diabetic retinopathy without macular edema, bilateral: Secondary | ICD-10-CM

## 2023-04-10 DIAGNOSIS — H3563 Retinal hemorrhage, bilateral: Secondary | ICD-10-CM | POA: Diagnosis not present

## 2023-04-10 DIAGNOSIS — H35033 Hypertensive retinopathy, bilateral: Secondary | ICD-10-CM

## 2023-04-10 DIAGNOSIS — H25813 Combined forms of age-related cataract, bilateral: Secondary | ICD-10-CM

## 2023-04-10 MED ORDER — BEVACIZUMAB CHEMO INJECTION 1.25MG/0.05ML SYRINGE FOR KALEIDOSCOPE
1.2500 mg | INTRAVITREAL | Status: AC | PRN
Start: 2023-04-10 — End: 2023-04-10
  Administered 2023-04-10: 1.25 mg via INTRAVITREAL

## 2023-04-24 ENCOUNTER — Encounter: Payer: Self-pay | Admitting: Cardiology

## 2023-04-24 ENCOUNTER — Ambulatory Visit: Payer: BC Managed Care – PPO | Attending: Cardiology | Admitting: Cardiology

## 2023-04-24 VITALS — BP 122/58 | HR 81 | Ht 66.5 in | Wt 226.0 lb

## 2023-04-24 DIAGNOSIS — I5032 Chronic diastolic (congestive) heart failure: Secondary | ICD-10-CM | POA: Diagnosis not present

## 2023-04-24 DIAGNOSIS — I10 Essential (primary) hypertension: Secondary | ICD-10-CM | POA: Diagnosis not present

## 2023-04-24 DIAGNOSIS — R42 Dizziness and giddiness: Secondary | ICD-10-CM | POA: Diagnosis not present

## 2023-04-24 DIAGNOSIS — I2510 Atherosclerotic heart disease of native coronary artery without angina pectoris: Secondary | ICD-10-CM | POA: Diagnosis not present

## 2023-04-24 MED ORDER — FUROSEMIDE 20 MG PO TABS: 20.0000 mg | ORAL_TABLET | ORAL | 3 refills | Status: DC

## 2023-04-24 MED ORDER — EZETIMIBE 10 MG PO TABS: 10.0000 mg | ORAL_TABLET | Freq: Every day | ORAL | 3 refills | Status: DC

## 2023-04-24 NOTE — Patient Instructions (Signed)
Medication Instructions:  Your physician has recommended you make the following change in your medication:   -Change Lasix to 20 mg tablet once every other day.   *If you need a refill on your cardiac medications before your next appointment, please call your pharmacy*   Lab Work: None If you have labs (blood work) drawn today and your tests are completely normal, you will receive your results only by: MyChart Message (if you have MyChart) OR A paper copy in the mail If you have any lab test that is abnormal or we need to change your treatment, we will call you to review the results.   Testing/Procedures: none   Follow-Up: At [facility], you and your health needs are our priority.  As part of our continuing mission to provide you with exceptional heart care, we have created designated Provider Care Teams.  These Care Teams include your primary Cardiologist (physician) and Advanced Practice Providers (APPs -  Physician Assistants and Nurse Practitioners) who all work together to provide you with the care you need, when you need it.  We recommend signing up for the patient portal called "MyChart".  Sign up information is provided on this After Visit Summary.  MyChart is used to connect with patients for Virtual Visits (Telemedicine).  Patients are able to view lab/test results, encounter notes, upcoming appointments, etc.  Non-urgent messages can be sent to your provider as well.   To learn more about what you can do with MyChart, go to [email or web address].    Your next appointment:   6 month(s)  Provider:   You may see Dina Rich, MD or one of the following Advanced Practice Providers on your designated Care Team:   Randall An, PA-C  Jacolyn Reedy, [place]     Other Instructions

## 2023-04-24 NOTE — Progress Notes (Signed)
[facility] - Clinic Note  05/08/2023   CHIEF COMPLAINT Patient presents for Retina Follow Up  HISTORY OF PRESENT ILLNESS: Jill [name] is a 57 y.o. female who presents to the clinic today for:  HPI     Retina Follow Up   Patient presents with  Diabetic Retinopathy.  In both eyes.  This started weeks ago.  Duration of 4 weeks.  I, the attending physician,  performed the HPI with the patient and updated documentation appropriately.        Comments   Patient feels the vision is the same. She is not using eye drops. Her blood sugar was 103 yesterday.       Last edited by Rennis Chris, MD on 05/08/2023 11:58 PM.      Referring physician: Pecolia Ades, MD [address] [place],  [place] [address]  HISTORICAL INFORMATION:  Selected notes from the MEDICAL RECORD NUMBER Referred by Dr. Ilsa Iha for diabetic retinopathy eval -- new subhyaloid heme OD LEE:  Ocular Hx- PMH-   CURRENT MEDICATIONS: No current outpatient medications on file. (Ophthalmic Drugs)   No current facility-administered medications for this visit. (Ophthalmic Drugs)   Current Outpatient Medications (Other)  Medication Sig   acetaminophen (TYLENOL) 500 MG tablet Take 500-1,000 mg by mouth every 8 (eight) hours as needed for moderate pain or headache.    aspirin EC 81 MG tablet Take 81 mg by mouth every morning.   bismuth subsalicylate (PEPTO BISMOL) 262 MG chewable tablet Chew 524 mg by mouth 3 (three) times daily as needed (stomach pain.).   Calcium-Phosphorus-Vitamin D (CITRACAL +D3 PO) Take 2 tablets by mouth in the morning and at bedtime.   clopidogrel (PLAVIX) 75 MG tablet Take 75 mg by mouth in the morning.   Cyanocobalamin (VITAMIN B-12) 5000 MCG SUBL Place 5,000 mcg under the tongue 3 (three) times a week.   docusate sodium (COLACE) 100 MG capsule Take 100 mg by mouth in the morning.   ezetimibe (ZETIA) 10 MG tablet Take 1 tablet (10 mg total) by mouth daily.   felodipine  (PLENDIL) 5 MG 24 hr tablet Take 5 mg by mouth in the morning.   furosemide (LASIX) 20 MG tablet Take 1 tablet (20 mg total) by mouth every other day.   gabapentin (NEURONTIN) 300 MG capsule Take 300 mg by mouth 2 (two) times daily.   lamoTRIgine (LAMICTAL) 100 MG tablet Take 100 mg by mouth every morning.    levETIRAcetam (KEPPRA) 500 MG tablet Take 250 mg by mouth 2 (two) times daily.   linaclotide (LINZESS) 145 MCG CAPS capsule Take 145 mcg by mouth daily before breakfast.   LORazepam (ATIVAN) 0.5 MG tablet Take 0.5 mg by mouth daily as needed for anxiety.   metFORMIN (GLUCOPHAGE) 500 MG tablet Take 500 mg by mouth 2 (two) times daily.   metoprolol succinate (TOPROL-XL) 25 MG 24 hr tablet Take 1 tablet by mouth daily   Multiple Vitamins-Minerals (BARIATRIC MULTIVITAMINS/IRON PO) Take 1 tablet by mouth every evening.   olmesartan (BENICAR) 40 MG tablet Take 40 mg by mouth every morning.    OZEMPIC, 1 MG/DOSE, 2 MG/1.5ML SOPN Inject 1 mg into the skin every 30 (thirty) days.   pantoprazole (PROTONIX) 40 MG tablet Take 1 tablet (40 mg total) by mouth 2 (two) times daily.   pioglitazone (ACTOS) 30 MG tablet Take 30 mg by mouth every morning.   potassium chloride (KLOR-CON M) 10 MEQ tablet TAKE 2 TABLETS BY MOUTH DAILY.  rosuvastatin (CRESTOR) 40 MG tablet Take 1 tablet (40 mg total) by mouth at bedtime.   No current facility-administered medications for this visit. (Other)   REVIEW OF SYSTEMS: ROS   Positive for: Gastrointestinal, Neurological, Endocrine, Cardiovascular, Eyes Negative for: Constitutional, Skin, Genitourinary, Musculoskeletal, HENT, Respiratory, Psychiatric, Allergic/Imm, Heme/Lymph Last edited by Charlette Caffey, COT on 05/08/2023  9:06 AM.       ALLERGIES Allergies  Allergen Reactions   Dilaudid [Hydromorphone Hcl] Nausea And Vomiting   Crab Extract Swelling    Crab Cakes   Crab [Shellfish Allergy] Swelling   Hydrocodone    Hydromorphone     Other  Reaction(s): GI Intolerance   Other Other (See Comments)    "pt is a difficult IV stick, prefers IV team paged for IV starts"   PAST MEDICAL HISTORY Past Medical History:  Diagnosis Date   Anemia    Anxiety    Back pain    complains when laying on hard flat surface   Chronic kidney disease    appt. /w urology 11/10/2014- for a cyst seen on [place]. evaluated was told it was nothing.   Diabetes mellitus    Diabetes II, oral and insulin.   DVT (deep venous thrombosis) (HCC)    right leg '09 or '10   Fibroids    GERD (gastroesophageal reflux disease)    Heart murmur    History of kidney stones    History of stress test    referred for cardiac cath- done here at [facility]- 09/2014   Hypercholesterolemia    Hypertension    Neuromuscular disorder (HCC)    neuropathy   PONV (postoperative nausea and vomiting)    Seizures (HCC)    02/01/23- last seizure approximately 2015;   Sleep apnea    no longer uses a cpap   Stroke [facility])    '09-had blockage in brain, too deep to operate"occ left lower extremity fatique, occ. 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   Past Surgical History:  Procedure Laterality Date   APPENDECTOMY     BIOPSY  11/18/2021   Procedure: BIOPSY;  Surgeon: Dolores Frame, MD;  [facility];  Service: Gastroenterology;;  random colon;   BIOPSY  12/27/2021   Procedure: BIOPSY;  Surgeon: Dolores Frame, MD;  [facility];  Service: Gastroenterology;;   CARDIAC CATHETERIZATION     CESAREAN SECTION  1986 & 1988   x2   CHOLECYSTECTOMY     open"gallstones"   COLONOSCOPY WITH PROPOFOL N/A 11/18/2021   Procedure: COLONOSCOPY WITH PROPOFOL;  Surgeon: Dolores Frame, MD;  [facility];  Service: Gastroenterology;  Laterality: N/A;  1215   DILATATION & CURETTAGE/HYSTEROSCOPY WITH MYOSURE N/A 05/10/2017   Procedure: DILATATION &  CURETTAGE/HYSTEROSCOPY WITH MYOSURE;  Surgeon: Maxie Better, MD;  Location: [facility];  Service: Gynecology;  Laterality: N/A;   ESOPHAGOGASTRODUODENOSCOPY (EGD) WITH PROPOFOL N/A 12/27/2021   Procedure: ESOPHAGOGASTRODUODENOSCOPY (EGD) WITH PROPOFOL;  Surgeon: Dolores Frame, MD;  [facility];  Service: Gastroenterology;  Laterality: N/A;  100   ESOPHAGOGASTRODUODENOSCOPY (EGD) WITH PROPOFOL N/A 01/13/2022   Procedure: ESOPHAGOGASTRODUODENOSCOPY (EGD) WITH PROPOFOL;  Surgeon: Dolores Frame, MD;  [facility];  Service: Gastroenterology;  Laterality: N/A;  235, per Soledad Gerlach pt knows to arrive at 6:15   HEMOSTASIS CLIP PLACEMENT  01/13/2022   Procedure: HEMOSTASIS CLIP PLACEMENT;  Surgeon: Marguerita Merles,  Reuel Boom, MD;  [facility]: [facility];  Service: Gastroenterology;;   INCISION AND DRAINAGE ABSCESS  02/23/2023   Procedure: INCISION AND DRAINAGE OF PERI RECTAL ABSCESS;  Surgeon: Andria Meuse, MD;  [facility] ;  Service: General;;   INTERVENTIONAL RADIOLOGY PROCEDURE     CT angiography /neck -Dr. Corliss Skains.   IR ANGIO INTRA EXTRACRAN SEL COM CAROTID INNOMINATE BILAT MOD SED  02/22/2018   IR ANGIO INTRA EXTRACRAN SEL COM CAROTID INNOMINATE BILAT MOD SED  03/08/2022   IR ANGIO VERTEBRAL SEL VERTEBRAL BILAT MOD SED  02/22/2018   IR ANGIO VERTEBRAL SEL VERTEBRAL UNI R MOD SED  03/08/2022   IR GENERIC HISTORICAL  08/03/2016   IR RADIOLOGIST EVAL & MGMT 08/03/2016 [facility]-INTER[facility] RAD   IR RADIOLOGIST EVAL & MGMT  01/03/2022   LAPAROSCOPIC GASTRIC SLEEVE RESECTION N/A 04/10/2016   Procedure: LAPAROSCOPIC GASTRIC SLEEVE RESECTION WITH UPPER ENDO;  Surgeon: Gaynelle Adu, MD;  Location: [facility];  Service: General;  Laterality: N/A;   LEFT HEART CATHETERIZATION WITH CORONARY ANGIOGRAM N/A 09/28/2014   Procedure: LEFT HEART CATHETERIZATION WITH CORONARY ANGIOGRAM;  Surgeon: Corky Crafts, MD;  Location: [facility] CATH LAB;  Service:  Cardiovascular;  Laterality: N/A;   NEPHROLITHOTOMY Right 02/01/2022   Procedure: RIGHT NEPHROLITHOTOMY PERCUTANEOUS WITH SURGEON ACCESS;  Surgeon: Crist Fat, MD;  Location: [facility];  Service: Urology;  Laterality: Right;   NEPHROLITHOTOMY Left 02/15/2022   Procedure: LEFT NEPHROLITHOTOMY PERCUTANEOUS WITH SURGEON ACCESS, REMOVAL OF RIGHT URETERAL STENT;  Surgeon: Crist Fat, MD;  Location: [facility];  Service: Urology;  Laterality: Left;  195 MINUTES   PLACEMENT OF SETON N/A 02/23/2023   Procedure: SURGICAL TREATMENT OF ANAL FISTULA-TRANSSPHINTERIL WITH PLACEMENT OF SETON;  Surgeon: Andria Meuse, MD;  Location: [facility];  Service: General;  Laterality: N/A;   POLYPECTOMY  01/13/2022   Procedure: POLYPECTOMY;  Surgeon: Dolores Frame, MD;  [facility];  Service: Gastroenterology;;   RADIOLOGY WITH ANESTHESIA N/A 10/14/2014   Procedure: Mingo Amber;  Surgeon: Julieanne Cotton, MD;  Location: [facility];  Service: Radiology;  Laterality: N/A;   TUBAL LIGATION     FAMILY HISTORY Family History  Problem Relation Age of Onset   Cataracts Mother    Kidney disease Mother        dialysis for 26 years   Heart attack Mother        97's   Diabetes Father    Cataracts Father    Heart failure Father    Heart disease Father    Heart attack Father        63's   Stroke Paternal Grandmother    SOCIAL HISTORY Social History   Tobacco Use   Smoking status: Every Day    Current packs/day: 0.25    Average packs/day: 0.3 packs/day for 47.8 years (11.9 ttl pk-yrs)    Types: Cigarettes    Start date: 07/25/1975    Passive exposure: Current   Smokeless tobacco: Never  Vaping Use   Vaping status: Never Used  Substance Use Topics   Alcohol use: Never   Drug use: No       OPHTHALMIC EXAM:  Base Eye Exam     Visual Acuity (Snellen - Linear)       Right Left   Dist cc 20/20 20/30   Dist ph cc  20/25 +2    Correction: Glasses          Tonometry (Tonopen, 9:12 AM)  Right Left   Pressure 14 16         Pupils       Dark Light Shape React APD   Right 2 1 Round Brisk None   Left 2 1 Round Brisk None         Visual Fields       Left Right    Full Full         Extraocular Movement       Right Left    Full, Ortho Full, Ortho         Neuro/Psych     Oriented x3: Yes   Mood/Affect: Normal         Dilation     Both eyes: 1.0% Mydriacyl, 2.5% Phenylephrine @ 9:07 AM           Slit Lamp and Fundus Exam     Slit Lamp Exam       Right Left   Lids/Lashes Dermatochalasis - upper lid Dermatochalasis - upper lid   Conjunctiva/Sclera mild melanosis mild melanosis   Cornea tear film debris tear film debris   Anterior Chamber deep and clear deep and clear   Iris Round and dilated, No NVI Round and dilated, No NVI   Lens 2+ Nuclear sclerosis, 2-3+ Cortical cataract 2+ Nuclear sclerosis, 2-3+ Cortical cataract, 2+ Posterior subcapsular cataract   Anterior Vitreous Vitreous syneresis, interval improvement in vitreous and subhyaloid heme, trace VH remnants settled inferiorly -- white in color Vitreous syneresis         Fundus Exam       Right Left   Disc Pink and Sharp, no NVD, +fibrosis Pink and Sharp, +cupping   C/D Ratio 0.5 0.65   Macula Flat, Good foveal reflex, RPE mottling and clumping, No heme or edema Flat, Good foveal reflex, RPE mottling and clumping, scattered MA/DBH -- improved, no frank edema   Vessels attenuated, Tortuous, early patches of NVE inferior midzone -- regressed attenuated, tortuous, early midzonal NVE   Periphery Attached, boat shaped subhyaloid heme inferior midzone increased and more diffuse Attached, scattered MA, white now boat shaped subhayloid / pre retinal heme inferior midzone -- improving, good PRP changes nasal hemisphere           Refraction     Wearing Rx       Sphere Cylinder Axis   Right -2.00 +0.50 178   Left -2.00 +0.75 014            IMAGING AND PROCEDURES  Imaging and Procedures for 05/08/2023  OCT, Retina - OU - Both Eyes       Right Eye Quality was good. Central Foveal Thickness: 206. Progression has improved. Findings include normal foveal contour, no IRF, no SRF, intraretinal hyper-reflective material (Partial PVD with mild interval improvement in vitreous opacities).   Left Eye Quality was good. Central Foveal Thickness: 217. Progression has improved. Findings include normal foveal contour, no IRF, no SRF, vitreomacular adhesion (No DME, Mild diffuse atrophy, stable improvement in pre-retinal hyper reflective material along inferior arcades, interval improvement in vitreous / subhyaloid opacities).   Notes *Images captured and stored on drive  Diagnosis / Impression:  No DME OU OD: Partial PVD with mild interval improvement in vitreous opacities OS: No DME, Mild diffuse atrophy, stable improvement in pre-retinal hyper reflective material along inferior arcades, interval improvement in vitreous / subhyaloid opacities  Clinical management:  See below  Abbreviations: NFP - Normal foveal profile. CME - cystoid macular edema. PED - pigment epithelial  detachment. IRF - intraretinal fluid. SRF - subretinal fluid. EZ - ellipsoid zone. ERM - epiretinal membrane. ORA - outer retinal atrophy. ORT - outer retinal tubulation. SRHM - subretinal hyper-reflective material. IRHM - intraretinal hyper-reflective material      Fluorescein Angiography Optos (Transit OD)       Right Eye Progression has improved. Early phase findings include delayed filling, microaneurysm, vascular perfusion defect (Delayed filling, focal NVE and vascular non-perfusion IN midzone -- improved). Mid/Late phase findings include microaneurysm, vascular perfusion defect (focal NVE and vascular non-perfusion IN midzone -- improved, no leakage).   Left Eye Progression has improved. Early phase findings include blockage, staining, microaneurysm  (Interval improvement in NV-- no NVE). Mid/Late phase findings include blockage, staining, microaneurysm, vascular perfusion defect (punctate NVE greatest inferior midzone -- resolved, boat shaped heme inferior to disc- resolved).   Notes **Images stored on drive**  Impression: PDR OU (OD>OS) OD: focal NVE and vascular non-perfusion IN midzone -- improved w/ regression of NV, no leakage OS: punctate NVE greatest inferior midzone -- resolved, boat shaped heme inferior to disc- resolved      Intravitreal Injection, Pharmacologic Agent - OD - Right Eye       Time Out 05/08/2023. 10:25 AM. Confirmed correct patient, procedure, site, and patient consented.   Anesthesia Topical anesthesia was used. Anesthetic medications included Lidocaine 2%, Proparacaine 0.5%.   Procedure Preparation included 5% betadine to ocular surface, eyelid speculum. A supplied needle was used.   Injection: 1.25 mg Bevacizumab 1.25mg /0.41ml   Route: Intravitreal, Site: Right Eye   NDC: P3213405, Lot: [id number], Expiration date: 06/30/2023   Post-op Post injection exam found visual acuity of at least counting fingers. The patient tolerated the procedure well. There were no complications. The patient received written and verbal post procedure care education.      Intravitreal Injection, Pharmacologic Agent - OS - Left Eye       Time Out 05/08/2023. 10:25 AM. Confirmed correct patient, procedure, site, and patient consented.   Anesthesia Topical anesthesia was used. Anesthetic medications included Lidocaine 2%, Proparacaine 0.5%.   Procedure Preparation included 5% betadine to ocular surface, eyelid speculum. A (32g) needle was used.   Injection: 1.25 mg Bevacizumab 1.25mg /0.47ml   Route: Intravitreal, Site: Left Eye   NDC: P3213405, Lot: [id number], Expiration date: 06/22/2023   Post-op Post injection exam found visual acuity of at least counting fingers. The patient tolerated the procedure  well. There were no complications. The patient received written and verbal post procedure care education.           ASSESSMENT/PLAN:   ICD-10-CM   1. 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 Subhyaloid hemorrhage of both eyes  H35.63     3. Long term (current) use of oral hypoglycemic drugs  Z79.84     4. Long-term (current) use of injectable non-insulin antidiabetic drugs  Z79.85     5. Essential hypertension  I10     6. Hypertensive retinopathy of both eyes  H35.033     7. Combined forms of age-related cataract of both eyes  H25.813  1-4. Proliferative diabetic retinopathy w/o DME, OU - s/p IVA OD #1 (05.23.24), #2 (06.24.24), #3 (06.24.24), #4 (08.20.24), #5 (09.17.24)  - s/p IVA OS #1 (06.24.24), #2 (06.24.24), #3 (08.20.24), #4 (09.17.24) - s/p PRP OS (06.03.24) -- incomplete / aborted due to history of photogenic seizures  - OD with subhyaloid heme -- improved; OS with persistent subhyaloid heme today - Repeat FA today (10.15.24) shows [id number] NVE and vascular non-perfusion IN midzone -- improved, no leakage, OS: punctate NVE greatest inferior midzone -- resolved, boat shaped heme inferior to disc- resolved - Repeat FA (05.23.24) shows OD: Boat shaped blockage inferior midzone from subhyaloid heme, focal NVE and vascular non-perfusion IN midzone; OS: Scattered punctate NVE greatest inferior midzone -- pt needs PRP OU - repeat FA (07.23.24): OD: low fluorescein signal, focal NVE and vascular non-perfusion IN midzone -- improved; OS: Low fluorescein signal, punctate NVE greatest inferior midzone -- improved,  new boat shaped heme inferior to disc - repeat FA (10.15.24) OD: focal NVE and vascular non-perfusion IN midzone -- improved w/ regression of NV, no leakage; OS: punctate NVE greatest inferior midzone -- resolved, boat shaped heme inferior to disc- resolved - OCT shows OD: Partial PVD with mild interval improvement in vitreous opacities OS: No DME, Mild diffuse atrophy, stable improvement in pre-retinal hyper reflective material along inferior arcades, interval improvement in vitreous / subhyaloid opacities at 4 wks - recommend IVA OU (OD #6 and OS #5) today, 10.15.24 for PDR and subhyaloid hemes w/ ext f/u to 6 wks - pt wishes to proceed  - RBA of procedure discussed, questions answered - informed consent obtained and signed 05.23.24 (OU) - see procedure note - f/u 4 weeks -- DFE/OCT, possible injxns  5,6. Hypertensive retinopathy OU - discussed importance of tight BP control - monitor  7. Mixed Cataract OU - The symptoms of cataract, surgical options, and treatments and risks were discussed with patient. - discussed diagnosis and progression - monitor  8. Hx of photogenic seizures  Ophthalmic Meds Ordered this visit:  Meds ordered this encounter  Medications   Bevacizumab (AVASTIN) SOLN 1.25 mg   Bevacizumab (AVASTIN) SOLN 1.25 mg     Return in about 6 weeks (around 06/19/2023) for f/u PDR OU, DFE, OCT, Possible, IVA, OU.  There are no Patient Instructions on file for this visit.  Explained the diagnoses, plan, and follow up with the patient and they expressed understanding.  Patient expressed understanding of the importance of proper follow up care.   This document serves as a record of services personally performed by Karie Chimera, MD, PhD. It was created on their behalf by Laurey Morale, COT an ophthalmic technician. The creation of this record is the provider's dictation and/or activities during the visit.    Electronically signed by:  Charlette Caffey, COT   05/08/23 11:58 PM  Karie Chimera, M.D., Ph.D. Diseases & Surgery of the Retina and Vitreous [facility]  I have reviewed the above documentation for accuracy and completeness, and I agree with the above. Karie Chimera, M.D., Ph.D. 05/09/23 12:07 AM  Abbreviations: M myopia (nearsighted); A astigmatism; H hyperopia (farsighted); P presbyopia; Mrx spectacle prescription;  CTL contact lenses; OD right eye; OS left eye; OU both eyes  XT exotropia; ET esotropia; PEK punctate epithelial keratitis; PEE punctate epithelial erosions; DES dry eye syndrome; MGD meibomian gland dysfunction; ATs artificial tears; PFAT's preservative free artificial tears; NSC nuclear sclerotic cataract; PSC posterior subcapsular cataract; ERM epi-retinal membrane; PVD  posterior vitreous detachment; RD retinal detachment; DM diabetes mellitus; DR diabetic retinopathy; NPDR non-proliferative diabetic retinopathy; PDR proliferative diabetic retinopathy; CSME clinically significant macular edema; DME diabetic macular edema; dbh dot blot hemorrhages; CWS cotton wool spot; POAG primary open angle glaucoma; C/D cup-to-disc ratio; HVF humphrey visual field; GVF goldmann visual field; OCT optical coherence tomography; IOP intraocular pressure; BRVO Branch retinal vein occlusion; CRVO central retinal vein occlusion; CRAO central retinal artery occlusion; BRAO branch retinal artery occlusion; RT retinal tear; SB scleral buckle; PPV pars plana vitrectomy; VH Vitreous hemorrhage; PRP panretinal laser photocoagulation; IVK intravitreal kenalog; VMT vitreomacular traction; MH Macular hole;  NVD neovascularization of the disc; NVE neovascularization elsewhere; AREDS age related eye disease study; ARMD age related macular degeneration; POAG primary open angle glaucoma; EBMD epithelial/anterior basement membrane dystrophy; ACIOL anterior chamber intraocular lens; IOL intraocular lens; PCIOL posterior chamber intraocular lens;  Phaco/IOL phacoemulsification with intraocular lens placement; PRK photorefractive keratectomy; LASIK laser assisted in situ keratomileusis; HTN hypertension; DM diabetes mellitus; COPD chronic obstructive pulmonary disease

## 2023-04-24 NOTE — Addendum Note (Signed)
Addended by: Leonides Schanz C on: 04/24/2023 03:22 PM   Modules accepted: Orders

## 2023-04-24 NOTE — Progress Notes (Signed)
Clinical Summary Jill [name] is a 57 y.o.female seen today for follo wup of the following medical problems.        1. Orthostatic dizziness/syncope -previously found to be orthostatic in clinic - some episodes of lightheadedness with standing but no recurrent syncope. We had lowered her lasix to 20mg  daily last visit, improvement but symtoms not resolved.      2. Obesity - history of bariatric surgery,     3. Abnormal stress test/Chest pain/CAD - as part of a bariatric surgery preop workup she had a nuclear stress that was abnormal - follow up cath 09/2014 showed mild to moderate non-obstructive disease.    -- no recent chest pains.      4. Hyperlipidemia    11/2019 TC 166 TG 178 HDL 49 LDL 81 - more recent labs with pcp - she is on crestor 40mg  daily  Jan 2024 TC 158 TG 124 HDL 47 LDL 89 - request if more recent panel   5. HTN   -she is compliant wth meds   6. PAD - prior right lower intervention with PTA and stenting of right common iliac and right SFA in Jan 2010 -  no recent symptoms.    7. OSA - remains compliant with CPAP - followed by Dr Vassie Loll - 08/2022 sleep study mild OSA     8. Carotid stenosis - followed by vascular. History of left ICA angioplasty 09/2014 for which she remains on ASA and plavix for.  - from 09/2020 telephone neuro interventional Dr Corliss Skains had recommended lifelong DAPT.     9. Chronic diastolic HF - no recent symptoms. - lasix lowered last visit to 20mg  daily due to orthostatic symptoms     Past Medical History:  Diagnosis Date   Anemia    Anxiety    Back pain    complains when laying on hard flat surface   Chronic kidney disease    appt. /w urology 11/10/2014- for a cyst seen on [place]. evaluated was told it was nothing.   Diabetes mellitus    Diabetes II, oral and insulin.   DVT (deep venous thrombosis) (HCC)    right leg '09 or '10   Fibroids    GERD (gastroesophageal reflux disease)    Heart murmur    History of  kidney stones    History of stress test    referred for cardiac cath- done here at [facility]- 09/2014   Hypercholesterolemia    Hypertension    Neuromuscular disorder (HCC)    neuropathy   PONV (postoperative nausea and vomiting)    Seizures (HCC)    02/01/23- last seizure approximately 2015;   Sleep apnea    no longer uses a cpap   Stroke [facility])    '09-had blockage in brain, too deep to operate"occ left lower extremity fatique, occ. 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     Allergies  Allergen Reactions   Dilaudid [Hydromorphone Hcl] Nausea And Vomiting   Crab Extract Swelling    Crab Cakes   Crab [Shellfish Allergy] Swelling   Hydrocodone    Hydromorphone     Other Reaction(s): GI Intolerance   Other Other (See Comments)    "pt is a difficult IV stick, prefers IV team paged for IV starts"     Current Outpatient Medications  Medication Sig Dispense Refill   acetaminophen (TYLENOL) 500 MG tablet Take  500-1,000 mg by mouth every 8 (eight) hours as needed for moderate pain or headache.      aspirin EC 81 MG tablet Take 81 mg by mouth every morning.     bismuth subsalicylate (PEPTO BISMOL) 262 MG chewable tablet Chew 524 mg by mouth 3 (three) times daily as needed (stomach pain.).     Calcium-Phosphorus-Vitamin D (CITRACAL +D3 PO) Take 2 tablets by mouth in the morning and at bedtime.     clopidogrel (PLAVIX) 75 MG tablet Take 75 mg by mouth in the morning.     Cyanocobalamin (VITAMIN B-12) 5000 MCG SUBL Place 5,000 mcg under the tongue 3 (three) times a week.     docusate sodium (COLACE) 100 MG capsule Take 100 mg by mouth in the morning.     felodipine (PLENDIL) 5 MG 24 hr tablet Take 5 mg by mouth in the morning.     furosemide (LASIX) 20 MG tablet Take 1 tablet (20 mg total) by mouth daily. May take an extra tablet as needed for swelling 90 tablet 3   gabapentin (NEURONTIN) 300 MG capsule Take  300 mg by mouth 2 (two) times daily.  0   lamoTRIgine (LAMICTAL) 100 MG tablet Take 100 mg by mouth every morning.      levETIRAcetam (KEPPRA) 500 MG tablet Take 250 mg by mouth 2 (two) times daily.     linaclotide (LINZESS) 145 MCG CAPS capsule Take 145 mcg by mouth daily before breakfast.     LORazepam (ATIVAN) 0.5 MG tablet Take 0.5 mg by mouth daily as needed for anxiety.     metFORMIN (GLUCOPHAGE) 500 MG tablet Take 500 mg by mouth 2 (two) times daily.     metoprolol succinate (TOPROL-XL) 25 MG 24 hr tablet Take 1 tablet by mouth daily 90 tablet 2   Multiple Vitamins-Minerals (BARIATRIC MULTIVITAMINS/IRON PO) Take 1 tablet by mouth every evening.     olmesartan (BENICAR) 40 MG tablet Take 40 mg by mouth every morning.      OZEMPIC, 1 MG/DOSE, 2 MG/1.5ML SOPN Inject 1 mg into the skin every 30 (thirty) days.     pantoprazole (PROTONIX) 40 MG tablet Take 1 tablet (40 mg total) by mouth 2 (two) times daily. 60 tablet 2   pioglitazone (ACTOS) 30 MG tablet Take 30 mg by mouth every morning.     potassium chloride (KLOR-CON M) 10 MEQ tablet TAKE 2 TABLETS BY MOUTH DAILY. 180 tablet 2   rosuvastatin (CRESTOR) 40 MG tablet Take 1 tablet (40 mg total) by mouth at bedtime. 90 tablet 1   No current facility-administered medications for this visit.     Past Surgical History:  Procedure Laterality Date   APPENDECTOMY     BIOPSY  11/18/2021   Procedure: BIOPSY;  Surgeon: Dolores Frame, MD;  Location: [facility];  Service: Gastroenterology;;  random colon;   BIOPSY  12/27/2021   Procedure: BIOPSY;  Surgeon: Dolores Frame, MD;  [facility]: [facility];  Service: Gastroenterology;;   CARDIAC CATHETERIZATION     CESAREAN SECTION  1986 & 1988   x2   CHOLECYSTECTOMY     open"gallstones"   COLONOSCOPY WITH PROPOFOL N/A 11/18/2021   Procedure: COLONOSCOPY WITH PROPOFOL;  Surgeon: Dolores Frame, MD;  [facility]: [facility];  Service: Gastroenterology;  Laterality:  N/A;  1215   DILATATION & CURETTAGE/HYSTEROSCOPY WITH MYOSURE N/A 05/10/2017   Procedure: DILATATION & CURETTAGE/HYSTEROSCOPY WITH MYOSURE;  Surgeon: Maxie Better, MD;  Location: [facility];  Service: Gynecology;  Laterality: N/A;   ESOPHAGOGASTRODUODENOSCOPY (EGD) WITH PROPOFOL N/A 12/27/2021   Procedure: ESOPHAGOGASTRODUODENOSCOPY (EGD) WITH PROPOFOL;  Surgeon: Dolores Frame, MD;  [facility]: [facility];  Service: Gastroenterology;  Laterality: N/A;  100   ESOPHAGOGASTRODUODENOSCOPY (EGD) WITH PROPOFOL N/A 01/13/2022   Procedure: ESOPHAGOGASTRODUODENOSCOPY (EGD) WITH PROPOFOL;  Surgeon: Dolores Frame, MD;  [facility]: [facility];  Service: Gastroenterology;  Laterality: N/A;  235, per Soledad Gerlach pt knows to arrive at 6:15   HEMOSTASIS CLIP PLACEMENT  01/13/2022   Procedure: HEMOSTASIS CLIP PLACEMENT;  Surgeon: Dolores Frame, MD;  [facility]: [facility];  Service: Gastroenterology;;   INCISION AND DRAINAGE ABSCESS  02/23/2023   Procedure: INCISION AND DRAINAGE OF PERI RECTAL ABSCESS;  Surgeon: Andria Meuse, MD;  Location: [facility];  Service: General;;   INTERVENTIONAL RADIOLOGY PROCEDURE     CT angiography /neck -Dr. Corliss Skains.   IR ANGIO INTRA EXTRACRAN SEL COM CAROTID INNOMINATE BILAT MOD SED  02/22/2018   IR ANGIO INTRA EXTRACRAN SEL COM CAROTID INNOMINATE BILAT MOD SED  03/08/2022   IR ANGIO VERTEBRAL SEL VERTEBRAL BILAT MOD SED  02/22/2018   IR ANGIO VERTEBRAL SEL VERTEBRAL UNI R MOD SED  03/08/2022   IR GENERIC HISTORICAL  08/03/2016   IR RADIOLOGIST EVAL & MGMT 08/03/2016 [facility]   IR RADIOLOGIST EVAL & MGMT  01/03/2022   LAPAROSCOPIC GASTRIC SLEEVE RESECTION N/A 04/10/2016   Procedure: LAPAROSCOPIC GASTRIC SLEEVE RESECTION WITH UPPER ENDO;  Surgeon: Gaynelle Adu, MD;  Location: [facility];  Service: General;  Laterality: N/A;   LEFT HEART CATHETERIZATION WITH CORONARY ANGIOGRAM N/A 09/28/2014   Procedure: LEFT HEART CATHETERIZATION  WITH CORONARY ANGIOGRAM;  Surgeon: Corky Crafts, MD;  Location: [facility] CATH [facility];  Service: Cardiovascular;  Laterality: N/A;   NEPHROLITHOTOMY Right 02/01/2022   Procedure: RIGHT NEPHROLITHOTOMY PERCUTANEOUS WITH SURGEON ACCESS;  Surgeon: Crist Fat, MD;  Location: [facility];  Service: Urology;  Laterality: Right;   NEPHROLITHOTOMY Left 02/15/2022   Procedure: LEFT NEPHROLITHOTOMY PERCUTANEOUS WITH SURGEON ACCESS, REMOVAL OF RIGHT URETERAL STENT;  Surgeon: Crist Fat, MD;  Location: [facility];  Service: Urology;  Laterality: Left;  195 MINUTES   PLACEMENT OF SETON N/A 02/23/2023   Procedure: SURGICAL TREATMENT OF ANAL FISTULA-TRANSSPHINTERIL WITH PLACEMENT OF SETON;  Surgeon: Andria Meuse, MD;  Location: [facility];  Service: General;  Laterality: N/A;   POLYPECTOMY  01/13/2022   Procedure: POLYPECTOMY;  Surgeon: Dolores Frame, MD;  [facility];  Service: Gastroenterology;;   RADIOLOGY WITH ANESTHESIA N/A 10/14/2014   Procedure: Mingo Amber;  Surgeon: Julieanne Cotton, MD;  Location: [facility];  Service: Radiology;  Laterality: N/A;   TUBAL LIGATION       Allergies  Allergen Reactions   Dilaudid [Hydromorphone Hcl] Nausea And Vomiting   Crab Extract Swelling    Crab Cakes   Crab [Shellfish Allergy] Swelling   Hydrocodone    Hydromorphone     Other Reaction(s): GI Intolerance   Other Other (See Comments)    "pt is a difficult IV stick, prefers IV team paged for IV starts"      Family History  Problem Relation Age of Onset   Cataracts Mother    Kidney disease Mother        dialysis for 26 years   Heart attack Mother        59's   Diabetes Father    Cataracts Father    Heart failure Father    Heart disease  Father    Heart attack Father        4's   Stroke Paternal Grandmother      Social History Jill [name] reports that she has been smoking cigarettes. She started smoking about 47 years ago. She has a 11.9 pack-year  smoking history. She has been exposed to tobacco smoke. She has never used smokeless tobacco. Jill [name] reports no history of alcohol use.   Review of Systems CONSTITUTIONAL: No weight loss, fever, chills, weakness or fatigue.  HEENT: Eyes: No visual loss, blurred vision, double vision or yellow sclerae.No hearing loss, sneezing, congestion, runny nose or sore throat.  SKIN: No rash or itching.  CARDIOVASCULAR: per hpi RESPIRATORY: No shortness of breath, cough or sputum.  GASTROINTESTINAL: No anorexia, nausea, vomiting or diarrhea. No abdominal pain or blood.  GENITOURINARY: No burning on urination, no polyuria NEUROLOGICAL: No headache, dizziness, syncope, paralysis, ataxia, numbness or tingling in the extremities. No change in bowel or bladder control.  MUSCULOSKELETAL: No muscle, back pain, joint pain or stiffness.  LYMPHATICS: No enlarged nodes. No history of splenectomy.  PSYCHIATRIC: No history of depression or anxiety.  ENDOCRINOLOGIC: No reports of sweating, cold or heat intolerance. No polyuria or polydipsia.  Marland Kitchen   Physical Examination Today's Vitals   04/24/23 1409  BP: (!) 122/58  Pulse: 81  SpO2: 99%  Weight: 226 lb (102.5 kg)  Height: 5' 6.5" (1.689 m)   Body mass index is 35.93 kg/m.  Gen: resting comfortably, no acute distress HEENT: no scleral icterus, pupils equal round and reactive, no palptable cervical adenopathy,  CV: RRR, no mrg, no jvd Resp: Clear to auscultation bilaterally GI: abdomen is soft, non-tender, non-distended, normal bowel sounds, no hepatosplenomegaly MSK: extremities are warm, no edema.  Skin: warm, no rash Neuro:  no focal deficits Psych: appropriate affect   Diagnostic Studies 08/2014 Lexiscan MPI IMPRESSION: 1. Large area of ischemia extending from the basal to distal anteroseptal wall.   2. Normal left ventricular wall motion.   3. Left ventricular ejection fraction 60 for%   4. High-risk stress test findings*.   09/2014  Cath HEMODYNAMICS:  Aortic pressure was 142/66; LV pressure was 144/7; LVEDP 18.  There was no gradient between the left ventricle and aorta.     ANGIOGRAPHIC DATA:   The left main coronary artery is widely patent.   The left anterior descending artery is a large vessel which wraps around the apex. There is mild atherosclerosis in the mid vessel. There is a large diagonal vessel which arises proximally. There are several small diagonals which are patent.   The left circumflex artery is a large dominant vessel. There is a ramus vessel which is large and widely patent. There is mild disease in the circumflex system. The first obtuse marginal is small but patent.  There is a small left PDA which is patent.   The right coronary artery is a nondominant vessel. In the mid vessel, there is moderate disease.   LEFT VENTRICULOGRAM:  Left ventricular angiogram was not done due to the question of a dye allergy.  LVEDP was 18 mmHg.   IMPRESSIONS:    Normal left main coronary artery. Mild disease in the left anterior descending artery and its branches. Mild disease in the dominant left circumflex artery and its branches. Moderate disease in the nondominant right coronary artery. Left ventricular systolic function not assessed.  LVEDP 18 mmHg.  6.   False positive nuclear stress test.      Assessment and Plan  1. Orthostatic dizziness/syncope - some ongoing dizziness, will lower lasix to 20mg  every other day  2. HTN - at goal, continue current meds   3. HLD - requesting lipid panel from pcp - goal LDL at least <70, perhaps <29.  - if no recent panel with pcp would repeat  - may consider adding zetia pending results  4. CAD - no symptoms, continue current meds - of note DAPT is for prior cartotid stenting per Dr Corliss Skains lifelong therapy, DAPT is not for a cardiac reason.   5. Chronic diastolic HF - euvolemic, will try lowering lasix to 20mg  every other day due to some ongoing  orthostatic symptoms   F/u 6 months     Antoine Poche, M.D.

## 2023-04-27 DIAGNOSIS — K60329 Anal fistula, complex, unspecified: Secondary | ICD-10-CM | POA: Diagnosis not present

## 2023-05-08 ENCOUNTER — Ambulatory Visit (INDEPENDENT_AMBULATORY_CARE_PROVIDER_SITE_OTHER): Payer: BC Managed Care – PPO | Admitting: Ophthalmology

## 2023-05-08 ENCOUNTER — Encounter (INDEPENDENT_AMBULATORY_CARE_PROVIDER_SITE_OTHER): Payer: Self-pay | Admitting: Ophthalmology

## 2023-05-08 DIAGNOSIS — Z7985 Long-term (current) use of injectable non-insulin antidiabetic drugs: Secondary | ICD-10-CM

## 2023-05-08 DIAGNOSIS — Z7984 Long term (current) use of oral hypoglycemic drugs: Secondary | ICD-10-CM

## 2023-05-08 DIAGNOSIS — I10 Essential (primary) hypertension: Secondary | ICD-10-CM

## 2023-05-08 DIAGNOSIS — H25813 Combined forms of age-related cataract, bilateral: Secondary | ICD-10-CM

## 2023-05-08 DIAGNOSIS — E113593 Type 2 diabetes mellitus with proliferative diabetic retinopathy without macular edema, bilateral: Secondary | ICD-10-CM | POA: Diagnosis not present

## 2023-05-08 DIAGNOSIS — H3563 Retinal hemorrhage, bilateral: Secondary | ICD-10-CM

## 2023-05-08 DIAGNOSIS — H35033 Hypertensive retinopathy, bilateral: Secondary | ICD-10-CM

## 2023-05-08 MED ORDER — BEVACIZUMAB CHEMO INJECTION 1.25MG/0.05ML SYRINGE FOR KALEIDOSCOPE
1.2500 mg | INTRAVITREAL | Status: AC | PRN
Start: 2023-05-08 — End: 2023-05-08
  Administered 2023-05-08: 1.25 mg via INTRAVITREAL

## 2023-05-09 NOTE — Progress Notes (Incomplete)
[facility] - Clinic Note  05/08/2023   CHIEF COMPLAINT Patient presents for Retina Follow Up  HISTORY OF PRESENT ILLNESS: Jill [name] is a 57 y.o. female who presents to the clinic today for:  HPI     Retina Follow Up   Patient presents with  Diabetic Retinopathy.  In both eyes.  This started weeks ago.  Duration of 4 weeks.  I, the attending physician,  performed the HPI with the patient and updated documentation appropriately.        Comments   Patient feels the vision is the same. She is not using eye drops. Her blood sugar was 103 yesterday.       Last edited by Rennis Chris, MD on 05/08/2023 11:58 PM.      Referring physician: Pecolia Ades, MD [address] [place],  [place] [address]  HISTORICAL INFORMATION:  Selected notes from the MEDICAL RECORD NUMBER Referred by Dr. Ilsa Iha for diabetic retinopathy eval -- new subhyaloid heme OD LEE:  Ocular Hx- PMH-   CURRENT MEDICATIONS: No current outpatient medications on file. (Ophthalmic Drugs)   No current facility-administered medications for this visit. (Ophthalmic Drugs)   Current Outpatient Medications (Other)  Medication Sig  . acetaminophen (TYLENOL) 500 MG tablet Take 500-1,000 mg by mouth every 8 (eight) hours as needed for moderate pain or headache.   Marland Kitchen aspirin EC 81 MG tablet Take 81 mg by mouth every morning.  . bismuth subsalicylate (PEPTO BISMOL) 262 MG chewable tablet Chew 524 mg by mouth 3 (three) times daily as needed (stomach pain.).  Marland Kitchen Calcium-Phosphorus-Vitamin D (CITRACAL +D3 PO) Take 2 tablets by mouth in the morning and at bedtime.  . clopidogrel (PLAVIX) 75 MG tablet Take 75 mg by mouth in the morning.  . Cyanocobalamin (VITAMIN B-12) 5000 MCG SUBL Place 5,000 mcg under the tongue 3 (three) times a week.  . docusate sodium (COLACE) 100 MG capsule Take 100 mg by mouth in the morning.  . ezetimibe (ZETIA) 10 MG tablet Take 1 tablet (10 mg total) by mouth daily.  .  felodipine (PLENDIL) 5 MG 24 hr tablet Take 5 mg by mouth in the morning.  . furosemide (LASIX) 20 MG tablet Take 1 tablet (20 mg total) by mouth every other day.  . gabapentin (NEURONTIN) 300 MG capsule Take 300 mg by mouth 2 (two) times daily.  Marland Kitchen lamoTRIgine (LAMICTAL) 100 MG tablet Take 100 mg by mouth every morning.   . levETIRAcetam (KEPPRA) 500 MG tablet Take 250 mg by mouth 2 (two) times daily.  Marland Kitchen linaclotide (LINZESS) 145 MCG CAPS capsule Take 145 mcg by mouth daily before breakfast.  . LORazepam (ATIVAN) 0.5 MG tablet Take 0.5 mg by mouth daily as needed for anxiety.  . metFORMIN (GLUCOPHAGE) 500 MG tablet Take 500 mg by mouth 2 (two) times daily.  . metoprolol succinate (TOPROL-XL) 25 MG 24 hr tablet Take 1 tablet by mouth daily  . Multiple Vitamins-Minerals (BARIATRIC MULTIVITAMINS/IRON PO) Take 1 tablet by mouth every evening.  . olmesartan (BENICAR) 40 MG tablet Take 40 mg by mouth every morning.   Marland Kitchen OZEMPIC, 1 MG/DOSE, 2 MG/1.5ML SOPN Inject 1 mg into the skin every 30 (thirty) days.  . pantoprazole (PROTONIX) 40 MG tablet Take 1 tablet (40 mg total) by mouth 2 (two) times daily.  . pioglitazone (ACTOS) 30 MG tablet Take 30 mg by mouth every morning.  . potassium chloride (KLOR-CON M) 10 MEQ tablet TAKE 2 TABLETS BY MOUTH DAILY.  Marland Kitchen  rosuvastatin (CRESTOR) 40 MG tablet Take 1 tablet (40 mg total) by mouth at bedtime.   No current facility-administered medications for this visit. (Other)   REVIEW OF SYSTEMS: ROS   Positive for: Gastrointestinal, Neurological, Endocrine, Cardiovascular, Eyes Negative for: Constitutional, Skin, Genitourinary, Musculoskeletal, HENT, Respiratory, Psychiatric, Allergic/Imm, Heme/Lymph Last edited by Charlette Caffey, COT on 05/08/2023  9:06 AM.       ALLERGIES Allergies  Allergen Reactions  . Dilaudid [Hydromorphone Hcl] Nausea And Vomiting  . Crab Extract Swelling    Crab Cakes  . Crab [Shellfish Allergy] Swelling  . Hydrocodone   .  Hydromorphone     Other Reaction(s): GI Intolerance  . Other Other (See Comments)    "pt is a difficult IV stick, prefers IV team paged for IV starts"   PAST MEDICAL HISTORY Past Medical History:  Diagnosis Date  . Anemia   . Anxiety   . Back pain    complains when laying on hard flat surface  . Chronic kidney disease    appt. /w urology 11/10/2014- for a cyst seen on [place]. evaluated was told it was nothing.  . Diabetes mellitus    Diabetes II, oral and insulin.  Marland Kitchen DVT (deep venous thrombosis) (HCC)    right leg '09 or '10  . Fibroids   . GERD (gastroesophageal reflux disease)   . Heart murmur   . History of kidney stones   . History of stress test    referred for cardiac cath- done here at [facility]- 09/2014  . Hypercholesterolemia   . Hypertension   . Neuromuscular disorder (HCC)    neuropathy  . PONV (postoperative nausea and vomiting)   . Seizures (HCC)    02/01/23- last seizure approximately 2015;  . Sleep apnea    no longer uses a cpap  . Stroke [facility])    '09-had blockage in brain, too deep to operate"occ left lower extremity fatique, occ. 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   Past Surgical History:  Procedure Laterality Date  . APPENDECTOMY    . BIOPSY  11/18/2021   Procedure: BIOPSY;  Surgeon: Dolores Frame, MD;  [facility];  Service: Gastroenterology;;  random colon;  . BIOPSY  12/27/2021   Procedure: BIOPSY;  Surgeon: Dolores Frame, MD;  Location: [facility];  Service: Gastroenterology;;  . CARDIAC CATHETERIZATION    . CESAREAN SECTION  1986 & 1988   x2  . CHOLECYSTECTOMY     open"gallstones"  . COLONOSCOPY WITH PROPOFOL N/A 11/18/2021   Procedure: COLONOSCOPY WITH PROPOFOL;  Surgeon: Dolores Frame, MD;  [facility]: [facility];  Service: Gastroenterology;  Laterality: N/A;  1215  . DILATATION & CURETTAGE/HYSTEROSCOPY WITH MYOSURE  N/A 05/10/2017   Procedure: DILATATION & CURETTAGE/HYSTEROSCOPY WITH MYOSURE;  Surgeon: Maxie Better, MD;  Location: [facility];  Service: Gynecology;  Laterality: N/A;  . ESOPHAGOGASTRODUODENOSCOPY (EGD) WITH PROPOFOL N/A 12/27/2021   Procedure: ESOPHAGOGASTRODUODENOSCOPY (EGD) WITH PROPOFOL;  Surgeon: Dolores Frame, MD;  [facility];  Service: Gastroenterology;  Laterality: N/A;  100  . ESOPHAGOGASTRODUODENOSCOPY (EGD) WITH PROPOFOL N/A 01/13/2022   Procedure: ESOPHAGOGASTRODUODENOSCOPY (EGD) WITH PROPOFOL;  Surgeon: Dolores Frame, MD;  [facility]: [facility];  Service: Gastroenterology;  Laterality: N/A;  235, per Soledad Gerlach pt knows to arrive at 6:15  . HEMOSTASIS CLIP PLACEMENT  01/13/2022   Procedure: HEMOSTASIS CLIP PLACEMENT;  Surgeon: Marguerita Merles,  Reuel Boom, MD;  Location: [facility];  Service: Gastroenterology;;  . INCISION AND DRAINAGE ABSCESS  02/23/2023   Procedure: INCISION AND DRAINAGE OF PERI RECTAL ABSCESS;  Surgeon: Andria Meuse, MD;  Location: [facility];  Service: General;;  . INTERVENTIONAL RADIOLOGY PROCEDURE     CT angiography /neck -Dr. Corliss Skains.  . IR ANGIO INTRA EXTRACRAN SEL COM CAROTID INNOMINATE BILAT MOD SED  02/22/2018  . IR ANGIO INTRA EXTRACRAN SEL COM CAROTID INNOMINATE BILAT MOD SED  03/08/2022  . IR ANGIO VERTEBRAL SEL VERTEBRAL BILAT MOD SED  02/22/2018  . IR ANGIO VERTEBRAL SEL VERTEBRAL UNI R MOD SED  03/08/2022  . IR GENERIC HISTORICAL  08/03/2016   IR RADIOLOGIST EVAL & MGMT 08/03/2016 [facility]  . IR RADIOLOGIST EVAL & MGMT  01/03/2022  . LAPAROSCOPIC GASTRIC SLEEVE RESECTION N/A 04/10/2016   Procedure: LAPAROSCOPIC GASTRIC SLEEVE RESECTION WITH UPPER ENDO;  Surgeon: Gaynelle Adu, MD;  Location: [facility];  Service: General;  Laterality: N/A;  . LEFT HEART CATHETERIZATION WITH CORONARY ANGIOGRAM N/A 09/28/2014   Procedure: LEFT HEART CATHETERIZATION WITH CORONARY ANGIOGRAM;  Surgeon: Corky Crafts, MD;  Location: [facility] CATH LAB;  Service: Cardiovascular;  Laterality: N/A;  . NEPHROLITHOTOMY Right 02/01/2022   Procedure: RIGHT NEPHROLITHOTOMY PERCUTANEOUS WITH SURGEON ACCESS;  Surgeon: Crist Fat, MD;  Location: [facility];  Service: Urology;  Laterality: Right;  . NEPHROLITHOTOMY Left 02/15/2022   Procedure: LEFT NEPHROLITHOTOMY PERCUTANEOUS WITH SURGEON ACCESS, REMOVAL OF RIGHT URETERAL STENT;  Surgeon: Crist Fat, MD;  Location: [facility];  Service: Urology;  Laterality: Left;  195 MINUTES  . PLACEMENT OF SETON N/A 02/23/2023   Procedure: SURGICAL TREATMENT OF ANAL FISTULA-TRANSSPHINTERIL WITH PLACEMENT OF SETON;  Surgeon: Andria Meuse, MD;  Location: [facility];  Service: General;  Laterality: N/A;  . POLYPECTOMY  01/13/2022   Procedure: POLYPECTOMY;  Surgeon: Dolores Frame, MD;  [facility];  Service: Gastroenterology;;  . RADIOLOGY WITH ANESTHESIA N/A 10/14/2014   Procedure: Mingo Amber;  Surgeon: Julieanne Cotton, MD;  Location: [facility];  Service: Radiology;  Laterality: N/A;  . TUBAL LIGATION     FAMILY HISTORY Family History  Problem Relation Age of Onset  . Cataracts Mother   . Kidney disease Mother        dialysis for 26 years  . Heart attack Mother        71's  . Diabetes Father   . Cataracts Father   . Heart failure Father   . Heart disease Father   . Heart attack Father        71's  . Stroke Paternal Grandmother    SOCIAL HISTORY Social History   Tobacco Use  . Smoking status: Every Day    Current packs/day: 0.25    Average packs/day: 0.3 packs/day for 47.8 years (11.9 ttl pk-yrs)    Types: Cigarettes    Start date: 07/25/1975    Passive exposure: Current  . Smokeless tobacco: Never  Vaping Use  . Vaping status: Never Used  Substance Use Topics  . Alcohol use: Never  . Drug use: No       OPHTHALMIC EXAM:  Base Eye Exam     Visual Acuity (Snellen - Linear)       Right Left   Dist cc 20/20  20/30   Dist ph cc  20/25 +2    Correction: Glasses         Tonometry (Tonopen, 9:12 AM)  Right Left   Pressure 14 16         Pupils       Dark Light Shape React APD   Right 2 1 Round Brisk None   Left 2 1 Round Brisk None         Visual Fields       Left Right    Full Full         Extraocular Movement       Right Left    Full, Ortho Full, Ortho         Neuro/Psych     Oriented x3: Yes   Mood/Affect: Normal         Dilation     Both eyes: 1.0% Mydriacyl, 2.5% Phenylephrine @ 9:07 AM           Slit Lamp and Fundus Exam     Slit Lamp Exam       Right Left   Lids/Lashes Dermatochalasis - upper lid Dermatochalasis - upper lid   Conjunctiva/Sclera mild melanosis mild melanosis   Cornea tear film debris tear film debris   Anterior Chamber deep and clear deep and clear   Iris Round and dilated, No NVI Round and dilated, No NVI   Lens 2+ Nuclear sclerosis, 2-3+ Cortical cataract 2+ Nuclear sclerosis, 2-3+ Cortical cataract, 2+ Posterior subcapsular cataract   Anterior Vitreous Vitreous syneresis, interval improvement in vitreous and subhyaloid heme, trace VH remnants settled inferiorly -- white in color Vitreous syneresis         Fundus Exam       Right Left   Disc Pink and Sharp, no NVD, +fibrosis Pink and Sharp, +cupping   C/D Ratio 0.5 0.65   Macula Flat, Good foveal reflex, RPE mottling and clumping, No heme or edema Flat, Good foveal reflex, RPE mottling and clumping, scattered MA/DBH -- improved, no frank edema   Vessels attenuated, Tortuous, early patches of NVE inferior midzone -- regressed attenuated, tortuous, early midzonal NVE   Periphery Attached, boat shaped subhyaloid heme inferior midzone increased and more diffuse Attached, scattered MA, white now boat shaped subhayloid / pre retinal heme inferior midzone -- improving, good PRP changes nasal hemisphere           Refraction     Wearing Rx       Sphere Cylinder  Axis   Right -2.00 +0.50 178   Left -2.00 +0.75 014           IMAGING AND PROCEDURES  Imaging and Procedures for 05/08/2023  OCT, Retina - OU - Both Eyes       Right Eye Quality was good. Central Foveal Thickness: 206. Progression has improved. Findings include normal foveal contour, no IRF, no SRF, intraretinal hyper-reflective material (Partial PVD with mild interval improvement in vitreous opacities).   Left Eye Quality was good. Central Foveal Thickness: 217. Progression has improved. Findings include normal foveal contour, no IRF, no SRF, vitreomacular adhesion (No DME, Mild diffuse atrophy, stable improvement in pre-retinal hyper reflective material along inferior arcades, interval improvement in vitreous / subhyaloid opacities).   Notes *Images captured and stored on drive  Diagnosis / Impression:  No DME OU OD: Partial PVD with mild interval improvement in vitreous opacities OS: No DME, Mild diffuse atrophy, stable improvement in pre-retinal hyper reflective material along inferior arcades, interval improvement in vitreous / subhyaloid opacities  Clinical management:  See below  Abbreviations: NFP - Normal foveal profile. CME - cystoid macular edema. PED - pigment epithelial  detachment. IRF - intraretinal fluid. SRF - subretinal fluid. EZ - ellipsoid zone. ERM - epiretinal membrane. ORA - outer retinal atrophy. ORT - outer retinal tubulation. SRHM - subretinal hyper-reflective material. IRHM - intraretinal hyper-reflective material      Fluorescein Angiography Optos (Transit OD)       Right Eye Progression has improved. Early phase findings include delayed filling, microaneurysm, vascular perfusion defect (Delayed filling, focal NVE and vascular non-perfusion IN midzone -- improved). Mid/Late phase findings include microaneurysm, vascular perfusion defect (focal NVE and vascular non-perfusion IN midzone -- improved, no leakage).   Left Eye Progression has improved.  Early phase findings include blockage, staining, microaneurysm (Interval improvement in NV-- no NVE). Mid/Late phase findings include blockage, staining, microaneurysm, vascular perfusion defect (punctate NVE greatest inferior midzone -- resolved, boat shaped heme inferior to disc- resolved).   Notes **Images stored on drive**  Impression: PDR OU (OD>OS) OD: focal NVE and vascular non-perfusion IN midzone -- improved w/ regression of NV, no leakage OS: punctate NVE greatest inferior midzone -- resolved, boat shaped heme inferior to disc- resolved      Intravitreal Injection, Pharmacologic Agent - OD - Right Eye       Time Out 05/08/2023. 10:25 AM. Confirmed correct patient, procedure, site, and patient consented.   Anesthesia Topical anesthesia was used. Anesthetic medications included Lidocaine 2%, Proparacaine 0.5%.   Procedure Preparation included 5% betadine to ocular surface, eyelid speculum. A supplied needle was used.   Injection: 1.25 mg Bevacizumab 1.25mg /0.70ml   Route: Intravitreal, Site: Right Eye   NDC: P3213405, Lot: [id number], Expiration date: 06/30/2023   Post-op Post injection exam found visual acuity of at least counting fingers. The patient tolerated the procedure well. There were no complications. The patient received written and verbal post procedure care education.      Intravitreal Injection, Pharmacologic Agent - OS - Left Eye       Time Out 05/08/2023. 10:25 AM. Confirmed correct patient, procedure, site, and patient consented.   Anesthesia Topical anesthesia was used. Anesthetic medications included Lidocaine 2%, Proparacaine 0.5%.   Procedure Preparation included 5% betadine to ocular surface, eyelid speculum. A (32g) needle was used.   Injection: 1.25 mg Bevacizumab 1.25mg /0.34ml   Route: Intravitreal, Site: Left Eye   NDC: P3213405, Lot: [id number], Expiration date: 06/22/2023   Post-op Post injection exam found visual acuity of at  least counting fingers. The patient tolerated the procedure well. There were no complications. The patient received written and verbal post procedure care education.           ASSESSMENT/PLAN:   ICD-10-CM   1. 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 Subhyaloid hemorrhage of both eyes  H35.63     3. Long term (current) use of oral hypoglycemic drugs  Z79.84     4. Long-term (current) use of injectable non-insulin antidiabetic drugs  Z79.85     5. Essential hypertension  I10     6. Hypertensive retinopathy of both eyes  H35.033     7. Combined forms of age-related cataract of both eyes  H25.813  1-4. Proliferative diabetic retinopathy w/o DME, OU - s/p IVA OD #1 (05.23.24), #2 (06.24.24), #3 (06.24.24), #4 (08.20.24), #5 (09.17.24)  - s/p IVA OS #1 (06.24.24), #2 (06.24.24), #3 (08.20.24), #4 (09.17.24) - s/p PRP OS (06.03.24) -- incomplete / aborted due to history of photogenic seizures  - OD with subhyaloid heme -- improved; OS with persistent subhyaloid heme today - Repeat FA today (10.15.24) shows [id number]:[id number] NVE and vascular non-perfusion IN midzone -- improved, no leakage, OS: punctate NVE greatest inferior midzone -- resolved, boat shaped heme inferior to disc- resolved - Repeat FA (05.23.24) shows OD: Boat shaped blockage inferior midzone from subhyaloid heme, focal NVE and vascular non-perfusion IN midzone; OS: Scattered punctate NVE greatest inferior midzone -- pt needs PRP OU - repeat FA (07.23.24): OD: low fluorescein signal, focal NVE and vascular non-perfusion IN midzone -- improved; OS: Low fluorescein  signal, punctate NVE greatest inferior midzone -- improved, new boat shaped heme inferior to disc - repeat FA (10.15.24) OD: focal NVE and vascular non-perfusion IN midzone -- improved w/ regression of NV, no leakage; OS: punctate NVE greatest inferior midzone -- resolved, boat shaped heme inferior to disc- resolved - OCT shows OD: Partial PVD with mild interval improvement in vitreous opacities OS: No DME, Mild diffuse atrophy, stablel improvement in pre-retinal hyper reflective material along inferior arcades, interval improvement in vitreous / subhyaloid opacities - recommend IVA OU (OD #6 and OS #5) today, 10.15.24 for PDR and subhyaloid hemes - pt wishes to proceed  - RBA of procedure discussed, questions answered - informed consent obtained and signed 05.23.24 (OU) - see procedure note - discussed benefit of PRP laser OU and concern with history of photogenic seizures -- may need to have laser in OR under general anesthesia - working plan is to stabilize hemorrhages with anti-VEGF therapy and then possibly take pt to OR for PRP OU - f/u 4 weeks -- DFE/OCT, possible injxns  5,6. Hypertensive retinopathy OU - discussed importance of tight BP control - monitor  7. Mixed Cataract OU - The symptoms of cataract, surgical options, and treatments and risks were discussed with patient. - discussed diagnosis and progression - monitor  8. Hx of photogenic seizures  Ophthalmic Meds Ordered this visit:  Meds ordered this encounter  Medications  . Bevacizumab (AVASTIN) SOLN 1.25 mg  . Bevacizumab (AVASTIN) SOLN 1.25 mg     Return in about 6 weeks (around 06/19/2023) for f/u PDR OU, DFE, OCT, Possible, IVA, OU.  There are no Patient Instructions on file for this visit.  Explained the diagnoses, plan, and follow up with the patient and they expressed understanding.  Patient expressed understanding of the importance of proper follow up care.   This document serves as a record of services  personally performed by Karie Chimera, MD, PhD. It was created on their behalf by Laurey Morale, COT an ophthalmic technician. The creation of this record is the provider's dictation and/or activities during the visit.    Electronically signed by:  Charlette Caffey, COT  05/08/23 11:58 PM  Karie Chimera, M.D., Ph.D. Diseases & Surgery of the Retina and Vitreous [facility]    Abbreviations: M myopia (nearsighted); A astigmatism; H hyperopia (farsighted); P presbyopia; Mrx spectacle prescription;  CTL contact lenses; OD right eye; OS left eye; OU both eyes  XT exotropia; ET esotropia; PEK punctate epithelial keratitis; PEE punctate epithelial erosions; DES dry eye syndrome; MGD meibomian gland dysfunction; ATs artificial tears; PFAT's preservative free artificial  tears; NSC nuclear sclerotic cataract; PSC posterior subcapsular cataract; ERM epi-retinal membrane; PVD posterior vitreous detachment; RD retinal detachment; DM diabetes mellitus; DR diabetic retinopathy; NPDR non-proliferative diabetic retinopathy; PDR proliferative diabetic retinopathy; CSME clinically significant macular edema; DME diabetic macular edema; dbh dot blot hemorrhages; CWS cotton wool spot; POAG primary open angle glaucoma; C/D cup-to-disc ratio; HVF humphrey visual field; GVF goldmann visual field; OCT optical coherence tomography; IOP intraocular pressure; BRVO Branch retinal vein occlusion; CRVO central retinal vein occlusion; CRAO central retinal artery occlusion; BRAO branch retinal artery occlusion; RT retinal tear; SB scleral buckle; PPV pars plana vitrectomy; VH Vitreous hemorrhage; PRP panretinal laser photocoagulation; IVK intravitreal kenalog; VMT vitreomacular traction; MH Macular hole;  NVD neovascularization of the disc; NVE neovascularization elsewhere; AREDS age related eye disease study; ARMD age related macular degeneration; POAG primary open angle glaucoma; EBMD  epithelial/anterior basement membrane dystrophy; ACIOL anterior chamber intraocular lens; IOL intraocular lens; PCIOL posterior chamber intraocular lens; Phaco/IOL phacoemulsification with intraocular lens placement; PRK photorefractive keratectomy; LASIK laser assisted in situ keratomileusis; HTN hypertension; DM diabetes mellitus; COPD chronic obstructive pulmonary disease

## 2023-05-14 ENCOUNTER — Encounter (HOSPITAL_BASED_OUTPATIENT_CLINIC_OR_DEPARTMENT_OTHER): Payer: Self-pay | Admitting: Surgery

## 2023-05-14 NOTE — Progress Notes (Addendum)
Spoke w/ via phone for pre-op interview--- [employer]----    ISTAT per anesthesia. Surgeon orders pending.     Lab results------Current EKG in Epic dated 10/20/22 COVID test -----patient states asymptomatic no test needed Arrive at -------0530 NPO after MN NO Solid Food.  Med rec completed Medications to take morning of surgery -----Lamictal, Neurontin, Keppra, Metoprolol and protonix. Ativan PRN. Diabetic medication ----- NONE AM of surgery. Patient instructed no nail polish to be worn day of surgery Patient instructed to bring photo id and insurance card day of surgery Patient aware to have Driver (ride ) / caregiver    for 24 hours after surgery - Jill [name]-husband Patient Special Instructions ----- Patient takes Ozempic, last dose prior to procedure will be 05/14/23. Patient verbalized understanding to not take another dose within 7 days of procedure. Pre-Op special Instructions ----- Patient verbalized understanding of instructions that were given at this phone interview. Patient denies chest pain, sob, fever, cough at the interview.   *hold plavix 5 days prior to surgery, pt verbalized understanding.

## 2023-05-21 ENCOUNTER — Other Ambulatory Visit (HOSPITAL_COMMUNITY): Payer: Self-pay | Admitting: Family Medicine

## 2023-05-21 DIAGNOSIS — Z0000 Encounter for general adult medical examination without abnormal findings: Secondary | ICD-10-CM | POA: Diagnosis not present

## 2023-05-21 DIAGNOSIS — F17200 Nicotine dependence, unspecified, uncomplicated: Secondary | ICD-10-CM

## 2023-05-21 DIAGNOSIS — Z720 Tobacco use: Secondary | ICD-10-CM

## 2023-05-21 DIAGNOSIS — I739 Peripheral vascular disease, unspecified: Secondary | ICD-10-CM | POA: Diagnosis not present

## 2023-05-21 DIAGNOSIS — G4733 Obstructive sleep apnea (adult) (pediatric): Secondary | ICD-10-CM | POA: Diagnosis not present

## 2023-05-21 DIAGNOSIS — E1169 Type 2 diabetes mellitus with other specified complication: Secondary | ICD-10-CM | POA: Diagnosis not present

## 2023-05-21 DIAGNOSIS — F411 Generalized anxiety disorder: Secondary | ICD-10-CM | POA: Diagnosis not present

## 2023-05-21 DIAGNOSIS — I119 Hypertensive heart disease without heart failure: Secondary | ICD-10-CM | POA: Diagnosis not present

## 2023-05-21 DIAGNOSIS — K604 Rectal fistula, unspecified: Secondary | ICD-10-CM | POA: Diagnosis not present

## 2023-05-21 DIAGNOSIS — I2510 Atherosclerotic heart disease of native coronary artery without angina pectoris: Secondary | ICD-10-CM | POA: Diagnosis not present

## 2023-05-21 DIAGNOSIS — Z87891 Personal history of nicotine dependence: Secondary | ICD-10-CM

## 2023-05-21 DIAGNOSIS — B3731 Acute candidiasis of vulva and vagina: Secondary | ICD-10-CM | POA: Diagnosis not present

## 2023-05-23 NOTE — Anesthesia Preprocedure Evaluation (Signed)
Anesthesia Evaluation  Patient identified by MRN, date of birth, ID band Patient awake    Reviewed: Allergy & Precautions, NPO status , Patient's Chart, lab work & pertinent test results  History of Anesthesia Complications (+) PONV  Airway Mallampati: II  TM Distance: >3 FB Neck ROM: Full    Dental no notable dental hx. (+) Teeth Intact, Dental Advisory Given   Pulmonary sleep apnea and Continuous Positive Airway Pressure Ventilation , Current Smoker and Patient abstained from smoking.   Pulmonary exam normal breath sounds clear to auscultation       Cardiovascular hypertension, Pt. on medications and Pt. on home beta blockers + DVT  Normal cardiovascular exam Rhythm:Regular Rate:Normal  09/12/2022 echo 1. Left ventricular ejection fraction, by estimation, is 65 to 70%. The  left ventricle has normal function. The left ventricle has no regional  wall motion abnormalities. Left ventricular diastolic parameters are  consistent with Grade I diastolic  dysfunction (impaired relaxation). The average left ventricular global  longitudinal strain is -22.8 %. The global longitudinal strain is normal.   2. Right ventricular systolic function is normal. The right ventricular  size is normal. Tricuspid regurgitation signal is inadequate for assessing  PA pressure.   3. The mitral valve is normal in structure. Trivial mitral valve  regurgitation. No evidence of mitral stenosis.   4. The aortic valve is tricuspid. There is mild calcification of the  aortic valve. There is mild thickening of the aortic valve. Aortic valve  regurgitation is not visualized. No aortic stenosis is present.      Neuro/Psych Seizures - (last 2015), Well Controlled,   Anxiety     CVA (L sided weakness), Residual Symptoms    GI/Hepatic ,GERD  Medicated and Controlled,,  Endo/Other  diabetes, Type 2, Oral Hypoglycemic Agents  On ozemoic last dose   Renal/GU Renal diseaseLab Results      Component                Value               Date                                  K                        4.4                 05/24/2023                    BUN                      23 (H)              05/24/2023                CREATININE               1.10 (H)            05/24/2023                     Musculoskeletal negative musculoskeletal ROS (+)    Abdominal  (+) + obese (35.2)  Peds  Hematology negative hematology ROS (+) Lab Results      Component                Value  Date                             HGB                      13.6                05/24/2023                HCT                      40.0                05/24/2023                  Anesthesia Other Findings   Reproductive/Obstetrics                             Anesthesia Physical Anesthesia Plan  ASA: 3  Anesthesia Plan: General   Post-op Pain Management: Tylenol PO (pre-op)* and Precedex   Induction: Intravenous  PONV Risk Score and Plan: 4 or greater and Treatment may vary due to age or medical condition, Ondansetron and Midazolam  Airway Management Planned: Oral ETT  Additional Equipment: None  Intra-op Plan:   Post-operative Plan: Extubation in OR  Informed Consent: I have reviewed the patients History and Physical, chart, labs and discussed the procedure including the risks, benefits and alternatives for the proposed anesthesia with the patient or authorized representative who has indicated his/her understanding and acceptance.     Dental advisory given  Plan Discussed with: CRNA  Anesthesia Plan Comments:         Anesthesia Quick Evaluation

## 2023-05-24 ENCOUNTER — Ambulatory Visit (HOSPITAL_BASED_OUTPATIENT_CLINIC_OR_DEPARTMENT_OTHER): Payer: Self-pay | Admitting: Anesthesiology

## 2023-05-24 ENCOUNTER — Other Ambulatory Visit: Payer: Self-pay

## 2023-05-24 ENCOUNTER — Ambulatory Visit (HOSPITAL_BASED_OUTPATIENT_CLINIC_OR_DEPARTMENT_OTHER)
Admission: RE | Admit: 2023-05-24 | Discharge: 2023-05-24 | Disposition: A | Discharge status: Home / Self Care | Payer: BC Managed Care – PPO | Attending: Surgery | Admitting: Surgery

## 2023-05-24 ENCOUNTER — Ambulatory Visit (HOSPITAL_BASED_OUTPATIENT_CLINIC_OR_DEPARTMENT_OTHER): Payer: BC Managed Care – PPO | Admitting: Anesthesiology

## 2023-05-24 ENCOUNTER — Encounter (HOSPITAL_BASED_OUTPATIENT_CLINIC_OR_DEPARTMENT_OTHER): Payer: Self-pay | Admitting: Surgery

## 2023-05-24 ENCOUNTER — Encounter (HOSPITAL_BASED_OUTPATIENT_CLINIC_OR_DEPARTMENT_OTHER)
Admission: RE | Disposition: A | Discharge status: Home / Self Care | Payer: Self-pay | Source: Home / Self Care | Attending: Surgery

## 2023-05-24 DIAGNOSIS — Z833 Family history of diabetes mellitus: Secondary | ICD-10-CM | POA: Insufficient documentation

## 2023-05-24 DIAGNOSIS — Z6835 Body mass index (BMI) 35.0-35.9, adult: Secondary | ICD-10-CM | POA: Insufficient documentation

## 2023-05-24 DIAGNOSIS — E669 Obesity, unspecified: Secondary | ICD-10-CM | POA: Diagnosis not present

## 2023-05-24 DIAGNOSIS — I129 Hypertensive chronic kidney disease with stage 1 through stage 4 chronic kidney disease, or unspecified chronic kidney disease: Secondary | ICD-10-CM | POA: Diagnosis not present

## 2023-05-24 DIAGNOSIS — E785 Hyperlipidemia, unspecified: Secondary | ICD-10-CM | POA: Insufficient documentation

## 2023-05-24 DIAGNOSIS — I700 Atherosclerosis of aorta: Secondary | ICD-10-CM | POA: Insufficient documentation

## 2023-05-24 DIAGNOSIS — E7800 Pure hypercholesterolemia, unspecified: Secondary | ICD-10-CM | POA: Diagnosis not present

## 2023-05-24 DIAGNOSIS — R569 Unspecified convulsions: Secondary | ICD-10-CM | POA: Insufficient documentation

## 2023-05-24 DIAGNOSIS — Z7984 Long term (current) use of oral hypoglycemic drugs: Secondary | ICD-10-CM | POA: Diagnosis not present

## 2023-05-24 DIAGNOSIS — Z9049 Acquired absence of other specified parts of digestive tract: Secondary | ICD-10-CM | POA: Diagnosis not present

## 2023-05-24 DIAGNOSIS — Z86718 Personal history of other venous thrombosis and embolism: Secondary | ICD-10-CM | POA: Insufficient documentation

## 2023-05-24 DIAGNOSIS — N189 Chronic kidney disease, unspecified: Secondary | ICD-10-CM | POA: Insufficient documentation

## 2023-05-24 DIAGNOSIS — E1169 Type 2 diabetes mellitus with other specified complication: Secondary | ICD-10-CM

## 2023-05-24 DIAGNOSIS — I69354 Hemiplegia and hemiparesis following cerebral infarction affecting left non-dominant side: Secondary | ICD-10-CM | POA: Diagnosis not present

## 2023-05-24 DIAGNOSIS — F17210 Nicotine dependence, cigarettes, uncomplicated: Secondary | ICD-10-CM | POA: Insufficient documentation

## 2023-05-24 DIAGNOSIS — G4730 Sleep apnea, unspecified: Secondary | ICD-10-CM | POA: Insufficient documentation

## 2023-05-24 DIAGNOSIS — E1122 Type 2 diabetes mellitus with diabetic chronic kidney disease: Secondary | ICD-10-CM | POA: Diagnosis not present

## 2023-05-24 DIAGNOSIS — K219 Gastro-esophageal reflux disease without esophagitis: Secondary | ICD-10-CM | POA: Insufficient documentation

## 2023-05-24 DIAGNOSIS — K60321 Anal fistula, complex, initial: Secondary | ICD-10-CM | POA: Insufficient documentation

## 2023-05-24 DIAGNOSIS — I10 Essential (primary) hypertension: Secondary | ICD-10-CM | POA: Diagnosis not present

## 2023-05-24 DIAGNOSIS — K60311 Anal fistula, simple, initial: Secondary | ICD-10-CM | POA: Diagnosis not present

## 2023-05-24 HISTORY — PX: RECTAL EXAM UNDER ANESTHESIA: SHX6399

## 2023-05-24 HISTORY — PX: LIGATION OF INTERNAL FISTULA TRACT: SHX6551

## 2023-05-24 LAB — POCT I-STAT, CHEM 8
BUN: 23 mg/dL — ABNORMAL HIGH (ref 6–20)
Calcium, Ion: 1.48 mmol/L — ABNORMAL HIGH (ref 1.15–1.40)
Chloride: 106 mmol/L (ref 98–111)
Creatinine, Ser: 1.1 mg/dL — ABNORMAL HIGH (ref 0.44–1.00)
Glucose, Bld: 125 mg/dL — ABNORMAL HIGH (ref 70–99)
HCT: 40 % (ref 36.0–46.0)
Hemoglobin: 13.6 g/dL (ref 12.0–15.0)
Potassium: 4.4 mmol/L (ref 3.5–5.1)
Sodium: 142 mmol/L (ref 135–145)
TCO2: 25 mmol/L (ref 22–32)

## 2023-05-24 LAB — GLUCOSE, CAPILLARY: Glucose-Capillary: 126 mg/dL — ABNORMAL HIGH (ref 70–99)

## 2023-05-24 SURGERY — LIGATION, INTERNAL FISTULA TRACT
Anesthesia: General

## 2023-05-24 MED ORDER — STERILE WATER FOR IRRIGATION IR SOLN
Status: DC | PRN
Start: 2023-05-24 — End: 2023-05-24
  Administered 2023-05-24: 500 mL

## 2023-05-24 MED ORDER — TRAMADOL HCL 50 MG PO TABS: 50.0000 mg | ORAL_TABLET | Freq: Four times a day (QID) | ORAL | 0 refills | Status: AC | PRN

## 2023-05-24 MED ORDER — FENTANYL CITRATE (PF) 100 MCG/2ML IJ SOLN
INTRAMUSCULAR | Status: DC | PRN
  Administered 2023-05-24: 25 ug via INTRAVENOUS

## 2023-05-24 MED ORDER — ACETAMINOPHEN 10 MG/ML IV SOLN
1000.0000 mg | Freq: Once | INTRAVENOUS | Status: DC | PRN
Start: 2023-05-24 — End: 2023-05-24

## 2023-05-24 MED ORDER — PHENYLEPHRINE 80 MCG/ML (10ML) SYRINGE FOR IV PUSH (FOR BLOOD PRESSURE SUPPORT)
PREFILLED_SYRINGE | INTRAVENOUS | Status: DC | PRN
  Administered 2023-05-24: 80 ug via INTRAVENOUS

## 2023-05-24 MED ORDER — ROCURONIUM BROMIDE 10 MG/ML (PF) SYRINGE
PREFILLED_SYRINGE | INTRAVENOUS | Status: AC
  Filled 2023-05-24: qty 10

## 2023-05-24 MED ORDER — ONDANSETRON HCL 4 MG/2ML IJ SOLN
INTRAMUSCULAR | Status: DC | PRN
  Administered 2023-05-24: 4 mg via INTRAVENOUS

## 2023-05-24 MED ORDER — SODIUM CHLORIDE 0.9 % IV SOLN
2.0000 g | INTRAVENOUS | Status: AC
  Administered 2023-05-24: 2 g via INTRAVENOUS

## 2023-05-24 MED ORDER — BUPIVACAINE LIPOSOME 1.3 % IJ SUSP: 20.0000 mL | Freq: Once | INTRAMUSCULAR | Status: DC

## 2023-05-24 MED ORDER — OXYCODONE HCL 5 MG/5ML PO SOLN: 5.0000 mg | Freq: Once | ORAL | Status: DC | PRN

## 2023-05-24 MED ORDER — DEXAMETHASONE SODIUM PHOSPHATE 10 MG/ML IJ SOLN
INTRAMUSCULAR | Status: AC
  Filled 2023-05-24: qty 1

## 2023-05-24 MED ORDER — FENTANYL CITRATE (PF) 100 MCG/2ML IJ SOLN: 25.0000 ug | INTRAMUSCULAR | Status: DC | PRN

## 2023-05-24 MED ORDER — DEXAMETHASONE SODIUM PHOSPHATE 10 MG/ML IJ SOLN
INTRAMUSCULAR | Status: DC | PRN
  Administered 2023-05-24: 4 mg via INTRAVENOUS

## 2023-05-24 MED ORDER — FENTANYL CITRATE (PF) 100 MCG/2ML IJ SOLN
INTRAMUSCULAR | Status: AC
  Filled 2023-05-24: qty 2

## 2023-05-24 MED ORDER — PROPOFOL 10 MG/ML IV BOLUS
INTRAVENOUS | Status: DC | PRN
  Administered 2023-05-24: 20 mg via INTRAVENOUS
  Administered 2023-05-24: 120 mg via INTRAVENOUS

## 2023-05-24 MED ORDER — ROCURONIUM BROMIDE 10 MG/ML (PF) SYRINGE
PREFILLED_SYRINGE | INTRAVENOUS | Status: DC | PRN
  Administered 2023-05-24: 70 mg via INTRAVENOUS

## 2023-05-24 MED ORDER — PROPOFOL 500 MG/50ML IV EMUL
INTRAVENOUS | Status: AC
Start: 2023-05-24 — End: ?
  Filled 2023-05-24: qty 50

## 2023-05-24 MED ORDER — ONDANSETRON HCL 4 MG/2ML IJ SOLN
INTRAMUSCULAR | Status: AC
  Filled 2023-05-24: qty 2

## 2023-05-24 MED ORDER — LIDOCAINE HCL (PF) 2 % IJ SOLN
INTRAMUSCULAR | Status: AC
  Filled 2023-05-24: qty 5

## 2023-05-24 MED ORDER — FLEET ENEMA RE ENEM: 1.0000 | ENEMA | Freq: Once | RECTAL | Status: DC

## 2023-05-24 MED ORDER — ACETAMINOPHEN 500 MG PO TABS
ORAL_TABLET | ORAL | Status: AC
  Filled 2023-05-24: qty 2

## 2023-05-24 MED ORDER — ACETAMINOPHEN 500 MG PO TABS: 1000.0000 mg | ORAL_TABLET | ORAL | Status: DC

## 2023-05-24 MED ORDER — PHENYLEPHRINE 80 MCG/ML (10ML) SYRINGE FOR IV PUSH (FOR BLOOD PRESSURE SUPPORT)
PREFILLED_SYRINGE | INTRAVENOUS | Status: AC
  Filled 2023-05-24: qty 10

## 2023-05-24 MED ORDER — MIDAZOLAM HCL 5 MG/5ML IJ SOLN
INTRAMUSCULAR | Status: DC | PRN
  Administered 2023-05-24: 1 mg via INTRAVENOUS

## 2023-05-24 MED ORDER — SODIUM CHLORIDE 0.9 % IV SOLN
INTRAVENOUS | Status: AC
  Filled 2023-05-24: qty 2

## 2023-05-24 MED ORDER — LACTATED RINGERS IV SOLN: INTRAVENOUS | Status: DC

## 2023-05-24 MED ORDER — SUGAMMADEX SODIUM 200 MG/2ML IV SOLN
INTRAVENOUS | Status: DC | PRN
  Administered 2023-05-24: 200 mg via INTRAVENOUS

## 2023-05-24 MED ORDER — BUPIVACAINE-EPINEPHRINE (PF) 0.5% -1:200000 IJ SOLN
INTRAMUSCULAR | Status: DC | PRN
  Administered 2023-05-24: 28 mL via SURGICAL_CAVITY

## 2023-05-24 MED ORDER — OXYCODONE HCL 5 MG PO TABS: 5.0000 mg | ORAL_TABLET | Freq: Once | ORAL | Status: DC | PRN

## 2023-05-24 MED ORDER — ONDANSETRON HCL 4 MG/2ML IJ SOLN: 4.0000 mg | Freq: Once | INTRAMUSCULAR | Status: DC | PRN

## 2023-05-24 MED ORDER — LIDOCAINE 2% (20 MG/ML) 5 ML SYRINGE
INTRAMUSCULAR | Status: DC | PRN
  Administered 2023-05-24: 60 mg via INTRAVENOUS

## 2023-05-24 MED ORDER — ACETAMINOPHEN 500 MG PO TABS
1000.0000 mg | ORAL_TABLET | Freq: Once | ORAL | Status: AC
Start: 2023-05-24 — End: 2023-05-24
  Administered 2023-05-24: 1000 mg via ORAL

## 2023-05-24 MED ORDER — 0.9 % SODIUM CHLORIDE (POUR BTL) OPTIME
TOPICAL | Status: DC | PRN
Start: 2023-05-24 — End: 2023-05-24
  Administered 2023-05-24: 500 mL

## 2023-05-24 MED ORDER — DROPERIDOL 2.5 MG/ML IJ SOLN: 0.6250 mg | Freq: Once | INTRAMUSCULAR | Status: DC | PRN

## 2023-05-24 MED ORDER — WHITE PETROLATUM EX OINT
TOPICAL_OINTMENT | CUTANEOUS | Status: AC
  Filled 2023-05-24: qty 5

## 2023-05-24 MED ORDER — SODIUM CHLORIDE 0.9 % IV SOLN: INTRAVENOUS | Status: DC | PRN

## 2023-05-24 MED ORDER — MIDAZOLAM HCL 2 MG/2ML IJ SOLN
INTRAMUSCULAR | Status: AC
  Filled 2023-05-24: qty 2

## 2023-05-24 MED ORDER — SCOPOLAMINE 1 MG/3DAYS TD PT72
1.0000 | MEDICATED_PATCH | TRANSDERMAL | Status: DC
  Administered 2023-05-24: 1.5 mg via TRANSDERMAL

## 2023-05-24 MED ORDER — SCOPOLAMINE 1 MG/3DAYS TD PT72
MEDICATED_PATCH | TRANSDERMAL | Status: AC
  Filled 2023-05-24: qty 1

## 2023-05-24 SURGICAL SUPPLY — 68 items
APL SKNCLS STERI-STRIP NONHPOA (GAUZE/BANDAGES/DRESSINGS) ×1
BENZOIN TINCTURE PRP APPL 2/3 (GAUZE/BANDAGES/DRESSINGS) ×2 IMPLANT
BLADE EXTENDED COATED 6.5IN (ELECTRODE) IMPLANT
BLADE SURG 10 STRL SS (BLADE) IMPLANT
BLADE SURG 15 STRL LF DISP TIS (BLADE) ×1 IMPLANT
BLADE SURG 15 STRL SS (BLADE) ×1
BRIEF MESH DISP LRG (UNDERPADS AND DIAPERS) ×1 IMPLANT
COVER BACK TABLE 60X90IN (DRAPES) ×1 IMPLANT
COVER MAYO STAND STRL (DRAPES) ×1 IMPLANT
DRAPE HYSTEROSCOPY (MISCELLANEOUS) IMPLANT
DRAPE LAPAROTOMY 100X72 PEDS (DRAPES) ×1 IMPLANT
DRAPE SHEET LG 3/4 BI-LAMINATE (DRAPES) IMPLANT
DRAPE UTILITY XL STRL (DRAPES) ×1 IMPLANT
ELECT NDL BLADE 2-5/6 (NEEDLE) ×1 IMPLANT
ELECT NEEDLE BLADE 2-5/6 (NEEDLE) ×1
GAUZE 4X4 16PLY ~~LOC~~+RFID DBL (SPONGE) ×1 IMPLANT
GAUZE PAD ABD 8X10 STRL (GAUZE/BANDAGES/DRESSINGS) ×1 IMPLANT
GAUZE SPONGE 4X4 12PLY STRL (GAUZE/BANDAGES/DRESSINGS) ×1 IMPLANT
GLOVE BIO SURGEON STRL SZ7.5 (GLOVE) ×1 IMPLANT
GLOVE INDICATOR 8.0 STRL GRN (GLOVE) ×1 IMPLANT
GOWN STRL REUS W/TWL XL LVL3 (GOWN DISPOSABLE) ×1 IMPLANT
HYDROGEN PEROXIDE 16OZ (MISCELLANEOUS) IMPLANT
IV CATH 14GX2 1/4 (CATHETERS) IMPLANT
IV CATH 18G SAFETY (IV SOLUTION) IMPLANT
KIT SIGMOIDOSCOPE (SET/KITS/TRAYS/PACK) IMPLANT
KIT TURNOVER CYSTO (KITS) ×1 IMPLANT
LEGGING LITHOTOMY PAIR STRL (DRAPES) IMPLANT
LOOP VASCLR MAXI BLUE 18IN ST (MISCELLANEOUS) IMPLANT
NDL HYPO 22X1.5 SAFETY MO (MISCELLANEOUS) ×1 IMPLANT
NDL HYPO 25X1 1.5 SAFETY (NEEDLE) IMPLANT
NDL SAFETY ECLIPSE 18X1.5 (NEEDLE) IMPLANT
NEEDLE HYPO 22X1.5 SAFETY MO (MISCELLANEOUS) ×1
NEEDLE HYPO 25X1 1.5 SAFETY (NEEDLE)
NS IRRIG 500ML POUR BTL (IV SOLUTION) ×1 IMPLANT
PACK BASIN DAY SURGERY FS (CUSTOM PROCEDURE TRAY) ×1 IMPLANT
PENCIL SMOKE EVACUATOR (MISCELLANEOUS) ×1 IMPLANT
RETRACTOR RING URO 16.6X16.6 (MISCELLANEOUS) ×1 IMPLANT
RETRACTOR STAY HOOK 5MM (MISCELLANEOUS) ×1 IMPLANT
SLEEVE SCD COMPRESS KNEE MED (STOCKING) ×1 IMPLANT
SPIKE FLUID TRANSFER (MISCELLANEOUS) ×1 IMPLANT
SPONGE HEMORRHOID 8X3CM (HEMOSTASIS) IMPLANT
SPONGE SURGIFOAM ABS GEL 12-7 (HEMOSTASIS) IMPLANT
SUCTION TUBE FRAZIER 10FR DISP (SUCTIONS) ×1 IMPLANT
SUT CHROMIC 2 0 SH (SUTURE) IMPLANT
SUT CHROMIC 3 0 SH 27 (SUTURE) IMPLANT
SUT MNCRL AB 4-0 PS2 18 (SUTURE) IMPLANT
SUT SILK 0 TIES 10X30 (SUTURE) ×1 IMPLANT
SUT SILK 2 0 (SUTURE)
SUT SILK 2 0 SH (SUTURE) ×1 IMPLANT
SUT SILK 2-0 18XBRD TIE 12 (SUTURE) IMPLANT
SUT VIC AB 2-0 SH 27 (SUTURE) ×1
SUT VIC AB 2-0 SH 27XBRD (SUTURE) IMPLANT
SUT VIC AB 2-0 UR6 27 (SUTURE) IMPLANT
SUT VIC AB 3-0 SH 18 (SUTURE) IMPLANT
SUT VIC AB 3-0 SH 27 (SUTURE) ×1
SUT VIC AB 3-0 SH 27X BRD (SUTURE) IMPLANT
SUT VIC AB 3-0 SH 27XBRD (SUTURE) IMPLANT
SUT VICRYL 0 UR6 27IN ABS (SUTURE) IMPLANT
SUT VICRYL 2 0 18 UND BR (SUTURE) IMPLANT
SYR 20ML LL LF (SYRINGE) IMPLANT
SYR BULB IRRIG 60ML STRL (SYRINGE) ×1 IMPLANT
SYR CONTROL 10ML LL (SYRINGE) ×1 IMPLANT
SYR TB 1ML LL NO SAFETY (SYRINGE) IMPLANT
TOWEL OR 17X24 6PK STRL BLUE (TOWEL DISPOSABLE) ×1 IMPLANT
TRAY DSU PREP LF (CUSTOM PROCEDURE TRAY) ×1 IMPLANT
TUBE CONNECTING 12X1/4 (SUCTIONS) ×1 IMPLANT
VASCULAR TIE MAXI BLUE 18IN ST (MISCELLANEOUS)
YANKAUER SUCT BULB TIP NO VENT (SUCTIONS) ×1 IMPLANT

## 2023-05-24 NOTE — Op Note (Signed)
05/24/2023  9:08 AM  PATIENT:  Jill [name]  57 y.o. female  Patient Care Team: Mirna Mires, MD as PCP - General (Family Medicine) Wyline Mood, Dorothe Pea, MD as PCP - Cardiology (Cardiology) Gaynelle Adu, MD as Consulting Physician [facility]) Branch, Dorothe Pea, MD as Consulting Physician (Cardiology) Karie Soda, MD as Consulting Physician (General Surgery)  PRE-OPERATIVE DIAGNOSIS:  Transsphincteric anal fistula  POST-OPERATIVE DIAGNOSIS:  Same  PROCEDURE:  Surgical treatment of transsphincteric anal fistula - Ligation of Intersphincteric Fistulous Tract (LIFT); anorectal exam under anesthesia  SURGEON:  Surgeon(s): Andria Meuse, MD  ASSISTANT: OR Staff   ANESTHESIA:   local and general  SPECIMEN:  No Specimen  DISPOSITION OF SPECIMEN:  N/A  COUNTS:  Sponge, needle, and instrument counts were reported correct x2 at conclusion.  EBL: 5 mL  Drains: None  PLAN OF CARE: Discharge to home after PACU  PATIENT DISPOSITION:  PACU - hemodynamically stable.  OR FINDINGS: Right posterior transsphincteric anal fistula.  Healthy appearing anal canal.  No active inflammation.  No evident false tracts or blind pouches extending off the primary fistula.  Ligation of the intersphincteric fistula tract was carried out uneventfully.  DESCRIPTION: The patient was identified in the preoperative holding area and taken to the OR. SCDs were applied. She then underwent general endotracheal anesthesia without difficulty. The patient was then rolled onto the OR table in the prone jackknife position. Pressure points were then evaluated and padded. Benzoin was applied to the buttocks and they were gently taped apart.  She was then prepped and draped in usual sterile fashion.  A surgical timeout was performed indicating the correct patient, procedure, and positioning.  A perianal block was then created using a dilute mixture of 0.25% Marcaine with epinephrine and Exparel.  After  ascertaining an appropriate level of anesthesia had been achieved, a well lubricated digital rectal exam was performed. This demonstrated no palpable abnormalities.  A Hill-Ferguson anoscope was into the anal canal and circumferential inspection demonstrated healthy appearing anoderm with indwelling blue vessel loop seton.  There is no inflammation noted within the anal canal.  Externally, aside from the external opening on the right posterior anal margin, the skin is normal. There is no fluctuance, purulence or skin break down.  The intersphincteric groove is then palpated.  We did exchange the seton for a semirigid fistula probe.  Overlying the intersphincteric groove, a curvilinear incision was then created.  Subcutaneous tissue was then carefully divided electrocautery.  A [place] retractor was then placed.  We then developed our plane bluntly all the way around the fistula tract maintaining the plane within the intersphincteric groove.  No sphincter muscle is divided during this dissection.  Were able to circumferentially dissect the fistulous tract within the intersphincteric plane.  The fistula is then doubly clamped and ligated between clamps.  Each respective end of the fistula is then suture-ligated using multiple 2-0 Vicryl figure-of-eight sutures, 3 on each side.  There is a good appearing closure.  The internal opening is then carefully probed with a crypt hook.  The internal opening has been successfully excluded.  The external opening is then carefully cannulated with our fistula probe.  It has also been successfully excluded without any evident gaps in the closure.  The wound is then irrigated.  Hemostasis is verified.  The wound was then closed in layers using 3-0 Vicryl sutures to reapproximate the intersphincteric plane.  All sponge, needle, and instrument counts were reported correct.  The skin was then  closed using 3-0 chromic sutures in interrupted fashion.  The wound is then washed and  dried.  A dressing consisting of 4 x 4's, ABD, mesh underwear was ultimately placed.  She was rolled back onto a stretcher, awakened from anesthesia, extubated, and transported to the recovery room in satisfactory condition.  DISPOSITION: PACU in satisfactory condition.

## 2023-05-24 NOTE — Anesthesia Procedure Notes (Signed)
Procedure Name: Intubation Date/Time: 05/24/2023 7:50 AM  Performed by: Briant Sites, CRNAPre-anesthesia Checklist: Patient identified, Emergency Drugs available, Suction available and Patient being monitored Patient Re-evaluated:Patient Re-evaluated prior to induction Oxygen Delivery Method: Circle system utilized Preoxygenation: Pre-oxygenation with 100% oxygen Induction Type: IV induction Ventilation: Mask ventilation without difficulty Laryngoscope Size: Mac and 3 Grade View: Grade I Tube type: Oral Tube size: 7.0 mm Number of attempts: 1 Airway Equipment and Method: Stylet and Oral airway Placement Confirmation: ETT inserted through vocal cords under direct vision, positive ETCO2 and breath sounds checked- equal and bilateral Secured at: 21 cm Tube secured with: Tape Dental Injury: Teeth and Oropharynx as per pre-operative assessment

## 2023-05-24 NOTE — Discharge Instructions (Addendum)
PLEASE DO NOT TAKE TYLENOL UNTIL AFTER 12p TODAY.      ANORECTAL SURGERY: POST OP INSTRUCTIONS  DIET: Follow a light bland diet the first 24 hours after arrival home, such as soup, liquids, crackers, etc.  Be sure to include lots of fluids daily.  Avoid fast food or heavy meals as your are more likely to get nauseated.  Eat a low fat diet the next few days after surgery.   Some bleeding with bowel movements is expected for the first couple of days but this should stop in between bowel movements  Take your usually prescribed home medications unless otherwise directed. No foreign bodies per rectum for the next 3 months (enemas, etc)  PAIN CONTROL: It is helpful to take an over-the-counter pain medication regularly for the first few days/weeks.  Choose from the following that works best for you: Ibuprofen (Advil, etc) Three 200mg  tabs every 6 hours as needed. Acetaminophen (Tylenol, etc) 500-650mg  every 6 hours as needed NOTE: You may take both of these medications together - most patients find it most helpful when alternating between the two (i.e. Ibuprofen at 6am, tylenol at 9am, ibuprofen at 12pm ..Marland Kitchen) A  prescription for pain medication may have been prescribed for you at discharge.  Take your pain medication as prescribed.  If you are having problems/concerns with the prescription medicine, please call us for further advice.  Avoid getting constipated.  Between the surgery and the pain medications, it is common to experience some constipation.  Increasing fluid intake (64oz of water per day) and taking a fiber supplement (such as Metamucil, Citrucel, FiberCon) 1-2 times a day regularly will usually help prevent this problem from occurring.  Take Miralax (over the counter) 1-2x/day while taking a narcotic pain medication. If no bowel movement after 48hours, you may additionally take a laxative like a bottle of Milk of Magnesia which can be purchased over the counter. Avoid enemas.   Watch out  for diarrhea.  If you have many loose bowel movements, simplify your diet to bland foods.  Stop any stool softeners and decrease your fiber supplement. If this worsens or does not improve, please call us.  Wash / shower every day.  If you were discharged with a dressing, you may remove this the day after your surgery. You may shower normally, getting soap/water on your wound, particularly after bowel movements.  Soaking in a warm bath filled a couple inches ("Sitz bath") is a great way to clean the area after a bowel movement and many patients find it is a way to soothe the area.  ACTIVITIES as tolerated:   You may resume regular (light) daily activities beginning the next day--such as daily self-care, walking, climbing stairs--gradually increasing activities as tolerated.  If you can walk 30 minutes without difficulty, it is safe to try more intense activity such as jogging, treadmill, bicycling, low-impact aerobics, etc. Refrain from any heavy lifting or straining for the first 2 weeks after your procedure, particularly if your surgery was for hemorrhoids. Avoid activities that make your pain worse You may drive when you are no longer taking prescription pain medication, you can comfortably wear a seatbelt, and you can safely maneuver your car and apply brakes.  FOLLOW UP in our office Please call [facility] at [phone] to set up an appointment to see your surgeon in the office for a follow-up appointment approximately 2 weeks after your surgery. Make sure that you call for this appointment the day you arrive home to insure  a convenient appointment time.  9. If you have disability or family leave forms that need to be completed, you may have them completed by your primary care physician's office; for return to work instructions, please ask our office staff and they will be happy to assist you in obtaining this documentation   When to call us [phone]: Poor pain control Reactions / problems  with new medications (rash/itching, etc)  Fever over 101.5 F (38.5 C) Inability to urinate Nausea/vomiting Worsening swelling or bruising Continued bleeding from incision. Increased pain, redness, or drainage from the incision  The clinic staff is available to answer your questions during regular business hours (8:30am-5pm).  Please don't hesitate to call and ask to speak to one of our nurses for clinical concerns.   A surgeon from [facility] is always on call at the hospitals   If you have a medical emergency, go to the nearest emergency room or call 911.   [facility] [address], [place], [place]  [address] MAIN: [phone] FAX: [phone] [email or web address]  Post Anesthesia Home Care Instructions  Activity: Get plenty of rest for the remainder of the day. A responsible adult should stay with you for 24 hours following the procedure.  For the next 24 hours, DO NOT: -Drive a car -Advertising copywriter -Drink alcoholic beverages -Take any medication unless instructed by your physician -Make any legal decisions or sign important papers.  Meals: Start with liquid foods such as gelatin or soup. Progress to regular foods as tolerated. Avoid greasy, spicy, heavy foods. If nausea and/or vomiting occur, drink only clear liquids until the nausea and/or vomiting subsides. Call your physician if vomiting continues.  Special Instructions/Symptoms: Your throat may feel dry or sore from the anesthesia or the breathing tube placed in your throat during surgery. If this causes discomfort, gargle with warm salt water. The discomfort should disappear within 24 hours.  If you had a scopolamine patch placed behind your ear for the management of post- operative nausea and/or vomiting:  1. The medication in the patch is effective for 72 hours, after which it should be removed.  Wrap patch in a tissue and discard in  the trash. Wash hands thoroughly with soap and water. 2. You may remove the patch earlier than 72 hours if you experience unpleasant side effects which may include dry mouth, dizziness or visual disturbances. 3. Avoid touching the patch. Wash your hands with soap and water after contact with the patch.  exInformation for Discharge Teaching: EXPAREL (bupivacaine liposome injectable suspension)   Your surgeon gave you EXPAREL(bupivacaine) in your surgical incision to help control your pain after surgery.  EXPAREL is a local anesthetic that provides pain relief by numbing the tissue around the surgical site. EXPAREL is designed to release pain medication over time and can control pain for up to 72 hours. Depending on how you respond to EXPAREL, you may require less pain medication during your recovery.  Possible side effects: Temporary loss of sensation or ability to move in the area where bupivacaine was injected. Nausea, vomiting, constipation Rarely, numbness and tingling in your mouth or lips, lightheadedness, or anxiety may occur. Call your doctor right away if you think you may be experiencing any of these sensations, or if you have other questions regarding possible side effects.  Follow all other discharge instructions given to you by your surgeon or nurse. Eat a healthy diet and drink plenty of water or other  fluids.  If you return to the hospital for any reason within 96 hours following the administration of EXPAREL, please inform your health care providers.

## 2023-05-24 NOTE — Anesthesia Postprocedure Evaluation (Signed)
Anesthesia Post Note  Patient: Jill [name]  Procedure(s) Performed: LIGATION OF INTERSPHINCTERIC FISTULOUS TRACT ANORECTAL EXAM UNDER ANESTHESIA     Patient location during evaluation: PACU Anesthesia Type: General Level of consciousness: awake and alert Pain management: pain level controlled Vital Signs Assessment: post-procedure vital signs reviewed and stable Respiratory status: spontaneous breathing, nonlabored ventilation, respiratory function stable and patient connected to nasal cannula oxygen Cardiovascular status: blood pressure returned to baseline and stable Postop Assessment: no apparent nausea or vomiting Anesthetic complications: no   No notable events documented.  Last Vitals:  Vitals:   05/24/23 0945 05/24/23 1015  BP: (!) 143/64 (!) 146/70  Pulse: 66 65  Resp: 15 16  Temp:  36.6 C  SpO2: 99% 100%    Last Pain:  Vitals:   05/24/23 1015  TempSrc:   PainSc: 0-No pain                 Trevor Iha

## 2023-05-24 NOTE — H&P (Addendum)
CC: Here today for surgery  HPI: Jill [name] is an 57 y.o. female with history of DM, DVT, HLD, HTN, Seizure, CVA, whom is seen in the office today as a referral by Dr. Lavell Anchors for evaluation of possible anal fistula.  She has previously seen one of our PAs, [facility], for evaluation of a perianal abscess. This was back on 11/23/2022.  She has a history of a sleeve gastrectomy with Dr. Andrey Campanile 04/10/2016.  Last colonoscopy 11/18/2021, Dr. Levon Hedger: -Preparation of the colon was inadequate - Submucosal mass in the proximal ascending colon, possibly external compression. Biopsied.  Pathology: Benign.  Reports a history now of at least 2 to 3 years of perianal swelling and then drainage in the exact same location-right buttock.  She has previously seen one of my partners, Dr. Michaell Cowing, back in 2021. He had scheduled her for a planned procedure to evaluate her perianal wound but she suspected to be a fistula that time. She opted not to proceed with scheduling at that time and instead monitor her symptoms. They have persisted and now she returns to discuss potentially doing something more.  For the last month, she has had 3 discrete episodes where there has been significant perianal pain and swelling at this right sided location with subsequent drainage and alleviation of her symptoms. The frequency of these events has become more frequent over the last couple of years. She denies any prior anorectal surgeries or procedures. She denies any known history of hemorrhoids or anal fissures/tears.  She denies any history of incontinence to gas, liquid, or solid stool. Reports good control. Reports she can get to the bathroom that any difficulty.  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  Associated perianal abscess, 1 x 1 cm.  Suspect this will likely be amenable to a LIFT procedure down the road   Returns today for planned LIFT procedure. She denies any changes in health or health history since we met in the office. No new medications/allergies. She states she is ready for surgery today. No new perianal pains, abscesses or complaints therein   PMH: DM, DVT, HLD, HTN, Seizure, CVA - on aspirin and plavix  PSH: Sleeve gastrectomy, 2017  FHx: Denies any known family history of colorectal, breast, endometrial or ovarian cancer  Social Hx: Smokes 10 cigarettes per day. Denies use of EtOH/illicit drug.   Past Medical History:  Diagnosis Date   Anemia    Anxiety    Back pain    complains when laying on hard flat surface   Chronic kidney disease    appt. /w urology 11/10/2014- for a cyst seen on [place]. evaluated was told it was nothing.   Diabetes mellitus    Diabetes II, oral and GLP-1.   DVT (deep venous thrombosis) (HCC)    right leg '09 or '10   Fibroids    GERD (gastroesophageal reflux disease)    Heart murmur    History of kidney stones    History of stress test    referred for cardiac cath- done here at [facility]- 09/2014   Hypercholesterolemia    Hypertension    Neuromuscular disorder (HCC)    neuropathy   PONV (postoperative nausea and vomiting)    Seizures (HCC)    02/01/23- last  seizure approximately 2015;   Sleep apnea    using cpap as of 05/23/23   Stroke [facility])    '09-had blockage in brain, too deep to operate"occ left lower extremity fatique, occ. 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    Past Surgical History:  Procedure Laterality Date   APPENDECTOMY     BIOPSY  11/18/2021   Procedure: BIOPSY;  Surgeon: Dolores Frame, MD;  [facility];  Service: Gastroenterology;;  random colon;    BIOPSY  12/27/2021   Procedure: BIOPSY;  Surgeon: Dolores Frame, MD;  [facility];  Service: Gastroenterology;;   CARDIAC CATHETERIZATION     CESAREAN SECTION  1986 & 1988   x2   CHOLECYSTECTOMY     open"gallstones"   COLONOSCOPY WITH PROPOFOL N/A 11/18/2021   Procedure: COLONOSCOPY WITH PROPOFOL;  Surgeon: Dolores Frame, MD;  [facility];  Service: Gastroenterology;  Laterality: N/A;  1215   DILATATION & CURETTAGE/HYSTEROSCOPY WITH MYOSURE N/A 05/10/2017   Procedure: DILATATION & CURETTAGE/HYSTEROSCOPY WITH MYOSURE;  Surgeon: Maxie Better, MD;  Location: [facility];  Service: Gynecology;  Laterality: N/A;   ESOPHAGOGASTRODUODENOSCOPY (EGD) WITH PROPOFOL N/A 12/27/2021   Procedure: ESOPHAGOGASTRODUODENOSCOPY (EGD) WITH PROPOFOL;  Surgeon: Dolores Frame, MD;  [facility];  Service: Gastroenterology;  Laterality: N/A;  100   ESOPHAGOGASTRODUODENOSCOPY (EGD) WITH PROPOFOL N/A 01/13/2022   Procedure: ESOPHAGOGASTRODUODENOSCOPY (EGD) WITH PROPOFOL;  Surgeon: Dolores Frame, MD;  [facility];  Service: Gastroenterology;  Laterality: N/A;  235, per Soledad Gerlach pt knows to arrive at 6:15   HEMOSTASIS CLIP PLACEMENT  01/13/2022   Procedure: HEMOSTASIS CLIP PLACEMENT;  Surgeon: Dolores Frame, MD;  [facility];  Service: Gastroenterology;;   INCISION AND DRAINAGE ABSCESS  02/23/2023   Procedure: INCISION AND DRAINAGE OF PERI RECTAL ABSCESS;  Surgeon: Andria Meuse, MD;  Location: [facility];  Service: General;;   INTERVENTIONAL RADIOLOGY PROCEDURE     CT angiography /neck -Dr. Corliss Skains.   IR ANGIO INTRA EXTRACRAN SEL COM CAROTID INNOMINATE BILAT MOD SED  02/22/2018   IR ANGIO INTRA EXTRACRAN SEL COM CAROTID INNOMINATE BILAT MOD SED  03/08/2022   IR ANGIO VERTEBRAL SEL VERTEBRAL BILAT MOD SED  02/22/2018   IR ANGIO VERTEBRAL SEL VERTEBRAL UNI R MOD SED  03/08/2022   IR  GENERIC HISTORICAL  08/03/2016   IR RADIOLOGIST EVAL & MGMT 08/03/2016 [facility]   IR RADIOLOGIST EVAL & MGMT  01/03/2022   LAPAROSCOPIC GASTRIC SLEEVE RESECTION N/A 04/10/2016   Procedure: LAPAROSCOPIC GASTRIC SLEEVE RESECTION WITH UPPER ENDO;  Surgeon: Gaynelle Adu, MD;  Location: [facility];  Service: General;  Laterality: N/A;   LEFT HEART CATHETERIZATION WITH CORONARY ANGIOGRAM N/A 09/28/2014   Procedure: LEFT HEART CATHETERIZATION WITH CORONARY ANGIOGRAM;  Surgeon: Corky Crafts, MD;  Location: [facility] CATH LAB;  Service: Cardiovascular;  Laterality: N/A;   NEPHROLITHOTOMY Right 02/01/2022   Procedure: RIGHT NEPHROLITHOTOMY PERCUTANEOUS WITH SURGEON ACCESS;  Surgeon: Crist Fat, MD;  Location: [facility];  Service: Urology;  Laterality: Right;   NEPHROLITHOTOMY Left 02/15/2022   Procedure: LEFT NEPHROLITHOTOMY PERCUTANEOUS WITH SURGEON ACCESS, REMOVAL OF RIGHT URETERAL STENT;  Surgeon: Crist Fat, MD;  Location: [facility];  Service: Urology;  Laterality: Left;  195 MINUTES   PLACEMENT OF SETON N/A 02/23/2023   Procedure: SURGICAL TREATMENT OF ANAL FISTULA-TRANSSPHINTERIL WITH PLACEMENT OF  SETON;  Surgeon: Andria Meuse, MD;  [facility];  Service: General;  Laterality: N/A;   POLYPECTOMY  01/13/2022   Procedure: POLYPECTOMY;  Surgeon: Dolores Frame, MD;  [facility];  Service: Gastroenterology;;   RADIOLOGY WITH ANESTHESIA N/A 10/14/2014   Procedure: Mingo Amber;  Surgeon: Julieanne Cotton, MD;  Location: [facility];  Service: Radiology;  Laterality: N/A;   TUBAL LIGATION      Family History  Problem Relation Age of Onset   Cataracts Mother    Kidney disease Mother        dialysis for 26 years   Heart attack Mother        64's   Diabetes Father    Cataracts Father    Heart failure Father    Heart disease Father    Heart attack Father        39's   Stroke Paternal Grandmother     Social:  reports that she has been smoking  cigarettes. She started smoking about 47 years ago. She has a 12 pack-year smoking history. She has been exposed to tobacco smoke. She has never used smokeless tobacco. She reports that she does not drink alcohol and does not use drugs.  Allergies:  Allergies  Allergen Reactions   Dilaudid [Hydromorphone Hcl] Nausea And Vomiting   Crab Extract Swelling    Crab Cakes   Crab [Shellfish Allergy] Swelling   Hydrocodone     "Don't like the way it feels"   Other Other (See Comments)    "pt is a difficult IV stick, prefers IV team paged for IV starts"    Medications: I have reviewed the patient's current medications.  Results for orders placed or performed during the hospital encounter of 05/24/23 (from the past 48 hour(s))  I-STAT, chem 8     Status: Abnormal   Collection Time: 05/24/23  6:20 AM  Result Value Ref Range   Sodium 142 135 - 145 mmol/L   Potassium 4.4 3.5 - 5.1 mmol/L   Chloride 106 98 - 111 mmol/L   BUN 23 (H) 6 - 20 mg/dL   Creatinine, Ser 6.04 (H) 0.44 - 1.00 mg/dL   Glucose, Bld 540 (H) 70 - 99 mg/dL    Comment: Glucose reference range applies only to samples taken after fasting for at least 8 hours.   Calcium, Ion 1.48 (H) 1.15 - 1.40 mmol/L   TCO2 25 22 - 32 mmol/L   Hemoglobin 13.6 12.0 - 15.0 g/dL   HCT 98.1 19.1 - 47.8 %    No results found.   PE Blood pressure (!) 165/74, pulse 61, temperature 97.9 F (36.6 C), temperature source Oral, resp. rate 17, height 5' 6.5" (1.689 m), weight 100.5 kg, last menstrual period 12/10/2014, SpO2 100%. Constitutional: NAD; conversant Eyes: Moist conjunctiva; no lid lag; anicteric Lungs: Normal respiratory effort CV: RRR GI: Abd soft, NT/ND Psychiatric: Appropriate affect  Results for orders placed or performed during the hospital encounter of 05/24/23 (from the past 48 hour(s))  I-STAT, chem 8     Status: Abnormal   Collection Time: 05/24/23  6:20 AM  Result Value Ref Range   Sodium 142 135 - 145 mmol/L    Potassium 4.4 3.5 - 5.1 mmol/L   Chloride 106 98 - 111 mmol/L   BUN 23 (H) 6 - 20 mg/dL   Creatinine, Ser 2.95 (H) 0.44 - 1.00 mg/dL   Glucose, Bld 621 (H) 70 - 99 mg/dL    Comment: Glucose  reference range applies only to samples taken after fasting for at least 8 hours.   Calcium, Ion 1.48 (H) 1.15 - 1.40 mmol/L   TCO2 25 22 - 32 mmol/L   Hemoglobin 13.6 12.0 - 15.0 g/dL   HCT 19.1 47.8 - 29.5 %    No results found.  A/P: Jill [name] is an 57 y.o. 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 Wyline Mood and Dr. Corliss Skains for surgery and to ensure it is okay for her to come off Plavix perioperatively.  -The anatomy and physiology of the anal canal was discussed with the patient with associated pictures. The pathophysiology of anal abscess and fistula was discussed at length with associated pictures and illustrations - using the ASCRS trifold -We have reviewed options going forward including further observation vs surgery -surgical treatment/control of anal fistula (intersphincteric, transsphincteric) - fistulotomy versus ligation of intersphincteric fistulous tract (LIFT) based on intraoperative findings; anorectal exam under anesthesia -The planned procedure, material risks (including, but not limited to, pain, bleeding, infection, scarring, need for blood transfusion, damage to anal sphincter, incontinence of gas and/or stool, need for additional procedures, anal stenosis, rare cases of pelvic sepsis which in severe cases may require things like a colostomy, recurrence rates of 20-30%, pneumonia, heart attack, stroke, death) benefits and alternatives to surgery were discussed at length. I noted a good probability that the procedure would help improve their symptoms. The patient's questions  were answered to her satisfaction, she voiced understanding and elected to proceed with surgery. Additionally, we discussed typical postoperative expectations and the recovery process.   Marin Olp, MD [facility], [facility]

## 2023-05-24 NOTE — Transfer of Care (Signed)
Immediate Anesthesia Transfer of Care Note  Patient: Jill [name]  Procedure(s) Performed: LIGATION OF INTERSPHINCTERIC FISTULOUS TRACT ANORECTAL EXAM UNDER ANESTHESIA  Patient Location: PACU  Anesthesia Type:General  Level of Consciousness: sedated  Airway & Oxygen Therapy: Patient Spontanous Breathing and Patient connected to nasal cannula oxygen  Post-op Assessment: Report given to RN  Post vital signs: Reviewed and stable  Last Vitals:  Vitals Value Taken Time  BP 141/65 05/24/23 0913  Temp    Pulse 66 05/24/23 0915  Resp 16 05/24/23 0915  SpO2 100 % 05/24/23 0915  Vitals shown include unfiled device data.  Last Pain:  Vitals:   05/24/23 0559  TempSrc: Oral  PainSc: 0-No pain      Patients Stated Pain Goal: 4 (05/24/23 0559)  Complications: No notable events documented.

## 2023-05-28 ENCOUNTER — Encounter (HOSPITAL_BASED_OUTPATIENT_CLINIC_OR_DEPARTMENT_OTHER): Payer: Self-pay | Admitting: Surgery

## 2023-06-14 ENCOUNTER — Telehealth (HOSPITAL_COMMUNITY): Payer: Self-pay

## 2023-06-14 ENCOUNTER — Other Ambulatory Visit (HOSPITAL_COMMUNITY): Payer: Self-pay | Admitting: Interventional Radiology

## 2023-06-14 DIAGNOSIS — I771 Stricture of artery: Secondary | ICD-10-CM

## 2023-06-14 NOTE — Telephone Encounter (Signed)
Called to schedule mra, no answer, left vm. AB

## 2023-06-18 NOTE — Progress Notes (Shared)
[facility] - Clinic Note  06/25/2023   CHIEF COMPLAINT Patient presents for No chief complaint on file.  HISTORY OF PRESENT ILLNESS: Jill [name] is a 57 y.o. female who presents to the clinic today for:     Referring physician: Pecolia Ades, MD [address] [place],  [place] [address]  HISTORICAL INFORMATION:  Selected notes from the MEDICAL RECORD NUMBER Referred by Dr. Ilsa Iha for diabetic retinopathy eval -- new subhyaloid heme OD LEE:  Ocular Hx- PMH-   CURRENT MEDICATIONS: No current outpatient medications on file. (Ophthalmic Drugs)   No current facility-administered medications for this visit. (Ophthalmic Drugs)   Current Outpatient Medications (Other)  Medication Sig   acetaminophen (TYLENOL) 500 MG tablet Take 500-1,000 mg by mouth every 8 (eight) hours as needed for moderate pain or headache.    aspirin EC 81 MG tablet Take 81 mg by mouth every morning.   bismuth subsalicylate (PEPTO BISMOL) 262 MG chewable tablet Chew 524 mg by mouth 3 (three) times daily as needed (stomach pain.).   Calcium-Phosphorus-Vitamin D (CITRACAL +D3 PO) Take 2 tablets by mouth in the morning and at bedtime.   clopidogrel (PLAVIX) 75 MG tablet Take 75 mg by mouth in the morning.   Cyanocobalamin (VITAMIN B-12) 5000 MCG SUBL Place 5,000 mcg under the tongue 3 (three) times a week.   docusate sodium (COLACE) 100 MG capsule Take 100 mg by mouth in the morning.   ezetimibe (ZETIA) 10 MG tablet Take 1 tablet (10 mg total) by mouth daily.   felodipine (PLENDIL) 5 MG 24 hr tablet Take 5 mg by mouth in the morning.   furosemide (LASIX) 20 MG tablet Take 1 tablet (20 mg total) by mouth every other day. (Patient taking differently: Take 20 mg by mouth every other day. Taking PRN for swelling)   gabapentin (NEURONTIN) 300 MG capsule Take 300 mg by mouth 2 (two) times daily.   lamoTRIgine (LAMICTAL) 100 MG tablet Take 100 mg by mouth every morning.    levETIRAcetam  (KEPPRA) 500 MG tablet Take 250 mg by mouth 2 (two) times daily.   linaclotide (LINZESS) 145 MCG CAPS capsule Take 145 mcg by mouth daily before breakfast.   LORazepam (ATIVAN) 0.5 MG tablet Take 0.5 mg by mouth daily as needed for anxiety.   metFORMIN (GLUCOPHAGE) 500 MG tablet Take 500 mg by mouth 2 (two) times daily.   metoprolol succinate (TOPROL-XL) 25 MG 24 hr tablet Take 1 tablet by mouth daily   Multiple Vitamins-Minerals (BARIATRIC MULTIVITAMINS/IRON PO) Take 1 tablet by mouth every evening.   olmesartan (BENICAR) 40 MG tablet Take 40 mg by mouth every morning.    OZEMPIC, 1 MG/DOSE, 2 MG/1.5ML SOPN Inject 1 mg into the skin every 30 (thirty) days.   pantoprazole (PROTONIX) 40 MG tablet Take 1 tablet (40 mg total) by mouth 2 (two) times daily.   pioglitazone (ACTOS) 30 MG tablet Take 30 mg by mouth every morning.   potassium chloride (KLOR-CON M) 10 MEQ tablet TAKE 2 TABLETS BY MOUTH DAILY.   rosuvastatin (CRESTOR) 40 MG tablet Take 1 tablet (40 mg total) by mouth at bedtime.   No current facility-administered medications for this visit. (Other)   REVIEW OF SYSTEMS:     ALLERGIES Allergies  Allergen Reactions   Dilaudid [Hydromorphone Hcl] Nausea And Vomiting   Crab Extract Swelling    Crab Cakes   Crab [Shellfish Allergy] Swelling   Hydrocodone     "Don't like the way it  feels"   Other Other (See Comments)    "pt is a difficult IV stick, prefers IV team paged for IV starts"   PAST MEDICAL HISTORY Past Medical History:  Diagnosis Date   Anemia    Anxiety    Back pain    complains when laying on hard flat surface   Chronic kidney disease    appt. /w urology 11/10/2014- for a cyst seen on [place]. evaluated was told it was nothing.   Diabetes mellitus    Diabetes II, oral and GLP-1.   DVT (deep venous thrombosis) (HCC)    right leg '09 or '10   Fibroids    GERD (gastroesophageal reflux disease)    Heart murmur    History of kidney stones    History of stress test     referred for cardiac cath- done here at [facility]- 09/2014   Hypercholesterolemia    Hypertension    Neuromuscular disorder (HCC)    neuropathy   PONV (postoperative nausea and vomiting)    Seizures (HCC)    02/01/23- last seizure approximately 2015;   Sleep apnea    using cpap as of 05/23/23   Stroke [facility])    '09-had blockage in brain, too deep to operate"occ left lower extremity fatique, occ. 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   Past Surgical History:  Procedure Laterality Date   APPENDECTOMY     BIOPSY  11/18/2021   Procedure: BIOPSY;  Surgeon: Dolores Frame, MD;  [facility]: [facility];  Service: Gastroenterology;;  random colon;   BIOPSY  12/27/2021   Procedure: BIOPSY;  Surgeon: Dolores Frame, MD;  [facility]: [facility];  Service: Gastroenterology;;   CARDIAC CATHETERIZATION     CESAREAN SECTION  1986 & 1988   x2   CHOLECYSTECTOMY     open"gallstones"   COLONOSCOPY WITH PROPOFOL N/A 11/18/2021   Procedure: COLONOSCOPY WITH PROPOFOL;  Surgeon: Dolores Frame, MD;  [facility]: [facility];  Service: Gastroenterology;  Laterality: N/A;  1215   DILATATION & CURETTAGE/HYSTEROSCOPY WITH MYOSURE N/A 05/10/2017   Procedure: DILATATION & CURETTAGE/HYSTEROSCOPY WITH MYOSURE;  Surgeon: Maxie Better, MD;  Location: [facility];  Service: Gynecology;  Laterality: N/A;   ESOPHAGOGASTRODUODENOSCOPY (EGD) WITH PROPOFOL N/A 12/27/2021   Procedure: ESOPHAGOGASTRODUODENOSCOPY (EGD) WITH PROPOFOL;  Surgeon: Dolores Frame, MD;  [facility]: [facility];  Service: Gastroenterology;  Laterality: N/A;  100   ESOPHAGOGASTRODUODENOSCOPY (EGD) WITH PROPOFOL N/A 01/13/2022   Procedure: ESOPHAGOGASTRODUODENOSCOPY (EGD) WITH PROPOFOL;  Surgeon: Dolores Frame, MD;  [facility]: [facility];  Service: Gastroenterology;  Laterality: N/A;  235, per Soledad Gerlach pt knows to  arrive at 6:15   HEMOSTASIS CLIP PLACEMENT  01/13/2022   Procedure: HEMOSTASIS CLIP PLACEMENT;  Surgeon: Dolores Frame, MD;  [facility];  Service: Gastroenterology;;   INCISION AND DRAINAGE ABSCESS  02/23/2023   Procedure: INCISION AND DRAINAGE OF PERI RECTAL ABSCESS;  Surgeon: Andria Meuse, MD;  Location: [facility] [place];  Service: General;;   INTERVENTIONAL RADIOLOGY PROCEDURE     CT angiography /neck -Dr. Corliss Skains.   IR ANGIO INTRA EXTRACRAN SEL COM CAROTID INNOMINATE BILAT MOD SED  02/22/2018   IR ANGIO INTRA EXTRACRAN SEL COM CAROTID INNOMINATE BILAT MOD SED  03/08/2022   IR ANGIO VERTEBRAL SEL VERTEBRAL BILAT MOD SED  02/22/2018   IR ANGIO VERTEBRAL SEL VERTEBRAL UNI R MOD SED  03/08/2022  IR GENERIC HISTORICAL  08/03/2016   IR RADIOLOGIST EVAL & MGMT 08/03/2016 [facility]   IR RADIOLOGIST EVAL & MGMT  01/03/2022   LAPAROSCOPIC GASTRIC SLEEVE RESECTION N/A 04/10/2016   Procedure: LAPAROSCOPIC GASTRIC SLEEVE RESECTION WITH UPPER ENDO;  Surgeon: Gaynelle Adu, MD;  Location: [facility];  Service: General;  Laterality: N/A;   LEFT HEART CATHETERIZATION WITH CORONARY ANGIOGRAM N/A 09/28/2014   Procedure: LEFT HEART CATHETERIZATION WITH CORONARY ANGIOGRAM;  Surgeon: Corky Crafts, MD;  Location: [facility] CATH [facility];  Service: Cardiovascular;  Laterality: N/A;   LIGATION OF INTERNAL FISTULA TRACT N/A 05/24/2023   Procedure: LIGATION OF INTERSPHINCTERIC FISTULOUS TRACT;  Surgeon: Andria Meuse, MD;  Location: [facility];  Service: General;  Laterality: N/A;  90   NEPHROLITHOTOMY Right 02/01/2022   Procedure: RIGHT NEPHROLITHOTOMY PERCUTANEOUS WITH SURGEON ACCESS;  Surgeon: Crist Fat, MD;  Location: [facility];  Service: Urology;  Laterality: Right;   NEPHROLITHOTOMY Left 02/15/2022   Procedure: LEFT NEPHROLITHOTOMY PERCUTANEOUS WITH SURGEON ACCESS, REMOVAL OF RIGHT URETERAL STENT;  Surgeon: Crist Fat, MD;  Location: [facility];   Service: Urology;  Laterality: Left;  195 MINUTES   PLACEMENT OF SETON N/A 02/23/2023   Procedure: SURGICAL TREATMENT OF ANAL FISTULA-TRANSSPHINTERIL WITH PLACEMENT OF SETON;  Surgeon: Andria Meuse, MD;  Location: [facility];  Service: General;  Laterality: N/A;   POLYPECTOMY  01/13/2022   Procedure: POLYPECTOMY;  Surgeon: Dolores Frame, MD;  [facility];  Service: Gastroenterology;;   RADIOLOGY WITH ANESTHESIA N/A 10/14/2014   Procedure: Mingo Amber;  Surgeon: Julieanne Cotton, MD;  Location: [facility];  Service: Radiology;  Laterality: N/A;   RECTAL EXAM UNDER ANESTHESIA N/A 05/24/2023   Procedure: ANORECTAL EXAM UNDER ANESTHESIA;  Surgeon: Andria Meuse, MD;  Location:  [facility];  Service: General;  Laterality: N/A;   TUBAL LIGATION     FAMILY HISTORY Family History  Problem Relation Age of Onset   Cataracts Mother    Kidney disease Mother        dialysis for 26 years   Heart attack Mother        36's   Diabetes Father    Cataracts Father    Heart failure Father    Heart disease Father    Heart attack Father        37's   Stroke Paternal Grandmother    SOCIAL HISTORY Social History   Tobacco Use   Smoking status: Every Day    Current packs/day: 0.25    Average packs/day: 0.2 packs/day for 47.9 years (12.0 ttl pk-yrs)    Types: Cigarettes    Start date: 07/25/1975    Passive exposure: Current   Smokeless tobacco: Never  Vaping Use   Vaping status: Never Used  Substance Use Topics   Alcohol use: Never   Drug use: No       OPHTHALMIC EXAM:  Not recorded    IMAGING AND PROCEDURES  Imaging and Procedures for 06/25/2023         ASSESSMENT/PLAN:   ICD-10-CM   1. 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 Subhyaloid hemorrhage of both eyes  H35.63     3. Long term (current) use of oral hypoglycemic drugs   Z79.84     4. Long-term (current) use of injectable non-insulin antidiabetic drugs  Z79.85     5. Essential hypertension  I10  6. Hypertensive retinopathy of both eyes  H35.033     7. Combined forms of age-related cataract of both eyes  H25.813        1-4. Proliferative diabetic retinopathy w/o DME, OU - s/p IVA OD #1 (05.23.24), #2 (06.24.24), #3 (06.24.24), #4 (08.20.24), #5 (09.17.24), #6 (10.15.24)  - s/p IVA OS #1 (06.24.24), #2 (06.24.24), #3 (08.20.24), #4 (09.17.24), #5 (10.15.24) - s/p PRP OS (06.03.24) -- incomplete / aborted due to history of photogenic seizures  - OD with subhyaloid heme -- improved; OS with persistent subhyaloid heme today - Repeat FA today (10.15.24) shows NG:EXBMW NVE and vascular non-perfusion IN midzone -- improved, no leakage, OS: punctate NVE greatest inferior midzone -- resolved, boat shaped heme inferior to disc- resolved - Repeat FA (05.23.24) shows OD: Boat shaped blockage inferior midzone from subhyaloid heme, focal NVE and vascular non-perfusion IN midzone; OS: Scattered punctate NVE greatest inferior midzone -- pt needs PRP OU - repeat FA (07.23.24): OD: low fluorescein signal, focal NVE and vascular non-perfusion IN midzone -- improved; OS: Low fluorescein signal, punctate NVE greatest inferior midzone -- improved, new boat shaped heme inferior to disc - repeat FA (10.15.24) OD: focal NVE and vascular non-perfusion IN midzone -- improved w/ regression of NV, no leakage; OS: punctate NVE greatest inferior midzone -- resolved, boat shaped heme inferior to disc- resolved - OCT shows OD: Partial PVD with mild interval improvement in vitreous opacities OS: No DME, Mild diffuse atrophy, stable improvement in pre-retinal hyper reflective material along inferior arcades, interval improvement in vitreous / subhyaloid opacities at 4 wks - recommend IVA OU (OD #7 and OS #6) today, 12.02.24 for PDR and subhyaloid hemes w/ ext f/u to 6 wks - pt wishes to  proceed  - RBA of procedure discussed, questions answered - informed consent obtained and signed 05.23.24 (OU) - see procedure note - f/u 4 weeks -- DFE/OCT, possible injxns  5,6. Hypertensive retinopathy OU - discussed importance of tight BP control - monitor  7. Mixed Cataract OU - The symptoms of cataract, surgical options, and treatments and risks were discussed with patient. - discussed diagnosis and progression - monitor  8. Hx of photogenic seizures  Ophthalmic Meds Ordered this visit:  No orders of the defined types were placed in this encounter.    No follow-ups on file.  There are no Patient Instructions on file for this visit.  Explained the diagnoses, plan, and follow up with the patient and they expressed understanding.  Patient expressed understanding of the importance of proper follow up care.   This document serves as a record of services personally performed by Karie Chimera, MD, PhD. It was created on their behalf by Glee Arvin. Manson Passey, OA an ophthalmic technician. The creation of this record is the provider's dictation and/or activities during the visit.    Electronically signed by: Glee Arvin. Manson Passey, OA 06/18/23 12:02 PM   Karie Chimera, M.D., Ph.D. Diseases & Surgery of the Retina and Vitreous [facility]    Abbreviations: M myopia (nearsighted); A astigmatism; H hyperopia (farsighted); P presbyopia; Mrx spectacle prescription;  CTL contact lenses; OD right eye; OS left eye; OU both eyes  XT exotropia; ET esotropia; PEK punctate epithelial keratitis; PEE punctate epithelial erosions; DES dry eye syndrome; MGD meibomian gland dysfunction; ATs artificial tears; PFAT's preservative free artificial tears; NSC nuclear sclerotic cataract; PSC posterior subcapsular cataract; ERM epi-retinal membrane; PVD posterior vitreous detachment; RD retinal detachment; DM diabetes mellitus; DR diabetic retinopathy;  NPDR non-proliferative diabetic  retinopathy; PDR proliferative diabetic retinopathy; CSME clinically significant macular edema; DME diabetic macular edema; dbh dot blot hemorrhages; CWS cotton wool spot; POAG primary open angle glaucoma; C/D cup-to-disc ratio; HVF humphrey visual field; GVF goldmann visual field; OCT optical coherence tomography; IOP intraocular pressure; BRVO Branch retinal vein occlusion; CRVO central retinal vein occlusion; CRAO central retinal artery occlusion; BRAO branch retinal artery occlusion; RT retinal tear; SB scleral buckle; PPV pars plana vitrectomy; VH Vitreous hemorrhage; PRP panretinal laser photocoagulation; IVK intravitreal kenalog; VMT vitreomacular traction; MH Macular hole;  NVD neovascularization of the disc; NVE neovascularization elsewhere; AREDS age related eye disease study; ARMD age related macular degeneration; POAG primary open angle glaucoma; EBMD epithelial/anterior basement membrane dystrophy; ACIOL anterior chamber intraocular lens; IOL intraocular lens; PCIOL posterior chamber intraocular lens; Phaco/IOL phacoemulsification with intraocular lens placement; PRK photorefractive keratectomy; LASIK laser assisted in situ keratomileusis; HTN hypertension; DM diabetes mellitus; COPD chronic obstructive pulmonary disease

## 2023-06-19 ENCOUNTER — Encounter (INDEPENDENT_AMBULATORY_CARE_PROVIDER_SITE_OTHER): Payer: BC Managed Care – PPO | Admitting: Ophthalmology

## 2023-06-19 DIAGNOSIS — Z7984 Long term (current) use of oral hypoglycemic drugs: Secondary | ICD-10-CM

## 2023-06-19 DIAGNOSIS — H35033 Hypertensive retinopathy, bilateral: Secondary | ICD-10-CM

## 2023-06-19 DIAGNOSIS — H25813 Combined forms of age-related cataract, bilateral: Secondary | ICD-10-CM

## 2023-06-19 DIAGNOSIS — I10 Essential (primary) hypertension: Secondary | ICD-10-CM

## 2023-06-19 DIAGNOSIS — H3563 Retinal hemorrhage, bilateral: Secondary | ICD-10-CM

## 2023-06-19 DIAGNOSIS — E113593 Type 2 diabetes mellitus with proliferative diabetic retinopathy without macular edema, bilateral: Secondary | ICD-10-CM

## 2023-06-19 DIAGNOSIS — Z7985 Long-term (current) use of injectable non-insulin antidiabetic drugs: Secondary | ICD-10-CM

## 2023-06-20 NOTE — Progress Notes (Addendum)
[facility] - Clinic Note  06/26/2023   CHIEF COMPLAINT Patient presents for Retina Follow Up  HISTORY OF PRESENT ILLNESS: Jill [name] is a 57 y.o. female who presents to the clinic today for:  HPI     Retina Follow Up   Patient presents with  Diabetic Retinopathy.  In both eyes.  This started 6 weeks ago.  Duration of 6 weeks.  Since onset it is stable.  I, the attending physician,  performed the HPI with the patient and updated documentation appropriately.        Comments   6 week retina follow up PDR OU and IVA OU pt is reporting no vision changes noticed she denies flashes or floaters her last reading 108 on Monday       Last edited by Rennis Chris, MD on 06/26/2023 12:58 PM.     Referring physician: Pecolia Ades, MD [address] [place],  [place] [address]  HISTORICAL INFORMATION:  Selected notes from the MEDICAL RECORD NUMBER Referred by Dr. Ilsa Iha for diabetic retinopathy eval -- new subhyaloid heme OD LEE:  Ocular Hx- PMH-   CURRENT MEDICATIONS: No current outpatient medications on file. (Ophthalmic Drugs)   No current facility-administered medications for this visit. (Ophthalmic Drugs)   Current Outpatient Medications (Other)  Medication Sig   acetaminophen (TYLENOL) 500 MG tablet Take 500-1,000 mg by mouth every 8 (eight) hours as needed for moderate pain or headache.    aspirin EC 81 MG tablet Take 81 mg by mouth every morning.   bismuth subsalicylate (PEPTO BISMOL) 262 MG chewable tablet Chew 524 mg by mouth 3 (three) times daily as needed (stomach pain.).   Calcium-Phosphorus-Vitamin D (CITRACAL +D3 PO) Take 2 tablets by mouth in the morning and at bedtime.   clopidogrel (PLAVIX) 75 MG tablet Take 75 mg by mouth in the morning.   Cyanocobalamin (VITAMIN B-12) 5000 MCG SUBL Place 5,000 mcg under the tongue 3 (three) times a week.   docusate sodium (COLACE) 100 MG capsule Take 100 mg by mouth in the morning.   ezetimibe (ZETIA)  10 MG tablet Take 1 tablet (10 mg total) by mouth daily.   felodipine (PLENDIL) 5 MG 24 hr tablet Take 5 mg by mouth in the morning.   furosemide (LASIX) 20 MG tablet Take 1 tablet (20 mg total) by mouth every other day. (Patient taking differently: Take 20 mg by mouth every other day. Taking PRN for swelling)   gabapentin (NEURONTIN) 300 MG capsule Take 300 mg by mouth 2 (two) times daily.   lamoTRIgine (LAMICTAL) 100 MG tablet Take 100 mg by mouth every morning.    levETIRAcetam (KEPPRA) 500 MG tablet Take 250 mg by mouth 2 (two) times daily.   linaclotide (LINZESS) 145 MCG CAPS capsule Take 145 mcg by mouth daily before breakfast.   LORazepam (ATIVAN) 0.5 MG tablet Take 0.5 mg by mouth daily as needed for anxiety.   metFORMIN (GLUCOPHAGE) 500 MG tablet Take 500 mg by mouth 2 (two) times daily.   metoprolol succinate (TOPROL-XL) 25 MG 24 hr tablet Take 1 tablet by mouth daily   Multiple Vitamins-Minerals (BARIATRIC MULTIVITAMINS/IRON PO) Take 1 tablet by mouth every evening.   olmesartan (BENICAR) 40 MG tablet Take 40 mg by mouth every morning.    OZEMPIC, 1 MG/DOSE, 2 MG/1.5ML SOPN Inject 1 mg into the skin every 30 (thirty) days.   pantoprazole (PROTONIX) 40 MG tablet Take 1 tablet (40 mg total) by mouth 2 (two) times daily.  pioglitazone (ACTOS) 30 MG tablet Take 30 mg by mouth every morning.   potassium chloride (KLOR-CON M) 10 MEQ tablet TAKE 2 TABLETS BY MOUTH DAILY.   rosuvastatin (CRESTOR) 40 MG tablet Take 1 tablet (40 mg total) by mouth at bedtime.   No current facility-administered medications for this visit. (Other)   REVIEW OF SYSTEMS: ROS   Positive for: Gastrointestinal, Neurological, Endocrine, Cardiovascular, Eyes Negative for: Constitutional, Skin, Genitourinary, Musculoskeletal, HENT, Respiratory, Psychiatric, Allergic/Imm, Heme/Lymph Last edited by Etheleen Mayhew, COT on 06/26/2023  9:47 AM.     ALLERGIES Allergies  Allergen Reactions   Dilaudid  [Hydromorphone Hcl] Nausea And Vomiting   Crab Extract Swelling    Crab Cakes   Crab [Shellfish Allergy] Swelling   Hydrocodone     "Don't like the way it feels"   Other Other (See Comments)    "pt is a difficult IV stick, prefers IV team paged for IV starts"   PAST MEDICAL HISTORY Past Medical History:  Diagnosis Date   Anemia    Anxiety    Back pain    complains when laying on hard flat surface   Chronic kidney disease    appt. /w urology 11/10/2014- for a cyst seen on [place]. evaluated was told it was nothing.   Diabetes mellitus    Diabetes II, oral and GLP-1.   DVT (deep venous thrombosis) (HCC)    right leg '09 or '10   Fibroids    GERD (gastroesophageal reflux disease)    Heart murmur    History of kidney stones    History of stress test    referred for cardiac cath- done here at [facility]- 09/2014   Hypercholesterolemia    Hypertension    Neuromuscular disorder (HCC)    neuropathy   PONV (postoperative nausea and vomiting)    Seizures (HCC)    02/01/23- last seizure approximately 2015;   Sleep apnea    using cpap as of 05/23/23   Stroke [facility])    '09-had blockage in brain, too deep to operate"occ left lower extremity fatique, occ. 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   Past Surgical History:  Procedure Laterality Date   APPENDECTOMY     BIOPSY  11/18/2021   Procedure: BIOPSY;  Surgeon: Dolores Frame, MD;  [facility];  Service: Gastroenterology;;  random colon;   BIOPSY  12/27/2021   Procedure: BIOPSY;  Surgeon: Dolores Frame, MD;  [facility]: [facility];  Service: Gastroenterology;;   CARDIAC CATHETERIZATION     CESAREAN SECTION  1986 & 1988   x2   CHOLECYSTECTOMY     open"gallstones"   COLONOSCOPY WITH PROPOFOL N/A 11/18/2021   Procedure: COLONOSCOPY WITH PROPOFOL;  Surgeon: Dolores Frame, MD;  [facility]: [facility];  Service:  Gastroenterology;  Laterality: N/A;  1215   DILATATION & CURETTAGE/HYSTEROSCOPY WITH MYOSURE N/A 05/10/2017   Procedure: DILATATION & CURETTAGE/HYSTEROSCOPY WITH MYOSURE;  Surgeon: Maxie Better, MD;  Location: [facility];  Service: Gynecology;  Laterality: N/A;   ESOPHAGOGASTRODUODENOSCOPY (EGD) WITH PROPOFOL N/A 12/27/2021   Procedure: ESOPHAGOGASTRODUODENOSCOPY (EGD) WITH PROPOFOL;  Surgeon: Dolores Frame, MD;  [facility];  Service: Gastroenterology;  Laterality: N/A;  100   ESOPHAGOGASTRODUODENOSCOPY (EGD) WITH PROPOFOL N/A 01/13/2022   Procedure: ESOPHAGOGASTRODUODENOSCOPY (EGD) WITH PROPOFOL;  Surgeon: Dolores Frame, MD;  [facility];  Service: Gastroenterology;  Laterality: N/A;  235, per  Soledad Gerlach pt knows to arrive at 6:15   HEMOSTASIS CLIP PLACEMENT  01/13/2022   Procedure: HEMOSTASIS CLIP PLACEMENT;  Surgeon: Dolores Frame, MD;  [facility];  Service: Gastroenterology;;   INCISION AND DRAINAGE ABSCESS  02/23/2023   Procedure: INCISION AND DRAINAGE OF PERI RECTAL ABSCESS;  Surgeon: Andria Meuse, MD;  Location: [facility];  Service: General;;   INTERVENTIONAL RADIOLOGY PROCEDURE     CT angiography /neck -Dr. Corliss Skains.   IR ANGIO INTRA EXTRACRAN SEL COM CAROTID INNOMINATE BILAT MOD SED  02/22/2018   IR ANGIO INTRA EXTRACRAN SEL COM CAROTID INNOMINATE BILAT MOD SED  03/08/2022   IR ANGIO VERTEBRAL SEL VERTEBRAL BILAT MOD SED  02/22/2018   IR ANGIO VERTEBRAL SEL VERTEBRAL UNI R MOD SED  03/08/2022   IR GENERIC HISTORICAL  08/03/2016   IR RADIOLOGIST EVAL & MGMT 08/03/2016 [facility]   IR RADIOLOGIST EVAL & MGMT  01/03/2022   LAPAROSCOPIC GASTRIC SLEEVE RESECTION N/A 04/10/2016   Procedure: LAPAROSCOPIC GASTRIC SLEEVE RESECTION WITH UPPER ENDO;  Surgeon: Gaynelle Adu, MD;  Location: [facility];  Service: General;  Laterality: N/A;   LEFT HEART CATHETERIZATION WITH CORONARY ANGIOGRAM N/A 09/28/2014   Procedure:  LEFT HEART CATHETERIZATION WITH CORONARY ANGIOGRAM;  Surgeon: Corky Crafts, MD;  Location: [facility] C[facility];  Service: Cardiovascular;  Laterality: N/A;   LIGATION OF INTERNAL FISTULA TRACT N/A 05/24/2023   Procedure: LIGATION OF INTERSPHINCTERIC FISTULOUS TRACT;  Surgeon: Andria Meuse, MD;  Location: [facility];  Service: General;  Laterality: N/A;  90   NEPHROLITHOTOMY Right 02/01/2022   Procedure: RIGHT NEPHROLITHOTOMY PERCUTANEOUS WITH SURGEON ACCESS;  Surgeon: Crist Fat, MD;  Location: [facility];  Service: Urology;  Laterality: Right;   NEPHROLITHOTOMY Left 02/15/2022   Procedure: LEFT NEPHROLITHOTOMY PERCUTANEOUS WITH SURGEON ACCESS, REMOVAL OF RIGHT URETERAL STENT;  Surgeon: Crist Fat, MD;  Location: [facility];  Service: Urology;  Laterality: Left;  195 MINUTES   PLACEMENT OF SETON N/A 02/23/2023   Procedure: SURGICAL TREATMENT OF ANAL FISTULA-TRANSSPHINTERIL WITH PLACEMENT OF SETON;  Surgeon: Andria Meuse, MD;  Location: [facility];  Service: General;  Laterality: N/A;   POLYPECTOMY  01/13/2022   Procedure: POLYPECTOMY;  Surgeon: Dolores Frame, MD;  [facility];  Service: Gastroenterology;;   RADIOLOGY WITH ANESTHESIA N/A 10/14/2014   Procedure: Mingo Amber;  Surgeon: Julieanne Cotton, MD;  Location: [facility];  Service: Radiology;  Laterality: N/A;   RECTAL EXAM UNDER ANESTHESIA N/A 05/24/2023   Procedure: ANORECTAL EXAM UNDER ANESTHESIA;  Surgeon: Andria Meuse, MD;  Location: [facility];  Service: General;  Laterality: N/A;   TUBAL LIGATION     FAMILY HISTORY Family History  Problem Relation Age of Onset   Cataracts Mother    Kidney disease Mother        dialysis for 26 years   Heart attack Mother        21's   Diabetes Father    Cataracts Father    Heart failure Father    Heart disease Father    Heart attack Father        65's   Stroke Paternal Grandmother    SOCIAL  HISTORY Social History   Tobacco Use   Smoking status: Every Day    Current packs/day: 0.25    Average packs/day: 0.2 packs/day for 47.9 years (12.0 ttl pk-yrs)    Types: Cigarettes    Start date: 07/25/1975  Passive exposure: Current   Smokeless tobacco: Never  Vaping Use   Vaping status: Never Used  Substance Use Topics   Alcohol use: Never   Drug use: No       OPHTHALMIC EXAM:  Base Eye Exam     Visual Acuity (Snellen - Linear)       Right Left   Dist cc 20/20 -1 20/30 -1   Dist ph cc  NI    Correction: Glasses         Tonometry (Tonopen, 9:54 AM)       Right Left   Pressure 12 09  Squeezing looking around         Pupils       Pupils Dark Light Shape React APD   Right PERRL 2 1 Round Brisk None   Left PERRL 2 1 Round Brisk None         Visual Fields       Left Right    Full Full         Extraocular Movement       Right Left    Full, Ortho Full, Ortho         Neuro/Psych     Oriented x3: Yes   Mood/Affect: Normal         Dilation     Both eyes: 2.5% Phenylephrine @ 9:54 AM           Slit Lamp and Fundus Exam     Slit Lamp Exam       Right Left   Lids/Lashes Dermatochalasis - upper lid Dermatochalasis - upper lid   Conjunctiva/Sclera mild melanosis mild melanosis   Cornea tear film debris tear film debris   Anterior Chamber deep and clear deep and clear   Iris Round and dilated, No NVI Round and dilated, No NVI   Lens 2+ Nuclear sclerosis, 2-3+ Cortical cataract 2+ Nuclear sclerosis, 2-3+ Cortical cataract, 2+ Posterior subcapsular cataract   Anterior Vitreous Vitreous syneresis, stable improvement in vitreous and subhyaloid heme, trace white VH remnants settled inferiorly Vitreous syneresis         Fundus Exam       Right Left   Disc Pink and Sharp, no NVD, +fibrosis Pink and Sharp, +cupping   C/D Ratio 0.5 0.65   Macula Flat, Good foveal reflex, RPE mottling and clumping, No heme or edema Flat, Good foveal  reflex, RPE mottling and clumping, scattered MA/DBH -- improved, no frank edema   Vessels attenuated, Tortuous, early patches of NVE inferior midzone -- regressed attenuated, tortuous, early midzonal NVE   Periphery Attached, boat shaped subhyaloid heme inferior midzone increased and more diffuse Attached, scattered MA, white now boat shaped subhayloid / pre retinal heme inferior midzone -- improving, good PRP changes nasal hemisphere           Refraction     Wearing Rx       Sphere Cylinder Axis   Right -2.00 +0.50 178   Left -2.00 +0.75 014           IMAGING AND PROCEDURES  Imaging and Procedures for 06/26/2023  OCT, Retina - OU - Both Eyes       Right Eye Quality was good. Central Foveal Thickness: 206. Progression has been stable. Findings include normal foveal contour, no IRF, no SRF, intraretinal hyper-reflective material (Partial PVD with mild stable improvement in vitreous opacities).   Left Eye Quality was good. Central Foveal Thickness: 216. Progression has improved. Findings include normal foveal  contour, no IRF, no SRF, vitreomacular adhesion (No DME, Mild diffuse atrophy, stable improvement in pre-retinal hyper reflective material along inferior arcades, interval improvement in vitreous opacities).   Notes *Images captured and stored on drive  Diagnosis / Impression:  No DME OU OD: Partial PVD with mild stable improvement in vitreous opacities OS: No DME, Mild diffuse atrophy, stable improvement in pre-retinal hyper reflective material along inferior arcades, interval improvement in vitreous opacities  Clinical management:  See below  Abbreviations: NFP - Normal foveal profile. CME - cystoid macular edema. PED - pigment epithelial detachment. IRF - intraretinal fluid. SRF - subretinal fluid. EZ - ellipsoid zone. ERM - epiretinal membrane. ORA - outer retinal atrophy. ORT - outer retinal tubulation. SRHM - subretinal hyper-reflective material. IRHM -  intraretinal hyper-reflective material      Intravitreal Injection, Pharmacologic Agent - OS - Left Eye       Time Out 06/26/2023. 10:16 AM. Confirmed correct patient, procedure, site, and patient consented.   Anesthesia Topical anesthesia was used. Anesthetic medications included Lidocaine 2%, Proparacaine 0.5%.   Procedure Preparation included 5% betadine to ocular surface, eyelid speculum. A supplied (32g) needle was used.   Injection: 1.25 mg Bevacizumab 1.25mg /0.55ml   Route: Intravitreal, Site: Left Eye   NDC: [id number], Lot: [id number], Expiration date: 07/29/2023   Post-op Post injection exam found visual acuity of at least counting fingers. The patient tolerated the procedure well. There were no complications. The patient received written and verbal post procedure care education.           ASSESSMENT/PLAN:   ICD-10-CM   1. 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 Subhyaloid hemorrhage of both eyes  H35.63     3. Long term (current) use of oral hypoglycemic drugs  Z79.84     4. Long-term (current) use of injectable non-insulin antidiabetic drugs  Z79.85     5. Essential hypertension  I10     6. Hypertensive retinopathy of both eyes  H35.033     7. Combined forms of age-related cataract of both eyes  H25.813      1-4. Proliferative diabetic retinopathy w/o DME, OU - s/p IVA OD #1 (05.23.24), #2 (06.24.24), #3 (06.24.24), #4 (08.20.24), #5 (09.17.24), #6 (10.15.24),  - s/p IVA OS #1 (06.24.24), #2 (06.24.24), #3 (08.20.24), #4 (09.17.24), #5 (10.15.24) - s/p PRP OS (06.03.24) -- incomplete / aborted due to history of photogenic seizures  - mild VH improving OU - FA (05.23.24) shows OD: Boat shaped blockage inferior midzone from subhyaloid  heme, focal NVE and vascular non-perfusion IN midzone; OS: Scattered punctate NVE greatest inferior midzone -- pt needs PRP OU - repeat FA (07.23.24): OD: low fluorescein signal, focal NVE and vascular non-perfusion IN midzone -- improved; OS: Low fluorescein signal, punctate NVE greatest inferior midzone -- improved, new boat shaped heme inferior to disc - repeat FA (10.15.24) OD: focal NVE and vascular non-perfusion IN midzone -- improved w/ regression of NV, no leakage; OS: punctate NVE greatest inferior midzone -- resolved, boat shaped heme inferior to disc- resolved - OCT shows OD: Partial PVD with mild stable improvement in vitreous opacities; OS: No DME, Mild diffuse atrophy, stable improvement in pre-retinal hyper reflective material along inferior arcades, interval improvement in vitreous / subhyaloid opacitiesat 6  wks - recommend IVA OS #6 today, 12.03.24 w/ f/u ext to 8 wks - will hold off tx in OD today -- pt in agreement - pt wishes to proceed with injxn OS - RBA of procedure discussed, questions answered - informed consent obtained and signed 05.23.24 (OU) - see procedure note - f/u 8 weeks -- DFE/OCT, possible injxns, ?repeat FA  5,6. Hypertensive retinopathy OU - discussed importance of tight BP control - monitor  7. Mixed Cataract OU - The symptoms of cataract, surgical options, and treatments and risks were discussed with patient. - discussed diagnosis and progression - monitor  8. Hx of photogenic seizures  Ophthalmic Meds Ordered this visit:  Meds ordered this encounter  Medications   Bevacizumab (AVASTIN) SOLN 1.25 mg     Return in about 8 weeks (around 08/21/2023) for f/u PDR OU, DFE, OCT.  There are no Patient Instructions on file for this visit.  Explained the diagnoses, plan, and follow up with the patient and they expressed understanding.  Patient expressed understanding of the importance of proper follow up care.   This document serves as a record of  services personally performed by Karie Chimera, MD, PhD. It was created on their behalf by Charlette Caffey, COT an ophthalmic technician. The creation of this record is the provider's dictation and/or activities during the visit.    Electronically signed by:  Charlette Caffey, COT  06/26/23 12:59 PM  This document serves as a record of services personally performed by Karie Chimera, MD, PhD. It was created on their behalf by Glee Arvin. Manson Passey, OA an ophthalmic technician. The creation of this record is the provider's dictation and/or activities during the visit.    Electronically signed by: Glee Arvin. Manson Passey, OA 06/26/23 12:59 PM  Karie Chimera, M.D., Ph.D. Diseases & Surgery of the Retina and Vitreous [facility]  I have reviewed the above documentation for accuracy and completeness, and I agree with the above. Karie Chimera, M.D., Ph.D. 06/26/23 1:02 PM   Abbreviations: M myopia (nearsighted); A astigmatism; H hyperopia (farsighted); P presbyopia; Mrx spectacle prescription;  CTL contact lenses; OD right eye; OS left eye; OU both eyes  XT exotropia; ET esotropia; PEK punctate epithelial keratitis; PEE punctate epithelial erosions; DES dry eye syndrome; MGD meibomian gland dysfunction; ATs artificial tears; PFAT's preservative free artificial tears; NSC nuclear sclerotic cataract; PSC posterior subcapsular cataract; ERM epi-retinal membrane; PVD posterior vitreous detachment; RD retinal detachment; DM diabetes mellitus; DR diabetic retinopathy; NPDR non-proliferative diabetic retinopathy; PDR proliferative diabetic retinopathy; CSME clinically significant macular edema; DME diabetic macular edema; dbh dot blot hemorrhages; CWS cotton wool spot; POAG primary open angle glaucoma; C/D cup-to-disc ratio; HVF humphrey visual field; GVF goldmann visual field; OCT optical coherence tomography; IOP intraocular pressure; BRVO Branch retinal vein occlusion; CRVO central  retinal vein occlusion; CRAO central retinal artery occlusion; BRAO branch retinal artery occlusion; RT retinal tear; SB scleral buckle; PPV pars plana vitrectomy; VH Vitreous hemorrhage; PRP panretinal laser photocoagulation; IVK intravitreal kenalog; VMT vitreomacular traction; MH Macular hole;  NVD neovascularization of the disc; NVE neovascularization elsewhere; AREDS age related eye disease study; ARMD age related macular degeneration; POAG primary open angle glaucoma; EBMD epithelial/anterior basement membrane dystrophy; ACIOL anterior chamber intraocular lens; IOL intraocular lens; PCIOL posterior chamber intraocular lens; Phaco/IOL phacoemulsification with intraocular lens placement; PRK photorefractive keratectomy; LASIK laser assisted in situ keratomileusis; HTN hypertension; DM diabetes mellitus; COPD chronic obstructive pulmonary disease

## 2023-06-25 ENCOUNTER — Telehealth: Payer: Self-pay | Admitting: Pulmonary Disease

## 2023-06-25 ENCOUNTER — Encounter (INDEPENDENT_AMBULATORY_CARE_PROVIDER_SITE_OTHER): Payer: BC Managed Care – PPO | Admitting: Ophthalmology

## 2023-06-25 DIAGNOSIS — H3563 Retinal hemorrhage, bilateral: Secondary | ICD-10-CM

## 2023-06-25 DIAGNOSIS — E113593 Type 2 diabetes mellitus with proliferative diabetic retinopathy without macular edema, bilateral: Secondary | ICD-10-CM

## 2023-06-25 DIAGNOSIS — Z7985 Long-term (current) use of injectable non-insulin antidiabetic drugs: Secondary | ICD-10-CM

## 2023-06-25 DIAGNOSIS — I10 Essential (primary) hypertension: Secondary | ICD-10-CM

## 2023-06-25 DIAGNOSIS — H25813 Combined forms of age-related cataract, bilateral: Secondary | ICD-10-CM

## 2023-06-25 DIAGNOSIS — H35033 Hypertensive retinopathy, bilateral: Secondary | ICD-10-CM

## 2023-06-25 DIAGNOSIS — Z7984 Long term (current) use of oral hypoglycemic drugs: Secondary | ICD-10-CM

## 2023-06-25 NOTE — Telephone Encounter (Signed)
This was for a cpap titration study had a reg sleep study in 12/2022 I believe it was ordered by another provider

## 2023-06-25 NOTE — Telephone Encounter (Signed)
Jill [name] is wondering why she is scheduled for another sleep study this week when she had one in June of this year.  Please advise  [phone]

## 2023-06-26 ENCOUNTER — Encounter (HOSPITAL_BASED_OUTPATIENT_CLINIC_OR_DEPARTMENT_OTHER): Payer: BC Managed Care – PPO | Admitting: Pulmonary Disease

## 2023-06-26 ENCOUNTER — Encounter (INDEPENDENT_AMBULATORY_CARE_PROVIDER_SITE_OTHER): Payer: Self-pay | Admitting: Ophthalmology

## 2023-06-26 ENCOUNTER — Ambulatory Visit (INDEPENDENT_AMBULATORY_CARE_PROVIDER_SITE_OTHER): Payer: BC Managed Care – PPO | Admitting: Ophthalmology

## 2023-06-26 DIAGNOSIS — H25813 Combined forms of age-related cataract, bilateral: Secondary | ICD-10-CM

## 2023-06-26 DIAGNOSIS — Z7985 Long-term (current) use of injectable non-insulin antidiabetic drugs: Secondary | ICD-10-CM | POA: Diagnosis not present

## 2023-06-26 DIAGNOSIS — Z7984 Long term (current) use of oral hypoglycemic drugs: Secondary | ICD-10-CM

## 2023-06-26 DIAGNOSIS — E113593 Type 2 diabetes mellitus with proliferative diabetic retinopathy without macular edema, bilateral: Secondary | ICD-10-CM | POA: Diagnosis not present

## 2023-06-26 DIAGNOSIS — H35033 Hypertensive retinopathy, bilateral: Secondary | ICD-10-CM

## 2023-06-26 DIAGNOSIS — H3563 Retinal hemorrhage, bilateral: Secondary | ICD-10-CM

## 2023-06-26 DIAGNOSIS — I10 Essential (primary) hypertension: Secondary | ICD-10-CM

## 2023-06-26 MED ORDER — BEVACIZUMAB CHEMO INJECTION 1.25MG/0.05ML SYRINGE FOR KALEIDOSCOPE
1.2500 mg | INTRAVITREAL | Status: AC | PRN
  Administered 2023-06-26: 1.25 mg via INTRAVITREAL

## 2023-07-02 NOTE — Telephone Encounter (Signed)
Dr Vassie Loll patient wanting to know why she is scheduled for titration study as she did sleep study in June 2024. Please advise.  Per last ov note: Author: Oretha Milch, MD Author Type: Physician Filed: 03/29/2023  1:52 PM  Note Status: Written Cosign: Cosign Not Required Encounter Date: 03/29/2023  Problem: OSA on CPAP  Editor: Oretha Milch, MD (Physician)              She is benefiting well from the machine.  CPAP download was reviewed which shows good control of events on auto settings 5 to 12 cm with average pressure of 10 and maximum pressure of 11 cm.  On some nights AHI goes up to 10/hour with suggest that home sleep test was an underestimation. We will change her to auto CPAP settings 8 to 12 cm.  She would like to trial nasal pillows in the future.  She has a mild leak is not symptomatic from this.  If this becomes an issue, chinstrap may be helpful. We discussed care of her machine.  CPAP has certainly helped improve her daytime somnolence and fatigue and she is very compliant.  CPAP supplies will be renewed for a year   Weight loss encouraged, compliance with goal of at least 4-6 hrs every night is the expectation. Advised against medications with sedative side effects Cautioned against driving when sleepy - understanding that sleepiness will vary on a day to day basis        Patient Instructions by Oretha Milch, MD at 03/29/2023 1:30 PM  Author: Oretha Milch, MD Author Type: Physician Filed: 03/29/2023  1:48 PM  Note Status: Signed Cosign: Cosign Not Required Encounter Date: 03/29/2023  Editor: Oretha Milch, MD (Physician)             X Change to auto CPAP 8-12 cm   OK to trial nasal pillows       Instructions   Return in about 1 year (around 03/28/2024) for with APP. X Change to auto CPAP 8-12 cm   OK to trial nasal pillows        After Visit Summary (Printed 03/29/2023)

## 2023-07-04 NOTE — Telephone Encounter (Signed)
LMTCB x 1 

## 2023-07-05 ENCOUNTER — Ambulatory Visit (HOSPITAL_COMMUNITY)
Admission: RE | Admit: 2023-07-05 | Discharge: 2023-07-05 | Disposition: A | Discharge status: Home / Self Care | Payer: BC Managed Care – PPO | Source: Ambulatory Visit | Attending: Family Medicine | Admitting: Family Medicine

## 2023-07-05 DIAGNOSIS — Z720 Tobacco use: Secondary | ICD-10-CM | POA: Insufficient documentation

## 2023-07-05 DIAGNOSIS — F17200 Nicotine dependence, unspecified, uncomplicated: Secondary | ICD-10-CM | POA: Diagnosis not present

## 2023-07-05 DIAGNOSIS — Z87891 Personal history of nicotine dependence: Secondary | ICD-10-CM | POA: Insufficient documentation

## 2023-07-05 NOTE — Telephone Encounter (Signed)
Patient is having events for the last 2 nights of  12.4 and 12.1 with recent pressure change to 8-12 cm She is not able to come to [place] to do titration study. Please advise.

## 2023-07-06 ENCOUNTER — Other Ambulatory Visit: Payer: Self-pay | Admitting: Family Medicine

## 2023-07-06 ENCOUNTER — Ambulatory Visit
Admission: RE | Admit: 2023-07-06 | Discharge: 2023-07-06 | Disposition: A | Discharge status: Home / Self Care | Payer: BC Managed Care – PPO | Source: Ambulatory Visit | Attending: Family Medicine | Admitting: Family Medicine

## 2023-07-06 DIAGNOSIS — M549 Dorsalgia, unspecified: Secondary | ICD-10-CM

## 2023-07-06 DIAGNOSIS — M545 Low back pain, unspecified: Secondary | ICD-10-CM | POA: Diagnosis not present

## 2023-07-06 NOTE — Telephone Encounter (Signed)
Left voicemail for patient to call back. 

## 2023-07-11 ENCOUNTER — Encounter: Payer: Self-pay | Admitting: Orthopedic Surgery

## 2023-07-11 ENCOUNTER — Ambulatory Visit: Payer: BC Managed Care – PPO | Admitting: Orthopedic Surgery

## 2023-07-11 VITALS — BP 156/96 | HR 87 | Ht 66.5 in | Wt 215.0 lb

## 2023-07-11 DIAGNOSIS — Z1211 Encounter for screening for malignant neoplasm of colon: Secondary | ICD-10-CM | POA: Insufficient documentation

## 2023-07-11 DIAGNOSIS — M545 Low back pain, unspecified: Secondary | ICD-10-CM | POA: Diagnosis not present

## 2023-07-11 MED ORDER — CYCLOBENZAPRINE HCL 10 MG PO TABS: 10.0000 mg | ORAL_TABLET | Freq: Two times a day (BID) | ORAL | 0 refills | Status: DC | PRN

## 2023-07-11 MED ORDER — PREDNISONE 10 MG (21) PO TBPK
ORAL_TABLET | ORAL | 0 refills | Status: DC
Start: 2023-07-11 — End: 2023-11-12

## 2023-07-11 NOTE — Patient Instructions (Signed)
Please provide a note for work - out of work for the next 2 weeks.

## 2023-07-11 NOTE — Progress Notes (Signed)
New Patient Visit  Assessment: Jill [name] is a 57 y.o. female with the following: 1. Acute bilateral low back pain without sciatica  Plan: Jill [name] has pain in her lower back.  She recently fell.  XR are negative for acute fracture. She has good strength.  No numbness or tingling.  Recommend prednisone for inflammation and flexeril for spasm.  Out of work for 2 weeks.   Follow-up: Return if symptoms worsen or fail to improve.  Subjective:  Chief Complaint  Patient presents with   Back Pain    Left sided back / states sacral area fell last week     History of Present Illness: Jill [name] is a 57 y.o. female who presents for evaluation of low back pain.  She states she has left sided low back pain.  She fell on her bottom 1-2 weeks ago.  XR are negative.  Medications have not been effective.  Some pain into the left leg.  No therapy.    Review of Systems: No fevers or chills No numbness or tingling No chest pain No shortness of breath No bowel or bladder dysfunction No GI distress No headaches   Medical History:  Past Medical History:  Diagnosis Date   Anemia    Anxiety    Back pain    complains when laying on hard flat surface   Chronic kidney disease    appt. /w urology 11/10/2014- for a cyst seen on [place]. evaluated was told it was nothing.   Diabetes mellitus    Diabetes II, oral and GLP-1.   DVT (deep venous thrombosis) (HCC)    right leg '09 or '10   Fibroids    GERD (gastroesophageal reflux disease)    Heart murmur    History of kidney stones    History of stress test    referred for cardiac cath- done here at [facility]- 09/2014   Hypercholesterolemia    Hypertension    Neuromuscular disorder (HCC)    neuropathy   PONV (postoperative nausea and vomiting)    Seizures (HCC)    02/01/23- last seizure approximately 2015;   Sleep apnea    using cpap as of 05/23/23   Stroke [facility])    '09-had blockage in brain, too deep to operate"occ left lower  extremity fatique, occ. 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    Past Surgical History:  Procedure Laterality Date   APPENDECTOMY     BIOPSY  11/18/2021   Procedure: BIOPSY;  Surgeon: Dolores Frame, MD;  [facility]: [facility];  Service: Gastroenterology;;  random colon;   BIOPSY  12/27/2021   Procedure: BIOPSY;  Surgeon: Dolores Frame, MD;  Location: [facility];  Service: Gastroenterology;;   CARDIAC CATHETERIZATION     CESAREAN SECTION  1986 & 1988   x2   CHOLECYSTECTOMY     open"gallstones"   COLONOSCOPY WITH PROPOFOL N/A 11/18/2021   Procedure: COLONOSCOPY WITH PROPOFOL;  Surgeon: Dolores Frame, MD;  [facility]: [facility];  Service: Gastroenterology;  Laterality: N/A;  1215   DILATATION & CURETTAGE/HYSTEROSCOPY WITH MYOSURE N/A 05/10/2017   Procedure: DILATATION & CURETTAGE/HYSTEROSCOPY WITH MYOSURE;  Surgeon: Maxie Better, MD;  Location: [facility];  Service: Gynecology;  Laterality: N/A;   ESOPHAGOGASTRODUODENOSCOPY (EGD) WITH PROPOFOL N/A 12/27/2021   Procedure: ESOPHAGOGASTRODUODENOSCOPY (EGD) WITH PROPOFOL;  Surgeon: Dolores Frame, MD;  [facility]: [facility];  Service: Gastroenterology;  Laterality: N/A;  100   ESOPHAGOGASTRODUODENOSCOPY (EGD) WITH PROPOFOL N/A 01/13/2022   Procedure: ESOPHAGOGASTRODUODENOSCOPY (EGD) WITH PROPOFOL;  Surgeon: Dolores Frame, MD;  [facility]: [facility];  Service: Gastroenterology;  Laterality: N/A;  235, per Soledad Gerlach pt knows to arrive at 6:15   HEMOSTASIS CLIP PLACEMENT  01/13/2022   Procedure: HEMOSTASIS CLIP PLACEMENT;  Surgeon: Dolores Frame, MD;  [facility]: [facility];  Service: Gastroenterology;;   INCISION AND DRAINAGE ABSCESS  02/23/2023   Procedure: INCISION AND DRAINAGE OF PERI RECTAL ABSCESS;  Surgeon: Andria Meuse, MD;  Location: [facility];  Service:  General;;   INTERVENTIONAL RADIOLOGY PROCEDURE     CT angiography /neck -Dr. Corliss Skains.   IR ANGIO INTRA EXTRACRAN SEL COM CAROTID INNOMINATE BILAT MOD SED  02/22/2018   IR ANGIO INTRA EXTRACRAN SEL COM CAROTID INNOMINATE BILAT MOD SED  03/08/2022   IR ANGIO VERTEBRAL SEL VERTEBRAL BILAT MOD SED  02/22/2018   IR ANGIO VERTEBRAL SEL VERTEBRAL UNI R MOD SED  03/08/2022   IR GENERIC HISTORICAL  08/03/2016   IR RADIOLOGIST EVAL & MGMT 08/03/2016 [facility]   IR RADIOLOGIST EVAL & MGMT  01/03/2022   LAPAROSCOPIC GASTRIC SLEEVE RESECTION N/A 04/10/2016   Procedure: LAPAROSCOPIC GASTRIC SLEEVE RESECTION WITH UPPER ENDO;  Surgeon: Gaynelle Adu, MD;  Location: [facility];  Service: General;  Laterality: N/A;   LEFT HEART CATHETERIZATION WITH CORONARY ANGIOGRAM N/A 09/28/2014   Procedure: LEFT HEART CATHETERIZATION WITH CORONARY ANGIOGRAM;  Surgeon: Corky Crafts, MD;  Location: [facility] C[facility];  Service: Cardiovascular;  Laterality: N/A;   LIGATION OF INTERNAL FISTULA TRACT N/A 05/24/2023   Procedure: LIGATION OF INTERSPHINCTERIC FISTULOUS TRACT;  Surgeon: Andria Meuse, MD;  Location: [facility];  Service: General;  Laterality: N/A;  90   NEPHROLITHOTOMY Right 02/01/2022   Procedure: RIGHT NEPHROLITHOTOMY PERCUTANEOUS WITH SURGEON ACCESS;  Surgeon: Crist Fat, MD;  Location: [facility];  Service: Urology;  Laterality: Right;   NEPHROLITHOTOMY Left 02/15/2022   Procedure: LEFT NEPHROLITHOTOMY PERCUTANEOUS WITH SURGEON ACCESS, REMOVAL OF RIGHT URETERAL STENT;  Surgeon: Crist Fat, MD;  Location: [facility];  Service: Urology;  Laterality: Left;  195 MINUTES   PLACEMENT OF SETON N/A 02/23/2023   Procedure: SURGICAL TREATMENT OF ANAL FISTULA-TRANSSPHINTERIL WITH PLACEMENT OF SETON;  Surgeon: Andria Meuse, MD;  Location: [facility];  Service: General;  Laterality: N/A;   POLYPECTOMY  01/13/2022   Procedure: POLYPECTOMY;  Surgeon: Dolores Frame, MD;   [facility];  Service: Gastroenterology;;   RADIOLOGY WITH ANESTHESIA N/A 10/14/2014   Procedure: Mingo Amber;  Surgeon: Julieanne Cotton, MD;  Location: [facility];  Service: Radiology;  Laterality: N/A;   RECTAL EXAM UNDER ANESTHESIA N/A 05/24/2023   Procedure: ANORECTAL EXAM UNDER ANESTHESIA;  Surgeon: Andria Meuse, MD;  Location: [facility];  Service: General;  Laterality: N/A;   TUBAL LIGATION      Family History  Problem Relation Age of Onset   Cataracts Mother    Kidney disease Mother        dialysis for 26 years   Heart attack Mother        76's   Diabetes Father    Cataracts Father    Heart failure Father    Heart disease Father    Heart attack Father        53's   Stroke Paternal Grandmother    Social History   Tobacco Use  Smoking status: Every Day    Current packs/day: 0.25    Average packs/day: 0.3 packs/day for 48.0 years (12.0 ttl pk-yrs)    Types: Cigarettes    Start date: 07/25/1975    Passive exposure: Current   Smokeless tobacco: Never  Vaping Use   Vaping status: Never Used  Substance Use Topics   Alcohol use: Never   Drug use: No    Allergies  Allergen Reactions   Dilaudid [Hydromorphone Hcl] Nausea And Vomiting   Crab Extract Swelling    Crab Cakes   Crab [Shellfish Allergy] Swelling   Hydrocodone     "Don't like the way it feels"   Other Other (See Comments)    "pt is a difficult IV stick, prefers IV team paged for IV starts"    Current Meds  Medication Sig   acetaminophen (TYLENOL) 500 MG tablet Take 500-1,000 mg by mouth every 8 (eight) hours as needed for moderate pain or headache.    aspirin EC 81 MG tablet Take 81 mg by mouth every morning.   bismuth subsalicylate (PEPTO BISMOL) 262 MG chewable tablet Chew 524 mg by mouth 3 (three) times daily as needed (stomach pain.).   Calcium-Phosphorus-Vitamin D (CITRACAL +D3 PO) Take 2 tablets by mouth in the morning and at bedtime.   clopidogrel (PLAVIX) 75 MG  tablet Take 75 mg by mouth in the morning.   Cyanocobalamin (VITAMIN B-12) 5000 MCG SUBL Place 5,000 mcg under the tongue 3 (three) times a week.   cyclobenzaprine (FLEXERIL) 10 MG tablet Take 1 tablet (10 mg total) by mouth 2 (two) times daily as needed.   docusate sodium (COLACE) 100 MG capsule Take 100 mg by mouth in the morning.   ezetimibe (ZETIA) 10 MG tablet Take 1 tablet (10 mg total) by mouth daily.   felodipine (PLENDIL) 5 MG 24 hr tablet Take 5 mg by mouth in the morning.   furosemide (LASIX) 20 MG tablet Take 1 tablet (20 mg total) by mouth every other day. (Patient taking differently: Take 20 mg by mouth every other day. Taking PRN for swelling)   gabapentin (NEURONTIN) 300 MG capsule Take 300 mg by mouth 2 (two) times daily.   lamoTRIgine (LAMICTAL) 100 MG tablet Take 100 mg by mouth every morning.    levETIRAcetam (KEPPRA) 500 MG tablet Take 250 mg by mouth 2 (two) times daily.   linaclotide (LINZESS) 145 MCG CAPS capsule Take 145 mcg by mouth daily before breakfast.   LORazepam (ATIVAN) 0.5 MG tablet Take 0.5 mg by mouth daily as needed for anxiety.   metFORMIN (GLUCOPHAGE) 500 MG tablet Take 500 mg by mouth 2 (two) times daily.   metoprolol succinate (TOPROL-XL) 25 MG 24 hr tablet Take 1 tablet by mouth daily   Multiple Vitamins-Minerals (BARIATRIC MULTIVITAMINS/IRON PO) Take 1 tablet by mouth every evening.   olmesartan (BENICAR) 40 MG tablet Take 40 mg by mouth every morning.    OZEMPIC, 1 MG/DOSE, 2 MG/1.5ML SOPN Inject 1 mg into the skin every 30 (thirty) days.   pantoprazole (PROTONIX) 40 MG tablet Take 1 tablet (40 mg total) by mouth 2 (two) times daily.   pioglitazone (ACTOS) 30 MG tablet Take 30 mg by mouth every morning.   potassium chloride (KLOR-CON M) 10 MEQ tablet TAKE 2 TABLETS BY MOUTH DAILY.   predniSONE (STERAPRED UNI-PAK 21 TAB) 10 MG (21) TBPK tablet 10 mg DS 12 as directed   rosuvastatin (CRESTOR) 40 MG tablet Take 1 tablet (40 mg total) by mouth at  bedtime.     Objective: BP (!) 156/96   Pulse 87   Ht 5' 6.5" (1.689 m)   Wt 215 lb (97.5 kg)   LMP 12/10/2014 Comment: pt states that there is no chance or pregnancy  BMI 34.18 kg/m   Physical Exam:  General: Alert and oriented. and No acute distress. Gait: Left sided antalgic gait.  Low back without deformity.  Mild tenderness. Negative straight leg raise.  Toes are WWP.  Sensation intact in BLE.  Good strength in BLE .  IMAGING: I personally reviewed images previously obtained in clinic  XR Pelvis  IMPRESSION: 1. No acute osseous abnormality identified in the pelvis. 2. Advanced atherosclerosis. Right iliac and femoral artery vascular stents. Bulky calcified uterine fibroids.   Lumbar Spine XR  IMPRESSION: 1. No acute osseous abnormality identified in the lumbar spine. Mild chronic lumbar scoliosis and multilevel spondylolisthesis. 2. Severe Aortic Atherosclerosis (ICD10-I70.0). Right Common iliac vascular stent. Bulky nephrolithiasis. Extensive calcified uterine fibroids.  Thoracic spine XR  IMPRESSION: 1. No acute osseous abnormality identified in the thoracic spine. 2. Chronic thoracic scoliosis. Degenerative disc and endplate changes at T11-T12.     New Medications:  Meds ordered this encounter  Medications   predniSONE (STERAPRED UNI-PAK 21 TAB) 10 MG (21) TBPK tablet    Sig: 10 mg DS 12 as directed    Dispense:  48 tablet    Refill:  0   cyclobenzaprine (FLEXERIL) 10 MG tablet    Sig: Take 1 tablet (10 mg total) by mouth 2 (two) times daily as needed.    Dispense:  20 tablet    Refill:  0      Oliver Barre, MD  07/11/2023 2:53 PM

## 2023-07-13 DIAGNOSIS — G4700 Insomnia, unspecified: Secondary | ICD-10-CM | POA: Diagnosis not present

## 2023-07-13 DIAGNOSIS — M545 Low back pain, unspecified: Secondary | ICD-10-CM | POA: Diagnosis not present

## 2023-07-13 DIAGNOSIS — F411 Generalized anxiety disorder: Secondary | ICD-10-CM | POA: Diagnosis not present

## 2023-07-13 DIAGNOSIS — I10 Essential (primary) hypertension: Secondary | ICD-10-CM | POA: Diagnosis not present

## 2023-07-13 NOTE — Telephone Encounter (Signed)
Called pt to see if she's still wanting to resch sleep study   Left vm

## 2023-07-23 ENCOUNTER — Telehealth: Payer: Self-pay | Admitting: Pharmacy Technician

## 2023-07-23 ENCOUNTER — Other Ambulatory Visit (HOSPITAL_COMMUNITY): Payer: Self-pay

## 2023-07-23 NOTE — Telephone Encounter (Signed)
Pharmacy Patient Advocate Encounter   Received notification from CoverMyMeds that prior authorization for furosemide is required/requested.   Insurance verification completed.   The patient is insured through [employer]  .   Per test claim: The current 07/23/23 day co-pay is, $0.00.  No PA needed at this time. This test claim was processed through [facility]- copay amounts may vary at other pharmacies due to pharmacy/plan contracts, or as the patient moves through the different stages of their insurance plan.     Insurance covers #[id number] for 90 days- called [facility] to ensure paid claim

## 2023-07-26 NOTE — Telephone Encounter (Signed)
 LMTCB to schedule routine f/u with APP per RA.

## 2023-08-09 NOTE — Progress Notes (Signed)
[facility] - Clinic Note  08/21/2023   CHIEF COMPLAINT Patient presents for Retina Follow Up  HISTORY OF PRESENT ILLNESS: Jill [name] is a 58 y.o. female who presents to the clinic today for:  HPI     Retina Follow Up   Patient presents with  Diabetic Retinopathy.  In both eyes.  This started 8 weeks ago.  Duration of 8 weeks.  Since onset it is stable.  I, the attending physician,  performed the HPI with the patient and updated documentation appropriately.        Comments   8 week retina follow up PDR OU and IVA OS pt is reporting no vision changes noticed she denies any flashes or floaters her last reading 121      Last edited by Rennis Chris, MD on 08/23/2023 12:33 PM.    Pt states she is not seeing any floaters  Referring physician: Pecolia Ades, MD [address] [place],  [place] [address]  HISTORICAL INFORMATION:  Selected notes from the MEDICAL RECORD NUMBER Referred by Dr. Ilsa Iha for diabetic retinopathy eval -- new subhyaloid heme OD LEE:  Ocular Hx- PMH-   CURRENT MEDICATIONS: No current outpatient medications on file. (Ophthalmic Drugs)   No current facility-administered medications for this visit. (Ophthalmic Drugs)   Current Outpatient Medications (Other)  Medication Sig   acetaminophen (TYLENOL) 500 MG tablet Take 500-1,000 mg by mouth every 8 (eight) hours as needed for moderate pain or headache.    aspirin EC 81 MG tablet Take 81 mg by mouth every morning.   bismuth subsalicylate (PEPTO BISMOL) 262 MG chewable tablet Chew 524 mg by mouth 3 (three) times daily as needed (stomach pain.).   Calcium-Phosphorus-Vitamin D (CITRACAL +D3 PO) Take 2 tablets by mouth in the morning and at bedtime.   clopidogrel (PLAVIX) 75 MG tablet Take 75 mg by mouth in the morning.   Cyanocobalamin (VITAMIN B-12) 5000 MCG SUBL Place 5,000 mcg under the tongue 3 (three) times a week.   cyclobenzaprine (FLEXERIL) 10 MG tablet Take 1 tablet (10 mg  total) by mouth 2 (two) times daily as needed.   docusate sodium (COLACE) 100 MG capsule Take 100 mg by mouth in the morning.   ezetimibe (ZETIA) 10 MG tablet Take 1 tablet (10 mg total) by mouth daily.   felodipine (PLENDIL) 5 MG 24 hr tablet Take 5 mg by mouth in the morning.   furosemide (LASIX) 20 MG tablet Take 1 tablet (20 mg total) by mouth every other day. (Patient taking differently: Take 20 mg by mouth every other day. Taking PRN for swelling)   gabapentin (NEURONTIN) 300 MG capsule Take 300 mg by mouth 2 (two) times daily.   lamoTRIgine (LAMICTAL) 100 MG tablet Take 100 mg by mouth every morning.    levETIRAcetam (KEPPRA) 500 MG tablet Take 250 mg by mouth 2 (two) times daily.   linaclotide (LINZESS) 145 MCG CAPS capsule Take 145 mcg by mouth daily before breakfast.   LORazepam (ATIVAN) 0.5 MG tablet Take 0.5 mg by mouth daily as needed for anxiety.   metFORMIN (GLUCOPHAGE) 500 MG tablet Take 500 mg by mouth 2 (two) times daily.   metoprolol succinate (TOPROL-XL) 25 MG 24 hr tablet Take 1 tablet by mouth daily   Multiple Vitamins-Minerals (BARIATRIC MULTIVITAMINS/IRON PO) Take 1 tablet by mouth every evening.   olmesartan (BENICAR) 40 MG tablet Take 40 mg by mouth every morning.    OZEMPIC, 1 MG/DOSE, 2 MG/1.5ML SOPN Inject 1  mg into the skin every 30 (thirty) days.   pantoprazole (PROTONIX) 40 MG tablet Take 1 tablet (40 mg total) by mouth 2 (two) times daily.   pioglitazone (ACTOS) 30 MG tablet Take 30 mg by mouth every morning.   potassium chloride (KLOR-CON M) 10 MEQ tablet TAKE 2 TABLETS BY MOUTH DAILY.   predniSONE (STERAPRED UNI-PAK 21 TAB) 10 MG (21) TBPK tablet 10 mg DS 12 as directed (Patient not taking: Reported on 08/15/2023)   rosuvastatin (CRESTOR) 40 MG tablet Take 1 tablet (40 mg total) by mouth at bedtime.   No current facility-administered medications for this visit. (Other)   REVIEW OF SYSTEMS: ROS   Positive for: Gastrointestinal, Neurological, Endocrine,  Cardiovascular, Eyes Negative for: Constitutional, Skin, Genitourinary, Musculoskeletal, HENT, Respiratory, Psychiatric, Allergic/Imm, Heme/Lymph Last edited by Etheleen Mayhew, COT on 08/21/2023  9:37 AM.      ALLERGIES Allergies  Allergen Reactions   Dilaudid [Hydromorphone Hcl] Nausea And Vomiting   Crab Extract Swelling    Crab Cakes   Crab [Shellfish Allergy] Swelling   Hydrocodone     "Don't like the way it feels"   Other Other (See Comments)    "pt is a difficult IV stick, prefers IV team paged for IV starts"   PAST MEDICAL HISTORY Past Medical History:  Diagnosis Date   Anemia    Anxiety    Back pain    complains when laying on hard flat surface   Chronic kidney disease    appt. /w urology 11/10/2014- for a cyst seen on [place]. evaluated was told it was nothing.   Diabetes mellitus    Diabetes II, oral and GLP-1.   DVT (deep venous thrombosis) (HCC)    right leg '09 or '10   Fibroids    GERD (gastroesophageal reflux disease)    Heart murmur    History of kidney stones    History of stress test    referred for cardiac cath- done here at [facility]- 09/2014   Hypercholesterolemia    Hypertension    Neuromuscular disorder (HCC)    neuropathy   PONV (postoperative nausea and vomiting)    Seizures (HCC)    02/01/23- last seizure approximately 2015;   Sleep apnea    using cpap as of 05/23/23   Stroke [facility])    '09-had blockage in brain, too deep to operate"occ left lower extremity fatique, occ. 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   Past Surgical History:  Procedure Laterality Date   APPENDECTOMY     BIOPSY  11/18/2021   Procedure: BIOPSY;  Surgeon: Dolores Frame, MD;  [facility];  Service: Gastroenterology;;  random colon;   BIOPSY  12/27/2021   Procedure: BIOPSY;  Surgeon: Dolores Frame, MD;  [facility]: [facility];  Service: Gastroenterology;;    CARDIAC CATHETERIZATION     CESAREAN SECTION  1986 & 1988   x2   CHOLECYSTECTOMY     open"gallstones"   COLONOSCOPY WITH PROPOFOL N/A 11/18/2021   Procedure: COLONOSCOPY WITH PROPOFOL;  Surgeon: Dolores Frame, MD;  [facility]: [facility];  Service: Gastroenterology;  Laterality: N/A;  1215   DILATATION & CURETTAGE/HYSTEROSCOPY WITH MYOSURE N/A 05/10/2017   Procedure: DILATATION & CURETTAGE/HYSTEROSCOPY WITH MYOSURE;  Surgeon: Maxie Better, MD;  Location: [facility];  Service: Gynecology;  Laterality: N/A;   ESOPHAGOGASTRODUODENOSCOPY (EGD) WITH PROPOFOL N/A 12/27/2021   Procedure: ESOPHAGOGASTRODUODENOSCOPY (EGD) WITH PROPOFOL;  Surgeon: Marguerita Merles, Reuel Boom, MD;  [facility];  Service: Gastroenterology;  Laterality: N/A;  100   ESOPHAGOGASTRODUODENOSCOPY (EGD) WITH PROPOFOL N/A 01/13/2022   Procedure: ESOPHAGOGASTRODUODENOSCOPY (EGD) WITH PROPOFOL;  Surgeon: Dolores Frame, MD;  [facility];  Service: Gastroenterology;  Laterality: N/A;  235, per Soledad Gerlach pt knows to arrive at 6:15   HEMOSTASIS CLIP PLACEMENT  01/13/2022   Procedure: HEMOSTASIS CLIP PLACEMENT;  Surgeon: Dolores Frame, MD;  [facility];  Service: Gastroenterology;;   INCISION AND DRAINAGE ABSCESS  02/23/2023   Procedure: INCISION AND DRAINAGE OF PERI RECTAL ABSCESS;  Surgeon: Andria Meuse, MD;  Location[facility];  Service: General;;   INTERVENTIONAL RADIOLOGY PROCEDURE     CT angiography /neck -Dr. Corliss Skains.   IR ANGIO INTRA EXTRACRAN SEL COM CAROTID INNOMINATE BILAT MOD SED  02/22/2018   IR ANGIO INTRA EXTRACRAN SEL COM CAROTID INNOMINATE BILAT MOD SED  03/08/2022   IR ANGIO VERTEBRAL SEL VERTEBRAL BILAT MOD SED  02/22/2018   IR ANGIO VERTEBRAL SEL VERTEBRAL UNI R MOD SED  03/08/2022   IR GENERIC HISTORICAL  08/03/2016   IR RADIOLOGIST EVAL & MGMT 08/03/2016 [facility]   IR RADIOLOGIST EVAL & MGMT  01/03/2022   LAPAROSCOPIC  GASTRIC SLEEVE RESECTION N/A 04/10/2016   Procedure: LAPAROSCOPIC GASTRIC SLEEVE RESECTION WITH UPPER ENDO;  Surgeon: Gaynelle Adu, MD;  Location: [facility];  Service: General;  Laterality: N/A;   LEFT HEART CATHETERIZATION WITH CORONARY ANGIOGRAM N/A 09/28/2014   Procedure: LEFT HEART CATHETERIZATION WITH CORONARY ANGIOGRAM;  Surgeon: Corky Crafts, MD;  Location: [facility] CATH LAB;  Service: Cardiovascular;  Laterality: N/A;   LIGATION OF INTERNAL FISTULA TRACT N/A 05/24/2023   Procedure: LIGATION OF INTERSPHINCTERIC FISTULOUS TRACT;  Surgeon: Andria Meuse, MD;  [facility];  Service: General;  Laterality: N/A;  90   NEPHROLITHOTOMY Right 02/01/2022   Procedure: RIGHT NEPHROLITHOTOMY PERCUTANEOUS WITH SURGEON ACCESS;  Surgeon: Crist Fat, MD;  Location: [facility];  Service: Urology;  Laterality: Right;   NEPHROLITHOTOMY Left 02/15/2022   Procedure: LEFT NEPHROLITHOTOMY PERCUTANEOUS WITH SURGEON ACCESS, REMOVAL OF RIGHT URETERAL STENT;  Surgeon: Crist Fat, MD;  Location: [facility];  Service: Urology;  Laterality: Left;  195 MINUTES   PLACEMENT OF SETON N/A 02/23/2023   Procedure: SURGICAL TREATMENT OF ANAL FISTULA-TRANSSPHINTERIL WITH PLACEMENT OF SETON;  Surgeon: Andria Meuse, MD;  Location: [facility];  Service: General;  Laterality: N/A;   POLYPECTOMY  01/13/2022   Procedure: POLYPECTOMY;  Surgeon: Dolores Frame, MD;  [facility];  Service: Gastroenterology;;   RADIOLOGY WITH ANESTHESIA N/A 10/14/2014   Procedure: Mingo Amber;  Surgeon: Julieanne Cotton, MD;  Location: [facility];  Service: Radiology;  Laterality: N/A;   RECTAL EXAM UNDER ANESTHESIA N/A 05/24/2023   Procedure: ANORECTAL EXAM UNDER ANESTHESIA;  Surgeon: Andria Meuse, MD;  Location: [facility];  Service: General;  Laterality: N/A;   TUBAL LIGATION     FAMILY HISTORY Family History  Problem Relation Age of Onset   Cataracts  Mother    Kidney disease Mother        dialysis for 26 years   Heart attack Mother        83's   Diabetes Father    Cataracts Father    Heart failure Father    Heart disease Father    Heart attack Father        64's  Stroke Paternal Grandmother    SOCIAL HISTORY Social History   Tobacco Use   Smoking status: Every Day    Current packs/day: 0.25    Average packs/day: 0.3 packs/day for 48.1 years (12.0 ttl pk-yrs)    Types: Cigarettes    Start date: 07/25/1975    Passive exposure: Current   Smokeless tobacco: Never  Vaping Use   Vaping status: Never Used  Substance Use Topics   Alcohol use: Never   Drug use: No       OPHTHALMIC EXAM:  Base Eye Exam     Visual Acuity (Snellen - Linear)       Right Left   Dist cc 20/25 20/40 -3   Dist ph cc NI 20/40 +1         Tonometry (Tonopen, 9:45 AM)       Right Left   Pressure 15 18         Pupils       Pupils Dark Light Shape React APD   Right PERRL 2 1 Round Brisk None   Left PERRL 2 1 Round Brisk None         Visual Fields       Left Right    Full Full         Extraocular Movement       Right Left    Full, Ortho Full, Ortho         Neuro/Psych     Oriented x3: Yes   Mood/Affect: Normal         Dilation     Both eyes: 2.5% Phenylephrine @ 9:45 AM           Slit Lamp and Fundus Exam     Slit Lamp Exam       Right Left   Lids/Lashes Dermatochalasis - upper lid Dermatochalasis - upper lid   Conjunctiva/Sclera mild melanosis mild melanosis   Cornea tear film debris tear film debris   Anterior Chamber deep and clear deep and clear   Iris Round and dilated, No NVI Round and dilated, No NVI   Lens 2+ Nuclear sclerosis, 2-3+ Cortical cataract 2+ Nuclear sclerosis, 2-3+ Cortical cataract, 2+ Posterior subcapsular cataract   Anterior Vitreous Vitreous syneresis, stable improvement in vitreous and subhyaloid heme, trace white VH remnants settled inferiorly Vitreous syneresis          Fundus Exam       Right Left   Disc Pink and Sharp, no NVD, +fibrosis Pink and Sharp, +cupping   C/D Ratio 0.5 0.65   Macula Flat, Good foveal reflex, RPE mottling and clumping, No heme or edema Flat, Good foveal reflex, RPE mottling and clumping, scattered MA/DBH -- improved, no frank edema   Vessels attenuated, Tortuous, early patches of NVE inferior midzone -- regressed attenuated, tortuous, early midzonal NVE   Periphery Attached, boat shaped subhyaloid heme inferior midzone increased and more diffuse Attached, scattered MA, white now boat shaped subhayloid / pre retinal heme inferior midzone -- improving, good PRP changes nasal hemisphere           Refraction     Wearing Rx       Sphere Cylinder Axis   Right -2.00 +0.50 178   Left -2.00 +0.75 014           IMAGING AND PROCEDURES  Imaging and Procedures for 08/21/2023  OCT, Retina - OU - Both Eyes       Right Eye Quality was good. Central Foveal Thickness:  209. Progression has been stable. Findings include normal foveal contour, no IRF, no SRF, intraretinal hyper-reflective material (Partial PVD with mild stable improvement in vitreous opacities).   Left Eye Quality was good. Central Foveal Thickness: 217. Progression has been stable. Findings include normal foveal contour, no IRF, no SRF, vitreomacular adhesion (No DME, Mild diffuse atrophy, stable improvement in pre-retinal hyper reflective material along inferior arcades, stable improvement in vitreous opacities).   Notes *Images captured and stored on drive  Diagnosis / Impression:  No DME OU OD: Partial PVD with mild stable improvement in vitreous opacities OS: No DME, Mild diffuse atrophy, stable improvement in pre-retinal hyper reflective material along inferior arcades, stable improvement in vitreous opacities  Clinical management:  See below  Abbreviations: NFP - Normal foveal profile. CME - cystoid macular edema. PED - pigment epithelial detachment.  IRF - intraretinal fluid. SRF - subretinal fluid. EZ - ellipsoid zone. ERM - epiretinal membrane. ORA - outer retinal atrophy. ORT - outer retinal tubulation. SRHM - subretinal hyper-reflective material. IRHM - intraretinal hyper-reflective material           ASSESSMENT/PLAN:   ICD-10-CM   1. 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 Subhyaloid hemorrhage of both eyes  H35.63     3. Long term (current) use of oral hypoglycemic drugs  Z79.84     4. Long-term (current) use of injectable non-insulin antidiabetic drugs  Z79.85     5. Essential hypertension  I10     6. Hypertensive retinopathy of both eyes  H35.033     7. Combined forms of age-related cataract of both eyes  H25.813      1-4. Proliferative diabetic retinopathy w/o DME, OU - s/p IVA OD #1 (05.23.24), #2 (06.24.24), #3 (06.24.24), #4 (08.20.24), #5 (09.17.24), #6 (10.15.24),  - s/p IVA OS #1 (06.24.24), #2 (06.24.24), #3 (08.20.24), #4 (09.17.24), #5 (10.15.24), #6 (12.03.24) - s/p PRP OS (06.03.24) -- incomplete / aborted due to history of photogenic seizures  - mild VH improving OU - FA (05.23.24) shows OD: Boat shaped blockage inferior midzone from subhyaloid heme, focal NVE and vascular non-perfusion IN midzone; OS: Scattered punctate NVE greatest inferior midzone -- pt needs PRP OU - repeat FA (07.23.24): OD: low fluorescein signal, focal NVE and vascular non-perfusion IN midzone -- improved; OS: Low fluorescein signal, punctate NVE greatest inferior midzone -- improved, new boat shaped heme inferior to disc - repeat FA (10.15.24) OD: focal NVE and vascular non-perfusion IN midzone -- improved w/ regression of NV, no leakage; OS: punctate NVE greatest inferior midzone -- resolved, boat shaped heme inferior to disc- resolved - OCT shows OD: Partial PVD with mild stable improvement in vitreous opacities;  OS: No DME, Mild diffuse atrophy, stable improvement in pre-retinal hyper reflective material along inferior arcades, stable improvement in vitreous / subhyaloid opacities at 8 wks - recommend holding tx today OU - pt in agreement - informed consent obtained and signed 05.23.24 (OU) - see procedure note - f/u 4-6 weeks -- DFE/OCT, possible injxns, repeat FA transit OD  5,6. Hypertensive retinopathy OU - discussed importance of tight BP control - monitor  7. Mixed Cataract OU - The symptoms of cataract, surgical options, and treatments and risks were discussed with patient. - discussed diagnosis and progression - monitor  8. Hx of photogenic seizures  Ophthalmic Meds Ordered this visit:  No orders of the defined  types were placed in this encounter.    Return for f/u 4-6 weeks, PDR OU, DFE, OCT, Fluorescein Angiogram.  There are no Patient Instructions on file for this visit.  Explained the diagnoses, plan, and follow up with the patient and they expressed understanding.  Patient expressed understanding of the importance of proper follow up care.   This document serves as a record of services personally performed by Karie Chimera, MD, PhD. It was created on their behalf by Charlette Caffey, COT an ophthalmic technician. The creation of this record is the provider's dictation and/or activities during the visit.    Electronically signed by:  Charlette Caffey, COT  08/23/23 12:34 PM  This document serves as a record of services personally performed by Karie Chimera, MD, PhD. It was created on their behalf by Glee Arvin. Manson Passey, OA an ophthalmic technician. The creation of this record is the provider's dictation and/or activities during the visit.    Electronically signed by: Glee Arvin. Manson Passey, OA 08/23/23 12:34 PM  Karie Chimera, M.D., Ph.D. Diseases & Surgery of the Retina and Vitreous [facility]  I have reviewed the above documentation for accuracy  and completeness, and I agree with the above. Karie Chimera, M.D., Ph.D. 08/23/23 12:35 PM   Abbreviations: M myopia (nearsighted); A astigmatism; H hyperopia (farsighted); P presbyopia; Mrx spectacle prescription;  CTL contact lenses; OD right eye; OS left eye; OU both eyes  XT exotropia; ET esotropia; PEK punctate epithelial keratitis; PEE punctate epithelial erosions; DES dry eye syndrome; MGD meibomian gland dysfunction; ATs artificial tears; PFAT's preservative free artificial tears; NSC nuclear sclerotic cataract; PSC posterior subcapsular cataract; ERM epi-retinal membrane; PVD posterior vitreous detachment; RD retinal detachment; DM diabetes mellitus; DR diabetic retinopathy; NPDR non-proliferative diabetic retinopathy; PDR proliferative diabetic retinopathy; CSME clinically significant macular edema; DME diabetic macular edema; dbh dot blot hemorrhages; CWS cotton wool spot; POAG primary open angle glaucoma; C/D cup-to-disc ratio; HVF humphrey visual field; GVF goldmann visual field; OCT optical coherence tomography; IOP intraocular pressure; BRVO Branch retinal vein occlusion; CRVO central retinal vein occlusion; CRAO central retinal artery occlusion; BRAO branch retinal artery occlusion; RT retinal tear; SB scleral buckle; PPV pars plana vitrectomy; VH Vitreous hemorrhage; PRP panretinal laser photocoagulation; IVK intravitreal kenalog; VMT vitreomacular traction; MH Macular hole;  NVD neovascularization of the disc; NVE neovascularization elsewhere; AREDS age related eye disease study; ARMD age related macular degeneration; POAG primary open angle glaucoma; EBMD epithelial/anterior basement membrane dystrophy; ACIOL anterior chamber intraocular lens; IOL intraocular lens; PCIOL posterior chamber intraocular lens; Phaco/IOL phacoemulsification with intraocular lens placement; PRK photorefractive keratectomy; LASIK laser assisted in situ keratomileusis; HTN hypertension; DM diabetes mellitus; COPD  chronic obstructive pulmonary disease

## 2023-08-15 ENCOUNTER — Encounter: Payer: Self-pay | Admitting: Orthopedic Surgery

## 2023-08-15 ENCOUNTER — Ambulatory Visit (INDEPENDENT_AMBULATORY_CARE_PROVIDER_SITE_OTHER): Payer: BC Managed Care – PPO | Admitting: Orthopedic Surgery

## 2023-08-15 VITALS — BP 158/82 | HR 70 | Ht 66.5 in | Wt 218.0 lb

## 2023-08-15 DIAGNOSIS — M545 Low back pain, unspecified: Secondary | ICD-10-CM | POA: Diagnosis not present

## 2023-08-15 NOTE — Patient Instructions (Signed)

## 2023-08-15 NOTE — Progress Notes (Signed)
Return patient Visit  Assessment: Jill [name] is a 58 y.o. female with the following: 1. Acute bilateral low back pain without sciatica  Plan: LOREANE TEATS continues to have pain in her lower back.  Prednisone improved her pain, but this was not sustained.  Radiographs have been negative.  No signs of nerve compression.  She has excellent lower body strength.  I recommended physical therapy.  In the interim, she will do exercises at home.  If she continues to have difficulty, we will consider obtaining an MRI.  It is possible that she has sustained an occult fracture after her fall.   Follow-up: Return if symptoms worsen or fail to improve.  Subjective:  Chief Complaint  Patient presents with   Back Pain    States pain isn't constant now but when the pain is there it's still as intense. Still not sure what causes the intense pain but would like to know what she can do at this point to prevent it. Does use OTC Back and Body medications that keeps her pain down so she can work.     History of Present Illness: Jill [name] is a 58 y.o. female who returns for evaluation of low back pain.  She fell approximately 1 month ago, and has had pain in the lower back since.  She was out of work for couple weeks.  She completed a course of prednisone.  She continues to have pain.  She does notice some stiffness as she is seated for longer periods of time.  She denies numbness and tingling.  No radiating pains.  She has questions about the x-ray results, and is specifically asking about scoliosis as well as degenerative disc disease.  She states that she was diagnosed with scoliosis when she was very young.   Review of Systems: No fevers or chills No numbness or tingling No chest pain No shortness of breath No bowel or bladder dysfunction No GI distress No headaches    Objective: BP (!) 158/82   Pulse 70   Ht 5' 6.5" (1.689 m)   Wt 218 lb (98.9 kg)   LMP 12/10/2014 Comment: pt  states that there is no chance or pregnancy  BMI 34.66 kg/m   Physical Exam:  General: Alert and oriented. and No acute distress. Gait: Left sided antalgic gait.  Low back without deformity.  Tenderness in the mid lower back. Negative straight leg raise.  Toes are WWP.  Sensation intact in BLE.  Good strength in BLE .  IMAGING: I personally reviewed images previously obtained in clinic  No new GYN obtained today.    New Medications:  No orders of the defined types were placed in this encounter.     Oliver Barre, MD  08/15/2023 10:08 AM

## 2023-08-15 NOTE — Addendum Note (Signed)
Addended by: Baird Kay on: 08/15/2023 10:19 AM   Modules accepted: Orders

## 2023-08-21 ENCOUNTER — Encounter (INDEPENDENT_AMBULATORY_CARE_PROVIDER_SITE_OTHER): Payer: Self-pay | Admitting: Ophthalmology

## 2023-08-21 ENCOUNTER — Ambulatory Visit (INDEPENDENT_AMBULATORY_CARE_PROVIDER_SITE_OTHER): Payer: BC Managed Care – PPO | Admitting: Ophthalmology

## 2023-08-21 DIAGNOSIS — Z7985 Long-term (current) use of injectable non-insulin antidiabetic drugs: Secondary | ICD-10-CM | POA: Diagnosis not present

## 2023-08-21 DIAGNOSIS — H3563 Retinal hemorrhage, bilateral: Secondary | ICD-10-CM | POA: Diagnosis not present

## 2023-08-21 DIAGNOSIS — E113593 Type 2 diabetes mellitus with proliferative diabetic retinopathy without macular edema, bilateral: Secondary | ICD-10-CM | POA: Diagnosis not present

## 2023-08-21 DIAGNOSIS — Z7984 Long term (current) use of oral hypoglycemic drugs: Secondary | ICD-10-CM

## 2023-08-21 DIAGNOSIS — I10 Essential (primary) hypertension: Secondary | ICD-10-CM

## 2023-08-21 DIAGNOSIS — H35033 Hypertensive retinopathy, bilateral: Secondary | ICD-10-CM

## 2023-08-21 DIAGNOSIS — H25813 Combined forms of age-related cataract, bilateral: Secondary | ICD-10-CM

## 2023-08-23 ENCOUNTER — Encounter (INDEPENDENT_AMBULATORY_CARE_PROVIDER_SITE_OTHER): Payer: Self-pay | Admitting: Ophthalmology

## 2023-08-29 DIAGNOSIS — M6281 Muscle weakness (generalized): Secondary | ICD-10-CM | POA: Diagnosis not present

## 2023-08-29 DIAGNOSIS — M545 Low back pain, unspecified: Secondary | ICD-10-CM | POA: Diagnosis not present

## 2023-08-31 DIAGNOSIS — M545 Low back pain, unspecified: Secondary | ICD-10-CM | POA: Diagnosis not present

## 2023-08-31 DIAGNOSIS — M6281 Muscle weakness (generalized): Secondary | ICD-10-CM | POA: Diagnosis not present

## 2023-09-03 DIAGNOSIS — M545 Low back pain, unspecified: Secondary | ICD-10-CM | POA: Diagnosis not present

## 2023-09-03 DIAGNOSIS — M6281 Muscle weakness (generalized): Secondary | ICD-10-CM | POA: Diagnosis not present

## 2023-09-11 ENCOUNTER — Ambulatory Visit (HOSPITAL_COMMUNITY)
Admission: RE | Admit: 2023-09-11 | Discharge: 2023-09-11 | Disposition: A | Discharge status: Home / Self Care | Payer: Medicare Other | Source: Ambulatory Visit | Attending: Interventional Radiology | Admitting: Interventional Radiology

## 2023-09-11 DIAGNOSIS — I771 Stricture of artery: Secondary | ICD-10-CM | POA: Diagnosis present

## 2023-09-11 DIAGNOSIS — I668 Occlusion and stenosis of other cerebral arteries: Secondary | ICD-10-CM | POA: Insufficient documentation

## 2023-09-11 DIAGNOSIS — I6521 Occlusion and stenosis of right carotid artery: Secondary | ICD-10-CM | POA: Diagnosis not present

## 2023-09-14 ENCOUNTER — Other Ambulatory Visit (HOSPITAL_COMMUNITY): Payer: Self-pay | Admitting: Interventional Radiology

## 2023-09-14 DIAGNOSIS — I771 Stricture of artery: Secondary | ICD-10-CM

## 2023-09-17 ENCOUNTER — Encounter (HOSPITAL_COMMUNITY): Payer: Self-pay

## 2023-09-17 ENCOUNTER — Other Ambulatory Visit (HOSPITAL_COMMUNITY): Payer: Self-pay | Admitting: Radiology

## 2023-09-17 DIAGNOSIS — I771 Stricture of artery: Secondary | ICD-10-CM

## 2023-09-18 ENCOUNTER — Other Ambulatory Visit (HOSPITAL_COMMUNITY): Payer: Self-pay | Admitting: Interventional Radiology

## 2023-09-18 ENCOUNTER — Ambulatory Visit (HOSPITAL_COMMUNITY)
Admission: RE | Admit: 2023-09-18 | Discharge: 2023-09-18 | Disposition: A | Discharge status: Home / Self Care | Payer: Medicare Other | Source: Ambulatory Visit | Attending: Interventional Radiology | Admitting: Interventional Radiology

## 2023-09-18 ENCOUNTER — Other Ambulatory Visit: Payer: Self-pay

## 2023-09-18 DIAGNOSIS — I771 Stricture of artery: Secondary | ICD-10-CM

## 2023-09-18 DIAGNOSIS — F17210 Nicotine dependence, cigarettes, uncomplicated: Secondary | ICD-10-CM | POA: Diagnosis not present

## 2023-09-18 DIAGNOSIS — I6522 Occlusion and stenosis of left carotid artery: Secondary | ICD-10-CM | POA: Insufficient documentation

## 2023-09-18 HISTORY — PX: IR ANGIO INTRA EXTRACRAN SEL COM CAROTID INNOMINATE BILAT MOD SED: IMG5360

## 2023-09-18 HISTORY — PX: IR ANGIO VERTEBRAL SEL SUBCLAVIAN INNOMINATE UNI L MOD SED: IMG5364

## 2023-09-18 HISTORY — PX: IR ANGIO VERTEBRAL SEL VERTEBRAL UNI R MOD SED: IMG5368

## 2023-09-18 LAB — CBC
HCT: 39.1 % (ref 36.0–46.0)
Hemoglobin: 13 g/dL (ref 12.0–15.0)
MCH: 31.6 pg (ref 26.0–34.0)
MCHC: 33.2 g/dL (ref 30.0–36.0)
MCV: 95.1 fL (ref 80.0–100.0)
Platelets: 194 10*3/uL (ref 150–400)
RBC: 4.11 MIL/uL (ref 3.87–5.11)
RDW: 11.8 % (ref 11.5–15.5)
WBC: 9.5 10*3/uL (ref 4.0–10.5)
nRBC: 0 % (ref 0.0–0.2)

## 2023-09-18 LAB — GLUCOSE, CAPILLARY
Glucose-Capillary: 130 mg/dL — ABNORMAL HIGH (ref 70–99)
Glucose-Capillary: 139 mg/dL — ABNORMAL HIGH (ref 70–99)

## 2023-09-18 LAB — BASIC METABOLIC PANEL
Anion gap: 6 (ref 5–15)
BUN: 20 mg/dL (ref 6–20)
CO2: 28 mmol/L (ref 22–32)
Calcium: 10.2 mg/dL (ref 8.9–10.3)
Chloride: 107 mmol/L (ref 98–111)
Creatinine, Ser: 1.02 mg/dL — ABNORMAL HIGH (ref 0.44–1.00)
GFR, Estimated: >60 mL/min (ref >60)
Glucose, Bld: 129 mg/dL — ABNORMAL HIGH (ref 70–99)
Potassium: 4.1 mmol/L (ref 3.5–5.1)
Sodium: 141 mmol/L (ref 135–145)

## 2023-09-18 LAB — PROTIME-INR
INR: 0.9 (ref 0.8–1.2)
Prothrombin Time: 12.8 s (ref 11.4–15.2)

## 2023-09-18 MED ORDER — LIDOCAINE HCL 1 % IJ SOLN
INTRAMUSCULAR | Status: AC
  Filled 2023-09-18: qty 20

## 2023-09-18 MED ORDER — DIPHENHYDRAMINE HCL 50 MG/ML IJ SOLN
INTRAMUSCULAR | Status: AC
  Filled 2023-09-18: qty 1

## 2023-09-18 MED ORDER — HEPARIN SODIUM (PORCINE) 1000 UNIT/ML IJ SOLN
INTRAMUSCULAR | Status: AC
  Filled 2023-09-18: qty 10

## 2023-09-18 MED ORDER — IOHEXOL 300 MG/ML  SOLN
150.0000 mL | Freq: Once | INTRAMUSCULAR | Status: AC | PRN
  Administered 2023-09-18: 84 mL via INTRA_ARTERIAL

## 2023-09-18 MED ORDER — FENTANYL CITRATE (PF) 100 MCG/2ML IJ SOLN
INTRAMUSCULAR | Status: AC
  Filled 2023-09-18: qty 2

## 2023-09-18 MED ORDER — KETOROLAC TROMETHAMINE 30 MG/ML IJ SOLN
30.0000 mg | Freq: Once | INTRAMUSCULAR | Status: AC
  Administered 2023-09-18: 30 mg via INTRAVENOUS
  Filled 2023-09-18: qty 1

## 2023-09-18 MED ORDER — MIDAZOLAM HCL 2 MG/2ML IJ SOLN
INTRAMUSCULAR | Status: AC | PRN
  Administered 2023-09-18: 1 mg via INTRAVENOUS

## 2023-09-18 MED ORDER — HEPARIN SODIUM (PORCINE) 1000 UNIT/ML IJ SOLN
INTRAMUSCULAR | Status: AC | PRN
  Administered 2023-09-18: 1000 [IU] via INTRAVENOUS

## 2023-09-18 MED ORDER — FENTANYL CITRATE (PF) 100 MCG/2ML IJ SOLN
INTRAMUSCULAR | Status: AC | PRN
  Administered 2023-09-18: 25 ug via INTRAVENOUS

## 2023-09-18 MED ORDER — MIDAZOLAM HCL 2 MG/2ML IJ SOLN
INTRAMUSCULAR | Status: AC
  Filled 2023-09-18: qty 2

## 2023-09-18 MED ORDER — SODIUM CHLORIDE 0.9 % IV SOLN: INTRAVENOUS | Status: AC

## 2023-09-18 MED ORDER — SODIUM CHLORIDE 0.9 % IV SOLN: INTRAVENOUS | Status: DC

## 2023-09-18 MED ORDER — LIDOCAINE HCL 1 % IJ SOLN
20.0000 mL | Freq: Once | INTRAMUSCULAR | Status: AC
  Administered 2023-09-18: 10 mL via INTRADERMAL

## 2023-09-18 NOTE — Procedures (Addendum)
 INR  Status post bilateral common carotid arteriograms, right vertebral arteriogram and left subclavian arteriogram.  Right CFA approach.  Findings.  1.  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  2.  Moderate approximately 50% stenosis of the left internal carotid artery supraclinoid segment..   Patient tolerated  the procedure well.  There were no acute complications.    S.Ceniyah Thorp MD

## 2023-09-18 NOTE — Progress Notes (Signed)
 Patient transported to short stay with this RN. Right femoral site assessed with Cristy Friedlander, Charity fundraiser. Right Fem site level 0, clean, dry, intact. No hematoma.

## 2023-09-18 NOTE — Discharge Instructions (Addendum)
 INR.  Resume metformin on Thursday 27 th .  S.Deveshwar MD   Femoral Site Care This sheet gives you information about how to care for yourself after your procedure. Your health care provider may also give you more specific instructions. If you have problems or questions, contact your health care provider. What can I expect after the procedure?  After the procedure, it is common to have: Bruising that usually fades within 1-2 weeks. Tenderness at the site. Follow these instructions at home: Wound care Follow instructions from your health care provider about how to take care of your insertion site. Make sure you: Wash your hands with soap and water before you change your bandage (dressing). If soap and water are not available, use hand sanitizer. Remove your dressing as told by your health care provider. 24 hours Do not take baths, swim, or use a hot tub until your health care provider approves. You may shower 24-48 hours after the procedure or as told by your health care provider. Gently wash the site with plain soap and water. Pat the area dry with a clean towel. Do not rub the site. This may cause bleeding. Do not apply powder or lotion to the site. Keep the site clean and dry. Check your femoral site every day for signs of infection. Check for: Redness, swelling, or pain. Fluid or blood. Warmth. Pus or a bad smell. Activity For the first 2-3 days after your procedure, or as long as directed: Avoid climbing stairs as much as possible. Do not squat. Do not lift anything that is heavier than 10 lb (4.5 kg), or the limit that you are told, until your health care provider says that it is safe. For 5 days Rest as directed. Avoid sitting for a long time without moving. Get up to take short walks every 1-2 hours. Do not drive for 24 hours if you were given a medicine to help you relax (sedative). General instructions Take over-the-counter and prescription medicines only as told by  your health care provider. Keep all follow-up visits as told by your health care provider. This is important. Contact a health care provider if you have: A fever or chills. You have redness, swelling, or pain around your insertion site. Get help right away if: The catheter insertion area swells very fast. You pass out. You suddenly start to sweat or your skin gets clammy. The catheter insertion area is bleeding, and the bleeding does not stop when you hold steady pressure on the area. The area near or just beyond the catheter insertion site becomes pale, cool, tingly, or numb. These symptoms may represent a serious problem that is an emergency. Do not wait to see if the symptoms will go away. Get medical help right away. Call your local emergency services (911 in the [place] Do not drive yourself to the hospital. Summary After the procedure, it is common to have bruising that usually fades within 1-2 weeks. Check your femoral site every day for signs of infection. Do not lift anything that is heavier than 10 lb (4.5 kg), or the limit that you are told, until your health care provider says that it is safe. This information is not intended to replace advice given to you by your health care provider. Make sure you discuss any questions you have with your health care provider. Document Revised: 07/23/2017 Document Reviewed: 07/23/2017 Elsevier Patient Education  2020 [employer].

## 2023-09-18 NOTE — H&P (Signed)
 Chief Complaint: Patient was seen in consultation today for cerebral angiogram   Referring Physician(s): Deveshwar,Sanjeev  Supervising Physician: Julieanne Cotton  Patient Status: [facility] - Out-pt  History of Present Illness: Jill [name] is a 58 y.o. 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 She has been followed by Dr. Corliss Skains for several years.  She is scheduled for formal cerebral angiogram today.  PMHx, meds, labs, imaging, allergies reviewed. Feels well, no recent fevers, chills, illness. Has been NPO today as directed.   Past Medical History:  Diagnosis Date   Anemia    Anxiety    Back pain    complains when laying on hard flat surface   Chronic kidney disease    appt. /w urology 11/10/2014- for a cyst seen on [place]. evaluated was told it was nothing.   Diabetes mellitus    Diabetes II, oral and GLP-1.   DVT (deep venous thrombosis) (HCC)    right leg '09 or '10   Fibroids    GERD (gastroesophageal reflux disease)    Heart murmur    History of kidney stones    History of stress test    referred for cardiac cath- done here at [facility]- 09/2014   Hypercholesterolemia    Hypertension    Neuromuscular disorder (HCC)    neuropathy   PONV (postoperative nausea and vomiting)    Seizures (HCC)    02/01/23- last seizure approximately 2015;   Sleep apnea    using cpap as of 05/23/23   Stroke [facility])    '09-had blockage in brain, too deep to operate"occ left lower extremity fatique, occ. 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    Past Surgical History:  Procedure Laterality Date   APPENDECTOMY     BIOPSY  11/18/2021   Procedure: BIOPSY;  Surgeon: Dolores Frame, MD;  [facility];  Service: Gastroenterology;;  random colon;   BIOPSY  12/27/2021   Procedure: BIOPSY;  Surgeon: Dolores Frame, MD;  [facility];   Service: Gastroenterology;;   CARDIAC CATHETERIZATION     CESAREAN SECTION  1986 & 1988   x2   CHOLECYSTECTOMY     open"gallstones"   COLONOSCOPY WITH PROPOFOL N/A 11/18/2021   Procedure: COLONOSCOPY WITH PROPOFOL;  Surgeon: Dolores Frame, MD;  [facility];  Service: Gastroenterology;  Laterality: N/A;  1215   DILATATION & CURETTAGE/HYSTEROSCOPY WITH MYOSURE N/A 05/10/2017   Procedure: DILATATION & CURETTAGE/HYSTEROSCOPY WITH MYOSURE;  Surgeon: Maxie Better, MD;  Location: [facility];  Service: Gynecology;  Laterality: N/A;   ESOPHAGOGASTRODUODENOSCOPY (EGD) WITH PROPOFOL N/A 12/27/2021   Procedure: ESOPHAGOGASTRODUODENOSCOPY (EGD) WITH PROPOFOL;  Surgeon: Dolores Frame, MD;  [facility];  Service: Gastroenterology;  Laterality: N/A;  100   ESOPHAGOGASTRODUODENOSCOPY (EGD) WITH PROPOFOL N/A 01/13/2022   Procedure: ESOPHAGOGASTRODUODENOSCOPY (EGD) WITH PROPOFOL;  Surgeon: Dolores Frame, MD;  [facility];  Service: Gastroenterology;  Laterality: N/A;  235, per Soledad Gerlach pt knows to arrive at 6:15   HEMOSTASIS CLIP PLACEMENT  01/13/2022   Procedure: HEMOSTASIS CLIP PLACEMENT;  Surgeon: Dolores Frame, MD;  [facility];  Service: Gastroenterology;;   INCISION AND DRAINAGE ABSCESS  02/23/2023   Procedure: INCISION AND DRAINAGE OF PERI RECTAL ABSCESS;  Surgeon: Andria Meuse, MD;  Location: [facility] ;  Service: General;;   INTERVENTIONAL RADIOLOGY PROCEDURE  CT angiography /neck -Dr. Corliss Skains.   IR ANGIO INTRA EXTRACRAN SEL COM CAROTID INNOMINATE BILAT MOD SED  02/22/2018   IR ANGIO INTRA EXTRACRAN SEL COM CAROTID INNOMINATE BILAT MOD SED  03/08/2022   IR ANGIO VERTEBRAL SEL VERTEBRAL BILAT MOD SED  02/22/2018   IR ANGIO VERTEBRAL SEL VERTEBRAL UNI R MOD SED  03/08/2022   IR GENERIC HISTORICAL  08/03/2016   IR RADIOLOGIST EVAL & MGMT 08/03/2016 [facility]INTER[facility]   IR RADIOLOGIST EVAL & MGMT   01/03/2022   LAPAROSCOPIC GASTRIC SLEEVE RESECTION N/A 04/10/2016   Procedure: LAPAROSCOPIC GASTRIC SLEEVE RESECTION WITH UPPER ENDO;  Surgeon: Gaynelle Adu, MD;  Location: [facility];  Service: General;  Laterality: N/A;   LEFT HEART CATHETERIZATION WITH CORONARY ANGIOGRAM N/A 09/28/2014   Procedure: LEFT HEART CATHETERIZATION WITH CORONARY ANGIOGRAM;  Surgeon: Corky Crafts, MD;  Location: [facility] CATH LAB;  Service: Cardiovascular;  Laterality: N/A;   LIGATION OF INTERNAL FISTULA TRACT N/A 05/24/2023   Procedure: LIGATION OF INTERSPHINCTERIC FISTULOUS TRACT;  Surgeon: Andria Meuse, MD;  Location: [facility];  Service: General;  Laterality: N/A;  90   NEPHROLITHOTOMY Right 02/01/2022   Procedure: RIGHT NEPHROLITHOTOMY PERCUTANEOUS WITH SURGEON ACCESS;  Surgeon: Crist Fat, MD;  Location: [facility];  Service: Urology;  Laterality: Right;   NEPHROLITHOTOMY Left 02/15/2022   Procedure: LEFT NEPHROLITHOTOMY PERCUTANEOUS WITH SURGEON ACCESS, REMOVAL OF RIGHT URETERAL STENT;  Surgeon: Crist Fat, MD;  Location: [facility];  Service: Urology;  Laterality: Left;  195 MINUTES   PLACEMENT OF SETON N/A 02/23/2023   Procedure: SURGICAL TREATMENT OF ANAL FISTULA-TRANSSPHINTERIL WITH PLACEMENT OF SETON;  Surgeon: Andria Meuse, MD;  Location: [facility];  Service: General;  Laterality: N/A;   POLYPECTOMY  01/13/2022   Procedure: POLYPECTOMY;  Surgeon: Dolores Frame, MD;  [facility];  Service: Gastroenterology;;   RADIOLOGY WITH ANESTHESIA N/A 10/14/2014   Procedure: Mingo Amber;  Surgeon: Julieanne Cotton, MD;  Location: [facility];  Service: Radiology;  Laterality: N/A;   RECTAL EXAM UNDER ANESTHESIA N/A 05/24/2023   Procedure: ANORECTAL EXAM UNDER ANESTHESIA;  Surgeon: Andria Meuse, MD;  Location: [facility];  Service: General;  Laterality: N/A;   TUBAL LIGATION      Allergies: Dilaudid [hydromorphone hcl], Crab  extract, Crab [shellfish allergy], Hydrocodone, and Other  Medications: Prior to Admission medications   Medication Sig Start Date End Date Taking? Authorizing Provider  acetaminophen (TYLENOL) 500 MG tablet Take 500-1,000 mg by mouth every 8 (eight) hours as needed for moderate pain or headache.     [provider]  aspirin EC 81 MG tablet Take 81 mg by mouth every morning.    [provider]  bismuth subsalicylate (PEPTO BISMOL) 262 MG chewable tablet Chew 524 mg by mouth 3 (three) times daily as needed (stomach pain.).    [provider]  Calcium-Phosphorus-Vitamin D (CITRACAL +D3 PO) Take 2 tablets by mouth in the morning and at bedtime.    [provider]  clopidogrel (PLAVIX) 75 MG tablet Take 75 mg by mouth in the morning.    [provider]  Cyanocobalamin (VITAMIN B-12) 5000 MCG SUBL Place 5,000 mcg under the tongue 3 (three) times a week.    [provider]  cyclobenzaprine (FLEXERIL) 10 MG tablet Take 1 tablet (10 mg total) by mouth 2 (two) times daily as needed. 07/11/23   Oliver Barre, MD  docusate sodium (COLACE) 100 MG capsule Take 100 mg by  mouth in the morning.    [provider]  ezetimibe (ZETIA) 10 MG tablet Take 1 tablet (10 mg total) by mouth daily. 04/24/23 07/23/23  Antoine Poche, MD  felodipine (PLENDIL) 5 MG 24 hr tablet Take 5 mg by mouth in the morning.    [provider]  furosemide (LASIX) 20 MG tablet Take 1 tablet (20 mg total) by mouth every other day. Patient taking differently: Take 20 mg by mouth every other day. Taking PRN for swelling 04/24/23 04/18/24  Antoine Poche, MD  gabapentin (NEURONTIN) 300 MG capsule Take 300 mg by mouth 2 (two) times daily. 01/07/16   [provider]  lamoTRIgine (LAMICTAL) 100 MG tablet Take 100 mg by mouth every morning.     [provider]  levETIRAcetam (KEPPRA) 500 MG tablet Take 250 mg by mouth 2 (two) times daily.    [provider]  linaclotide (LINZESS) 145 MCG CAPS capsule Take 145 mcg by mouth daily before breakfast.    [provider]  LORazepam (ATIVAN) 0.5 MG tablet Take 0.5 mg by mouth daily as needed for anxiety.    [provider]  metFORMIN (GLUCOPHAGE) 500 MG tablet Take 500 mg by mouth 2 (two) times daily. 09/27/22   [provider]  metoprolol succinate (TOPROL-XL) 25 MG 24 hr tablet Take 1 tablet by mouth daily 02/18/20   Dyann Kief, PA-C  Multiple Vitamins-Minerals (BARIATRIC MULTIVITAMINS/IRON PO) Take 1 tablet by mouth every evening.    [provider]  olmesartan (BENICAR) 40 MG tablet Take 40 mg by mouth every morning.     [provider]  OZEMPIC, 1 MG/DOSE, 2 MG/1.5ML SOPN Inject 1 mg into the skin every 30 (thirty) days. 12/18/19   [provider]  pantoprazole (PROTONIX) 40 MG tablet Take 1 tablet (40 mg total) by mouth 2 (two) times daily. 01/16/22   Dolores Frame, MD  pioglitazone (ACTOS) 30 MG tablet Take 30 mg by mouth every morning.    [provider]  potassium chloride (KLOR-CON M) 10 MEQ tablet TAKE 2 TABLETS BY MOUTH DAILY. 01/12/23   Antoine Poche, MD  predniSONE (STERAPRED UNI-PAK 21 TAB) 10 MG (21) TBPK tablet 10 mg DS 12 as directed Patient not taking: Reported on 08/15/2023 07/11/23   Oliver Barre, MD  rosuvastatin (CRESTOR) 40 MG tablet Take 1 tablet (40 mg total) by mouth at bedtime. 12/15/19   Dyann Kief, PA-C     Family History  Problem Relation Age of Onset   Cataracts Mother    Kidney disease Mother        dialysis for 26 years   Heart attack Mother        8's   Diabetes Father    Cataracts Father    Heart failure Father    Heart disease Father    Heart attack Father        7's   Stroke Paternal Grandmother     Social History   Socioeconomic History   Marital status: Married    Spouse name: Not on file   Number of children: Not on file   Years of education: Not  on file   Highest education level: Not on file  Occupational History   Not on file  Tobacco Use   Smoking status: Every Day    Current packs/day: 0.25    Average packs/day: 0.3 packs/day for 48.2 years (12.0 ttl pk-yrs)    Types: Cigarettes  Start date: 07/25/1975    Passive exposure: Current   Smokeless tobacco: Never  Vaping Use   Vaping status: Never Used  Substance and Sexual Activity   Alcohol use: Never   Drug use: No   Sexual activity: Yes    Birth control/protection: Other-see comments, Surgical    Comment: Tubal Ligation  Other Topics Concern   Not on file  Social History Narrative   Not on file   Social Drivers of Health   Financial Resource Strain: Not on file  Food Insecurity: Not on file  Transportation Needs: Not on file  Physical Activity: Not on file  Stress: Not on file  Social Connections: Not on file    Review of Systems: A 12 point ROS discussed and pertinent positives are indicated in the HPI above.  All other systems are negative.  Review of Systems  Vital Signs: LMP 12/10/2014 Comment: pt states that there is no chance or pregnancy  Physical Exam Constitutional:      Appearance: She is not ill-appearing.  HENT:     Mouth/Throat:     Mouth: Mucous membranes are moist.     Pharynx: Oropharynx is clear.  Cardiovascular:     Rate and Rhythm: Normal rate and regular rhythm.     Pulses: Normal pulses.     Heart sounds: Normal heart sounds.  Pulmonary:     Effort: Pulmonary effort is normal. No respiratory distress.     Breath sounds: Normal breath sounds.  Abdominal:     General: There is no distension.     Palpations: Abdomen is soft.  Skin:    General: Skin is warm and dry.  Neurological:     General: No focal deficit present.     Mental Status: She is alert and oriented to person, place, and time.     Cranial Nerves: No cranial nerve deficit.  Psychiatric:        Mood and Affect: Mood normal.        Thought Content: Thought  content normal.     Imaging: MR ANGIO HEAD WO CONTRAST Result Date: 09/13/2023 CLINICAL DATA:  Stenosis of artery. EXAM: MRA HEAD WITHOUT CONTRAST TECHNIQUE: Angiographic images of the Circle of Willis were acquired using MRA technique without intravenous contrast. COMPARISON:  MRA November 17, 2022. FINDINGS: Anterior circulation: No substantial change in thread-like antegrade flow signal in the course of the right ICA from the upper neck to the level of the ophthalmic artery. Evidence of some collateral vessels in the region of the ICA terminus. Little to no reconstitution of the right M1 MCA vessel. Similar patent but asymmetrically diminished right distal MCA vessels. The distal right A1 ACA is opacified. Left intracranial ICA demonstrates anterograde flow severe stenosis or occlusion of the supraclinoid ICA which is likely progressed given poor flow related signal in the proximal A1 ACA and proximal left M1 MCA. There is preservation of flow related signal in the more distal A1 ACA and left M1 MCA. More distal MCA vessels remain opacified. More distal ACAs also remain opacified. Posterior circulation: Bilateral intradural vertebral arteries, basilar artery and bilateral posterior cerebral arteries are patent. Potentially severe left P2 PCA stenosis proximally although artifact could be contributing. IMPRESSION: 1. Similar thread-like antegrade flow signal in the course of the right ICA from the upper neck to the level of the ophthalmic artery. Little to no reconstitution of the right M1 MCA vessel. Similar patent but asymmetrically diminished right distal MCA vessels. 2. Severe stenosis or occlusion of  the supraclinoid ICA which is likely progressed given poor flow related signal in the proximal left A1 ACA and proximal left M1 MCA. There is preservation of flow related signal in the more distal A1 ACA and left M1 MCA. More distal left MCA vessels remain opacified. 3. Potentially severe left P2 PCA stenosis  proximally although artifact could be contributing. A CTA could further evaluate if clinically warranted. Electronically Signed   By: Feliberto Harts M.D.   On: 09/13/2023 16:12   OCT, Retina - OU - Both Eyes Result Date: 08/23/2023 Right Eye Quality was good. Central Foveal Thickness: 209. Progression has been stable. Findings include normal foveal contour, no IRF, no SRF, intraretinal hyper-reflective material (Partial PVD with mild stable improvement in vitreous opacities). Left Eye Quality was good. Central Foveal Thickness: 217. Progression has been stable. Findings include normal foveal contour, no IRF, no SRF, vitreomacular adhesion (No DME, Mild diffuse atrophy, stable improvement in pre-retinal hyper reflective material along inferior arcades, stable improvement in vitreous opacities). Notes *Images captured and stored on drive Diagnosis / Impression: No DME OU OD: Partial PVD with mild stable improvement in vitreous opacities OS: No DME, Mild diffuse atrophy, stable improvement in pre-retinal hyper reflective material along inferior arcades, stable improvement in vitreous opacities Clinical management: See below Abbreviations: NFP - Normal foveal profile. CME - cystoid macular edema. PED - pigment epithelial detachment. IRF - intraretinal fluid. SRF - subretinal fluid. EZ - ellipsoid zone. ERM - epiretinal membrane. ORA - outer retinal atrophy. ORT - outer retinal tubulation. SRHM - subretinal hyper-reflective material. IRHM - intraretinal hyper-reflective material    Labs:  CBC: Recent Labs    02/23/23 0633 05/24/23 0620  HGB 12.9 13.6  HCT 38.0 40.0    COAGS: No results for input(s): "INR", "APTT" in the last 8760 hours.  BMP: Recent Labs    02/23/23 0633 05/24/23 0620  NA 141 142  K 4.1 4.4  CL 108 106  GLUCOSE 107* 125*  BUN 21* 23*  CREATININE 1.10* 1.10*    LIVER FUNCTION TESTS: No results for input(s): "BILITOT", "AST", "ALT", "ALKPHOS", "PROT", "ALBUMIN" in the  last 8760 hours.   Assessment and Plan: Cerebrovascular disease Intracranial arterial stenosis, possible aneurysm For cerebral angiogram today. Labs reviewed. Risks and benefits of cerebral angiogram were discussed with the patient including, but not limited to bleeding, infection, vascular injury or contrast induced renal failure.  This interventional procedure involves the use of X-rays and because of the nature of the planned procedure, it is possible that we will have prolonged use of X-ray fluoroscopy.  Potential radiation risks to you include (but are not limited to) the following: - A slightly elevated risk for cancer  several years later in life. This risk is typically less than 0.5% percent. This risk is low in comparison to the normal incidence of human cancer, which is 33% for women and 50% for men according to the [employer] Society. - Radiation induced injury can include skin redness, resembling a rash, tissue breakdown / ulcers and hair loss (which can be temporary or permanent).   The likelihood of either of these occurring depends on the difficulty of the procedure and whether you are sensitive to radiation due to previous procedures, disease, or genetic conditions.   IF your procedure requires a prolonged use of radiation, you will be notified and given written instructions for further action.  It is your responsibility to monitor the irradiated area for the 2 weeks following the procedure and to  notify your physician if you are concerned that you have suffered a radiation induced injury.    All of the patient's questions were answered, patient is agreeable to proceed.  Consent signed and in chart.    Electronically Signed: Brayton El, PA-C 09/18/2023, 7:07 AM   I spent a total of 20 minutes in face to face in clinical consultation, greater than 50% of which was counseling/coordinating care for cerebral angiogram

## 2023-09-24 ENCOUNTER — Telehealth: Payer: Self-pay | Admitting: Orthopedic Surgery

## 2023-09-24 NOTE — Telephone Encounter (Signed)
 Dr. Dallas Schimke pt - pt lvm stating that she just finished PT and they advised her to call and see what the next step is.  Per her AVS from her last visit:  Return if symptoms worsen or fail to improve.  Please advise.

## 2023-09-26 ENCOUNTER — Ambulatory Visit: Admitting: Orthopedic Surgery

## 2023-09-26 NOTE — Progress Notes (Signed)
 [facility] - Clinic Note  10/05/2023   CHIEF COMPLAINT Patient presents for Retina Follow Up  HISTORY OF PRESENT ILLNESS: Jill [name] is a 58 y.o. female who presents to the clinic today for:  HPI     Retina Follow Up   Patient presents with  Diabetic Retinopathy.  In both eyes.  This started 6 weeks ago.  I, the attending physician,  performed the HPI with the patient and updated documentation appropriately.        Comments   Patient here for 6 weeks retina follow up for PDR OU. Patient states vision no problems. No eye pain. Had the flu 1 1/2 weeks ago. Took tamaflu a cough syrup and nasal spray. Better now.       Last edited by Rennis Chris, MD on 10/05/2023  5:52 PM.     Referring physician: Pecolia Ades, MD [address] [place],  [place] [address]  HISTORICAL INFORMATION:  Selected notes from the MEDICAL RECORD NUMBER Referred by Dr. Ilsa Iha for diabetic retinopathy eval -- new subhyaloid heme OD LEE:  Ocular Hx- PMH-   CURRENT MEDICATIONS: No current outpatient medications on file. (Ophthalmic Drugs)   No current facility-administered medications for this visit. (Ophthalmic Drugs)   Current Outpatient Medications (Other)  Medication Sig   acetaminophen (TYLENOL) 500 MG tablet Take 500-1,000 mg by mouth every 8 (eight) hours as needed for moderate pain or headache.    aspirin EC 81 MG tablet Take 81 mg by mouth every morning.   bismuth subsalicylate (PEPTO BISMOL) 262 MG chewable tablet Chew 524 mg by mouth 3 (three) times daily as needed (stomach pain.).   brompheniramine-pseudoephedrine-DM 30-2-10 MG/5ML syrup Take 5 mLs by mouth 4 (four) times daily as needed.   Calcium-Phosphorus-Vitamin D (CITRACAL +D3 PO) Take 2 tablets by mouth in the morning and at bedtime.   clopidogrel (PLAVIX) 75 MG tablet Take 75 mg by mouth in the morning.   Cyanocobalamin (VITAMIN B-12) 5000 MCG SUBL Place 5,000 mcg under the tongue 3 (three) times a  week.   cyclobenzaprine (FLEXERIL) 10 MG tablet Take 1 tablet (10 mg total) by mouth 2 (two) times daily as needed.   docusate sodium (COLACE) 100 MG capsule Take 100 mg by mouth in the morning.   felodipine (PLENDIL) 5 MG 24 hr tablet Take 5 mg by mouth in the morning.   fluticasone (FLONASE) 50 MCG/ACT nasal spray Place 2 sprays into both nostrils daily.   furosemide (LASIX) 20 MG tablet Take 1 tablet (20 mg total) by mouth every other day. (Patient taking differently: Take 20 mg by mouth every other day. Taking PRN for swelling)   gabapentin (NEURONTIN) 300 MG capsule Take 300 mg by mouth 2 (two) times daily.   lamoTRIgine (LAMICTAL) 100 MG tablet Take 100 mg by mouth every morning.    levETIRAcetam (KEPPRA) 500 MG tablet Take 250 mg by mouth 2 (two) times daily.   linaclotide (LINZESS) 145 MCG CAPS capsule Take 145 mcg by mouth daily before breakfast.   LORazepam (ATIVAN) 0.5 MG tablet Take 0.5 mg by mouth daily as needed for anxiety.   metFORMIN (GLUCOPHAGE) 500 MG tablet Take 500 mg by mouth 2 (two) times daily.   metoprolol succinate (TOPROL-XL) 25 MG 24 hr tablet Take 1 tablet by mouth daily   Multiple Vitamins-Minerals (BARIATRIC MULTIVITAMINS/IRON PO) Take 1 tablet by mouth every evening.   olmesartan (BENICAR) 40 MG tablet Take 40 mg by mouth every morning.  OZEMPIC, 1 MG/DOSE, 2 MG/1.5ML SOPN Inject 1 mg into the skin every 30 (thirty) days.   pantoprazole (PROTONIX) 40 MG tablet Take 1 tablet (40 mg total) by mouth 2 (two) times daily.   pioglitazone (ACTOS) 30 MG tablet Take 30 mg by mouth every morning.   potassium chloride (KLOR-CON M) 10 MEQ tablet TAKE 2 TABLETS BY MOUTH DAILY.   rosuvastatin (CRESTOR) 40 MG tablet Take 1 tablet (40 mg total) by mouth at bedtime.   ezetimibe (ZETIA) 10 MG tablet Take 1 tablet (10 mg total) by mouth daily.   oseltamivir (TAMIFLU) 75 MG capsule Take 1 capsule (75 mg total) by mouth every 12 (twelve) hours. (Patient not taking: Reported on  10/05/2023)   predniSONE (STERAPRED UNI-PAK 21 TAB) 10 MG (21) TBPK tablet 10 mg DS 12 as directed (Patient not taking: Reported on 10/05/2023)   No current facility-administered medications for this visit. (Other)   REVIEW OF SYSTEMS: ROS   Positive for: Gastrointestinal, Neurological, Endocrine, Cardiovascular, Eyes Negative for: Constitutional, Skin, Genitourinary, Musculoskeletal, HENT, Respiratory, Psychiatric, Allergic/Imm, Heme/Lymph Last edited by Laddie Aquas, COA on 10/05/2023  9:41 AM.     ALLERGIES Allergies  Allergen Reactions   Dilaudid [Hydromorphone Hcl] Nausea And Vomiting   Crab Extract Swelling    Crab Cakes   Crab [Shellfish Allergy] Swelling   Hydrocodone     "Don't like the way it feels"   Other Other (See Comments)    "pt is a difficult IV stick, prefers IV team paged for IV starts"   PAST MEDICAL HISTORY Past Medical History:  Diagnosis Date   Anemia    Anxiety    Back pain    complains when laying on hard flat surface   Chronic kidney disease    appt. /w urology 11/10/2014- for a cyst seen on [place]. evaluated was told it was nothing.   Diabetes mellitus    Diabetes II, oral and GLP-1.   DVT (deep venous thrombosis) (HCC)    right leg '09 or '10   Fibroids    GERD (gastroesophageal reflux disease)    Heart murmur    History of kidney stones    History of stress test    referred for cardiac cath- done here at [facility]- 09/2014   Hypercholesterolemia    Hypertension    Neuromuscular disorder (HCC)    neuropathy   PONV (postoperative nausea and vomiting)    Seizures (HCC)    02/01/23- last seizure approximately 2015;   Sleep apnea    using cpap as of 05/23/23   Stroke [facility])    '09-had blockage in brain, too deep to operate"occ left lower extremity fatique, occ. 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   Past Surgical History:  Procedure Laterality Date    APPENDECTOMY     BIOPSY  11/18/2021   Procedure: BIOPSY;  Surgeon: Dolores Frame, MD;  [facility];  Service: Gastroenterology;;  random colon;   BIOPSY  12/27/2021   Procedure: BIOPSY;  Surgeon: Dolores Frame, MD;  [facility]: [facility];  Service: Gastroenterology;;   CARDIAC CATHETERIZATION     CESAREAN SECTION  1986 & 1988   x2   CHOLECYSTECTOMY     open"gallstones"   COLONOSCOPY WITH PROPOFOL N/A 11/18/2021   Procedure: COLONOSCOPY WITH PROPOFOL;  Surgeon: Dolores Frame, MD;  [facility]: [facility];  Service: Gastroenterology;  Laterality: N/A;  1215   DILATATION & CURETTAGE/HYSTEROSCOPY WITH MYOSURE N/A 05/10/2017   Procedure: DILATATION & CURETTAGE/HYSTEROSCOPY WITH MYOSURE;  Surgeon: Maxie Better, MD;  Location: [facility];  Service: Gynecology;  Laterality: N/A;   ESOPHAGOGASTRODUODENOSCOPY (EGD) WITH PROPOFOL N/A 12/27/2021   Procedure: ESOPHAGOGASTRODUODENOSCOPY (EGD) WITH PROPOFOL;  Surgeon: Dolores Frame, MD;  [facility];  Service: Gastroenterology;  Laterality: N/A;  100   ESOPHAGOGASTRODUODENOSCOPY (EGD) WITH PROPOFOL N/A 01/13/2022   Procedure: ESOPHAGOGASTRODUODENOSCOPY (EGD) WITH PROPOFOL;  Surgeon: Dolores Frame, MD;  [facility];  Service: Gastroenterology;  Laterality: N/A;  235, per Soledad Gerlach pt knows to arrive at 6:15   HEMOSTASIS CLIP PLACEMENT  01/13/2022   Procedure: HEMOSTASIS CLIP PLACEMENT;  Surgeon: Dolores Frame, MD;  [facility];  Service: Gastroenterology;;   INCISION AND DRAINAGE ABSCESS  02/23/2023   Procedure: INCISION AND DRAINAGE OF PERI RECTAL ABSCESS;  Surgeon: Andria Meuse, MD;  Location: [facility] [place];  Service: General;;   INTERVENTIONAL RADIOLOGY PROCEDURE     CT angiography /neck -Dr. Corliss Skains.   IR ANGIO INTRA EXTRACRAN SEL COM CAROTID INNOMINATE BILAT MOD SED  02/22/2018   IR ANGIO INTRA EXTRACRAN SEL COM  CAROTID INNOMINATE BILAT MOD SED  03/08/2022   IR ANGIO INTRA EXTRACRAN SEL COM CAROTID INNOMINATE BILAT MOD SED  09/18/2023   IR ANGIO VERTEBRAL SEL SUBCLAVIAN INNOMINATE UNI L MOD SED  09/18/2023   IR ANGIO VERTEBRAL SEL VERTEBRAL BILAT MOD SED  02/22/2018   IR ANGIO VERTEBRAL SEL VERTEBRAL UNI R MOD SED  03/08/2022   IR ANGIO VERTEBRAL SEL VERTEBRAL UNI R MOD SED  09/18/2023   IR GENERIC HISTORICAL  08/03/2016   IR RADIOLOGIST EVAL & MGMT 08/03/2016 [facility]   IR RADIOLOGIST EVAL & MGMT  01/03/2022   LAPAROSCOPIC GASTRIC SLEEVE RESECTION N/A 04/10/2016   Procedure: LAPAROSCOPIC GASTRIC SLEEVE RESECTION WITH UPPER ENDO;  Surgeon: Gaynelle Adu, MD;  Location: [facility];  Service: General;  Laterality: N/A;   LEFT HEART CATHETERIZATION WITH CORONARY ANGIOGRAM N/A 09/28/2014   Procedure: LEFT HEART CATHETERIZATION WITH CORONARY ANGIOGRAM;  Surgeon: Corky Crafts, MD;  Location: [facility] CATH LAB;  Service: Cardiovascular;  Laterality: N/A;   LIGATION OF INTERNAL FISTULA TRACT N/A 05/24/2023   Procedure: LIGATION OF INTERSPHINCTERIC FISTULOUS TRACT;  Surgeon: Andria Meuse, MD;  Location: [facility];  Service: General;  Laterality: N/A;  90   NEPHROLITHOTOMY Right 02/01/2022   Procedure: RIGHT NEPHROLITHOTOMY PERCUTANEOUS WITH SURGEON ACCESS;  Surgeon: Crist Fat, MD;  Location: [facility];  Service: Urology;  Laterality: Right;   NEPHROLITHOTOMY Left 02/15/2022   Procedure: LEFT NEPHROLITHOTOMY PERCUTANEOUS WITH SURGEON ACCESS, REMOVAL OF RIGHT URETERAL STENT;  Surgeon: Crist Fat, MD;  Location: [facility];  Service: Urology;  Laterality: Left;  195 MINUTES   PLACEMENT OF SETON N/A 02/23/2023   Procedure: SURGICAL TREATMENT OF ANAL FISTULA-TRANSSPHINTERIL WITH PLACEMENT OF SETON;  Surgeon: Andria Meuse, MD;  Location: [facility];  Service: General;  Laterality: N/A;   POLYPECTOMY  01/13/2022   Procedure: POLYPECTOMY;  Surgeon: Dolores Frame,  MD;  [facility];  Service: Gastroenterology;;   RADIOLOGY WITH ANESTHESIA N/A 10/14/2014   Procedure: Mingo Amber;  Surgeon: Julieanne Cotton, MD;  Location: [facility];  Service: Radiology;  Laterality: N/A;   RECTAL EXAM UNDER ANESTHESIA N/A 05/24/2023   Procedure: ANORECTAL EXAM UNDER ANESTHESIA;  Surgeon: Andria Meuse, MD;  Location: [facility];  Service: General;  Laterality: N/A;   TUBAL LIGATION     FAMILY HISTORY Family History  Problem Relation Age of Onset   Cataracts Mother    Kidney disease Mother        dialysis for 26 years   Heart attack Mother        97's   Diabetes Father    Cataracts Father    Heart failure Father    Heart disease Father    Heart attack Father        71's   Stroke Paternal Grandmother    SOCIAL HISTORY Social History   Tobacco Use   Smoking status: Every Day    Current packs/day: 0.25    Average packs/day: 0.3 packs/day for 48.2 years (12.0 ttl pk-yrs)    Types: Cigarettes    Start date: 07/25/1975    Passive exposure: Current   Smokeless tobacco: Never  Vaping Use   Vaping status: Never Used  Substance Use Topics   Alcohol use: Never   Drug use: No       OPHTHALMIC EXAM:  Base Eye Exam     Visual Acuity (Snellen - Linear)       Right Left   Dist cc 20/25 20/50   Dist ph cc 20/20 -1 20/40    Correction: Glasses         Tonometry (Tonopen, 9:38 AM)       Right Left   Pressure 14 12         Pupils       Dark Light Shape React APD   Right 2 1 Round Brisk None   Left 2 1 Round Brisk None         Visual Fields (Counting fingers)       Left Right    Full Full         Extraocular Movement       Right Left    Full, Ortho Full, Ortho         Neuro/Psych     Oriented x3: Yes   Mood/Affect: Normal         Dilation     Both eyes: 1.0% Mydriacyl, 2.5% Phenylephrine @ 9:38 AM           Slit Lamp and Fundus Exam     Slit Lamp Exam       Right Left    Lids/Lashes Dermatochalasis - upper lid Dermatochalasis - upper lid   Conjunctiva/Sclera mild melanosis mild melanosis   Cornea tear film debris tear film debris   Anterior Chamber deep and clear deep and clear   Iris Round and dilated, No NVI Round and dilated, No NVI   Lens 2+ Nuclear sclerosis, 2-3+ Cortical cataract 2+ Nuclear sclerosis, 2-3+ Cortical cataract, 2+ Posterior subcapsular cataract   Anterior Vitreous Vitreous syneresis, stable improvement in vitreous and subhyaloid heme, white vitreous condensations inferiorly Vitreous syneresis         Fundus Exam       Right Left   Disc Pink and Sharp, no NVD, +fibrosis Pink and Sharp, +cupping   C/D Ratio 0.5 0.65   Macula Flat, Good foveal reflex, RPE mottling and clumping, No heme or edema Flat, Good foveal reflex, RPE mottling and clumping, scattered MA/DBH -- improved, no frank edema   Vessels attenuated, Tortuous, scattered NVE IN midzone attenuated, tortuous, early midzonal NVE   Periphery Attached, boat shaped subhyaloid heme inferior midzone increased and more diffuse, scattered NVE IN midzone Attached, scattered MA, white now boat  shaped subhayloid / pre retinal heme inferior midzone -- improved, good PRP changes nasal hemisphere           Refraction     Wearing Rx       Sphere Cylinder Axis   Right -2.00 +0.50 178   Left -2.00 +0.75 014           IMAGING AND PROCEDURES  Imaging and Procedures for 10/05/2023  OCT, Retina - OU - Both Eyes       Right Eye Quality was good. Central Foveal Thickness: 209. Progression has been stable. Findings include normal foveal contour, no IRF, no SRF, intraretinal hyper-reflective material (Trace cystic changes ST mac, Partial PVD with mild stable improvement in vitreous opacities).   Left Eye Quality was good. Central Foveal Thickness: 215. Progression has been stable. Findings include normal foveal contour, no IRF, no SRF, vitreomacular adhesion (No DME, Mild diffuse  atrophy, stable improvement in pre-retinal hyper reflective material along inferior arcades, stable improvement in vitreous opacities).   Notes *Images captured and stored on drive  Diagnosis / Impression:  No DME OU OD: Trace cystic changes ST mac, Partial PVD with mild stable improvement in vitreous opacities OS: No DME, Mild diffuse atrophy, stable improvement in pre-retinal hyper reflective material along inferior arcades, stable improvement in vitreous opacities  Clinical management:  See below  Abbreviations: NFP - Normal foveal profile. CME - cystoid macular edema. PED - pigment epithelial detachment. IRF - intraretinal fluid. SRF - subretinal fluid. EZ - ellipsoid zone. ERM - epiretinal membrane. ORA - outer retinal atrophy. ORT - outer retinal tubulation. SRHM - subretinal hyper-reflective material. IRHM - intraretinal hyper-reflective material      Fluorescein Angiography Optos (Transit OD)       Right Eye Progression has worsened. Early phase findings include delayed filling, microaneurysm, retinal neovascularization, vascular perfusion defect (Delayed filling, focal NVE and vascular non-perfusion IN midzone -- improved). Mid/Late phase findings include microaneurysm, retinal neovascularization, vascular perfusion defect (vascular non-perfusion with interval re-development of NVE IN midzone).   Left Eye Progression has worsened. Early phase findings include blockage, staining, microaneurysm (Interval improvement in NV-- no NVE). Mid/Late phase findings include blockage, staining, microaneurysm, vascular perfusion defect (punctate NVE greatest inferior midzone -- slightly worse, boat shaped heme inferior to disc -- resolved).   Notes **Images stored on drive**  Impression: PDR OU (OD>OS) OD: vascular non-perfusion with interval re-development of NVE IN midzone OS: punctate NVE greatest inferior midzone -- slightly worse, boat shaped heme inferior to disc -- resolved       Intravitreal Injection, Pharmacologic Agent - OD - Right Eye       Time Out 10/05/2023. 11:28 AM. Confirmed correct patient, procedure, site, and patient consented.   Anesthesia Topical anesthesia was used. Anesthetic medications included Lidocaine 2%, Proparacaine 0.5%.   Procedure Preparation included 5% betadine to ocular surface, eyelid speculum. A supplied needle was used.   Injection: 1.25 mg Bevacizumab 1.25mg /0.58ml   Route: Intravitreal, Site: Right Eye   NDC: [id number], Lot: [id number] , Expiration date: 10/29/2023   Post-op Post injection exam found visual acuity of at least counting fingers. The patient tolerated the procedure well. There were no complications. The patient received written and verbal post procedure care education.           ASSESSMENT/PLAN:   ICD-10-CM   1. 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 Subhyaloid hemorrhage of both eyes  H35.63     3. Long term (current) use of oral hypoglycemic drugs  Z79.84     4. Long-term (current) use of injectable non-insulin antidiabetic drugs  Z79.85     5. Essential hypertension  I10     6. Hypertensive retinopathy of both eyes  H35.033     7. Combined forms of age-related cataract of both eyes  H25.813      1-4. Proliferative diabetic retinopathy w/o DME, OU - s/p IVA OD #1 (05.23.24), #2 (06.24.24), #3 (06.24.24), #4 (08.20.24), #5 (09.17.24), #6 (10.15.24),  - s/p IVA OS #1 (06.24.24), #2 (06.24.24), #3 (08.20.24), #4 (09.17.24), #5 (10.15.24), #6 (12.03.24) - s/p PRP OS (06.03.24) -- incomplete / aborted due to history of photogenic seizures  - mild VH improved OU - FA (05.23.24) shows OD: Boat shaped blockage inferior midzone from  subhyaloid heme, focal NVE and vascular non-perfusion IN midzone; OS: Scattered punctate NVE greatest inferior midzone -- pt needs PRP OU - repeat FA (07.23.24): OD: low fluorescein signal, focal NVE and vascular non-perfusion IN midzone -- improved; OS: Low fluorescein signal, punctate NVE greatest inferior midzone -- improved, new boat shaped heme inferior to disc - repeat FA (10.15.24) OD: focal NVE and vascular non-perfusion IN midzone -- improved w/ regression of NV, no leakage; OS: punctate NVE greatest inferior midzone -- resolved, boat shaped heme inferior to disc- resolved - repeat FA today shows OD: vascular non-perfusion with interval re-development of NVE IN midzone; OS: punctate NVE greatest inferior midzone -- slightly worse, boat shaped heme inferior to disc -- resolved **recurrent NVE with stoppage of anti-VEGF therapy -- may benefit from PRP to more permanently treat NVE** - due to history of photogenic seizures, PRP would need to happen under general anesthesia - will plan for PRP OU under GA on 04.24.25 - recommend IVA OD #7 today, 03.14.25 to treat NVE in the meantime - pt in agreement - RBA of procedure discussed, questions answered - informed consent obtained and signed 05.23.24 (OU) - see procedure note - will plan for PRP OU in the OR on April 24th - f/u 4 weeks -- DFE/OCT, surgical planning and paperwork  5,6. Hypertensive retinopathy OU - discussed importance of tight BP control - monitor  7. Mixed Cataract OU - The symptoms of cataract, surgical options, and treatments and risks were discussed with patient. - discussed diagnosis and progression - monitor  8. Hx of photogenic seizures  Ophthalmic Meds Ordered this visit:  Meds ordered this encounter  Medications   Bevacizumab (AVASTIN) SOLN 1.25 mg     Return in about 4 weeks (around 11/02/2023) for f/u PDR OU, DFE, OCT.  There are no Patient Instructions on file for this visit.  This document serves as a  record of services personally performed by Karie Chimera, MD, PhD. It was created on their behalf by Berlin Hun COT, an ophthalmic technician. The creation of this record is the provider's dictation and/or activities during the visit.    Electronically signed by: Berlin Hun COT 03.05.25 5:58 PM  This document serves as a record of services personally performed by Karie Chimera, MD, PhD. It was created on their behalf by Glee Arvin. Manson Passey, OA an ophthalmic technician. The creation of this record is the provider's dictation and/or activities during the visit.  Electronically signed by: Glee Arvin. Manson Passey, OA 10/05/23 5:58 PM  Karie Chimera, M.D., Ph.D. Diseases & Surgery of the Retina and Vitreous [facility] 10/05/2023   I have reviewed the above documentation for accuracy and completeness, and I agree with the above. Karie Chimera, M.D., Ph.D. 10/05/23 5:59 PM   Abbreviations: M myopia (nearsighted); A astigmatism; H hyperopia (farsighted); P presbyopia; Mrx spectacle prescription;  CTL contact lenses; OD right eye; OS left eye; OU both eyes  XT exotropia; ET esotropia; PEK punctate epithelial keratitis; PEE punctate epithelial erosions; DES dry eye syndrome; MGD meibomian gland dysfunction; ATs artificial tears; PFAT's preservative free artificial tears; NSC nuclear sclerotic cataract; PSC posterior subcapsular cataract; ERM epi-retinal membrane; PVD posterior vitreous detachment; RD retinal detachment; DM diabetes mellitus; DR diabetic retinopathy; NPDR non-proliferative diabetic retinopathy; PDR proliferative diabetic retinopathy; CSME clinically significant macular edema; DME diabetic macular edema; dbh dot blot hemorrhages; CWS cotton wool spot; POAG primary open angle glaucoma; C/D cup-to-disc ratio; HVF humphrey visual field; GVF goldmann visual field; OCT optical coherence tomography; IOP intraocular pressure; BRVO Branch retinal vein occlusion;  CRVO central retinal vein occlusion; CRAO central retinal artery occlusion; BRAO branch retinal artery occlusion; RT retinal tear; SB scleral buckle; PPV pars plana vitrectomy; VH Vitreous hemorrhage; PRP panretinal laser photocoagulation; IVK intravitreal kenalog; VMT vitreomacular traction; MH Macular hole;  NVD neovascularization of the disc; NVE neovascularization elsewhere; AREDS age related eye disease study; ARMD age related macular degeneration; POAG primary open angle glaucoma; EBMD epithelial/anterior basement membrane dystrophy; ACIOL anterior chamber intraocular lens; IOL intraocular lens; PCIOL posterior chamber intraocular lens; Phaco/IOL phacoemulsification with intraocular lens placement; PRK photorefractive keratectomy; LASIK laser assisted in situ keratomileusis; HTN hypertension; DM diabetes mellitus; COPD chronic obstructive pulmonary disease

## 2023-09-27 ENCOUNTER — Ambulatory Visit
Admission: RE | Admit: 2023-09-27 | Discharge: 2023-09-27 | Disposition: A | Discharge status: Home / Self Care | Source: Ambulatory Visit | Attending: Nurse Practitioner | Admitting: Nurse Practitioner

## 2023-09-27 VITALS — BP 124/73 | HR 76 | Temp 98.6°F | Resp 20

## 2023-09-27 DIAGNOSIS — Z20828 Contact with and (suspected) exposure to other viral communicable diseases: Secondary | ICD-10-CM

## 2023-09-27 DIAGNOSIS — J111 Influenza due to unidentified influenza virus with other respiratory manifestations: Secondary | ICD-10-CM

## 2023-09-27 MED ORDER — OSELTAMIVIR PHOSPHATE 75 MG PO CAPS: 75.0000 mg | ORAL_CAPSULE | Freq: Two times a day (BID) | ORAL | 0 refills | Status: DC

## 2023-09-27 MED ORDER — FLUTICASONE PROPIONATE 50 MCG/ACT NA SUSP: 2.0000 | Freq: Every day | NASAL | 0 refills | Status: DC

## 2023-09-27 MED ORDER — PSEUDOEPH-BROMPHEN-DM 30-2-10 MG/5ML PO SYRP: 5.0000 mL | ORAL_SOLUTION | Freq: Four times a day (QID) | ORAL | 0 refills | Status: AC | PRN

## 2023-09-27 NOTE — Discharge Instructions (Signed)
 Your COVID/flu test was negative.  I am treating you with Tamiflu given your close exposure. Over-the-counter Tylenol or ibuprofen as needed for pain, fever, or general discomfort. Recommend the use of Pedialyte or Gatorade to prevent dehydration. Recommend using a humidifier in the bedroom at nighttime during sleep and having her sleep elevated on pillows while cough symptoms persist. You should remain home until you have been fever free for 24 hours with no medication. Please be advised that symptoms should improve over the next 5 to 7 days.  If symptoms appear to be worsening, or if there are other concerns, you may follow-up in this clinic or with your primary care physician for further evaluation. Follow-up as needed.

## 2023-09-27 NOTE — ED Provider Notes (Signed)
 [facility]    CSN: [id number] Arrival date & time: 09/27/23  1458      History   Chief Complaint No chief complaint on file.   HPI Jill [name] is a 58 y.o. female.   The history is provided by the patient.   Patient presents with a 2-day history of subjective fever, chills, nasal congestion, runny nose, fatigue, and cough.  Patient denies headache, ear pain, wheezing, difficulty breathing, chest pain, abdominal pain, nausea, vomiting, diarrhea, or rash.  Patient reports she has been taking over-the-counter cough and cold medications with minimal relief.  States that she has been around her granddaughter who currently has the flu.  Past Medical History:  Diagnosis Date   Anemia    Anxiety    Back pain    complains when laying on hard flat surface   Chronic kidney disease    appt. /w urology 11/10/2014- for a cyst seen on [place]. evaluated was told it was nothing.   Diabetes mellitus    Diabetes II, oral and GLP-1.   DVT (deep venous thrombosis) (HCC)    right leg '09 or '10   Fibroids    GERD (gastroesophageal reflux disease)    Heart murmur    History of kidney stones    History of stress test    referred for cardiac cath- done here at [facility]- 09/2014   Hypercholesterolemia    Hypertension    Neuromuscular disorder (HCC)    neuropathy   PONV (postoperative nausea and vomiting)    Seizures (HCC)    02/01/23- last seizure approximately 2015;   Sleep apnea    using cpap as of 05/23/23   Stroke [facility])    '09-had blockage in brain, too deep to operate"occ left lower extremity fatique, occ. 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    Patient Active Problem List   Diagnosis Date Noted   Screening for malignant neoplasm of colon 07/11/2023   Perianal abscess 10/24/2022   Nephrolithiasis 02/01/2022   Gastritis 01/12/2022   Abdominal pain 10/13/2021   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    Constipation 10/13/2021   Morbid obesity with BMI of 50.0-59.9, adult (HCC) 04/10/2016   GERD (gastroesophageal reflux disease) 04/10/2016   History of CVA (cerebrovascular accident) without residual deficits 04/10/2016   Type 2 diabetes mellitus with obesity (HCC) 04/10/2016   OSA on CPAP 04/10/2016   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   Carotid stenosis, symptomatic w/o infarct 10/14/2014   Abnormal stress test    Stenosis of artery (HCC)    Morbid obesity due to excess calories (HCC) 08/24/2014   Tobacco use 08/24/2014   CVA (cerebral vascular accident) (HCC) 06/12/2013   HTN (hypertension) 06/12/2013   Hypertriglyceridemia 06/12/2013   PFO (patent foramen ovale) 06/12/2013    Past Surgical History:  Procedure Laterality Date   APPENDECTOMY     BIOPSY  11/18/2021   Procedure: BIOPSY;  Surgeon: Dolores Frame, MD;  [facility]: [facility];  Service: Gastroenterology;;  random colon;   BIOPSY  12/27/2021   Procedure: BIOPSY;  Surgeon: Dolores Frame, MD;  [facility]: [facility];  Service: Gastroenterology;;   CARDIAC CATHETERIZATION     CESAREAN SECTION  1986 & 1988   x2   CHOLECYSTECTOMY     open"gallstones"   COLONOSCOPY WITH PROPOFOL N/A 11/18/2021   Procedure: COLONOSCOPY WITH PROPOFOL;  Surgeon: Dolores Frame, MD;  [facility]:  [facility];  Service: Gastroenterology;  Laterality: N/A;  1215   DILATATION & CURETTAGE/HYSTEROSCOPY WITH MYOSURE N/A 05/10/2017   Procedure: DILATATION & CURETTAGE/HYSTEROSCOPY WITH MYOSURE;  Surgeon: Maxie Better, MD;  Location: [facility];  Service: Gynecology;  Laterality: N/A;   ESOPHAGOGASTRODUODENOSCOPY (EGD) WITH PROPOFOL N/A 12/27/2021   Procedure: ESOPHAGOGASTRODUODENOSCOPY (EGD) WITH PROPOFOL;  Surgeon: Dolores Frame, MD;  [facility]: [facility];  Service: Gastroenterology;  Laterality: N/A;  100   ESOPHAGOGASTRODUODENOSCOPY (EGD) WITH PROPOFOL N/A 01/13/2022   Procedure:  ESOPHAGOGASTRODUODENOSCOPY (EGD) WITH PROPOFOL;  Surgeon: Dolores Frame, MD;  [facility];  Service: Gastroenterology;  Laterality: N/A;  235, per Soledad Gerlach pt knows to arrive at 6:15   HEMOSTASIS CLIP PLACEMENT  01/13/2022   Procedure: HEMOSTASIS CLIP PLACEMENT;  Surgeon: Dolores Frame, MD;  [facility];  Service: Gastroenterology;;   INCISION AND DRAINAGE ABSCESS  02/23/2023   Procedure: INCISION AND DRAINAGE OF PERI RECTAL ABSCESS;  Surgeon: Andria Meuse, MD;  Location: [facility];  Service: General;;   INTERVENTIONAL RADIOLOGY PROCEDURE     CT angiography /neck -Dr. Corliss Skains.   IR ANGIO INTRA EXTRACRAN SEL COM CAROTID INNOMINATE BILAT MOD SED  02/22/2018   IR ANGIO INTRA EXTRACRAN SEL COM CAROTID INNOMINATE BILAT MOD SED  03/08/2022   IR ANGIO INTRA EXTRACRAN SEL COM CAROTID INNOMINATE BILAT MOD SED  09/18/2023   IR ANGIO VERTEBRAL SEL SUBCLAVIAN INNOMINATE UNI L MOD SED  09/18/2023   IR ANGIO VERTEBRAL SEL VERTEBRAL BILAT MOD SED  02/22/2018   IR ANGIO VERTEBRAL SEL VERTEBRAL UNI R MOD SED  03/08/2022   IR ANGIO VERTEBRAL SEL VERTEBRAL UNI R MOD SED  09/18/2023   IR GENERIC HISTORICAL  08/03/2016   IR RADIOLOGIST EVAL & MGMT 08/03/2016 [facility]   IR RADIOLOGIST EVAL & MGMT  01/03/2022   LAPAROSCOPIC GASTRIC SLEEVE RESECTION N/A 04/10/2016   Procedure: LAPAROSCOPIC GASTRIC SLEEVE RESECTION WITH UPPER ENDO;  Surgeon: Gaynelle Adu, MD;  Location: [facility];  Service: General;  Laterality: N/A;   LEFT HEART CATHETERIZATION WITH CORONARY ANGIOGRAM N/A 09/28/2014   Procedure: LEFT HEART CATHETERIZATION WITH CORONARY ANGIOGRAM;  Surgeon: Corky Crafts, MD;  Location: [facility];  Service: Cardiovascular;  Laterality: N/A;   LIGATION OF INTERNAL FISTULA TRACT N/A 05/24/2023   Procedure: LIGATION OF INTERSPHINCTERIC FISTULOUS TRACT;  Surgeon: Andria Meuse, MD;  Location: [facility];  Service: General;   Laterality: N/A;  90   NEPHROLITHOTOMY Right 02/01/2022   Procedure: RIGHT NEPHROLITHOTOMY PERCUTANEOUS WITH SURGEON ACCESS;  Surgeon: Crist Fat, MD;  Location: [facility];  Service: Urology;  Laterality: Right;   NEPHROLITHOTOMY Left 02/15/2022   Procedure: LEFT NEPHROLITHOTOMY PERCUTANEOUS WITH SURGEON ACCESS, REMOVAL OF RIGHT URETERAL STENT;  Surgeon: Crist Fat, MD;  Location: [facility];  Service: Urology;  Laterality: Left;  195 MINUTES   PLACEMENT OF SETON N/A 02/23/2023   Procedure: SURGICAL TREATMENT OF ANAL FISTULA-TRANSSPHINTERIL WITH PLACEMENT OF SETON;  Surgeon: Andria Meuse, MD;  Location: [facility];  Service: General;  Laterality: N/A;   POLYPECTOMY  01/13/2022   Procedure: POLYPECTOMY;  Surgeon: Dolores Frame, MD;  [facility];  Service: Gastroenterology;;   RADIOLOGY WITH ANESTHESIA N/A 10/14/2014   Procedure: [place] Amber;  Surgeon: Julieanne Cotton, MD;  Location: [facility];  Service: Radiology;  Laterality: N/A;   RECTAL EXAM UNDER ANESTHESIA N/A 05/24/2023   Procedure: ANORECTAL EXAM UNDER ANESTHESIA;  Surgeon: Andria Meuse, MD;  Location: [facility];  Service: General;  Laterality: N/A;   TUBAL LIGATION      OB History   No obstetric history on file.      Home Medications    Prior to Admission medications   Medication Sig Start Date End Date Taking? Authorizing Provider  brompheniramine-pseudoephedrine-DM 30-2-10 MG/5ML syrup Take 5 mLs by mouth 4 (four) times daily as needed. 09/27/23  Yes Leath-Warren, Sadie Haber, NP  fluticasone (FLONASE) 50 MCG/ACT nasal spray Place 2 sprays into both nostrils daily. 09/27/23  Yes Leath-Warren, Sadie Haber, NP  oseltamivir (TAMIFLU) 75 MG capsule Take 1 capsule (75 mg total) by mouth every 12 (twelve) hours. 09/27/23  Yes Leath-Warren, Sadie Haber, NP  acetaminophen (TYLENOL) 500 MG tablet Take 500-1,000 mg by mouth every 8 (eight) hours as needed for moderate  pain or headache.     [provider]  aspirin EC 81 MG tablet Take 81 mg by mouth every morning.    [provider]  bismuth subsalicylate (PEPTO BISMOL) 262 MG chewable tablet Chew 524 mg by mouth 3 (three) times daily as needed (stomach pain.).    [provider]  Calcium-Phosphorus-Vitamin D (CITRACAL +D3 PO) Take 2 tablets by mouth in the morning and at bedtime.    [provider]  clopidogrel (PLAVIX) 75 MG tablet Take 75 mg by mouth in the morning.    [provider]  Cyanocobalamin (VITAMIN B-12) 5000 MCG SUBL Place 5,000 mcg under the tongue 3 (three) times a week.    [provider]  cyclobenzaprine (FLEXERIL) 10 MG tablet Take 1 tablet (10 mg total) by mouth 2 (two) times daily as needed. 07/11/23   Oliver Barre, MD  docusate sodium (COLACE) 100 MG capsule Take 100 mg by mouth in the morning.    [provider]  ezetimibe (ZETIA) 10 MG tablet Take 1 tablet (10 mg total) by mouth daily. 04/24/23 09/18/23  Antoine Poche, MD  felodipine (PLENDIL) 5 MG 24 hr tablet Take 5 mg by mouth in the morning.    [provider]  furosemide (LASIX) 20 MG tablet Take 1 tablet (20 mg total) by mouth every other day. Patient taking differently: Take 20 mg by mouth every other day. Taking PRN for swelling 04/24/23 04/18/24  Antoine Poche, MD  gabapentin (NEURONTIN) 300 MG capsule Take 300 mg by mouth 2 (two) times daily. 01/07/16   [provider]  lamoTRIgine (LAMICTAL) 100 MG tablet Take 100 mg by mouth every morning.     [provider]  levETIRAcetam (KEPPRA) 500 MG tablet Take 250 mg by mouth 2 (two) times daily.    [provider]  linaclotide (LINZESS) 145 MCG CAPS capsule Take 145 mcg by mouth daily before breakfast.    [provider]  LORazepam (ATIVAN) 0.5 MG tablet Take 0.5 mg by mouth daily as needed for anxiety.    [provider]  metFORMIN (GLUCOPHAGE) 500 MG tablet  Take 500 mg by mouth 2 (two) times daily. 09/27/22   [provider]  metoprolol succinate (TOPROL-XL) 25 MG 24 hr tablet Take 1 tablet by mouth daily 02/18/20   Dyann Kief, PA-C  Multiple Vitamins-Minerals (BARIATRIC MULTIVITAMINS/IRON PO) Take 1 tablet by mouth every evening.    [provider]  olmesartan (BENICAR) 40 MG tablet Take 40 mg by mouth every morning.     [provider]  OZEMPIC, 1 MG/DOSE, 2 MG/1.5ML SOPN Inject 1 mg into the skin every  30 (thirty) days. 12/18/19   [provider]  pantoprazole (PROTONIX) 40 MG tablet Take 1 tablet (40 mg total) by mouth 2 (two) times daily. 01/16/22   Dolores Frame, MD  pioglitazone (ACTOS) 30 MG tablet Take 30 mg by mouth every morning.    [provider]  potassium chloride (KLOR-CON M) 10 MEQ tablet TAKE 2 TABLETS BY MOUTH DAILY. 01/12/23   Antoine Poche, MD  predniSONE (STERAPRED UNI-PAK 21 TAB) 10 MG (21) TBPK tablet 10 mg DS 12 as directed Patient not taking: Reported on 08/15/2023 07/11/23   Oliver Barre, MD  rosuvastatin (CRESTOR) 40 MG tablet Take 1 tablet (40 mg total) by mouth at bedtime. 12/15/19   Dyann Kief, PA-C    Family History Family History  Problem Relation Age of Onset   Cataracts Mother    Kidney disease Mother        dialysis for 26 years   Heart attack Mother        31's   Diabetes Father    Cataracts Father    Heart failure Father    Heart disease Father    Heart attack Father        11's   Stroke Paternal Grandmother     Social History Social History   Tobacco Use   Smoking status: Every Day    Current packs/day: 0.25    Average packs/day: 0.3 packs/day for 48.2 years (12.0 ttl pk-yrs)    Types: Cigarettes    Start date: 07/25/1975    Passive exposure: Current   Smokeless tobacco: Never  Vaping Use   Vaping status: Never Used  Substance Use Topics   Alcohol use: Never   Drug use: No     Allergies   Dilaudid [hydromorphone  hcl], Crab extract, Crab [shellfish allergy], Hydrocodone, and Other   Review of Systems Review of Systems Per HPI  Physical Exam Triage Vital Signs ED Triage Vitals  Encounter Vitals Group     BP 09/27/23 1554 124/73     Systolic BP Percentile --      Diastolic BP Percentile --      Pulse Rate 09/27/23 1554 76     Resp 09/27/23 1554 20     Temp 09/27/23 1554 98.6 F (37 C)     Temp Source 09/27/23 1554 Oral     SpO2 09/27/23 1554 97 %     Weight --      Height --      Head Circumference --      Peak Flow --      Pain Score 09/27/23 1553 0     Pain Loc --      Pain Education --      Exclude from Growth Chart --    No data found.  Updated Vital Signs BP 124/73 (BP Location: Right Arm)   Pulse 76   Temp 98.6 F (37 C) (Oral)   Resp 20   LMP 12/10/2014 Comment: pt states that there is no chance or pregnancy  SpO2 97%   Visual Acuity Right Eye Distance:   Left Eye Distance:   Bilateral Distance:    Right Eye Near:   Left Eye Near:    Bilateral Near:     Physical Exam Vitals and nursing note reviewed.  Constitutional:      General: She is not in acute distress.    Appearance: Normal appearance.  HENT:     Head: Normocephalic.     Right Ear:  Tympanic membrane, ear canal and external ear normal.     Left Ear: Tympanic membrane, ear canal and external ear normal.     Nose: Congestion present.     Right Turbinates: Enlarged and swollen.     Left Turbinates: Enlarged and swollen.     Right Sinus: No maxillary sinus tenderness or frontal sinus tenderness.     Left Sinus: No maxillary sinus tenderness or frontal sinus tenderness.     Mouth/Throat:     Lips: Pink.     Mouth: Mucous membranes are moist.     Pharynx: Uvula midline. Postnasal drip present. No pharyngeal swelling, oropharyngeal exudate, posterior oropharyngeal erythema or uvula swelling.  Eyes:     Extraocular Movements: Extraocular movements intact.     Conjunctiva/sclera: Conjunctivae normal.      Pupils: Pupils are equal, round, and reactive to light.  Cardiovascular:     Rate and Rhythm: Normal rate and regular rhythm.     Pulses: Normal pulses.     Heart sounds: Normal heart sounds.  Pulmonary:     Effort: Pulmonary effort is normal. No respiratory distress.     Breath sounds: Normal breath sounds. No stridor. No wheezing, rhonchi or rales.  Abdominal:     General: Bowel sounds are normal.     Palpations: Abdomen is soft.     Tenderness: There is no abdominal tenderness.  Musculoskeletal:     Cervical back: Normal range of motion.  Lymphadenopathy:     Cervical: No cervical adenopathy.  Skin:    General: Skin is warm and dry.  Neurological:     General: No focal deficit present.     Mental Status: She is alert and oriented to person, place, and time.  Psychiatric:        Mood and Affect: Mood normal.        Behavior: Behavior normal.     UC Treatments / Results  Labs (all labs ordered are listed, but only abnormal results are displayed) Labs Reviewed  POC COVID19/FLU A&B COMBO    EKG   Radiology No results found.  Procedures Procedures (including critical care time)  Medications Ordered in UC Medications - No data to display  Initial Impression / Assessment and Plan / UC Course  I have reviewed the triage vital signs and the nursing notes.  Pertinent labs & imaging results that were available during my care of the patient were reviewed by me and considered in my medical decision making (see chart for details).  COVID/flu test was negative.  Given patient's recent close exposure to influenza and current symptoms, will treat with Tamiflu 75 mg twice daily for the next 5 days.  Symptomatic treatment was also provided with Bromfed-DM for her cough, and fluticasone 50 mcg nasal spray for nasal congestion and runny nose.  Supportive care recommendations were provided and discussed with the patient to include fluids, rest, over-the-counter analgesics, use of a  humidifier during sleep.  Discussed indications regarding follow-up.  Patient was in agreement with this plan of care and verbalizes understanding.  All questions were answered.  Patient stable for discharge.  Final Clinical Impressions(s) / UC Diagnoses   Final diagnoses:  Influenza-like illness  Exposure to influenza     Discharge Instructions      Your COVID/flu test was negative.  I am treating you with Tamiflu given your close exposure. Over-the-counter Tylenol or ibuprofen as needed for pain, fever, or general discomfort. Recommend the use of Pedialyte or Gatorade to prevent dehydration.  Recommend using a humidifier in the bedroom at nighttime during sleep and having her sleep elevated on pillows while cough symptoms persist. You should remain home until you have been fever free for 24 hours with no medication. Please be advised that symptoms should improve over the next 5 to 7 days.  If symptoms appear to be worsening, or if there are other concerns, you may follow-up in this clinic or with your primary care physician for further evaluation. Follow-up as needed.     ED Prescriptions     Medication Sig Dispense Auth. Provider   oseltamivir (TAMIFLU) 75 MG capsule Take 1 capsule (75 mg total) by mouth every 12 (twelve) hours. 10 capsule Leath-Warren, Sadie Haber, NP   brompheniramine-pseudoephedrine-DM 30-2-10 MG/5ML syrup Take 5 mLs by mouth 4 (four) times daily as needed. 140 mL Leath-Warren, Sadie Haber, NP   fluticasone (FLONASE) 50 MCG/ACT nasal spray Place 2 sprays into both nostrils daily. 16 g Leath-Warren, Sadie Haber, NP      PDMP not reviewed this encounter.   Abran Cantor, NP 09/27/23 1621

## 2023-09-27 NOTE — ED Triage Notes (Signed)
 Pt reports she has a bad cough, fever, chills, and fatigue x 2 days  Granddaughter has the flu

## 2023-10-03 ENCOUNTER — Ambulatory Visit: Admitting: Orthopedic Surgery

## 2023-10-05 ENCOUNTER — Encounter (INDEPENDENT_AMBULATORY_CARE_PROVIDER_SITE_OTHER): Payer: Self-pay | Admitting: Ophthalmology

## 2023-10-05 ENCOUNTER — Ambulatory Visit (INDEPENDENT_AMBULATORY_CARE_PROVIDER_SITE_OTHER): Payer: BC Managed Care – PPO | Admitting: Ophthalmology

## 2023-10-05 VITALS — BP 146/82

## 2023-10-05 DIAGNOSIS — H3563 Retinal hemorrhage, bilateral: Secondary | ICD-10-CM

## 2023-10-05 DIAGNOSIS — I10 Essential (primary) hypertension: Secondary | ICD-10-CM

## 2023-10-05 DIAGNOSIS — Z7985 Long-term (current) use of injectable non-insulin antidiabetic drugs: Secondary | ICD-10-CM

## 2023-10-05 DIAGNOSIS — E113593 Type 2 diabetes mellitus with proliferative diabetic retinopathy without macular edema, bilateral: Secondary | ICD-10-CM

## 2023-10-05 DIAGNOSIS — Z7984 Long term (current) use of oral hypoglycemic drugs: Secondary | ICD-10-CM

## 2023-10-05 DIAGNOSIS — H35033 Hypertensive retinopathy, bilateral: Secondary | ICD-10-CM

## 2023-10-05 DIAGNOSIS — H3561 Retinal hemorrhage, right eye: Secondary | ICD-10-CM

## 2023-10-05 DIAGNOSIS — H25813 Combined forms of age-related cataract, bilateral: Secondary | ICD-10-CM

## 2023-10-05 MED ORDER — BEVACIZUMAB CHEMO INJECTION 1.25MG/0.05ML SYRINGE FOR KALEIDOSCOPE
1.2500 mg | INTRAVITREAL | Status: AC | PRN
Start: 2023-10-05 — End: 2023-10-05
  Administered 2023-10-05: 1.25 mg via INTRAVITREAL

## 2023-10-09 ENCOUNTER — Ambulatory Visit: Attending: Cardiology | Admitting: Cardiology

## 2023-10-09 ENCOUNTER — Ambulatory Visit (INDEPENDENT_AMBULATORY_CARE_PROVIDER_SITE_OTHER): Admitting: Orthopedic Surgery

## 2023-10-09 ENCOUNTER — Encounter: Payer: Self-pay | Admitting: Cardiology

## 2023-10-09 VITALS — BP 130/64 | HR 71 | Ht 66.5 in | Wt 225.0 lb

## 2023-10-09 DIAGNOSIS — I2510 Atherosclerotic heart disease of native coronary artery without angina pectoris: Secondary | ICD-10-CM

## 2023-10-09 DIAGNOSIS — M545 Low back pain, unspecified: Secondary | ICD-10-CM

## 2023-10-09 DIAGNOSIS — I5032 Chronic diastolic (congestive) heart failure: Secondary | ICD-10-CM

## 2023-10-09 DIAGNOSIS — R4182 Altered mental status, unspecified: Secondary | ICD-10-CM

## 2023-10-09 MED ORDER — POTASSIUM CHLORIDE CRYS ER 10 MEQ PO TBCR: 20.0000 meq | EXTENDED_RELEASE_TABLET | ORAL | 3 refills | Status: DC

## 2023-10-09 NOTE — Progress Notes (Unsigned)
 Orthopaedic Clinic Return  Assessment: Jill [name] is a 58 y.o. female with the following: Back pain   Plan: [name] continues to have lower back pain.  She has occasional radiating pains into both legs.  She feels the left leg is getting weaker.  She has attended physical therapy, without improvement in her symptoms.  Medications are no longer effective.  She is ambulating with the assistance of a cane.  At this point, I am recommending an MRI of the lumbar spine.  She will return once the results are available.   Follow-up: Return for After MRI.   Subjective:  Chief Complaint  Patient presents with   Back Pain    W/ L leg weakness. Has had 6-7 episodes of pain going to 10/10. Pt hasn't noticed that this pain comes at any particular time and pain usually lasts for up to 30 mins. Pt states they have stopped therapy due to pain, pt states she was making progress w/ strengthening and endurance but pain hasn't subsided.     History of Present Illness: Jill [name] is a 58 y.o. female who returns to clinic for repeat evaluation of low back pain.  She continues to have low back pain.  She has worked with physical therapy.  This is made her pain worse.  Medications are not effective.  She has occasional radiating pains in the left leg.  She feels that the left leg is getting weak.  No numbness or tingling.  Review of Systems: No fevers or chills No numbness or tingling No chest pain No shortness of breath No bowel or bladder dysfunction No GI distress No headaches   Objective: LMP 12/10/2014 Comment: pt states that there is no chance or pregnancy  Physical Exam:  Alert and oriented.  No acute distress.  Ambulating with a cane.  Diffuse tenderness to palpation in the lower back.  Mild weakness is appreciated in the left lower extremity.  2+ patellar tendon reflexes.  Sensation is intact throughout bilateral lower extremities.  IMAGING: I personally ordered and  reviewed the following images:  No new imaging obtained today.   Oliver Barre, MD 10/10/2023 10:13 PM

## 2023-10-09 NOTE — Progress Notes (Signed)
 Clinical Summary Jill [name] is a 58 y.o.female seen today for follo wup of the following medical problems.     1. Orthostatic dizziness/syncope -previously found to be orthostatic in clinic - some episodes of lightheadedness with standing but no recurrent syncope. We had lowered her lasix  to 20mg  daily last visit, improvement but symtoms not resolved.   - last visit lowered lasix  to 20mg  every other day due to ongoing orthostatic dizziness  -episode 3 weeks ago while driving got confused, dizzy, loopy. Trouble keeping car on the road.     2. Obesity - history of bariatric surgery,     3. Abnormal stress test/Chest pain/CAD - as part of a bariatric surgery preop workup she had a nuclear stress that was abnormal - follow up cath 09/2014 showed mild to moderate non-obstructive disease.    -- no recent chest pains.      4. Hyperlipidemia    11/2019 TC 166 TG 178 HDL 49 LDL 81 - more recent labs with pcp - she is on crestor  40mg  daily  Jan 2024 TC 158 TG 124 HDL 47 LDL 89 - request if more recent panel   5. HTN   -she is compliant wth meds   6. PAD - prior right lower intervention with PTA and stenting of right common iliac and right SFA in Jan 2010 -  no recent symptoms.    7. OSA - remains compliant with CPAP - followed by Dr Villa Greaser - 08/2022 sleep study mild OSA     8. Carotid stenosis - followed by vascular. History of left ICA angioplasty 09/2014 for which she remains on ASA and plavix  for.  - from 09/2020 telephone note neuro interventional Dr Alvira Josephs had recommended lifelong DAPT.      9. Chronic diastolic HF - last visit lowered lasix  to 20mg  every other day due to ongoing orthostatic dizziness - no recent edema - wears compression stocking.s   Past Medical History:  Diagnosis Date   Anemia    Anxiety    Back pain    complains when laying on hard flat surface   Chronic kidney disease    appt. /w urology 11/10/2014- for a cyst seen on US . evaluated  was told it was nothing.   Diabetes mellitus    Diabetes II, oral and GLP-1.   DVT (deep venous thrombosis) (HCC)    right leg '09 or '10   Fibroids    GERD (gastroesophageal reflux disease)    Heart murmur    History of kidney stones    History of stress test    referred for cardiac cath- done here at [facility]- 09/2014   Hypercholesterolemia    Hypertension    Neuromuscular disorder (HCC)    neuropathy   PONV (postoperative nausea and vomiting)    Seizures (HCC)    02/01/23- last seizure approximately 2015;   Sleep apnea    using cpap as of 05/23/23   Stroke [facility])    '09-had blockage in brain, too deep to operateocc left lower extremity fatique, occ. strangle, occ left mouth droopmild. may have a tendency to smile or cry with no reason has had 5 strokes last stroke 2014     Allergies  Allergen Reactions   Dilaudid  [Hydromorphone  Hcl] Nausea And Vomiting   Crab Extract Swelling    Crab Cakes   Crab [Shellfish Allergy] Swelling   Hydrocodone      Don't like the way it feels   Other Other (See Comments)  pt is a difficult IV stick, prefers IV team paged for IV starts     Current Outpatient Medications  Medication Sig Dispense Refill   acetaminophen  (TYLENOL ) 500 MG tablet Take 500-1,000 mg by mouth every 8 (eight) hours as needed for moderate pain or headache.      aspirin  EC 81 MG tablet Take 81 mg by mouth every morning.     bismuth subsalicylate (PEPTO BISMOL) 262 MG chewable tablet Chew 524 mg by mouth 3 (three) times daily as needed (stomach pain.).     brompheniramine-pseudoephedrine-DM 30-2-10 MG/5ML syrup Take 5 mLs by mouth 4 (four) times daily as needed. 140 mL 0   Calcium -Phosphorus-Vitamin D (CITRACAL +D3 PO) Take 2 tablets by mouth in the morning and at bedtime.     clopidogrel  (PLAVIX ) 75 MG tablet Take 75 mg by mouth in the morning.     Cyanocobalamin (VITAMIN B-12) 5000 MCG SUBL Place 5,000 mcg under the tongue 3 (three) times a week.      cyclobenzaprine  (FLEXERIL ) 10 MG tablet Take 1 tablet (10 mg total) by mouth 2 (two) times daily as needed. 20 tablet 0   docusate sodium  (COLACE) 100 MG capsule Take 100 mg by mouth in the morning.     ezetimibe  (ZETIA ) 10 MG tablet Take 1 tablet (10 mg total) by mouth daily. 90 tablet 3   felodipine  (PLENDIL ) 5 MG 24 hr tablet Take 5 mg by mouth in the morning.     fluticasone  (FLONASE ) 50 MCG/ACT nasal spray Place 2 sprays into both nostrils daily. 16 g 0   furosemide  (LASIX ) 20 MG tablet Take 1 tablet (20 mg total) by mouth every other day. (Patient taking differently: Take 20 mg by mouth every other day. Taking PRN for swelling) 90 tablet 3   gabapentin  (NEURONTIN ) 300 MG capsule Take 300 mg by mouth 2 (two) times daily.  0   lamoTRIgine  (LAMICTAL ) 100 MG tablet Take 100 mg by mouth every morning.      levETIRAcetam  (KEPPRA ) 500 MG tablet Take 250 mg by mouth 2 (two) times daily.     linaclotide (LINZESS) 145 MCG CAPS capsule Take 145 mcg by mouth daily before breakfast.     LORazepam  (ATIVAN ) 0.5 MG tablet Take 0.5 mg by mouth daily as needed for anxiety.     metFORMIN  (GLUCOPHAGE ) 500 MG tablet Take 500 mg by mouth 2 (two) times daily.     metoprolol  succinate (TOPROL -XL) 25 MG 24 hr tablet Take 1 tablet by mouth daily 90 tablet 2   Multiple Vitamins-Minerals (BARIATRIC MULTIVITAMINS/IRON PO) Take 1 tablet by mouth every evening.     olmesartan (BENICAR) 40 MG tablet Take 40 mg by mouth every morning.      oseltamivir  (TAMIFLU ) 75 MG capsule Take 1 capsule (75 mg total) by mouth every 12 (twelve) hours. (Patient not taking: Reported on 10/05/2023) 10 capsule 0   OZEMPIC, 1 MG/DOSE, 2 MG/1.5ML SOPN Inject 1 mg into the skin every 30 (thirty) days.     pantoprazole  (PROTONIX ) 40 MG tablet Take 1 tablet (40 mg total) by mouth 2 (two) times daily. 60 tablet 2   pioglitazone  (ACTOS ) 30 MG tablet Take 30 mg by mouth every morning.     potassium chloride  (KLOR-CON  M) 10 MEQ tablet TAKE 2 TABLETS BY  MOUTH DAILY. 180 tablet 2   predniSONE  (STERAPRED UNI-PAK 21 TAB) 10 MG (21) TBPK tablet 10 mg DS 12 as directed (Patient not taking: Reported on 10/05/2023) 48 tablet 0   rosuvastatin  (  CRESTOR ) 40 MG tablet Take 1 tablet (40 mg total) by mouth at bedtime. 90 tablet 1   No current facility-administered medications for this visit.     Past Surgical History:  Procedure Laterality Date   APPENDECTOMY     BIOPSY  11/18/2021   Procedure: BIOPSY;  Surgeon: Urban Garden, MD;  [facility];  Service: Gastroenterology;;  random colon;   BIOPSY  12/27/2021   Procedure: BIOPSY;  Surgeon: Urban Garden, MD;  [facility];  Service: Gastroenterology;;   CARDIAC CATHETERIZATION     CESAREAN SECTION  1986 & 1988   x2   CHOLECYSTECTOMY     opengallstones   COLONOSCOPY WITH PROPOFOL  N/A 11/18/2021   Procedure: COLONOSCOPY WITH PROPOFOL ;  Surgeon: Urban Garden, MD;  [facility];  Service: Gastroenterology;  Laterality: N/A;  1215   DILATATION & CURETTAGE/HYSTEROSCOPY WITH MYOSURE N/A 05/10/2017   Procedure: DILATATION & CURETTAGE/HYSTEROSCOPY WITH MYOSURE;  Surgeon: Ivery Marking, MD;  Location: [facility];  Service: Gynecology;  Laterality: N/A;   ESOPHAGOGASTRODUODENOSCOPY (EGD) WITH PROPOFOL  N/A 12/27/2021   Procedure: ESOPHAGOGASTRODUODENOSCOPY (EGD) WITH PROPOFOL ;  Surgeon: Urban Garden, MD;  [facility];  Service: Gastroenterology;  Laterality: N/A;  100   ESOPHAGOGASTRODUODENOSCOPY (EGD) WITH PROPOFOL  N/A 01/13/2022   Procedure: ESOPHAGOGASTRODUODENOSCOPY (EGD) WITH PROPOFOL ;  Surgeon: Urban Garden, MD;  [facility];  Service: Gastroenterology;  Laterality: N/A;  235, per Hugo Maes pt knows to arrive at 6:15   HEMOSTASIS CLIP PLACEMENT  01/13/2022   Procedure: HEMOSTASIS CLIP PLACEMENT;  Surgeon: Urban Garden, MD;  [facility]: [facility];  Service: Gastroenterology;;    INCISION AND DRAINAGE ABSCESS  02/23/2023   Procedure: INCISION AND DRAINAGE OF PERI RECTAL ABSCESS;  Surgeon: Melvenia Stabs, MD;  Location: [facility] [place];  Service: General;;   INTERVENTIONAL RADIOLOGY PROCEDURE     CT angiography /neck -Dr. Alvira Josephs.   IR ANGIO INTRA EXTRACRAN SEL COM CAROTID INNOMINATE BILAT MOD SED  02/22/2018   IR ANGIO INTRA EXTRACRAN SEL COM CAROTID INNOMINATE BILAT MOD SED  03/08/2022   IR ANGIO INTRA EXTRACRAN SEL COM CAROTID INNOMINATE BILAT MOD SED  09/18/2023   IR ANGIO VERTEBRAL SEL SUBCLAVIAN INNOMINATE UNI L MOD SED  09/18/2023   IR ANGIO VERTEBRAL SEL VERTEBRAL BILAT MOD SED  02/22/2018   IR ANGIO VERTEBRAL SEL VERTEBRAL UNI R MOD SED  03/08/2022   IR ANGIO VERTEBRAL SEL VERTEBRAL UNI R MOD SED  09/18/2023   IR GENERIC HISTORICAL  08/03/2016   IR RADIOLOGIST EVAL & MGMT 08/03/2016 [facility]   IR RADIOLOGIST EVAL & MGMT  01/03/2022   LAPAROSCOPIC GASTRIC SLEEVE RESECTION N/A 04/10/2016   Procedure: LAPAROSCOPIC GASTRIC SLEEVE RESECTION WITH UPPER ENDO;  Surgeon: Aldean Hummingbird, MD;  Location: [facility];  Service: General;  Laterality: N/A;   LEFT HEART CATHETERIZATION WITH CORONARY ANGIOGRAM N/A 09/28/2014   Procedure: LEFT HEART CATHETERIZATION WITH CORONARY ANGIOGRAM;  Surgeon: Lucendia Rusk, MD;  Location[facility] C[facility];  Service: Cardiovascular;  Laterality: N/A;   LIGATION OF INTERNAL FISTULA TRACT N/A 05/24/2023   Procedure: LIGATION OF INTERSPHINCTERIC FISTULOUS TRACT;  Surgeon: Melvenia Stabs, MD;  Location: [facility];  Service: General;  Laterality: N/A;  90   NEPHROLITHOTOMY Right 02/01/2022   Procedure: RIGHT NEPHROLITHOTOMY PERCUTANEOUS WITH SURGEON ACCESS;  Surgeon: Andrez Banker, MD;  Location: [facility];  Service: Urology;  Laterality: Right;   NEPHROLITHOTOMY Left 02/15/2022   Procedure: LEFT  NEPHROLITHOTOMY PERCUTANEOUS WITH SURGEON ACCESS, REMOVAL OF RIGHT URETERAL STENT;  Surgeon: Andrez Banker, MD;   Location: [facility];  Service: Urology;  Laterality: Left;  195 MINUTES   PLACEMENT OF SETON N/A 02/23/2023   Procedure: SURGICAL TREATMENT OF ANAL FISTULA-TRANSSPHINTERIL WITH PLACEMENT OF SETON;  Surgeon: Melvenia Stabs, MD;  Location: [facility];  Service: General;  Laterality: N/A;   POLYPECTOMY  01/13/2022   Procedure: POLYPECTOMY;  Surgeon: Urban Garden, MD;  [facility];  Service: Gastroenterology;;   RADIOLOGY WITH ANESTHESIA N/A 10/14/2014   Procedure: Neita Balo;  Surgeon: Luellen Sages, MD;  Location: [facility];  Service: Radiology;  Laterality: N/A;   RECTAL EXAM UNDER ANESTHESIA N/A 05/24/2023   Procedure: ANORECTAL EXAM UNDER ANESTHESIA;  Surgeon: Melvenia Stabs, MD;  Location: [facility];  Service: General;  Laterality: N/A;   TUBAL LIGATION       Allergies  Allergen Reactions   Dilaudid  [Hydromorphone  Hcl] Nausea And Vomiting   Crab Extract Swelling    Crab Cakes   Crab [Shellfish Allergy] Swelling   Hydrocodone      Don't like the way it feels   Other Other (See Comments)    pt is a difficult IV stick, prefers IV team paged for IV starts      Family History  Problem Relation Age of Onset   Cataracts Mother    Kidney disease Mother        dialysis for 26 years   Heart attack Mother        17's   Diabetes Father    Cataracts Father    Heart failure Father    Heart disease Father    Heart attack Father        33's   Stroke Paternal Grandmother      Social History Jill [name] reports that she has been smoking cigarettes. She started smoking about 48 years ago. She has a 12.1 pack-year smoking history. She has been exposed to tobacco smoke. She has never used smokeless tobacco. Jill [name] reports no history of alcohol use.     Physical Examination Today's Vitals   10/09/23 1531  BP: 130/64  Pulse: 71  SpO2: 95%  Weight: 225 lb (102.1 kg)  Height: 5' 6.5 (1.689 m)  PainSc: 4    PainLoc: Back   Body mass index is 35.77 kg/m.  Gen: resting comfortably, no acute distress HEENT: no scleral icterus, pupils equal round and reactive, no palptable cervical adenopathy,  CV: RRR, no m/rg, no jvd Resp: Clear to auscultation bilaterally GI: abdomen is soft, non-tender, non-distended, normal bowel sounds, no hepatosplenomegaly MSK: extremities are warm, no edema.  Skin: warm, no rash Neuro:  no focal deficits Psych: appropriate affect   Diagnostic Studies  08/2014 Lexiscan  MPI IMPRESSION: 1. Large area of ischemia extending from the basal to distal anteroseptal wall.   2. Normal left ventricular wall motion.   3. Left ventricular ejection fraction 60 for%   4. High-risk stress test findings*.   09/2014 Cath HEMODYNAMICS:  Aortic pressure was 142/66; LV pressure was 144/7; LVEDP 18.  There was no gradient between the left ventricle and aorta.     ANGIOGRAPHIC DATA:   The left main coronary artery is widely patent.   The left anterior descending artery is a large vessel which wraps around the apex. There is mild atherosclerosis in the mid vessel. There is a large diagonal vessel which arises proximally. There are several small diagonals which are  patent.   The left circumflex artery is a large dominant vessel. There is a ramus vessel which is large and widely patent. There is mild disease in the circumflex system. The first obtuse marginal is small but patent.  There is a small left PDA which is patent.   The right coronary artery is a nondominant vessel. In the mid vessel, there is moderate disease.   LEFT VENTRICULOGRAM:  Left ventricular angiogram was not done due to the question of a dye allergy.  LVEDP was 18 mmHg.   IMPRESSIONS:    Normal left main coronary artery. Mild disease in the left anterior descending artery and its branches. Mild disease in the dominant left circumflex artery and its branches. Moderate disease in the nondominant right coronary  artery. Left ventricular systolic function not assessed.  LVEDP 18 mmHg.  6.   False positive nuclear stress test.         Assessment and Plan   1. Orthostatic dizziness/syncope - continue hydration, we have cut back diuretic.    2. HTN - he is at goal, continue current meds     3. HLD - request labs from pcp   4. CAD - of note DAPT is for prior cartotid stenting per Dr Alvira Josephs lifelong therapy, DAPT is not for a cardiac reason.  - no symptoms, continue to monitor   5. Chronic diastolic HF - no symptoms, we prevoiusly lowered lasix  due to orthostatic symptoms  6. AMS - transient episodes altered mentation - refer to neurology  Laurann Pollock, M.D.

## 2023-10-09 NOTE — Patient Instructions (Signed)
 Medication Instructions:  Your physician has recommended you make the following change in your medication:   -Change Potassium to 20 meq every other day when you take your Lasix  *If you need a refill on your cardiac medications before your next appointment, please call your pharmacy*   Lab Work: None If you have labs (blood work) drawn today and your tests are completely normal, you will receive your results only by: MyChart Message (if you have MyChart) OR A paper copy in the mail If you have any lab test that is abnormal or we need to change your treatment, we will call you to review the results.   Testing/Procedures: None   Follow-Up: At [facility], you and your health needs are our priority.  As part of our continuing mission to provide you with exceptional heart care, we have created designated Provider Care Teams.  These Care Teams include your primary Cardiologist (physician) and Advanced Practice Providers (APPs -  Physician Assistants and Nurse Practitioners) who all work together to provide you with the care you need, when you need it.  We recommend signing up for the patient portal called "MyChart".  Sign up information is provided on this After Visit Summary.  MyChart is used to connect with patients for Virtual Visits (Telemedicine).  Patients are able to view lab/test results, encounter notes, upcoming appointments, etc.  Non-urgent messages can be sent to your provider as well.   To learn more about what you can do with MyChart, go to [email or web address].    Your next appointment:   6 month(s)  Provider:   You may see Dina Rich, MD or one of the following Advanced Practice Providers on your designated Care Team:   Randall An, PA-C  Jacolyn Reedy, PA-C     Other Instructions You have been referred to [facility]. They will call you with your first appointment.

## 2023-10-10 ENCOUNTER — Encounter: Payer: Self-pay | Admitting: Orthopedic Surgery

## 2023-10-16 ENCOUNTER — Ambulatory Visit (HOSPITAL_COMMUNITY)
Admission: RE | Admit: 2023-10-16 | Discharge: 2023-10-16 | Disposition: A | Discharge status: Home / Self Care | Source: Ambulatory Visit | Attending: Orthopedic Surgery | Admitting: Orthopedic Surgery

## 2023-10-16 ENCOUNTER — Telehealth: Payer: Self-pay | Admitting: Interventional Radiology

## 2023-10-16 DIAGNOSIS — M545 Low back pain, unspecified: Secondary | ICD-10-CM | POA: Insufficient documentation

## 2023-10-16 NOTE — Telephone Encounter (Signed)
 Spoke with patient with regards to her request for clearance to proceed with her imminent flight and cruise ship travels. Patient has been recently experiencing blurred vision and dizziness, without loss of consciousness. I confirmed patient's symptoms at time of call.   Per Dr. Corliss Skains, if patient is concerned about her symptoms, patient should be seen urgently. Dr. Corliss Skains is otherwise unable to provide feedback nor recommendations without first examining the patient. He does recommend patient maintains her daily doses of Aspirin 81 mg and Plavix 75 mg, and to inform staff at destination (and in route) of her symptoms and medical condition.  Patient inquired about motion sickness medications. I advised patient that, while there are available OTC medications available for motion sickness, she may benefit from consulting with her primary care first, as I am not fully aware of her medical history nor medication list, and cannot be certain there are no contra-indications to taking any of the OTC medications.  In case of medical emergency, worsening symptoms, or loss of consciousness, patient understands to present to the nearest emergency department promptly for evaluation, or call 911 service.  Patient voiced understanding.

## 2023-10-18 ENCOUNTER — Encounter: Payer: Self-pay | Admitting: Neurology

## 2023-10-18 ENCOUNTER — Ambulatory Visit (INDEPENDENT_AMBULATORY_CARE_PROVIDER_SITE_OTHER): Admitting: Neurology

## 2023-10-18 VITALS — BP 124/63 | HR 71 | Ht 66.5 in | Wt 224.5 lb

## 2023-10-18 DIAGNOSIS — Z5181 Encounter for therapeutic drug level monitoring: Secondary | ICD-10-CM | POA: Diagnosis not present

## 2023-10-18 DIAGNOSIS — G40909 Epilepsy, unspecified, not intractable, without status epilepticus: Secondary | ICD-10-CM | POA: Diagnosis not present

## 2023-10-18 MED ORDER — VALTOCO 20 MG DOSE 2 X 10 MG/0.1ML NA LQPK: 20.0000 mg | NASAL | 0 refills | Status: AC | PRN

## 2023-10-18 MED ORDER — LAMOTRIGINE 100 MG PO TABS: 100.0000 mg | ORAL_TABLET | Freq: Two times a day (BID) | ORAL | 3 refills | Status: DC

## 2023-10-18 NOTE — Patient Instructions (Signed)
 Increase lamotrigine to 100 mg twice daily Continue with Keppra 250 mg twice daily Will obtain Keppra level, lamotrigine level and BMP Routine EEG, I will contact you to go over results Will provide rescue medication as patient is getting ready to go on a cruise Follow-up in 6 months or sooner if worse.
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
## 2023-10-19 ENCOUNTER — Encounter: Payer: Self-pay | Admitting: Neurology

## 2023-10-19 ENCOUNTER — Ambulatory Visit (INDEPENDENT_AMBULATORY_CARE_PROVIDER_SITE_OTHER): Admitting: Neurology

## 2023-10-19 DIAGNOSIS — G40909 Epilepsy, unspecified, not intractable, without status epilepticus: Secondary | ICD-10-CM | POA: Diagnosis not present

## 2023-10-19 NOTE — Procedures (Signed)
    History:  58 year old woman with seizure disorder   EEG classification: Awake and drowsy  Duration: 25 minutes   Technical aspects: This EEG study was done with scalp electrodes positioned according to the 10-20 International system of electrode placement. Electrical activity was reviewed with band pass filter of 1-70Hz , sensitivity of 7 uV/mm, display speed of 24mm/sec with a 60Hz  notched filter applied as appropriate. EEG data were recorded continuously and digitally stored.   Description of the recording: The background rhythms of this recording consists of a fairly well modulated medium amplitude alpha rhythm of 9 Hz that is reactive to eye opening and closure. Present in the anterior head region is a 15-20 Hz beta activity. Photic stimulation was performed, did not show any abnormalities. Hyperventilation was also performed, did not show any abnormalities. Drowsiness was not seen. No abnormal epileptiform discharges seen during this recording. There was no focal slowing. There were no electrographic seizure identified.   Abnormality: None   Impression: This is a normal awake EEG. No evidence of interictal epileptiform discharges. Normal EEGs, however, do not rule out epilepsy.    Windell Norfolk, MD [facility]

## 2023-10-22 ENCOUNTER — Encounter: Payer: Self-pay | Admitting: Neurology

## 2023-10-22 NOTE — Telephone Encounter (Signed)
 Hey EEG was normal. It should be ok for her to have laser eye surgery.

## 2023-10-23 ENCOUNTER — Encounter: Payer: Self-pay | Admitting: Neurology

## 2023-10-23 LAB — BASIC METABOLIC PANEL WITH GFR
BUN/Creatinine Ratio: 16 (ref 9–23)
BUN: 18 mg/dL (ref 6–24)
CO2: 23 mmol/L (ref 20–29)
Calcium: 10.7 mg/dL — ABNORMAL HIGH (ref 8.7–10.2)
Chloride: 106 mmol/L (ref 96–106)
Creatinine, Ser: 1.16 mg/dL — ABNORMAL HIGH (ref 0.57–1.00)
Glucose: 128 mg/dL — ABNORMAL HIGH (ref 70–99)
Potassium: 4.2 mmol/L (ref 3.5–5.2)
Sodium: 144 mmol/L (ref 134–144)
eGFR: 55 mL/min/{1.73_m2} — ABNORMAL LOW (ref >59)

## 2023-10-23 LAB — LEVETIRACETAM LEVEL: Levetiracetam Lvl: 16.1 ug/mL (ref 10.0–40.0)

## 2023-10-23 LAB — LAMOTRIGINE LEVEL: Lamotrigine Lvl: 2 ug/mL (ref 2.0–20.0)

## 2023-10-26 ENCOUNTER — Ambulatory Visit (INDEPENDENT_AMBULATORY_CARE_PROVIDER_SITE_OTHER): Admitting: Orthopedic Surgery

## 2023-10-26 ENCOUNTER — Encounter: Payer: Self-pay | Admitting: Orthopedic Surgery

## 2023-10-26 DIAGNOSIS — M545 Low back pain, unspecified: Secondary | ICD-10-CM

## 2023-10-26 NOTE — Progress Notes (Signed)
 [facility] - Clinic Note  10/29/2023   CHIEF COMPLAINT Patient presents for Retina Follow Up  HISTORY OF PRESENT ILLNESS: Jill [name] is a 58 y.o. female who presents to the clinic today for:  HPI     Retina Follow Up   Patient presents with  Diabetic Retinopathy.  In both eyes.  This started 4 weeks ago.  Duration of 4 weeks.  Since onset it is stable.  I, the attending physician,  performed the HPI with the patient and updated documentation appropriately.        Comments   4 week retina follow up PDR OU pt is reporting no vision changes noticed she denies any flashes or floaters her last reading 108      Last edited by Rennis Chris, MD on 10/29/2023 10:21 PM.     Referring physician: Pecolia Ades, MD [address] [place],  [place] [address]  HISTORICAL INFORMATION:  Selected notes from the MEDICAL RECORD NUMBER Referred by Dr. Ilsa Iha for diabetic retinopathy eval -- new subhyaloid heme OD LEE:  Ocular Hx- PMH-   CURRENT MEDICATIONS: No current outpatient medications on file. (Ophthalmic Drugs)   No current facility-administered medications for this visit. (Ophthalmic Drugs)   Current Outpatient Medications (Other)  Medication Sig   acetaminophen (TYLENOL) 500 MG tablet Take 500-1,000 mg by mouth every 8 (eight) hours as needed for moderate pain or headache.    aspirin EC 81 MG tablet Take 81 mg by mouth every morning.   bismuth subsalicylate (PEPTO BISMOL) 262 MG chewable tablet Chew 524 mg by mouth 3 (three) times daily as needed (stomach pain.).   brompheniramine-pseudoephedrine-DM 30-2-10 MG/5ML syrup Take 5 mLs by mouth 4 (four) times daily as needed.   Calcium-Phosphorus-Vitamin D (CITRACAL +D3 PO) Take 2 tablets by mouth in the morning and at bedtime.   clopidogrel (PLAVIX) 75 MG tablet Take 75 mg by mouth in the morning.   Cyanocobalamin (VITAMIN B-12) 5000 MCG SUBL Place 5,000 mcg under the tongue 3 (three) times a week.    cyclobenzaprine (FLEXERIL) 10 MG tablet Take 1 tablet (10 mg total) by mouth 2 (two) times daily as needed.   diazePAM, 20 MG Dose, (VALTOCO 20 MG DOSE) 2 x 10 MG/0.1ML LQPK Place 20 mg into the nose as needed (for seizure).   docusate sodium (COLACE) 100 MG capsule Take 100 mg by mouth in the morning.   ezetimibe (ZETIA) 10 MG tablet Take 1 tablet (10 mg total) by mouth daily.   felodipine (PLENDIL) 5 MG 24 hr tablet Take 5 mg by mouth in the morning.   furosemide (LASIX) 20 MG tablet Take 1 tablet (20 mg total) by mouth every other day. (Patient taking differently: Take 20 mg by mouth every other day. Taking PRN for swelling)   gabapentin (NEURONTIN) 300 MG capsule Take 300 mg by mouth 2 (two) times daily.   lamoTRIgine (LAMICTAL) 100 MG tablet Take 1 tablet (100 mg total) by mouth 2 (two) times daily.   levETIRAcetam (KEPPRA) 500 MG tablet Take 250 mg by mouth 2 (two) times daily.   linaclotide (LINZESS) 145 MCG CAPS capsule Take 145 mcg by mouth daily before breakfast.   LORazepam (ATIVAN) 0.5 MG tablet Take 0.5 mg by mouth daily as needed for anxiety.   metFORMIN (GLUCOPHAGE) 500 MG tablet Take 500 mg by mouth 2 (two) times daily.   metoprolol succinate (TOPROL-XL) 25 MG 24 hr tablet Take 1 tablet by mouth daily   Multiple Vitamins-Minerals (  BARIATRIC MULTIVITAMINS/IRON PO) Take 1 tablet by mouth every evening.   olmesartan (BENICAR) 40 MG tablet Take 40 mg by mouth every morning.    OZEMPIC, 1 MG/DOSE, 2 MG/1.5ML SOPN Inject 1 mg into the skin every 30 (thirty) days.   pantoprazole (PROTONIX) 40 MG tablet Take 1 tablet (40 mg total) by mouth 2 (two) times daily.   pioglitazone (ACTOS) 30 MG tablet Take 30 mg by mouth every morning.   potassium chloride (KLOR-CON M) 10 MEQ tablet Take 2 tablets (20 mEq total) by mouth every other day. Take on days you take Lasix.   predniSONE (STERAPRED UNI-PAK 21 TAB) 10 MG (21) TBPK tablet 10 mg DS 12 as directed   rosuvastatin (CRESTOR) 40 MG tablet Take  1 tablet (40 mg total) by mouth at bedtime.   No current facility-administered medications for this visit. (Other)   REVIEW OF SYSTEMS: ROS   Positive for: Gastrointestinal, Neurological, Endocrine, Cardiovascular, Eyes Negative for: Constitutional, Skin, Genitourinary, Musculoskeletal, HENT, Respiratory, Psychiatric, Allergic/Imm, Heme/Lymph Last edited by Etheleen Mayhew, COT on 10/29/2023  1:24 PM.     ALLERGIES Allergies  Allergen Reactions   Dilaudid [Hydromorphone Hcl] Nausea And Vomiting   Crab Extract Swelling    Crab Cakes   Crab [Shellfish Allergy] Swelling   Hydrocodone     "Don't like the way it feels"   Other Other (See Comments)    "pt is a difficult IV stick, prefers IV team paged for IV starts"   PAST MEDICAL HISTORY Past Medical History:  Diagnosis Date   Anemia    Anxiety    Back pain    complains when laying on hard flat surface   Chronic kidney disease    appt. /w urology 11/10/2014- for a cyst seen on [place]. evaluated was told it was nothing.   Diabetes mellitus    Diabetes II, oral and GLP-1.   DVT (deep venous thrombosis) (HCC)    right leg '09 or '10   Fibroids    GERD (gastroesophageal reflux disease)    Heart murmur    History of kidney stones    History of stress test    referred for cardiac cath- done here at [facility]- 09/2014   Hypercholesterolemia    Hypertension    Neuromuscular disorder (HCC)    neuropathy   PONV (postoperative nausea and vomiting)    Seizures (HCC)    02/01/23- last seizure approximately 2015;   Sleep apnea    using cpap as of 05/23/23   Stroke [facility])    '09-had blockage in brain, too deep to operate"occ left lower extremity fatique, occ. 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   Past Surgical History:  Procedure Laterality Date   APPENDECTOMY     BIOPSY  11/18/2021   Procedure: BIOPSY;  Surgeon: Dolores Frame, MD;   [facility]: [facility];  Service: Gastroenterology;;  random colon;   BIOPSY  12/27/2021   Procedure: BIOPSY;  Surgeon: Dolores Frame, MD;  [facility];  Service: Gastroenterology;;   CARDIAC CATHETERIZATION     CESAREAN SECTION  1986 & 1988   x2   CHOLECYSTECTOMY     open"gallstones"   COLONOSCOPY WITH PROPOFOL N/A 11/18/2021   Procedure: COLONOSCOPY WITH PROPOFOL;  Surgeon: Dolores Frame, MD;  [facility]: [facility];  Service: Gastroenterology;  Laterality: N/A;  1215   DILATATION & CURETTAGE/HYSTEROSCOPY WITH MYOSURE N/A  05/10/2017   Procedure: DILATATION & CURETTAGE/HYSTEROSCOPY WITH MYOSURE;  Surgeon: Maxie Better, MD;  Location: [facility];  Service: Gynecology;  Laterality: N/A;   ESOPHAGOGASTRODUODENOSCOPY (EGD) WITH PROPOFOL N/A 12/27/2021   Procedure: ESOPHAGOGASTRODUODENOSCOPY (EGD) WITH PROPOFOL;  Surgeon: Dolores Frame, MD;  [facility];  Service: Gastroenterology;  Laterality: N/A;  100   ESOPHAGOGASTRODUODENOSCOPY (EGD) WITH PROPOFOL N/A 01/13/2022   Procedure: ESOPHAGOGASTRODUODENOSCOPY (EGD) WITH PROPOFOL;  Surgeon: Dolores Frame, MD;  [facility];  Service: Gastroenterology;  Laterality: N/A;  235, per Soledad Gerlach pt knows to arrive at 6:15   HEMOSTASIS CLIP PLACEMENT  01/13/2022   Procedure: HEMOSTASIS CLIP PLACEMENT;  Surgeon: Dolores Frame, MD;  [facility];  Service: Gastroenterology;;   INCISION AND DRAINAGE ABSCESS  02/23/2023   Procedure: INCISION AND DRAINAGE OF PERI RECTAL ABSCESS;  Surgeon: Andria Meuse, MD;  Location: [facility];  Service: General;;   INTERVENTIONAL RADIOLOGY PROCEDURE     CT angiography /neck -Dr. Corliss Skains.   IR ANGIO INTRA EXTRACRAN SEL COM CAROTID INNOMINATE BILAT MOD SED  02/22/2018   IR ANGIO INTRA EXTRACRAN SEL COM CAROTID INNOMINATE BILAT MOD SED  03/08/2022   IR ANGIO INTRA EXTRACRAN SEL COM CAROTID INNOMINATE BILAT  MOD SED  09/18/2023   IR ANGIO VERTEBRAL SEL SUBCLAVIAN INNOMINATE UNI L MOD SED  09/18/2023   IR ANGIO VERTEBRAL SEL VERTEBRAL BILAT MOD SED  02/22/2018   IR ANGIO VERTEBRAL SEL VERTEBRAL UNI R MOD SED  03/08/2022   IR ANGIO VERTEBRAL SEL VERTEBRAL UNI R MOD SED  09/18/2023   IR GENERIC HISTORICAL  08/03/2016   IR RADIOLOGIST EVAL & MGMT 08/03/2016 [facility]   IR RADIOLOGIST EVAL & MGMT  01/03/2022   LAPAROSCOPIC GASTRIC SLEEVE RESECTION N/A 04/10/2016   Procedure: LAPAROSCOPIC GASTRIC SLEEVE RESECTION WITH UPPER ENDO;  Surgeon: Gaynelle Adu, MD;  Location: [facility];  Service: General;  Laterality: N/A;   LEFT HEART CATHETERIZATION WITH CORONARY ANGIOGRAM N/A 09/28/2014   Procedure: LEFT HEART CATHETERIZATION WITH CORONARY ANGIOGRAM;  Surgeon: Corky Crafts, MD;  Location: [facility] CATH [facility];  Service: Cardiovascular;  Laterality: N/A;   LIGATION OF INTERNAL FISTULA TRACT N/A 05/24/2023   Procedure: LIGATION OF INTERSPHINCTERIC FISTULOUS TRACT;  Surgeon: Andria Meuse, MD;  Location: [facility];  Service: General;  Laterality: N/A;  90   NEPHROLITHOTOMY Right 02/01/2022   Procedure: RIGHT NEPHROLITHOTOMY PERCUTANEOUS WITH SURGEON ACCESS;  Surgeon: Crist Fat, MD;  Location: [facility];  Service: Urology;  Laterality: Right;   NEPHROLITHOTOMY Left 02/15/2022   Procedure: LEFT NEPHROLITHOTOMY PERCUTANEOUS WITH SURGEON ACCESS, REMOVAL OF RIGHT URETERAL STENT;  Surgeon: Crist Fat, MD;  Location: [facility];  Service: Urology;  Laterality: Left;  195 MINUTES   PLACEMENT OF SETON N/A 02/23/2023   Procedure: SURGICAL TREATMENT OF ANAL FISTULA-TRANSSPHINTERIL WITH PLACEMENT OF SETON;  Surgeon: Andria Meuse, MD;  Location: [facility];  Service: General;  Laterality: N/A;   POLYPECTOMY  01/13/2022   Procedure: POLYPECTOMY;  Surgeon: Dolores Frame, MD;  [facility];  Service: Gastroenterology;;   RADIOLOGY WITH ANESTHESIA N/A 10/14/2014    Procedure: Mingo Amber;  Surgeon: Julieanne Cotton, MD;  Location: [facility];  Service: Radiology;  Laterality: N/A;   RECTAL EXAM UNDER ANESTHESIA N/A 05/24/2023   Procedure: ANORECTAL EXAM UNDER ANESTHESIA;  Surgeon: Andria Meuse, MD;  Location: [facility];  Service: General;  Laterality: N/A;   TUBAL LIGATION  FAMILY HISTORY Family History  Problem Relation Age of Onset   Cataracts Mother    Kidney disease Mother        dialysis for 26 years   Heart attack Mother        37's   Diabetes Father    Cataracts Father    Heart failure Father    Heart disease Father    Heart attack Father        56's   Stroke Paternal Grandmother    SOCIAL HISTORY Social History   Tobacco Use   Smoking status: Every Day    Current packs/day: 0.25    Average packs/day: 0.3 packs/day for 48.3 years (12.1 ttl pk-yrs)    Types: Cigarettes    Start date: 07/25/1975    Passive exposure: Current   Smokeless tobacco: Never  Vaping Use   Vaping status: Never Used  Substance Use Topics   Alcohol use: Never   Drug use: No       OPHTHALMIC EXAM:  Base Eye Exam     Visual Acuity (Snellen - Linear)       Right Left   Dist cc 20/20 -2 20/60   Dist ph cc  20/30         Tonometry (Tonopen, 1:37 PM)       Right Left   Pressure 16 18  Squeezing         Pupils       Pupils Dark Light Shape React APD   Right PERRL 2 1 Round Brisk None   Left PERRL 2 1 Round Brisk None         Visual Fields       Left Right    Full          Extraocular Movement       Right Left    Full, Ortho Full, Ortho         Neuro/Psych     Oriented x3: Yes   Mood/Affect: Normal         Dilation     Both eyes: 2.5% Phenylephrine @ 1:37 PM           Slit Lamp and Fundus Exam     Slit Lamp Exam       Right Left   Lids/Lashes Dermatochalasis - upper lid Dermatochalasis - upper lid   Conjunctiva/Sclera mild melanosis mild melanosis   Cornea tear film debris  tear film debris   Anterior Chamber deep and clear deep and clear   Iris Round and dilated, No NVI Round and dilated, No NVI   Lens 2+ Nuclear sclerosis, 2-3+ Cortical cataract 2+ Nuclear sclerosis, 2-3+ Cortical cataract, 2+ Posterior subcapsular cataract   Anterior Vitreous Vitreous syneresis, stable improvement in vitreous and subhyaloid heme, white vitreous condensations inferiorly Vitreous syneresis         Fundus Exam       Right Left   Disc Pink and Sharp, no NVD, +fibrosis Pink and Sharp, +cupping   C/D Ratio 0.5 0.65   Macula Flat, Good foveal reflex, RPE mottling and clumping, No heme or edema Flat, Good foveal reflex, RPE mottling and clumping, scattered MA/DBH -- improved, no frank edema   Vessels attenuated, Tortuous, scattered NVE IN midzone attenuated, tortuous, early midzonal NVE   Periphery Attached, boat shaped subhyaloid heme inferior midzone, scattered NVE IN midzone Attached, scattered MA, white now boat shaped subhayloid / pre retinal heme inferior midzone -- stably improved, good PRP changes nasal hemisphere  Refraction     Wearing Rx       Sphere Cylinder Axis   Right -2.00 +0.50 178   Left -2.00 +0.75 014           IMAGING AND PROCEDURES  Imaging and Procedures for 10/29/2023  OCT, Retina - OU - Both Eyes       Right Eye Quality was good. Central Foveal Thickness: 206. Progression has improved. Findings include normal foveal contour, no IRF, no SRF, intraretinal hyper-reflective material (Trace cystic changes ST mac -- improved, Partial PVD with mild stable improvement in vitreous opacities).   Left Eye Quality was good. Central Foveal Thickness: 214. Progression has been stable. Findings include normal foveal contour, no IRF, no SRF, vitreomacular adhesion (No DME, Mild diffuse atrophy, stable improvement in pre-retinal hyper reflective material along inferior arcades, stable improvement in vitreous opacities).   Notes *Images captured  and stored on drive  Diagnosis / Impression:  No DME OU OD: Trace cystic changes ST mac -- improved, Partial PVD with mild stable improvement in vitreous opacities OS: No DME, Mild diffuse atrophy, stable improvement in pre-retinal hyper reflective material along inferior arcades, stable improvement in vitreous opacities  Clinical management:  See below  Abbreviations: NFP - Normal foveal profile. CME - cystoid macular edema. PED - pigment epithelial detachment. IRF - intraretinal fluid. SRF - subretinal fluid. EZ - ellipsoid zone. ERM - epiretinal membrane. ORA - outer retinal atrophy. ORT - outer retinal tubulation. SRHM - subretinal hyper-reflective material. IRHM - intraretinal hyper-reflective material           ASSESSMENT/PLAN:   ICD-10-CM   1. 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 Subhyaloid hemorrhage of both eyes  H35.63     3. Long term (current) use of oral hypoglycemic drugs  Z79.84     4. Long-term (current) use of injectable non-insulin antidiabetic drugs  Z79.85     5. Essential hypertension  I10     6. Hypertensive retinopathy of both eyes  H35.033     7. Combined forms of age-related cataract of both eyes  H25.813     8. Subhyaloid hemorrhage of right eye  H35.61      1-4. Proliferative diabetic retinopathy w/o DME, OU - s/p IVA OD #1 (05.23.24), #2 (06.24.24), #3 (06.24.24), #4 (08.20.24), #5 (09.17.24), #6 (10.15.24) #7(03.14.25),  - s/p IVA OS #1 (06.24.24), #2 (06.24.24), #3 (08.20.24), #4 (09.17.24), #5 (10.15.24), #6 (12.03.24) - s/p PRP OS (06.03.24) -- incomplete / aborted due to history of photogenic seizures  - mild VH improved OU - FA (05.23.24) shows OD: Boat shaped blockage inferior midzone from subhyaloid heme, focal NVE and vascular non-perfusion IN midzone; OS: Scattered punctate NVE greatest inferior midzone -- pt needs  PRP OU - repeat FA (07.23.24): OD: low fluorescein signal, focal NVE and vascular non-perfusion IN midzone -- improved; OS: Low fluorescein signal, punctate NVE greatest inferior midzone -- improved, new boat shaped heme inferior to disc - repeat FA (10.15.24) OD: focal NVE and vascular non-perfusion IN midzone -- improved w/ regression of NV, no leakage; OS: punctate NVE greatest inferior midzone -- resolved, boat shaped heme inferior to disc- resolved - repeat FA today shows OD: vascular non-perfusion with interval re-development of NVE IN midzone; OS: punctate NVE greatest inferior midzone -- slightly worse, boat shaped heme inferior to disc -- resolved **recurrent NVE with stoppage of  anti-VEGF therapy -- may benefit from PRP to more permanently treat NVE** - due to history of photogenic seizures, PRP would need to happen under general anesthesia - will plan for PRP OU under GA on 04.24.25 - informed consent obtained and signed 05.23.24 (OU) - will plan for PRP OU in the OR on April 24th - f/u April 25th, POD 1  5,6. Hypertensive retinopathy OU - discussed importance of tight BP control - monitor  7. Mixed Cataract OU - The symptoms of cataract, surgical options, and treatments and risks were discussed with patient. - discussed diagnosis and progression - monitor  8. Hx of photogenic seizures  Ophthalmic Meds Ordered this visit:  No orders of the defined types were placed in this encounter.    Return in about 18 days (around 11/16/2023) for f/u s/p PRP OU, POV, DFE, OCT.  There are no Patient Instructions on file for this visit.  This document serves as a record of services personally performed by Karie Chimera, MD, PhD. It was created on their behalf by Berlin Hun COT, an ophthalmic technician. The creation of this record is the provider's dictation and/or activities during the visit.    Electronically signed by: Berlin Hun COT 04.04.2510:23 PM  This document  serves as a record of services personally performed by Karie Chimera, MD, PhD. It was created on their behalf by Glee Arvin. Manson Passey, OA an ophthalmic technician. The creation of this record is the provider's dictation and/or activities during the visit.    Electronically signed by: Glee Arvin. Manson Passey, OA 10/29/23 10:23 PM  Karie Chimera, M.D., Ph.D. Diseases & Surgery of the Retina and Vitreous [facility] 10/29/2023   I have reviewed the above documentation for accuracy and completeness, and I agree with the above. Karie Chimera, M.D., Ph.D. 10/29/23 10:25 PM   Abbreviations: M myopia (nearsighted); A astigmatism; H hyperopia (farsighted); P presbyopia; Mrx spectacle prescription;  CTL contact lenses; OD right eye; OS left eye; OU both eyes  XT exotropia; ET esotropia; PEK punctate epithelial keratitis; PEE punctate epithelial erosions; DES dry eye syndrome; MGD meibomian gland dysfunction; ATs artificial tears; PFAT's preservative free artificial tears; NSC nuclear sclerotic cataract; PSC posterior subcapsular cataract; ERM epi-retinal membrane; PVD posterior vitreous detachment; RD retinal detachment; DM diabetes mellitus; DR diabetic retinopathy; NPDR non-proliferative diabetic retinopathy; PDR proliferative diabetic retinopathy; CSME clinically significant macular edema; DME diabetic macular edema; dbh dot blot hemorrhages; CWS cotton wool spot; POAG primary open angle glaucoma; C/D cup-to-disc ratio; HVF humphrey visual field; GVF goldmann visual field; OCT optical coherence tomography; IOP intraocular pressure; BRVO Branch retinal vein occlusion; CRVO central retinal vein occlusion; CRAO central retinal artery occlusion; BRAO branch retinal artery occlusion; RT retinal tear; SB scleral buckle; PPV pars plana vitrectomy; VH Vitreous hemorrhage; PRP panretinal laser photocoagulation; IVK intravitreal kenalog; VMT vitreomacular traction; MH Macular hole;  NVD  neovascularization of the disc; NVE neovascularization elsewhere; AREDS age related eye disease study; ARMD age related macular degeneration; POAG primary open angle glaucoma; EBMD epithelial/anterior basement membrane dystrophy; ACIOL anterior chamber intraocular lens; IOL intraocular lens; PCIOL posterior chamber intraocular lens; Phaco/IOL phacoemulsification with intraocular lens placement; PRK photorefractive keratectomy; LASIK laser assisted in situ keratomileusis; HTN hypertension; DM diabetes mellitus; COPD chronic obstructive pulmonary disease

## 2023-10-28 NOTE — Progress Notes (Signed)
 Orthopaedic Clinic Return  Assessment: Jill [name] is a 58 y.o. female with the following: Back pain   Plan: [name] continues to have pain.  She is concerned about radiating pains, as well as worsening strength and mobility.  MRI demonstrates some areas of trace anterolisthesis, as well as some facet hypertrophy.  We discussed next options including referral for injections versus referral to a spine surgeon.  She would like to meet with Dr. Christell Constant to discuss these findings, and potential treatment options.   Follow-up: Return for referral to Dr. Christell Constant.   Subjective:  Chief Complaint  Patient presents with   Follow-up    MRI lumbar spine review.     History of Present Illness: Jill [name] is a 58 y.o. female who returns to clinic for repeat evaluation of low back pain.  Her back continues to bother her.  She does have radiating pain to the left leg.  She continues to use a cane to assist with ambulation.  She has obtained an MRI, and is here to discuss the findings.   Review of Systems: No fevers or chills No numbness or tingling No chest pain No shortness of breath No bowel or bladder dysfunction No GI distress No headaches   Objective: LMP 12/10/2014 Comment: pt states that there is no chance or pregnancy  Physical Exam:  Alert and oriented.  No acute distress.  Ambulating with a cane.  Diffuse tenderness to palpation in the lower back.  Mild weakness is appreciated in the left lower extremity.  2+ patellar tendon reflexes.  Sensation is intact throughout bilateral lower extremities.  IMAGING: I personally ordered and reviewed the following images:  Lumbar spine MRI  IMPRESSION: 1. L3-4: Bilateral facet degeneration and hypertrophy with 2 mm of degenerative anterolisthesis. Mild bulging of the disc. Prominent epidural fat. Mild multifactorial stenosis that could be symptomatic. The facet arthropathy would also likely be painful. 2. L4-5:  Bilateral facet arthropathy with gaping, fluid-filled joints. Anterolisthesis of 2 mm that would likely worsen with standing or flexion. Shallow protrusion of the disc. Moderate multifactorial stenosis that would likely worsen with standing or flexion and could cause neural compression on either or both sides. The facet arthritis would also likely be painful.    Oliver Barre, MD 10/28/2023 10:54 AM

## 2023-10-29 ENCOUNTER — Ambulatory Visit (INDEPENDENT_AMBULATORY_CARE_PROVIDER_SITE_OTHER): Admitting: Ophthalmology

## 2023-10-29 ENCOUNTER — Encounter (INDEPENDENT_AMBULATORY_CARE_PROVIDER_SITE_OTHER): Payer: Self-pay | Admitting: Ophthalmology

## 2023-10-29 DIAGNOSIS — Z7984 Long term (current) use of oral hypoglycemic drugs: Secondary | ICD-10-CM | POA: Diagnosis not present

## 2023-10-29 DIAGNOSIS — H3563 Retinal hemorrhage, bilateral: Secondary | ICD-10-CM

## 2023-10-29 DIAGNOSIS — I10 Essential (primary) hypertension: Secondary | ICD-10-CM

## 2023-10-29 DIAGNOSIS — H3561 Retinal hemorrhage, right eye: Secondary | ICD-10-CM

## 2023-10-29 DIAGNOSIS — Z7985 Long-term (current) use of injectable non-insulin antidiabetic drugs: Secondary | ICD-10-CM

## 2023-10-29 DIAGNOSIS — E113593 Type 2 diabetes mellitus with proliferative diabetic retinopathy without macular edema, bilateral: Secondary | ICD-10-CM

## 2023-10-29 DIAGNOSIS — H25813 Combined forms of age-related cataract, bilateral: Secondary | ICD-10-CM

## 2023-10-29 DIAGNOSIS — H35033 Hypertensive retinopathy, bilateral: Secondary | ICD-10-CM

## 2023-11-11 ENCOUNTER — Telehealth: Payer: Self-pay | Admitting: Cardiology

## 2023-11-11 NOTE — Telephone Encounter (Signed)
 Outpatient service line: Cramping  Patient called reporting that she has recently been on a cruise for the last several days and been drinking.  She reports today that she has had some cramping but no significant swelling.  Discussed with her there is various reasons that all are probably benign for her cramping.  Encouraged hydration, electrolyte replacement, stretching.  Very low suspicion for DVT given she has been active and walking.  Gave ER precautions.  Likely does not need any further workup.  She will call back if there is any other issue.

## 2023-11-13 NOTE — H&P (Signed)
 Jill [name] is an 58 y.o. female.    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 We have recommended panretinal photocoagulation (PRP) laser to treat the patient's active PDR, but due to her history of photogenic seizures, we have recommended the laser procedure be done under general anesthesia. After a discussion of the risks, benefits, and alternatives to the laser, the patient has elected to proceed with the PRP laser procedure under general anesthesia to treat her PDR OU.  Past Medical History:  Diagnosis Date   Anemia    Anxiety    Back pain    complains when laying on hard flat surface   Chronic kidney disease    appt. /w urology 11/10/2014- for a cyst seen on US . evaluated was told it was nothing.   Diabetes mellitus    Diabetes II, oral and GLP-1.   DVT (deep venous thrombosis) (HCC)    right leg '09 or '10   Fibroids    GERD (gastroesophageal reflux disease)    Heart murmur    History of kidney stones    History of stress test    referred for cardiac cath- done here at [facility]- 09/2014   Hypercholesterolemia    Hypertension    Neuromuscular disorder (HCC)    neuropathy   PONV (postoperative nausea and vomiting)    Seizures (HCC)    02/01/23- last seizure approximately 2015;   Sleep apnea    using cpap as of 05/23/23   Stroke [facility])    '09-had blockage in brain, too deep to operate"occ left lower extremity fatique, occ. 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    Past Surgical History:  Procedure Laterality Date   APPENDECTOMY     BIOPSY  11/18/2021   Procedure: BIOPSY;  Surgeon: Urban Garden, MD;  [facility];  Service: Gastroenterology;;  random colon;   BIOPSY  12/27/2021   Procedure: BIOPSY;  Surgeon: Urban Garden, MD;  [facility];  Service: Gastroenterology;;   CARDIAC CATHETERIZATION     CESAREAN SECTION  1986 & 1988   x2   CHOLECYSTECTOMY     open"gallstones"   COLONOSCOPY WITH PROPOFOL  N/A 11/18/2021   Procedure: COLONOSCOPY WITH PROPOFOL ;  Surgeon: Urban Garden, MD;  [facility];  Service: Gastroenterology;  Laterality: N/A;  1215   DILATATION & CURETTAGE/HYSTEROSCOPY WITH MYOSURE N/A 05/10/2017   Procedure: DILATATION & CURETTAGE/HYSTEROSCOPY WITH MYOSURE;  Surgeon: Ivery Marking, MD;  Location: [facility];  Service: Gynecology;  Laterality: N/A;   ESOPHAGOGASTRODUODENOSCOPY (EGD) WITH PROPOFOL  N/A 12/27/2021   Procedure: ESOPHAGOGASTRODUODENOSCOPY (EGD) WITH PROPOFOL ;  Surgeon: Urban Garden, MD;  [facility];  Service: Gastroenterology;  Laterality: N/A;  100   ESOPHAGOGASTRODUODENOSCOPY (EGD) WITH PROPOFOL  N/A 01/13/2022   Procedure: ESOPHAGOGASTRODUODENOSCOPY (EGD) WITH PROPOFOL ;  Surgeon: Urban Garden, MD;  [facility];  Service: Gastroenterology;  Laterality: N/A;  235, per Hugo Maes pt knows to arrive at 6:15   HEMOSTASIS CLIP PLACEMENT  01/13/2022   Procedure: HEMOSTASIS CLIP PLACEMENT;  Surgeon: Urban Garden, MD;  [facility];  Service: Gastroenterology;;   INCISION AND DRAINAGE ABSCESS  02/23/2023   Procedure: INCISION AND DRAINAGE OF PERI RECTAL ABSCESS;  Surgeon: Melvenia Stabs, MD;  Location: [facility];  Service: General;;  INTERVENTIONAL RADIOLOGY PROCEDURE     CT angiography /neck -Dr. Alvira Josephs.   IR ANGIO INTRA EXTRACRAN SEL COM CAROTID INNOMINATE BILAT MOD SED  02/22/2018   IR ANGIO INTRA EXTRACRAN SEL COM CAROTID INNOMINATE BILAT MOD SED  03/08/2022   IR ANGIO INTRA EXTRACRAN SEL COM CAROTID INNOMINATE BILAT MOD SED  09/18/2023   IR ANGIO VERTEBRAL SEL SUBCLAVIAN INNOMINATE UNI L MOD SED  09/18/2023   IR ANGIO VERTEBRAL SEL VERTEBRAL BILAT MOD SED  02/22/2018   IR ANGIO  VERTEBRAL SEL VERTEBRAL UNI R MOD SED  03/08/2022   IR ANGIO VERTEBRAL SEL VERTEBRAL UNI R MOD SED  09/18/2023   IR GENERIC HISTORICAL  08/03/2016   IR RADIOLOGIST EVAL & MGMT 08/03/2016 [facility]   IR RADIOLOGIST EVAL & MGMT  01/03/2022   LAPAROSCOPIC GASTRIC SLEEVE RESECTION N/A 04/10/2016   Procedure: LAPAROSCOPIC GASTRIC SLEEVE RESECTION WITH UPPER ENDO;  Surgeon: Aldean Hummingbird, MD;  Location: [facility];  Service: General;  Laterality: N/A;   LEFT HEART CATHETERIZATION WITH CORONARY ANGIOGRAM N/A 09/28/2014   Procedure: LEFT HEART CATHETERIZATION WITH CORONARY ANGIOGRAM;  Surgeon: Lucendia Rusk, MD;  Location: [facility] CATH LAB;  Service: Cardiovascular;  Laterality: N/A;   LIGATION OF INTERNAL FISTULA TRACT N/A 05/24/2023   Procedure: LIGATION OF INTERSPHINCTERIC FISTULOUS TRACT;  Surgeon: Melvenia Stabs, MD;  Location: [facility];  Service: General;  Laterality: N/A;  90   NEPHROLITHOTOMY Right 02/01/2022   Procedure: RIGHT NEPHROLITHOTOMY PERCUTANEOUS WITH SURGEON ACCESS;  Surgeon: Andrez Banker, MD;  Location: [facility];  Service: Urology;  Laterality: Right;   NEPHROLITHOTOMY Left 02/15/2022   Procedure: LEFT NEPHROLITHOTOMY PERCUTANEOUS WITH SURGEON ACCESS, REMOVAL OF RIGHT URETERAL STENT;  Surgeon: Andrez Banker, MD;  Location: [facility];  Service: Urology;  Laterality: Left;  195 MINUTES   PLACEMENT OF SETON N/A 02/23/2023   Procedure: SURGICAL TREATMENT OF ANAL FISTULA-TRANSSPHINTERIL WITH PLACEMENT OF SETON;  Surgeon: Melvenia Stabs, MD;  Location: [facility];  Service: General;  Laterality: N/A;   POLYPECTOMY  01/13/2022   Procedure: POLYPECTOMY;  Surgeon: Urban Garden, MD;  [facility];  Service: Gastroenterology;;   RADIOLOGY WITH ANESTHESIA N/A 10/14/2014   Procedure: Neita Balo;  Surgeon: Luellen Sages, MD;  Location: [facility];  Service: Radiology;  Laterality: N/A;   RECTAL EXAM UNDER ANESTHESIA N/A 05/24/2023    Procedure: ANORECTAL EXAM UNDER ANESTHESIA;  Surgeon: Melvenia Stabs, MD;  Location: [facility];  Service: General;  Laterality: N/A;   TUBAL LIGATION      Family History  Problem Relation Age of Onset   Cataracts Mother    Kidney disease Mother        dialysis for 26 years   Heart attack Mother        17's   Diabetes Father    Cataracts Father    Heart failure Father    Heart disease Father    Heart attack Father        28's   Stroke Paternal Grandmother    Social History:  reports that she has been smoking cigarettes. She started smoking about 48 years ago. She has a 12.1 pack-year smoking history. She has been exposed to tobacco smoke. She has never used smokeless tobacco. She reports that she does not drink alcohol and does not use drugs.  Allergies:  Allergies  Allergen Reactions   Dilaudid  [Hydromorphone  Hcl] Nausea And Vomiting   Crab Extract Swelling    Crab Cakes  Crab [Shellfish Allergy] Swelling   Hydrocodone      "Don't like the way it feels"   Other Other (See Comments)    "pt is a difficult IV stick, prefers IV team paged for IV starts"    No medications prior to admission.    Review of systems otherwise negative  Last menstrual period 12/10/2014.  Physical exam: Mental status: oriented x3. Eyes: See eye exam associated with this date of surgery Ears, Nose, Throat: within normal limits Neck: Within Normal limits General: within normal limits Chest: Within normal limits Breast: deferred Heart: Within normal limits Abdomen: Within normal limits GU: deferred Extremities: within normal limits Skin: within normal limits  Assessment/Plan 1. Proliferative diabetic retinopathy OU  Plan: To [facility] for panretinal photocoagulation laser using laser indirect ophthalmoscopy OU under general anesthesia - case scheduled for Thursday, 04.24.25, 1130 am, [facility] OR 08  Jeanice Millard, M.D., Ph.D. Vitreoretinal Surgeon [facility]

## 2023-11-14 ENCOUNTER — Encounter (HOSPITAL_COMMUNITY): Payer: Self-pay | Admitting: Ophthalmology

## 2023-11-14 ENCOUNTER — Other Ambulatory Visit: Payer: Self-pay

## 2023-11-14 NOTE — Anesthesia Preprocedure Evaluation (Signed)
 Anesthesia Evaluation  Patient identified by MRN, date of birth, ID band Patient awake    Reviewed: Allergy & Precautions, NPO status , Patient's Chart, lab work & pertinent test results, reviewed documented beta blocker date and time   History of Anesthesia Complications (+) PONV and history of anesthetic complications  Airway Mallampati: II  TM Distance: >3 FB Neck ROM: Full    Dental  (+) Teeth Intact, Dental Advisory Given   Pulmonary sleep apnea and Continuous Positive Airway Pressure Ventilation , Current SmokerPatient did not abstain from smoking.   Pulmonary exam normal breath sounds clear to auscultation       Cardiovascular hypertension, Pt. on medications and Pt. on home beta blockers + CAD, + Peripheral Vascular Disease and + DVT  Normal cardiovascular exam Rhythm:Regular Rate:Normal     Neuro/Psych Seizures -, Well Controlled,  PSYCHIATRIC DISORDERS Anxiety      Neuromuscular disease CVA    GI/Hepatic Neg liver ROS,GERD  Medicated,,  Endo/Other  diabetes, Type 2, Oral Hypoglycemic Agents    Renal/GU Renal InsufficiencyRenal disease     Musculoskeletal  (+) Arthritis ,    Abdominal   Peds  Hematology negative hematology ROS (+)   Anesthesia Other Findings Day of surgery medications reviewed with the patient.  Reproductive/Obstetrics                             Anesthesia Physical Anesthesia Plan  ASA: 3  Anesthesia Plan: General   Post-op Pain Management: Tylenol  PO (pre-op)*   Induction: Intravenous  PONV Risk Score and Plan: 3 and Midazolam , Dexamethasone , Ondansetron  and TIVA  Airway Management Planned: Oral ETT  Additional Equipment:   Intra-op Plan:   Post-operative Plan: Extubation in OR  Informed Consent: I have reviewed the patients History and Physical, chart, labs and discussed the procedure including the risks, benefits and alternatives for the proposed  anesthesia with the patient or authorized representative who has indicated his/her understanding and acceptance.     Dental advisory given  Plan Discussed with: CRNA  Anesthesia Plan Comments: (PAT note written 11/14/2023 by Genaro Bekker, PA-C. History of difficult needle stick.   )       Anesthesia Quick Evaluation

## 2023-11-14 NOTE — Progress Notes (Addendum)
 PCP - Wilburn Handler, MD  Cardiologist - Branch, Joyceann No, MD   PPM/ICD - denies Device Orders - n/a Rep Notified - n/a  Chest x-ray -  EKG - 10-09-23 Stress Test - 08-2014 ECHO - 09-12-22 Cardiac Cath - 09-28-14  CPAP - uses nightly  GLP-1 -OZEMPIC per patient last dose 11-03-23  Fasting Blood Sugar - per patient last check this week was 120 Checks Blood Sugar per patient she checks blood sugar a couple times a week  Blood Thinner Instructions: clopidogrel  (PLAVIX ) continue Aspirin  Instructions: aspirin  continue Did speak with nurse at Dr. Karyl Paget office regarding plavix  and asa did not need to hold medicaiton ERAS Protcol - clear liquids until 8:30 AM.  COVID TEST- n/a  Anesthesia review: yes Hx, of DM, PAD, OSA, stroke, and seizure  Patient verbally denies any shortness of breath, fever, cough and chest pain during phone call   -------------  SDW INSTRUCTIONS given:  Your procedure is scheduled on November 15, 2023.  Report to [facility] Main Entrance "A" at 9:00 A.M., and check in at the Admitting office.  Call this number if you have problems the morning of surgery:  [phone]   Remember:  Do not eat after midnight the night before your surgery  You may drink clear liquids until 8:30 the morning of your surgery.   Clear liquids allowed are: Water , Non-Citrus Juices (without pulp), Carbonated Beverages, Clear Tea, Black Coffee Only, and Gatorade    Take these medicines the morning of surgery with A SIP OF WATER  acetaminophen  (TYLENOL )  diazePAM,  ezetimibe  (ZETIA )  felodipine  (PLENDIL )  gabapentin  (NEURONTIN )  lamoTRIgine  (LAMICTAL )  levETIRAcetam  (KEPPRA )  LORazepam  (ATIVAN )  metoprolol  succinate (TOPROL -XL)  pantoprazole  (PROTONIX )     As of today, STOP taking any Aspirin  (unless otherwise instructed by your surgeon) Aleve, Naproxen, Ibuprofen , Motrin , Advil , Goody's, BC's, all herbal medications, fish oil, and all vitamins.          WHAT DO I DO ABOUT MY  DIABETES MEDICATION?   Do not take oral diabetes medicines metFORMIN  (GLUCOPHAGE ), pioglitazone  (ACTOS )  (pills) the morning of surgery.    The day of surgery, do not take other diabetes injectables, including Byetta (exenatide), Bydureon (exenatide ER), Victoza (liraglutide), or Trulicity (dulaglutide).  If your CBG is greater than 220 mg/dL, you may take  of your sliding scale (correction) dose of insulin .   HOW TO MANAGE YOUR DIABETES BEFORE AND AFTER SURGERY  Why is it important to control my blood sugar before and after surgery? Improving blood sugar levels before and after surgery helps healing and can limit problems. A way of improving blood sugar control is eating a healthy diet by:  Eating less sugar and carbohydrates  Increasing activity/exercise  Talking with your doctor about reaching your blood sugar goals High blood sugars (greater than 180 mg/dL) can raise your risk of infections and slow your recovery, so you will need to focus on controlling your diabetes during the weeks before surgery. Make sure that the doctor who takes care of your diabetes knows about your planned surgery including the date and location.  How do I manage my blood sugar before surgery? Check your blood sugar at least 4 times a day, starting 2 days before surgery, to make sure that the level is not too high or low.  Check your blood sugar the morning of your surgery when you wake up and every 2 hours until you get to the Short Stay unit.  If your blood sugar is less  than 70 mg/dL, you will need to treat for low blood sugar: Do not take insulin . Treat a low blood sugar (less than 70 mg/dL) with  cup of clear juice (cranberry or apple), 4 glucose tablets, OR glucose gel. Recheck blood sugar in 15 minutes after treatment (to make sure it is greater than 70 mg/dL). If your blood sugar is not greater than 70 mg/dL on recheck, call [phone] for further instructions. Report your blood sugar to the  short stay nurse when you get to Short Stay.  If you are admitted to the hospital after surgery: Your blood sugar will be checked by the staff and you will probably be given insulin  after surgery (instead of oral diabetes medicines) to make sure you have good blood sugar levels. The goal for blood sugar control after surgery is 80-180 mg/dL.             Do not wear jewelry, make up, or nail polish            Do not wear lotions, powders, perfumes/colognes, or deodorant.            Do not shave 48 hours prior to surgery.  Men may shave face and neck.            Do not bring valuables to the hospital.            [facility] is not responsible for any belongings or valuables.  Do NOT Smoke (Tobacco/Vaping) 24 hours prior to your procedure If you use a CPAP at night, you may bring all equipment for your overnight stay.   Contacts, glasses, dentures or bridgework may not be worn into surgery.      For patients admitted to the hospital, discharge time will be determined by your treatment team.   Patients discharged the day of surgery will not be allowed to drive home, and someone needs to stay with them for 24 hours.    Special instructions:   [place]- Preparing For Surgery  Before surgery, you can play an important role. Because skin is not sterile, your skin needs to be as free of germs as possible. You can reduce the number of germs on your skin by washing with CHG (chlorahexidine gluconate) Soap before surgery.  CHG is an antiseptic cleaner which kills germs and bonds with the skin to continue killing germs even after washing.    Oral Hygiene is also important to reduce your risk of infection.  Remember - BRUSH YOUR TEETH THE MORNING OF SURGERY WITH YOUR REGULAR TOOTHPASTE  Please do not use if you have an allergy to CHG or antibacterial soaps. If your skin becomes reddened/irritated stop using the CHG.  Do not shave (including legs and underarms) for at least 48 hours prior to first  CHG shower. It is OK to shave your face.  Please follow these instructions carefully.   Shower the NIGHT BEFORE SURGERY and the MORNING OF SURGERY with DIAL Soap.   Pat yourself dry with a CLEAN TOWEL.  Wear CLEAN PAJAMAS to bed the night before surgery  Place CLEAN SHEETS on your bed the night of your first shower and DO NOT SLEEP WITH PETS.   Day of Surgery: Please shower morning of surgery  Wear Clean/Comfortable clothing the morning of surgery Do not apply any deodorants/lotions.   Remember to brush your teeth WITH YOUR REGULAR TOOTHPASTE.   Questions were answered. Patient verbalized understanding of instructions.

## 2023-11-14 NOTE — Progress Notes (Signed)
 Anesthesia Chart Review: SAME DAY WORK-UP  Case: [id number] Date/Time: 11/15/23 1115   Procedure: PHOTOCOAGULATION, EYE, USING LASER (Bilateral)   Anesthesia type: General   Diagnosis: Proliferative diabetic retinopathy of both eyes with macular edema associated with type 2 diabetes mellitus (HCC) [J47.8295]   Pre-op diagnosis: Proliferative diabetic retinopathy, both eyes   [facility] ROOM 08 / [facility]   Surgeons: [name], MD       DISCUSSION: Patient is a 58 year old female scheduled for the above procedure.    History includes smoking, post-operative N/V, HTN, hypercholesterolemia, murmur (no significant valvular disease/trivial TR 11/2019 echo), GERD, anxiety, RLE DVT (RLE ~2009/2010?), DM2, CKD, nephrolithiasis (last procedure 02/15/22: removal right ureteral stent, left lithotripsy, left ureteral stent), seizures (04/2011), OSA (uses CPAP), CVA (2009, 2014), PAD (s/p PTA/stenting right SFA & right CIA 08/17/08), anemia, neuropathy, obesity (s/p laparoscopic gastric sleeve resection 04/10/16), uterine fibroids with menorrhagia (uterine ablation 07/11/04; uterine artery ablation 12/12/12), carotid artery stenosis (balloon angioplasty of supraclinoid left ICA 10/14/14 with known occluded right ICA distal to ophthalmic artery), anal fistula (s/p I&D perianal abscess, placement of draining seton 02/23/23; ligation of intersphincteric fistulous tract 05/24/23).   Last visit with cardiologist Dr. Armida Lander was on 10/09/23. Occasional positional dizziness but no recurrent syncope after decreasing Lasix  to 20 mg daily every other day for + orthostatic hypotension. Euvolemic on exam. Known mild-moderate CAD in March 2016 following false positive stress test. No chest pain. She is on DPAT for carotid artery stenosis per IR. Six month follow-up planned. He did refer her to neurology after she reported an episodes while driving she she became disoriented.   Evaluation by neurologist Dr. Samara Crest on  10/08/23. He notes first seizure post CVA in 2012. She has been on lamotrigine  and Keppra  and seizure free for over 10 years, but was concerned after she had a period of disorientation while driving and pulled over. This was similar presentation with prior seizure, although reportedly her grand kids were with her in the car and did not witness any seizure activity. He increased her medication and ordered an EEG which was normal. He also provider an rescue medicaiton, [employer], if needed during an upcoming cruise. Six month follow-up planned. She did ready out to Dr. Samara Crest to inquire if okay to proceed with eye surgery. On 10/19/23 he replied, "Hey EEG was normal. It should be ok for her to have laser eye surgery." Of note her last carotid angiography was done on 09/18/23 with Dr. Alvira Josephs recommending 6 month follow-up.  Last Ozempic 11/03/23. PAT RN confirmed with Dr. Ardis Krebs office that she did not need to hold Plavix  and ASA for this procedure.  DIFFICULT IV stick--she has required foot draws in the past and IV team for PIV access.   She is a same day work-up. Anesthesia team to evaluate on the day of procedure.    VS: Ht 5\' 6"  (1.676 m)   Wt 102.1 kg   LMP 12/10/2014 Comment: pt states that there is no chance or pregnancy  BMI 36.33 kg/m  BP Readings from Last 3 Encounters:  10/18/23 124/63  10/09/23 130/64  10/05/23 (!) 146/82   Pulse Readings from Last 3 Encounters:  10/18/23 71  10/09/23 71  09/27/23 76     PROVIDERS: Wilburn Handler, MD is listed as PCP  Armida Lander, MD is cardiologist    Celene Coins, MD is pulmonologist (OSA) Myrlene Asper, MD is GI Trent Frizzle, MD/Herrick, Amye Baller, MD is urologist(s) Beatris Lincoln, MD is general  surgeon Deveshwar, Sanjeev, MD is IR Cassandra Cleveland, MD is neurologist   LABS: For day of surgery as indicated. Last results in [facility] include: Lab Results  Component Value Date   WBC 9.5 09/18/2023   HGB 13.0 09/18/2023   HCT  39.1 09/18/2023   PLT 194 09/18/2023   GLUCOSE 128 (H) 10/19/2023   NA 144 10/19/2023   K 4.2 10/19/2023   CL 106 10/19/2023   CREATININE 1.16 (H) 10/19/2023   BUN 18 10/19/2023   CO2 23 10/19/2023   INR 0.9 09/18/2023    OTHER: EEG 10/19/23: IMPRESSION: This is a normal awake EEG. No evidence of interictal epileptiform discharges. Normal EEGs, however, do not rule out epilepsy.   Split Night Sleep Study 09/20/22: IMPRESSIONS - Mild obstructive sleep apnea occurred during this study (AHI = 6.7/h), especially during supine REM sleep - No significant central sleep apnea occurred during this study (CAI = 1.2/h). - Mild oxygen desaturation was noted during this study (Min O2 = 85.00%). - The patient snored with moderate snoring volume. - No cardiac abnormalities were noted during this study. - Clinically significant periodic limb movements did not occur during sleep. No significant associated arousals.   EGD 12/27/21: IMPRESSION: - Normal esophagus. - Erosive gastropathy with no stigmata of recent bleeding. - Gastritis. Biopsied. Pathology: Gastric antral mucosa with nonspecific reactive gastropathy. Gastric oxyntic mucosa with no specific histopathologic changes. Helicobacter pylori-like organisms are not identified on routine HE stain.  - A sleeve gastrectomy was found, characterized by healthy appearing mucosa. - Nodular mucosa in the duodenal bulb (localized in two focal areas). Biopsied. 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 - Submucosal mass in the proximal ascending colon, possibly external compression. Biopsied. Pathology: Benign colonic mucosa with no diagnostic abnormality. Negative for submucosa. - The entire examined colon is normal. Biopsied. Pathology: Benign colonic mucosa with no diagnostic abnormality.  Recommendation: Repeat colonoscopy in 3 years for screening  purposes given prep quality.     IMAGES: MRI L-spine 10/16/23: IMPRESSION: 1. L3-4: Bilateral facet degeneration and hypertrophy with 2 mm of degenerative anterolisthesis. Mild bulging of the disc. Prominent epidural fat. Mild multifactorial stenosis that could be symptomatic. The facet arthropathy would also likely be painful. 2. L4-5: Bilateral facet arthropathy with gaping, fluid-filled joints. Anterolisthesis of 2 mm that would likely worsen with standing or flexion. Shallow protrusion of the disc. Moderate multifactorial stenosis that would likely worsen with standing or flexion and could cause neural compression on either or both sides. The facet arthritis would also likely be painful.  Carotid/Innominate Angiography 09/18/23: IMPRESSION: - Occluded left internal carotid artery at the level of the ophthalmic artery. - Approximately 50% stenosis of the supraclinoid left ICA stable. - Wide patency of both posterior cerebral arteries. - The right middle cerebral artery and the right anterior cerebral artery territories opacify via the anterior communicating artery from the left internal carotid artery, and from the leptomeningeal collaterals arising from P2 and P3 segments of the right posterior cerebral artery. PLAN: Findings reviewed with the patient. Follow-up CT angiogram of the head and neck in six-months time.   MRA Head 09/11/23: IMPRESSION: 1. Similar thread-like antegrade flow signal in the course of the right ICA from the upper neck to the level of the ophthalmic artery. Little to no reconstitution of the right M1 MCA vessel. Similar patent but asymmetrically diminished right distal MCA vessels. 2. Severe stenosis or occlusion of the supraclinoid ICA  which is likely progressed given poor flow related signal in the proximal left A1 ACA and proximal left M1 MCA. There is preservation of flow related signal in the more distal A1 ACA and left M1 MCA. More distal left  MCA vessels remain opacified. 3. Potentially severe left P2 PCA stenosis proximally although artifact could be contributing. A CTA could further evaluate if clinically warranted.  CT Chest LCS 07/05/23: IMPRESSION: 1. Lung-RADS 1, negative. Continue annual screening with low-dose chest CT without contrast in 12 months. 2. Left nephrolithiasis. 3. Aortic Atherosclerosis (ICD10-I70.0) and Emphysema (ICD10-J43.9). 4. Age advanced coronary artery atherosclerosis. Recommend assessment of coronary risk factors.    EKG: 10/09/23: Normal sinus rhythm Low voltage QRS - Baseline wanderer/artifact in limb leads II, III     CV: Echo 09/12/22: IMPRESSIONS   1. Left ventricular ejection fraction, by estimation, is 65 to 70%. The  left ventricle has normal function. The left ventricle has no regional  wall motion abnormalities. Left ventricular diastolic parameters are  consistent with Grade I diastolic  dysfunction (impaired relaxation). The average left ventricular global  longitudinal strain is -22.8 %. The global longitudinal strain is normal.   2. Right ventricular systolic function is normal. The right ventricular  size is normal. Tricuspid regurgitation signal is inadequate for assessing  PA pressure.   3. The mitral valve is normal in structure. Trivial mitral valve  regurgitation. No evidence of mitral stenosis.   4. The aortic valve is tricuspid. There is mild calcification of the  aortic valve. There is mild thickening of the aortic valve. Aortic valve  regurgitation is not visualized. No aortic stenosis is present.  - Comparison(s): Echocardiogram done 12/09/19 showed an EF of 60-65%.     30 day monitor 08/07/22:   Min HR 54, max HR 140, Avg HR 71   Reported symptoms correlate with sinus rhythm and sinus tachycardia   No significant arrhythmias   Cardiac cath 09/28/14 (for high risk 2 day nuclear stress test, large area of anteroseptal ischemia, EF 60%  09/16/14) IMPRESSIONS: Normal left main coronary artery. Mild disease in the left anterior descending artery and its branches. Mild disease in the dominant left circumflex artery and its branches. Moderate disease in the nondominant right coronary artery. Left ventricular systolic function not assessed.  LVEDP 18 mmHg.   6.   False positive nuclear stress test.   RECOMMENDATION:  Continue aggressive preventive therapy given her history of peripheral vascular disease and multiple risk factors for coronary atherosclerosis.     US  Carotid 09/11/14: IMPRESSION: - No significant atherosclerotic disease of the left or right carotid systems, with no significant stenosis at the left or right carotid bifurcation by SRU duplex criteria. - High resistant waveform of the right ICA is suggestive of intracranial stenosis or possibly occlusion. A stenosis was previously demonstrated on the angiogram dated 05/27/2008, potentially having progressed. Repeat formal angiogram or CT angiogram may be useful. - High diastolic flow of the right vertebral artery is suggestive of flow compensation for the probable right intracranial ICA stenosis/occlusion.    Past Medical History:  Diagnosis Date   Anemia    Anxiety    Arthritis    Back pain    complains when laying on hard flat surface   Chronic kidney disease    appt. /w urology 11/10/2014- for a cyst seen on US . evaluated was told it was nothing.   Diabetes mellitus    Diabetes II, oral and GLP-1.   DVT (deep venous thrombosis) (HCC)  right leg '09 or '10   Fibroids    GERD (gastroesophageal reflux disease)    Heart murmur    History of kidney stones    History of stress test    referred for cardiac cath- done here at [facility]- 09/2014   Hypercholesterolemia    Hypertension    Neuromuscular disorder (HCC)    neuropathy   PONV (postoperative nausea and vomiting)    Seizures (HCC)    02/01/23- last seizure approximately 2015;   Sleep apnea     using cpap as of 05/23/23   Stroke [facility])    '09-had blockage in brain, too deep to operate"occ left lower extremity fatique, occ. 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    Past Surgical History:  Procedure Laterality Date   APPENDECTOMY     BIOPSY  11/18/2021   Procedure: BIOPSY;  Surgeon: Urban Garden, MD;  [facility];  Service: Gastroenterology;;  random colon;   BIOPSY  12/27/2021   Procedure: BIOPSY;  Surgeon: Urban Garden, MD;  [facility];  Service: Gastroenterology;;   CARDIAC CATHETERIZATION     CESAREAN SECTION  1986 & 1988   x2   CHOLECYSTECTOMY     open"gallstones"   COLONOSCOPY WITH PROPOFOL  N/A 11/18/2021   Procedure: COLONOSCOPY WITH PROPOFOL ;  Surgeon: Urban Garden, MD;  [facility];  Service: Gastroenterology;  Laterality: N/A;  1215   DILATATION & CURETTAGE/HYSTEROSCOPY WITH MYOSURE N/A 05/10/2017   Procedure: DILATATION & CURETTAGE/HYSTEROSCOPY WITH MYOSURE;  Surgeon: Ivery Marking, MD;  Location: [facility];  Service: Gynecology;  Laterality: N/A;   ESOPHAGOGASTRODUODENOSCOPY (EGD) WITH PROPOFOL  N/A 12/27/2021   Procedure: ESOPHAGOGASTRODUODENOSCOPY (EGD) WITH PROPOFOL ;  Surgeon: Urban Garden, MD;  [facility];  Service: Gastroenterology;  Laterality: N/A;  100   ESOPHAGOGASTRODUODENOSCOPY (EGD) WITH PROPOFOL  N/A 01/13/2022   Procedure: ESOPHAGOGASTRODUODENOSCOPY (EGD) WITH PROPOFOL ;  Surgeon: Urban Garden, MD;  [facility];  Service: Gastroenterology;  Laterality: N/A;  235, per Hugo Maes pt knows to arrive at 6:15   HEMOSTASIS CLIP PLACEMENT  01/13/2022   Procedure: HEMOSTASIS CLIP PLACEMENT;  Surgeon: Urban Garden, MD;  [facility];  Service: Gastroenterology;;   INCISION AND DRAINAGE ABSCESS  02/23/2023   Procedure: INCISION AND DRAINAGE OF  PERI RECTAL ABSCESS;  Surgeon: Melvenia Stabs, MD;  Location: [facility];  Service: General;;   INTERVENTIONAL RADIOLOGY PROCEDURE     CT angiography /neck -Dr. Alvira Josephs.   IR ANGIO INTRA EXTRACRAN SEL COM CAROTID INNOMINATE BILAT MOD SED  02/22/2018   IR ANGIO INTRA EXTRACRAN SEL COM CAROTID INNOMINATE BILAT MOD SED  03/08/2022   IR ANGIO INTRA EXTRACRAN SEL COM CAROTID INNOMINATE BILAT MOD SED  09/18/2023   IR ANGIO VERTEBRAL SEL SUBCLAVIAN INNOMINATE UNI L MOD SED  09/18/2023   IR ANGIO VERTEBRAL SEL VERTEBRAL BILAT MOD SED  02/22/2018   IR ANGIO VERTEBRAL SEL VERTEBRAL UNI R MOD SED  03/08/2022   IR ANGIO VERTEBRAL SEL VERTEBRAL UNI R MOD SED  09/18/2023   IR GENERIC HISTORICAL  08/03/2016   IR RADIOLOGIST EVAL & MGMT 08/03/2016 [facility]   IR RADIOLOGIST EVAL & MGMT  01/03/2022   LAPAROSCOPIC GASTRIC SLEEVE RESECTION N/A 04/10/2016   Procedure: LAPAROSCOPIC GASTRIC SLEEVE RESECTION WITH UPPER ENDO;  Surgeon: Aldean Hummingbird, MD;  Location: [facility];  Service: General;  Laterality: N/A;  LEFT HEART CATHETERIZATION WITH CORONARY ANGIOGRAM N/A 09/28/2014   Procedure: LEFT HEART CATHETERIZATION WITH CORONARY ANGIOGRAM;  Surgeon: Lucendia Rusk, MD;  Location: [facility] CATH LAB;  Service: Cardiovascular;  Laterality: N/A;   LIGATION OF INTERNAL FISTULA TRACT N/A 05/24/2023   Procedure: LIGATION OF INTERSPHINCTERIC FISTULOUS TRACT;  Surgeon: Melvenia Stabs, MD;  Location: [facility];  Service: General;  Laterality: N/A;  90   NEPHROLITHOTOMY Right 02/01/2022   Procedure: RIGHT NEPHROLITHOTOMY PERCUTANEOUS WITH SURGEON ACCESS;  Surgeon: Andrez Banker, MD;  Location: [facility];  Service: Urology;  Laterality: Right;   NEPHROLITHOTOMY Left 02/15/2022   Procedure: LEFT NEPHROLITHOTOMY PERCUTANEOUS WITH SURGEON ACCESS, REMOVAL OF RIGHT URETERAL STENT;  Surgeon: Andrez Banker, MD;  Location: [facility];  Service: Urology;  Laterality: Left;  195 MINUTES   PLACEMENT OF  SETON N/A 02/23/2023   Procedure: SURGICAL TREATMENT OF ANAL FISTULA-TRANSSPHINTERIL WITH PLACEMENT OF SETON;  Surgeon: Melvenia Stabs, MD;  Location: [facility];  Service: General;  Laterality: N/A;   POLYPECTOMY  01/13/2022   Procedure: POLYPECTOMY;  Surgeon: Urban Garden, MD;  [facility];  Service: Gastroenterology;;   RADIOLOGY WITH ANESTHESIA N/A 10/14/2014   Procedure: Neita Balo;  Surgeon: Luellen Sages, MD;  Location[facility];  Service: Radiology;  Laterality: N/A;   RECTAL EXAM UNDER ANESTHESIA N/A 05/24/2023   Procedure: ANORECTAL EXAM UNDER ANESTHESIA;  Surgeon: Melvenia Stabs, MD;  Location: [facility];  Service: General;  Laterality: N/A;   TUBAL LIGATION      MEDICATIONS: No current facility-administered medications for this encounter.    acetaminophen  (TYLENOL ) 500 MG tablet   aspirin  EC 81 MG tablet   Calcium -Phosphorus-Vitamin D (CITRACAL +D3 PO)   clopidogrel  (PLAVIX ) 75 MG tablet   Cyanocobalamin (VITAMIN B-12) 5000 MCG SUBL   docusate sodium  (COLACE) 100 MG capsule   ezetimibe  (ZETIA ) 10 MG tablet   felodipine  (PLENDIL ) 5 MG 24 hr tablet   furosemide  (LASIX ) 20 MG tablet   gabapentin  (NEURONTIN ) 300 MG capsule   lamoTRIgine  (LAMICTAL ) 100 MG tablet   levETIRAcetam  (KEPPRA ) 500 MG tablet   linaclotide (LINZESS) 145 MCG CAPS capsule   LORazepam  (ATIVAN ) 0.5 MG tablet   metFORMIN  (GLUCOPHAGE ) 500 MG tablet   metoprolol  succinate (TOPROL -XL) 25 MG 24 hr tablet   Multiple Vitamins-Minerals (BARIATRIC MULTIVITAMINS/IRON PO)   olmesartan (BENICAR) 40 MG tablet   OZEMPIC, 1 MG/DOSE, 2 MG/1.5ML SOPN   pantoprazole  (PROTONIX ) 40 MG tablet   pioglitazone  (ACTOS ) 30 MG tablet   potassium chloride  (KLOR-CON  M) 10 MEQ tablet   rosuvastatin  (CRESTOR ) 40 MG tablet   brompheniramine-pseudoephedrine-DM 30-2-10 MG/5ML syrup   diazePAM, 20 MG Dose, (VALTOCO 20 MG DOSE) 2 x 10 MG/0.1ML LQPK    Ella Gun, PA-C Surgical Short Stay/Anesthesiology [facility] Phone [phone] [facility] Phone [phone] 11/14/2023 11:19 AM

## 2023-11-15 ENCOUNTER — Encounter (HOSPITAL_COMMUNITY): Payer: Self-pay | Admitting: Ophthalmology

## 2023-11-15 ENCOUNTER — Ambulatory Visit (HOSPITAL_COMMUNITY): Payer: Self-pay | Admitting: Vascular Surgery

## 2023-11-15 ENCOUNTER — Ambulatory Visit (HOSPITAL_COMMUNITY)
Admission: RE | Admit: 2023-11-15 | Discharge: 2023-11-15 | Disposition: A | Discharge status: Home / Self Care | Attending: Ophthalmology | Admitting: Ophthalmology

## 2023-11-15 ENCOUNTER — Encounter (HOSPITAL_COMMUNITY)
Admission: RE | Disposition: A | Discharge status: Home / Self Care | Payer: Self-pay | Source: Home / Self Care | Attending: Ophthalmology

## 2023-11-15 DIAGNOSIS — I129 Hypertensive chronic kidney disease with stage 1 through stage 4 chronic kidney disease, or unspecified chronic kidney disease: Secondary | ICD-10-CM | POA: Insufficient documentation

## 2023-11-15 DIAGNOSIS — E113513 Type 2 diabetes mellitus with proliferative diabetic retinopathy with macular edema, bilateral: Secondary | ICD-10-CM | POA: Diagnosis present

## 2023-11-15 DIAGNOSIS — E113593 Type 2 diabetes mellitus with proliferative diabetic retinopathy without macular edema, bilateral: Secondary | ICD-10-CM | POA: Insufficient documentation

## 2023-11-15 DIAGNOSIS — Z79899 Other long term (current) drug therapy: Secondary | ICD-10-CM | POA: Diagnosis not present

## 2023-11-15 DIAGNOSIS — F419 Anxiety disorder, unspecified: Secondary | ICD-10-CM | POA: Diagnosis not present

## 2023-11-15 DIAGNOSIS — Z7902 Long term (current) use of antithrombotics/antiplatelets: Secondary | ICD-10-CM | POA: Insufficient documentation

## 2023-11-15 DIAGNOSIS — Z8673 Personal history of transient ischemic attack (TIA), and cerebral infarction without residual deficits: Secondary | ICD-10-CM | POA: Insufficient documentation

## 2023-11-15 DIAGNOSIS — E7800 Pure hypercholesterolemia, unspecified: Secondary | ICD-10-CM | POA: Insufficient documentation

## 2023-11-15 DIAGNOSIS — I2510 Atherosclerotic heart disease of native coronary artery without angina pectoris: Secondary | ICD-10-CM | POA: Insufficient documentation

## 2023-11-15 DIAGNOSIS — Z7984 Long term (current) use of oral hypoglycemic drugs: Secondary | ICD-10-CM | POA: Insufficient documentation

## 2023-11-15 DIAGNOSIS — G709 Myoneural disorder, unspecified: Secondary | ICD-10-CM | POA: Insufficient documentation

## 2023-11-15 DIAGNOSIS — Z955 Presence of coronary angioplasty implant and graft: Secondary | ICD-10-CM | POA: Insufficient documentation

## 2023-11-15 DIAGNOSIS — Z86718 Personal history of other venous thrombosis and embolism: Secondary | ICD-10-CM | POA: Diagnosis not present

## 2023-11-15 DIAGNOSIS — K219 Gastro-esophageal reflux disease without esophagitis: Secondary | ICD-10-CM | POA: Diagnosis not present

## 2023-11-15 DIAGNOSIS — E1151 Type 2 diabetes mellitus with diabetic peripheral angiopathy without gangrene: Secondary | ICD-10-CM | POA: Diagnosis not present

## 2023-11-15 DIAGNOSIS — G40909 Epilepsy, unspecified, not intractable, without status epilepticus: Secondary | ICD-10-CM | POA: Diagnosis not present

## 2023-11-15 DIAGNOSIS — E1122 Type 2 diabetes mellitus with diabetic chronic kidney disease: Secondary | ICD-10-CM | POA: Insufficient documentation

## 2023-11-15 DIAGNOSIS — G4733 Obstructive sleep apnea (adult) (pediatric): Secondary | ICD-10-CM | POA: Diagnosis not present

## 2023-11-15 DIAGNOSIS — M1990 Unspecified osteoarthritis, unspecified site: Secondary | ICD-10-CM | POA: Insufficient documentation

## 2023-11-15 DIAGNOSIS — F17210 Nicotine dependence, cigarettes, uncomplicated: Secondary | ICD-10-CM | POA: Insufficient documentation

## 2023-11-15 DIAGNOSIS — N189 Chronic kidney disease, unspecified: Secondary | ICD-10-CM | POA: Insufficient documentation

## 2023-11-15 DIAGNOSIS — Z9884 Bariatric surgery status: Secondary | ICD-10-CM | POA: Insufficient documentation

## 2023-11-15 HISTORY — PX: PHOTOCOAGULATION WITH LASER: SHX6027

## 2023-11-15 HISTORY — DX: Unspecified osteoarthritis, unspecified site: M19.90

## 2023-11-15 LAB — CBC
HCT: 40.2 % (ref 36.0–46.0)
Hemoglobin: 13.1 g/dL (ref 12.0–15.0)
MCH: 31.5 pg (ref 26.0–34.0)
MCHC: 32.6 g/dL (ref 30.0–36.0)
MCV: 96.6 fL (ref 80.0–100.0)
Platelets: 187 10*3/uL (ref 150–400)
RBC: 4.16 MIL/uL (ref 3.87–5.11)
RDW: 11.8 % (ref 11.5–15.5)
WBC: 7.5 10*3/uL (ref 4.0–10.5)
nRBC: 0 % (ref 0.0–0.2)

## 2023-11-15 LAB — BASIC METABOLIC PANEL WITH GFR
Anion gap: 10 (ref 5–15)
BUN: 16 mg/dL (ref 6–20)
CO2: 21 mmol/L — ABNORMAL LOW (ref 22–32)
Calcium: 10.4 mg/dL — ABNORMAL HIGH (ref 8.9–10.3)
Chloride: 110 mmol/L (ref 98–111)
Creatinine, Ser: 0.89 mg/dL (ref 0.44–1.00)
GFR, Estimated: >60 mL/min (ref >60)
Glucose, Bld: 121 mg/dL — ABNORMAL HIGH (ref 70–99)
Potassium: 4.1 mmol/L (ref 3.5–5.1)
Sodium: 141 mmol/L (ref 135–145)

## 2023-11-15 LAB — GLUCOSE, CAPILLARY
Glucose-Capillary: 118 mg/dL — ABNORMAL HIGH (ref 70–99)
Glucose-Capillary: 117 mg/dL — ABNORMAL HIGH (ref 70–99)

## 2023-11-15 SURGERY — PHOTOCOAGULATION, EYE, USING LASER
Anesthesia: General | Site: Eye | Laterality: Bilateral

## 2023-11-15 MED ORDER — MIDAZOLAM HCL 2 MG/2ML IJ SOLN
INTRAMUSCULAR | Status: DC | PRN
Start: 2023-11-15 — End: 2023-11-15
  Administered 2023-11-15: 2 mg via INTRAVENOUS

## 2023-11-15 MED ORDER — DEXAMETHASONE SODIUM PHOSPHATE 10 MG/ML IJ SOLN
INTRAMUSCULAR | Status: AC
  Filled 2023-11-15: qty 1

## 2023-11-15 MED ORDER — PHENYLEPHRINE 80 MCG/ML (10ML) SYRINGE FOR IV PUSH (FOR BLOOD PRESSURE SUPPORT)
PREFILLED_SYRINGE | INTRAVENOUS | Status: DC | PRN
  Administered 2023-11-15: 80 ug via INTRAVENOUS

## 2023-11-15 MED ORDER — MIDAZOLAM HCL 2 MG/2ML IJ SOLN
INTRAMUSCULAR | Status: AC
  Filled 2023-11-15: qty 2

## 2023-11-15 MED ORDER — INSULIN ASPART 100 UNIT/ML IJ SOLN: 0.0000 [IU] | INTRAMUSCULAR | Status: DC | PRN

## 2023-11-15 MED ORDER — ATROPINE SULFATE 1 % OP SOLN
1.0000 [drp] | OPHTHALMIC | Status: AC | PRN
  Administered 2023-11-15 (×3): 1 [drp] via OPHTHALMIC
  Filled 2023-11-15: qty 2

## 2023-11-15 MED ORDER — EPHEDRINE SULFATE-NACL 50-0.9 MG/10ML-% IV SOSY
PREFILLED_SYRINGE | INTRAVENOUS | Status: DC | PRN
  Administered 2023-11-15: 5 mg via INTRAVENOUS

## 2023-11-15 MED ORDER — PROPARACAINE HCL 0.5 % OP SOLN
1.0000 [drp] | OPHTHALMIC | Status: AC | PRN
  Administered 2023-11-15 (×3): 1 [drp] via OPHTHALMIC
  Filled 2023-11-15: qty 15

## 2023-11-15 MED ORDER — GATIFLOXACIN 0.5 % OP SOLN
OPHTHALMIC | Status: DC | PRN
  Administered 2023-11-15: 1 [drp] via OPHTHALMIC

## 2023-11-15 MED ORDER — BSS IO SOLN
INTRAOCULAR | Status: DC | PRN
  Administered 2023-11-15: 15 mL via INTRAOCULAR

## 2023-11-15 MED ORDER — SODIUM CHLORIDE 0.9 % IV SOLN: INTRAVENOUS | Status: DC | PRN

## 2023-11-15 MED ORDER — SODIUM CHLORIDE 0.9% FLUSH: 3.0000 mL | INTRAVENOUS | Status: DC | PRN

## 2023-11-15 MED ORDER — STERILE WATER FOR INJECTION IJ SOLN
INTRAMUSCULAR | Status: DC | PRN
  Administered 2023-11-15: 250 mL

## 2023-11-15 MED ORDER — BSS IO SOLN
INTRAOCULAR | Status: AC
  Filled 2023-11-15: qty 15

## 2023-11-15 MED ORDER — ROCURONIUM BROMIDE 10 MG/ML (PF) SYRINGE
PREFILLED_SYRINGE | INTRAVENOUS | Status: DC | PRN
Start: 2023-11-15 — End: 2023-11-15
  Administered 2023-11-15: 50 mg via INTRAVENOUS
  Administered 2023-11-15: 20 mg via INTRAVENOUS

## 2023-11-15 MED ORDER — SUGAMMADEX SODIUM 200 MG/2ML IV SOLN
INTRAVENOUS | Status: DC | PRN
  Administered 2023-11-15: 200 mg via INTRAVENOUS

## 2023-11-15 MED ORDER — PROPOFOL 10 MG/ML IV BOLUS
INTRAVENOUS | Status: AC
  Filled 2023-11-15: qty 20

## 2023-11-15 MED ORDER — TROPICAMIDE 1 % OP SOLN
1.0000 [drp] | OPHTHALMIC | Status: AC | PRN
  Administered 2023-11-15 (×3): 1 [drp] via OPHTHALMIC
  Filled 2023-11-15: qty 15

## 2023-11-15 MED ORDER — ORAL CARE MOUTH RINSE: 15.0000 mL | Freq: Once | OROMUCOSAL | Status: AC

## 2023-11-15 MED ORDER — LIDOCAINE 2% (20 MG/ML) 5 ML SYRINGE
INTRAMUSCULAR | Status: DC | PRN
  Administered 2023-11-15: 100 mg via INTRAVENOUS

## 2023-11-15 MED ORDER — ONDANSETRON HCL 4 MG/2ML IJ SOLN
INTRAMUSCULAR | Status: DC | PRN
  Administered 2023-11-15: 4 mg via INTRAVENOUS

## 2023-11-15 MED ORDER — PREDNISOLONE ACETATE 1 % OP SUSP
OPHTHALMIC | Status: DC | PRN
  Administered 2023-11-15: 1 [drp] via OPHTHALMIC

## 2023-11-15 MED ORDER — GATIFLOXACIN 0.5 % OP SOLN
OPHTHALMIC | Status: AC
  Filled 2023-11-15: qty 2.5

## 2023-11-15 MED ORDER — LIDOCAINE 2% (20 MG/ML) 5 ML SYRINGE
INTRAMUSCULAR | Status: AC
  Filled 2023-11-15: qty 5

## 2023-11-15 MED ORDER — PHENYLEPHRINE 80 MCG/ML (10ML) SYRINGE FOR IV PUSH (FOR BLOOD PRESSURE SUPPORT)
PREFILLED_SYRINGE | INTRAVENOUS | Status: AC
  Filled 2023-11-15: qty 10

## 2023-11-15 MED ORDER — ACETAMINOPHEN 500 MG PO TABS
1000.0000 mg | ORAL_TABLET | Freq: Once | ORAL | Status: AC
  Administered 2023-11-15: 1000 mg via ORAL
  Filled 2023-11-15: qty 2

## 2023-11-15 MED ORDER — DEXAMETHASONE SODIUM PHOSPHATE 10 MG/ML IJ SOLN
INTRAMUSCULAR | Status: DC | PRN
  Administered 2023-11-15: 10 mg via INTRAVENOUS

## 2023-11-15 MED ORDER — ROCURONIUM BROMIDE 10 MG/ML (PF) SYRINGE
PREFILLED_SYRINGE | INTRAVENOUS | Status: AC
  Filled 2023-11-15: qty 10

## 2023-11-15 MED ORDER — EPHEDRINE 5 MG/ML INJ
INTRAVENOUS | Status: AC
  Filled 2023-11-15: qty 5

## 2023-11-15 MED ORDER — ONDANSETRON HCL 4 MG/2ML IJ SOLN
INTRAMUSCULAR | Status: AC
  Filled 2023-11-15: qty 2

## 2023-11-15 MED ORDER — FENTANYL CITRATE (PF) 250 MCG/5ML IJ SOLN
INTRAMUSCULAR | Status: DC | PRN
  Administered 2023-11-15: 100 ug via INTRAVENOUS

## 2023-11-15 MED ORDER — PHENYLEPHRINE HCL 10 % OP SOLN
1.0000 [drp] | OPHTHALMIC | Status: AC | PRN
  Administered 2023-11-15 (×3): 1 [drp] via OPHTHALMIC
  Filled 2023-11-15: qty 5

## 2023-11-15 MED ORDER — PREDNISOLONE ACETATE 1 % OP SUSP
OPHTHALMIC | Status: AC
  Filled 2023-11-15: qty 5

## 2023-11-15 MED ORDER — CHLORHEXIDINE GLUCONATE 0.12 % MT SOLN
15.0000 mL | Freq: Once | OROMUCOSAL | Status: AC
  Administered 2023-11-15: 15 mL via OROMUCOSAL
  Filled 2023-11-15: qty 15

## 2023-11-15 MED ORDER — SODIUM CHLORIDE 0.9% FLUSH: 3.0000 mL | Freq: Two times a day (BID) | INTRAVENOUS | Status: DC

## 2023-11-15 MED ORDER — FENTANYL CITRATE (PF) 250 MCG/5ML IJ SOLN
INTRAMUSCULAR | Status: AC
  Filled 2023-11-15: qty 5

## 2023-11-15 MED ORDER — PROPOFOL 10 MG/ML IV BOLUS
INTRAVENOUS | Status: DC | PRN
  Administered 2023-11-15: 120 mg via INTRAVENOUS

## 2023-11-15 SURGICAL SUPPLY — 7 items
APPLICATOR DR MATTHEWS STRL (MISCELLANEOUS) IMPLANT
CNTNR URN SCR LID CUP LEK RST (MISCELLANEOUS) IMPLANT
COVER TABLE BACK 60X90 (DRAPES) IMPLANT
GAUZE SPONGE 4X4 12PLY STRL (GAUZE/BANDAGES/DRESSINGS) IMPLANT
TOWEL GREEN STERILE FF (TOWEL DISPOSABLE) ×1 IMPLANT
WATER STERILE IRR 1000ML POUR (IV SOLUTION) ×1 IMPLANT
WIPE INSTRUMENT VISIWIPE 73X73 (MISCELLANEOUS) IMPLANT

## 2023-11-15 NOTE — Brief Op Note (Signed)
 11/15/2023  12:41 PM  PATIENT:  Jill [name]  58 y.o. female  PRE-OPERATIVE DIAGNOSIS:  Proliferative diabetic retinopathy, both eyes  POST-OPERATIVE DIAGNOSIS:  Proliferative diabetic retinopathy, both eyes  PROCEDURE:  Procedure(s): PHOTOCOAGULATION, EYE, USING LASER (Bilateral)  SURGEON:  Surgeons and Role:    * [name], MD - Primary  ASSISTANTS:   ANESTHESIA:   general  EBL:  0 mL   BLOOD ADMINISTERED:none  DRAINS: none   LOCAL MEDICATIONS USED:  NONE  SPECIMEN:  No Specimen  DISPOSITION OF SPECIMEN:  N/A  COUNTS:  YES  TOURNIQUET:  * No tourniquets in log *  DICTATION: .Note written in EPIC  PLAN OF CARE: Discharge to home after PACU  PATIENT DISPOSITION:  PACU - hemodynamically stable.   Delay start of Pharmacological VTE agent (>24hrs) due to surgical blood loss or risk of bleeding: not applicable

## 2023-11-15 NOTE — Discharge Instructions (Addendum)
 POSTOPERATIVE INSTRUCTIONS  Your doctor has performed laser photocoagulation on you at [facility]. [place].  - Use the Prednisolone  acetate (pink cap) drops at dinner and bedtime in BOTH EYES tonight. - Starting tomorrow you will use the Prednisolone  acetate drops 4 times a day in BOTH EYES (use drops at breakfast, lunch, dinner and bedtime, starting tomorrow) - You have a post-op follow up appointment with Dr. Karyl Paget tomorrow at 1245 pm in his clinic  - No strenuous activities  - You may not drive until further notice.  - Tylenol  or any other over-the-counter pain reliever can be used according to your doctor. If more pain medicine is required, your doctor will have a prescription for you.  - You may read, go up and down stairs, and watch television.   [name], M.D., Ph.D.

## 2023-11-15 NOTE — Anesthesia Procedure Notes (Signed)
 Procedure Name: Intubation Date/Time: 11/15/2023 11:29 AM  Performed by: Johann Muta, CRNAPre-anesthesia Checklist: Patient identified, Emergency Drugs available, Suction available and Patient being monitored Patient Re-evaluated:Patient Re-evaluated prior to induction Oxygen Delivery Method: Circle System Utilized Preoxygenation: Pre-oxygenation with 100% oxygen Induction Type: IV induction Ventilation: Mask ventilation without difficulty Laryngoscope Size: Mac and 3 Grade View: Grade II Tube type: Oral Tube size: 7.0 mm Number of attempts: 1 Airway Equipment and Method: Stylet and Oral airway Placement Confirmation: ETT inserted through vocal cords under direct vision, positive ETCO2 and breath sounds checked- equal and bilateral Secured at: 22 cm Tube secured with: Tape Dental Injury: Teeth and Oropharynx as per pre-operative assessment

## 2023-11-15 NOTE — Progress Notes (Signed)
 [facility] - Clinic Note  11/16/2023   CHIEF COMPLAINT Patient presents for Retina Follow Up  HISTORY OF PRESENT ILLNESS: Jill [name] is a 58 y.o. female who presents to the clinic today for:  HPI     Retina Follow Up   Patient presents with  Diabetic Retinopathy.  In both eyes.  This started 16 days ago.  Duration of 16 days.  Since onset it is stable.  I, the attending physician,  performed the HPI with the patient and updated documentation appropriately.        Comments   1 day s/p PRPOU  pt is reporting some blurred vision she denies any flashes or floaters her last reading was 118      Last edited by [name], MD on 11/16/2023 12:29 PM.    Pt states she had no problems after the laser procedure in the OR yesterday, she feel like her vision has been blurry over the past couple of weeks  Referring physician: Ardeth Krabbe, MD [address] [place],  [place] [address]  HISTORICAL INFORMATION:  Selected notes from the MEDICAL RECORD NUMBER Referred by Dr. Artemio Larry for diabetic retinopathy eval -- new subhyaloid heme OD LEE:  Ocular Hx- PMH-   CURRENT MEDICATIONS: No current outpatient medications on file. (Ophthalmic Drugs)   No current facility-administered medications for this visit. (Ophthalmic Drugs)   Current Outpatient Medications (Other)  Medication Sig   acetaminophen  (TYLENOL ) 500 MG tablet Take 500-1,000 mg by mouth every 8 (eight) hours as needed for moderate pain or headache.    aspirin  EC 81 MG tablet Take 81 mg by mouth every morning.   brompheniramine-pseudoephedrine-DM 30-2-10 MG/5ML syrup Take 5 mLs by mouth 4 (four) times daily as needed.   Calcium -Phosphorus-Vitamin D (CITRACAL +D3 PO) Take 2 tablets by mouth in the morning and at bedtime.   clopidogrel  (PLAVIX ) 75 MG tablet Take 75 mg by mouth in the morning.   Cyanocobalamin (VITAMIN B-12) 5000 MCG SUBL Place 5,000 mcg under the tongue 3 (three) times a week.    diazePAM, 20 MG Dose, (VALTOCO 20 MG DOSE) 2 x 10 MG/0.1ML LQPK Place 20 mg into the nose as needed (for seizure).   docusate sodium  (COLACE) 100 MG capsule Take 100 mg by mouth in the morning.   ezetimibe  (ZETIA ) 10 MG tablet Take 1 tablet (10 mg total) by mouth daily.   felodipine  (PLENDIL ) 5 MG 24 hr tablet Take 5 mg by mouth in the morning.   furosemide  (LASIX ) 20 MG tablet Take 1 tablet (20 mg total) by mouth every other day.   gabapentin  (NEURONTIN ) 300 MG capsule Take 300 mg by mouth 2 (two) times daily.   lamoTRIgine  (LAMICTAL ) 100 MG tablet Take 1 tablet (100 mg total) by mouth 2 (two) times daily.   levETIRAcetam  (KEPPRA ) 500 MG tablet Take 250 mg by mouth 2 (two) times daily.   linaclotide (LINZESS) 145 MCG CAPS capsule Take 145 mcg by mouth daily before breakfast.   LORazepam  (ATIVAN ) 0.5 MG tablet Take 0.5 mg by mouth daily as needed for anxiety.   metFORMIN  (GLUCOPHAGE ) 500 MG tablet Take 500 mg by mouth 2 (two) times daily.   metoprolol  succinate (TOPROL -XL) 25 MG 24 hr tablet Take 1 tablet by mouth daily   Multiple Vitamins-Minerals (BARIATRIC MULTIVITAMINS/IRON PO) Take 1 tablet by mouth every evening.   olmesartan (BENICAR) 40 MG tablet Take 40 mg by mouth every morning.    OZEMPIC, 1 MG/DOSE, 2 MG/1.5ML SOPN  Inject 2 mg into the skin once a week.   pantoprazole  (PROTONIX ) 40 MG tablet Take 1 tablet (40 mg total) by mouth 2 (two) times daily.   pioglitazone  (ACTOS ) 30 MG tablet Take 30 mg by mouth every morning.   potassium chloride  (KLOR-CON  M) 10 MEQ tablet Take 2 tablets (20 mEq total) by mouth every other day. Take on days you take Lasix .   rosuvastatin  (CRESTOR ) 40 MG tablet Take 1 tablet (40 mg total) by mouth at bedtime.   No current facility-administered medications for this visit. (Other)   REVIEW OF SYSTEMS: ROS   Positive for: Gastrointestinal, Neurological, Endocrine, Cardiovascular, Eyes Negative for: Constitutional, Skin, Genitourinary, Musculoskeletal, HENT,  Respiratory, Psychiatric, Allergic/Imm, Heme/Lymph Last edited by Alise Appl, COT on 11/16/2023 12:05 PM.      ALLERGIES Allergies  Allergen Reactions   Dilaudid  [Hydromorphone  Hcl] Nausea And Vomiting   Crab Extract Swelling    Crab Cakes   Crab [Shellfish Allergy] Swelling   Hydrocodone      "Don't like the way it feels"   Other Other (See Comments)    "pt is a difficult IV stick, prefers IV team paged for IV starts"   PAST MEDICAL HISTORY Past Medical History:  Diagnosis Date   Anemia    Anxiety    Arthritis    Back pain    complains when laying on hard flat surface   Chronic kidney disease    appt. /w urology 11/10/2014- for a cyst seen on US . evaluated was told it was nothing.   Diabetes mellitus    Diabetes II, oral and GLP-1.   DVT (deep venous thrombosis) (HCC)    right leg '09 or '10   Fibroids    GERD (gastroesophageal reflux disease)    Heart murmur    History of kidney stones    History of stress test    referred for cardiac cath- done here at [facility]- 09/2014   Hypercholesterolemia    Hypertension    Neuromuscular disorder (HCC)    neuropathy   PONV (postoperative nausea and vomiting)    Seizures (HCC)    02/01/23- last seizure approximately 2015;   Sleep apnea    using cpap as of 05/23/23   Stroke [facility])    '09-had blockage in brain, too deep to operate"occ left lower extremity fatique, occ. 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   Past Surgical History:  Procedure Laterality Date   APPENDECTOMY     BIOPSY  11/18/2021   Procedure: BIOPSY;  Surgeon: Urban Garden, MD;  [facility]: [facility];  Service: Gastroenterology;;  random colon;   BIOPSY  12/27/2021   Procedure: BIOPSY;  Surgeon: Urban Garden, MD;  [facility]: [facility];  Service: Gastroenterology;;   CARDIAC CATHETERIZATION     CESAREAN SECTION  1986 & 1988   x2    CHOLECYSTECTOMY     open"gallstones"   COLONOSCOPY WITH PROPOFOL  N/A 11/18/2021   Procedure: COLONOSCOPY WITH PROPOFOL ;  Surgeon: Urban Garden, MD;  [facility]: [facility];  Service: Gastroenterology;  Laterality: N/A;  1215   DILATATION & CURETTAGE/HYSTEROSCOPY WITH MYOSURE N/A 05/10/2017   Procedure: DILATATION & CURETTAGE/HYSTEROSCOPY WITH MYOSURE;  Surgeon: Ivery Marking, MD;  Location: [facility];  Service: Gynecology;  Laterality: N/A;   ESOPHAGOGASTRODUODENOSCOPY (EGD) WITH PROPOFOL  N/A 12/27/2021   Procedure: ESOPHAGOGASTRODUODENOSCOPY (EGD) WITH PROPOFOL ;  Surgeon: Urban Garden, MD;  [facility];  Service: Gastroenterology;  Laterality: N/A;  100   ESOPHAGOGASTRODUODENOSCOPY (EGD) WITH PROPOFOL  N/A 01/13/2022   Procedure: ESOPHAGOGASTRODUODENOSCOPY (EGD) WITH PROPOFOL ;  Surgeon: Urban Garden, MD;  [facility];  Service: Gastroenterology;  Laterality: N/A;  235, per Hugo Maes pt knows to arrive at 6:15   HEMOSTASIS CLIP PLACEMENT  01/13/2022   Procedure: HEMOSTASIS CLIP PLACEMENT;  Surgeon: Urban Garden, MD;  [facility]: [facility];  Service: Gastroenterology;;   INCISION AND DRAINAGE ABSCESS  02/23/2023   Procedure: INCISION AND DRAINAGE OF PERI RECTAL ABSCESS;  Surgeon: Melvenia Stabs, MD;  Location: [facility];  Service: General;;   INTERVENTIONAL RADIOLOGY PROCEDURE     CT angiography /neck -Dr. Alvira Josephs.   IR ANGIO INTRA EXTRACRAN SEL COM CAROTID INNOMINATE BILAT MOD SED  02/22/2018   IR ANGIO INTRA EXTRACRAN SEL COM CAROTID INNOMINATE BILAT MOD SED  03/08/2022   IR ANGIO INTRA EXTRACRAN SEL COM CAROTID INNOMINATE BILAT MOD SED  09/18/2023   IR ANGIO VERTEBRAL SEL SUBCLAVIAN INNOMINATE UNI L MOD SED  09/18/2023   IR ANGIO VERTEBRAL SEL VERTEBRAL BILAT MOD SED  02/22/2018   IR ANGIO VERTEBRAL SEL VERTEBRAL UNI R MOD SED  03/08/2022   IR ANGIO VERTEBRAL SEL VERTEBRAL UNI R MOD SED  09/18/2023   IR  GENERIC HISTORICAL  08/03/2016   IR RADIOLOGIST EVAL & MGMT 08/03/2016 [facility]   IR RADIOLOGIST EVAL & MGMT  01/03/2022   LAPAROSCOPIC GASTRIC SLEEVE RESECTION N/A 04/10/2016   Procedure: LAPAROSCOPIC GASTRIC SLEEVE RESECTION WITH UPPER ENDO;  Surgeon: Aldean Hummingbird, MD;  Location: [facility];  Service: General;  Laterality: N/A;   LEFT HEART CATHETERIZATION WITH CORONARY ANGIOGRAM N/A 09/28/2014   Procedure: LEFT HEART CATHETERIZATION WITH CORONARY ANGIOGRAM;  Surgeon: Lucendia Rusk, MD;  Location: [facility] CATH LAB;  Service: Cardiovascular;  Laterality: N/A;   LIGATION OF INTERNAL FISTULA TRACT N/A 05/24/2023   Procedure: LIGATION OF INTERSPHINCTERIC FISTULOUS TRACT;  Surgeon: Melvenia Stabs, MD;  Location: [facility];  Service: General;  Laterality: N/A;  90   NEPHROLITHOTOMY Right 02/01/2022   Procedure: RIGHT NEPHROLITHOTOMY PERCUTANEOUS WITH SURGEON ACCESS;  Surgeon: Andrez Banker, MD;  Location: [facility];  Service: Urology;  Laterality: Right;   NEPHROLITHOTOMY Left 02/15/2022   Procedure: LEFT NEPHROLITHOTOMY PERCUTANEOUS WITH SURGEON ACCESS, REMOVAL OF RIGHT URETERAL STENT;  Surgeon: Andrez Banker, MD;  Location: [facility];  Service: Urology;  Laterality: Left;  195 MINUTES   PHOTOCOAGULATION WITH LASER Bilateral 11/15/2023   Procedure: PHOTOCOAGULATION, EYE, USING LASER;  Surgeon: [name], MD;  Location: [facility] OR;  Service: Ophthalmology;  Laterality: Bilateral;   PLACEMENT OF SETON N/A 02/23/2023   Procedure: SURGICAL TREATMENT OF ANAL FISTULA-TRANSSPHINTERIL WITH PLACEMENT OF SETON;  Surgeon: Melvenia Stabs, MD;  Location: [facility];  Service: General;  Laterality: N/A;   POLYPECTOMY  01/13/2022   Procedure: POLYPECTOMY;  Surgeon: Urban Garden, MD;  [facility];  Service: Gastroenterology;;   RADIOLOGY WITH ANESTHESIA N/A 10/14/2014   Procedure: Neita Balo;  Surgeon: Luellen Sages, MD;  Location: [facility];  Service:  Radiology;  Laterality: N/A;   RECTAL EXAM UNDER ANESTHESIA N/A 05/24/2023   Procedure: ANORECTAL EXAM UNDER ANESTHESIA;  Surgeon: Melvenia Stabs, MD;  Location: [facility];  Service: General;  Laterality: N/A;   TUBAL LIGATION     FAMILY HISTORY Family History  Problem Relation Age of Onset   Cataracts Mother    Kidney  disease Mother        dialysis for 26 years   Heart attack Mother        15's   Diabetes Father    Cataracts Father    Heart failure Father    Heart disease Father    Heart attack Father        69's   Stroke Paternal Grandmother    SOCIAL HISTORY Social History   Tobacco Use   Smoking status: Every Day    Current packs/day: 0.25    Average packs/day: 0.3 packs/day for 48.3 years (12.1 ttl pk-yrs)    Types: Cigarettes    Start date: 07/25/1975    Passive exposure: Current   Smokeless tobacco: Never  Vaping Use   Vaping status: Never Used  Substance Use Topics   Alcohol use: Never   Drug use: No       OPHTHALMIC EXAM:  Base Eye Exam     Visual Acuity (Snellen - Linear)       Right Left   Dist cc 20/30 20/70 -2   Dist ph cc  20/40 -2    Correction: Glasses         Tonometry (Tonopen, 12:13 PM)       Right Left   Pressure 17 15         Pupils       Dark Light Shape React APD   Right 2 1 Round Brisk None   Left 2 1 Round Brisk None         Visual Fields       Left Right    Full Full         Extraocular Movement       Right Left    Full, Ortho Full, Ortho         Neuro/Psych     Oriented x3: Yes   Mood/Affect: Normal         Dilation     Both eyes: 2.5% Phenylephrine  @ 12:13 PM           Slit Lamp and Fundus Exam     Slit Lamp Exam       Right Left   Lids/Lashes Dermatochalasis - upper lid Dermatochalasis - upper lid   Conjunctiva/Sclera mild melanosis mild melanosis   Cornea tear film debris tear film debris   Anterior Chamber deep and clear deep and clear   Iris Round and  dilated, No NVI Round and dilated, No NVI   Lens 2+ Nuclear sclerosis, 2-3+ Cortical cataract 2+ Nuclear sclerosis, 2-3+ Cortical cataract, 2+ Posterior subcapsular cataract   Anterior Vitreous Vitreous syneresis, stable improvement in vitreous and subhyaloid heme, white vitreous condensations inferiorly Vitreous syneresis         Fundus Exam       Right Left   Disc Pink and Sharp, no NVD, +fibrosis Pink and Sharp, +cupping   C/D Ratio 0.5 0.65   Macula Flat, Good foveal reflex, RPE mottling and clumping, No heme or edema Flat, Good foveal reflex, RPE mottling and clumping, scattered MA/DBH -- improved, no frank edema   Vessels attenuated, Tortuous, scattered NVE IN midzone regressing attenuated, tortuous, early midzonal NVE   Periphery Attached, boat shaped subhyaloid heme inferior midzone improved, scattered NVE IN midzone, good early PRP 360 Attached, scattered MA, white now boat shaped subhayloid / pre retinal heme inferior midzone -- stably improved, good PRP changes nasal hemisphere -- now with good early fill in changes posteriorly with room for fill  in temporally           Refraction     Wearing Rx       Sphere Cylinder Axis   Right -2.00 +0.50 178   Left -2.00 +0.75 014           IMAGING AND PROCEDURES  Imaging and Procedures for 11/16/2023  OCT, Retina - OU - Both Eyes       Right Eye Quality was good. Central Foveal Thickness: 200. Progression has improved. Findings include normal foveal contour, no IRF, no SRF, intraretinal hyper-reflective material (Trace cystic changes ST mac -- stably improved, Partial PVD with stable improvement in vitreous opacities).   Left Eye Quality was good. Central Foveal Thickness: 215. Progression has been stable. Findings include normal foveal contour, no IRF, no SRF, vitreomacular adhesion (No DME, Mild diffuse atrophy, stable improvement in pre-retinal hyper reflective material along inferior arcades, stable improvement in  vitreous opacities).   Notes *Images captured and stored on drive  Diagnosis / Impression:  No DME OU OD: Trace cystic changes ST mac -- stably improved, Partial PVD with stable improvement in vitreous opacities OS: No DME, Mild diffuse atrophy, stable improvement in pre-retinal hyper reflective material along inferior arcades, stable improvement in vitreous opacities  Clinical management:  See below  Abbreviations: NFP - Normal foveal profile. CME - cystoid macular edema. PED - pigment epithelial detachment. IRF - intraretinal fluid. SRF - subretinal fluid. EZ - ellipsoid zone. ERM - epiretinal membrane. ORA - outer retinal atrophy. ORT - outer retinal tubulation. SRHM - subretinal hyper-reflective material. IRHM - intraretinal hyper-reflective material      Color Fundus Photography Optos - OU - Both Eyes       Right Eye Progression has no prior data. Disc findings include normal observations. Macula : normal observations. Vessels : normal observations. Periphery : RPE abnormality (Good early PRP changes 360).   Left Eye Progression has no prior data. Disc findings include pallor. Macula : normal observations. Vessels : (Mild tortuosity ). Periphery : (Good PRP fill in changes nasally and posteriorly).   Notes **Images stored on drive**  Impression: H/o PDR OU OD: Good early PRP changes 360 OS: Good PRP fill in changes nasally and posteriorly      XR Lumb Spine Flex&Ext Only       XRs of the lumbar spine from 11/16/2023 were independently reviewed and interpreted, showing spondylolisthesis at L4/5.  Disc height loss at L3/4 and L4/5.  No fracture or dislocation seen.           ASSESSMENT/PLAN:   ICD-10-CM   1. Proliferative diabetic retinopathy of both eyes without macular edema associated with type 2 diabetes mellitus (HCC)  E11.3593 OCT, Retina - OU - Both Eyes    Color Fundus Photography Optos - OU - Both Eyes    2. Subhyaloid hemorrhage of both eyes  H35.63      3. Long term (current) use of oral hypoglycemic drugs  Z79.84     4. Long-term (current) use of injectable non-insulin  antidiabetic drugs  Z79.85     5. Essential hypertension  I10     6. Hypertensive retinopathy of both eyes  H35.033     7. Combined forms of age-related cataract of both eyes  H25.813     8. Subhyaloid hemorrhage of right eye  H35.61      1-4. Proliferative diabetic retinopathy w/o DME, OU - s/p IVA OD #1 (05.23.24), #2 (06.24.24), #3 (06.24.24), #4 (08.20.24), #5 (09.17.24), #6 (10.15.24) #  7(03.14.25),  - s/p IVA OS #1 (06.24.24), #2 (06.24.24), #3 (08.20.24), #4 (09.17.24), #5 (10.15.24), #6 (12.03.24) - s/p PRP OS (06.03.24) -- incomplete / aborted due to history of photogenic seizures - s/p PRP OU (04.25.25) -- done in the OR under [facility]  - mild VH improved OU - FA (05.23.24) shows OD: Boat shaped blockage inferior midzone from subhyaloid heme, focal NVE and vascular non-perfusion IN midzone; OS: Scattered punctate NVE greatest inferior midzone -- pt needs PRP OU - repeat FA (07.23.24): OD: low fluorescein  signal, focal NVE and vascular non-perfusion IN midzone -- improved; OS: Low fluorescein  signal, punctate NVE greatest inferior midzone -- improved, new boat shaped heme inferior to disc - repeat FA (10.15.24) OD: focal NVE and vascular non-perfusion IN midzone -- improved w/ regression of NV, no leakage; OS: punctate NVE greatest inferior midzone -- resolved, boat shaped heme inferior to disc- resolved - repeat FA (03.14.25) shows OD: vascular non-perfusion with interval re-development of NVE IN midzone; OS: punctate NVE greatest inferior midzone -- slightly worse, boat shaped heme inferior to disc -- resolved **recurrent NVE with stoppage of anti-VEGF therapy** - s/p PRP OU (04.25.25) in OR due to h/o photogenic seizures - good early laser changes - continue PF QID OU x 7 days - f/u 6 weeks, DFE, OCT  5,6. Hypertensive retinopathy OU - discussed importance of  tight BP control - monitor  7. Mixed Cataract OU - The symptoms of cataract, surgical options, and treatments and risks were discussed with patient. - discussed diagnosis and progression - monitor  8. Hx of photogenic seizures  Ophthalmic Meds Ordered this visit:  No orders of the defined types were placed in this encounter.    Return in about 6 weeks (around 12/28/2023) for f/u PDR OU, DFE, OCT.  There are no Patient Instructions on file for this visit.  This document serves as a record of services personally performed by Jeanice Millard, MD, PhD. It was created on their behalf by Eller Gut COT, an ophthalmic technician. The creation of this record is the provider's dictation and/or activities during the visit.    Electronically signed by: Eller Gut COT 04.24.25 2:32 AM               This document serves as a record of services personally performed by Jeanice Millard, MD, PhD. It was created on their behalf by Morley Arabia. Bevin Bucks, OA an ophthalmic technician. The creation of this record is the provider's dictation and/or activities during the visit.    Electronically signed by: Morley Arabia. Bevin Bucks, OA 11/18/23 2:32 AM  Jeanice Millard, M.D., Ph.D. Diseases & Surgery of the Retina and Vitreous [facility] 11/16/2023   I have reviewed the above documentation for accuracy and completeness, and I agree with the above. Jeanice Millard, M.D., Ph.D. 11/18/23 2:35 AM   Abbreviations: M myopia (nearsighted); A astigmatism; H hyperopia (farsighted); P presbyopia; Mrx spectacle prescription;  CTL contact lenses; OD right eye; OS left eye; OU both eyes  XT exotropia; ET esotropia; PEK punctate epithelial keratitis; PEE punctate epithelial erosions; DES dry eye syndrome; MGD meibomian gland dysfunction; ATs artificial tears; PFAT's preservative free artificial tears; NSC nuclear sclerotic cataract; PSC posterior subcapsular cataract; ERM epi-retinal membrane;  PVD posterior vitreous detachment; RD retinal detachment; DM diabetes mellitus; DR diabetic retinopathy; NPDR non-proliferative diabetic retinopathy; PDR proliferative diabetic retinopathy; CSME clinically significant macular edema; DME diabetic macular edema; dbh dot blot hemorrhages; CWS cotton wool spot; POAG primary  open angle glaucoma; C/D cup-to-disc ratio; HVF humphrey visual field; GVF goldmann visual field; OCT optical coherence tomography; IOP intraocular pressure; BRVO Branch retinal vein occlusion; CRVO central retinal vein occlusion; CRAO central retinal artery occlusion; BRAO branch retinal artery occlusion; RT retinal tear; SB scleral buckle; PPV pars plana vitrectomy; VH Vitreous hemorrhage; PRP panretinal laser photocoagulation; IVK intravitreal kenalog; VMT vitreomacular traction; MH Macular hole;  NVD neovascularization of the disc; NVE neovascularization elsewhere; AREDS age related eye disease study; ARMD age related macular degeneration; POAG primary open angle glaucoma; EBMD epithelial/anterior basement membrane dystrophy; ACIOL anterior chamber intraocular lens; IOL intraocular lens; PCIOL posterior chamber intraocular lens; Phaco/IOL phacoemulsification with intraocular lens placement; PRK photorefractive keratectomy; LASIK laser assisted in situ keratomileusis; HTN hypertension; DM diabetes mellitus; COPD chronic obstructive pulmonary disease

## 2023-11-15 NOTE — Interval H&P Note (Signed)
 History and Physical Interval Note:  11/15/2023 10:54 AM  Jill [name]  has presented today for surgery, with the diagnosis of Proliferative diabetic retinopathy, both eyes.  The various methods of treatment have been discussed with the patient and family. After consideration of risks, benefits and other options for treatment, the patient has consented to  Procedure(s): PHOTOCOAGULATION, EYE, USING LASER (Bilateral) as a surgical intervention.  The patient's history has been reviewed, patient examined, no change in status, stable for surgery.  I have reviewed the patient's chart and labs.  Questions were answered to the patient's satisfaction.     [name]

## 2023-11-15 NOTE — Op Note (Signed)
 Date of procedure:  04.24.2025   Surgeon: [name], MD, PhD  Pre-operative Diagnoses:  Proliferative diabetic retinopathy, BOTH EYES Photogenic seizures   Post-operative diagnosis:  same   Anesthesia: General   Procedure:   1. Panretinal photocoagulation via indirect ophthalmoscopy, BOTH EYES   Indications for procedure: This is a 58 yo F with a history of proliferative diabetic retinopathy OU and photogenic seizures. Due to a history of diabetic hemorrhages and active neovascularization OU, we have recommended panretinal photocoagulation (PRP) to both eyes. However, due to her history of photogenic seizures, we have recommended the PRP laser laser procedure be done under general anesthesia to reduce the risk of inducing a seizure. After discussing the risks, benefits, and alternatives to the laser procedure, the patient has elected to proceed with laser PRP to both eyes under general anesthesia and informed consent was obtained.   Procedure in Detail:    The patient was met in the pre-operative holding area where their identification data was verified.  It was noted that there was a signed, informed consent in the chart and both eyes were verbally verified for laser by the patient. Both eyes were marked with a marking pen. The patient was then taken to the operating room and placed in the supine position. General endotracheal anesthesia was induced.   Attention was first turned to the RIGHT EYE. A wire lid speculum was placed in the right eye and throughout the procedure, the eye was kept lubricated with sterile BSS. Using the laser indirect ophthalmoscope and a moistened q-tip as a scleral depressor, 1181 spots were placed at 260-320 mW power, 150 ms duration to the peripheral retina, outside the temporal arcades, 360 degrees. There were no complications. The lid speculum was removed and one drop of antibiotic and prednisolone  acetate were instilled into the right eye.  Similarly, a  clean lid speculum was placed in the left eye and sterile BSS was used throughout the laser procedure to keep the eye lubricated. Of note, the left eye had previously received some partial PRP, so the laser here was fill-in laser to areas that were not adequately treated. Using the laser indirect ophthalmoscope and a moistened q-tip as a scleral depressor, 619 spots were placed at 260-320 mW power, 150 ms duration to the peripheral retina, outside the temporal arcades, 270 degrees. There were no complications, however cortical cataract obscured our view and prevented laser uptake in the peripheral temporal quadrant. The lid speculum was removed and one drop of antibiotic and prednisolone  acetate were instilled into the left eye.  The patient tolerated the procedure well without any intraoperative or immediate postoperative complications. The patient was taken to the recovery room in good condition. The patient is scheduled be seen by Dr. Karyl Paget tomorrow in clinic.

## 2023-11-15 NOTE — Anesthesia Postprocedure Evaluation (Signed)
 Anesthesia Post Note  Patient: Jill [name]  Procedure(s) Performed: PHOTOCOAGULATION, EYE, USING LASER (Bilateral: Eye)     Patient location during evaluation: PACU Anesthesia Type: General Level of consciousness: awake and alert Pain management: pain level controlled Vital Signs Assessment: post-procedure vital signs reviewed and stable Respiratory status: spontaneous breathing, nonlabored ventilation and respiratory function stable Cardiovascular status: blood pressure returned to baseline and stable Postop Assessment: no apparent nausea or vomiting Anesthetic complications: no   No notable events documented.  Last Vitals:  Vitals:   11/15/23 1300 11/15/23 1303  BP: (!) 98/46 (!) 104/50  Pulse: 68 71  Resp: 15 16  Temp:    SpO2: 97% 96%    Last Pain:  Vitals:   11/15/23 1236  TempSrc:   PainSc: 0-No pain                 Erin Havers

## 2023-11-15 NOTE — Transfer of Care (Signed)
 Immediate Anesthesia Transfer of Care Note  Patient: Jill [name]  Procedure(s) Performed: PHOTOCOAGULATION, EYE, USING LASER (Bilateral: Eye)  Patient Location: PACU  Anesthesia Type:General  Level of Consciousness: awake and alert   Airway & Oxygen Therapy: Patient Spontanous Breathing and Patient connected to nasal cannula oxygen  Post-op Assessment: Report given to RN and Post -op Vital signs reviewed and stable  Post vital signs: Reviewed and stable  Last Vitals:  Vitals Value Taken Time  BP 100/51 11/15/23 1236  Temp    Pulse 71 11/15/23 1241  Resp 18 11/15/23 1241  SpO2 98 % 11/15/23 1241  Vitals shown include unfiled device data.  Last Pain:  Vitals:   11/15/23 0949  TempSrc:   PainSc: 0-No pain         Complications: No notable events documented.

## 2023-11-16 ENCOUNTER — Ambulatory Visit (INDEPENDENT_AMBULATORY_CARE_PROVIDER_SITE_OTHER): Admitting: Ophthalmology

## 2023-11-16 ENCOUNTER — Ambulatory Visit: Admitting: Orthopedic Surgery

## 2023-11-16 ENCOUNTER — Ambulatory Visit (INDEPENDENT_AMBULATORY_CARE_PROVIDER_SITE_OTHER): Payer: Self-pay

## 2023-11-16 ENCOUNTER — Encounter (HOSPITAL_COMMUNITY): Payer: Self-pay | Admitting: Ophthalmology

## 2023-11-16 VITALS — BP 135/84 | HR 74 | Ht 66.5 in | Wt 224.8 lb

## 2023-11-16 DIAGNOSIS — H35033 Hypertensive retinopathy, bilateral: Secondary | ICD-10-CM

## 2023-11-16 DIAGNOSIS — E113593 Type 2 diabetes mellitus with proliferative diabetic retinopathy without macular edema, bilateral: Secondary | ICD-10-CM | POA: Diagnosis not present

## 2023-11-16 DIAGNOSIS — M545 Low back pain, unspecified: Secondary | ICD-10-CM

## 2023-11-16 DIAGNOSIS — M5416 Radiculopathy, lumbar region: Secondary | ICD-10-CM

## 2023-11-16 DIAGNOSIS — Z7985 Long-term (current) use of injectable non-insulin antidiabetic drugs: Secondary | ICD-10-CM | POA: Diagnosis not present

## 2023-11-16 DIAGNOSIS — H3563 Retinal hemorrhage, bilateral: Secondary | ICD-10-CM | POA: Diagnosis not present

## 2023-11-16 DIAGNOSIS — H25813 Combined forms of age-related cataract, bilateral: Secondary | ICD-10-CM

## 2023-11-16 DIAGNOSIS — Z7984 Long term (current) use of oral hypoglycemic drugs: Secondary | ICD-10-CM | POA: Diagnosis not present

## 2023-11-16 DIAGNOSIS — H3561 Retinal hemorrhage, right eye: Secondary | ICD-10-CM

## 2023-11-16 DIAGNOSIS — I10 Essential (primary) hypertension: Secondary | ICD-10-CM

## 2023-11-16 NOTE — Progress Notes (Signed)
 Orthopedic Spine Surgery Office Note  Assessment: Patient is a 58 y.o. female with low back pain that radiates into the left lower extremity mostly along the lateral aspect of the level the ankle.  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 Discussed that the conservative treatments include:  -activity modification  -physical therapy  -over the counter pain medications  -medrol  dosepak  -lumbar steroid injections -Patient has tried PT, Tylenol , Aleve, gabapentin , Flexeril  -Talked about her remaining options.  Discussed injection, surgery, and pain management as remaining things to try.  She was not interested in injection or surgery at this time and wanted to proceed with pain management.  Referral provided to her today -Would need an updated A1c that is less than 7.5 prior to any elective spine surgery.  Would also need to be nicotine  free -Patient should return to office on an as-needed basis   Patient expressed understanding of the plan and all questions were answered to the patient's satisfaction.   ___________________________________________________________________________   History:  Patient is a 58 y.o. female who presents today for lumbar spine.  Patient has had about 4 months of low back pain that radiates into the left lower extremity.  There is no trauma or injury that preceded the onset of symptoms.  She feels the pain in her lower lumbar region going into the left leg.  No pain rating to the right lower extremity.  She mostly feels the pain along the lateral aspect of the thigh and leg but does notice it in other distributions as well.  She describes the pain as a pulsing and electrical shocking type sensation.  She has tried several conservative treatments over the last couple of months but has not noticed relief with any  of those.   Weakness: Yes, has residual left leg weakness from a prior stroke.  No new weakness Symptoms of imbalance: Yes, sometimes feels off balance the results of the left leg weakness from her stroke Paresthesias and numbness: Denies Bowel or bladder incontinence: Denies Saddle anesthesia: Denies  Treatments tried: PT, Tylenol , Aleve, gabapentin , Flexeril   Review of systems: Denies fevers and chills, night sweats, unexplained weight loss, history of cancer.  Has had pain that wakes her at night  Past medical history: History of stroke HLD HTN DM Anxiety OSA Epilepsy Vertigo  Allergies: Hydromorphone , hydrocodone   Past surgical history:  Laser eye surgery Cesarean section Cholecystectomy Kidney stone removal Appendectomy  Social history: Reports use of nicotine  product (smoking, vaping, patches, smokeless) Alcohol use: denies Denies recreational drug use   Physical Exam:  General: no acute distress, appears stated age Neurologic: alert, answering questions appropriately, following commands Respiratory: unlabored breathing on room air, symmetric chest rise Psychiatric: appropriate affect, normal cadence to speech   MSK (spine):  -Strength exam      Left  Right EHL    4/5  4/5 TA    5/5  5/5 GSC    5/5  5/5 Knee extension  5/5  5/5 Hip flexion   4/5  5/5  -Sensory exam    Sensation intact to light touch in L3-S1 nerve distributions of bilateral lower extremities  -Achilles DTR: 1/4 on the left, 1/4 on the right -Patellar tendon DTR: 1/4 on the left, 1/4 on the right  -Straight leg raise: Negative bilaterally -Femoral nerve stretch test: Negative bilaterally -Clonus: no beats bilaterally  -Left hip  exam: No pain through range of motion, negative Stinchfield, negative FABER -Right hip exam: No pain through range of motion, negative Stinchfield, negative FABER  Imaging: XRs of the lumbar spine from 11/16/2023 were independently reviewed and  interpreted, showing spondylolisthesis at L4/5.  Disc height loss at L3/4 and L4/5.  No fracture or dislocation seen.  MRI of the lumbar spine from 10/16/2023 was independently reviewed and interpreted, showing spondylolisthesis at L4/5.  Facet joints with increased T2 signal particularly on the left.  Left T2 signal within the facet measures 3.6 mm central and lateral recess stenosis at L4/5.  No other significant stenosis seen.   Patient name: Jill [name] Patient MRN: [id number] Date of visit: 11/16/23

## 2023-11-23 ENCOUNTER — Encounter: Payer: Self-pay | Admitting: Radiology

## 2023-12-20 ENCOUNTER — Ambulatory Visit: Admitting: Neurology

## 2023-12-27 NOTE — Progress Notes (Signed)
 [facility] - Clinic Note  12/28/2023   CHIEF COMPLAINT Patient presents for Retina Follow Up  HISTORY OF PRESENT ILLNESS: Jill [name] is a 58 y.o. female who presents to the clinic today for:  HPI     Retina Follow Up   Patient presents with  Diabetic Retinopathy.  In both eyes.  This started 6 weeks ago.  I, the attending physician,  performed the HPI with the patient and updated documentation appropriately.        Comments   Patient here for 6 weeks retina follow up for PDR OU. Patient states vision sometimes not that great. When uses phone has to have it at nose to see. No eye pain.       Last edited by Jaison Petraglia, MD on 12/28/2023 12:53 PM.     Pt states she sometimes has to put stuff right in front her face to be able to see, she's unsure how old the glasses she's wearing are bc she has "10 pairs", she does not wear bifocals  Referring physician: Ardeth Krabbe, MD [address] [place],  [place] [address]  HISTORICAL INFORMATION:  Selected notes from the MEDICAL RECORD NUMBER Referred by Dr. Artemio Larry for diabetic retinopathy eval -- new subhyaloid heme OD LEE:  Ocular Hx- PMH-   CURRENT MEDICATIONS: No current outpatient medications on file. (Ophthalmic Drugs)   No current facility-administered medications for this visit. (Ophthalmic Drugs)   Current Outpatient Medications (Other)  Medication Sig   acetaminophen  (TYLENOL ) 500 MG tablet Take 500-1,000 mg by mouth every 8 (eight) hours as needed for moderate pain or headache.    aspirin  EC 81 MG tablet Take 81 mg by mouth every morning.   brompheniramine-pseudoephedrine-DM 30-2-10 MG/5ML syrup Take 5 mLs by mouth 4 (four) times daily as needed.   Calcium -Phosphorus-Vitamin D (CITRACAL +D3 PO) Take 2 tablets by mouth in the morning and at bedtime.   clopidogrel  (PLAVIX ) 75 MG tablet Take 75 mg by mouth in the morning.   Cyanocobalamin (VITAMIN B-12) 5000 MCG SUBL Place 5,000 mcg under the  tongue 3 (three) times a week.   diazePAM, 20 MG Dose, (VALTOCO 20 MG DOSE) 2 x 10 MG/0.1ML LQPK Place 20 mg into the nose as needed (for seizure).   docusate sodium  (COLACE) 100 MG capsule Take 100 mg by mouth in the morning.   felodipine  (PLENDIL ) 5 MG 24 hr tablet Take 5 mg by mouth in the morning.   furosemide  (LASIX ) 20 MG tablet Take 1 tablet (20 mg total) by mouth every other day.   gabapentin  (NEURONTIN ) 300 MG capsule Take 300 mg by mouth 2 (two) times daily.   lamoTRIgine  (LAMICTAL ) 100 MG tablet Take 1 tablet (100 mg total) by mouth 2 (two) times daily.   levETIRAcetam  (KEPPRA ) 500 MG tablet Take 250 mg by mouth 2 (two) times daily.   linaclotide (LINZESS) 145 MCG CAPS capsule Take 145 mcg by mouth daily before breakfast.   LORazepam  (ATIVAN ) 0.5 MG tablet Take 0.5 mg by mouth daily as needed for anxiety.   metFORMIN  (GLUCOPHAGE ) 500 MG tablet Take 500 mg by mouth 2 (two) times daily.   metoprolol  succinate (TOPROL -XL) 25 MG 24 hr tablet Take 1 tablet by mouth daily   Multiple Vitamins-Minerals (BARIATRIC MULTIVITAMINS/IRON PO) Take 1 tablet by mouth every evening.   olmesartan (BENICAR) 40 MG tablet Take 40 mg by mouth every morning.    OZEMPIC, 1 MG/DOSE, 2 MG/1.5ML SOPN Inject 2 mg into the skin  once a week.   pantoprazole  (PROTONIX ) 40 MG tablet Take 1 tablet (40 mg total) by mouth 2 (two) times daily.   pioglitazone  (ACTOS ) 30 MG tablet Take 30 mg by mouth every morning.   potassium chloride  (KLOR-CON  M) 10 MEQ tablet Take 2 tablets (20 mEq total) by mouth every other day. Take on days you take Lasix .   rosuvastatin  (CRESTOR ) 40 MG tablet Take 1 tablet (40 mg total) by mouth at bedtime.   ezetimibe  (ZETIA ) 10 MG tablet Take 1 tablet (10 mg total) by mouth daily.   No current facility-administered medications for this visit. (Other)   REVIEW OF SYSTEMS: ROS   Positive for: Gastrointestinal, Neurological, Endocrine, Cardiovascular, Eyes Negative for: Constitutional, Skin,  Genitourinary, Musculoskeletal, HENT, Respiratory, Psychiatric, Allergic/Imm, Heme/Lymph Last edited by Sylvan Evener, COA on 12/28/2023  9:42 AM.       ALLERGIES Allergies  Allergen Reactions   Dilaudid  [Hydromorphone  Hcl] Nausea And Vomiting   Crab Extract Swelling    Crab Cakes   Crab [Shellfish Allergy] Swelling   Hydrocodone      "Don't like the way it feels"   Other Other (See Comments)    "pt is a difficult IV stick, prefers IV team paged for IV starts"   PAST MEDICAL HISTORY Past Medical History:  Diagnosis Date   Anemia    Anxiety    Arthritis    Back pain    complains when laying on hard flat surface   Chronic kidney disease    appt. /w urology 11/10/2014- for a cyst seen on US . evaluated was told it was nothing.   Diabetes mellitus    Diabetes II, oral and GLP-1.   DVT (deep venous thrombosis) (HCC)    right leg '09 or '10   Fibroids    GERD (gastroesophageal reflux disease)    Heart murmur    History of kidney stones    History of stress test    referred for cardiac cath- done here at [facility]- 09/2014   Hypercholesterolemia    Hypertension    Neuromuscular disorder (HCC)    neuropathy   PONV (postoperative nausea and vomiting)    Seizures (HCC)    02/01/23- last seizure approximately 2015;   Sleep apnea    using cpap as of 05/23/23   Stroke [facility])    '09-had blockage in brain, too deep to operate"occ left lower extremity fatique, occ. 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   Past Surgical History:  Procedure Laterality Date   APPENDECTOMY     BIOPSY  11/18/2021   Procedure: BIOPSY;  Surgeon: Urban Garden, MD;  [facility];  Service: Gastroenterology;;  random colon;   BIOPSY  12/27/2021   Procedure: BIOPSY;  Surgeon: Urban Garden, MD;  [facility];  Service: Gastroenterology;;   CARDIAC CATHETERIZATION     CESAREAN  SECTION  1986 & 1988   x2   CHOLECYSTECTOMY     open"gallstones"   COLONOSCOPY WITH PROPOFOL  N/A 11/18/2021   Procedure: COLONOSCOPY WITH PROPOFOL ;  Surgeon: Urban Garden, MD;  [facility];  Service: Gastroenterology;  Laterality: N/A;  1215   DILATATION & CURETTAGE/HYSTEROSCOPY WITH MYOSURE N/A 05/10/2017   Procedure: DILATATION & CURETTAGE/HYSTEROSCOPY WITH MYOSURE;  Surgeon: Ivery Marking, MD;  Location: [facility];  Service: Gynecology;  Laterality: N/A;   ESOPHAGOGASTRODUODENOSCOPY (EGD) WITH PROPOFOL  N/A 12/27/2021   Procedure: ESOPHAGOGASTRODUODENOSCOPY (  EGD) WITH PROPOFOL ;  Surgeon: Umberto Ganong, Bearl Limes, MD;  [facility];  Service: Gastroenterology;  Laterality: N/A;  100   ESOPHAGOGASTRODUODENOSCOPY (EGD) WITH PROPOFOL  N/A 01/13/2022   Procedure: ESOPHAGOGASTRODUODENOSCOPY (EGD) WITH PROPOFOL ;  Surgeon: Urban Garden, MD;  [facility];  Service: Gastroenterology;  Laterality: N/A;  235, per Hugo Maes pt knows to arrive at 6:15   HEMOSTASIS CLIP PLACEMENT  01/13/2022   Procedure: HEMOSTASIS CLIP PLACEMENT;  Surgeon: Urban Garden, MD;  [facility];  Service: Gastroenterology;;   INCISION AND DRAINAGE ABSCESS  02/23/2023   Procedure: INCISION AND DRAINAGE OF PERI RECTAL ABSCESS;  Surgeon: Melvenia Stabs, MD;  Location: [facility] ;  Service: General;;   INTERVENTIONAL RADIOLOGY PROCEDURE     CT angiography /neck -Dr. Alvira Josephs.   IR ANGIO INTRA EXTRACRAN SEL COM CAROTID INNOMINATE BILAT MOD SED  02/22/2018   IR ANGIO INTRA EXTRACRAN SEL COM CAROTID INNOMINATE BILAT MOD SED  03/08/2022   IR ANGIO INTRA EXTRACRAN SEL COM CAROTID INNOMINATE BILAT MOD SED  09/18/2023   IR ANGIO VERTEBRAL SEL SUBCLAVIAN INNOMINATE UNI L MOD SED  09/18/2023   IR ANGIO VERTEBRAL SEL VERTEBRAL BILAT MOD SED  02/22/2018   IR ANGIO VERTEBRAL SEL VERTEBRAL UNI R MOD SED  03/08/2022   IR ANGIO VERTEBRAL SEL VERTEBRAL  UNI R MOD SED  09/18/2023   IR GENERIC HISTORICAL  08/03/2016   IR RADIOLOGIST EVAL & MGMT 08/03/2016 [facility]   IR RADIOLOGIST EVAL & MGMT  01/03/2022   LAPAROSCOPIC GASTRIC SLEEVE RESECTION N/A 04/10/2016   Procedure: LAPAROSCOPIC GASTRIC SLEEVE RESECTION WITH UPPER ENDO;  Surgeon: Aldean Hummingbird, MD;  Location: [facility];  Service: General;  Laterality: N/A;   LEFT HEART CATHETERIZATION WITH CORONARY ANGIOGRAM N/A 09/28/2014   Procedure: LEFT HEART CATHETERIZATION WITH CORONARY ANGIOGRAM;  Surgeon: Lucendia Rusk, MD;  Location: [facility] CATH [facility];  Service: Cardiovascular;  Laterality: N/A;   LIGATION OF INTERNAL FISTULA TRACT N/A 05/24/2023   Procedure: LIGATION OF INTERSPHINCTERIC FISTULOUS TRACT;  Surgeon: Melvenia Stabs, MD;  Location: [facility];  Service: General;  Laterality: N/A;  90   NEPHROLITHOTOMY Right 02/01/2022   Procedure: RIGHT NEPHROLITHOTOMY PERCUTANEOUS WITH SURGEON ACCESS;  Surgeon: Andrez Banker, MD;  Location: [facility];  Service: Urology;  Laterality: Right;   NEPHROLITHOTOMY Left 02/15/2022   Procedure: LEFT NEPHROLITHOTOMY PERCUTANEOUS WITH SURGEON ACCESS, REMOVAL OF RIGHT URETERAL STENT;  Surgeon: Andrez Banker, MD;  Location: [facility];  Service: Urology;  Laterality: Left;  195 MINUTES   PHOTOCOAGULATION WITH LASER Bilateral 11/15/2023   Procedure: PHOTOCOAGULATION, EYE, USING LASER;  Surgeon: [name], MD;  Location: [facility] OR;  Service: Ophthalmology;  Laterality: Bilateral;   PLACEMENT OF SETON N/A 02/23/2023   Procedure: SURGICAL TREATMENT OF ANAL FISTULA-TRANSSPHINTERIL WITH PLACEMENT OF SETON;  Surgeon: Melvenia Stabs, MD;  Location: [facility];  Service: General;  Laterality: N/A;   POLYPECTOMY  01/13/2022   Procedure: POLYPECTOMY;  Surgeon: Urban Garden, MD;  [facility];  Service: Gastroenterology;;   RADIOLOGY WITH ANESTHESIA N/A 10/14/2014   Procedure: Neita Balo;  Surgeon: Luellen Sages,  MD;  Location: [facility];  Service: Radiology;  Laterality: N/A;   RECTAL EXAM UNDER ANESTHESIA N/A 05/24/2023   Procedure: ANORECTAL EXAM UNDER ANESTHESIA;  Surgeon: Melvenia Stabs, MD;  Location: [facility];  Service: General;  Laterality: N/A;   TUBAL LIGATION     FAMILY HISTORY Family History  Problem  Relation Age of Onset   Cataracts Mother    Kidney disease Mother        dialysis for 26 years   Heart attack Mother        2's   Diabetes Father    Cataracts Father    Heart failure Father    Heart disease Father    Heart attack Father        60's   Stroke Paternal Grandmother    SOCIAL HISTORY Social History   Tobacco Use   Smoking status: Every Day    Current packs/day: 0.25    Average packs/day: 0.3 packs/day for 48.4 years (12.1 ttl pk-yrs)    Types: Cigarettes    Start date: 07/25/1975    Passive exposure: Current   Smokeless tobacco: Never  Vaping Use   Vaping status: Never Used  Substance Use Topics   Alcohol use: Never   Drug use: No       OPHTHALMIC EXAM:  Base Eye Exam     Visual Acuity (Snellen - Linear)       Right Left   Dist cc 20/25 -2 20/80 -2   Dist ph cc  20/40    Correction: Glasses         Tonometry (Tonopen, 9:40 AM)       Right Left   Pressure 20 17         Pupils       Dark Light Shape React APD   Right 2 1 Round Brisk None   Left 2 1 Round Brisk None         Visual Fields (Counting fingers)       Left Right    Full Full         Extraocular Movement       Right Left    Full, Ortho Full, Ortho         Neuro/Psych     Oriented x3: Yes   Mood/Affect: Normal         Dilation     Both eyes: 1.0% Mydriacyl , 2.5% Phenylephrine  @ 9:40 AM           Slit Lamp and Fundus Exam     Slit Lamp Exam       Right Left   Lids/Lashes Dermatochalasis - upper lid Dermatochalasis - upper lid   Conjunctiva/Sclera mild melanosis mild melanosis   Cornea tear film debris tear film debris    Anterior Chamber deep and clear deep and clear   Iris Round and dilated, No NVI Round and dilated, No NVI   Lens 2+ Nuclear sclerosis, 2-3+ Cortical cataract 2+ Nuclear sclerosis, 2-3+ Cortical cataract, 2+ Posterior subcapsular cataract   Anterior Vitreous Vitreous syneresis, stable improvement in vitreous and subhyaloid heme, white vitreous condensations inferiorly -- persistent Vitreous syneresis         Fundus Exam       Right Left   Disc Pink and Sharp, no NVD Pink and Sharp, +cupping   C/D Ratio 0.5 0.65   Macula Flat, Good foveal reflex, RPE mottling and clumping, No heme or edema Flat, Good foveal reflex, RPE mottling and clumping, scattered MA/DBH -- improved, no frank edema   Vessels attenuated, mild tortuosity, scattered NVE IN midzone regressing attenuated, tortuous, early midzonal NVE -- regressing   Periphery Attached, boat shaped subhyaloid heme inferior midzone improved, scattered NVE IN midzone -- regressing, good PRP 360 Attached, scattered MA, good PRP changes 360 with with room for fill in temporally,  small subhyaloid heme temporally           Refraction     Wearing Rx       Sphere Cylinder Axis   Right -2.00 +0.50 178   Left -2.00 +0.75 014           IMAGING AND PROCEDURES  Imaging and Procedures for 12/28/2023  OCT, Retina - OU - Both Eyes        Right Eye Quality was good. Central Foveal Thickness: 200. Progression has been stable. Findings include normal foveal contour, no IRF, no SRF, intraretinal hyper-reflective material (Stable improvement in cystic changes ST mac, Partial PVD with stable improvement in vitreous opacities).   Left Eye Quality was good. Central Foveal Thickness: 223. Progression has been stable. Findings include normal foveal contour, no IRF, no SRF, vitreomacular adhesion (No DME, Mild diffuse atrophy, stable improvement in pre-retinal hyper reflective material along inferior arcades, stable improvement in vitreous opacities).    Notes  *Images captured and stored on drive  Diagnosis / Impression:  No DME OU OD: Stable improvement in cystic changes ST mac, Partial PVD with stable improvement in vitreous opacities OS: No DME, Mild diffuse atrophy, stable improvement in pre-retinal hyper reflective material along inferior arcades, stable improvement in vitreous opacities  Clinical management:  See below  Abbreviations: NFP - Normal foveal profile. CME - cystoid macular edema. PED - pigment epithelial detachment. IRF - intraretinal fluid. SRF - subretinal fluid. EZ - ellipsoid zone. ERM - epiretinal membrane. ORA - outer retinal atrophy. ORT - outer retinal tubulation. SRHM - subretinal hyper-reflective material. IRHM - intraretinal hyper-reflective material           ASSESSMENT/PLAN:   ICD-10-CM   1. 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 Subhyaloid hemorrhage of both eyes  H35.63     3. Long term (current) use of oral hypoglycemic drugs  Z79.84     4. Long-term (current) use of injectable non-insulin  antidiabetic drugs  Z79.85     5. Essential hypertension  I10     6. Hypertensive retinopathy of both eyes  H35.033     7. Combined forms of age-related cataract of both eyes  H25.813      1-4. Proliferative diabetic retinopathy w/o DME, OU - s/p IVA OD #1 (05.23.24), #2 (06.24.24), #3 (06.24.24), #4 (08.20.24), #5 (09.17.24), #6 (10.15.24), #7 (03.14.25),  - s/p IVA OS #1 (06.24.24), #2 (06.24.24), #3 (08.20.24), #4 (09.17.24), #5 (10.15.24), #6 (12.03.24) - s/p PRP OS (06.03.24) -- incomplete / aborted due to history of photogenic seizures - s/p PRP OU (04.25.25) -- done in the OR under GA  - mild VH improved OU - FA (05.23.24) shows OD: Boat shaped blockage inferior midzone from subhyaloid heme, focal NVE and vascular non-perfusion IN midzone; OS: Scattered punctate NVE  greatest inferior midzone -- pt needs PRP OU - repeat FA (07.23.24): OD: low fluorescein  signal, focal NVE and vascular non-perfusion IN midzone -- improved; OS: Low fluorescein  signal, punctate NVE greatest inferior midzone -- improved, new boat shaped heme inferior to disc - repeat FA (10.15.24) OD: focal NVE and vascular non-perfusion IN midzone -- improved w/ regression of NV, no leakage; OS: punctate NVE greatest inferior midzone -- resolved, boat shaped heme inferior to disc- resolved - repeat FA (03.14.25) shows OD: vascular non-perfusion with interval re-development of NVE IN midzone; OS: punctate NVE greatest inferior midzone --  slightly worse, boat shaped heme inferior to disc -- resolved **recurrent NVE with stoppage of anti-VEGF therapy** - s/p PRP OU (04.25.25) in OR due to h/o photogenic seizures -- good laser changes OU - no treatment recommended today - f/u 4 months, DFE, OCT, FA (transit OD)  5,6. Hypertensive retinopathy OU - discussed importance of tight BP control - monitor  7. Mixed Cataract OU - The symptoms of cataract, surgical options, and treatments and risks were discussed with patient. - discussed diagnosis and progression - under the expert management of Dr. Artemio Larry  8. Hx of photogenic seizures  Ophthalmic Meds Ordered this visit:  No orders of the defined types were placed in this encounter.    Return in about 4 months (around 04/28/2024) for f/u PDR OU, DFE, OCT, FA (transit OD).  There are no Patient Instructions on file for this visit.  This document serves as a record of services personally performed by Jeanice Millard, MD, PhD. It was created on their behalf by Eller Gut COT, an ophthalmic technician. The creation of this record is the provider's dictation and/or activities during the visit.    Electronically signed by: Eller Gut COT 06.05.25 12:53 PM  This document serves as a record of services personally performed by Jeanice Millard, MD, PhD. It was created on their behalf by Morley Arabia. Bevin Bucks, OA an ophthalmic technician. The creation of this record is the provider's dictation and/or activities during the visit.    Electronically signed by: Morley Arabia. Bevin Bucks, OA 12/28/23 12:53 PM  Jeanice Millard, M.D., Ph.D. Diseases & Surgery of the Retina and Vitreous [facility] 12/28/2023   I have reviewed the above documentation for accuracy and completeness, and I agree with the above. Jeanice Millard, M.D., Ph.D. 12/28/23 12:54 PM   Abbreviations: M myopia (nearsighted); A astigmatism; H hyperopia (farsighted); P presbyopia; Mrx spectacle prescription;  CTL contact lenses; OD right eye; OS left eye; OU both eyes  XT exotropia; ET esotropia; PEK punctate epithelial keratitis; PEE punctate epithelial erosions; DES dry eye syndrome; MGD meibomian gland dysfunction; ATs artificial tears; PFAT's preservative free artificial tears; NSC nuclear sclerotic cataract; PSC posterior subcapsular cataract; ERM epi-retinal membrane; PVD posterior vitreous detachment; RD retinal detachment; DM diabetes mellitus; DR diabetic retinopathy; NPDR non-proliferative diabetic retinopathy; PDR proliferative diabetic retinopathy; CSME clinically significant macular edema; DME diabetic macular edema; dbh dot blot hemorrhages; CWS cotton wool spot; POAG primary open angle glaucoma; C/D cup-to-disc ratio; HVF humphrey visual field; GVF goldmann visual field; OCT optical coherence tomography; IOP intraocular pressure; BRVO Branch retinal vein occlusion; CRVO central retinal vein occlusion; CRAO central retinal artery occlusion; BRAO branch retinal artery occlusion; RT retinal tear; SB scleral buckle; PPV pars plana vitrectomy; VH Vitreous hemorrhage; PRP panretinal laser photocoagulation; IVK intravitreal kenalog; VMT vitreomacular traction; MH Macular hole;  NVD neovascularization of the disc; NVE neovascularization elsewhere; AREDS age  related eye disease study; ARMD age related macular degeneration; POAG primary open angle glaucoma; EBMD epithelial/anterior basement membrane dystrophy; ACIOL anterior chamber intraocular lens; IOL intraocular lens; PCIOL posterior chamber intraocular lens; Phaco/IOL phacoemulsification with intraocular lens placement; PRK photorefractive keratectomy; LASIK laser assisted in situ keratomileusis; HTN hypertension; DM diabetes mellitus; COPD chronic obstructive pulmonary disease

## 2023-12-28 ENCOUNTER — Encounter (INDEPENDENT_AMBULATORY_CARE_PROVIDER_SITE_OTHER): Payer: Self-pay | Admitting: Ophthalmology

## 2023-12-28 ENCOUNTER — Other Ambulatory Visit (HOSPITAL_COMMUNITY): Payer: Self-pay | Admitting: Family Medicine

## 2023-12-28 ENCOUNTER — Ambulatory Visit (INDEPENDENT_AMBULATORY_CARE_PROVIDER_SITE_OTHER): Admitting: Ophthalmology

## 2023-12-28 DIAGNOSIS — H3563 Retinal hemorrhage, bilateral: Secondary | ICD-10-CM

## 2023-12-28 DIAGNOSIS — Z1231 Encounter for screening mammogram for malignant neoplasm of breast: Secondary | ICD-10-CM

## 2023-12-28 DIAGNOSIS — Z7984 Long term (current) use of oral hypoglycemic drugs: Secondary | ICD-10-CM

## 2023-12-28 DIAGNOSIS — H25813 Combined forms of age-related cataract, bilateral: Secondary | ICD-10-CM

## 2023-12-28 DIAGNOSIS — H35033 Hypertensive retinopathy, bilateral: Secondary | ICD-10-CM

## 2023-12-28 DIAGNOSIS — E113593 Type 2 diabetes mellitus with proliferative diabetic retinopathy without macular edema, bilateral: Secondary | ICD-10-CM

## 2023-12-28 DIAGNOSIS — I10 Essential (primary) hypertension: Secondary | ICD-10-CM

## 2023-12-28 DIAGNOSIS — Z7985 Long-term (current) use of injectable non-insulin antidiabetic drugs: Secondary | ICD-10-CM | POA: Diagnosis not present

## 2024-01-14 ENCOUNTER — Ambulatory Visit (HOSPITAL_COMMUNITY)
Admission: RE | Admit: 2024-01-14 | Discharge: 2024-01-14 | Disposition: A | Discharge status: Home / Self Care | Source: Ambulatory Visit | Attending: Family Medicine | Admitting: Family Medicine

## 2024-01-14 DIAGNOSIS — Z1231 Encounter for screening mammogram for malignant neoplasm of breast: Secondary | ICD-10-CM | POA: Insufficient documentation

## 2024-01-18 ENCOUNTER — Encounter (HOSPITAL_COMMUNITY): Payer: Self-pay | Admitting: Interventional Radiology

## 2024-01-28 ENCOUNTER — Telehealth: Payer: Self-pay | Admitting: Cardiology

## 2024-01-28 ENCOUNTER — Telehealth: Payer: Self-pay | Admitting: Neurology

## 2024-01-28 MED ORDER — LAMOTRIGINE 100 MG PO TABS: 100.0000 mg | ORAL_TABLET | Freq: Two times a day (BID) | ORAL | 3 refills | Status: AC

## 2024-01-28 NOTE — Telephone Encounter (Signed)
 Will forward to provider for clarification on directions for lasix .

## 2024-01-28 NOTE — Telephone Encounter (Signed)
 Pt states as a result of Dr Gregg increasing her to lamoTRIgine  (LAMICTAL ) 100 MG tablet  twice a day she has ran out, she needs a revised Rx sent to pharmacy-[facility]

## 2024-01-28 NOTE — Telephone Encounter (Signed)
 Refilled to [facility] as requested

## 2024-01-28 NOTE — Telephone Encounter (Signed)
 Pt c/o medication issue:  1. Name of Medication:   furosemide  (LASIX ) 20 MG tablet   2. How are you currently taking this medication (dosage and times per day)? As prescribed   3. Are you having a reaction (difficulty breathing--STAT)? No   4. What is your medication issue? Patient has been taking additional lasix  for swelling almost daily and reports she is out of the extra she has. She states she uses pill packs, unsure if prescription needs to be updated prior to refill to [facility]. Please advise.

## 2024-01-29 MED ORDER — FUROSEMIDE 20 MG PO TABS: ORAL_TABLET | ORAL | 1 refills | Status: DC

## 2024-01-29 NOTE — Telephone Encounter (Signed)
 Patient made aware, new rx sent to [facility].

## 2024-01-29 NOTE — Telephone Encounter (Signed)
 Her lasix  is listed as 20mg  every other day. Can write ok to take additional 20mg  prn as needed for swelling  JINNY Ross MD

## 2024-01-30 ENCOUNTER — Telehealth: Payer: Self-pay | Admitting: Cardiology

## 2024-01-30 NOTE — Telephone Encounter (Signed)
 Pt c/o medication issue:  1. Name of Medication:   furosemide  (LASIX ) 20 MG tablet   2. How are you currently taking this medication (dosage and times per day)?   3. Are you having a reaction (difficulty breathing--STAT)?   4. What is your medication issue?   Caller Sheliah) wants a call back to confirm instructions for this medication.  Caller noted patient is completely out of this medication.

## 2024-01-30 NOTE — Telephone Encounter (Signed)
 Spoke with pt who states that she is that she taking Lasix  20 daily and taking an extra Lasix  20 mg 4 days a week. Script is written for 20 mg of Lasix  every other day and 20 mg as needed for swelling.  Please advise.

## 2024-01-31 ENCOUNTER — Telehealth (HOSPITAL_COMMUNITY): Payer: Self-pay | Admitting: Interventional Radiology

## 2024-01-31 NOTE — Telephone Encounter (Signed)
 Returned call to pt concerning finding a new neuro IR doctor with Dr. Dolphus leaving. Left VM for her to call either myself or Rosina back to discuss further. JM

## 2024-01-31 NOTE — Telephone Encounter (Signed)
 I spoke with Grayce at [facility] and they have requested a current medication for list. They have a change in staffing as old pharmacist left and they are trying to have current documentation   So, going forward, they have given her refills for her pill pack for next month (August) and she will need to have refill sent for September. Grayce tells me patient had been taking lasix  daily instead of every other day as prescribed.  They are going to give her #15 tabs in her pill pack as she takes it every other day,with an additional 30 tabs in a bottle to use as needed for swelling.  I spoke with patient again this am and advised her to weigh herself daily and record weight and take additional lasix  20 mg for swelling.I asked to report back in a week with weights and to note how often she is using extra lasix .

## 2024-01-31 NOTE — Telephone Encounter (Signed)
 Pharmacist Quentin) called to follow-up on the status of patient's Lasik medication.  Caller noted patient is out of medication.

## 2024-02-07 ENCOUNTER — Other Ambulatory Visit: Payer: Self-pay | Admitting: Cardiology

## 2024-02-11 NOTE — Progress Notes (Signed)
 [facility] - Clinic Note  02/12/2024   CHIEF COMPLAINT Patient presents for Retina Follow Up  HISTORY OF PRESENT ILLNESS: Jill [name] is a 58 y.o. female who presents to the clinic today for:  HPI     Retina Follow Up   Patient presents with  Diabetic Retinopathy.  In both eyes.  This started 6.5 weeks ago.  I, the attending physician,  performed the HPI with the patient and updated documentation appropriately.        Comments   Patient here for 6.5 weeks retina follow up for PDR OU. Patient states vision having a lot of floaters. Started a week ago. Pretty good today. No eye pain. Dr Juli wanted patient seen before that visit.      Last edited by Valdemar Rogue, MD on 02/12/2024  5:05 PM.    Pt states she's had a chronic cough for a few months due to allergies. Coughing is improving. Dr. Darroll advised pt was seen before appt for cataract eval on 7.23.25 w/ Dr. Juli.   Referring physician: Juli Blunt, MD [address] [place],  [place] [address]  HISTORICAL INFORMATION:  Selected notes from the MEDICAL RECORD NUMBER Referred by Dr. Juli for diabetic retinopathy eval -- new subhyaloid heme OD LEE:  Ocular Hx- PMH-   CURRENT MEDICATIONS: No current outpatient medications on file. (Ophthalmic Drugs)   No current facility-administered medications for this visit. (Ophthalmic Drugs)   Current Outpatient Medications (Other)  Medication Sig   acetaminophen  (TYLENOL ) 500 MG tablet Take 500-1,000 mg by mouth every 8 (eight) hours as needed for moderate pain or headache.    aspirin  EC 81 MG tablet Take 81 mg by mouth every morning.   brompheniramine-pseudoephedrine-DM 30-2-10 MG/5ML syrup Take 5 mLs by mouth 4 (four) times daily as needed.   Calcium -Phosphorus-Vitamin D (CITRACAL +D3 PO) Take 2 tablets by mouth in the morning and at bedtime.   clopidogrel  (PLAVIX ) 75 MG tablet Take 75 mg by mouth in the morning.   Cyanocobalamin (VITAMIN B-12)  5000 MCG SUBL Place 5,000 mcg under the tongue 3 (three) times a week.   diazePAM , 20 MG Dose, (VALTOCO  20 MG DOSE) 2 x 10 MG/0.1ML LQPK Place 20 mg into the nose as needed (for seizure).   docusate sodium  (COLACE) 100 MG capsule Take 100 mg by mouth in the morning.   felodipine  (PLENDIL ) 5 MG 24 hr tablet Take 5 mg by mouth in the morning.   furosemide  (LASIX ) 20 MG tablet Take 20 mg by mouth every OTHER day.May take additional 20 mg as needed for swelling   gabapentin  (NEURONTIN ) 300 MG capsule Take 300 mg by mouth 2 (two) times daily.   lamoTRIgine  (LAMICTAL ) 100 MG tablet Take 1 tablet (100 mg total) by mouth 2 (two) times daily.   levETIRAcetam  (KEPPRA ) 500 MG tablet Take 250 mg by mouth 2 (two) times daily.   linaclotide (LINZESS) 145 MCG CAPS capsule Take 145 mcg by mouth daily before breakfast.   LORazepam  (ATIVAN ) 0.5 MG tablet Take 0.5 mg by mouth daily as needed for anxiety.   metFORMIN  (GLUCOPHAGE ) 500 MG tablet Take 500 mg by mouth 2 (two) times daily.   metoprolol  succinate (TOPROL -XL) 25 MG 24 hr tablet Take 1 tablet by mouth daily   Multiple Vitamins-Minerals (BARIATRIC MULTIVITAMINS/IRON PO) Take 1 tablet by mouth every evening.   olmesartan (BENICAR) 40 MG tablet Take 40 mg by mouth every morning.    OZEMPIC, 1 MG/DOSE, 2 MG/1.5ML SOPN  Inject 2 mg into the skin once a week.   pantoprazole  (PROTONIX ) 40 MG tablet Take 1 tablet (40 mg total) by mouth 2 (two) times daily.   pioglitazone  (ACTOS ) 30 MG tablet Take 30 mg by mouth every morning.   potassium chloride  (KLOR-CON  M) 10 MEQ tablet Take 2 tablets (20 mEq total) by mouth every other day. Take on days you take Lasix .   rosuvastatin  (CRESTOR ) 40 MG tablet Take 1 tablet (40 mg total) by mouth at bedtime.   ezetimibe  (ZETIA ) 10 MG tablet Take 1 tablet (10 mg total) by mouth daily.   No current facility-administered medications for this visit. (Other)   REVIEW OF SYSTEMS: ROS   Positive for: Gastrointestinal, Neurological,  Endocrine, Cardiovascular, Eyes Negative for: Constitutional, Skin, Genitourinary, Musculoskeletal, HENT, Respiratory, Psychiatric, Allergic/Imm, Heme/Lymph Last edited by Orval Asberry RAMAN, COA on 02/12/2024 12:56 PM.     ALLERGIES Allergies  Allergen Reactions   Dilaudid  [Hydromorphone  Hcl] Nausea And Vomiting   Crab Extract Swelling    Crab Cakes   Crab [Shellfish Allergy] Swelling   Hydrocodone      Don't like the way it feels   Other Other (See Comments)    pt is a difficult IV stick, prefers IV team paged for IV starts   PAST MEDICAL HISTORY Past Medical History:  Diagnosis Date   Anemia    Anxiety    Arthritis    Back pain    complains when laying on hard flat surface   Chronic kidney disease    appt. /w urology 11/10/2014- for a cyst seen on US . evaluated was told it was nothing.   Diabetes mellitus    Diabetes II, oral and GLP-1.   DVT (deep venous thrombosis) (HCC)    right leg '09 or '10   Fibroids    GERD (gastroesophageal reflux disease)    Heart murmur    History of kidney stones    History of stress test    referred for cardiac cath- done here at [facility]- 09/2014   Hypercholesterolemia    Hypertension    Neuromuscular disorder (HCC)    neuropathy   PONV (postoperative nausea and vomiting)    Seizures (HCC)    02/01/23- last seizure approximately 2015;   Sleep apnea    using cpap as of 05/23/23   Stroke [facility])    '09-had blockage in brain, too deep to operateocc left lower extremity fatique, occ. strangle, occ left mouth droopmild. may have a tendency to smile or cry with no reason has had 5 strokes last stroke 2014   Past Surgical History:  Procedure Laterality Date   APPENDECTOMY     BIOPSY  11/18/2021   Procedure: BIOPSY;  Surgeon: Eartha Angelia Sieving, MD;  [facility];  Service: Gastroenterology;;  random colon;   BIOPSY  12/27/2021   Procedure: BIOPSY;  Surgeon: Eartha Angelia Sieving, MD;  [facility];  Service:  Gastroenterology;;   CARDIAC CATHETERIZATION     CESAREAN SECTION  1986 & 1988   x2   CHOLECYSTECTOMY     opengallstones   COLONOSCOPY WITH PROPOFOL  N/A 11/18/2021   Procedure: COLONOSCOPY WITH PROPOFOL ;  Surgeon: Eartha Angelia Sieving, MD;  [facility]: [facility];  Service: Gastroenterology;  Laterality: N/A;  1215   DILATATION & CURETTAGE/HYSTEROSCOPY WITH MYOSURE N/A 05/10/2017   Procedure: DILATATION & CURETTAGE/HYSTEROSCOPY WITH MYOSURE;  Surgeon: Rutherford Gain, MD;  Location: [facility];  Service: Gynecology;  Laterality: N/A;   ESOPHAGOGASTRODUODENOSCOPY (EGD) WITH PROPOFOL  N/A 12/27/2021  Procedure: ESOPHAGOGASTRODUODENOSCOPY (EGD) WITH PROPOFOL ;  Surgeon: Eartha Angelia Sieving, MD;  [facility];  Service: Gastroenterology;  Laterality: N/A;  100   ESOPHAGOGASTRODUODENOSCOPY (EGD) WITH PROPOFOL  N/A 01/13/2022   Procedure: ESOPHAGOGASTRODUODENOSCOPY (EGD) WITH PROPOFOL ;  Surgeon: Eartha Angelia Sieving, MD;  [facility];  Service: Gastroenterology;  Laterality: N/A;  235, per Anette Caldron pt knows to arrive at 6:15   HEMOSTASIS CLIP PLACEMENT  01/13/2022   Procedure: HEMOSTASIS CLIP PLACEMENT;  Surgeon: Eartha Angelia Sieving, MD;  [facility];  Service: Gastroenterology;;   INCISION AND DRAINAGE ABSCESS  02/23/2023   Procedure: INCISION AND DRAINAGE OF PERI RECTAL ABSCESS;  Surgeon: Teresa Lonni HERO, MD;  Location: [facility];  Service: General;;   INTERVENTIONAL RADIOLOGY PROCEDURE     CT angiography /neck -Dr. Dolphus.   IR ANGIO INTRA EXTRACRAN SEL COM CAROTID INNOMINATE BILAT MOD SED  02/22/2018   IR ANGIO INTRA EXTRACRAN SEL COM CAROTID INNOMINATE BILAT MOD SED  03/08/2022   IR ANGIO INTRA EXTRACRAN SEL COM CAROTID INNOMINATE BILAT MOD SED  09/18/2023   IR ANGIO VERTEBRAL SEL SUBCLAVIAN INNOMINATE UNI L MOD SED  09/18/2023   IR ANGIO VERTEBRAL SEL VERTEBRAL BILAT MOD SED  02/22/2018   IR ANGIO VERTEBRAL SEL VERTEBRAL  UNI R MOD SED  03/08/2022   IR ANGIO VERTEBRAL SEL VERTEBRAL UNI R MOD SED  09/18/2023   IR GENERIC HISTORICAL  08/03/2016   IR RADIOLOGIST EVAL & MGMT 08/03/2016 [facility]   IR RADIOLOGIST EVAL & MGMT  01/03/2022   LAPAROSCOPIC GASTRIC SLEEVE RESECTION N/A 04/10/2016   Procedure: LAPAROSCOPIC GASTRIC SLEEVE RESECTION WITH UPPER ENDO;  Surgeon: Camellia Blush, MD;  Location: [facility];  Service: General;  Laterality: N/A;   LEFT HEART CATHETERIZATION WITH CORONARY ANGIOGRAM N/A 09/28/2014   Procedure: LEFT HEART CATHETERIZATION WITH CORONARY ANGIOGRAM;  Surgeon: Candyce GORMAN Reek, MD;  Location: [facility] CATH LAB;  Service: Cardiovascular;  Laterality: N/A;   LIGATION OF INTERNAL FISTULA TRACT N/A 05/24/2023   Procedure: LIGATION OF INTERSPHINCTERIC FISTULOUS TRACT;  Surgeon: Teresa Lonni HERO, MD;  Location: [facility];  Service: General;  Laterality: N/A;  90   NEPHROLITHOTOMY Right 02/01/2022   Procedure: RIGHT NEPHROLITHOTOMY PERCUTANEOUS WITH SURGEON ACCESS;  Surgeon: Cam Morene ORN, MD;  Location: [facility];  Service: Urology;  Laterality: Right;   NEPHROLITHOTOMY Left 02/15/2022   Procedure: LEFT NEPHROLITHOTOMY PERCUTANEOUS WITH SURGEON ACCESS, REMOVAL OF RIGHT URETERAL STENT;  Surgeon: Cam Morene ORN, MD;  Location: [facility];  Service: Urology;  Laterality: Left;  195 MINUTES   PHOTOCOAGULATION WITH LASER Bilateral 11/15/2023   Procedure: PHOTOCOAGULATION, EYE, USING LASER;  Surgeon: Valdemar Rogue, MD;  Location[facility] OR;  Service: Ophthalmology;  Laterality: Bilateral;   PLACEMENT OF SETON N/A 02/23/2023   Procedure: SURGICAL TREATMENT OF ANAL FISTULA-TRANSSPHINTERIL WITH PLACEMENT OF SETON;  Surgeon: Teresa Lonni HERO, MD;  Location: [facility];  Service: General;  Laterality: N/A;   POLYPECTOMY  01/13/2022   Procedure: POLYPECTOMY;  Surgeon: Eartha Angelia Sieving, MD;  [facility];  Service: Gastroenterology;;   RADIOLOGY WITH ANESTHESIA N/A  10/14/2014   Procedure: CYDNEY;  Surgeon: Thyra Dolphus, MD;  Location: [facility];  Service: Radiology;  Laterality: N/A;   RECTAL EXAM UNDER ANESTHESIA N/A 05/24/2023   Procedure: ANORECTAL EXAM UNDER ANESTHESIA;  Surgeon: Teresa Lonni HERO, MD;  Location: [facility];  Service: General;  Laterality: N/A;   TUBAL LIGATION     FAMILY HISTORY Family History  Problem Relation Age of Onset   Cataracts Mother    Kidney disease Mother        dialysis for 26 years   Heart attack Mother        79's   Diabetes Father    Cataracts Father    Heart failure Father    Heart disease Father    Heart attack Father        8's   Stroke Paternal Grandmother    SOCIAL HISTORY Social History   Tobacco Use   Smoking status: Every Day    Current packs/day: 0.25    Average packs/day: 0.3 packs/day for 48.6 years (12.1 ttl pk-yrs)    Types: Cigarettes    Start date: 07/25/1975    Passive exposure: Current   Smokeless tobacco: Never  Vaping Use   Vaping status: Never Used  Substance Use Topics   Alcohol use: Never   Drug use: No       OPHTHALMIC EXAM:  Base Eye Exam     Visual Acuity (Snellen - Linear)       Right Left   Dist cc 20/25 -2 20/100   Dist ph cc  20/60    Correction: Glasses         Tonometry (Tonopen, 12:52 PM)       Right Left   Pressure 15 19         Pupils       Dark Light Shape React APD   Right 2 1 Round Brisk None   Left 2 1 Round Brisk None         Visual Fields (Counting fingers)       Left Right    Full Full         Extraocular Movement       Right Left    Full, Ortho Full, Ortho         Neuro/Psych     Oriented x3: Yes   Mood/Affect: Normal         Dilation     Both eyes: 1.0% Mydriacyl , 2.5% Phenylephrine  @ 12:52 PM           Slit Lamp and Fundus Exam     Slit Lamp Exam       Right Left   Lids/Lashes Dermatochalasis - upper lid Dermatochalasis - upper lid   Conjunctiva/Sclera mild  melanosis mild melanosis   Cornea tear film debris tear film debris   Anterior Chamber deep and clear deep and clear   Iris Round and dilated, No NVI Round and dilated, No NVI   Lens 2+ Nuclear sclerosis, 2-3+ Cortical cataract 2+ Nuclear sclerosis, 2-3+ Cortical cataract, 2+ Posterior subcapsular cataract   Anterior Vitreous Vitreous syneresis, stable improvement in vitreous and subhyaloid heme, white vitreous condensations inferiorly -- persistent Vitreous syneresis, mild blood stained vit condensations. New subhyaloid heme inf to disc         Fundus Exam       Right Left   Disc Pink and Sharp, no NVD Pink and Sharp, +cupping   C/D Ratio 0.5 0.65   Macula Flat, Good foveal reflex, RPE mottling and clumping, No heme or edema Flat, Good foveal reflex, RPE mottling and clumping, scattered MA/DBH -- improved, no frank edema   Vessels attenuated, mild tortuosity, scattered NVE IN midzone regressing attenuated, tortuous, early midzonal NVE -- regressing   Periphery Attached, boat shaped subhyaloid heme inferior midzone improved, scattered NVE IN midzone -- regressing, good PRP 360 Attached, scattered MA,  good PRP changes 360 w/ room for fill in temporally.            Refraction     Wearing Rx       Sphere Cylinder Axis   Right -2.00 +0.50 178   Left -2.00 +0.75 014           IMAGING AND PROCEDURES  Imaging and Procedures for 02/12/2024  OCT, Retina - OU - Both Eyes       Right Eye Quality was good. Central Foveal Thickness: 215. Progression has been stable. Findings include normal foveal contour, no IRF, no SRF, intraretinal hyper-reflective material (Stable improvement in cystic changes ST mac, Partial PVD with stable improvement in vitreous opacities).   Left Eye Quality was good. Central Foveal Thickness: 223. Progression has worsened. Findings include normal foveal contour, no IRF, no SRF, vitreomacular adhesion (No DME, Mild diffuse atrophy, interval increase in  vitreous opacities--new heme. Partial PVD).   Notes *Images captured and stored on drive  Diagnosis / Impression:  No DME OU OD: Stable improvement in cystic changes ST mac, Partial PVD with stable improvement in vitreous opacities OS: No DME, Mild diffuse atrophy, interval increase in vitreous opacities--new heme. Partial PVD  Clinical management:  See below  Abbreviations: NFP - Normal foveal profile. CME - cystoid macular edema. PED - pigment epithelial detachment. IRF - intraretinal fluid. SRF - subretinal fluid. EZ - ellipsoid zone. ERM - epiretinal membrane. ORA - outer retinal atrophy. ORT - outer retinal tubulation. SRHM - subretinal hyper-reflective material. IRHM - intraretinal hyper-reflective material      Intravitreal Injection, Pharmacologic Agent - OS - Left Eye       Time Out 02/12/2024. 1:46 PM. Confirmed correct patient, procedure, site, and patient consented.   Anesthesia Topical anesthesia was used. Anesthetic medications included Lidocaine  2%, Proparacaine  0.5%.   Procedure Preparation included 5% betadine to ocular surface, eyelid speculum. A (32g) needle was used.   Injection: 1.25 mg Bevacizumab  1.25mg /0.40ml   Route: Intravitreal, Site: Left Eye   NDC: [id number], Lot: [id number], Expiration date: 03/10/2024   Post-op Post injection exam found visual acuity of at least counting fingers. The patient tolerated the procedure well. There were no complications. The patient received written and verbal post procedure care education.            ASSESSMENT/PLAN:   ICD-10-CM   1. 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 Subhyaloid hemorrhage of both eyes  H35.63 Intravitreal Injection, Pharmacologic Agent - OS - Left Eye    Bevacizumab   (AVASTIN ) SOLN 1.25 mg    3. Long term (current) use of oral hypoglycemic drugs  Z79.84     4. Long-term (current) use of injectable non-insulin  antidiabetic drugs  Z79.85     5. Essential hypertension  I10     6. Hypertensive retinopathy of both eyes  H35.033     7. Combined forms of age-related cataract of both eyes  H25.813     8. Convulsions, unspecified convulsion type (HCC)  R56.9      1-4. Proliferative diabetic retinopathy w/o DME, OU -- new VH OD today (07.22.25) - s/p IVA OD #1 (05.23.24), #2 (06.24.24), #3 (06.24.24), #4 (08.20.24), #5 (09.17.24), #6 (10.15.24), #7 (03.14.25),  - s/p IVA OS #1 (06.24.24), #  2 (06.24.24), #3 (08.20.24), #4 (09.17.24), #5 (10.15.24), #6 (12.03.24) - s/p PRP OS (06.03.24) -- incomplete / aborted due to history of photogenic seizures - s/p PRP OU (04.25.25) -- done in the OR under [facility]  - FA (05.23.24) shows OD: Boat shaped blockage inferior midzone from subhyaloid heme, focal NVE and vascular non-perfusion IN midzone; OS: Scattered punctate NVE greatest inferior midzone -- pt needs PRP OU - repeat FA (07.23.24): OD: low fluorescein  signal, focal NVE and vascular non-perfusion IN midzone -- improved; OS: Low fluorescein  signal, punctate NVE greatest inferior midzone -- improved, new boat shaped heme inferior to disc - repeat FA (10.15.24) OD: focal NVE and vascular non-perfusion IN midzone -- improved w/ regression of NV, no leakage; OS: punctate NVE greatest inferior midzone -- resolved, boat shaped heme inferior to disc- resolved - repeat FA (03.14.25) shows OD: vascular non-perfusion with interval re-development of NVE IN midzone; OS: punctate NVE greatest inferior midzone -- slightly worse, boat shaped heme inferior to disc -- resolved **recurrent NVE with stoppage of anti-VEGF therapy** - s/p PRP OU (04.25.25) in OR due to h/o photogenic seizures -- good laser changes OU - today, pt reports 1 wk history of floaters OS following episodes of severe  coughing -- exam shows new VH and subhyaloid heme OS - BCVA OS 20/60 from 20/40 - recommend IVA OS #7 today for new heme OS.  - RBA of procedure discussed, questions answered - informed consent obtained and signed - see procedure note  - f/u 4 weeks, DFE, OCT, FA (transit OD)  5,6. Hypertensive retinopathy OU - discussed importance of tight BP control - monitor  7. Mixed Cataract OU - The symptoms of cataract, surgical options, and treatments and risks were discussed with patient. - discussed diagnosis and progression - under the expert management of Dr. Juli  8. Hx of photogenic seizures  Ophthalmic Meds Ordered this visit:  Meds ordered this encounter  Medications   Bevacizumab  (AVASTIN ) SOLN 1.25 mg     Return in about 4 weeks (around 03/11/2024) for PDR OU, DFE, OCT, FA trans OD.  There are no Patient Instructions on file for this visit.  This document serves as a record of services personally performed by Redell JUDITHANN Hans, MD, PhD. It was created on their behalf by Auston Muzzy, COMT. The creation of this record is the provider's dictation and/or activities during the visit.  Electronically signed by: Auston Muzzy, COMT 02/12/24 5:07 PM   This document serves as a record of services personally performed by Redell JUDITHANN Hans, MD, PhD. It was created on their behalf by Almetta Pesa, an ophthalmic technician. The creation of this record is the provider's dictation and/or activities during the visit.    Electronically signed by: Almetta Pesa, OA, 02/12/24  5:07 PM  Redell JUDITHANN Hans, M.D., Ph.D. Diseases & Surgery of the Retina and Vitreous [facility]  I have reviewed the above documentation for accuracy and completeness, and I agree with the above. Redell JUDITHANN Hans, M.D., Ph.D. 02/12/24 5:10 PM   Abbreviations: M myopia (nearsighted); A astigmatism; H hyperopia (farsighted); P presbyopia; Mrx spectacle prescription;  CTL contact lenses; OD  right eye; OS left eye; OU both eyes  XT exotropia; ET esotropia; PEK punctate epithelial keratitis; PEE punctate epithelial erosions; DES dry eye syndrome; MGD meibomian gland dysfunction; ATs artificial tears; PFAT's preservative free artificial tears; NSC nuclear sclerotic cataract; PSC posterior subcapsular cataract; ERM epi-retinal membrane; PVD posterior vitreous detachment; RD retinal detachment; DM diabetes  mellitus; DR diabetic retinopathy; NPDR non-proliferative diabetic retinopathy; PDR proliferative diabetic retinopathy; CSME clinically significant macular edema; DME diabetic macular edema; dbh dot blot hemorrhages; CWS cotton wool spot; POAG primary open angle glaucoma; C/D cup-to-disc ratio; HVF humphrey visual field; GVF goldmann visual field; OCT optical coherence tomography; IOP intraocular pressure; BRVO Branch retinal vein occlusion; CRVO central retinal vein occlusion; CRAO central retinal artery occlusion; BRAO branch retinal artery occlusion; RT retinal tear; SB scleral buckle; PPV pars plana vitrectomy; VH Vitreous hemorrhage; PRP panretinal laser photocoagulation; IVK intravitreal kenalog; VMT vitreomacular traction; MH Macular hole;  NVD neovascularization of the disc; NVE neovascularization elsewhere; AREDS age related eye disease study; ARMD age related macular degeneration; POAG primary open angle glaucoma; EBMD epithelial/anterior basement membrane dystrophy; ACIOL anterior chamber intraocular lens; IOL intraocular lens; PCIOL posterior chamber intraocular lens; Phaco/IOL phacoemulsification with intraocular lens placement; PRK photorefractive keratectomy; LASIK laser assisted in situ keratomileusis; HTN hypertension; DM diabetes mellitus; COPD chronic obstructive pulmonary disease

## 2024-02-12 ENCOUNTER — Ambulatory Visit (INDEPENDENT_AMBULATORY_CARE_PROVIDER_SITE_OTHER): Admitting: Ophthalmology

## 2024-02-12 ENCOUNTER — Encounter (INDEPENDENT_AMBULATORY_CARE_PROVIDER_SITE_OTHER): Payer: Self-pay | Admitting: Ophthalmology

## 2024-02-12 DIAGNOSIS — Z7985 Long-term (current) use of injectable non-insulin antidiabetic drugs: Secondary | ICD-10-CM

## 2024-02-12 DIAGNOSIS — R569 Unspecified convulsions: Secondary | ICD-10-CM

## 2024-02-12 DIAGNOSIS — Z7984 Long term (current) use of oral hypoglycemic drugs: Secondary | ICD-10-CM

## 2024-02-12 DIAGNOSIS — H25813 Combined forms of age-related cataract, bilateral: Secondary | ICD-10-CM

## 2024-02-12 DIAGNOSIS — E113593 Type 2 diabetes mellitus with proliferative diabetic retinopathy without macular edema, bilateral: Secondary | ICD-10-CM

## 2024-02-12 DIAGNOSIS — H3563 Retinal hemorrhage, bilateral: Secondary | ICD-10-CM

## 2024-02-12 DIAGNOSIS — I10 Essential (primary) hypertension: Secondary | ICD-10-CM

## 2024-02-12 DIAGNOSIS — H35033 Hypertensive retinopathy, bilateral: Secondary | ICD-10-CM

## 2024-02-12 MED ORDER — BEVACIZUMAB CHEMO INJECTION 1.25MG/0.05ML SYRINGE FOR KALEIDOSCOPE
1.2500 mg | INTRAVITREAL | Status: AC | PRN
  Administered 2024-02-12: 1.25 mg via INTRAVITREAL

## 2024-02-27 ENCOUNTER — Ambulatory Visit (INDEPENDENT_AMBULATORY_CARE_PROVIDER_SITE_OTHER): Admitting: Orthopedic Surgery

## 2024-02-27 DIAGNOSIS — M5416 Radiculopathy, lumbar region: Secondary | ICD-10-CM

## 2024-02-27 NOTE — Progress Notes (Signed)
 Orthopedic Office Note  This is a patient that I have previously seen in the office for low back pain that radiates into the left lower extremity.  This pain started after she slipped and fell when it was wet outside.  She did not have any pain prior to this accident.  Her pain is felt in the low back radiating into the lateral thigh and leg.  She feels the pain with activity and rest.  It sometimes gives her difficulty sleeping at night.  She feels that both in the back and the leg.  Since our last visit, she has been to pain management and has been using different medications to try and control the pain.  She has had to make multiple modifications as she has had issues with some of the medications causing side effects.  On exam, she is in no acute distress.  Her breathing is unlabored on room air.  She has 4-5 strength with EHL bilaterally and 4-5 left hip flexion.  Otherwise, she has 5 out of 5 strength in bilateral lower extremities in all myotomes.  Sensations intact to light touch in L3-S1 distributions.  I spent some time covering her options again with her.  She has tried multiple conservative treatments including pain management.  She has not noticed relief with any of these treatments.  Accordingly, I told her her options would be to continue with pain management, try an injection, or go the surgical route.  She is not currently a candidate for surgery since she is using nicotine .  I would also need a A1c at 7.5 or less before any elective surgery.  So, at this time, I told her her actual options would be continue with pain management or try an injection.  She wants to continue with pain management and try an injection.  Referral was provided to Dr. Lyda office for a left L4/5 transforaminal injection.  She can follow-up with me on an as-needed basis.  Ozell DELENA Ada, MD Orthopedic Surgeon

## 2024-03-04 ENCOUNTER — Other Ambulatory Visit: Payer: Self-pay | Admitting: Physical Medicine and Rehabilitation

## 2024-03-04 ENCOUNTER — Telehealth: Payer: Self-pay

## 2024-03-04 MED ORDER — DIAZEPAM 5 MG PO TABS: ORAL_TABLET | ORAL | 0 refills | Status: DC

## 2024-03-04 NOTE — Telephone Encounter (Signed)
 Patient requesting pre medication for injection on 03/31/2024

## 2024-03-05 ENCOUNTER — Telehealth: Payer: Self-pay | Admitting: Pulmonary Disease

## 2024-03-05 ENCOUNTER — Other Ambulatory Visit: Payer: Self-pay | Admitting: Cardiology

## 2024-03-05 NOTE — Progress Notes (Shared)
 [facility] - Clinic Note  03/17/2024   CHIEF COMPLAINT Patient presents for No chief complaint on file.  HISTORY OF PRESENT ILLNESS: Jill [name] is a 58 y.o. female who presents to the clinic today for:   Pt states she's had a chronic cough for a few months due to allergies. Coughing is improving. Dr. Darroll advised pt was seen before appt for cataract eval on 7.23.25 w/ Dr. Juli.   Referring physician: Juli Blunt, MD [address] [place],  [place] [address]  HISTORICAL INFORMATION:  Selected notes from the MEDICAL RECORD NUMBER Referred by Dr. Juli for diabetic retinopathy eval -- new subhyaloid heme OD LEE:  Ocular Hx- PMH-   CURRENT MEDICATIONS: No current outpatient medications on file. (Ophthalmic Drugs)   No current facility-administered medications for this visit. (Ophthalmic Drugs)   Current Outpatient Medications (Other)  Medication Sig   acetaminophen  (TYLENOL ) 500 MG tablet Take 500-1,000 mg by mouth every 8 (eight) hours as needed for moderate pain or headache.    aspirin  EC 81 MG tablet Take 81 mg by mouth every morning.   brompheniramine-pseudoephedrine-DM 30-2-10 MG/5ML syrup Take 5 mLs by mouth 4 (four) times daily as needed.   Calcium -Phosphorus-Vitamin D (CITRACAL +D3 PO) Take 2 tablets by mouth in the morning and at bedtime.   clopidogrel  (PLAVIX ) 75 MG tablet Take 75 mg by mouth in the morning.   Cyanocobalamin (VITAMIN B-12) 5000 MCG SUBL Place 5,000 mcg under the tongue 3 (three) times a week.   diazepam  (VALIUM ) 5 MG tablet Take one tablet by mouth with light food one hour prior to procedure.   diazePAM , 20 MG Dose, (VALTOCO  20 MG DOSE) 2 x 10 MG/0.1ML LQPK Place 20 mg into the nose as needed (for seizure).   docusate sodium  (COLACE) 100 MG capsule Take 100 mg by mouth in the morning.   ezetimibe  (ZETIA ) 10 MG tablet Take 1 tablet (10 mg total) by mouth daily.   felodipine  (PLENDIL ) 5 MG 24 hr tablet Take 5 mg by mouth  in the morning.   furosemide  (LASIX ) 20 MG tablet Take 20 mg by mouth every OTHER day.May take additional 20 mg as needed for swelling   gabapentin  (NEURONTIN ) 300 MG capsule Take 300 mg by mouth 2 (two) times daily.   lamoTRIgine  (LAMICTAL ) 100 MG tablet Take 1 tablet (100 mg total) by mouth 2 (two) times daily.   levETIRAcetam  (KEPPRA ) 500 MG tablet Take 250 mg by mouth 2 (two) times daily.   linaclotide (LINZESS) 145 MCG CAPS capsule Take 145 mcg by mouth daily before breakfast.   LORazepam  (ATIVAN ) 0.5 MG tablet Take 0.5 mg by mouth daily as needed for anxiety.   metFORMIN  (GLUCOPHAGE ) 500 MG tablet Take 500 mg by mouth 2 (two) times daily.   metoprolol  succinate (TOPROL -XL) 25 MG 24 hr tablet Take 1 tablet by mouth daily   Multiple Vitamins-Minerals (BARIATRIC MULTIVITAMINS/IRON PO) Take 1 tablet by mouth every evening.   olmesartan (BENICAR) 40 MG tablet Take 40 mg by mouth every morning.    OZEMPIC, 1 MG/DOSE, 2 MG/1.5ML SOPN Inject 2 mg into the skin once a week.   pantoprazole  (PROTONIX ) 40 MG tablet Take 1 tablet (40 mg total) by mouth 2 (two) times daily.   pioglitazone  (ACTOS ) 30 MG tablet Take 30 mg by mouth every morning.   potassium chloride  (KLOR-CON  M) 10 MEQ tablet Take 2 tablets (20 mEq total) by mouth every other day. Take on days you take Lasix .  rosuvastatin  (CRESTOR ) 40 MG tablet Take 1 tablet (40 mg total) by mouth at bedtime.   No current facility-administered medications for this visit. (Other)   REVIEW OF SYSTEMS:   ALLERGIES Allergies  Allergen Reactions   Dilaudid  [Hydromorphone  Hcl] Nausea And Vomiting   Crab Extract Swelling    Crab Cakes   Crab [Shellfish Allergy] Swelling   Hydrocodone      Don't like the way it feels   Other Other (See Comments)    pt is a difficult IV stick, prefers IV team paged for IV starts   PAST MEDICAL HISTORY Past Medical History:  Diagnosis Date   Anemia    Anxiety    Arthritis    Back pain    complains when  laying on hard flat surface   Chronic kidney disease    appt. /w urology 11/10/2014- for a cyst seen on US . evaluated was told it was nothing.   Diabetes mellitus    Diabetes II, oral and GLP-1.   DVT (deep venous thrombosis) (HCC)    right leg '09 or '10   Fibroids    GERD (gastroesophageal reflux disease)    Heart murmur    History of kidney stones    History of stress test    referred for cardiac cath- done here at [facility]- 09/2014   Hypercholesterolemia    Hypertension    Neuromuscular disorder (HCC)    neuropathy   PONV (postoperative nausea and vomiting)    Seizures (HCC)    02/01/23- last seizure approximately 2015;   Sleep apnea    using cpap as of 05/23/23   Stroke [facility])    '09-had blockage in brain, too deep to operateocc left lower extremity fatique, occ. strangle, occ left mouth droopmild. may have a tendency to smile or cry with no reason has had 5 strokes last stroke 2014   Past Surgical History:  Procedure Laterality Date   APPENDECTOMY     BIOPSY  11/18/2021   Procedure: BIOPSY;  Surgeon: Eartha Angelia Sieving, MD;  [facility];  Service: Gastroenterology;;  random colon;   BIOPSY  12/27/2021   Procedure: BIOPSY;  Surgeon: Eartha Angelia Sieving, MD;  [facility];  Service: Gastroenterology;;   CARDIAC CATHETERIZATION     CESAREAN SECTION  1986 & 1988   x2   CHOLECYSTECTOMY     opengallstones   COLONOSCOPY WITH PROPOFOL  N/A 11/18/2021   Procedure: COLONOSCOPY WITH PROPOFOL ;  Surgeon: Eartha Angelia Sieving, MD;  [facility];  Service: Gastroenterology;  Laterality: N/A;  1215   DILATATION & CURETTAGE/HYSTEROSCOPY WITH MYOSURE N/A 05/10/2017   Procedure: DILATATION & CURETTAGE/HYSTEROSCOPY WITH MYOSURE;  Surgeon: Rutherford Gain, MD;  Location: [facility];  Service: Gynecology;  Laterality: N/A;   ESOPHAGOGASTRODUODENOSCOPY (EGD) WITH PROPOFOL  N/A 12/27/2021   Procedure: ESOPHAGOGASTRODUODENOSCOPY (EGD) WITH PROPOFOL ;   Surgeon: Eartha Angelia Sieving, MD;  [facility];  Service: Gastroenterology;  Laterality: N/A;  100   ESOPHAGOGASTRODUODENOSCOPY (EGD) WITH PROPOFOL  N/A 01/13/2022   Procedure: ESOPHAGOGASTRODUODENOSCOPY (EGD) WITH PROPOFOL ;  Surgeon: Eartha Angelia Sieving, MD;  [facility];  Service: Gastroenterology;  Laterality: N/A;  235, per Anette Caldron pt knows to arrive at 6:15   HEMOSTASIS CLIP PLACEMENT  01/13/2022   Procedure: HEMOSTASIS CLIP PLACEMENT;  Surgeon: Eartha Angelia Sieving, MD;  [facility];  Service: Gastroenterology;;   INCISION AND DRAINAGE ABSCESS  02/23/2023   Procedure: INCISION AND DRAINAGE OF PERI RECTAL ABSCESS;  Surgeon: Teresa Lonni HERO, MD;  Location: [facility];  Service: General;;   INTERVENTIONAL RADIOLOGY PROCEDURE     CT angiography /neck -Dr. Dolphus.   IR ANGIO INTRA EXTRACRAN SEL COM CAROTID INNOMINATE BILAT MOD SED  02/22/2018   IR ANGIO INTRA EXTRACRAN SEL COM CAROTID INNOMINATE BILAT MOD SED  03/08/2022   IR ANGIO INTRA EXTRACRAN SEL COM CAROTID INNOMINATE BILAT MOD SED  09/18/2023   IR ANGIO VERTEBRAL SEL SUBCLAVIAN INNOMINATE UNI L MOD SED  09/18/2023   IR ANGIO VERTEBRAL SEL VERTEBRAL BILAT MOD SED  02/22/2018   IR ANGIO VERTEBRAL SEL VERTEBRAL UNI R MOD SED  03/08/2022   IR ANGIO VERTEBRAL SEL VERTEBRAL UNI R MOD SED  09/18/2023   IR GENERIC HISTORICAL  08/03/2016   IR RADIOLOGIST EVAL & MGMT 08/03/2016 [facility]   IR RADIOLOGIST EVAL & MGMT  01/03/2022   LAPAROSCOPIC GASTRIC SLEEVE RESECTION N/A 04/10/2016   Procedure: LAPAROSCOPIC GASTRIC SLEEVE RESECTION WITH UPPER ENDO;  Surgeon: Camellia Blush, MD;  Location: [facility];  Service: General;  Laterality: N/A;   LEFT HEART CATHETERIZATION WITH CORONARY ANGIOGRAM N/A 09/28/2014   Procedure: LEFT HEART CATHETERIZATION WITH CORONARY ANGIOGRAM;  Surgeon: Candyce GORMAN Reek, MD;  Location: [facility] CATH [facility];  Service: Cardiovascular;  Laterality: N/A;   LIGATION OF INTERNAL  FISTULA TRACT N/A 05/24/2023   Procedure: LIGATION OF INTERSPHINCTERIC FISTULOUS TRACT;  Surgeon: Teresa Lonni HERO, MD;  Location: [facility];  Service: General;  Laterality: N/A;  90   NEPHROLITHOTOMY Right 02/01/2022   Procedure: RIGHT NEPHROLITHOTOMY PERCUTANEOUS WITH SURGEON ACCESS;  Surgeon: Cam Morene ORN, MD;  Location: [facility];  Service: Urology;  Laterality: Right;   NEPHROLITHOTOMY Left 02/15/2022   Procedure: LEFT NEPHROLITHOTOMY PERCUTANEOUS WITH SURGEON ACCESS, REMOVAL OF RIGHT URETERAL STENT;  Surgeon: Cam Morene ORN, MD;  Location: [facility];  Service: Urology;  Laterality: Left;  195 MINUTES   PHOTOCOAGULATION WITH LASER Bilateral 11/15/2023   Procedure: PHOTOCOAGULATION, EYE, USING LASER;  Surgeon: Valdemar Rogue, MD;  Location: [facility];  Service: Ophthalmology;  Laterality: Bilateral;   PLACEMENT OF SETON N/A 02/23/2023   Procedure: SURGICAL TREATMENT OF ANAL FISTULA-TRANSSPHINTERIL WITH PLACEMENT OF SETON;  Surgeon: Teresa Lonni HERO, MD;  Location: [facility];  Service: General;  Laterality: N/A;   POLYPECTOMY  01/13/2022   Procedure: POLYPECTOMY;  Surgeon: Eartha Angelia Sieving, MD;  [facility];  Service: Gastroenterology;;   RADIOLOGY WITH ANESTHESIA N/A 10/14/2014   Procedure: CYDNEY;  Surgeon: Thyra Dolphus, MD;  Location: [facility];  Service: Radiology;  Laterality: N/A;   RECTAL EXAM UNDER ANESTHESIA N/A 05/24/2023   Procedure: ANORECTAL EXAM UNDER ANESTHESIA;  Surgeon: Teresa Lonni HERO, MD;  Location: [facility];  Service: General;  Laterality: N/A;   TUBAL LIGATION     FAMILY HISTORY Family History  Problem Relation Age of Onset   Cataracts Mother    Kidney disease Mother        dialysis for 26 years   Heart attack Mother        48's   Diabetes Father    Cataracts Father    Heart failure Father    Heart disease Father    Heart attack Father        2's   Stroke Paternal Grandmother     SOCIAL HISTORY Social History   Tobacco Use   Smoking status: Every Day    Current packs/day: 0.25    Average packs/day: 0.3 packs/day for 48.6 years (12.2 ttl pk-yrs)  Types: Cigarettes    Start date: 07/25/1975    Passive exposure: Current   Smokeless tobacco: Never  Vaping Use   Vaping status: Never Used  Substance Use Topics   Alcohol use: Never   Drug use: No       OPHTHALMIC EXAM:  Not recorded    IMAGING AND PROCEDURES  Imaging and Procedures for 03/17/2024          ASSESSMENT/PLAN:   ICD-10-CM   1. 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 Subhyaloid hemorrhage of both eyes  H35.63     3. Long term (current) use of oral hypoglycemic drugs  Z79.84     4. Long-term (current) use of injectable non-insulin  antidiabetic drugs  Z79.85     5. Essential hypertension  I10     6. Hypertensive retinopathy of both eyes  H35.033     7. Combined forms of age-related cataract of both eyes  H25.813     8. Convulsions, unspecified convulsion type (HCC)  R56.9       1-4. Proliferative diabetic retinopathy w/o DME, OU -- new VH OD today (07.22.25) - s/p IVA OD #1 (05.23.24), #2 (06.24.24), #3 (06.24.24), #4 (08.20.24), #5 (09.17.24), #6 (10.15.24), #7 (03.14.25),  - s/p IVA OS #1 (06.24.24), #2 (06.24.24), #3 (08.20.24), #4 (09.17.24), #5 (10.15.24), #6 (12.03.24), #7 (07.22.25) - s/p PRP OS (06.03.24) -- incomplete / aborted due to history of photogenic seizures - s/p PRP OU (04.25.25) -- done in the OR under [facility]  - FA (05.23.24) shows OD: Boat shaped blockage inferior midzone from subhyaloid heme, focal NVE and vascular non-perfusion IN midzone; OS: Scattered punctate NVE greatest inferior midzone -- pt needs PRP OU - repeat FA (07.23.24): OD: low fluorescein  signal, focal NVE and vascular non-perfusion IN midzone -- improved; OS: Low fluorescein  signal, punctate NVE  greatest inferior midzone -- improved, new boat shaped heme inferior to disc - repeat FA (10.15.24) OD: focal NVE and vascular non-perfusion IN midzone -- improved w/ regression of NV, no leakage; OS: punctate NVE greatest inferior midzone -- resolved, boat shaped heme inferior to disc- resolved - repeat FA (03.14.25) shows OD: vascular non-perfusion with interval re-development of NVE IN midzone; OS: punctate NVE greatest inferior midzone -- slightly worse, boat shaped heme inferior to disc -- resolved **recurrent NVE with stoppage of anti-VEGF therapy** - s/p PRP OU (04.25.25) in OR due to h/o photogenic seizures -- good laser changes OU - today, pt reports 1 wk history of floaters OS following episodes of severe coughing -- exam shows new VH and subhyaloid heme OS - BCVA OS 20/60 from 20/40 - recommend IVA today OU #8 (08.25.25)  - RBA of procedure discussed, questions answered - informed consent obtained and signed - see procedure note  - f/u 4 weeks, DFE, OCT, FA (transit OD)  5,6. Hypertensive retinopathy OU - discussed importance of tight BP control - monitor  7. Mixed Cataract OU - The symptoms of cataract, surgical options, and treatments and risks were discussed with patient. - discussed diagnosis and progression - under the expert management of Dr. Juli  8. Hx of photogenic seizures  Ophthalmic Meds Ordered this visit:  No orders of the defined types were placed in this encounter.    No follow-ups on file.  There are no Patient Instructions on file for this visit.  This document serves as a record of services personally performed by Redell MATSU.  Valdemar, MD, PhD. It was created on their behalf by Avelina Pereyra, COA an ophthalmic technician. The creation of this record is the provider's dictation and/or activities during the visit.   Electronically signed by: Avelina GORMAN Pereyra, COT  03/05/24  11:10 AM       Redell JUDITHANN Valdemar, M.D., Ph.D. Diseases & Surgery of the Retina  and Vitreous [facility]    Abbreviations: M myopia (nearsighted); A astigmatism; H hyperopia (farsighted); P presbyopia; Mrx spectacle prescription;  CTL contact lenses; OD right eye; OS left eye; OU both eyes  XT exotropia; ET esotropia; PEK punctate epithelial keratitis; PEE punctate epithelial erosions; DES dry eye syndrome; MGD meibomian gland dysfunction; ATs artificial tears; PFAT's preservative free artificial tears; NSC nuclear sclerotic cataract; PSC posterior subcapsular cataract; ERM epi-retinal membrane; PVD posterior vitreous detachment; RD retinal detachment; DM diabetes mellitus; DR diabetic retinopathy; NPDR non-proliferative diabetic retinopathy; PDR proliferative diabetic retinopathy; CSME clinically significant macular edema; DME diabetic macular edema; dbh dot blot hemorrhages; CWS cotton wool spot; POAG primary open angle glaucoma; C/D cup-to-disc ratio; HVF humphrey visual field; GVF goldmann visual field; OCT optical coherence tomography; IOP intraocular pressure; BRVO Branch retinal vein occlusion; CRVO central retinal vein occlusion; CRAO central retinal artery occlusion; BRAO branch retinal artery occlusion; RT retinal tear; SB scleral buckle; PPV pars plana vitrectomy; VH Vitreous hemorrhage; PRP panretinal laser photocoagulation; IVK intravitreal kenalog; VMT vitreomacular traction; MH Macular hole;  NVD neovascularization of the disc; NVE neovascularization elsewhere; AREDS age related eye disease study; ARMD age related macular degeneration; POAG primary open angle glaucoma; EBMD epithelial/anterior basement membrane dystrophy; ACIOL anterior chamber intraocular lens; IOL intraocular lens; PCIOL posterior chamber intraocular lens; Phaco/IOL phacoemulsification with intraocular lens placement; PRK photorefractive keratectomy; LASIK laser assisted in situ keratomileusis; HTN hypertension; DM diabetes mellitus; COPD chronic obstructive pulmonary disease

## 2024-03-05 NOTE — Telephone Encounter (Signed)
 Copied from CRM [id number]. Topic: Appointments - Scheduling Inquiry for Clinic >> Mar 05, 2024  9:45 AM Rozanna MATSU wrote: Reason for CRM: PT CALLED TO SCHEDLE HER APPT FOR DR JUDE AFTER RECEIVING A MESSSAGE AND HIS CALENDAR NOR THE PA'S OR NP'S CALENDERS ARE OPEN FOR [place] PT IS EXPECTING A CALL STATED THIS IS NOT ACCEPTABLE AND STATED SHE IS EXPECTING A CALL FROM SOMEONE TO TELL HER WHEN THE CALENDERS ARE OPEN  Called and spoke with patient regarding her yearly follow up appointment with Dr. Alva---Dr. JUDE no longer comes to the [place] office, he is now at the [place] location---patient wants to stay in [place] --scheduled  04/24/24 with Dr. DELENA.  She voiced her understanding

## 2024-03-05 NOTE — Progress Notes (Signed)
 [facility] - Clinic Note  03/17/2024   CHIEF COMPLAINT Patient presents for Retina Follow Up  HISTORY OF PRESENT ILLNESS: Jill [name] is a 57 y.o. female who presents to the clinic today for:  HPI     Retina Follow Up   Patient presents with  Diabetic Retinopathy.  In both eyes.  This started 4 weeks ago.        Comments   Patient here for 4 weeks retina follow up for PDR OU. Patient states vision not that good OS. Nothing new. Has cataract surgery OS scheduled for October. No eye pain.       Last edited by Orval Asberry RAMAN, COA on 03/17/2024  9:32 AM.     Pt states there havent been any vision changes.  Referring physician: Juli Blunt, MD [address] [place],  [place] [address]  HISTORICAL INFORMATION:  Selected notes from the MEDICAL RECORD NUMBER Referred by Dr. Juli for diabetic retinopathy eval -- new subhyaloid heme OD LEE:  Ocular Hx- PMH-   CURRENT MEDICATIONS: No current outpatient medications on file. (Ophthalmic Drugs)   No current facility-administered medications for this visit. (Ophthalmic Drugs)   Current Outpatient Medications (Other)  Medication Sig   acetaminophen  (TYLENOL ) 500 MG tablet Take 500-1,000 mg by mouth every 8 (eight) hours as needed for moderate pain or headache.    aspirin  EC 81 MG tablet Take 81 mg by mouth every morning.   brompheniramine-pseudoephedrine-DM 30-2-10 MG/5ML syrup Take 5 mLs by mouth 4 (four) times daily as needed.   Calcium -Phosphorus-Vitamin D (CITRACAL +D3 PO) Take 2 tablets by mouth in the morning and at bedtime.   clopidogrel  (PLAVIX ) 75 MG tablet Take 75 mg by mouth in the morning.   Cyanocobalamin (VITAMIN B-12) 5000 MCG SUBL Place 5,000 mcg under the tongue 3 (three) times a week.   diazepam  (VALIUM ) 5 MG tablet Take one tablet by mouth with light food one hour prior to procedure.   diazePAM , 20 MG Dose, (VALTOCO  20 MG DOSE) 2 x 10 MG/0.1ML LQPK Place 20 mg into the nose as  needed (for seizure).   docusate sodium  (COLACE) 100 MG capsule Take 100 mg by mouth in the morning.   felodipine  (PLENDIL ) 5 MG 24 hr tablet Take 5 mg by mouth in the morning.   furosemide  (LASIX ) 20 MG tablet TAKE ONE TABLET EVERY OTHER DAY AND ONE ADDITIONAL TABLET AS NEEDED FOR SWELLING. 15 IN PILL PACK/30 IN SEPARATE BOTTLE**   gabapentin  (NEURONTIN ) 300 MG capsule Take 300 mg by mouth 2 (two) times daily.   lamoTRIgine  (LAMICTAL ) 100 MG tablet Take 1 tablet (100 mg total) by mouth 2 (two) times daily.   levETIRAcetam  (KEPPRA ) 500 MG tablet Take 250 mg by mouth 2 (two) times daily.   linaclotide (LINZESS) 145 MCG CAPS capsule Take 145 mcg by mouth daily before breakfast.   LORazepam  (ATIVAN ) 0.5 MG tablet Take 0.5 mg by mouth daily as needed for anxiety.   metFORMIN  (GLUCOPHAGE ) 500 MG tablet Take 500 mg by mouth 2 (two) times daily.   metoprolol  succinate (TOPROL -XL) 25 MG 24 hr tablet Take 1 tablet by mouth daily   Multiple Vitamins-Minerals (BARIATRIC MULTIVITAMINS/IRON PO) Take 1 tablet by mouth every evening.   olmesartan (BENICAR) 40 MG tablet Take 40 mg by mouth every morning.    OZEMPIC, 1 MG/DOSE, 2 MG/1.5ML SOPN Inject 2 mg into the skin once a week.   pantoprazole  (PROTONIX ) 40 MG tablet Take 1 tablet (40 mg  total) by mouth 2 (two) times daily.   pioglitazone  (ACTOS ) 30 MG tablet Take 30 mg by mouth every morning.   potassium chloride  (KLOR-CON  M) 10 MEQ tablet Take 2 tablets (20 mEq total) by mouth every other day. Take on days you take Lasix .   rosuvastatin  (CRESTOR ) 40 MG tablet Take 1 tablet (40 mg total) by mouth at bedtime.   ezetimibe  (ZETIA ) 10 MG tablet Take 1 tablet (10 mg total) by mouth daily.   No current facility-administered medications for this visit. (Other)   REVIEW OF SYSTEMS: ROS   Positive for: Gastrointestinal, Neurological, Endocrine, Cardiovascular, Eyes Negative for: Constitutional, Skin, Genitourinary, Musculoskeletal, HENT, Respiratory, Psychiatric,  Allergic/Imm, Heme/Lymph Last edited by Orval Asberry RAMAN, COA on 03/17/2024  9:32 AM.      ALLERGIES Allergies  Allergen Reactions   Dilaudid  [Hydromorphone  Hcl] Nausea And Vomiting   Crab Extract Swelling    Crab Cakes   Crab [Shellfish Allergy] Swelling   Hydrocodone      Don't like the way it feels   Other Other (See Comments)    pt is a difficult IV stick, prefers IV team paged for IV starts   PAST MEDICAL HISTORY Past Medical History:  Diagnosis Date   Anemia    Anxiety    Arthritis    Back pain    complains when laying on hard flat surface   Chronic kidney disease    appt. /w urology 11/10/2014- for a cyst seen on US . evaluated was told it was nothing.   Diabetes mellitus    Diabetes II, oral and GLP-1.   DVT (deep venous thrombosis) (HCC)    right leg '09 or '10   Fibroids    GERD (gastroesophageal reflux disease)    Heart murmur    History of kidney stones    History of stress test    referred for cardiac cath- done here at [facility]- 09/2014   Hypercholesterolemia    Hypertension    Neuromuscular disorder (HCC)    neuropathy   PONV (postoperative nausea and vomiting)    Seizures (HCC)    02/01/23- last seizure approximately 2015;   Sleep apnea    using cpap as of 05/23/23   Stroke [facility])    '09-had blockage in brain, too deep to operateocc left lower extremity fatique, occ. strangle, occ left mouth droopmild. may have a tendency to smile or cry with no reason has had 5 strokes last stroke 2014   Past Surgical History:  Procedure Laterality Date   APPENDECTOMY     BIOPSY  11/18/2021   Procedure: BIOPSY;  Surgeon: Eartha Angelia Sieving, MD;  [facility];  Service: Gastroenterology;;  random colon;   BIOPSY  12/27/2021   Procedure: BIOPSY;  Surgeon: Eartha Angelia Sieving, MD;  [facility];  Service: Gastroenterology;;   CARDIAC CATHETERIZATION     CESAREAN SECTION  1986 & 1988   x2   CHOLECYSTECTOMY     opengallstones    COLONOSCOPY WITH PROPOFOL  N/A 11/18/2021   Procedure: COLONOSCOPY WITH PROPOFOL ;  Surgeon: Eartha Angelia Sieving, MD;  [facility];  Service: Gastroenterology;  Laterality: N/A;  1215   DILATATION & CURETTAGE/HYSTEROSCOPY WITH MYOSURE N/A 05/10/2017   Procedure: DILATATION & CURETTAGE/HYSTEROSCOPY WITH MYOSURE;  Surgeon: Rutherford Gain, MD;  Location: [facility];  Service: Gynecology;  Laterality: N/A;   ESOPHAGOGASTRODUODENOSCOPY (EGD) WITH PROPOFOL  N/A 12/27/2021   Procedure: ESOPHAGOGASTRODUODENOSCOPY (EGD) WITH PROPOFOL ;  Surgeon: Eartha Angelia Sieving, MD;  [facility];  Service:  Gastroenterology;  Laterality: N/A;  100   ESOPHAGOGASTRODUODENOSCOPY (EGD) WITH PROPOFOL  N/A 01/13/2022   Procedure: ESOPHAGOGASTRODUODENOSCOPY (EGD) WITH PROPOFOL ;  Surgeon: Eartha Angelia Sieving, MD;  [facility];  Service: Gastroenterology;  Laterality: N/A;  235, per Anette Caldron pt knows to arrive at 6:15   HEMOSTASIS CLIP PLACEMENT  01/13/2022   Procedure: HEMOSTASIS CLIP PLACEMENT;  Surgeon: Eartha Angelia Sieving, MD;  [facility];  Service: Gastroenterology;;   INCISION AND DRAINAGE ABSCESS  02/23/2023   Procedure: INCISION AND DRAINAGE OF PERI RECTAL ABSCESS;  Surgeon: Teresa Lonni HERO, MD;  Location: [facility] [place];  Service: General;;   INTERVENTIONAL RADIOLOGY PROCEDURE     CT angiography /neck -Dr. Dolphus.   IR ANGIO INTRA EXTRACRAN SEL COM CAROTID INNOMINATE BILAT MOD SED  02/22/2018   IR ANGIO INTRA EXTRACRAN SEL COM CAROTID INNOMINATE BILAT MOD SED  03/08/2022   IR ANGIO INTRA EXTRACRAN SEL COM CAROTID INNOMINATE BILAT MOD SED  09/18/2023   IR ANGIO VERTEBRAL SEL SUBCLAVIAN INNOMINATE UNI L MOD SED  09/18/2023   IR ANGIO VERTEBRAL SEL VERTEBRAL BILAT MOD SED  02/22/2018   IR ANGIO VERTEBRAL SEL VERTEBRAL UNI R MOD SED  03/08/2022   IR ANGIO VERTEBRAL SEL VERTEBRAL UNI R MOD SED  09/18/2023   IR GENERIC HISTORICAL  08/03/2016   IR  RADIOLOGIST EVAL & MGMT 08/03/2016 [facility]   IR RADIOLOGIST EVAL & MGMT  01/03/2022   LAPAROSCOPIC GASTRIC SLEEVE RESECTION N/A 04/10/2016   Procedure: LAPAROSCOPIC GASTRIC SLEEVE RESECTION WITH UPPER ENDO;  Surgeon: Camellia Blush, MD;  Location: [facility];  Service: General;  Laterality: N/A;   LEFT HEART CATHETERIZATION WITH CORONARY ANGIOGRAM N/A 09/28/2014   Procedure: LEFT HEART CATHETERIZATION WITH CORONARY ANGIOGRAM;  Surgeon: Candyce GORMAN Reek, MD;  Location: [facility];  Service: Cardiovascular;  Laterality: N/A;   LIGATION OF INTERNAL FISTULA TRACT N/A 05/24/2023   Procedure: LIGATION OF INTERSPHINCTERIC FISTULOUS TRACT;  Surgeon: Teresa Lonni HERO, MD;  Location: [facility];  Service: General;  Laterality: N/A;  90   NEPHROLITHOTOMY Right 02/01/2022   Procedure: RIGHT NEPHROLITHOTOMY PERCUTANEOUS WITH SURGEON ACCESS;  Surgeon: Cam Morene ORN, MD;  Location: [facility];  Service: Urology;  Laterality: Right;   NEPHROLITHOTOMY Left 02/15/2022   Procedure: LEFT NEPHROLITHOTOMY PERCUTANEOUS WITH SURGEON ACCESS, REMOVAL OF RIGHT URETERAL STENT;  Surgeon: Cam Morene ORN, MD;  Location: [facility];  Service: Urology;  Laterality: Left;  195 MINUTES   PHOTOCOAGULATION WITH LASER Bilateral 11/15/2023   Procedure: PHOTOCOAGULATION, EYE, USING LASER;  Surgeon: Valdemar Rogue, MD;  Location[facility] OR;  Service: Ophthalmology;  Laterality: Bilateral;   PLACEMENT OF SETON N/A 02/23/2023   Procedure: SURGICAL TREATMENT OF ANAL FISTULA-TRANSSPHINTERIL WITH PLACEMENT OF SETON;  Surgeon: Teresa Lonni HERO, MD;  Location: [facility];  Service: General;  Laterality: N/A;   POLYPECTOMY  01/13/2022   Procedure: POLYPECTOMY;  Surgeon: Eartha Angelia Sieving, MD;  [facility];  Service: Gastroenterology;;   RADIOLOGY WITH ANESTHESIA N/A 10/14/2014   Procedure: CYDNEY;  Surgeon: Thyra Dolphus, MD;  Location: [facility];  Service: Radiology;  Laterality: N/A;    RECTAL EXAM UNDER ANESTHESIA N/A 05/24/2023   Procedure: ANORECTAL EXAM UNDER ANESTHESIA;  Surgeon: Teresa Lonni HERO, MD;  Location: [facility];  Service: General;  Laterality: N/A;   TUBAL LIGATION     FAMILY HISTORY Family History  Problem Relation Age of Onset   Cataracts Mother    Kidney disease Mother  dialysis for 26 years   Heart attack Mother        66's   Diabetes Father    Cataracts Father    Heart failure Father    Heart disease Father    Heart attack Father        17's   Stroke Paternal Grandmother    SOCIAL HISTORY Social History   Tobacco Use   Smoking status: Every Day    Current packs/day: 0.25    Average packs/day: 0.3 packs/day for 48.6 years (12.2 ttl pk-yrs)    Types: Cigarettes    Start date: 07/25/1975    Passive exposure: Current   Smokeless tobacco: Never  Vaping Use   Vaping status: Never Used  Substance Use Topics   Alcohol use: Never   Drug use: No       OPHTHALMIC EXAM:  Base Eye Exam     Visual Acuity (Snellen - Linear)       Right Left   Dist cc 20/25 +1 20/100 -2   Dist ph cc  20/60 -1    Correction: Glasses         Tonometry (Tonopen, 9:30 AM)       Right Left   Pressure 15 13         Pupils       Dark Light Shape React APD   Right 2 1 Round Brisk None   Left 2 1 Round Brisk None         Visual Fields (Counting fingers)       Left Right    Full Full         Extraocular Movement       Right Left    Full, Ortho Full, Ortho         Neuro/Psych     Oriented x3: Yes   Mood/Affect: Normal         Dilation     Both eyes: 1.0% Mydriacyl , 2.5% Phenylephrine  @ 9:30 AM           Slit Lamp and Fundus Exam     Slit Lamp Exam       Right Left   Lids/Lashes Dermatochalasis - upper lid Dermatochalasis - upper lid   Conjunctiva/Sclera mild melanosis mild melanosis   Cornea tear film debris tear film debris   Anterior Chamber deep and clear deep and clear   Iris Round  and dilated, No NVI Round and dilated, No NVI   Lens 2+ Nuclear sclerosis, 2-3+ Cortical cataract 2+ Nuclear sclerosis, 2-3+ Cortical cataract, 2+ Posterior subcapsular cataract   Anterior Vitreous Vitreous syneresis, stable improvement in vitreous and subhyaloid heme, white vitreous condensations inferiorly -- persistent Vitreous syneresis, mild blood stained vit condensations. New subhyaloid heme inf to disc         Fundus Exam       Right Left   Disc Pink and Sharp, no NVD Pink and Sharp, +cupping   C/D Ratio 0.5 0.65   Macula Flat, Good foveal reflex, RPE mottling and clumping, No heme or edema Flat, Good foveal reflex, RPE mottling and clumping, scattered MA/DBH -- improved, no frank edema   Vessels attenuated, mild tortuosity, scattered NVE IN midzone regressing attenuated, tortuous, early midzonal NVE -- regressing   Periphery Attached, boat shaped subhyaloid heme inferior midzone improved, scattered NVE IN midzone -- regressing, good PRP 360 Attached, scattered MA, good PRP changes 360 w/ room for fill in temporally.  Refraction     Wearing Rx       Sphere Cylinder Axis   Right -2.00 +0.50 178   Left -2.00 +0.75 014           IMAGING AND PROCEDURES  Imaging and Procedures for 03/17/2024  Fluorescein  Angiography Optos (Transit OD)       Right Eye Progression has worsened. Early phase findings include delayed filling, microaneurysm, retinal neovascularization, vascular perfusion defect (Delayed filling, focal NVE and vascular non-perfusion IN midzone -- improved). Mid/Late phase findings include microaneurysm, retinal neovascularization, vascular perfusion defect (vascular non-perfusion with interval re-development of NVE IN midzone).   Left Eye Progression has worsened. Early phase findings include blockage, staining, microaneurysm (Interval improvement in NV-- no NVE). Mid/Late phase findings include blockage, staining, microaneurysm, vascular perfusion  defect (punctate NVE greatest inferior midzone -- slightly worse, boat shaped heme inferior to disc -- resolved).   Notes **Images stored on drive**  Impression: PDR OU (OD>OS) OD: vascular non-perfusion with interval re-development of NVE IN midzone OS: punctate NVE greatest inferior midzone -- slightly worse, boat shaped heme inferior to disc -- resolved             ASSESSMENT/PLAN:   ICD-10-CM   1. 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 Subhyaloid hemorrhage of both eyes  H35.63     3. Long term (current) use of oral hypoglycemic drugs  Z79.84     4. Long-term (current) use of injectable non-insulin  antidiabetic drugs  Z79.85     5. Essential hypertension  I10     6. Hypertensive retinopathy of both eyes  H35.033     7. Combined forms of age-related cataract of both eyes  H25.813     8. Convulsions, unspecified convulsion type (HCC)  R56.9       1-4. Proliferative diabetic retinopathy w/o DME, OU -- new VH OD today (07.22.25) - s/p IVA OD #1 (05.23.24), #2 (06.24.24), #3 (06.24.24), #4 (08.20.24), #5 (09.17.24), #6 (10.15.24), #7 (03.14.25),  - s/p IVA OS #1 (06.24.24), #2 (06.24.24), #3 (08.20.24), #4 (09.17.24), #5 (10.15.24), #6 (12.03.24), #7 (07.22.25) - s/p PRP OS (06.03.24) -- incomplete / aborted due to history of photogenic seizures - s/p PRP OU (04.25.25) -- done in the OR under GA  - FA (05.23.24) shows OD: Boat shaped blockage inferior midzone from subhyaloid heme, focal NVE and vascular non-perfusion IN midzone; OS: Scattered punctate NVE greatest inferior midzone -- pt needs PRP OU - repeat FA (07.23.24): OD: low fluorescein  signal, focal NVE and vascular non-perfusion IN midzone -- improved; OS: Low fluorescein  signal, punctate NVE greatest inferior midzone -- improved, new boat shaped  heme inferior to disc - repeat FA (10.15.24) OD: focal NVE and vascular non-perfusion IN midzone -- improved w/ regression of NV, no leakage; OS: punctate NVE greatest inferior midzone -- resolved, boat shaped heme inferior to disc- resolved - repeat FA (03.14.25) shows OD: vascular non-perfusion with interval re-development of NVE IN midzone; OS: punctate NVE greatest inferior midzone -- slightly worse, boat shaped heme inferior to disc -- resolved - repeat FA (08.25.25) shows OD: vascular non-perfusion with persistent NVE IN midzone; OS: Focal blockage from boat shaped heme inferior to disc; no obvious NV. **recurrent NVE with stoppage of anti-VEGF therapy** - s/p PRP OU (04.25.25) in OR due to h/o photogenic seizures -- good laser  changes OU - BCVA OD: 20/25, OS 20/60 -stable OU - recommend IVA today OU #8 today, (08.25.25) w/ f/u in 4 weeks - RBA of procedure discussed, questions answered - informed consent obtained and signed - see procedure note  - f/u 4 weeks, DFE, OCT  5,6. Hypertensive retinopathy OU - discussed importance of tight BP control - monitor  7. Mixed Cataract OU - The symptoms of cataract, surgical options, and treatments and risks were discussed with patient. - discussed diagnosis and progression - under the expert management of Dr. Melany, scheduled for cataract sx 10.03.25 OS.   8. Hx of photogenic seizures  Ophthalmic Meds Ordered this visit:  No orders of the defined types were placed in this encounter.    No follow-ups on file.  There are no Patient Instructions on file for this visit.  This document serves as a record of services personally performed by Redell JUDITHANN Hans, MD, PhD. It was created on their behalf by Avelina Pereyra, COA an ophthalmic technician. The creation of this record is the provider's dictation and/or activities during the visit.   Electronically signed by: Avelina GORMAN Pereyra, COT  03/17/24  10:30 AM       Redell JUDITHANN Hans, M.D.,  Ph.D. Diseases & Surgery of the Retina and Vitreous [facility]    Abbreviations: M myopia (nearsighted); A astigmatism; H hyperopia (farsighted); P presbyopia; Mrx spectacle prescription;  CTL contact lenses; OD right eye; OS left eye; OU both eyes  XT exotropia; ET esotropia; PEK punctate epithelial keratitis; PEE punctate epithelial erosions; DES dry eye syndrome; MGD meibomian gland dysfunction; ATs artificial tears; PFAT's preservative free artificial tears; NSC nuclear sclerotic cataract; PSC posterior subcapsular cataract; ERM epi-retinal membrane; PVD posterior vitreous detachment; RD retinal detachment; DM diabetes mellitus; DR diabetic retinopathy; NPDR non-proliferative diabetic retinopathy; PDR proliferative diabetic retinopathy; CSME clinically significant macular edema; DME diabetic macular edema; dbh dot blot hemorrhages; CWS cotton wool spot; POAG primary open angle glaucoma; C/D cup-to-disc ratio; HVF humphrey visual field; GVF goldmann visual field; OCT optical coherence tomography; IOP intraocular pressure; BRVO Branch retinal vein occlusion; CRVO central retinal vein occlusion; CRAO central retinal artery occlusion; BRAO branch retinal artery occlusion; RT retinal tear; SB scleral buckle; PPV pars plana vitrectomy; VH Vitreous hemorrhage; PRP panretinal laser photocoagulation; IVK intravitreal kenalog; VMT vitreomacular traction; MH Macular hole;  NVD neovascularization of the disc; NVE neovascularization elsewhere; AREDS age related eye disease study; ARMD age related macular degeneration; POAG primary open angle glaucoma; EBMD epithelial/anterior basement membrane dystrophy; ACIOL anterior chamber intraocular lens; IOL intraocular lens; PCIOL posterior chamber intraocular lens; Phaco/IOL phacoemulsification with intraocular lens placement; PRK photorefractive keratectomy; LASIK laser assisted in situ keratomileusis; HTN hypertension; DM diabetes mellitus; COPD  chronic obstructive pulmonary disease

## 2024-03-11 ENCOUNTER — Encounter (INDEPENDENT_AMBULATORY_CARE_PROVIDER_SITE_OTHER): Admitting: Ophthalmology

## 2024-03-12 ENCOUNTER — Other Ambulatory Visit: Payer: Self-pay | Admitting: Cardiology

## 2024-03-14 MED ORDER — POTASSIUM CHLORIDE CRYS ER 10 MEQ PO TBCR
20.0000 meq | EXTENDED_RELEASE_TABLET | ORAL | 3 refills | Status: AC
Start: 2024-03-14 — End: ?

## 2024-03-17 ENCOUNTER — Ambulatory Visit (INDEPENDENT_AMBULATORY_CARE_PROVIDER_SITE_OTHER): Admitting: Ophthalmology

## 2024-03-17 ENCOUNTER — Encounter (INDEPENDENT_AMBULATORY_CARE_PROVIDER_SITE_OTHER): Payer: Self-pay | Admitting: Ophthalmology

## 2024-03-17 VITALS — BP 124/84 | HR 69

## 2024-03-17 DIAGNOSIS — Z7985 Long-term (current) use of injectable non-insulin antidiabetic drugs: Secondary | ICD-10-CM | POA: Diagnosis not present

## 2024-03-17 DIAGNOSIS — I10 Essential (primary) hypertension: Secondary | ICD-10-CM

## 2024-03-17 DIAGNOSIS — E113593 Type 2 diabetes mellitus with proliferative diabetic retinopathy without macular edema, bilateral: Secondary | ICD-10-CM

## 2024-03-17 DIAGNOSIS — Z7984 Long term (current) use of oral hypoglycemic drugs: Secondary | ICD-10-CM

## 2024-03-17 DIAGNOSIS — R569 Unspecified convulsions: Secondary | ICD-10-CM

## 2024-03-17 DIAGNOSIS — H3563 Retinal hemorrhage, bilateral: Secondary | ICD-10-CM | POA: Diagnosis not present

## 2024-03-17 DIAGNOSIS — H25813 Combined forms of age-related cataract, bilateral: Secondary | ICD-10-CM

## 2024-03-17 DIAGNOSIS — H35033 Hypertensive retinopathy, bilateral: Secondary | ICD-10-CM

## 2024-03-17 MED ORDER — BEVACIZUMAB CHEMO INJECTION 1.25MG/0.05ML SYRINGE FOR KALEIDOSCOPE
1.2500 mg | INTRAVITREAL | Status: AC | PRN
  Administered 2024-03-17: 1.25 mg via INTRAVITREAL

## 2024-03-27 ENCOUNTER — Ambulatory Visit: Admitting: Orthopedic Surgery

## 2024-03-28 ENCOUNTER — Encounter (INDEPENDENT_AMBULATORY_CARE_PROVIDER_SITE_OTHER): Admitting: Ophthalmology

## 2024-03-31 ENCOUNTER — Other Ambulatory Visit: Payer: Self-pay

## 2024-03-31 ENCOUNTER — Ambulatory Visit (INDEPENDENT_AMBULATORY_CARE_PROVIDER_SITE_OTHER): Admitting: Physical Medicine and Rehabilitation

## 2024-03-31 VITALS — BP 162/82 | HR 77

## 2024-03-31 DIAGNOSIS — M5416 Radiculopathy, lumbar region: Secondary | ICD-10-CM | POA: Diagnosis not present

## 2024-03-31 MED ORDER — METHYLPREDNISOLONE ACETATE 40 MG/ML IJ SUSP
40.0000 mg | Freq: Once | INTRAMUSCULAR | Status: AC
  Administered 2024-03-31: 40 mg

## 2024-03-31 NOTE — Procedures (Signed)
 Lumbosacral Transforaminal Epidural Steroid Injection - Sub-Pedicular Approach with Fluoroscopic Guidance  Patient: Jill [name]      Date of Birth: 1965/08/27 MRN: [id number] PCP: Leigh Lung, MD      Visit Date: 03/31/2024   Universal Protocol:    Date/Time: 03/31/2024  Consent Given By: the patient  Position: PRONE  Additional Comments: Vital signs were monitored before and after the procedure. Patient was prepped and draped in the usual sterile fashion. The correct patient, procedure, and site was verified.   Injection Procedure Details:   Procedure diagnoses: Lumbar radiculopathy [M54.16]    Meds Administered:  Meds ordered this encounter  Medications   methylPREDNISolone  acetate (DEPO-MEDROL ) injection 40 mg    Laterality: Left  Location/Site: L4  Needle:5.0 in., 22 ga.  Short bevel or Quincke spinal needle  Needle Placement: Transforaminal  Findings:    -Comments: Excellent flow of contrast along the nerve, nerve root and into the epidural space.  Procedure Details: After squaring off the end-plates to get a true AP view, the C-arm was positioned so that an oblique view of the foramen as noted above was visualized. The target area is just inferior to the nose of the scotty dog or sub pedicular. The soft tissues overlying this structure were infiltrated with 2-3 ml. of 1% Lidocaine  without Epinephrine .  The spinal needle was inserted toward the target using a trajectory view along the fluoroscope beam.  Under AP and lateral visualization, the needle was advanced so it did not puncture dura and was located close the 6 O'Clock position of the pedical in AP tracterory. Biplanar projections were used to confirm position. Aspiration was confirmed to be negative for CSF and/or blood. A 1-2 ml. volume of Isovue -250 was injected and flow of contrast was noted at each level. Radiographs were obtained for documentation purposes.   After attaining the desired flow of  contrast documented above, a 0.5 to 1.0 ml test dose of 0.25% Marcaine  was injected into each respective transforaminal space.  The patient was observed for 90 seconds post injection.  After no sensory deficits were reported, and normal lower extremity motor function was noted,   the above injectate was administered so that equal amounts of the injectate were placed at each foramen (level) into the transforaminal epidural space.   Additional Comments:  The patient tolerated the procedure well Dressing: 2 x 2 sterile gauze and Band-Aid    Post-procedure details: Patient was observed during the procedure. Post-procedure instructions were reviewed.  Patient left the clinic in stable condition.

## 2024-03-31 NOTE — Progress Notes (Signed)
 Pain Scale   Average Pain 6 Patient advising she has lower back pain radiating to left leg, increases pain when standing and decreasing when sitting.        +Driver, -BT, -Dye Allergies.

## 2024-03-31 NOTE — Progress Notes (Signed)
 Jill [name] - 58 y.o. female MRN [id number]  Date of birth: 16-Sep-1965  Office Visit Note: Visit Date: 03/31/2024 PCP: Leigh Lung, MD Referred by: Georgina Ozell LABOR, MD  Subjective: Chief Complaint  Patient presents with   Lower Back - Pain   HPI:  Jill [name] is a 58 y.o. female who comes in today at the request of Dr. Ozell Georgina for planned Left L4-5 Lumbar Transforaminal epidural steroid injection with fluoroscopic guidance.  The patient has failed conservative care including home exercise, medications, time and activity modification.  This injection will be diagnostic and hopefully therapeutic.  Please see requesting physician notes for further details and justification. Ok on Plavix . Will call Dr. Georgina with results of injection a couple of weeks.   ROS Otherwise per HPI.  Assessment & Plan: Visit Diagnoses:    ICD-10-CM   1. Lumbar radiculopathy  M54.16 XR C-ARM NO REPORT    Epidural Steroid injection    methylPREDNISolone  acetate (DEPO-MEDROL ) injection 40 mg      Plan: No additional findings.   Meds & Orders:  Meds ordered this encounter  Medications   methylPREDNISolone  acetate (DEPO-MEDROL ) injection 40 mg    Orders Placed This Encounter  Procedures   XR C-ARM NO REPORT   Epidural Steroid injection    Follow-up: Return for visit to requesting provider as needed.   Procedures: No procedures performed  Lumbosacral Transforaminal Epidural Steroid Injection - Sub-Pedicular Approach with Fluoroscopic Guidance  Patient: Jill [name]      Date of Birth: 1965/12/13 MRN: [id number] PCP: Leigh Lung, MD      Visit Date: 03/31/2024   Universal Protocol:    Date/Time: 03/31/2024  Consent Given By: the patient  Position: PRONE  Additional Comments: Vital signs were monitored before and after the procedure. Patient was prepped and draped in the usual sterile fashion. The correct patient, procedure, and site was verified.   Injection Procedure  Details:   Procedure diagnoses: Lumbar radiculopathy [M54.16]    Meds Administered:  Meds ordered this encounter  Medications   methylPREDNISolone  acetate (DEPO-MEDROL ) injection 40 mg    Laterality: Left  Location/Site: L4  Needle:5.0 in., 22 ga.  Short bevel or Quincke spinal needle  Needle Placement: Transforaminal  Findings:    -Comments: Excellent flow of contrast along the nerve, nerve root and into the epidural space.  Procedure Details: After squaring off the end-plates to get a true AP view, the C-arm was positioned so that an oblique view of the foramen as noted above was visualized. The target area is just inferior to the nose of the scotty dog or sub pedicular. The soft tissues overlying this structure were infiltrated with 2-3 ml. of 1% Lidocaine  without Epinephrine .  The spinal needle was inserted toward the target using a trajectory view along the fluoroscope beam.  Under AP and lateral visualization, the needle was advanced so it did not puncture dura and was located close the 6 O'Clock position of the pedical in AP tracterory. Biplanar projections were used to confirm position. Aspiration was confirmed to be negative for CSF and/or blood. A 1-2 ml. volume of Isovue -250 was injected and flow of contrast was noted at each level. Radiographs were obtained for documentation purposes.   After attaining the desired flow of contrast documented above, a 0.5 to 1.0 ml test dose of 0.25% Marcaine  was injected into each respective transforaminal space.  The patient was observed for 90 seconds post injection.  After no sensory deficits were reported,  and normal lower extremity motor function was noted,   the above injectate was administered so that equal amounts of the injectate were placed at each foramen (level) into the transforaminal epidural space.   Additional Comments:  The patient tolerated the procedure well Dressing: 2 x 2 sterile gauze and Band-Aid     Post-procedure details: Patient was observed during the procedure. Post-procedure instructions were reviewed.  Patient left the clinic in stable condition.    Clinical History: MRI LUMBAR SPINE WITHOUT CONTRAST   TECHNIQUE: Multiplanar, multisequence MR imaging of the lumbar spine was performed. No intravenous contrast was administered.   COMPARISON:  Radiography 07/06/2023   FINDINGS: Segmentation:  5 lumbar type vertebral bodies.   Alignment: Mild scoliotic curvature convex to the right. 2 mm degenerative anterolisthesis at L3-4 and L4-5.   Vertebrae: No vertebral body fracture or focal lesion. Edematous facet arthritic changes at L3-4 and L4-5.   Conus medullaris and cauda equina: Conus extends to the L1-2 level. Conus and cauda equina appear normal.   Paraspinal and other soft tissues: Negative   Disc levels:   No significant finding at L2-3 or above.   L3-4: Bilateral facet degeneration and hypertrophy with 2 mm of degenerative anterolisthesis. Mild bulging of the disc. Prominent epidural fat. Mild multifactorial stenosis at this level that could be symptomatic. The facet arthropathy would also likely be painful.   L4-5: Bilateral facet arthropathy with gaping, fluid-filled joints. Anterolisthesis of 2 mm it would likely worsen with standing or flexion. Shallow protrusion of the disc. Moderate multifactorial stenosis that would likely worsen with standing or flexion and could cause neural compression on either or both sides. The facet arthritis would also likely be painful.   L5-S1: Minimal disc bulge.  No canal or foraminal stenosis.   IMPRESSION: 1. L3-4: Bilateral facet degeneration and hypertrophy with 2 mm of degenerative anterolisthesis. Mild bulging of the disc. Prominent epidural fat. Mild multifactorial stenosis that could be symptomatic. The facet arthropathy would also likely be painful. 2. L4-5: Bilateral facet arthropathy with gaping,  fluid-filled joints. Anterolisthesis of 2 mm that would likely worsen with standing or flexion. Shallow protrusion of the disc. Moderate multifactorial stenosis that would likely worsen with standing or flexion and could cause neural compression on either or both sides. The facet arthritis would also likely be painful.     Electronically Signed   By: Oneil Officer M.D.   On: 10/24/2023 13:44     Objective:  VS:  HT:    WT:   BMI:     BP:(!) 162/82  HR:77bpm  TEMP: ( )  RESP:  Physical Exam Vitals and nursing note reviewed.  Constitutional:      General: She is not in acute distress.    Appearance: Normal appearance. She is obese. She is not ill-appearing.  HENT:     Head: Normocephalic and atraumatic.     Right Ear: External ear normal.     Left Ear: External ear normal.  Eyes:     Extraocular Movements: Extraocular movements intact.  Cardiovascular:     Rate and Rhythm: Normal rate.     Pulses: Normal pulses.  Pulmonary:     Effort: Pulmonary effort is normal. No respiratory distress.  Abdominal:     General: There is no distension.     Palpations: Abdomen is soft.  Musculoskeletal:        General: Tenderness present.     Cervical back: Neck supple.     Right lower leg: No  edema.     Left lower leg: No edema.     Comments: Patient has good distal strength with no pain over the greater trochanters.  No clonus or focal weakness.  Skin:    Findings: No erythema, lesion or rash.  Neurological:     General: No focal deficit present.     Mental Status: She is alert and oriented to person, place, and time.     Sensory: No sensory deficit.     Motor: No weakness or abnormal muscle tone.     Coordination: Coordination normal.  Psychiatric:        Mood and Affect: Mood normal.        Behavior: Behavior normal.      Imaging: No results found.

## 2024-04-07 ENCOUNTER — Other Ambulatory Visit: Payer: Self-pay | Admitting: Cardiology

## 2024-04-14 NOTE — Progress Notes (Signed)
 [facility] - Clinic Note  04/18/2024   CHIEF COMPLAINT Patient presents for Retina Follow Up  HISTORY OF PRESENT ILLNESS: Jill [name] is a 58 y.o. female who presents to the clinic today for:  HPI     Retina Follow Up   Patient presents with  Diabetic Retinopathy.  In both eyes.  This started 4 weeks ago.  I, the attending physician,  performed the HPI with the patient and updated documentation appropriately.        Comments   Patient here for 4 weeks retina follow up for PDR OU. Patient states vision is ok. No eye pain. Has Cataract surgery OS scheduled for October 3 rd.      Last edited by Valdemar Rogue, MD on 04/18/2024  6:24 PM.      Pt states VA is stable, less floaters very few.   Referring physician: Juli Blunt, MD [address] [place],  [place] [address]  HISTORICAL INFORMATION:  Selected notes from the MEDICAL RECORD NUMBER Referred by Dr. Harrie for diabetic retinopathy eval -- new subhyaloid heme OD LEE:  Ocular Hx- PMH-   CURRENT MEDICATIONS: No current outpatient medications on file. (Ophthalmic Drugs)   No current facility-administered medications for this visit. (Ophthalmic Drugs)   Current Outpatient Medications (Other)  Medication Sig   acetaminophen  (TYLENOL ) 500 MG tablet Take 500-1,000 mg by mouth every 8 (eight) hours as needed for moderate pain or headache.    aspirin  EC 81 MG tablet Take 81 mg by mouth every morning.   brompheniramine-pseudoephedrine-DM 30-2-10 MG/5ML syrup Take 5 mLs by mouth 4 (four) times daily as needed.   Calcium -Phosphorus-Vitamin D (CITRACAL +D3 PO) Take 2 tablets by mouth in the morning and at bedtime.   clopidogrel  (PLAVIX ) 75 MG tablet Take 75 mg by mouth in the morning.   Cyanocobalamin (VITAMIN B-12) 5000 MCG SUBL Place 5,000 mcg under the tongue 3 (three) times a week.   diazepam  (VALIUM ) 5 MG tablet Take one tablet by mouth with light food one hour prior to procedure.    diazePAM , 20 MG Dose, (VALTOCO  20 MG DOSE) 2 x 10 MG/0.1ML LQPK Place 20 mg into the nose as needed (for seizure).   docusate sodium  (COLACE) 100 MG capsule Take 100 mg by mouth in the morning.   ezetimibe  (ZETIA ) 10 MG tablet Take 1 tablet (10 mg total) by mouth daily.   felodipine  (PLENDIL ) 5 MG 24 hr tablet Take 5 mg by mouth in the morning.   furosemide  (LASIX ) 20 MG tablet TAKE ONE TABLET EVERY OTHER DAY AND ONE ADDITIONAL TABLET AS NEEDED FOR SWELLING. 15 IN PILL PACK/30 IN SEPARATE BOTTLE**   gabapentin  (NEURONTIN ) 300 MG capsule Take 300 mg by mouth 2 (two) times daily.   lamoTRIgine  (LAMICTAL ) 100 MG tablet Take 1 tablet (100 mg total) by mouth 2 (two) times daily.   levETIRAcetam  (KEPPRA ) 500 MG tablet Take 250 mg by mouth 2 (two) times daily.   linaclotide (LINZESS) 145 MCG CAPS capsule Take 145 mcg by mouth daily before breakfast.   LORazepam  (ATIVAN ) 0.5 MG tablet Take 0.5 mg by mouth daily as needed for anxiety.   metFORMIN  (GLUCOPHAGE ) 500 MG tablet Take 500 mg by mouth 2 (two) times daily.   metoprolol  succinate (TOPROL -XL) 25 MG 24 hr tablet Take 1 tablet by mouth daily   Multiple Vitamins-Minerals (BARIATRIC MULTIVITAMINS/IRON PO) Take 1 tablet by mouth every evening.   olmesartan (BENICAR) 40 MG tablet Take 40 mg by mouth every  morning.    OZEMPIC, 1 MG/DOSE, 2 MG/1.5ML SOPN Inject 2 mg into the skin once a week.   pantoprazole  (PROTONIX ) 40 MG tablet Take 1 tablet (40 mg total) by mouth 2 (two) times daily.   pioglitazone  (ACTOS ) 30 MG tablet Take 30 mg by mouth every morning.   potassium chloride  (KLOR-CON  M) 10 MEQ tablet Take 2 tablets (20 mEq total) by mouth every other day. Take on days you take Lasix .   rosuvastatin  (CRESTOR ) 40 MG tablet Take 1 tablet (40 mg total) by mouth at bedtime.   No current facility-administered medications for this visit. (Other)   REVIEW OF SYSTEMS: ROS   Positive for: Gastrointestinal, Neurological, Endocrine, Cardiovascular,  Eyes Negative for: Constitutional, Skin, Genitourinary, Musculoskeletal, HENT, Respiratory, Psychiatric, Allergic/Imm, Heme/Lymph Last edited by Orval Asberry RAMAN, COA on 04/18/2024  9:33 AM.     ALLERGIES Allergies  Allergen Reactions   Dilaudid  [Hydromorphone  Hcl] Nausea And Vomiting   Crab Extract Swelling    Crab Cakes   Crab [Shellfish Allergy] Swelling   Hydrocodone      Don't like the way it feels   Other Other (See Comments)    pt is a difficult IV stick, prefers IV team paged for IV starts   PAST MEDICAL HISTORY Past Medical History:  Diagnosis Date   Anemia    Anxiety    Arthritis    Back pain    complains when laying on hard flat surface   Chronic kidney disease    appt. /w urology 11/10/2014- for a cyst seen on US . evaluated was told it was nothing.   Diabetes mellitus    Diabetes II, oral and GLP-1.   DVT (deep venous thrombosis) (HCC)    right leg '09 or '10   Fibroids    GERD (gastroesophageal reflux disease)    Heart murmur    History of kidney stones    History of stress test    referred for cardiac cath- done here at [facility]- 09/2014   Hypercholesterolemia    Hypertension    Neuromuscular disorder (HCC)    neuropathy   PONV (postoperative nausea and vomiting)    Seizures (HCC)    02/01/23- last seizure approximately 2015;   Sleep apnea    using cpap as of 05/23/23   Stroke [facility])    '09-had blockage in brain, too deep to operateocc left lower extremity fatique, occ. strangle, occ left mouth droopmild. may have a tendency to smile or cry with no reason has had 5 strokes last stroke 2014   Past Surgical History:  Procedure Laterality Date   APPENDECTOMY     BIOPSY  11/18/2021   Procedure: BIOPSY;  Surgeon: Eartha Angelia Sieving, MD;  [facility];  Service: Gastroenterology;;  random colon;   BIOPSY  12/27/2021   Procedure: BIOPSY;  Surgeon: Eartha Angelia Sieving, MD;  [facility];  Service: Gastroenterology;;    CARDIAC CATHETERIZATION     CESAREAN SECTION  1986 & 1988   x2   CHOLECYSTECTOMY     opengallstones   COLONOSCOPY WITH PROPOFOL  N/A 11/18/2021   Procedure: COLONOSCOPY WITH PROPOFOL ;  Surgeon: Eartha Angelia Sieving, MD;  [facility];  Service: Gastroenterology;  Laterality: N/A;  1215   DILATATION & CURETTAGE/HYSTEROSCOPY WITH MYOSURE N/A 05/10/2017   Procedure: DILATATION & CURETTAGE/HYSTEROSCOPY WITH MYOSURE;  Surgeon: Rutherford Gain, MD;  Location: [facility];  Service: Gynecology;  Laterality: N/A;   ESOPHAGOGASTRODUODENOSCOPY (EGD) WITH PROPOFOL  N/A 12/27/2021   Procedure: ESOPHAGOGASTRODUODENOSCOPY (EGD) WITH  PROPOFOL ;  Surgeon: Eartha Flavors, Toribio, MD;  [facility];  Service: Gastroenterology;  Laterality: N/A;  100   ESOPHAGOGASTRODUODENOSCOPY (EGD) WITH PROPOFOL  N/A 01/13/2022   Procedure: ESOPHAGOGASTRODUODENOSCOPY (EGD) WITH PROPOFOL ;  Surgeon: Eartha Flavors Toribio, MD;  [facility];  Service: Gastroenterology;  Laterality: N/A;  235, per Anette Caldron pt knows to arrive at 6:15   HEMOSTASIS CLIP PLACEMENT  01/13/2022   Procedure: HEMOSTASIS CLIP PLACEMENT;  Surgeon: Eartha Flavors Toribio, MD;  [facility];  Service: Gastroenterology;;   INCISION AND DRAINAGE ABSCESS  02/23/2023   Procedure: INCISION AND DRAINAGE OF PERI RECTAL ABSCESS;  Surgeon: Teresa Lonni HERO, MD;  Location: [facility] ;  Service: General;;   INTERVENTIONAL RADIOLOGY PROCEDURE     CT angiography /neck -Dr. Dolphus.   IR ANGIO INTRA EXTRACRAN SEL COM CAROTID INNOMINATE BILAT MOD SED  02/22/2018   IR ANGIO INTRA EXTRACRAN SEL COM CAROTID INNOMINATE BILAT MOD SED  03/08/2022   IR ANGIO INTRA EXTRACRAN SEL COM CAROTID INNOMINATE BILAT MOD SED  09/18/2023   IR ANGIO VERTEBRAL SEL SUBCLAVIAN INNOMINATE UNI L MOD SED  09/18/2023   IR ANGIO VERTEBRAL SEL VERTEBRAL BILAT MOD SED  02/22/2018   IR ANGIO VERTEBRAL SEL VERTEBRAL UNI R MOD SED   03/08/2022   IR ANGIO VERTEBRAL SEL VERTEBRAL UNI R MOD SED  09/18/2023   IR GENERIC HISTORICAL  08/03/2016   IR RADIOLOGIST EVAL & MGMT 08/03/2016 [facility]   IR RADIOLOGIST EVAL & MGMT  01/03/2022   LAPAROSCOPIC GASTRIC SLEEVE RESECTION N/A 04/10/2016   Procedure: LAPAROSCOPIC GASTRIC SLEEVE RESECTION WITH UPPER ENDO;  Surgeon: Camellia Blush, MD;  Location: [facility];  Service: General;  Laterality: N/A;   LEFT HEART CATHETERIZATION WITH CORONARY ANGIOGRAM N/A 09/28/2014   Procedure: LEFT HEART CATHETERIZATION WITH CORONARY ANGIOGRAM;  Surgeon: Candyce GORMAN Reek, MD;  Location: [facility] CATH LAB;  Service: Cardiovascular;  Laterality: N/A;   LIGATION OF INTERNAL FISTULA TRACT N/A 05/24/2023   Procedure: LIGATION OF INTERSPHINCTERIC FISTULOUS TRACT;  Surgeon: Teresa Lonni HERO, MD;  Location: [facility];  Service: General;  Laterality: N/A;  90   NEPHROLITHOTOMY Right 02/01/2022   Procedure: RIGHT NEPHROLITHOTOMY PERCUTANEOUS WITH SURGEON ACCESS;  Surgeon: Cam Morene ORN, MD;  Location: [facility];  Service: Urology;  Laterality: Right;   NEPHROLITHOTOMY Left 02/15/2022   Procedure: LEFT NEPHROLITHOTOMY PERCUTANEOUS WITH SURGEON ACCESS, REMOVAL OF RIGHT URETERAL STENT;  Surgeon: Cam Morene ORN, MD;  Location: [facility];  Service: Urology;  Laterality: Left;  195 MINUTES   PHOTOCOAGULATION WITH LASER Bilateral 11/15/2023   Procedure: PHOTOCOAGULATION, EYE, USING LASER;  Surgeon: Valdemar Rogue, MD;  Location: [facility] OR;  Service: Ophthalmology;  Laterality: Bilateral;   PLACEMENT OF SETON N/A 02/23/2023   Procedure: SURGICAL TREATMENT OF ANAL FISTULA-TRANSSPHINTERIL WITH PLACEMENT OF SETON;  Surgeon: Teresa Lonni HERO, MD;  Location: [facility];  Service: General;  Laterality: N/A;   POLYPECTOMY  01/13/2022   Procedure: POLYPECTOMY;  Surgeon: Eartha Flavors Toribio, MD;  [facility];  Service: Gastroenterology;;   RADIOLOGY WITH ANESTHESIA N/A 10/14/2014    Procedure: CYDNEY;  Surgeon: Thyra Dolphus, MD;  Location: [facility];  Service: Radiology;  Laterality: N/A;   RECTAL EXAM UNDER ANESTHESIA N/A 05/24/2023   Procedure: ANORECTAL EXAM UNDER ANESTHESIA;  Surgeon: Teresa Lonni HERO, MD;  Location: [facility];  Service: General;  Laterality: N/A;   TUBAL LIGATION     FAMILY HISTORY Family History  Problem Relation Age  of Onset   Cataracts Mother    Kidney disease Mother        dialysis for 26 years   Heart attack Mother        38's   Diabetes Father    Cataracts Father    Heart failure Father    Heart disease Father    Heart attack Father        38's   Stroke Paternal Grandmother    SOCIAL HISTORY Social History   Tobacco Use   Smoking status: Every Day    Current packs/day: 0.25    Average packs/day: 0.2 packs/day for 48.7 years (12.2 ttl pk-yrs)    Types: Cigarettes    Start date: 07/25/1975    Passive exposure: Current   Smokeless tobacco: Never  Vaping Use   Vaping status: Never Used  Substance Use Topics   Alcohol use: Never   Drug use: No       OPHTHALMIC EXAM:  Base Eye Exam     Visual Acuity (Snellen - Linear)       Right Left   Dist cc 20/25 -2 20/100 -2   Dist ph cc  20/70 -2    Correction: Glasses         Tonometry (Tonopen, 9:30 AM)       Right Left   Pressure 18 15         Pupils       Dark Light Shape React APD   Right 2 1 Round Brisk None   Left 2 1 Round Brisk None         Visual Fields (Counting fingers)       Left Right    Full Full         Extraocular Movement       Right Left    Full, Ortho Full, Ortho         Neuro/Psych     Oriented x3: Yes   Mood/Affect: Normal         Dilation     Both eyes: 1.0% Mydriacyl , 2.5% Phenylephrine  @ 9:30 AM           Slit Lamp and Fundus Exam     Slit Lamp Exam       Right Left   Lids/Lashes Dermatochalasis - upper lid Dermatochalasis - upper lid   Conjunctiva/Sclera mild melanosis mild  melanosis   Cornea tear film debris tear film debris   Anterior Chamber deep and clear deep and clear   Iris Round and dilated, No NVI Round and dilated, No NVI   Lens 2+ Nuclear sclerosis, 2-3+ Cortical cataract 2+ Nuclear sclerosis, 2-3+ Cortical cataract, 2+ Posterior subcapsular cataract   Anterior Vitreous Vitreous syneresis, stable improvement in vitreous and subhyaloid heme, white vitreous condensations inferiorly -- persistent Vitreous syneresis, mild blood stained vit condensations. New subhyaloid heme inf to disc         Fundus Exam       Right Left   Disc Pink and Sharp, no NVD Pink and Sharp, +cupping   C/D Ratio 0.5 0.65   Macula Flat, Good foveal reflex, RPE mottling and clumping, No heme or edema Flat, Good foveal reflex, RPE mottling and clumping, scattered MA/DBH -- improved, no frank edema   Vessels attenuated, mild tortuosity, scattered NVE IN midzone attenuated, tortuous, early midzonal NVE -- regressing   Periphery Attached, scattered NVE IN midzone, good PRP 360 Attached, scattered MA, boat shaped subhyaloid heme inferiorly--persistent, good PRP changes 360 w/ room  for fill in temporally           Refraction     Wearing Rx       Sphere Cylinder Axis   Right -2.00 +0.50 178   Left -2.00 +0.75 014           IMAGING AND PROCEDURES  Imaging and Procedures for 04/18/2024  OCT, Retina - OU - Both Eyes       Right Eye Quality was good. Central Foveal Thickness: 214. Progression has been stable. Findings include normal foveal contour, no IRF, no SRF, intraretinal hyper-reflective material (Stable improvement in cystic changes ST mac--no edema, Partial PVD with stable improvement in vitreous opacities).   Left Eye Quality was borderline. Central Foveal Thickness: 250. Progression has improved. Findings include normal foveal contour, no IRF, no SRF, vitreomacular adhesion (No DME, Mild diffuse atrophy, persistent vitreous opacities--slightly improved. Partial  PVD).   Notes *Images captured and stored on drive  Diagnosis / Impression:  No DME OU OD: Stable improvement in cystic changes ST mac--no edema, Partial PVD with stable improvement in vitreous opacities OS: No DME, Mild diffuse atrophy, persistent vitreous opacities--slightly improved. Partial PVD  Clinical management:  See below  Abbreviations: NFP - Normal foveal profile. CME - cystoid macular edema. PED - pigment epithelial detachment. IRF - intraretinal fluid. SRF - subretinal fluid. EZ - ellipsoid zone. ERM - epiretinal membrane. ORA - outer retinal atrophy. ORT - outer retinal tubulation. SRHM - subretinal hyper-reflective material. IRHM - intraretinal hyper-reflective material      Intravitreal Injection, Pharmacologic Agent - OD - Right Eye       Time Out 04/18/2024. 10:29 AM. Confirmed correct patient, procedure, site, and patient consented.   Anesthesia Topical anesthesia was used. Anesthetic medications included Lidocaine  2%, Proparacaine  0.5%.   Procedure Preparation included 5% betadine to ocular surface, eyelid speculum. A (32g) needle was used.   Injection: 1.25 mg Bevacizumab  1.25mg /0.25ml   Route: Intravitreal, Site: Right Eye   NDC: H525437, Lot: [id number], Expiration date: 06/19/2024   Post-op Post injection exam found visual acuity of at least counting fingers. The patient tolerated the procedure well. There were no complications. The patient received written and verbal post procedure care education.      Intravitreal Injection, Pharmacologic Agent - OS - Left Eye       Time Out 04/18/2024. 10:29 AM. Confirmed correct patient, procedure, site, and patient consented.   Anesthesia Topical anesthesia was used. Anesthetic medications included Lidocaine  2%, Proparacaine  0.5%.   Procedure Preparation included 5% betadine to ocular surface, eyelid speculum. A (32g) needle was used.   Injection: 1.25 mg Bevacizumab  1.25mg /0.60ml   Route:  Intravitreal, Site: Left Eye   NDC: H525437, Lot: [id number], Expiration date: 08/02/2024   Post-op Post injection exam found visual acuity of at least counting fingers. The patient tolerated the procedure well. There were no complications. The patient received written and verbal post procedure care education.           ASSESSMENT/PLAN:   ICD-10-CM   1. 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 Subhyaloid hemorrhage of both eyes  H35.63     3. Long  term (current) use of oral hypoglycemic drugs  Z79.84     4. Long-term (current) use of injectable non-insulin  antidiabetic drugs  Z79.85     5. Essential hypertension  I10     6. Hypertensive retinopathy of both eyes  H35.033     7. Combined forms of age-related cataract of both eyes  H25.813      1-4. Proliferative diabetic retinopathy w/o DME, OU -- new VH OD today (07.22.25) - s/p IVA OD #1 (05.23.24), #2 (06.24.24), #3 (06.24.24), #4 (08.20.24), #5 (09.17.24), #6 (10.15.24), #7 (03.14.25), #8(08.25.25)  - s/p IVA OS #1 (06.24.24), #2 (06.24.24), #3 (08.20.24), #4 (09.17.24), #5 (10.15.24), #6 (12.03.24), #7 (07.22.25) #8(08.25.25) - s/p PRP OS (06.03.24) -- incomplete / aborted due to history of photogenic seizures - s/p PRP OU (04.25.25) -- done in the OR under [facility]  - FA (05.23.24) shows OD: Boat shaped blockage inferior midzone from subhyaloid heme, focal NVE and vascular non-perfusion IN midzone; OS: Scattered punctate NVE greatest inferior midzone -- pt needs PRP OU - repeat FA (07.23.24): OD: low fluorescein  signal, focal NVE and vascular non-perfusion IN midzone -- improved; OS: Low fluorescein  signal,  punctate NVE greatest inferior midzone -- improved, new boat shaped heme inferior to disc - repeat FA (10.15.24) OD: focal NVE and vascular non-perfusion IN midzone -- improved w/ regression of NV, no leakage; OS: punctate NVE greatest inferior midzone -- resolved, boat shaped heme inferior to disc- resolved - repeat FA (03.14.25) shows OD: vascular non-perfusion with interval re-development of NVE IN midzone; OS: punctate NVE greatest inferior midzone -- slightly worse, boat shaped heme inferior to disc -- resolved - repeat FA (08.25.25) shows OD: vascular non-perfusion with persistent NVE IN midzone; OS: Focal blockage from boat shaped heme inferior to disc; no obvious NV. - OCT today shows OD: Stable improvement in cystic changes ST mac--no edema, Partial PVD with stable improvement in vitreous opacities; OS: No DME, Mild diffuse atrophy, persistent vitreous opacities--slightly improved. Partial PVD at 4+wks **recurrent NVE with stoppage of anti-VEGF therapy** - s/p PRP OU (04.25.25) in OR due to h/o photogenic seizures -- good laser changes OU - BCVA OD: 20/25 - stable, OS 20/70 from 20/60 - recommend IVA today OU #9 today, (09.26.25) w/ f/u ext to 6 weeks - RBA of procedure discussed, questions answered - IVA informed consent obtained and signed on 7.22.25 - see procedure note  - f/u 6 weeks, DFE, OCT, possible injxn  5,6. Hypertensive retinopathy OU - discussed importance of tight BP control - monitor  7. Mixed Cataract OU - The symptoms of cataract, surgical options, and treatments and risks were discussed with patient. - discussed diagnosis and progression - under the expert management of Dr. Melany, scheduled for cataract sx 10.03.25 OS.   8. Hx of photogenic seizures  Ophthalmic Meds Ordered this visit:  Meds ordered this encounter  Medications   Bevacizumab  (AVASTIN ) SOLN 1.25 mg   Bevacizumab  (AVASTIN ) SOLN 1.25 mg     Return in about 6 weeks (around 05/30/2024) for PDR  OU, DFE, OCT, Possible Injxn.  There are no Patient Instructions on file for this visit.  This document serves as a record of services personally performed by Redell JUDITHANN Hans, MD, PhD. It was created on their behalf by Almetta Pesa, an ophthalmic technician. The creation of this record is the provider's dictation and/or activities during the visit.    Electronically signed by: Almetta Pesa, OA, 04/23/24  1:55 AM  Redell JUDITHANN Hans, M.D., Ph.D. Diseases & Surgery  of the Retina and Vitreous [facility] 04/18/2024   I have reviewed the above documentation for accuracy and completeness, and I agree with the above. Redell JUDITHANN Hans, M.D., Ph.D. 04/23/24 1:57 AM   Abbreviations: M myopia (nearsighted); A astigmatism; H hyperopia (farsighted); P presbyopia; Mrx spectacle prescription;  CTL contact lenses; OD right eye; OS left eye; OU both eyes  XT exotropia; ET esotropia; PEK punctate epithelial keratitis; PEE punctate epithelial erosions; DES dry eye syndrome; MGD meibomian gland dysfunction; ATs artificial tears; PFAT's preservative free artificial tears; NSC nuclear sclerotic cataract; PSC posterior subcapsular cataract; ERM epi-retinal membrane; PVD posterior vitreous detachment; RD retinal detachment; DM diabetes mellitus; DR diabetic retinopathy; NPDR non-proliferative diabetic retinopathy; PDR proliferative diabetic retinopathy; CSME clinically significant macular edema; DME diabetic macular edema; dbh dot blot hemorrhages; CWS cotton wool spot; POAG primary open angle glaucoma; C/D cup-to-disc ratio; HVF humphrey visual field; GVF goldmann visual field; OCT optical coherence tomography; IOP intraocular pressure; BRVO Branch retinal vein occlusion; CRVO central retinal vein occlusion; CRAO central retinal artery occlusion; BRAO branch retinal artery occlusion; RT retinal tear; SB scleral buckle; PPV pars plana vitrectomy; VH Vitreous hemorrhage; PRP panretinal laser  photocoagulation; IVK intravitreal kenalog; VMT vitreomacular traction; MH Macular hole;  NVD neovascularization of the disc; NVE neovascularization elsewhere; AREDS age related eye disease study; ARMD age related macular degeneration; POAG primary open angle glaucoma; EBMD epithelial/anterior basement membrane dystrophy; ACIOL anterior chamber intraocular lens; IOL intraocular lens; PCIOL posterior chamber intraocular lens; Phaco/IOL phacoemulsification with intraocular lens placement; PRK photorefractive keratectomy; LASIK laser assisted in situ keratomileusis; HTN hypertension; DM diabetes mellitus; COPD chronic obstructive pulmonary disease

## 2024-04-18 ENCOUNTER — Encounter (INDEPENDENT_AMBULATORY_CARE_PROVIDER_SITE_OTHER): Payer: Self-pay | Admitting: Ophthalmology

## 2024-04-18 ENCOUNTER — Ambulatory Visit (INDEPENDENT_AMBULATORY_CARE_PROVIDER_SITE_OTHER): Admitting: Ophthalmology

## 2024-04-18 DIAGNOSIS — H25813 Combined forms of age-related cataract, bilateral: Secondary | ICD-10-CM

## 2024-04-18 DIAGNOSIS — H3563 Retinal hemorrhage, bilateral: Secondary | ICD-10-CM

## 2024-04-18 DIAGNOSIS — I10 Essential (primary) hypertension: Secondary | ICD-10-CM

## 2024-04-18 DIAGNOSIS — Z7984 Long term (current) use of oral hypoglycemic drugs: Secondary | ICD-10-CM

## 2024-04-18 DIAGNOSIS — Z7985 Long-term (current) use of injectable non-insulin antidiabetic drugs: Secondary | ICD-10-CM

## 2024-04-18 DIAGNOSIS — E113593 Type 2 diabetes mellitus with proliferative diabetic retinopathy without macular edema, bilateral: Secondary | ICD-10-CM | POA: Diagnosis not present

## 2024-04-18 DIAGNOSIS — H35033 Hypertensive retinopathy, bilateral: Secondary | ICD-10-CM

## 2024-04-18 MED ORDER — BEVACIZUMAB CHEMO INJECTION 1.25MG/0.05ML SYRINGE FOR KALEIDOSCOPE
1.2500 mg | INTRAVITREAL | Status: AC | PRN
  Administered 2024-04-18: 1.25 mg via INTRAVITREAL

## 2024-04-22 NOTE — H&P (Signed)
 Surgical History & Physical  Patient Name: Jill [name]  DOB: [birth year]  Surgery: Cataract extraction with intraocular lens implant phacoemulsification; Left Eye Surgeon: Lynwood Hermann MD Surgery Date: 04/25/2024 Pre-Op Date: 03/06/2024  HPI: A 22 Yr. old female patient 1. The patient is here today for a cataract evaluation. The patient complains of difficulty when reading fine print, books, newspaper, instructions etc., which began 4 months ago. Both eyes are affected. The episode is constant. The patient describes foggy symptoms affecting their eyes/vision. This is negatively affecting the patient's quality of life and the patient is unable to function adequately in life with the current level of vision. HPI Completed by Dr. Lynwood Hermann  Medical History: Cataracts Diabetes High Blood Pressure Stroke  Review of Systems Negative Allergic/Immunologic Hypertension Cardiovascular Negative Constitutional Negative Ear, Nose, Mouth & Throat Diabetes Endocrine Negative Eyes Negative Gastrointestinal Negative Genitourinary Negative Hematologic/Lymphatic Negative Integumentary Negative Musculoskeletal Stroke Neurological Negative Psychiatry Negative Respiratory  Social Never Smoked  Medication Prednisolone -moxiflox-bromfen,  Metformin , Furosemide , Potassium chloride , Linaclotide, Clopidogrel , Pantoprazole , Bismuth, Ozempic, Metoprolol , Rosuvastatin , Cyanocobalamin, Gabapentin , Aspirin , Lorazepam , Pioglitazone  HCL, Felodipine , Docusate sodium , Levetiracetam , Olmesartan, Acetaminophen , Linzess   Sx/Procedures Kidney stone removal x 2, Gall bladder removal, Appendectomy, Gastric Bypass - 7 years ago  Drug Allergies  NKDA  History & Physical: Heent: cataracts NECK: supple without bruits LUNGS: lungs clear to auscultation CV: regular rate and rhythm Abdomen: soft and non-tender  Impression & Plan: Assessment: 1.  COMBINED FORMS AGE RELATED CATARACT; Both Eyes  (H25.813) 2.  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  OAG BORDERLINE FINDINGS LOW RISK; Both Eyes (H40.013) 4.  ASTIGMATISM, REGULAR; Both Eyes (H52.223)  Plan: 1.  Cataract accounts for the patient's decreased vision. This visual impairment is not correctable with a tolerable change in glasses or contact lenses. Cataract surgery with an implantation of a new lens should significantly improve the visual and functional status of the patient. Recommend phacoemulsification with intraocular lens. Discussed all risks, benefits, alternatives, and potential complications. Discussed the procedures and recovery. The patient desires to have surgery. A-scan/Biometry ordered and will be performed for intraocular lens calculations. The surgery will be performed in order to improve vision for driving, reading, and for eye examinations. Recommend Dextenza  for post-operative pain and inflammation. Educational materials provided: Cataract. History of corneal refractive Surgery: None History of Previous Ocular Surgery (PPV, other): Intra-vit injections. History of ocular trauma: None Use of Eye Pressure Lowering Drops: None No current contact lens use. Pupil Status: Dilates poorly - shugarcaine or Lidocaine +Omidira by protocol, Malyugin Ring Diabetes status: Retinopathy Present Left Eye. Refractive Goal: [place] Possible toric IOL  2.  Under the care of Dr. Valdemar.  3.  Based on cup-to-disc ratio. Negative Family history. IOPs WNL OU. Will do further baseline testing after cataract surgery. Detailed discussion about glaucoma today including importance of maintaining good follow up and following treatment plan, and the possibility of irreversible blindness as part of this disease process.  4.  Moderate - please run toric calculations.

## 2024-04-23 ENCOUNTER — Encounter (HOSPITAL_COMMUNITY)
Admission: RE | Admit: 2024-04-23 | Discharge: 2024-04-23 | Disposition: A | Discharge status: Home / Self Care | Source: Ambulatory Visit | Attending: Ophthalmology | Admitting: Ophthalmology

## 2024-04-23 ENCOUNTER — Other Ambulatory Visit: Payer: Self-pay

## 2024-04-23 NOTE — Progress Notes (Unsigned)
 Established Patient Pulmonology Office Visit   Subjective:  Patient ID: Jill [name], female    DOB: [birth year]  MRN: [id number]  CC: No chief complaint on file.   HPI Jill [name] is a 58 y/o F with a PMH significant for HFpEF, CVA s/p PCI for left carotid stenosis, and OSA on autoPAP who presents for follow up.  She was last seen by Dr. Jude in 03/2023 and was doing well on autoPAP.   {PULM QUESTIONNAIRES (Optional):33196}  ROS  {History (Optional):23778}  Current Outpatient Medications:    acetaminophen  (TYLENOL ) 500 MG tablet, Take 500-1,000 mg by mouth every 8 (eight) hours as needed for moderate pain or headache. , Disp: , Rfl:    aspirin  EC 81 MG tablet, Take 81 mg by mouth every morning., Disp: , Rfl:    brompheniramine-pseudoephedrine-DM 30-2-10 MG/5ML syrup, Take 5 mLs by mouth 4 (four) times daily as needed. (Patient not taking: Reported on 04/23/2024), Disp: 140 mL, Rfl: 0   Calcium -Phosphorus-Vitamin D (CITRACAL +D3 PO), Take 2 tablets by mouth in the morning and at bedtime., Disp: , Rfl:    clopidogrel  (PLAVIX ) 75 MG tablet, Take 75 mg by mouth in the morning., Disp: , Rfl:    Cyanocobalamin (VITAMIN B-12) 5000 MCG SUBL, Place 5,000 mcg under the tongue 3 (three) times a week., Disp: , Rfl:    diazepam  (VALIUM ) 5 MG tablet, Take one tablet by mouth with light food one hour prior to procedure. (Patient not taking: Reported on 04/23/2024), Disp: 1 tablet, Rfl: 0   diazePAM , 20 MG Dose, (VALTOCO  20 MG DOSE) 2 x 10 MG/0.1ML LQPK, Place 20 mg into the nose as needed (for seizure)., Disp: 1 each, Rfl: 0   docusate sodium  (COLACE) 100 MG capsule, Take 100 mg by mouth in the morning. (Patient not taking: Reported on 04/23/2024), Disp: , Rfl:    ezetimibe  (ZETIA ) 10 MG tablet, Take 1 tablet (10 mg total) by mouth daily., Disp: 90 tablet, Rfl: 3   felodipine  (PLENDIL ) 5 MG 24 hr tablet, Take 5 mg by mouth in the morning., Disp: , Rfl:    furosemide  (LASIX ) 20 MG tablet, TAKE ONE  TABLET EVERY OTHER DAY AND ONE ADDITIONAL TABLET AS NEEDED FOR SWELLING. 15 IN PILL PACK/30 IN SEPARATE BOTTLE**, Disp: 135 tablet, Rfl: 0   gabapentin  (NEURONTIN ) 300 MG capsule, Take 300 mg by mouth 2 (two) times daily., Disp: , Rfl: 0   lamoTRIgine  (LAMICTAL ) 100 MG tablet, Take 1 tablet (100 mg total) by mouth 2 (two) times daily., Disp: 180 tablet, Rfl: 3   levETIRAcetam  (KEPPRA ) 500 MG tablet, Take 250 mg by mouth 2 (two) times daily., Disp: , Rfl:    linaclotide (LINZESS) 145 MCG CAPS capsule, Take 145 mcg by mouth daily before breakfast., Disp: , Rfl:    LORazepam  (ATIVAN ) 0.5 MG tablet, Take 0.5 mg by mouth daily as needed for anxiety., Disp: , Rfl:    metFORMIN  (GLUCOPHAGE ) 500 MG tablet, Take 500 mg by mouth 2 (two) times daily., Disp: , Rfl:    metoprolol  succinate (TOPROL -XL) 25 MG 24 hr tablet, Take 1 tablet by mouth daily (Patient not taking: Reported on 04/23/2024), Disp: 90 tablet, Rfl: 2   Multiple Vitamins-Minerals (BARIATRIC MULTIVITAMINS/IRON PO), Take 1 tablet by mouth every evening., Disp: , Rfl:    olmesartan (BENICAR) 40 MG tablet, Take 40 mg by mouth every morning. , Disp: , Rfl:    OZEMPIC, 1 MG/DOSE, 2 MG/1.5ML SOPN, Inject 2 mg into the skin once a  week., Disp: , Rfl:    pantoprazole  (PROTONIX ) 40 MG tablet, Take 1 tablet (40 mg total) by mouth 2 (two) times daily., Disp: 60 tablet, Rfl: 2   pioglitazone  (ACTOS ) 30 MG tablet, Take 30 mg by mouth every morning., Disp: , Rfl:    potassium chloride  (KLOR-CON  M) 10 MEQ tablet, Take 2 tablets (20 mEq total) by mouth every other day. Take on days you take Lasix ., Disp: 90 tablet, Rfl: 3   rosuvastatin  (CRESTOR ) 40 MG tablet, Take 1 tablet (40 mg total) by mouth at bedtime., Disp: 90 tablet, Rfl: 1      Objective:  LMP 12/10/2014 Comment: pt states that there is no chance or pregnancy {Pulm Vitals (Optional):32837}  Physical Exam   Diagnostic Review:  {Labs (Optional):32838}  12/2021  HST showed very mild  OSA with  AHI 6/ hr      NPSG 08/2022 mild , AHI 7/h , esp supine REM, low sat 85% - 212 lbs    Assessment & Plan:   Assessment & Plan OSA on CPAP   No orders of the defined types were placed in this encounter.     No follow-ups on file.   Aleathea Pugmire, MD

## 2024-04-24 ENCOUNTER — Ambulatory Visit: Admitting: Cardiology

## 2024-04-24 ENCOUNTER — Ambulatory Visit: Admitting: Pulmonary Disease

## 2024-04-24 ENCOUNTER — Encounter: Payer: Self-pay | Admitting: Pulmonary Disease

## 2024-04-24 VITALS — BP 120/74 | HR 77 | Ht 66.0 in | Wt 225.2 lb

## 2024-04-24 DIAGNOSIS — F17210 Nicotine dependence, cigarettes, uncomplicated: Secondary | ICD-10-CM | POA: Diagnosis not present

## 2024-04-24 DIAGNOSIS — Z122 Encounter for screening for malignant neoplasm of respiratory organs: Secondary | ICD-10-CM | POA: Diagnosis not present

## 2024-04-24 DIAGNOSIS — G4733 Obstructive sleep apnea (adult) (pediatric): Secondary | ICD-10-CM | POA: Diagnosis not present

## 2024-04-24 DIAGNOSIS — R053 Chronic cough: Secondary | ICD-10-CM

## 2024-04-24 DIAGNOSIS — F17200 Nicotine dependence, unspecified, uncomplicated: Secondary | ICD-10-CM

## 2024-04-24 MED ORDER — ALBUTEROL SULFATE HFA 108 (90 BASE) MCG/ACT IN AERS: 2.0000 | INHALATION_SPRAY | Freq: Four times a day (QID) | RESPIRATORY_TRACT | 6 refills | Status: AC | PRN

## 2024-04-24 MED ORDER — NICOTINE 7 MG/24HR TD PT24: MEDICATED_PATCH | TRANSDERMAL | 0 refills | Status: AC

## 2024-04-24 MED ORDER — NICOTINE POLACRILEX 4 MG MT LOZG: LOZENGE | OROMUCOSAL | 0 refills | Status: AC

## 2024-04-24 MED ORDER — NICOTINE 14 MG/24HR TD PT24: MEDICATED_PATCH | TRANSDERMAL | 0 refills | Status: AC

## 2024-04-24 MED ORDER — NICOTINE 21 MG/24HR TD PT24: MEDICATED_PATCH | TRANSDERMAL | 0 refills | Status: AC

## 2024-04-24 NOTE — Assessment & Plan Note (Signed)
 She is benefiting well from the machine.  CPAP download was reviewed which shows good control of events on auto settings 5 to 12 cm with average pressure of 7 and maximum pressure of 11 cm.  Residual AHI is 2.7. CPAP has certainly helped improve her daytime somnolence and fatigue and she is very compliant, 93%.  CPAP supplies will be renewed for a year Weight loss encouraged, compliance with goal of at least 4-6 hrs every night is the expectation. Advised against medications with sedative side effects Cautioned against driving when sleepy - understanding that sleepiness will vary on a day to day basis

## 2024-04-24 NOTE — Patient Instructions (Signed)
-   Get CXR and breathing test done whenever is convenient for you - Start using albuterol inhaler 2 puffs every 4 hours as needed - Combine nicotine  patches and lozenges. 1 patch a day and 1 lozenge every 2 hours as needed whenever craving cigarettes  For Reflux: - Avoid lying down for at least two hours after a meal or after drinking acidic beverages, like soda, or other caffeinated beverages. - Keep your head elevated while you sleep. Using an extra pillow or two can also help to prevent reflux. - Eat smaller and more frequent meals each day instead of a few large meals. - Quit smoking. - Reduce excess weight around the midsection  For Sleep: - Be consistent. Go to bed at the same time each night and get up at the same time each morning, including on the weekends - Make sure your bedroom is quiet, dark, relaxing, and at a comfortable temperature - Remove electronic devices, such as TVs, computers, and smart phones, from the bedroom - Avoid large meals, caffeine, and alcohol before bedtime - Get some exercise. Being physically active during the day can help you fall asleep more easily at night.

## 2024-04-25 ENCOUNTER — Other Ambulatory Visit: Payer: Self-pay

## 2024-04-25 ENCOUNTER — Ambulatory Visit (HOSPITAL_COMMUNITY): Admitting: Anesthesiology

## 2024-04-25 ENCOUNTER — Telehealth: Payer: Self-pay | Admitting: Pulmonary Disease

## 2024-04-25 ENCOUNTER — Encounter (HOSPITAL_COMMUNITY)
Admission: RE | Disposition: A | Discharge status: Home / Self Care | Payer: Self-pay | Source: Home / Self Care | Attending: Ophthalmology

## 2024-04-25 ENCOUNTER — Ambulatory Visit (HOSPITAL_COMMUNITY)
Admission: RE | Admit: 2024-04-25 | Discharge: 2024-04-25 | Disposition: A | Discharge status: Home / Self Care | Attending: Ophthalmology | Admitting: Ophthalmology

## 2024-04-25 ENCOUNTER — Encounter (HOSPITAL_COMMUNITY): Payer: Self-pay | Admitting: Ophthalmology

## 2024-04-25 DIAGNOSIS — Z86718 Personal history of other venous thrombosis and embolism: Secondary | ICD-10-CM | POA: Diagnosis not present

## 2024-04-25 DIAGNOSIS — I10 Essential (primary) hypertension: Secondary | ICD-10-CM | POA: Diagnosis not present

## 2024-04-25 DIAGNOSIS — E1151 Type 2 diabetes mellitus with diabetic peripheral angiopathy without gangrene: Secondary | ICD-10-CM | POA: Diagnosis not present

## 2024-04-25 DIAGNOSIS — H5711 Ocular pain, right eye: Secondary | ICD-10-CM | POA: Insufficient documentation

## 2024-04-25 DIAGNOSIS — H25812 Combined forms of age-related cataract, left eye: Secondary | ICD-10-CM | POA: Diagnosis present

## 2024-04-25 DIAGNOSIS — Z7984 Long term (current) use of oral hypoglycemic drugs: Secondary | ICD-10-CM | POA: Insufficient documentation

## 2024-04-25 DIAGNOSIS — K219 Gastro-esophageal reflux disease without esophagitis: Secondary | ICD-10-CM | POA: Diagnosis not present

## 2024-04-25 DIAGNOSIS — G4730 Sleep apnea, unspecified: Secondary | ICD-10-CM | POA: Diagnosis not present

## 2024-04-25 DIAGNOSIS — E119 Type 2 diabetes mellitus without complications: Secondary | ICD-10-CM

## 2024-04-25 DIAGNOSIS — M1990 Unspecified osteoarthritis, unspecified site: Secondary | ICD-10-CM | POA: Insufficient documentation

## 2024-04-25 DIAGNOSIS — H25811 Combined forms of age-related cataract, right eye: Secondary | ICD-10-CM | POA: Insufficient documentation

## 2024-04-25 DIAGNOSIS — E1136 Type 2 diabetes mellitus with diabetic cataract: Secondary | ICD-10-CM | POA: Diagnosis not present

## 2024-04-25 DIAGNOSIS — I2510 Atherosclerotic heart disease of native coronary artery without angina pectoris: Secondary | ICD-10-CM | POA: Diagnosis not present

## 2024-04-25 HISTORY — PX: CATARACT EXTRACTION W/PHACO: SHX586

## 2024-04-25 HISTORY — PX: INSERTION, STENT, DRUG-ELUTING, LACRIMAL CANALICULUS: SHX7453

## 2024-04-25 LAB — GLUCOSE, CAPILLARY: Glucose-Capillary: 112 mg/dL — ABNORMAL HIGH (ref 70–99)

## 2024-04-25 SURGERY — PHACOEMULSIFICATION, CATARACT, WITH IOL INSERTION
Anesthesia: Monitor Anesthesia Care | Site: Eye | Laterality: Left

## 2024-04-25 MED ORDER — LACTATED RINGERS IV SOLN: INTRAVENOUS | Status: DC

## 2024-04-25 MED ORDER — POVIDONE-IODINE 5 % OP SOLN
OPHTHALMIC | Status: DC | PRN
  Administered 2024-04-25: 1 via OPHTHALMIC

## 2024-04-25 MED ORDER — MOXIFLOXACIN HCL 5 MG/ML IO SOLN
INTRAOCULAR | Status: DC | PRN
  Administered 2024-04-25: .2 mL via INTRACAMERAL

## 2024-04-25 MED ORDER — MIDAZOLAM HCL 2 MG/2ML IJ SOLN
INTRAMUSCULAR | Status: DC | PRN
Start: 2024-04-25 — End: 2024-04-25
  Administered 2024-04-25: 2 mg via INTRAVENOUS

## 2024-04-25 MED ORDER — STERILE WATER FOR IRRIGATION IR SOLN
Status: DC | PRN
  Administered 2024-04-25: 1

## 2024-04-25 MED ORDER — LIDOCAINE HCL (PF) 1 % IJ SOLN
INTRAMUSCULAR | Status: DC | PRN
  Administered 2024-04-25: 1 mL

## 2024-04-25 MED ORDER — BSS IO SOLN
INTRAOCULAR | Status: DC | PRN
  Administered 2024-04-25: 15 mL via INTRAOCULAR

## 2024-04-25 MED ORDER — LIDOCAINE HCL 3.5 % OP GEL
1.0000 | Freq: Once | OPHTHALMIC | Status: AC
  Administered 2024-04-25: 1 via OPHTHALMIC

## 2024-04-25 MED ORDER — MIDAZOLAM HCL 2 MG/2ML IJ SOLN
INTRAMUSCULAR | Status: AC
  Filled 2024-04-25: qty 2

## 2024-04-25 MED ORDER — TROPICAMIDE 1 % OP SOLN
1.0000 [drp] | OPHTHALMIC | Status: AC | PRN
  Administered 2024-04-25 (×3): 1 [drp] via OPHTHALMIC

## 2024-04-25 MED ORDER — PHENYLEPHRINE HCL 2.5 % OP SOLN
1.0000 [drp] | OPHTHALMIC | Status: AC | PRN
  Administered 2024-04-25 (×3): 1 [drp] via OPHTHALMIC

## 2024-04-25 MED ORDER — PHENYLEPHRINE-KETOROLAC 1-0.3 % IO SOLN
INTRAOCULAR | Status: DC | PRN
  Administered 2024-04-25: 500 mL via OPHTHALMIC

## 2024-04-25 MED ORDER — TETRACAINE HCL 0.5 % OP SOLN
1.0000 [drp] | OPHTHALMIC | Status: AC | PRN
  Administered 2024-04-25 (×3): 1 [drp] via OPHTHALMIC

## 2024-04-25 MED ORDER — SODIUM CHLORIDE 0.9% FLUSH
INTRAVENOUS | Status: DC | PRN
  Administered 2024-04-25: 3 mL via INTRAVENOUS

## 2024-04-25 MED ORDER — SODIUM HYALURONATE 10 MG/ML IO SOLUTION
PREFILLED_SYRINGE | INTRAOCULAR | Status: DC | PRN
  Administered 2024-04-25: .85 mL via INTRAOCULAR

## 2024-04-25 MED ORDER — DEXAMETHASONE 0.4 MG OP INST
VAGINAL_INSERT | OPHTHALMIC | Status: DC | PRN
  Administered 2024-04-25: .4 mg via OPHTHALMIC

## 2024-04-25 MED ORDER — DEXAMETHASONE 0.4 MG OP INST
VAGINAL_INSERT | OPHTHALMIC | Status: AC
  Filled 2024-04-25: qty 1

## 2024-04-25 MED ORDER — SODIUM HYALURONATE 23MG/ML IO SOSY
PREFILLED_SYRINGE | INTRAOCULAR | Status: DC | PRN
  Administered 2024-04-25: .6 mL via INTRAOCULAR

## 2024-04-25 SURGICAL SUPPLY — 12 items
CLOTH BEACON ORANGE TIMEOUT ST (SAFETY) ×1 IMPLANT
EYE SHIELD UNIVERSAL CLEAR (GAUZE/BANDAGES/DRESSINGS) IMPLANT
FEE CATARACT SUITE SIGHTPATH (MISCELLANEOUS) ×1 IMPLANT
GLOVE BIOGEL PI IND STRL 7.0 (GLOVE) ×2 IMPLANT
LENS IOL TECNIS EYHANCE 19.5 (Intraocular Lens) IMPLANT
NDL HYPO 18GX1.5 BLUNT FILL (NEEDLE) ×1 IMPLANT
NEEDLE HYPO 18GX1.5 BLUNT FILL (NEEDLE) ×1 IMPLANT
PAD ARMBOARD POSITIONER FOAM (MISCELLANEOUS) ×1 IMPLANT
SYR TB 1ML LL NO SAFETY (SYRINGE) ×1 IMPLANT
TAPE SURG TRANSPORE 1 IN (GAUZE/BANDAGES/DRESSINGS) IMPLANT
TIP IRRIGATON/ASPIRATION (MISCELLANEOUS) IMPLANT
WATER STERILE IRR 250ML POUR (IV SOLUTION) ×1 IMPLANT

## 2024-04-25 NOTE — Op Note (Deleted)
 Date of procedure: 04/25/24  Pre-operative diagnosis:  Visually significant combined form age-related cataract, Right Eye (H25.811)  Post-operative diagnosis:   1. Visually significant combined form age-related cataract, Right Eye (H25.811) 2. Pain and inflammation following cataract surgery Right Eye (H57.11)  Procedure:  Removal of cataract via phacoemulsification and insertion of intra-ocular lens [employer] DIB00 +19.5D into the capsular bag of the Right Eye 2. Placement of Dextenza  insert, Right Eye  Attending surgeon: Lynwood LABOR. Shreeya Recendiz, MD, MA  Anesthesia: MAC, Topical [place]  Complications: None  Estimated Blood Loss: <30mL (minimal)  Specimens: None  Implants: As above  Indications:  Visually significant age-related cataract, Right Eye  Procedure:  The patient was seen and identified in the pre-operative area. The operative eye was identified and dilated.  The operative eye was marked.  Topical anesthesia was administered to the operative eye.     The patient was then to the operative suite and placed in the supine position.  A timeout was performed confirming the patient, procedure to be performed, and all other relevant information.   The patient's face was prepped and draped in the usual fashion for intra-ocular surgery.  A lid speculum was placed into the operative eye and the surgical microscope moved into place and focused.  A superotemporal paracentesis was created using a 20 gauge paracentesis blade. Omidria was injected into the anterior chamber. Shugarcaine was injected into the anterior chamber.  Viscoelastic was injected into the anterior chamber.  A temporal clear-corneal main wound incision was created using a 2.9mm microkeratome.  A continuous curvilinear capsulorrhexis was initiated using an irrigating cystitome and completed using capsulorrhexis forceps.  Hydrodissection and hydrodeliniation were performed.  Viscoelastic was injected into the anterior  chamber.  A phacoemulsification handpiece and a chopper as a second instrument were used to remove the nucleus and epinucleus. The irrigation/aspiration handpiece was used to remove any remaining cortical material.   The capsular bag was reinflated with viscoelastic, checked, and found to be intact.  The intraocular lens was inserted into the capsular bag.  The irrigation/aspiration handpiece was used to remove any remaining viscoelastic.  The clear corneal wound and paracentesis wounds were then hydrated and checked with Weck-Cels to be watertight. 0.1mL of moxifloxacin was injected into the anterior chamber.  The lid-speculum was removed. The lower punctum was dilated. A Dextenza  implant was placed in the lower canaliculus without complication.  The drape was removed.  The patient's face was cleaned with a wet and dry 4x4. A clear shield was taped over the eye. The patient was taken to the post-operative care unit in good condition, having tolerated the procedure well.  Post-Op Instructions: The patient will follow up at [facility] for a same day post-operative evaluation and will receive all other orders and instructions.

## 2024-04-25 NOTE — Anesthesia Procedure Notes (Signed)
 Date/Time: 04/25/2024 11:26 AM  Performed by: Barbarann Verneita RAMAN, CRNAPre-anesthesia Checklist: Patient identified, Emergency Drugs available, Suction available, Timeout performed and Patient being monitored Patient Re-evaluated:Patient Re-evaluated prior to induction Oxygen Delivery Method: Nasal Cannula

## 2024-04-25 NOTE — Telephone Encounter (Signed)
 Spoke with patent regarding the Thursday 07/10/24 1:00pm PFT appointment at [facility] time is 12:45 pm--1st floor registration desk for check in---will mail information to patient and she voiced her understanding

## 2024-04-25 NOTE — Anesthesia Postprocedure Evaluation (Signed)
 Anesthesia Post Note  Patient: Jill [name]  Procedure(s) Performed: PHACOEMULSIFICATION, CATARACT, WITH IOL INSERTION (Left: Eye) INSERTION, STENT, DRUG-ELUTING, LACRIMAL CANALICULUS (Left: Eye)  Patient location during evaluation: PACU Anesthesia Type: MAC Level of consciousness: awake and alert Pain management: pain level controlled Vital Signs Assessment: post-procedure vital signs reviewed and stable Respiratory status: spontaneous breathing, nonlabored ventilation, respiratory function stable and patient connected to nasal cannula oxygen Cardiovascular status: stable and blood pressure returned to baseline Postop Assessment: no apparent nausea or vomiting Anesthetic complications: no   There were no known notable events for this encounter.   Last Vitals:  Vitals:   04/25/24 1043 04/25/24 1152  BP: (!) 146/63 (!) 159/84  Pulse: (!) 58 75  Resp: 17 18  Temp: 36.8 C 36.7 C  SpO2: 99% 100%    Last Pain:  Vitals:   04/25/24 1152  TempSrc: Oral  PainSc: 0-No pain                 [name]

## 2024-04-25 NOTE — Anesthesia Preprocedure Evaluation (Signed)
 Anesthesia Evaluation  Patient identified by MRN, date of birth, ID band Patient awake    Reviewed: Allergy & Precautions, NPO status , Patient's Chart, lab work & pertinent test results, reviewed documented beta blocker date and time   History of Anesthesia Complications (+) PONV and history of anesthetic complications  Airway Mallampati: II  TM Distance: >3 FB Neck ROM: Full    Dental  (+) Teeth Intact, Dental Advisory Given   Pulmonary sleep apnea and Continuous Positive Airway Pressure Ventilation , Current SmokerPatient did not abstain from smoking.   Pulmonary exam normal breath sounds clear to auscultation       Cardiovascular hypertension, Pt. on medications and Pt. on home beta blockers + CAD, + Peripheral Vascular Disease and + DVT  Normal cardiovascular exam Rhythm:Regular Rate:Normal     Neuro/Psych Seizures -, Well Controlled,  PSYCHIATRIC DISORDERS Anxiety     Carotid stenosis  Neuromuscular disease CVA    GI/Hepatic Neg liver ROS,GERD  Medicated,,  Endo/Other  diabetes, Type 2, Oral Hypoglycemic Agents    Renal/GU Renal InsufficiencyRenal disease     Musculoskeletal  (+) Arthritis ,    Abdominal   Peds  Hematology negative hematology ROS (+)   Anesthesia Other Findings Day of surgery medications reviewed with the patient.  Reproductive/Obstetrics                              Anesthesia Physical Anesthesia Plan  ASA: 3  Anesthesia Plan: MAC   Post-op Pain Management:    Induction:   PONV Risk Score and Plan:   Airway Management Planned: Nasal Cannula and Natural Airway  Additional Equipment: None  Intra-op Plan:   Post-operative Plan:   Informed Consent: I have reviewed the patients History and Physical, chart, labs and discussed the procedure including the risks, benefits and alternatives for the proposed anesthesia with the patient or authorized  representative who has indicated his/her understanding and acceptance.     Dental advisory given  Plan Discussed with: CRNA  Anesthesia Plan Comments: ( History of difficult needle stick.   )        Anesthesia Quick Evaluation

## 2024-04-25 NOTE — Transfer of Care (Signed)
 Immediate Anesthesia Transfer of Care Note  Patient: Jill [name]  Procedure(s) Performed: PHACOEMULSIFICATION, CATARACT, WITH IOL INSERTION (Left: Eye) INSERTION, STENT, DRUG-ELUTING, LACRIMAL CANALICULUS (Left: Eye)  Patient Location: Short Stay  Anesthesia Type:MAC  Level of Consciousness: awake and patient cooperative  Airway & Oxygen Therapy: Patient Spontanous Breathing  Post-op Assessment: Report given to RN and Post -op Vital signs reviewed and stable  Post vital signs: Reviewed and stable  Last Vitals:  Vitals Value Taken Time  BP 159/84 04/25/24 11:52  Temp 36.7 C 04/25/24 11:52  Pulse 75 04/25/24 11:52  Resp 18 04/25/24 11:52  SpO2 100 % 04/25/24 11:52    Last Pain:  Vitals:   04/25/24 1152  TempSrc: Oral  PainSc:          Complications: No notable events documented.

## 2024-04-25 NOTE — Interval H&P Note (Signed)
 History and Physical Interval Note:  04/25/2024 11:16 AM  Jill [name]  has presented today for surgery, with the diagnosis of combined forms age related cataract, left eye.  The various methods of treatment have been discussed with the patient and family. After consideration of risks, benefits and other options for treatment, the patient has consented to  Procedure(s): PHACOEMULSIFICATION, CATARACT, WITH IOL INSERTION (Left) as a surgical intervention.  The patient's history has been reviewed, patient examined, no change in status, stable for surgery.  I have reviewed the patient's chart and labs.  Questions were answered to the patient's satisfaction.     HARRIE AGENT

## 2024-04-25 NOTE — Discharge Instructions (Signed)
 Please discharge patient when stable, will follow up today with Dr. June Leap at the [facility] office immediately following discharge.  Leave shield in place until visit.  All paperwork with discharge instructions will be given at the office.  [facility] Address:  [address]  [place], [place] [address]

## 2024-04-30 ENCOUNTER — Ambulatory Visit (HOSPITAL_COMMUNITY)
Admission: RE | Admit: 2024-04-30 | Discharge: 2024-04-30 | Disposition: A | Discharge status: Home / Self Care | Source: Ambulatory Visit | Attending: Pulmonary Disease | Admitting: Pulmonary Disease

## 2024-04-30 DIAGNOSIS — R053 Chronic cough: Secondary | ICD-10-CM | POA: Insufficient documentation

## 2024-05-02 ENCOUNTER — Ambulatory Visit (HOSPITAL_BASED_OUTPATIENT_CLINIC_OR_DEPARTMENT_OTHER): Payer: Self-pay | Admitting: Pulmonary Disease

## 2024-05-05 ENCOUNTER — Encounter (HOSPITAL_COMMUNITY): Payer: Self-pay

## 2024-05-05 ENCOUNTER — Encounter (HOSPITAL_COMMUNITY)
Admission: RE | Admit: 2024-05-05 | Discharge: 2024-05-05 | Disposition: A | Discharge status: Home / Self Care | Source: Ambulatory Visit | Attending: Ophthalmology | Admitting: Ophthalmology

## 2024-05-05 NOTE — H&P (Signed)
 Surgical History & Physical  Patient Name: Jill [name]  DOB: 1965/08/12  Surgery: Cataract extraction with intraocular lens implant phacoemulsification; Right Eye Surgeon: Lynwood Hermann MD Surgery Date: 05/09/2024 Pre-Op Date: 05/01/2024  HPI: A 50 Yr. old female patient 1. The patient is returning after cataract surgery. The left eye is affected. Status post cataract surgery, which began 1 weeks ago: Since the last visit, the affected area feels improvement and is doing well. The patient's vision is improved. The patient experiences no diplopia. The condition's severity is constant. Patient is following medication instructions TID OS, LI this AM. OD is blurry- pt wants PCIOL sx OD. C/O glare day and night, poor night [place], hazy/blurry [place], hard to see small print and captions on TV. Worsening and constant. This is negatively affecting the patient's quality of life and the patient is unable to function adequately in life with the current level of vision. HPI was performed by Lynwood Hermann .  Medical History: PCIOL OS Cataract  Diabetes High Blood Pressure Stroke  Review of Systems Negative Allergic/Immunologic Hypertension Cardiovascular Negative Constitutional Negative Ear, Nose, Mouth & Throat Diabetes Endocrine Negative Eyes Negative Gastrointestinal Negative Genitourinary Negative Hematologic/Lymphatic Negative Integumentary Negative Musculoskeletal Stroke Neurological Negative Psychiatry Negative Respiratory  Social Current every day smoker  Medication Prednisolone -moxiflox-bromfen,  Metformin , Furosemide , Potassium chloride , Linaclotide, Clopidogrel , Pantoprazole , Bismuth, Ozempic, Metoprolol , Rosuvastatin , Cyanocobalamin, Gabapentin , Aspirin , Lorazepam , Pioglitazone  HCL, Felodipine , Docusate sodium , Levetiracetam , Olmesartan, Acetaminophen , Linzess  Sx/Procedures Phaco c IOL OS,  Kidney stone removal x 2, Gall bladder removal, Appendectomy, Gastric Bypass - 7 years  ago  Drug Allergies NKDA  History & Physical: Heent: cataract NECK: supple without bruits LUNGS: lungs clear to auscultation CV: regular rate and rhythm Abdomen: soft and non-tender  Impression & Plan: Assessment: 1.  CATARACT EXTRACTION STATUS; Left Eye (Z98.42) 2.  COMBINED FORMS AGE RELATED CATARACT; Right Eye (H25.811)  Plan: 1.  1 week after cataract surgery. Doing well with improved vision and normal eye pressure. Call with any problems or concerns. Continue Pred-Moxi-Brom 2x/day for 1 more week.  2.  Cataract accounts for the patient's decreased vision. This visual impairment is not correctable with a tolerable change in glasses or contact lenses. Cataract surgery with an implantation of a new lens should significantly improve the visual and functional status of the patient. Recommend phacoemulsification with intraocular lens. Discussed all risks, benefits, alternatives, and potential complications. Discussed the procedures and recovery. The patient desires to have surgery. A-scan/Biometry ordered and will be performed for intraocular lens calculations. The surgery will be performed in order to improve vision for driving, reading, and for eye examinations. Recommend Dextenza  for post-operative pain and inflammation. Educational materials provided: Cataract. History of corneal refractive Surgery: None History of Previous Ocular Surgery (PPV, other): Intra-vit injections. History of ocular trauma: None Use of Eye Pressure Lowering Drops: None No current contact lens use. Pupil Status: Dilates poorly - shugarcaine or Lidocaine +Omidira by protocol, Malyugin Ring Diabetes status: Retinopathy Present Right Eye Refractive Goal: [place]

## 2024-05-07 ENCOUNTER — Encounter (INDEPENDENT_AMBULATORY_CARE_PROVIDER_SITE_OTHER): Payer: Self-pay | Admitting: Gastroenterology

## 2024-05-09 ENCOUNTER — Ambulatory Visit (HOSPITAL_COMMUNITY): Admitting: Anesthesiology

## 2024-05-09 ENCOUNTER — Encounter (HOSPITAL_COMMUNITY)
Admission: RE | Disposition: A | Discharge status: Home / Self Care | Payer: Self-pay | Source: Home / Self Care | Attending: Ophthalmology

## 2024-05-09 ENCOUNTER — Ambulatory Visit (HOSPITAL_COMMUNITY)
Admission: RE | Admit: 2024-05-09 | Discharge: 2024-05-09 | Disposition: A | Discharge status: Home / Self Care | Attending: Ophthalmology | Admitting: Ophthalmology

## 2024-05-09 ENCOUNTER — Encounter (HOSPITAL_COMMUNITY): Payer: Self-pay | Admitting: Ophthalmology

## 2024-05-09 DIAGNOSIS — I2510 Atherosclerotic heart disease of native coronary artery without angina pectoris: Secondary | ICD-10-CM | POA: Diagnosis not present

## 2024-05-09 DIAGNOSIS — K219 Gastro-esophageal reflux disease without esophagitis: Secondary | ICD-10-CM | POA: Insufficient documentation

## 2024-05-09 DIAGNOSIS — Z7985 Long-term (current) use of injectable non-insulin antidiabetic drugs: Secondary | ICD-10-CM | POA: Diagnosis not present

## 2024-05-09 DIAGNOSIS — F419 Anxiety disorder, unspecified: Secondary | ICD-10-CM | POA: Diagnosis not present

## 2024-05-09 DIAGNOSIS — H5711 Ocular pain, right eye: Secondary | ICD-10-CM | POA: Insufficient documentation

## 2024-05-09 DIAGNOSIS — Z86718 Personal history of other venous thrombosis and embolism: Secondary | ICD-10-CM | POA: Diagnosis not present

## 2024-05-09 DIAGNOSIS — Z7984 Long term (current) use of oral hypoglycemic drugs: Secondary | ICD-10-CM | POA: Insufficient documentation

## 2024-05-09 DIAGNOSIS — E11319 Type 2 diabetes mellitus with unspecified diabetic retinopathy without macular edema: Secondary | ICD-10-CM | POA: Insufficient documentation

## 2024-05-09 DIAGNOSIS — I10 Essential (primary) hypertension: Secondary | ICD-10-CM | POA: Diagnosis not present

## 2024-05-09 DIAGNOSIS — G709 Myoneural disorder, unspecified: Secondary | ICD-10-CM | POA: Diagnosis not present

## 2024-05-09 DIAGNOSIS — F17200 Nicotine dependence, unspecified, uncomplicated: Secondary | ICD-10-CM | POA: Diagnosis not present

## 2024-05-09 DIAGNOSIS — Z9842 Cataract extraction status, left eye: Secondary | ICD-10-CM | POA: Insufficient documentation

## 2024-05-09 DIAGNOSIS — I6529 Occlusion and stenosis of unspecified carotid artery: Secondary | ICD-10-CM | POA: Insufficient documentation

## 2024-05-09 DIAGNOSIS — E1136 Type 2 diabetes mellitus with diabetic cataract: Secondary | ICD-10-CM | POA: Insufficient documentation

## 2024-05-09 DIAGNOSIS — Z8673 Personal history of transient ischemic attack (TIA), and cerebral infarction without residual deficits: Secondary | ICD-10-CM | POA: Insufficient documentation

## 2024-05-09 DIAGNOSIS — Z7901 Long term (current) use of anticoagulants: Secondary | ICD-10-CM | POA: Insufficient documentation

## 2024-05-09 DIAGNOSIS — E1151 Type 2 diabetes mellitus with diabetic peripheral angiopathy without gangrene: Secondary | ICD-10-CM | POA: Insufficient documentation

## 2024-05-09 DIAGNOSIS — M1990 Unspecified osteoarthritis, unspecified site: Secondary | ICD-10-CM | POA: Diagnosis not present

## 2024-05-09 DIAGNOSIS — H25811 Combined forms of age-related cataract, right eye: Secondary | ICD-10-CM | POA: Insufficient documentation

## 2024-05-09 HISTORY — PX: INSERTION, STENT, DRUG-ELUTING, LACRIMAL CANALICULUS: SHX7453

## 2024-05-09 HISTORY — PX: CATARACT EXTRACTION W/PHACO: SHX586

## 2024-05-09 LAB — GLUCOSE, CAPILLARY: Glucose-Capillary: 88 mg/dL (ref 70–99)

## 2024-05-09 SURGERY — PHACOEMULSIFICATION, CATARACT, WITH IOL INSERTION
Anesthesia: Monitor Anesthesia Care | Site: Eye | Laterality: Right

## 2024-05-09 MED ORDER — STERILE WATER FOR IRRIGATION IR SOLN
Status: DC | PRN
  Administered 2024-05-09: 1

## 2024-05-09 MED ORDER — LIDOCAINE HCL 3.5 % OP GEL
1.0000 | Freq: Once | OPHTHALMIC | Status: AC
  Administered 2024-05-09: 1 via OPHTHALMIC

## 2024-05-09 MED ORDER — TROPICAMIDE 1 % OP SOLN
1.0000 [drp] | OPHTHALMIC | Status: AC | PRN
  Administered 2024-05-09 (×3): 1 [drp] via OPHTHALMIC

## 2024-05-09 MED ORDER — PHENYLEPHRINE-KETOROLAC 1-0.3 % IO SOLN
INTRAOCULAR | Status: DC | PRN
  Administered 2024-05-09: 500 mL via OPHTHALMIC

## 2024-05-09 MED ORDER — SODIUM CHLORIDE 0.9% FLUSH
INTRAVENOUS | Status: DC | PRN
  Administered 2024-05-09: 7 mL via INTRAVENOUS

## 2024-05-09 MED ORDER — LACTATED RINGERS IV SOLN: INTRAVENOUS | Status: DC

## 2024-05-09 MED ORDER — MIDAZOLAM HCL 5 MG/5ML IJ SOLN
INTRAMUSCULAR | Status: DC | PRN
  Administered 2024-05-09: 1.5 mg via INTRAVENOUS

## 2024-05-09 MED ORDER — PHENYLEPHRINE HCL 2.5 % OP SOLN
1.0000 [drp] | OPHTHALMIC | Status: AC | PRN
  Administered 2024-05-09 (×3): 1 [drp] via OPHTHALMIC

## 2024-05-09 MED ORDER — BSS IO SOLN
INTRAOCULAR | Status: DC | PRN
  Administered 2024-05-09: 15 mL via INTRAOCULAR

## 2024-05-09 MED ORDER — MOXIFLOXACIN HCL 5 MG/ML IO SOLN
INTRAOCULAR | Status: DC | PRN
  Administered 2024-05-09: .3 mL via INTRACAMERAL

## 2024-05-09 MED ORDER — MIDAZOLAM HCL 2 MG/2ML IJ SOLN
INTRAMUSCULAR | Status: AC
  Filled 2024-05-09: qty 2

## 2024-05-09 MED ORDER — TETRACAINE HCL 0.5 % OP SOLN
1.0000 [drp] | OPHTHALMIC | Status: AC | PRN
  Administered 2024-05-09 (×3): 1 [drp] via OPHTHALMIC

## 2024-05-09 MED ORDER — POVIDONE-IODINE 5 % OP SOLN
OPHTHALMIC | Status: DC | PRN
  Administered 2024-05-09: 1 via OPHTHALMIC

## 2024-05-09 MED ORDER — SODIUM HYALURONATE 10 MG/ML IO SOLUTION
PREFILLED_SYRINGE | INTRAOCULAR | Status: DC | PRN
  Administered 2024-05-09: .85 mL via INTRAOCULAR

## 2024-05-09 MED ORDER — LIDOCAINE HCL (PF) 1 % IJ SOLN
INTRAMUSCULAR | Status: DC | PRN
  Administered 2024-05-09: 1 mL

## 2024-05-09 MED ORDER — DEXAMETHASONE 0.4 MG OP INST
VAGINAL_INSERT | OPHTHALMIC | Status: DC | PRN
  Administered 2024-05-09: .4 mg via OPHTHALMIC

## 2024-05-09 MED ADMIN — Sodium Hyaluronate Intraocular Soln Pref Syr 13.8 MG/0.6ML: .6 mL | INTRAOCULAR | NDC 8544508321

## 2024-05-09 MED FILL — Dexamethasone (Ophth) Insert 0.4 MG: OPHTHALMIC | Qty: 1 | Status: AC

## 2024-05-09 SURGICAL SUPPLY — 12 items
CLOTH BEACON ORANGE TIMEOUT ST (SAFETY) ×1 IMPLANT
EYE SHIELD UNIVERSAL CLEAR (GAUZE/BANDAGES/DRESSINGS) IMPLANT
FEE CATARACT SUITE SIGHTPATH (MISCELLANEOUS) ×1 IMPLANT
GLOVE BIOGEL PI IND STRL 7.0 (GLOVE) ×2 IMPLANT
LENS IOL TECNIS EYHANCE 19.5 (Intraocular Lens) IMPLANT
NDL HYPO 18GX1.5 BLUNT FILL (NEEDLE) ×1 IMPLANT
NEEDLE HYPO 18GX1.5 BLUNT FILL (NEEDLE) ×1 IMPLANT
PAD ARMBOARD POSITIONER FOAM (MISCELLANEOUS) ×1 IMPLANT
SYR TB 1ML LL NO SAFETY (SYRINGE) ×1 IMPLANT
TAPE SURG TRANSPORE 1 IN (GAUZE/BANDAGES/DRESSINGS) IMPLANT
TIP IRRIGATON/ASPIRATION (MISCELLANEOUS) IMPLANT
WATER STERILE IRR 250ML POUR (IV SOLUTION) ×1 IMPLANT

## 2024-05-09 NOTE — Transfer of Care (Signed)
 Immediate Anesthesia Transfer of Care Note  Patient: Jill [name]  Procedure(s) Performed: PHACOEMULSIFICATION, CATARACT, WITH IOL INSERTION (Right: Eye) INSERTION, STENT, DRUG-ELUTING, LACRIMAL CANALICULUS (Right: Eye)  Patient Location: Short Stay  Anesthesia Type:MAC  Level of Consciousness: awake  Airway & Oxygen Therapy: Patient Spontanous Breathing  Post-op Assessment: Report given to RN  Post vital signs: Reviewed and stable  Last Vitals:  Vitals Value Taken Time  BP    Temp    Pulse    Resp    SpO2      Last Pain:  Vitals:   05/09/24 0817  TempSrc: Oral  PainSc: 0-No pain      Patients Stated Pain Goal: 5 (05/09/24 0817)  Complications: No notable events documented.

## 2024-05-09 NOTE — Discharge Instructions (Addendum)
 Please discharge patient when stable, will follow up today with Dr. June Leap at the [facility] office immediately following discharge.  Leave shield in place until visit.  All paperwork with discharge instructions will be given at the office.  [facility] Address:  [address]  [place], [place] [address]

## 2024-05-09 NOTE — Anesthesia Postprocedure Evaluation (Signed)
 Anesthesia Post Note  Patient: DYANN GOODSPEED  Procedure(s) Performed: PHACOEMULSIFICATION, CATARACT, WITH IOL INSERTION (Right: Eye) INSERTION, STENT, DRUG-ELUTING, LACRIMAL CANALICULUS (Right: Eye)  Patient location during evaluation: Short Stay Anesthesia Type: MAC Level of consciousness: awake and alert Pain management: pain level controlled Vital Signs Assessment: post-procedure vital signs reviewed and stable Respiratory status: spontaneous breathing Cardiovascular status: blood pressure returned to baseline and stable Postop Assessment: no apparent nausea or vomiting Anesthetic complications: no   No notable events documented.   Last Vitals:  Vitals:   05/09/24 0953 05/09/24 0957  BP: (!) 134/114 (!) 150/79  Pulse: 69   Resp: 15   Temp: 36.7 C   SpO2: 100%     Last Pain:  Vitals:   05/09/24 0953  TempSrc: Oral  PainSc: 0-No pain                 Valita Righter

## 2024-05-09 NOTE — Interval H&P Note (Signed)
 History and Physical Interval Note:  05/09/2024 9:21 AM  Jill [name]  has presented today for surgery, with the diagnosis of combined forms age related cataract, right eye.  The various methods of treatment have been discussed with the patient and family. After consideration of risks, benefits and other options for treatment, the patient has consented to  Procedure(s) with comments: PHACOEMULSIFICATION, CATARACT, WITH IOL INSERTION (Right) - Pt can't be here before 8:45am d/t transportation as a surgical intervention.  The patient's history has been reviewed, patient examined, no change in status, stable for surgery.  I have reviewed the patient's chart and labs.  Questions were answered to the patient's satisfaction.     HARRIE AGENT

## 2024-05-09 NOTE — Anesthesia Preprocedure Evaluation (Signed)
 Anesthesia Evaluation  Patient identified by MRN, date of birth, ID band Patient awake    Reviewed: Allergy & Precautions, NPO status , Patient's Chart, lab work & pertinent test results, reviewed documented beta blocker date and time   History of Anesthesia Complications (+) PONV and history of anesthetic complications  Airway Mallampati: II  TM Distance: >3 FB Neck ROM: Full    Dental  (+) Teeth Intact, Dental Advisory Given   Pulmonary sleep apnea and Continuous Positive Airway Pressure Ventilation , Current Smoker and Patient abstained from smoking.   Pulmonary exam normal breath sounds clear to auscultation       Cardiovascular hypertension, Pt. on medications and Pt. on home beta blockers + CAD, + Peripheral Vascular Disease and + DVT  Normal cardiovascular exam Rhythm:Regular Rate:Normal     Neuro/Psych Seizures -, Well Controlled,  PSYCHIATRIC DISORDERS Anxiety     Carotid stenosis  Neuromuscular disease CVA    GI/Hepatic Neg liver ROS,GERD  Medicated,,  Endo/Other  diabetes, Type 2, Oral Hypoglycemic Agents    Renal/GU Renal InsufficiencyRenal disease     Musculoskeletal  (+) Arthritis ,    Abdominal   Peds  Hematology negative hematology ROS (+)   Anesthesia Other Findings Day of surgery medications reviewed with the patient.  Reproductive/Obstetrics                              Anesthesia Physical Anesthesia Plan  ASA: 3  Anesthesia Plan: MAC   Post-op Pain Management:    Induction:   PONV Risk Score and Plan:   Airway Management Planned: Nasal Cannula and Natural Airway  Additional Equipment: None  Intra-op Plan:   Post-operative Plan:   Informed Consent: I have reviewed the patients History and Physical, chart, labs and discussed the procedure including the risks, benefits and alternatives for the proposed anesthesia with the patient or authorized  representative who has indicated his/her understanding and acceptance.     Dental advisory given  Plan Discussed with: CRNA  Anesthesia Plan Comments: ( History of difficult needle stick.   )        Anesthesia Quick Evaluation

## 2024-05-09 NOTE — Op Note (Signed)
 Date of procedure: 05/09/24  Pre-operative diagnosis:  Visually significant combined form age-related cataract, Right Eye (H25.811)  Post-operative diagnosis:   1. Visually significant combined form age-related cataract, Right Eye (H25.811) 2. Pain and inflammation following cataract surgery Right Eye (H57.11)  Procedure:  Removal of cataract via phacoemulsification and insertion of intra-ocular lens [employer] DIB00 +19.5D into the capsular bag of the Right Eye 2. Placement of Dextenza  insert, Right Eye  Attending surgeon: Lynwood LABOR. Iman Reinertsen, MD, MA  Anesthesia: MAC, Topical Akten  Complications: None  Estimated Blood Loss: <2mL (minimal)  Specimens: None  Implants: As above  Indications:  Visually significant age-related cataract, Right Eye  Procedure:  The patient was seen and identified in the pre-operative area. The operative eye was identified and dilated.  The operative eye was marked.  Topical anesthesia was administered to the operative eye.     The patient was then to the operative suite and placed in the supine position.  A timeout was performed confirming the patient, procedure to be performed, and all other relevant information.   The patient's face was prepped and draped in the usual fashion for intra-ocular surgery.  A lid speculum was placed into the operative eye and the surgical microscope moved into place and focused.  A superotemporal paracentesis was created using a 20 gauge paracentesis blade. Omidria was injected into the anterior chamber. Shugarcaine was injected into the anterior chamber.  Viscoelastic was injected into the anterior chamber.  A temporal clear-corneal main wound incision was created using a 2.58mm microkeratome.  A continuous curvilinear capsulorrhexis was initiated using an irrigating cystitome and completed using capsulorrhexis forceps.  Hydrodissection and hydrodeliniation were performed.  Viscoelastic was injected into the anterior  chamber.  A phacoemulsification handpiece and a chopper as a second instrument were used to remove the nucleus and epinucleus. The irrigation/aspiration handpiece was used to remove any remaining cortical material.   The capsular bag was reinflated with viscoelastic, checked, and found to be intact.  The intraocular lens was inserted into the capsular bag.  The irrigation/aspiration handpiece was used to remove any remaining viscoelastic.  The clear corneal wound and paracentesis wounds were then hydrated and checked with Weck-Cels to be watertight. 0.1mL of moxifloxacin was injected into the anterior chamber.  The lid-speculum was removed. The lower punctum was dilated. A Dextenza  implant was placed in the lower canaliculus without complication.  The drape was removed.  The patient's face was cleaned with a wet and dry 4x4. A clear shield was taped over the eye. The patient was taken to the post-operative care unit in good condition, having tolerated the procedure well.  Post-Op Instructions: The patient will follow up at [facility] for a same day post-operative evaluation and will receive all other orders and instructions.

## 2024-05-12 ENCOUNTER — Ambulatory Visit: Admitting: Neurology

## 2024-05-12 ENCOUNTER — Encounter: Payer: Self-pay | Admitting: Neurology

## 2024-05-12 ENCOUNTER — Telehealth: Payer: Self-pay | Admitting: Neurology

## 2024-05-12 NOTE — Telephone Encounter (Signed)
 Pt called to cancel appt , due to death in family  APPT  Rescheduled

## 2024-05-15 ENCOUNTER — Telehealth: Payer: Self-pay | Admitting: *Deleted

## 2024-05-15 LAB — OPHTHALMOLOGY REPORT-SCANNED

## 2024-05-15 NOTE — Telephone Encounter (Signed)
  Took call and relayed per Dr. Janean last note: Continue with Keppra  250 mg twice daily I reviewed lab result note as well:     She verbalized understanding. She was working on pt pill pack and wanted to confirm dose. Nothing further needed.

## 2024-05-19 NOTE — Op Note (Signed)
 Date of procedure: April 25, 2024  Pre-operative diagnosis: Visually significant age-related combined cataract, Left Eye (H25.812)  Post-operative diagnosis:  Visually significant age-related combined cataract, Left Eye (H25.812) 2.   Pain and inflammation following cataract surgery, Left Eye (H57.12)  Procedure:  Removal of cataract via phacoemulsification and insertion of intra-ocular lens Johnson [employer] Johnson DIB00 +19.0D into the capsular bag of the Left Eye 2. Placement of Dextenza  Implant, Left Lower Lid  Attending surgeon: Lynwood LABOR. Dartagnan Beavers, MD, MA  Anesthesia: MAC, Topical Akten  Complications: None  Estimated Blood Loss: <69mL (minimal)  Specimens: None  Implants: As above  Indications:  Visually significant age-related cataract, Left Eye  Procedure:  The patient was seen and identified in the pre-operative area. The operative eye was identified and dilated.  The operative eye was marked.  Topical anesthesia was administered to the operative eye.     The patient was then to the operative suite and placed in the supine position.  A timeout was performed confirming the patient, procedure to be performed, and all other relevant information.   The patient's face was prepped and draped in the usual fashion for intra-ocular surgery.  A lid speculum was placed into the operative eye and the surgical microscope moved into place and focused.  An inferotemporal paracentesis was created using a 20 gauge paracentesis blade. Omidria was injected into the anterior chamber. Shugarcaine was injected into the anterior chamber.  Viscoelastic was injected into the anterior chamber.  A temporal clear-corneal main wound incision was created using a 2.33mm microkeratome.  A continuous curvilinear capsulorrhexis was initiated using an irrigating cystitome and completed using capsulorrhexis forceps.  Hydrodissection and hydrodeliniation were performed.  Viscoelastic was injected into the anterior chamber.   A phacoemulsification handpiece and a chopper as a second instrument were used to remove the nucleus and epinucleus. The irrigation/aspiration handpiece was used to remove any remaining cortical material.   The capsular bag was reinflated with viscoelastic, checked, and found to be intact.  The intraocular lens was inserted into the capsular bag.  The irrigation/aspiration handpiece was used to remove any remaining viscoelastic.  The clear corneal wound and paracentesis wounds were then hydrated and checked with Weck-Cels to be watertight.  0.1mL of moxifloxacin was injected into the anterior chamber.  The lid-speculum was removed. The lower punctum was dilated. A Dextenza  implant was placed in the lower canaliculus without complication.   The drape was removed.  The patient's face was cleaned with a wet and dry 4x4.   A clear shield was taped over the eye. The patient was taken to the post-operative care unit in good condition, having tolerated the procedure well.  Post-Op Instructions: The patient will follow up at [facility] for a same day post-operative evaluation and will receive all other orders and instructions.

## 2024-05-26 ENCOUNTER — Telehealth: Payer: Self-pay | Admitting: Physical Medicine and Rehabilitation

## 2024-05-26 ENCOUNTER — Encounter: Payer: Self-pay | Admitting: Radiology

## 2024-05-26 NOTE — Telephone Encounter (Signed)
Patient called. She would like another injection.

## 2024-05-27 ENCOUNTER — Other Ambulatory Visit: Payer: Self-pay | Admitting: Physical Medicine and Rehabilitation

## 2024-05-27 ENCOUNTER — Telehealth: Payer: Self-pay | Admitting: Physical Medicine and Rehabilitation

## 2024-05-27 DIAGNOSIS — M5416 Radiculopathy, lumbar region: Secondary | ICD-10-CM

## 2024-05-27 MED ORDER — DIAZEPAM 5 MG PO TABS
ORAL_TABLET | ORAL | 0 refills | Status: AC
Start: 2024-05-27 — End: ?

## 2024-05-27 NOTE — Progress Notes (Signed)
 Patients last injection was about 2 months ago. She states that the injection helped for about 6 weeks with 80%relief. The pain is the same. She's pain is on the left side around sacrum, this is the same pain. No falls or injuries.      05/26/24  3:26 PM Hi Genise  I just have a couple questions that will help us  determine if we can get you in for another injection or an office visit.    1 -Is it the same pain and location as last time? 2 -current pain level between 1-10? 3 -How long did the injection last?  4- How much relief did you receive?  5 -Any falls or trauma since last injection?    Thanks    Rosina L,RT(R) Dr. Eldonna and [facility]'s Team

## 2024-05-27 NOTE — Progress Notes (Shared)
 [facility] - Clinic Note  05/30/2024   CHIEF COMPLAINT Patient presents for No chief complaint on file.  HISTORY OF PRESENT ILLNESS: Jill [name] is a 58 y.o. female who presents to the clinic today for:     Pt states VA is stable, less floaters very few.   Referring physician: Juli Blunt, MD [address] [place],  [place] [address]  HISTORICAL INFORMATION:  Selected notes from the MEDICAL RECORD NUMBER Referred by Dr. Harrie for diabetic retinopathy eval -- new subhyaloid heme OD LEE:  Ocular Hx- PMH-   CURRENT MEDICATIONS: No current outpatient medications on file. (Ophthalmic Drugs)   No current facility-administered medications for this visit. (Ophthalmic Drugs)   Current Outpatient Medications (Other)  Medication Sig   diazepam  (VALIUM ) 5 MG tablet Take one tablet by mouth with light food one hour prior to procedure.   acetaminophen  (TYLENOL ) 500 MG tablet Take 500-1,000 mg by mouth every 8 (eight) hours as needed for moderate pain or headache.    albuterol (VENTOLIN HFA) 108 (90 Base) MCG/ACT inhaler Inhale 2 puffs into the lungs every 6 (six) hours as needed for wheezing or shortness of breath.   aspirin  EC 81 MG tablet Take 81 mg by mouth every morning.   brompheniramine-pseudoephedrine-DM 30-2-10 MG/5ML syrup Take 5 mLs by mouth 4 (four) times daily as needed.   Calcium -Phosphorus-Vitamin D (CITRACAL +D3 PO) Take 2 tablets by mouth in the morning and at bedtime.   clopidogrel  (PLAVIX ) 75 MG tablet Take 75 mg by mouth in the morning.   Cyanocobalamin (VITAMIN B-12) 5000 MCG SUBL Place 5,000 mcg under the tongue 3 (three) times a week.   diazePAM , 20 MG Dose, (VALTOCO  20 MG DOSE) 2 x 10 MG/0.1ML LQPK Place 20 mg into the nose as needed (for seizure).   docusate sodium  (COLACE) 100 MG capsule Take 100 mg by mouth in the morning.   ezetimibe  (ZETIA ) 10 MG tablet Take 1 tablet (10 mg total) by mouth daily.   felodipine  (PLENDIL ) 5 MG 24  hr tablet Take 5 mg by mouth in the morning.   furosemide  (LASIX ) 20 MG tablet TAKE ONE TABLET EVERY OTHER DAY AND ONE ADDITIONAL TABLET AS NEEDED FOR SWELLING. 15 IN PILL PACK/30 IN SEPARATE BOTTLE**   gabapentin  (NEURONTIN ) 300 MG capsule Take 300 mg by mouth 2 (two) times daily.   lamoTRIgine  (LAMICTAL ) 100 MG tablet Take 1 tablet (100 mg total) by mouth 2 (two) times daily.   levETIRAcetam  (KEPPRA ) 500 MG tablet Take 250 mg by mouth 2 (two) times daily.   linaclotide (LINZESS) 145 MCG CAPS capsule Take 145 mcg by mouth daily before breakfast.   LORazepam  (ATIVAN ) 0.5 MG tablet Take 0.5 mg by mouth daily as needed for anxiety.   metFORMIN  (GLUCOPHAGE ) 500 MG tablet Take 500 mg by mouth 2 (two) times daily.   metoprolol  succinate (TOPROL -XL) 25 MG 24 hr tablet Take 1 tablet by mouth daily   Multiple Vitamins-Minerals (BARIATRIC MULTIVITAMINS/IRON PO) Take 1 tablet by mouth every evening.   nicotine  (NICODERM CQ  - DOSED IN MG/24 HOURS) 14 mg/24hr patch RX #2 Weeks 5-6: 14 mg x 1 patch daily. Wear for 24 hours. If you have sleep disturbances, remove at bedtime.   nicotine  (NICODERM CQ  - DOSED IN MG/24 HOURS) 21 mg/24hr patch RX #1 Weeks 1-4: 21 mg x 1 patch daily. Wear for 24 hours. If you have sleep disturbances, remove at bedtime.   nicotine  (NICODERM CQ  - DOSED IN MG/24 HR) 7  mg/24hr patch RX #3 Weeks 7-8: 7 mg x 1 patch daily. Wear for 24 hours. If you have sleep disturbances, remove at bedtime.   nicotine  polacrilex (COMMIT) 4 MG lozenge RX #1 Weeks 1-6: 1 lozenge every 1-2 hours. Use at least 9 lozenges per day for the first 6 weeks. Max 20 lozenges per day.   olmesartan (BENICAR) 40 MG tablet Take 40 mg by mouth every morning.    OZEMPIC, 1 MG/DOSE, 2 MG/1.5ML SOPN Inject 2 mg into the skin once a week.   pantoprazole  (PROTONIX ) 40 MG tablet Take 1 tablet (40 mg total) by mouth 2 (two) times daily.   pioglitazone  (ACTOS ) 30 MG tablet Take 30 mg by mouth every morning.   potassium chloride   (KLOR-CON  M) 10 MEQ tablet Take 2 tablets (20 mEq total) by mouth every other day. Take on days you take Lasix .   rosuvastatin  (CRESTOR ) 40 MG tablet Take 1 tablet (40 mg total) by mouth at bedtime.   No current facility-administered medications for this visit. (Other)   REVIEW OF SYSTEMS:   ALLERGIES Allergies  Allergen Reactions   Dilaudid  [Hydromorphone  Hcl] Nausea And Vomiting   Crab Extract Swelling    Crab Cakes   Crab [Shellfish Allergy] Swelling   Hydrocodone      Don't like the way it feels   Other Other (See Comments)    pt is a difficult IV stick, prefers IV team paged for IV starts   PAST MEDICAL HISTORY Past Medical History:  Diagnosis Date   Anemia    Anxiety    Arthritis    Back pain    complains when laying on hard flat surface   Chronic kidney disease    appt. /w urology 11/10/2014- for a cyst seen on US . evaluated was told it was nothing.   Diabetes mellitus    Diabetes II, oral and GLP-1.   DVT (deep venous thrombosis) (HCC)    right leg '09 or '10   Fibroids    GERD (gastroesophageal reflux disease)    Heart murmur    History of kidney stones    History of stress test    referred for cardiac cath- done here at [facility]- 09/2014   Hypercholesterolemia    Hypertension    Neuromuscular disorder (HCC)    neuropathy   PONV (postoperative nausea and vomiting)    Seizures (HCC)    02/01/23- last seizure approximately 2015;   Sleep apnea    using cpap as of 05/23/23   Stroke [facility])    '09-had blockage in brain, too deep to operateocc left lower extremity fatique, occ. strangle, occ left mouth droopmild. may have a tendency to smile or cry with no reason has had 5 strokes last stroke 2014   Past Surgical History:  Procedure Laterality Date   APPENDECTOMY     BIOPSY  11/18/2021   Procedure: BIOPSY;  Surgeon: Eartha Angelia Sieving, MD;  [facility];  Service: Gastroenterology;;  random colon;   BIOPSY  12/27/2021   Procedure: BIOPSY;   Surgeon: Eartha Angelia Sieving, MD;  [facility];  Service: Gastroenterology;;   CARDIAC CATHETERIZATION     CATARACT EXTRACTION W/PHACO Left 04/25/2024   Procedure: PHACOEMULSIFICATION, CATARACT, WITH IOL INSERTION;  Surgeon: Harrie Agent, MD;  Location: [facility];  Service: Ophthalmology;  Laterality: Left;  CDE: 12.20   CATARACT EXTRACTION W/PHACO Right 05/09/2024   Procedure: PHACOEMULSIFICATION, CATARACT, WITH IOL INSERTION;  Surgeon: Harrie Agent, MD;  Location: [facility];  Service: Ophthalmology;  Laterality: Right;  CDE 6.57   CESAREAN SECTION  1986 & 1988   x2   CHOLECYSTECTOMY     opengallstones   COLONOSCOPY WITH PROPOFOL  N/A 11/18/2021   Procedure: COLONOSCOPY WITH PROPOFOL ;  Surgeon: Eartha Angelia Sieving, MD;  [facility];  Service: Gastroenterology;  Laterality: N/A;  1215   DILATATION & CURETTAGE/HYSTEROSCOPY WITH MYOSURE N/A 05/10/2017   Procedure: DILATATION & CURETTAGE/HYSTEROSCOPY WITH MYOSURE;  Surgeon: Rutherford Gain, MD;  Location: [facility];  Service: Gynecology;  Laterality: N/A;   ESOPHAGOGASTRODUODENOSCOPY (EGD) WITH PROPOFOL  N/A 12/27/2021   Procedure: ESOPHAGOGASTRODUODENOSCOPY (EGD) WITH PROPOFOL ;  Surgeon: Eartha Angelia Sieving, MD;  [facility];  Service: Gastroenterology;  Laterality: N/A;  100   ESOPHAGOGASTRODUODENOSCOPY (EGD) WITH PROPOFOL  N/A 01/13/2022   Procedure: ESOPHAGOGASTRODUODENOSCOPY (EGD) WITH PROPOFOL ;  Surgeon: Eartha Angelia Sieving, MD;  [facility];  Service: Gastroenterology;  Laterality: N/A;  235, per Anette Caldron pt knows to arrive at 6:15   HEMOSTASIS CLIP PLACEMENT  01/13/2022   Procedure: HEMOSTASIS CLIP PLACEMENT;  Surgeon: Eartha Angelia Sieving, MD;  [facility];  Service: Gastroenterology;;   INCISION AND DRAINAGE ABSCESS  02/23/2023   Procedure: INCISION AND DRAINAGE OF PERI RECTAL ABSCESS;  Surgeon: Teresa Lonni HERO, MD;  Location: [facility];   Service: General;;   INSERTION, STENT, DRUG-ELUTING, LACRIMAL CANALICULUS Left 04/25/2024   Procedure: INSERTION, STENT, DRUG-ELUTING, LACRIMAL CANALICULUS;  Surgeon: Harrie Agent, MD;  Location: [facility];  Service: Ophthalmology;  Laterality: Left;   INSERTION, STENT, DRUG-ELUTING, LACRIMAL CANALICULUS Right 05/09/2024   Procedure: INSERTION, STENT, DRUG-ELUTING, LACRIMAL CANALICULUS;  Surgeon: Harrie Agent, MD;  Location: [facility];  Service: Ophthalmology;  Laterality: Right;   INTERVENTIONAL RADIOLOGY PROCEDURE     CT angiography /neck -Dr. Dolphus.   IR ANGIO INTRA EXTRACRAN SEL COM CAROTID INNOMINATE BILAT MOD SED  02/22/2018   IR ANGIO INTRA EXTRACRAN SEL COM CAROTID INNOMINATE BILAT MOD SED  03/08/2022   IR ANGIO INTRA EXTRACRAN SEL COM CAROTID INNOMINATE BILAT MOD SED  09/18/2023   IR ANGIO VERTEBRAL SEL SUBCLAVIAN INNOMINATE UNI L MOD SED  09/18/2023   IR ANGIO VERTEBRAL SEL VERTEBRAL BILAT MOD SED  02/22/2018   IR ANGIO VERTEBRAL SEL VERTEBRAL UNI R MOD SED  03/08/2022   IR ANGIO VERTEBRAL SEL VERTEBRAL UNI R MOD SED  09/18/2023   IR GENERIC HISTORICAL  08/03/2016   IR RADIOLOGIST EVAL & MGMT 08/03/2016 [facility]   IR RADIOLOGIST EVAL & MGMT  01/03/2022   LAPAROSCOPIC GASTRIC SLEEVE RESECTION N/A 04/10/2016   Procedure: LAPAROSCOPIC GASTRIC SLEEVE RESECTION WITH UPPER ENDO;  Surgeon: Camellia Blush, MD;  Location: [facility];  Service: General;  Laterality: N/A;   LEFT HEART CATHETERIZATION WITH CORONARY ANGIOGRAM N/A 09/28/2014   Procedure: LEFT HEART CATHETERIZATION WITH CORONARY ANGIOGRAM;  Surgeon: Candyce GORMAN Reek, MD;  Location: [facility] C[facility];  Service: Cardiovascular;  Laterality: N/A;   LIGATION OF INTERNAL FISTULA TRACT N/A 05/24/2023   Procedure: LIGATION OF INTERSPHINCTERIC FISTULOUS TRACT;  Surgeon: Teresa Lonni HERO, MD;  Location: [facility];  Service: General;  Laterality: N/A;  90   NEPHROLITHOTOMY Right 02/01/2022   Procedure: RIGHT NEPHROLITHOTOMY PERCUTANEOUS  WITH SURGEON ACCESS;  Surgeon: Cam Morene ORN, MD;  Location: [facility];  Service: Urology;  Laterality: Right;   NEPHROLITHOTOMY Left 02/15/2022   Procedure: LEFT NEPHROLITHOTOMY PERCUTANEOUS WITH SURGEON ACCESS, REMOVAL OF RIGHT URETERAL STENT;  Surgeon: Cam Morene ORN, MD;  Location: [facility];  Service: Urology;  Laterality: Left;  195 MINUTES   PHOTOCOAGULATION WITH LASER Bilateral 11/15/2023   Procedure: PHOTOCOAGULATION, EYE, USING LASER;  Surgeon: Valdemar Rogue, MD;  Location: [facility] OR;  Service: Ophthalmology;  Laterality: Bilateral;   PLACEMENT OF SETON N/A 02/23/2023   Procedure: SURGICAL TREATMENT OF ANAL FISTULA-TRANSSPHINTERIL WITH PLACEMENT OF SETON;  Surgeon: Teresa Lonni HERO, MD;  Location: [facility];  Service: General;  Laterality: N/A;   POLYPECTOMY  01/13/2022   Procedure: POLYPECTOMY;  Surgeon: Eartha Angelia Sieving, MD;  [facility];  Service: Gastroenterology;;   RADIOLOGY WITH ANESTHESIA N/A 10/14/2014   Procedure: CYDNEY;  Surgeon: Thyra Nash, MD;  Location: [facility];  Service: Radiology;  Laterality: N/A;   RECTAL EXAM UNDER ANESTHESIA N/A 05/24/2023   Procedure: ANORECTAL EXAM UNDER ANESTHESIA;  Surgeon: Teresa Lonni HERO, MD;  Location: [facility];  Service: General;  Laterality: N/A;   TUBAL LIGATION     FAMILY HISTORY Family History  Problem Relation Age of Onset   Cataracts Mother    Kidney disease Mother        dialysis for 26 years   Heart attack Mother        51's   Diabetes Father    Cataracts Father    Heart failure Father    Heart disease Father    Heart attack Father        70's   Stroke Paternal Grandmother    SOCIAL HISTORY Social History   Tobacco Use   Smoking status: Every Day    Current packs/day: 0.25    Average packs/day: 0.3 packs/day for 48.8 years (12.2 ttl pk-yrs)    Types: Cigarettes    Start date: 07/25/1975    Passive exposure: Current   Smokeless tobacco: Never   Vaping Use   Vaping status: Never Used  Substance Use Topics   Alcohol use: Never   Drug use: No       OPHTHALMIC EXAM:  Not recorded    IMAGING AND PROCEDURES  Imaging and Procedures for 05/30/2024         ASSESSMENT/PLAN:   ICD-10-CM   1. 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 Subhyaloid hemorrhage of both eyes  H35.63     3. Long term (current) use of oral hypoglycemic drugs  Z79.84     4. Long-term (current) use of injectable non-insulin  antidiabetic drugs  Z79.85     5. Essential hypertension  I10     6. Hypertensive retinopathy of both eyes  H35.033     7. Combined forms of age-related cataract of both eyes  H25.813     8. Convulsions, unspecified convulsion type (HCC)  R56.9      1-4. Proliferative diabetic retinopathy w/o DME, OU -- new VH OD today (07.22.25) - s/p IVA OD #1 (05.23.24), #2 (06.24.24), #3 (06.24.24), #4 (08.20.24), #5 (09.17.24), #6 (10.15.24), #7 (03.14.25), #8 (08.25.25) #9 (09.26.25) - s/p IVA OS #1 (06.24.24), #2 (06.24.24), #3 (08.20.24), #4 (09.17.24), #5 (10.15.24), #6 (12.03.24), #7 (07.22.25) #8 (08.25.25) #9 (09.26.25) - s/p PRP OS (06.03.24) -- incomplete / aborted due to history of photogenic seizures - s/p PRP OU (04.25.25) -- done in the OR under GA - FA (05.23.24) shows OD: Boat shaped blockage inferior midzone from subhyaloid heme, focal NVE and vascular non-perfusion IN midzone; OS: Scattered punctate NVE greatest inferior midzone -- pt needs PRP OU - repeat FA (07.23.24): OD: low fluorescein  signal, focal  NVE and vascular non-perfusion IN midzone -- improved; OS: Low fluorescein  signal, punctate NVE greatest inferior midzone -- improved, new boat shaped heme inferior to disc - repeat FA (10.15.24) OD: focal NVE and vascular non-perfusion IN midzone -- improved w/ regression of NV, no leakage; OS: punctate NVE greatest inferior midzone  -- resolved, boat shaped heme inferior to disc- resolved - repeat FA (03.14.25) shows OD: vascular non-perfusion with interval re-development of NVE IN midzone; OS: punctate NVE greatest inferior midzone -- slightly worse, boat shaped heme inferior to disc -- resolved - repeat FA (08.25.25) shows OD: vascular non-perfusion with persistent NVE IN midzone; OS: Focal blockage from boat shaped heme inferior to disc; no obvious NV. - OCT today shows OD: Stable improvement in cystic changes ST mac--no edema, Partial PVD with stable Improvement in vitreous opacities; OS: No DME, Mild diffuse atrophy, persistent vitreous opacities--slightly improved. Partial PVD at 4+wks **recurrent NVE with stoppage of anti-VEGF therapy** - s/p PRP OU (04.25.25) in OR due to h/o photogenic seizures -- good laser changes OU - BCVA OD: 20/25 - stable, OS 20/70 from 20/60 - recommend IVA today OU #10 today, (11.07.25) w/ f/u ext to 6 weeks - RBA of procedure discussed, questions answered - IVA informed consent obtained and signed on 7.22.25 - see procedure note  - f/u 6 weeks, DFE, OCT, possible injxn  5,6. Hypertensive retinopathy OU - discussed importance of tight BP control - monitor  7. Pseudophakia OU  - s/p CE/IOL OU w/ Dr. Melany 10.2025  - IOL in good position, doing well  - under the expert management of Dr. Melany  - monitor   8. Hx of photogenic seizures  Ophthalmic Meds Ordered this visit:  No orders of the defined types were placed in this encounter.    No follow-ups on file.  There are no Patient Instructions on file for this visit.  This document serves as a record of services personally performed by Redell JUDITHANN Hans, MD, PhD. It was created on their behalf by Delon Newness COT, an ophthalmic technician. The creation of this record is the provider's dictation and/or activities during the visit.    Electronically signed by: Delon Newness COT 11.04.25  7:17 AM  This document  serves as a record of services personally performed by Redell JUDITHANN Hans, MD, PhD. It was created on their behalf by Wanda GEANNIE Keens, COT an ophthalmic technician. The creation of this record is the provider's dictation and/or activities during the visit.    Electronically signed by:  Wanda GEANNIE Keens, COT  05/30/24 7:18 AM   Abbreviations: M myopia (nearsighted); A astigmatism; H hyperopia (farsighted); P presbyopia; Mrx spectacle prescription;  CTL contact lenses; OD right eye; OS left eye; OU both eyes  XT exotropia; ET esotropia; PEK punctate epithelial keratitis; PEE punctate epithelial erosions; DES dry eye syndrome; MGD meibomian gland dysfunction; ATs artificial tears; PFAT's preservative free artificial tears; NSC nuclear sclerotic cataract; PSC posterior subcapsular cataract; ERM epi-retinal membrane; PVD posterior vitreous detachment; RD retinal detachment; DM diabetes mellitus; DR diabetic retinopathy; NPDR non-proliferative diabetic retinopathy; PDR proliferative diabetic retinopathy; CSME clinically significant macular edema; DME diabetic macular edema; dbh dot blot hemorrhages; CWS cotton wool spot; POAG primary open angle glaucoma; C/D cup-to-disc ratio; HVF humphrey visual field; GVF goldmann visual field; OCT optical coherence tomography; IOP intraocular pressure; BRVO Branch retinal vein occlusion; CRVO central retinal vein occlusion; CRAO central retinal artery occlusion; BRAO branch retinal artery occlusion; RT retinal tear; SB scleral buckle; PPV pars plana vitrectomy;  VH Vitreous hemorrhage; PRP panretinal laser photocoagulation; IVK intravitreal kenalog; VMT vitreomacular traction; MH Macular hole;  NVD neovascularization of the disc; NVE neovascularization elsewhere; AREDS age related eye disease study; ARMD age related macular degeneration; POAG primary open angle glaucoma; EBMD epithelial/anterior basement membrane dystrophy; ACIOL anterior chamber intraocular lens; IOL  intraocular lens; PCIOL posterior chamber intraocular lens; Phaco/IOL phacoemulsification with intraocular lens placement; PRK photorefractive keratectomy; LASIK laser assisted in situ keratomileusis; HTN hypertension; DM diabetes mellitus; COPD chronic obstructive pulmonary disease

## 2024-05-27 NOTE — Telephone Encounter (Signed)
 Patient called and ask if she could have something for her nerves when she have the injection with [place]. [phone]

## 2024-05-30 ENCOUNTER — Encounter (INDEPENDENT_AMBULATORY_CARE_PROVIDER_SITE_OTHER): Admitting: Ophthalmology

## 2024-05-30 DIAGNOSIS — R569 Unspecified convulsions: Secondary | ICD-10-CM

## 2024-05-30 DIAGNOSIS — Z7984 Long term (current) use of oral hypoglycemic drugs: Secondary | ICD-10-CM

## 2024-05-30 DIAGNOSIS — E113593 Type 2 diabetes mellitus with proliferative diabetic retinopathy without macular edema, bilateral: Secondary | ICD-10-CM

## 2024-05-30 DIAGNOSIS — Z7985 Long-term (current) use of injectable non-insulin antidiabetic drugs: Secondary | ICD-10-CM

## 2024-05-30 DIAGNOSIS — I10 Essential (primary) hypertension: Secondary | ICD-10-CM

## 2024-05-30 DIAGNOSIS — H35033 Hypertensive retinopathy, bilateral: Secondary | ICD-10-CM

## 2024-05-30 DIAGNOSIS — H3563 Retinal hemorrhage, bilateral: Secondary | ICD-10-CM

## 2024-05-30 DIAGNOSIS — H25813 Combined forms of age-related cataract, bilateral: Secondary | ICD-10-CM

## 2024-06-03 ENCOUNTER — Other Ambulatory Visit: Payer: Self-pay | Admitting: Cardiology

## 2024-06-16 ENCOUNTER — Encounter: Payer: Self-pay | Admitting: *Deleted

## 2024-06-16 ENCOUNTER — Telehealth: Payer: Self-pay | Admitting: Acute Care

## 2024-06-16 ENCOUNTER — Ambulatory Visit: Attending: Cardiology | Admitting: Cardiology

## 2024-06-16 ENCOUNTER — Encounter: Payer: Self-pay | Admitting: Cardiology

## 2024-06-16 VITALS — BP 134/68 | HR 68 | Ht 66.5 in | Wt 225.0 lb

## 2024-06-16 DIAGNOSIS — Z87891 Personal history of nicotine dependence: Secondary | ICD-10-CM

## 2024-06-16 DIAGNOSIS — Z122 Encounter for screening for malignant neoplasm of respiratory organs: Secondary | ICD-10-CM

## 2024-06-16 DIAGNOSIS — F17210 Nicotine dependence, cigarettes, uncomplicated: Secondary | ICD-10-CM

## 2024-06-16 DIAGNOSIS — I5032 Chronic diastolic (congestive) heart failure: Secondary | ICD-10-CM

## 2024-06-16 DIAGNOSIS — R42 Dizziness and giddiness: Secondary | ICD-10-CM

## 2024-06-16 DIAGNOSIS — E782 Mixed hyperlipidemia: Secondary | ICD-10-CM

## 2024-06-16 DIAGNOSIS — I10 Essential (primary) hypertension: Secondary | ICD-10-CM

## 2024-06-16 DIAGNOSIS — I2510 Atherosclerotic heart disease of native coronary artery without angina pectoris: Secondary | ICD-10-CM | POA: Diagnosis not present

## 2024-06-16 NOTE — Telephone Encounter (Signed)
 Lung Cancer Screening Narrative/Criteria Questionnaire (Cigarette Smokers Only- No Cigars/Pipes/vapes)   Jill [name]   SDMV:06/25/2024 2:00p [name]         [birth year]   LDCT: 07/10/2024 12:00p [facility]    58 y.o.   Phone: [phone]  Lung Screening Narrative (confirm age 67-77 yrs Medicare / 50-80 yrs Private pay insurance)   Insurance information:BCBS/mcr   Referring Provider:Dr. Alghanim    This screening involves an initial phone call with a team member from our program. It is called a shared decision making visit. The initial meeting is required by  insurance and Medicare to make sure you understand the program. This appointment takes about 15-20 minutes to complete. You will complete the screening scan at your scheduled date/time.  This scan takes about 5-10 minutes to complete. You can eat and drink normally before and after the scan.  Criteria questions for Lung Cancer Screening:   Are you a current or former smoker? Current Age began smoking: 58yo   If you are a former smoker, what year did you quit smoking? Patient quit for 3 years then started back again (within 15 yrs)   To calculate your smoking history, I need an accurate estimate of how many packs of cigarettes you smoked per day and for how many years. (Not just the number of PPD you are now smoking)   Years smoking 45 x Packs per day 3/4 = Pack years 33.75   (at least 20 pack yrs)   (Make sure they understand that we need to know how much they have smoked in the past, not just the number of PPD they are smoking now)  Do you have a personal history of cancer?  No    Do you have a family history of cancer? Yes  (cancer type and and relative) mother - breast   Are you coughing up blood?  No  Have you had unexplained weight loss of 15 lbs or more in the last 6 months? No  It looks like you meet all criteria.  When would be a good time for us  to schedule you for this screening?   Additional information: N/A

## 2024-06-16 NOTE — Patient Instructions (Addendum)
 Medication Instructions:  Continue all current medications.   Labwork: none  Testing/Procedures: none  Follow-Up: 6 months   Any Other Special Instructions Will Be Listed Below (If Applicable).   If you need a refill on your cardiac medications before your next appointment, please call your pharmacy.

## 2024-06-16 NOTE — Progress Notes (Signed)
 Clinical Summary Jill [name] is a 58 y.o.female seen today for follo wup of the following medical problems.     1. Orthostatic dizziness/syncope -previously found to be orthostatic in clinic - some episodes of lightheadedness with standing but no recurrent syncope. We had lowered her lasix  to 20mg  daily last visit, improvement but symtoms not resolved.   - last visit lowered lasix  to 20mg  every other day due to ongoing orthostatic dizziness   -episode 3 weeks ago while driving got confused, dizzy, loopy. Trouble keeping car on the road.   - rare dizziness at times.     2. Obesity - history of bariatric surgery,     3. Abnormal stress test/Chest pain/CAD - as part of a bariatric surgery preop workup she had a nuclear stress that was abnormal - follow up cath 09/2014 showed mild to moderate non-obstructive disease.    -- denies any chest pain, no SOB/DOE     4. Hyperlipidemia    11/2019 TC 166 TG 178 HDL 49 LDL 81 - more recent labs with pcp - she is on crestor  40mg  daily  Jan 2024 TC 158 TG 124 HDL 47 LDL 89 - request if more recent panel   5. HTN   -she is compliant wth meds   6. PAD - prior right lower intervention with PTA and stenting of right common iliac and right SFA in Jan 2010 -  no recent symptoms.    7. OSA - remains compliant with CPAP - followed by Dr Jude - 08/2022 sleep study mild OSA, using cpap     8. Carotid stenosis - followed by vascular. History of left ICA angioplasty 09/2014 for which she remains on ASA and plavix  for.  - from 09/2020 telephone note neuro interventional Dr Dolphus had recommended lifelong DAPT.      9. Chronic diastolic HF - last visit lowered lasix  to 20mg  every other day due to ongoing orthostatic dizziness - no recent edema - wears compression stocking.s Past Medical History:  Diagnosis Date   Anemia    Anxiety    Arthritis    Back pain    complains when laying on hard flat surface   Chronic kidney disease     appt. /w urology 11/10/2014- for a cyst seen on US . evaluated was told it was nothing.   Diabetes mellitus    Diabetes II, oral and GLP-1.   DVT (deep venous thrombosis) (HCC)    right leg '09 or '10   Fibroids    GERD (gastroesophageal reflux disease)    Heart murmur    History of kidney stones    History of stress test    referred for cardiac cath- done here at [facility]- 09/2014   Hypercholesterolemia    Hypertension    Neuromuscular disorder (HCC)    neuropathy   PONV (postoperative nausea and vomiting)    Seizures (HCC)    02/01/23- last seizure approximately 2015;   Sleep apnea    using cpap as of 05/23/23   Stroke [facility])    '09-had blockage in brain, too deep to operateocc left lower extremity fatique, occ. strangle, occ left mouth droopmild. may have a tendency to smile or cry with no reason has had 5 strokes last stroke 2014     Allergies  Allergen Reactions   Dilaudid  [Hydromorphone  Hcl] Nausea And Vomiting   Crab Extract Swelling    Crab Cakes   Crab [Shellfish Allergy] Swelling   Hydrocodone   Don't like the way it feels   Other Other (See Comments)    pt is a difficult IV stick, prefers IV team paged for IV starts     Current Outpatient Medications  Medication Sig Dispense Refill   diazepam  (VALIUM ) 5 MG tablet Take one tablet by mouth with light food one hour prior to procedure. 1 tablet 0   acetaminophen  (TYLENOL ) 500 MG tablet Take 500-1,000 mg by mouth every 8 (eight) hours as needed for moderate pain or headache.      albuterol  (VENTOLIN  HFA) 108 (90 Base) MCG/ACT inhaler Inhale 2 puffs into the lungs every 6 (six) hours as needed for wheezing or shortness of breath. 8 g 6   aspirin  EC 81 MG tablet Take 81 mg by mouth every morning.     brompheniramine-pseudoephedrine-DM 30-2-10 MG/5ML syrup Take 5 mLs by mouth 4 (four) times daily as needed. 140 mL 0   Calcium -Phosphorus-Vitamin D (CITRACAL +D3 PO) Take 2 tablets by mouth in the morning and at  bedtime.     clopidogrel  (PLAVIX ) 75 MG tablet Take 75 mg by mouth in the morning.     Cyanocobalamin (VITAMIN B-12) 5000 MCG SUBL Place 5,000 mcg under the tongue 3 (three) times a week.     diazePAM , 20 MG Dose, (VALTOCO  20 MG DOSE) 2 x 10 MG/0.1ML LQPK Place 20 mg into the nose as needed (for seizure). 1 each 0   docusate sodium  (COLACE) 100 MG capsule Take 100 mg by mouth in the morning.     ezetimibe  (ZETIA ) 10 MG tablet Take 1 tablet (10 mg total) by mouth daily. 90 tablet 3   felodipine  (PLENDIL ) 5 MG 24 hr tablet Take 5 mg by mouth in the morning.     furosemide  (LASIX ) 20 MG tablet TAKE ONE TABLET EVERY OTHER DAY AND ONE ADDITIONAL TABLET AS NEEDED FOR SWELLING. 15 IN PILL PACK/30 IN SEPARATE BOTTLE** 90 tablet 1   gabapentin  (NEURONTIN ) 300 MG capsule Take 300 mg by mouth 2 (two) times daily.  0   lamoTRIgine  (LAMICTAL ) 100 MG tablet Take 1 tablet (100 mg total) by mouth 2 (two) times daily. 180 tablet 3   levETIRAcetam  (KEPPRA ) 500 MG tablet Take 250 mg by mouth 2 (two) times daily.     linaclotide (LINZESS) 145 MCG CAPS capsule Take 145 mcg by mouth daily before breakfast.     LORazepam  (ATIVAN ) 0.5 MG tablet Take 0.5 mg by mouth daily as needed for anxiety.     metFORMIN  (GLUCOPHAGE ) 500 MG tablet Take 500 mg by mouth 2 (two) times daily.     metoprolol  succinate (TOPROL -XL) 25 MG 24 hr tablet Take 1 tablet by mouth daily 90 tablet 2   Multiple Vitamins-Minerals (BARIATRIC MULTIVITAMINS/IRON PO) Take 1 tablet by mouth every evening.     nicotine  (NICODERM CQ  - DOSED IN MG/24 HOURS) 14 mg/24hr patch RX #2 Weeks 5-6: 14 mg x 1 patch daily. Wear for 24 hours. If you have sleep disturbances, remove at bedtime. 14 patch 0   nicotine  (NICODERM CQ  - DOSED IN MG/24 HOURS) 21 mg/24hr patch RX #1 Weeks 1-4: 21 mg x 1 patch daily. Wear for 24 hours. If you have sleep disturbances, remove at bedtime. 28 patch 0   nicotine  (NICODERM CQ  - DOSED IN MG/24 HR) 7 mg/24hr patch RX #3 Weeks 7-8: 7 mg x 1  patch daily. Wear for 24 hours. If you have sleep disturbances, remove at bedtime. 14 patch 0   nicotine  polacrilex (COMMIT) 4 MG  lozenge RX #1 Weeks 1-6: 1 lozenge every 1-2 hours. Use at least 9 lozenges per day for the first 6 weeks. Max 20 lozenges per day. 1008 lozenge 0   olmesartan (BENICAR) 40 MG tablet Take 40 mg by mouth every morning.      OZEMPIC, 1 MG/DOSE, 2 MG/1.5ML SOPN Inject 2 mg into the skin once a week.     pantoprazole  (PROTONIX ) 40 MG tablet Take 1 tablet (40 mg total) by mouth 2 (two) times daily. 60 tablet 2   pioglitazone  (ACTOS ) 30 MG tablet Take 30 mg by mouth every morning.     potassium chloride  (KLOR-CON  M) 10 MEQ tablet Take 2 tablets (20 mEq total) by mouth every other day. Take on days you take Lasix . 90 tablet 3   rosuvastatin  (CRESTOR ) 40 MG tablet Take 1 tablet (40 mg total) by mouth at bedtime. 90 tablet 1   No current facility-administered medications for this visit.     Past Surgical History:  Procedure Laterality Date   APPENDECTOMY     BIOPSY  11/18/2021   Procedure: BIOPSY;  Surgeon: Eartha Angelia Sieving, MD;  [facility];  Service: Gastroenterology;;  random colon;   BIOPSY  12/27/2021   Procedure: BIOPSY;  Surgeon: Eartha Angelia Sieving, MD;  [facility];  Service: Gastroenterology;;   CARDIAC CATHETERIZATION     CATARACT EXTRACTION W/PHACO Left 04/25/2024   Procedure: PHACOEMULSIFICATION, CATARACT, WITH IOL INSERTION;  Surgeon: Harrie Agent, MD;  Location: [facility];  Service: Ophthalmology;  Laterality: Left;  CDE: 12.20   CATARACT EXTRACTION W/PHACO Right 05/09/2024   Procedure: PHACOEMULSIFICATION, CATARACT, WITH IOL INSERTION;  Surgeon: Harrie Agent, MD;  Location: [facility];  Service: Ophthalmology;  Laterality: Right;  CDE 6.57   CESAREAN SECTION  1986 & 1988   x2   CHOLECYSTECTOMY     opengallstones   COLONOSCOPY WITH PROPOFOL  N/A 11/18/2021   Procedure: COLONOSCOPY WITH PROPOFOL ;  Surgeon: Eartha Angelia Sieving, MD;  [facility];  Service: Gastroenterology;  Laterality: N/A;  1215   DILATATION & CURETTAGE/HYSTEROSCOPY WITH MYOSURE N/A 05/10/2017   Procedure: DILATATION & CURETTAGE/HYSTEROSCOPY WITH MYOSURE;  Surgeon: Rutherford Gain, MD;  Location: [facility];  Service: Gynecology;  Laterality: N/A;   ESOPHAGOGASTRODUODENOSCOPY (EGD) WITH PROPOFOL  N/A 12/27/2021   Procedure: ESOPHAGOGASTRODUODENOSCOPY (EGD) WITH PROPOFOL ;  Surgeon: Eartha Angelia Sieving, MD;  [facility];  Service: Gastroenterology;  Laterality: N/A;  100   ESOPHAGOGASTRODUODENOSCOPY (EGD) WITH PROPOFOL  N/A 01/13/2022   Procedure: ESOPHAGOGASTRODUODENOSCOPY (EGD) WITH PROPOFOL ;  Surgeon: Eartha Angelia Sieving, MD;  [facility];  Service: Gastroenterology;  Laterality: N/A;  235, per Anette Caldron pt knows to arrive at 6:15   HEMOSTASIS CLIP PLACEMENT  01/13/2022   Procedure: HEMOSTASIS CLIP PLACEMENT;  Surgeon: Eartha Angelia Sieving, MD;  [facility];  Service: Gastroenterology;;   INCISION AND DRAINAGE ABSCESS  02/23/2023   Procedure: INCISION AND DRAINAGE OF PERI RECTAL ABSCESS;  Surgeon: Teresa Lonni HERO, MD;  Location: [facility];  Service: General;;   INSERTION, STENT, DRUG-ELUTING, LACRIMAL CANALICULUS Left 04/25/2024   Procedure: INSERTION, STENT, DRUG-ELUTING, LACRIMAL CANALICULUS;  Surgeon: Harrie Agent, MD;  Location: [facility];  Service: Ophthalmology;  Laterality: Left;   INSERTION, STENT, DRUG-ELUTING, LACRIMAL CANALICULUS Right 05/09/2024   Procedure: INSERTION, STENT, DRUG-ELUTING, LACRIMAL CANALICULUS;  Surgeon: Harrie Agent, MD;  Location: [facility];  Service: Ophthalmology;  Laterality: Right;   INTERVENTIONAL RADIOLOGY PROCEDURE     CT angiography /neck -Dr. Dolphus.  IR ANGIO INTRA EXTRACRAN SEL COM CAROTID INNOMINATE BILAT MOD SED  02/22/2018   IR ANGIO INTRA EXTRACRAN SEL COM CAROTID INNOMINATE BILAT MOD SED  03/08/2022   IR ANGIO  INTRA EXTRACRAN SEL COM CAROTID INNOMINATE BILAT MOD SED  09/18/2023   IR ANGIO VERTEBRAL SEL SUBCLAVIAN INNOMINATE UNI L MOD SED  09/18/2023   IR ANGIO VERTEBRAL SEL VERTEBRAL BILAT MOD SED  02/22/2018   IR ANGIO VERTEBRAL SEL VERTEBRAL UNI R MOD SED  03/08/2022   IR ANGIO VERTEBRAL SEL VERTEBRAL UNI R MOD SED  09/18/2023   IR GENERIC HISTORICAL  08/03/2016   IR RADIOLOGIST EVAL & MGMT 08/03/2016 [facility]   IR RADIOLOGIST EVAL & MGMT  01/03/2022   LAPAROSCOPIC GASTRIC SLEEVE RESECTION N/A 04/10/2016   Procedure: LAPAROSCOPIC GASTRIC SLEEVE RESECTION WITH UPPER ENDO;  Surgeon: Camellia Blush, MD;  Location: [facility];  Service: General;  Laterality: N/A;   LEFT HEART CATHETERIZATION WITH CORONARY ANGIOGRAM N/A 09/28/2014   Procedure: LEFT HEART CATHETERIZATION WITH CORONARY ANGIOGRAM;  Surgeon: Candyce GORMAN Reek, MD;  Location: [facility] CATH [facility];  Service: Cardiovascular;  Laterality: N/A;   LIGATION OF INTERNAL FISTULA TRACT N/A 05/24/2023   Procedure: LIGATION OF INTERSPHINCTERIC FISTULOUS TRACT;  Surgeon: Teresa Lonni HERO, MD;  Location: [facility];  Service: General;  Laterality: N/A;  90   NEPHROLITHOTOMY Right 02/01/2022   Procedure: RIGHT NEPHROLITHOTOMY PERCUTANEOUS WITH SURGEON ACCESS;  Surgeon: Cam Morene ORN, MD;  Location: [facility];  Service: Urology;  Laterality: Right;   NEPHROLITHOTOMY Left 02/15/2022   Procedure: LEFT NEPHROLITHOTOMY PERCUTANEOUS WITH SURGEON ACCESS, REMOVAL OF RIGHT URETERAL STENT;  Surgeon: Cam Morene ORN, MD;  Location: [facility];  Service: Urology;  Laterality: Left;  195 MINUTES   PHOTOCOAGULATION WITH LASER Bilateral 11/15/2023   Procedure: PHOTOCOAGULATION, EYE, USING LASER;  Surgeon: Valdemar Rogue, MD;  Location: [facility] OR;  Service: Ophthalmology;  Laterality: Bilateral;   PLACEMENT OF SETON N/A 02/23/2023   Procedure: SURGICAL TREATMENT OF ANAL FISTULA-TRANSSPHINTERIL WITH PLACEMENT OF SETON;  Surgeon: Teresa Lonni HERO, MD;  Location: [facility];  Service: General;  Laterality: N/A;   POLYPECTOMY  01/13/2022   Procedure: POLYPECTOMY;  Surgeon: Eartha Angelia Sieving, MD;  [facility];  Service: Gastroenterology;;   RADIOLOGY WITH ANESTHESIA N/A 10/14/2014   Procedure: CYDNEY;  Surgeon: Thyra Nash, MD;  Location: [facility];  Service: Radiology;  Laterality: N/A;   RECTAL EXAM UNDER ANESTHESIA N/A 05/24/2023   Procedure: ANORECTAL EXAM UNDER ANESTHESIA;  Surgeon: Teresa Lonni HERO, MD;  Location: [facility];  Service: General;  Laterality: N/A;   TUBAL LIGATION       Allergies  Allergen Reactions   Dilaudid  [Hydromorphone  Hcl] Nausea And Vomiting   Crab Extract Swelling    Crab Cakes   Crab [Shellfish Allergy] Swelling   Hydrocodone      Don't like the way it feels   Other Other (See Comments)    pt is a difficult IV stick, prefers IV team paged for IV starts      Family History  Problem Relation Age of Onset   Cataracts Mother    Kidney disease Mother        dialysis for 26 years   Heart attack Mother        79's   Diabetes Father    Cataracts Father    Heart failure Father    Heart disease Father    Heart attack Father        3's  Stroke Paternal Grandmother      Social History Jill [name] reports that she has been smoking cigarettes. She started smoking about 48 years ago. She has a 12.2 pack-year smoking history. She has been exposed to tobacco smoke. She has never used smokeless tobacco. Jill [name] reports no history of alcohol use.     Physical Examination Today's Vitals   06/16/24 1134  BP: 134/68  Pulse: 68  SpO2: 97%  Weight: 225 lb (102.1 kg)  Height: 5' 6.5 (1.689 m)   Body mass index is 35.77 kg/m.  Gen: resting comfortably, no acute distress HEENT: no scleral icterus, pupils equal round and reactive, no palptable cervical adenopathy,  CV: RRR, no m/rg, no jvd Resp: Clear to auscultation bilaterally GI: abdomen is soft,  non-tender, non-distended, normal bowel sounds, no hepatosplenomegaly MSK: extremities are warm, no edema.  Skin: warm, no rash Neuro:  no focal deficits Psych: appropriate affect   Diagnostic Studies  08/2014 Lexiscan  MPI IMPRESSION: 1. Large area of ischemia extending from the basal to distal anteroseptal wall.   2. Normal left ventricular wall motion.   3. Left ventricular ejection fraction 60 for%   4. High-risk stress test findings*.   09/2014 Cath HEMODYNAMICS:  Aortic pressure was 142/66; LV pressure was 144/7; LVEDP 18.  There was no gradient between the left ventricle and aorta.     ANGIOGRAPHIC DATA:   The left main coronary artery is widely patent.   The left anterior descending artery is a large vessel which wraps around the apex. There is mild atherosclerosis in the mid vessel. There is a large diagonal vessel which arises proximally. There are several small diagonals which are patent.   The left circumflex artery is a large dominant vessel. There is a ramus vessel which is large and widely patent. There is mild disease in the circumflex system. The first obtuse marginal is small but patent.  There is a small left PDA which is patent.   The right coronary artery is a nondominant vessel. In the mid vessel, there is moderate disease.   LEFT VENTRICULOGRAM:  Left ventricular angiogram was not done due to the question of a dye allergy.  LVEDP was 18 mmHg.   IMPRESSIONS:    Normal left main coronary artery. Mild disease in the left anterior descending artery and its branches. Mild disease in the dominant left circumflex artery and its branches. Moderate disease in the nondominant right coronary artery. Left ventricular systolic function not assessed.  LVEDP 18 mmHg.  6.   False positive nuclear stress test.         Assessment and Plan    1. Orthostatic dizziness/syncope - doing well, no recent symptoms - continue to monitor   2. HTN -at goal, continue  current meds     3. HLD - we will request labs from pcp   4. CAD - of note DAPT is for prior cartotid stenting per Dr Dolphus lifelong therapy, DAPT is not for a cardiac reason.  - no cardiac symptoms, continue to monitor   5. Chronic diastolic HF -  we prevoiusly lowered lasix  due to orthostatic symptoms - euvolemic, continue current meds       Dorn PHEBE Ross, M.D.

## 2024-06-23 ENCOUNTER — Other Ambulatory Visit: Payer: Self-pay

## 2024-06-23 ENCOUNTER — Ambulatory Visit: Admitting: Physical Medicine and Rehabilitation

## 2024-06-23 VITALS — BP 169/91 | HR 86

## 2024-06-23 DIAGNOSIS — M5416 Radiculopathy, lumbar region: Secondary | ICD-10-CM | POA: Diagnosis not present

## 2024-06-23 MED ORDER — METHYLPREDNISOLONE ACETATE 40 MG/ML IJ SUSP
40.0000 mg | Freq: Once | INTRAMUSCULAR | Status: AC
  Administered 2024-06-23: 40 mg

## 2024-06-23 NOTE — Progress Notes (Signed)
 sendPain Scale   Average Pain 8 Patient advising she has chronic lower back pain radiating to left leg and at times to right leg, pain increases when standing and walking an decreases when she applies pressure to her lower back.         +Driver, -BT, -Dye Allergies.

## 2024-06-23 NOTE — Progress Notes (Signed)
 Jill [name] - 58 y.o. female MRN [id number]  Date of birth: 09-02-1965  Office Visit Note: Visit Date: 06/23/2024 PCP: Leigh Lung, MD Referred by: Leigh Lung, MD  Subjective: Chief Complaint  Patient presents with   Lower Back - Pain   HPI:  Jill [name] is a 58 y.o. female who comes in today for planned repeat Left L4-5  Lumbar Transforaminal epidural steroid injection with fluoroscopic guidance.  The patient has failed conservative care including home exercise, medications, time and activity modification.  This injection will be diagnostic and hopefully therapeutic.  Please see requesting physician notes for further details and justification. Patient received more than 50% pain relief from prior injection.     Referring: Ozell Ada, MD   ROS Otherwise per HPI.  Assessment & Plan: Visit Diagnoses:    ICD-10-CM   1. Lumbar radiculopathy  M54.16 XR C-ARM NO REPORT    Epidural Steroid injection    methylPREDNISolone  acetate (DEPO-MEDROL ) injection 40 mg      Plan: No additional findings.   Meds & Orders:  Meds ordered this encounter  Medications   methylPREDNISolone  acetate (DEPO-MEDROL ) injection 40 mg    Orders Placed This Encounter  Procedures   XR C-ARM NO REPORT   Epidural Steroid injection    Follow-up: Return for visit to requesting provider as needed.   Procedures: No procedures performed  Lumbosacral Transforaminal Epidural Steroid Injection - Sub-Pedicular Approach with Fluoroscopic Guidance  Patient: Jill [name]      Date of Birth: 03/15/1966 MRN: [id number] PCP: Leigh Lung, MD      Visit Date: 06/23/2024   Universal Protocol:    Date/Time: 06/23/2024  Consent Given By: the patient  Position: PRONE  Additional Comments: Vital signs were monitored before and after the procedure. Patient was prepped and draped in the usual sterile fashion. The correct patient, procedure, and site was verified.   Injection Procedure Details:    Procedure diagnoses: Lumbar radiculopathy [M54.16]    Meds Administered:  Meds ordered this encounter  Medications   methylPREDNISolone  acetate (DEPO-MEDROL ) injection 40 mg    Laterality: Left  Location/Site: L4  Needle:5.0 in., 22 ga.  Short bevel or Quincke spinal needle  Needle Placement: Transforaminal  Findings:    -Comments: Excellent flow of contrast along the nerve, nerve root and into the epidural space.  Procedure Details: After squaring off the end-plates to get a true AP view, the C-arm was positioned so that an oblique view of the foramen as noted above was visualized. The target area is just inferior to the nose of the scotty dog or sub pedicular. The soft tissues overlying this structure were infiltrated with 2-3 ml. of 1% Lidocaine  without Epinephrine .  The spinal needle was inserted toward the target using a trajectory view along the fluoroscope beam.  Under AP and lateral visualization, the needle was advanced so it did not puncture dura and was located close the 6 O'Clock position of the pedical in AP tracterory. Biplanar projections were used to confirm position. Aspiration was confirmed to be negative for CSF and/or blood. A 1-2 ml. volume of Isovue -250 was injected and flow of contrast was noted at each level. Radiographs were obtained for documentation purposes.   After attaining the desired flow of contrast documented above, a 0.5 to 1.0 ml test dose of 0.25% Marcaine  was injected into each respective transforaminal space.  The patient was observed for 90 seconds post injection.  After no sensory deficits were reported, and normal lower  extremity motor function was noted,   the above injectate was administered so that equal amounts of the injectate were placed at each foramen (level) into the transforaminal epidural space.   Additional Comments:  The patient tolerated the procedure well Dressing: 2 x 2 sterile gauze and Band-Aid    Post-procedure  details: Patient was observed during the procedure. Post-procedure instructions were reviewed.  Patient left the clinic in stable condition.    Clinical History: MRI LUMBAR SPINE WITHOUT CONTRAST   TECHNIQUE: Multiplanar, multisequence MR imaging of the lumbar spine was performed. No intravenous contrast was administered.   COMPARISON:  Radiography 07/06/2023   FINDINGS: Segmentation:  5 lumbar type vertebral bodies.   Alignment: Mild scoliotic curvature convex to the right. 2 mm degenerative anterolisthesis at L3-4 and L4-5.   Vertebrae: No vertebral body fracture or focal lesion. Edematous facet arthritic changes at L3-4 and L4-5.   Conus medullaris and cauda equina: Conus extends to the L1-2 level. Conus and cauda equina appear normal.   Paraspinal and other soft tissues: Negative   Disc levels:   No significant finding at L2-3 or above.   L3-4: Bilateral facet degeneration and hypertrophy with 2 mm of degenerative anterolisthesis. Mild bulging of the disc. Prominent epidural fat. Mild multifactorial stenosis at this level that could be symptomatic. The facet arthropathy would also likely be painful.   L4-5: Bilateral facet arthropathy with gaping, fluid-filled joints. Anterolisthesis of 2 mm it would likely worsen with standing or flexion. Shallow protrusion of the disc. Moderate multifactorial stenosis that would likely worsen with standing or flexion and could cause neural compression on either or both sides. The facet arthritis would also likely be painful.   L5-S1: Minimal disc bulge.  No canal or foraminal stenosis.   IMPRESSION: 1. L3-4: Bilateral facet degeneration and hypertrophy with 2 mm of degenerative anterolisthesis. Mild bulging of the disc. Prominent epidural fat. Mild multifactorial stenosis that could be symptomatic. The facet arthropathy would also likely be painful. 2. L4-5: Bilateral facet arthropathy with gaping, fluid-filled joints.  Anterolisthesis of 2 mm that would likely worsen with standing or flexion. Shallow protrusion of the disc. Moderate multifactorial stenosis that would likely worsen with standing or flexion and could cause neural compression on either or both sides. The facet arthritis would also likely be painful.     Electronically Signed   By: Oneil Officer M.D.   On: 10/24/2023 13:44     Objective:  VS:  HT:    WT:   BMI:     BP:(!) 169/91  HR:86bpm  TEMP: ( )  RESP:  Physical Exam Vitals and nursing note reviewed.  Constitutional:      General: She is not in acute distress.    Appearance: Normal appearance. She is obese. She is not ill-appearing.  HENT:     Head: Normocephalic and atraumatic.     Right Ear: External ear normal.     Left Ear: External ear normal.  Eyes:     Extraocular Movements: Extraocular movements intact.  Cardiovascular:     Rate and Rhythm: Normal rate.     Pulses: Normal pulses.  Pulmonary:     Effort: Pulmonary effort is normal. No respiratory distress.  Abdominal:     General: There is no distension.     Palpations: Abdomen is soft.  Musculoskeletal:        General: Tenderness present.     Cervical back: Neck supple.     Right lower leg: No edema.  Left lower leg: No edema.     Comments: Patient has good distal strength with no pain over the greater trochanters.  No clonus or focal weakness.  Skin:    Findings: No erythema, lesion or rash.  Neurological:     General: No focal deficit present.     Mental Status: She is alert and oriented to person, place, and time.     Sensory: No sensory deficit.     Motor: No weakness or abnormal muscle tone.     Coordination: Coordination normal.  Psychiatric:        Mood and Affect: Mood normal.        Behavior: Behavior normal.      Imaging: No results found.

## 2024-06-23 NOTE — Procedures (Signed)
 Lumbosacral Transforaminal Epidural Steroid Injection - Sub-Pedicular Approach with Fluoroscopic Guidance  Patient: Jill [name]      Date of Birth: [birth year] MRN: [id number] PCP: Leigh Lung, MD      Visit Date: 06/23/2024   Universal Protocol:    Date/Time: 06/23/2024  Consent Given By: the patient  Position: PRONE  Additional Comments: Vital signs were monitored before and after the procedure. Patient was prepped and draped in the usual sterile fashion. The correct patient, procedure, and site was verified.   Injection Procedure Details:   Procedure diagnoses: Lumbar radiculopathy [M54.16]    Meds Administered:  Meds ordered this encounter  Medications   methylPREDNISolone  acetate (DEPO-MEDROL ) injection 40 mg    Laterality: Left  Location/Site: L4  Needle:5.0 in., 22 ga.  Short bevel or Quincke spinal needle  Needle Placement: Transforaminal  Findings:    -Comments: Excellent flow of contrast along the nerve, nerve root and into the epidural space.  Procedure Details: After squaring off the end-plates to get a true AP view, the C-arm was positioned so that an oblique view of the foramen as noted above was visualized. The target area is just inferior to the nose of the scotty dog or sub pedicular. The soft tissues overlying this structure were infiltrated with 2-3 ml. of 1% Lidocaine  without Epinephrine .  The spinal needle was inserted toward the target using a trajectory view along the fluoroscope beam.  Under AP and lateral visualization, the needle was advanced so it did not puncture dura and was located close the 6 O'Clock position of the pedical in AP tracterory. Biplanar projections were used to confirm position. Aspiration was confirmed to be negative for CSF and/or blood. A 1-2 ml. volume of Isovue -250 was injected and flow of contrast was noted at each level. Radiographs were obtained for documentation purposes.   After attaining the desired flow of  contrast documented above, a 0.5 to 1.0 ml test dose of 0.25% Marcaine  was injected into each respective transforaminal space.  The patient was observed for 90 seconds post injection.  After no sensory deficits were reported, and normal lower extremity motor function was noted,   the above injectate was administered so that equal amounts of the injectate were placed at each foramen (level) into the transforaminal epidural space.   Additional Comments:  The patient tolerated the procedure well Dressing: 2 x 2 sterile gauze and Band-Aid    Post-procedure details: Patient was observed during the procedure. Post-procedure instructions were reviewed.  Patient left the clinic in stable condition.

## 2024-06-25 ENCOUNTER — Encounter: Payer: Self-pay | Admitting: *Deleted

## 2024-06-25 ENCOUNTER — Ambulatory Visit

## 2024-06-25 DIAGNOSIS — F17210 Nicotine dependence, cigarettes, uncomplicated: Secondary | ICD-10-CM

## 2024-06-25 NOTE — Patient Instructions (Signed)

## 2024-06-25 NOTE — Progress Notes (Signed)
 Virtual Visit via Video Note  I connected with Jill [name] on 06/25/24 at  2:00 PM EST by a video enabled telemedicine application and verified that I am speaking with the correct person using two identifiers.  Location: Patient: in home Provider: [address], [place], [place], [address]     Shared Decision Making Visit Lung Cancer Screening Program [phone])   Eligibility: Age 58 y.o. Pack Years Smoking History Calculation 33.75 (# packs/per year x # years smoked) Recent History of coughing up blood  no Unexplained weight loss? no ( >Than 15 pounds within the last 6 months ) Prior History Lung / other cancer no (Diagnosis within the last 5 years already requiring surveillance chest CT Scans). Smoking Status Current Smoker Former Smokers: Years since quit:  NA  Quit Date: NA  Visit Components: Discussion included one or more decision making aids. yes Discussion included risk/benefits of screening. yes Discussion included potential follow up diagnostic testing for abnormal scans. yes Discussion included meaning and risk of over diagnosis. yes Discussion included meaning and risk of False Positives. yes Discussion included meaning of total radiation exposure. yes  Counseling Included: Importance of adherence to annual lung cancer LDCT screening. yes Impact of comorbidities on ability to participate in the program. yes Ability and willingness to under diagnostic treatment. yes  Smoking Cessation Counseling: Current Smokers:  Discussed importance of smoking cessation. yes Information about tobacco cessation classes and interventions provided to patient. yes Patient provided with ticket for LDCT Scan. yes Symptomatic Patient. no  Counseling NA Diagnosis Code: Tobacco Use Z72.0 Asymptomatic Patient yes  Counseling (Intermediate counseling: > three minutes counseling) [id number]  Counseled patient 4 minutes regarding tobacco use.   Former Smokers:  Discussed the  importance of maintaining cigarette abstinence. yes Diagnosis Code: Personal History of Nicotine  Dependence. S12.108 Information about tobacco cessation classes and interventions provided to patient. Yes Patient provided with ticket for LDCT Scan. yes Written Order for Lung Cancer Screening with LDCT placed in Epic. Yes (CT Chest Lung Cancer Screening Low Dose W/O CM) [id number] Z12.2-Screening of respiratory organs Z87.891-Personal history of nicotine  dependence   Josette Ranger, RN  06/25/24

## 2024-07-10 ENCOUNTER — Ambulatory Visit (HOSPITAL_COMMUNITY): Admission: RE | Admit: 2024-07-10 | Discharge: 2024-07-10 | Attending: Acute Care | Admitting: Acute Care

## 2024-07-10 ENCOUNTER — Ambulatory Visit (HOSPITAL_COMMUNITY): Admission: RE | Admit: 2024-07-10 | Discharge: 2024-07-10 | Attending: Pulmonary Disease | Admitting: Pulmonary Disease

## 2024-07-10 DIAGNOSIS — F17210 Nicotine dependence, cigarettes, uncomplicated: Secondary | ICD-10-CM | POA: Insufficient documentation

## 2024-07-10 DIAGNOSIS — R053 Chronic cough: Secondary | ICD-10-CM | POA: Diagnosis present

## 2024-07-10 DIAGNOSIS — Z87891 Personal history of nicotine dependence: Secondary | ICD-10-CM | POA: Diagnosis present

## 2024-07-10 DIAGNOSIS — Z122 Encounter for screening for malignant neoplasm of respiratory organs: Secondary | ICD-10-CM | POA: Insufficient documentation

## 2024-07-10 DIAGNOSIS — F17200 Nicotine dependence, unspecified, uncomplicated: Secondary | ICD-10-CM | POA: Insufficient documentation

## 2024-07-10 LAB — PULMONARY FUNCTION TEST
DL/VA % pred: 85 %
DL/VA: 3.53 ml/min/mmHg/L
DLCO unc % pred: 75 %
DLCO unc: 16.57 ml/min/mmHg
FEF 25-75 Pre: 2.78 L/s
FEF2575-%Pred-Pre: 109 %
FEV1-%Pred-Pre: 99 %
FEV1-Pre: 2.82 L
FEV1FVC-%Pred-Pre: 102 %
FEV6-%Pred-Pre: 99 %
FEV6-Pre: 3.51 L
FEV6FVC-%Pred-Pre: 103 %
FVC-%Pred-Pre: 96 %
FVC-Pre: 3.53 L
Pre FEV1/FVC ratio: 80 %
Pre FEV6/FVC Ratio: 100 %
RV % pred: 122 %
RV: 2.57 L
TLC % pred: 110 %
TLC: 6.01 L

## 2024-07-21 ENCOUNTER — Other Ambulatory Visit: Payer: Self-pay

## 2024-07-21 ENCOUNTER — Telehealth: Payer: Self-pay

## 2024-07-21 DIAGNOSIS — F17210 Nicotine dependence, cigarettes, uncomplicated: Secondary | ICD-10-CM

## 2024-07-21 DIAGNOSIS — Z87891 Personal history of nicotine dependence: Secondary | ICD-10-CM

## 2024-07-21 DIAGNOSIS — Z122 Encounter for screening for malignant neoplasm of respiratory organs: Secondary | ICD-10-CM

## 2024-07-21 NOTE — Telephone Encounter (Signed)
 Spoke with patient and reviewed recent Lung CT results. She will complete an annual Lung CT again next year. Order placed. Pt will follow up with PCP and discuss ordering an US  for a new 1.6 cm thyroid  nodule.  Results and plan sent to Dr. Leigh for follow up. She is on statin therapy.   IMPRESSION: 1. Lung-RADS 2S, benign appearance or behavior. Continue annual screening with low-dose chest CT without contrast in 12 months. 2. The S modifier above refers to potentially clinically significant non lung cancer related findings. Specifically, there is a 1.6 cm right thyroid  nodule. Recommend thyroid  US  (ref: J Am Coll Radiol. 2015 Feb;12(2): 143-50). 3. Partially imaged right renal stone. 4. Aortic Atherosclerosis (ICD10-I70.0). Coronary artery calcifications. Assessment for potential risk factor modification, dietary therapy or pharmacologic therapy may be warranted, if clinically indicated.

## 2024-08-11 ENCOUNTER — Telehealth: Payer: Self-pay | Admitting: Physical Medicine and Rehabilitation

## 2024-08-11 NOTE — Telephone Encounter (Signed)
 Pt called requesting another injection with [place]. Last injection 06/23/24. Pt number is [phone].

## 2024-12-08 ENCOUNTER — Ambulatory Visit: Admitting: Neurology
